# Patient Record
Sex: Male | Born: 1945 | Race: White | Hispanic: No | Smoking: Former smoker
Health system: Southern US, Community
[De-identification: ages and names within clinical notes are randomized; demographics above are authoritative.]

## PROBLEM LIST (undated history)

## (undated) DIAGNOSIS — I4891 Unspecified atrial fibrillation: Secondary | ICD-10-CM

## (undated) DIAGNOSIS — K8020 Calculus of gallbladder without cholecystitis without obstruction: Secondary | ICD-10-CM

## (undated) DIAGNOSIS — C189 Malignant neoplasm of colon, unspecified: Secondary | ICD-10-CM

## (undated) DIAGNOSIS — K766 Portal hypertension: Secondary | ICD-10-CM

## (undated) DIAGNOSIS — C801 Malignant (primary) neoplasm, unspecified: Secondary | ICD-10-CM

## (undated) DIAGNOSIS — E119 Type 2 diabetes mellitus without complications: Secondary | ICD-10-CM

## (undated) DIAGNOSIS — K7460 Unspecified cirrhosis of liver: Secondary | ICD-10-CM

## (undated) DIAGNOSIS — K31819 Angiodysplasia of stomach and duodenum without bleeding: Secondary | ICD-10-CM

## (undated) DIAGNOSIS — I2510 Atherosclerotic heart disease of native coronary artery without angina pectoris: Secondary | ICD-10-CM

## (undated) DIAGNOSIS — I8500 Esophageal varices without bleeding: Secondary | ICD-10-CM

## (undated) DIAGNOSIS — K5790 Diverticulosis of intestine, part unspecified, without perforation or abscess without bleeding: Secondary | ICD-10-CM

## (undated) DIAGNOSIS — D126 Benign neoplasm of colon, unspecified: Secondary | ICD-10-CM

## (undated) HISTORY — DX: Benign neoplasm of colon, unspecified: D12.6

## (undated) HISTORY — DX: Diverticulosis of intestine, part unspecified, without perforation or abscess without bleeding: K57.90

## (undated) HISTORY — DX: Portal hypertension: K76.6

## (undated) HISTORY — DX: Angiodysplasia of stomach and duodenum without bleeding: K31.819

## (undated) HISTORY — DX: Calculus of gallbladder without cholecystitis without obstruction: K80.20

## (undated) HISTORY — DX: Esophageal varices without bleeding: I85.00

## (undated) HISTORY — PX: CORONARY ARTERY BYPASS GRAFT: SHX141

---

## 1997-07-28 DIAGNOSIS — C189 Malignant neoplasm of colon, unspecified: Secondary | ICD-10-CM

## 1997-07-28 HISTORY — DX: Malignant neoplasm of colon, unspecified: C18.9

## 2001-07-28 HISTORY — PX: PARTIAL COLECTOMY: SHX5273

## 2008-07-28 HISTORY — PX: COLONOSCOPY: SHX5424

## 2017-06-26 IMAGING — CR DG FOOT COMPLETE 3+V*R*
3 series · 3 of 3 positions shown · non-contrast
Comparison: None.

CLINICAL DATA: Mechanical fall.  Foot pain.

EXAM:
RIGHT FOOT COMPLETE - 3+ VIEW

[foot ap]
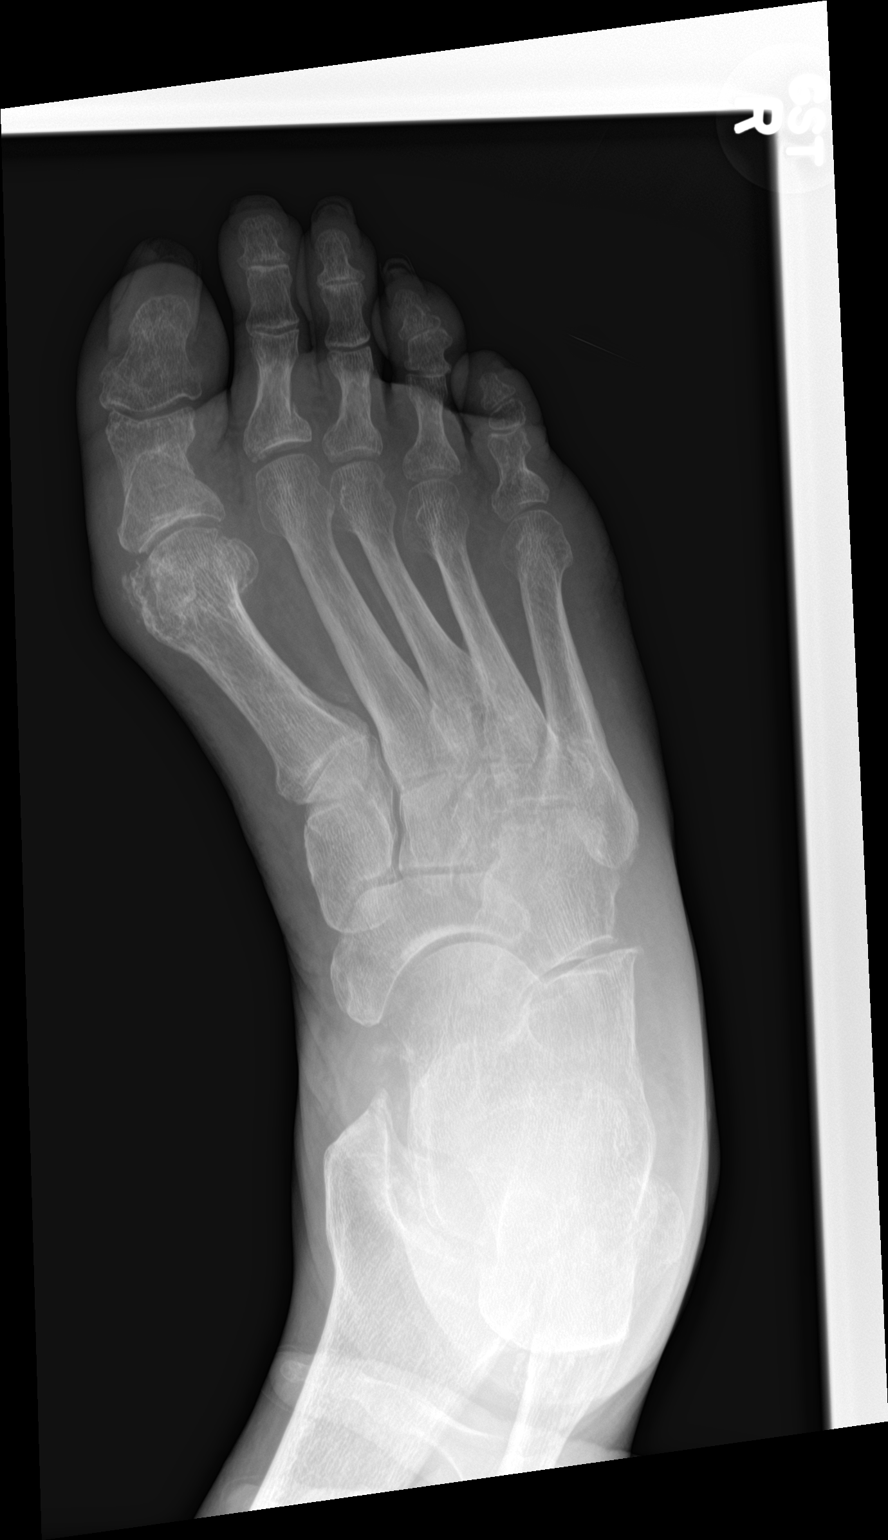

[foot obl]
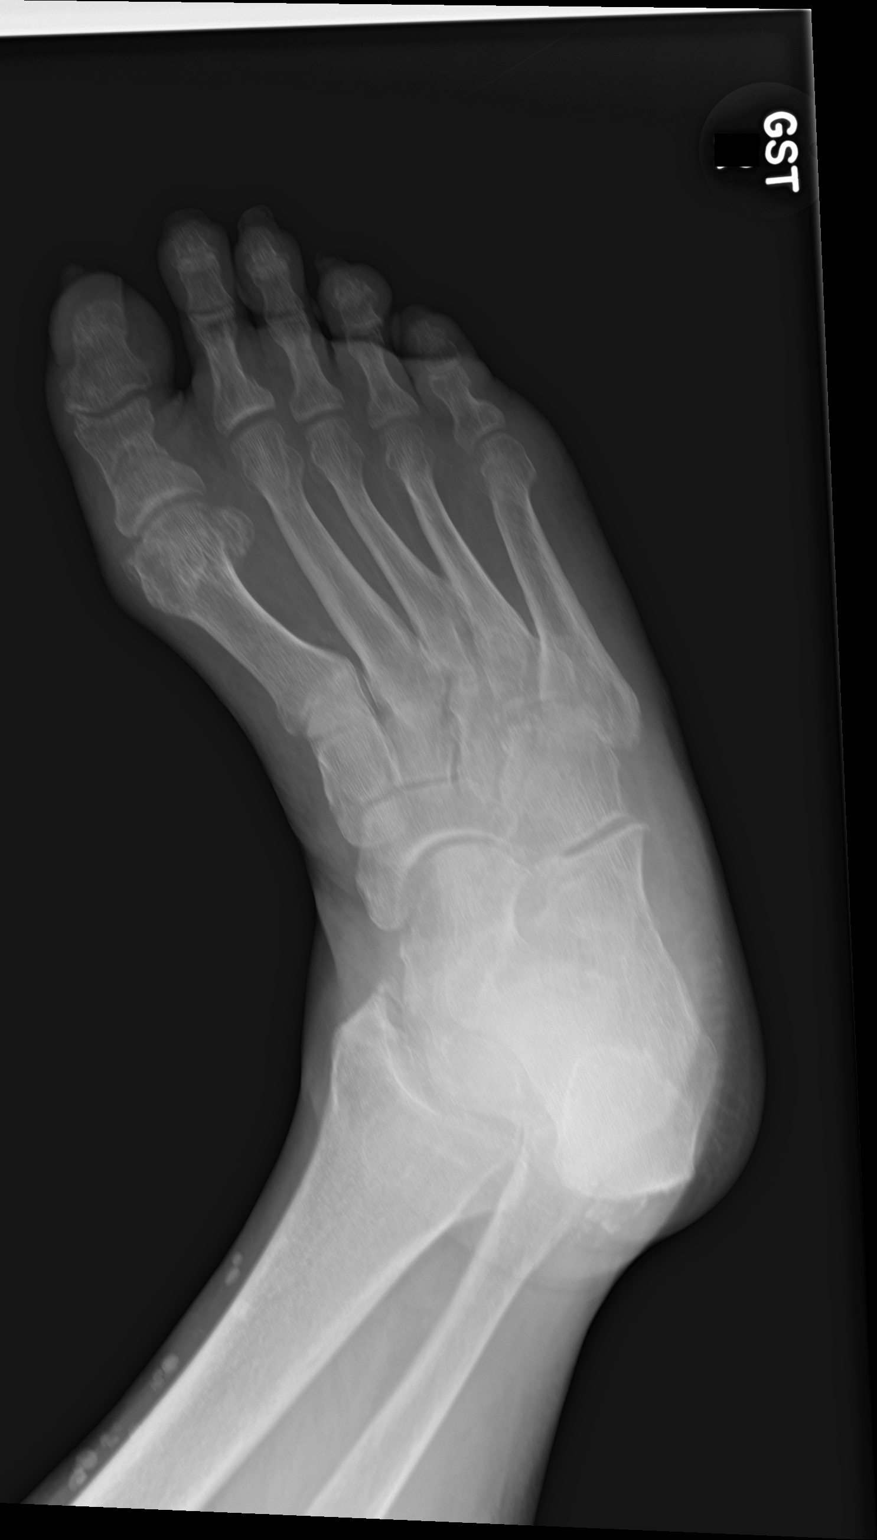

[foot lat]
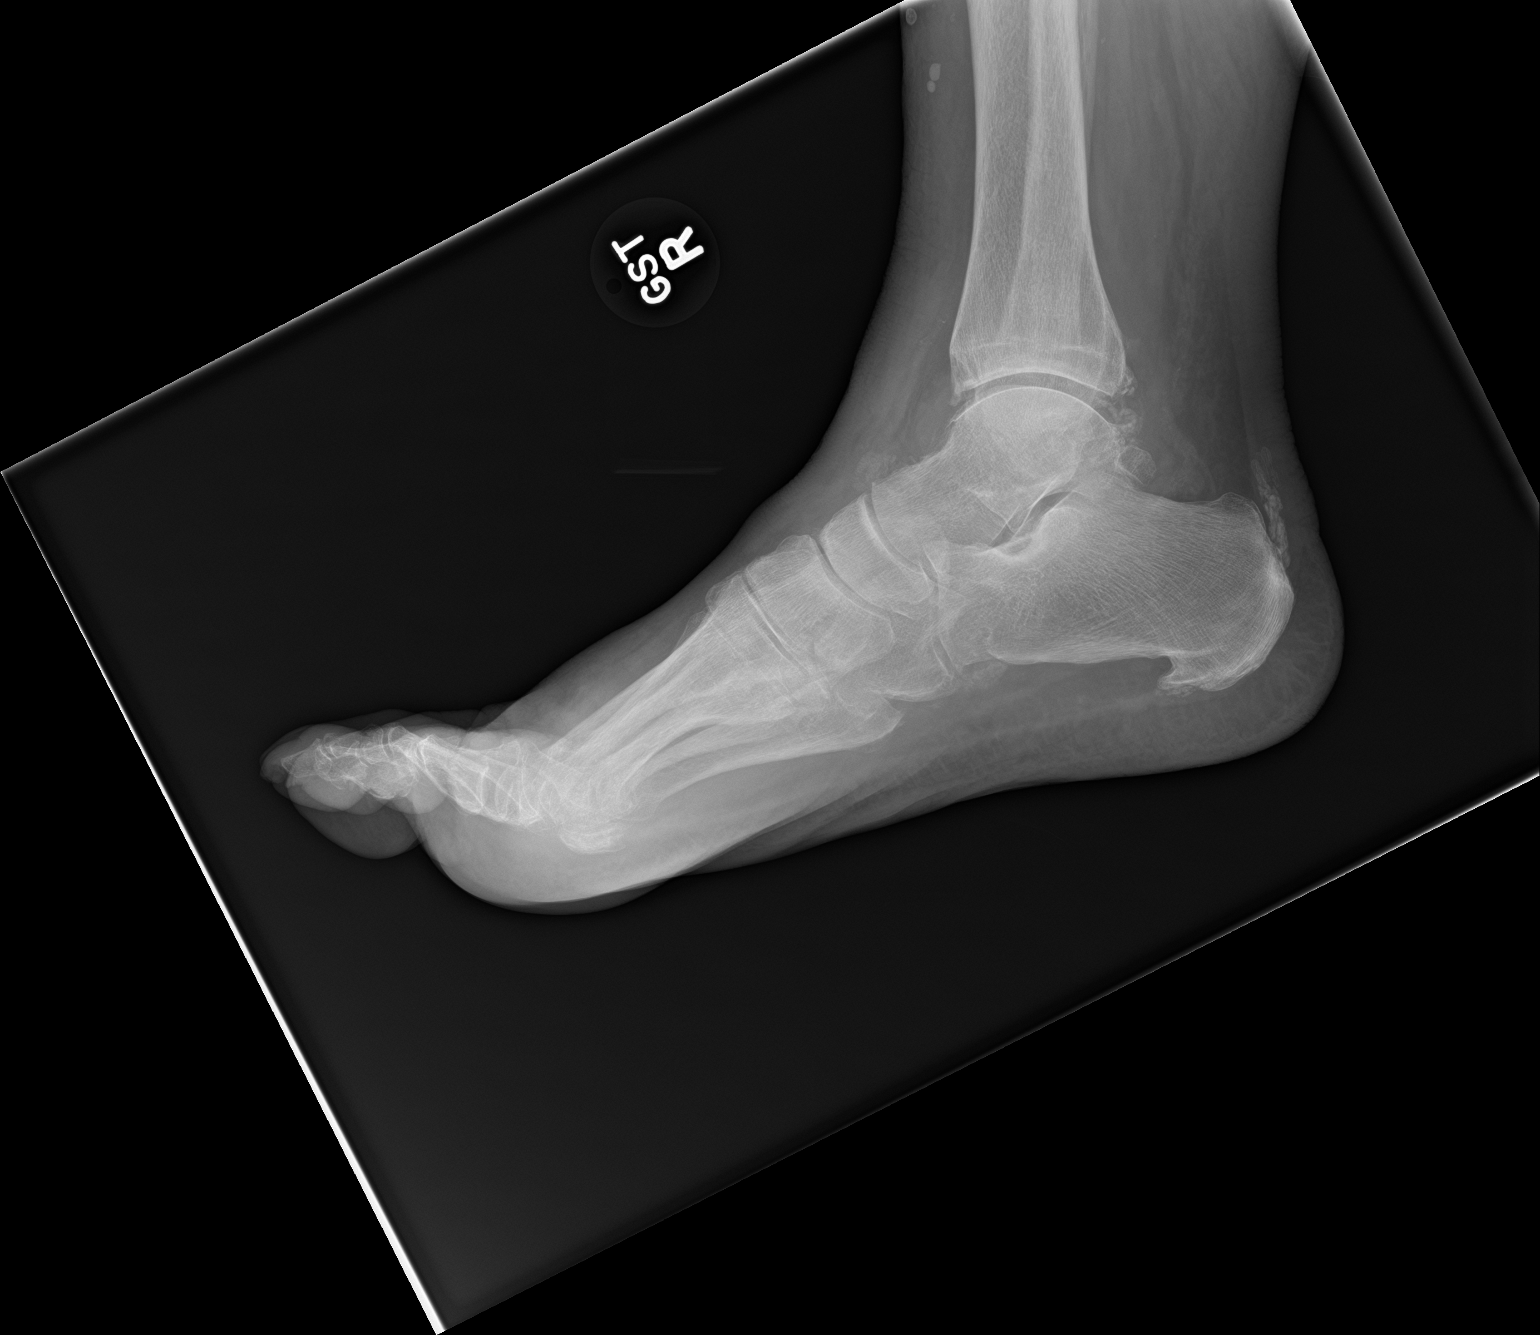

[3 of 3 positions shown; findings below may reference images not displayed]

FINDINGS: Second proximal phalanx proximal shaft fracture with minimal lateral
impaction. Nondisplaced transverse shaft fracture of the first
proximal phalanx.

Osteopenia.  Hallux valgus and bunion.
IMPRESSION: Nondisplaced first and second proximal phalanx fractures.

## 2018-08-23 ENCOUNTER — Other Ambulatory Visit: Payer: Self-pay

## 2018-08-23 ENCOUNTER — Encounter (HOSPITAL_BASED_OUTPATIENT_CLINIC_OR_DEPARTMENT_OTHER): Payer: Self-pay | Admitting: *Deleted

## 2018-08-23 ENCOUNTER — Emergency Department (HOSPITAL_BASED_OUTPATIENT_CLINIC_OR_DEPARTMENT_OTHER): Payer: Medicare Other

## 2018-08-23 ENCOUNTER — Inpatient Hospital Stay (HOSPITAL_BASED_OUTPATIENT_CLINIC_OR_DEPARTMENT_OTHER)
Admission: EM | Admit: 2018-08-23 | Discharge: 2018-08-25 | DRG: 641 | Disposition: A | Discharge status: Home Health | Payer: Medicare Other | Attending: Nephrology | Admitting: Nephrology

## 2018-08-23 DIAGNOSIS — Z8249 Family history of ischemic heart disease and other diseases of the circulatory system: Secondary | ICD-10-CM | POA: Diagnosis not present

## 2018-08-23 DIAGNOSIS — Z85038 Personal history of other malignant neoplasm of large intestine: Secondary | ICD-10-CM | POA: Diagnosis not present

## 2018-08-23 DIAGNOSIS — Z9221 Personal history of antineoplastic chemotherapy: Secondary | ICD-10-CM | POA: Diagnosis not present

## 2018-08-23 DIAGNOSIS — E860 Dehydration: Secondary | ICD-10-CM | POA: Diagnosis present

## 2018-08-23 DIAGNOSIS — K5900 Constipation, unspecified: Secondary | ICD-10-CM | POA: Diagnosis not present

## 2018-08-23 DIAGNOSIS — Z9049 Acquired absence of other specified parts of digestive tract: Secondary | ICD-10-CM

## 2018-08-23 DIAGNOSIS — K0889 Other specified disorders of teeth and supporting structures: Secondary | ICD-10-CM | POA: Diagnosis not present

## 2018-08-23 DIAGNOSIS — Z66 Do not resuscitate: Secondary | ICD-10-CM | POA: Diagnosis present

## 2018-08-23 DIAGNOSIS — Z6841 Body Mass Index (BMI) 40.0 and over, adult: Secondary | ICD-10-CM

## 2018-08-23 DIAGNOSIS — E876 Hypokalemia: Secondary | ICD-10-CM | POA: Diagnosis present

## 2018-08-23 DIAGNOSIS — Z951 Presence of aortocoronary bypass graft: Secondary | ICD-10-CM

## 2018-08-23 DIAGNOSIS — I110 Hypertensive heart disease with heart failure: Secondary | ICD-10-CM | POA: Diagnosis present

## 2018-08-23 DIAGNOSIS — E871 Hypo-osmolality and hyponatremia: Secondary | ICD-10-CM | POA: Diagnosis not present

## 2018-08-23 DIAGNOSIS — C189 Malignant neoplasm of colon, unspecified: Secondary | ICD-10-CM

## 2018-08-23 DIAGNOSIS — R531 Weakness: Secondary | ICD-10-CM | POA: Diagnosis present

## 2018-08-23 DIAGNOSIS — K439 Ventral hernia without obstruction or gangrene: Secondary | ICD-10-CM | POA: Diagnosis present

## 2018-08-23 DIAGNOSIS — Z800 Family history of malignant neoplasm of digestive organs: Secondary | ICD-10-CM

## 2018-08-23 DIAGNOSIS — N390 Urinary tract infection, site not specified: Secondary | ICD-10-CM | POA: Diagnosis present

## 2018-08-23 DIAGNOSIS — Z86010 Personal history of colonic polyps: Secondary | ICD-10-CM | POA: Diagnosis not present

## 2018-08-23 DIAGNOSIS — I509 Heart failure, unspecified: Secondary | ICD-10-CM | POA: Diagnosis present

## 2018-08-23 DIAGNOSIS — Z87891 Personal history of nicotine dependence: Secondary | ICD-10-CM | POA: Diagnosis not present

## 2018-08-23 DIAGNOSIS — I2510 Atherosclerotic heart disease of native coronary artery without angina pectoris: Secondary | ICD-10-CM | POA: Diagnosis present

## 2018-08-23 DIAGNOSIS — E119 Type 2 diabetes mellitus without complications: Secondary | ICD-10-CM

## 2018-08-23 DIAGNOSIS — R8281 Pyuria: Secondary | ICD-10-CM | POA: Diagnosis present

## 2018-08-23 DIAGNOSIS — B9561 Methicillin susceptible Staphylococcus aureus infection as the cause of diseases classified elsewhere: Secondary | ICD-10-CM | POA: Diagnosis present

## 2018-08-23 HISTORY — DX: Malignant (primary) neoplasm, unspecified: C80.1

## 2018-08-23 HISTORY — DX: Malignant neoplasm of colon, unspecified: C18.9

## 2018-08-23 HISTORY — DX: Atherosclerotic heart disease of native coronary artery without angina pectoris: I25.10

## 2018-08-23 LAB — COMPREHENSIVE METABOLIC PANEL
ALT: 13 U/L (ref 0–44)
AST: 21 U/L (ref 15–41)
Albumin: 3.7 g/dL (ref 3.5–5.0)
Alkaline Phosphatase: 47 U/L (ref 38–126)
Anion gap: 11 (ref 5–15)
BUN: 23 mg/dL (ref 8–23)
CO2: 26 mmol/L (ref 22–32)
Calcium: 9.6 mg/dL (ref 8.9–10.3)
Chloride: 85 mmol/L — ABNORMAL LOW (ref 98–111)
Creatinine, Ser: 1.02 mg/dL (ref 0.61–1.24)
GFR calc Af Amer: >60 mL/min (ref >60)
GFR calc non Af Amer: >60 mL/min (ref >60)
Glucose, Bld: 129 mg/dL — ABNORMAL HIGH (ref 70–99)
Potassium: 3.7 mmol/L (ref 3.5–5.1)
Sodium: 122 mmol/L — ABNORMAL LOW (ref 135–145)
Total Bilirubin: 1 mg/dL (ref 0.3–1.2)
Total Protein: 6.9 g/dL (ref 6.5–8.1)

## 2018-08-23 LAB — CBC WITH DIFFERENTIAL/PLATELET
Abs Immature Granulocytes: 0.05 10*3/uL (ref 0.00–0.07)
Basophils Absolute: 0.1 10*3/uL (ref 0.0–0.1)
Basophils Relative: 1 %
Eosinophils Absolute: 1 10*3/uL — ABNORMAL HIGH (ref 0.0–0.5)
Eosinophils Relative: 13 %
HCT: 37.2 % — ABNORMAL LOW (ref 39.0–52.0)
Hemoglobin: 11.7 g/dL — ABNORMAL LOW (ref 13.0–17.0)
Immature Granulocytes: 1 %
Lymphocytes Relative: 12 %
Lymphs Abs: 0.9 10*3/uL (ref 0.7–4.0)
MCH: 27.3 pg (ref 26.0–34.0)
MCHC: 31.5 g/dL (ref 30.0–36.0)
MCV: 86.9 fL (ref 80.0–100.0)
Monocytes Absolute: 0.8 10*3/uL (ref 0.1–1.0)
Monocytes Relative: 10 %
Neutro Abs: 4.9 10*3/uL (ref 1.7–7.7)
Neutrophils Relative %: 63 %
Platelets: 196 10*3/uL (ref 150–400)
RBC: 4.28 MIL/uL (ref 4.22–5.81)
RDW: 13.6 % (ref 11.5–15.5)
WBC: 7.7 10*3/uL (ref 4.0–10.5)
nRBC: 0 % (ref 0.0–0.2)

## 2018-08-23 LAB — GLUCOSE, CAPILLARY: Glucose-Capillary: 114 mg/dL — ABNORMAL HIGH (ref 70–99)

## 2018-08-23 LAB — BASIC METABOLIC PANEL
Anion gap: 12 (ref 5–15)
BUN: 20 mg/dL (ref 8–23)
CO2: 28 mmol/L (ref 22–32)
Calcium: 9.8 mg/dL (ref 8.9–10.3)
Chloride: 86 mmol/L — ABNORMAL LOW (ref 98–111)
Creatinine, Ser: 0.95 mg/dL (ref 0.61–1.24)
GFR calc Af Amer: >60 mL/min (ref >60)
GFR calc non Af Amer: >60 mL/min (ref >60)
Glucose, Bld: 115 mg/dL — ABNORMAL HIGH (ref 70–99)
Potassium: 3.5 mmol/L (ref 3.5–5.1)
Sodium: 126 mmol/L — ABNORMAL LOW (ref 135–145)

## 2018-08-23 LAB — URINALYSIS, ROUTINE W REFLEX MICROSCOPIC
Bilirubin Urine: NEGATIVE
Glucose, UA: NEGATIVE mg/dL
Ketones, ur: NEGATIVE mg/dL
Nitrite: NEGATIVE
Protein, ur: NEGATIVE mg/dL
Specific Gravity, Urine: <1.005 — ABNORMAL LOW (ref 1.005–1.030)
pH: 7.5 (ref 5.0–8.0)

## 2018-08-23 LAB — CREATININE, URINE, RANDOM: Creatinine, Urine: 45.7 mg/dL

## 2018-08-23 LAB — SODIUM, URINE, RANDOM: Sodium, Ur: 41 mmol/L

## 2018-08-23 LAB — URINALYSIS, MICROSCOPIC (REFLEX)

## 2018-08-23 LAB — PHOSPHORUS: Phosphorus: 3 mg/dL (ref 2.5–4.6)

## 2018-08-23 LAB — OSMOLALITY, URINE: Osmolality, Ur: 260 mOsm/kg — ABNORMAL LOW (ref 300–900)

## 2018-08-23 LAB — MAGNESIUM: Magnesium: 1.6 mg/dL — ABNORMAL LOW (ref 1.7–2.4)

## 2018-08-23 IMAGING — CR DG ABDOMEN ACUTE W/ 1V CHEST
5 series · 5 of 5 positions shown · non-contrast
Comparison: None.

CLINICAL DATA: Constipation. Urinary retention. Dizziness.

EXAM:
DG ABDOMEN ACUTE W/ 1V CHEST

[w chest pa]
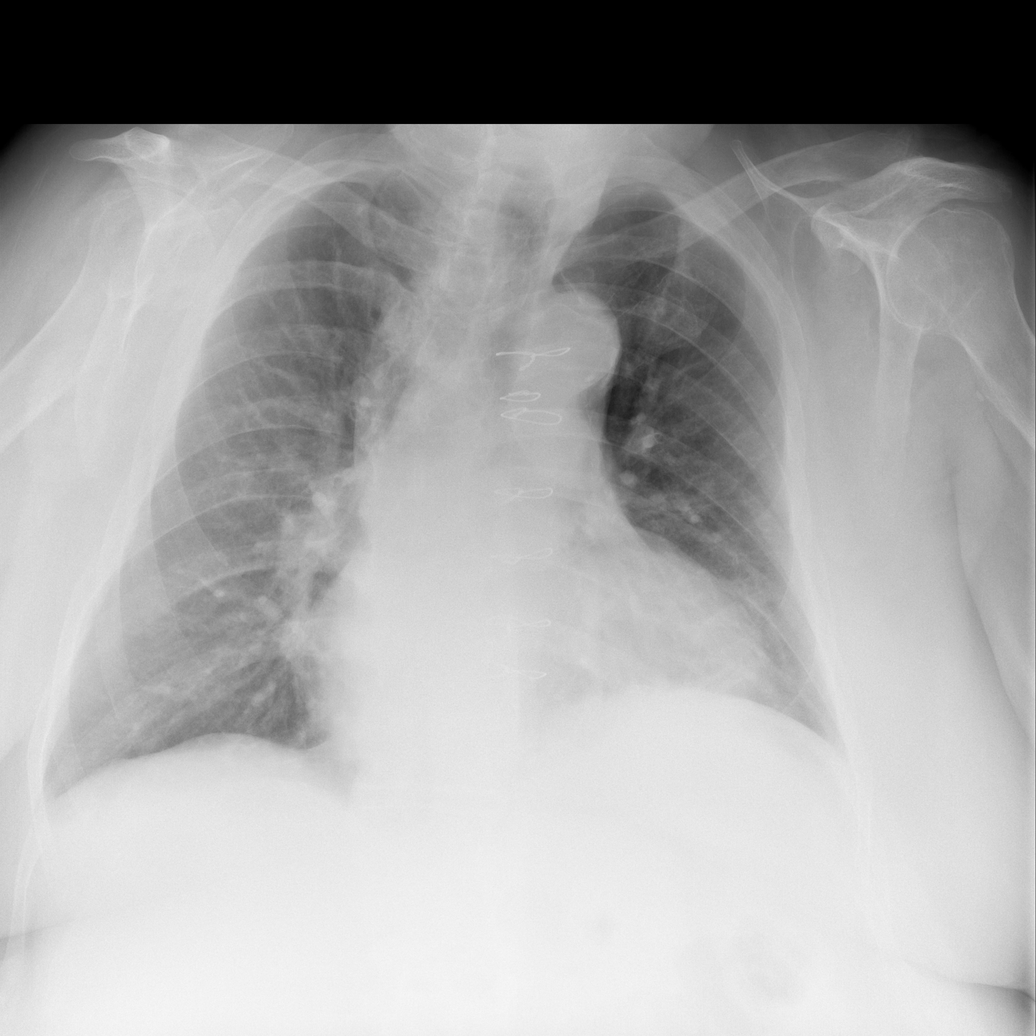

[w abdomen upright *]
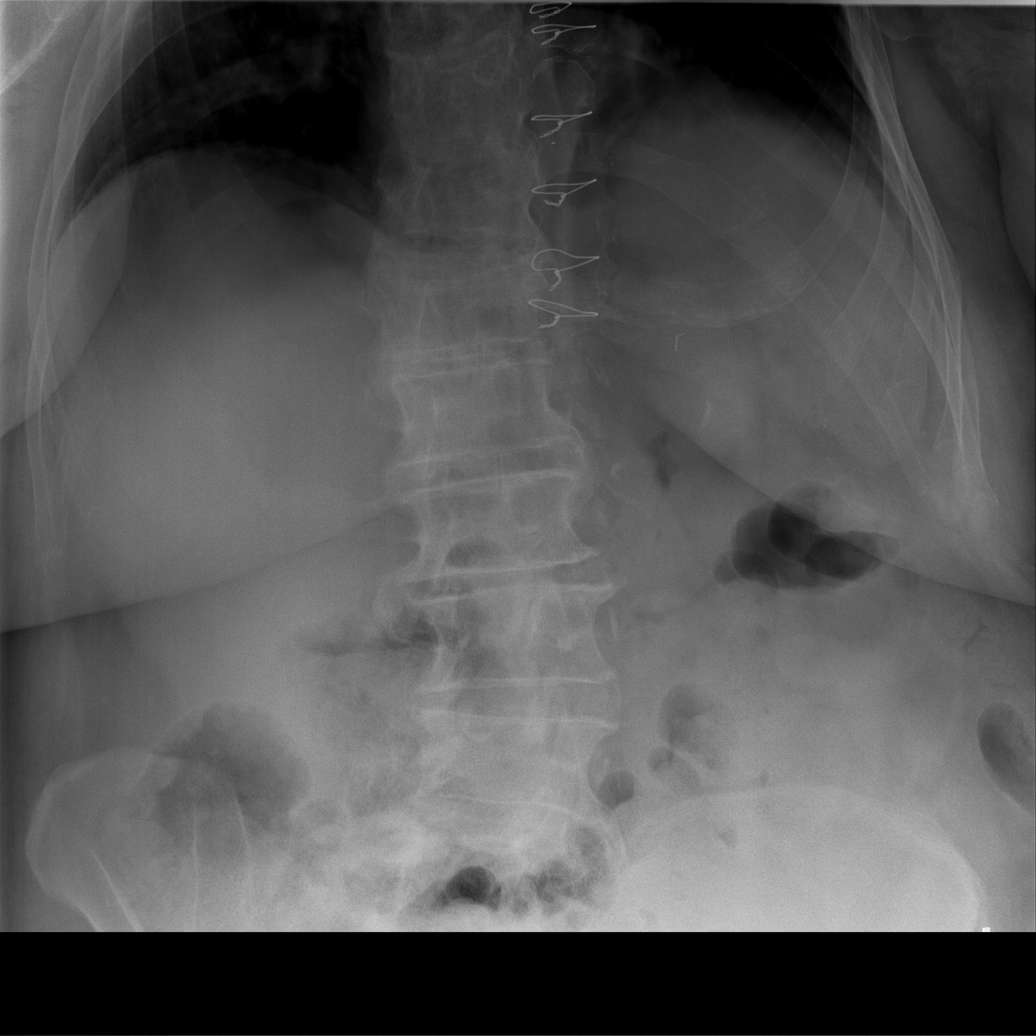

[t abdomen supine]
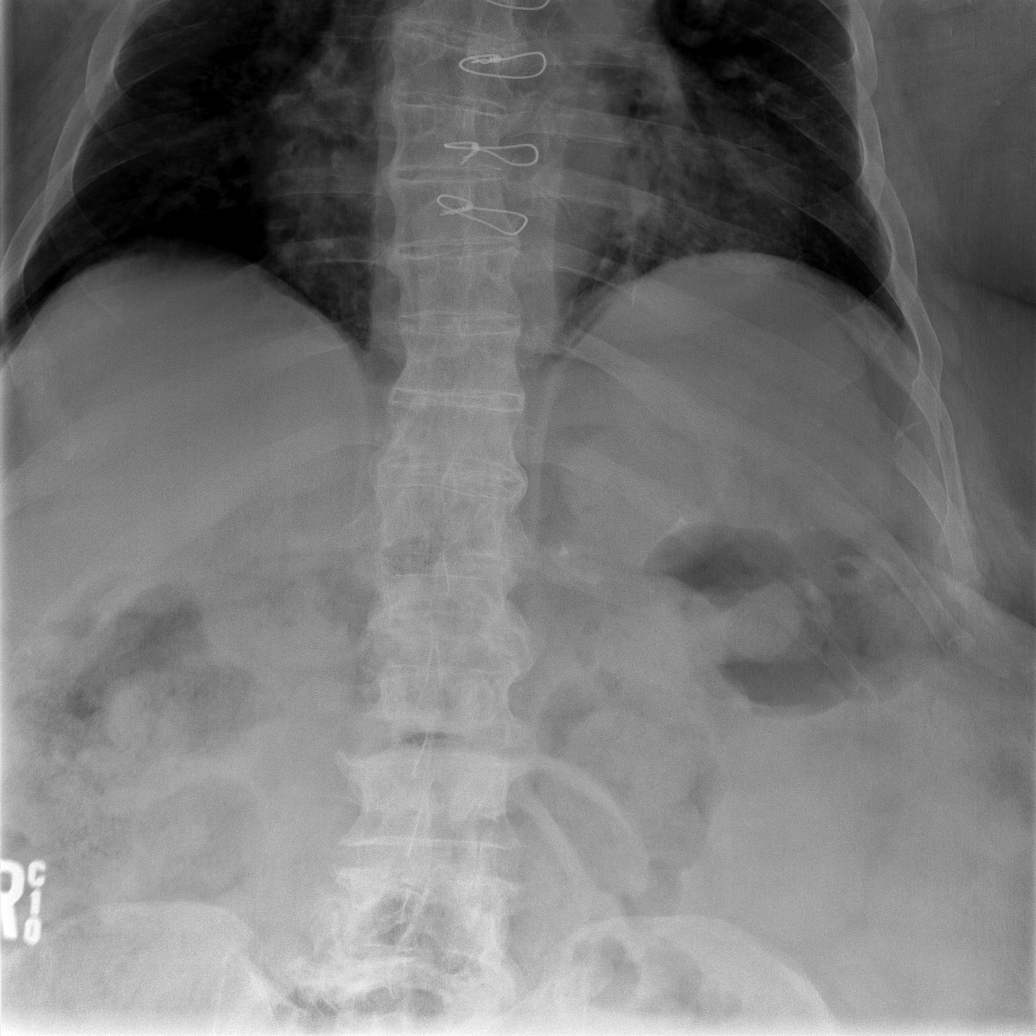

[t abdomen supine * (1 of 2)]
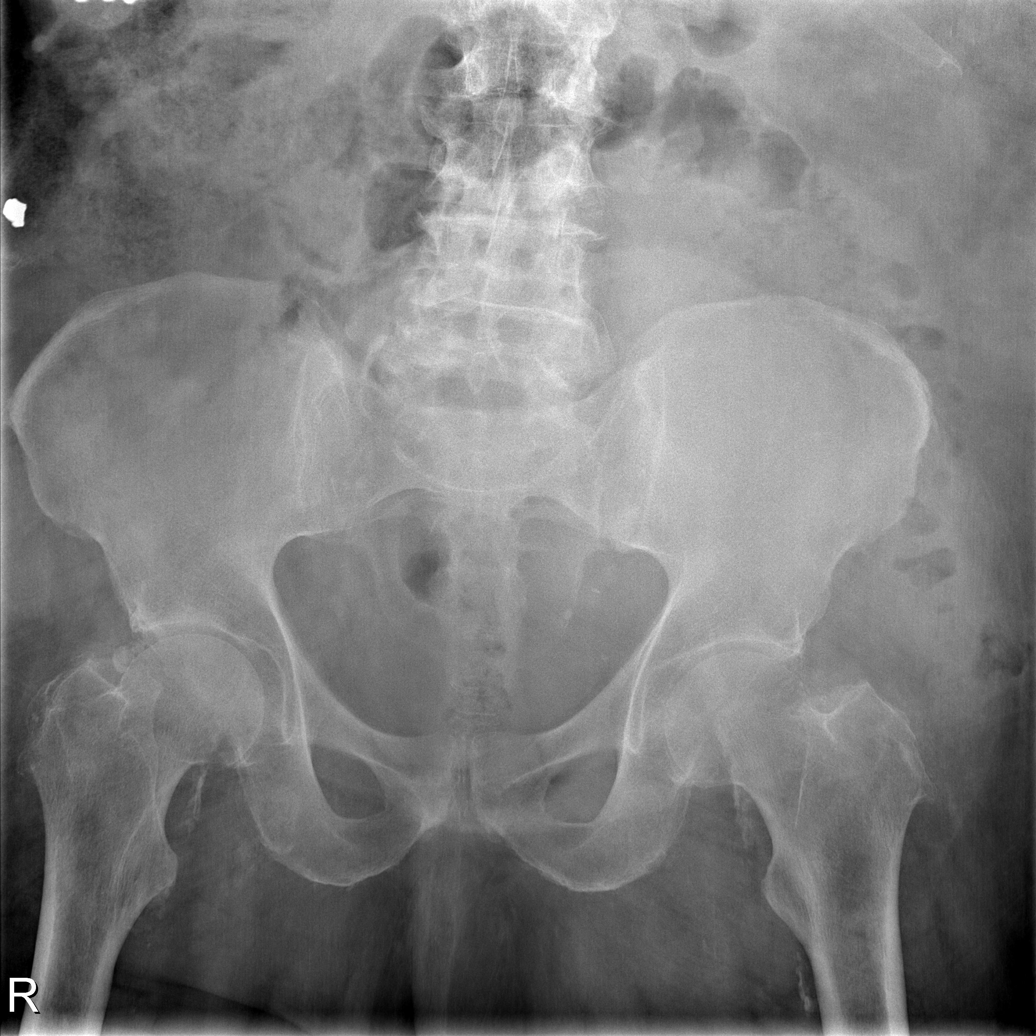

[t abdomen supine * (2 of 2)]
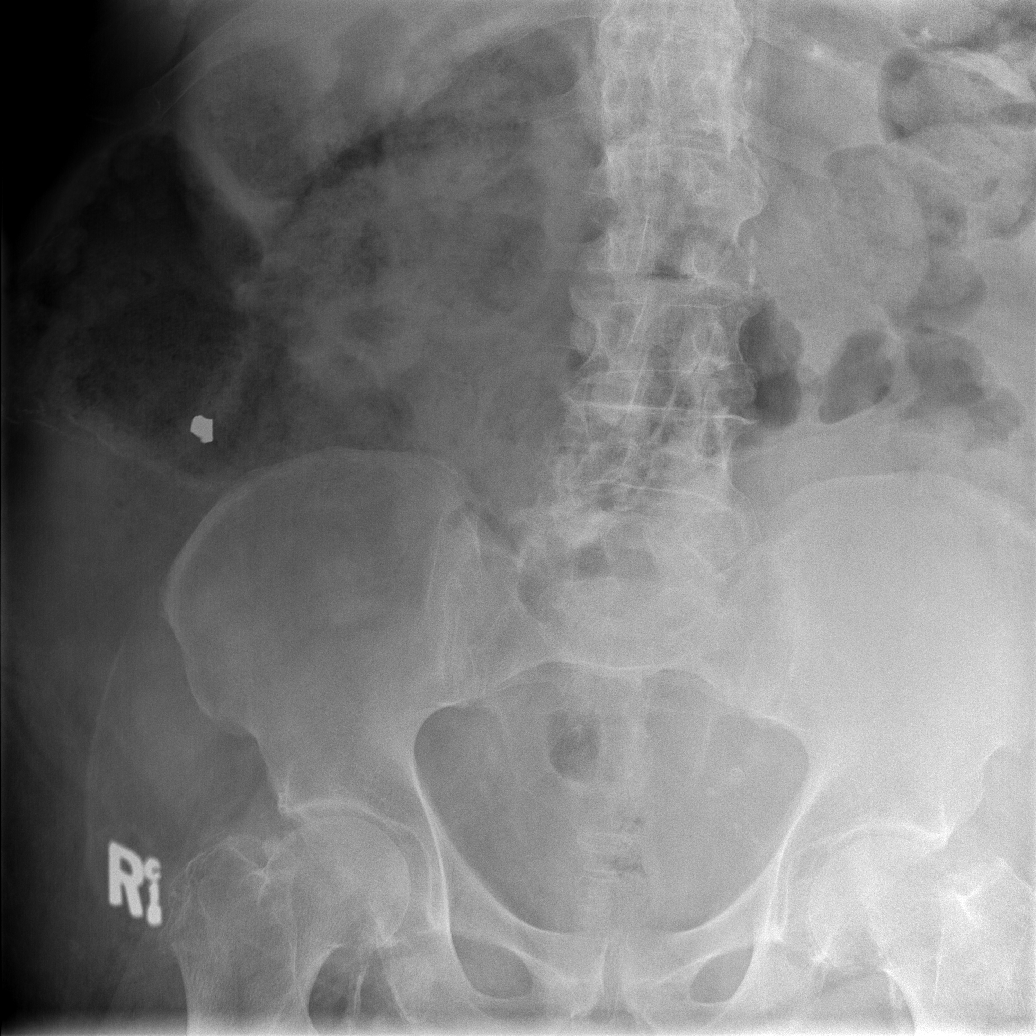

[5 of 5 positions shown; findings below may reference images not displayed]

FINDINGS: Previous median sternotomy. Cardiomegaly and aortic atherosclerosis.
Pulmonary venous hypertension without frank edema. No infiltrate,
collapse or effusion. No free air seen under the diaphragm.

Large amount of fecal matter within the colon. Small bowel pattern
is normal. No evidence of ileus or obstruction. No significant
calcifications. Ordinary chronic spinal degenerative changes. Some
metallic density material within the stool in the cecal region.
IMPRESSION: 1. Large amount of fecal matter within the colon. No evidence of
ileus, obstruction or free air.
2. Cardiomegaly and aortic atherosclerosis. Pulmonary venous
hypertension without frank edema.

## 2018-08-23 MED ORDER — ACETAMINOPHEN 325 MG PO TABS: 650.0000 mg | ORAL_TABLET | Freq: Four times a day (QID) | ORAL | Status: DC | PRN

## 2018-08-23 MED ORDER — MILK AND MOLASSES ENEMA
1.0000 | Freq: Once | RECTAL | Status: AC
  Administered 2018-08-23: 250 mL via RECTAL
  Filled 2018-08-23: qty 250

## 2018-08-23 MED ORDER — ONDANSETRON HCL 4 MG PO TABS: 4.0000 mg | ORAL_TABLET | Freq: Four times a day (QID) | ORAL | Status: DC | PRN

## 2018-08-23 MED ORDER — SODIUM CHLORIDE 0.9 % IV BOLUS
500.0000 mL | Freq: Once | INTRAVENOUS | Status: AC
  Administered 2018-08-23: 500 mL via INTRAVENOUS

## 2018-08-23 MED ORDER — SODIUM CHLORIDE 0.9 % IV SOLN
1.0000 g | INTRAVENOUS | Status: DC
  Administered 2018-08-23 – 2018-08-24 (×2): 1 g via INTRAVENOUS
  Filled 2018-08-23: qty 10
  Filled 2018-08-23 (×2): qty 1

## 2018-08-23 MED ORDER — SODIUM CHLORIDE 0.9 % IV SOLN
INTRAVENOUS | Status: AC
  Administered 2018-08-23: 21:00:00 via INTRAVENOUS

## 2018-08-23 MED ORDER — ONDANSETRON HCL 4 MG/2ML IJ SOLN: 4.0000 mg | Freq: Four times a day (QID) | INTRAMUSCULAR | Status: DC | PRN

## 2018-08-23 MED ORDER — ACETAMINOPHEN 650 MG RE SUPP: 650.0000 mg | Freq: Four times a day (QID) | RECTAL | Status: DC | PRN

## 2018-08-23 MED ORDER — INSULIN ASPART 100 UNIT/ML ~~LOC~~ SOLN
0.0000 [IU] | SUBCUTANEOUS | Status: DC
  Administered 2018-08-24: 2 [IU] via SUBCUTANEOUS
  Administered 2018-08-24 – 2018-08-25 (×5): 1 [IU] via SUBCUTANEOUS

## 2018-08-23 NOTE — ED Notes (Signed)
Pt given urinal.

## 2018-08-23 NOTE — ED Triage Notes (Signed)
Constipation, urinary retention, dizziness, fatigue for a week.

## 2018-08-23 NOTE — ED Provider Notes (Signed)
[facility] EMERGENCY DEPARTMENT Provider Note   CSN: [id number] Arrival date & time: 08/23/18  1117     History   Chief Complaint Chief Complaint  Patient presents with  . Constipation  . Urinary Retention  . Fatigue    HPI Brandon [name] is a 73 y.o. male.  Patient is a 73 year old male with a history of prior colon cancer and coronary artery disease who presents with constipation and fatigue.  He states that he has not had good bowel movements in about a week.  His last bowel movement was about 4 to 5 days ago.  He feels like he is constipated.  He denies any abdominal pain.  He does say that he has not been urinating as well as normal and he is concerned he may have a urinary tract infection.  Has had some subjective fever and chills as well as increased fatigue.  He has a decreased appetite.  He has a little bit of coughing but says it is not really different than his baseline coughing.  No shortness of breath.  He has been using over-the-counter medicines for constipation without improvement in symptoms.     Past Medical History:  Diagnosis Date  . Cancer ([place])   . Colon cancer ([place])   . Coronary artery disease     There are no active problems to display for this patient.   Past Surgical History:  Procedure Laterality Date  . CORONARY ARTERY BYPASS GRAFT          Home Medications    Prior to Admission medications   Not on File    Family History No family history on file.  Social History Social History   Tobacco Use  . Smoking status: Former Smoker    Types: Cigarettes  Substance Use Topics  . Alcohol use: Yes  . Drug use: Never     Allergies   Patient has no known allergies.   Review of Systems Review of Systems  Constitutional: Positive for appetite change and fatigue. Negative for chills, diaphoresis and fever.  HENT: Negative for congestion, rhinorrhea and sneezing.   Eyes: Negative.   Respiratory: Positive for cough. Negative  for chest tightness and shortness of breath.   Cardiovascular: Negative for chest pain and leg swelling.  Gastrointestinal: Positive for constipation and nausea. Negative for abdominal pain, blood in stool, diarrhea and vomiting.  Genitourinary: Positive for decreased urine volume. Negative for difficulty urinating, flank pain, frequency and hematuria.  Musculoskeletal: Negative for arthralgias and back pain.  Skin: Negative for rash.  Neurological: Negative for dizziness, speech difficulty, weakness, numbness and headaches.     Physical Exam Updated Vital Signs BP (!) 113/41   Pulse 73   Temp 98 F (36.7 C) (Oral)   Resp 14   Ht 5\' 7"  (1.702 m)   Wt (!) 145.2 kg   SpO2 97%   BMI 50.12 kg/m   Physical Exam Constitutional:      Appearance: He is well-developed.  HENT:     Head: Normocephalic and atraumatic.  Eyes:     Pupils: Pupils are equal, round, and reactive to light.  Neck:     Musculoskeletal: Normal range of motion and neck supple.  Cardiovascular:     Rate and Rhythm: Normal rate and regular rhythm.     Heart sounds: Normal heart sounds.  Pulmonary:     Effort: Pulmonary effort is normal. No respiratory distress.     Breath sounds: Normal breath sounds. No wheezing or  rales.  Chest:     Chest wall: No tenderness.  Abdominal:     General: Bowel sounds are normal.     Palpations: Abdomen is soft.     Tenderness: There is no abdominal tenderness. There is no guarding or rebound.     Comments: Palpable, nontender, reducible hernia to the left abdominal wall.  There is significant erythema consistent with candidal infection under his pannus as well as in his inguinal areas and upper thighs.  Genitourinary:    Comments: Soft brown stool in the vault, no impaction Musculoskeletal: Normal range of motion.     Right lower leg: Edema present.     Left lower leg: Edema present.  Lymphadenopathy:     Cervical: No cervical adenopathy.  Skin:    General: Skin is warm  and dry.     Findings: No rash.     Comments: Multiple excoriated lesions to his skin where he has been picking and scratching, no overt signs of cellulitis  Neurological:     Mental Status: He is alert and oriented to person, place, and time.      ED Treatments / Results  Labs (all labs ordered are listed, but only abnormal results are displayed) Labs Reviewed  CBC WITH DIFFERENTIAL/PLATELET - Abnormal; Notable for the following components:      Result Value   Hemoglobin 11.7 (*)    HCT 37.2 (*)    Eosinophils Absolute 1.0 (*)    All other components within normal limits  COMPREHENSIVE METABOLIC PANEL - Abnormal; Notable for the following components:   Sodium 122 (*)    Chloride 85 (*)    Glucose, Bld 129 (*)    All other components within normal limits  URINALYSIS, ROUTINE W REFLEX MICROSCOPIC  OSMOLALITY, URINE  CREATININE, URINE, RANDOM  SODIUM, URINE, RANDOM    EKG None  Radiology Dg Abd Acute 2+v W 1v Chest  Result Date: 08/23/2018 CLINICAL DATA:  Constipation. Urinary retention. Dizziness. EXAM: DG ABDOMEN ACUTE W/ 1V CHEST COMPARISON:  None. FINDINGS: Previous median sternotomy. Cardiomegaly and aortic atherosclerosis. Pulmonary venous hypertension without frank edema. No infiltrate, collapse or effusion. No free air seen under the diaphragm. Large amount of fecal matter within the colon. Small bowel pattern is normal. No evidence of ileus or obstruction. No significant calcifications. Ordinary chronic spinal degenerative changes. Some metallic density material within the stool in the cecal region. IMPRESSION: 1. Large amount of fecal matter within the colon. No evidence of ileus, obstruction or free air. 2. Cardiomegaly and aortic atherosclerosis. Pulmonary venous hypertension without frank edema. Electronically Signed   By: Nelson Chimes M.D.   On: 08/23/2018 13:13    Procedures Procedures (including critical care time)  Medications Ordered in ED Medications    sodium chloride 0.9 % bolus 500 mL (500 mLs Intravenous New Bag/Given 08/23/18 1500)     Initial Impression / Assessment and Plan / ED Course  I have reviewed the triage vital signs and the nursing notes.  Pertinent labs & imaging results that were available during my care of the patient were reviewed by me and considered in my medical decision making (see chart for details).    Patient is a 73 year old male who presents with general fatigue and weakness over the last week.  He is also had constipation.  X-ray does show evidence of constipation.  He was given a soapsuds enema with little improvement.  His labs show a markedly decreased sodium at 122.  His other labs are non-concerning.  He was started on normal saline.  He was started on saline replacement slowly given his history of CHF.  I spoke with Dr. Cyndia Skeeters who will admit the [facility] to [facility].  Final Clinical Impressions(s) / ED Diagnoses   Final diagnoses:  Hyponatremia  Constipation, unspecified constipation type    ED Discharge Orders    None       Malvin Johns, MD 08/23/18 1516

## 2018-08-23 NOTE — H&P (Signed)
Brandon [name] HYI:[id number] DOB: Mar 27, 1946 DOA: 08/23/2018     PCP: Helane Rima, MD   Outpatient Specialists:  Marlynn Perking localy    Patient arrived to ER on 08/23/18 at 1117  Patient coming from: home Lives   With family    Chief Complaint:  Chief Complaint  Patient presents with  . Constipation  . Urinary Retention  . Fatigue    HPI: Brandon [name] is a 73 y.o. male with medical history significant of colon cancer and coronary artery disease, history of CHF, DM 2, HTN    Presented to [facility] with  constipation and fatigue. Only small bowel movements for past 1 week no abdominal pain decreased urination subjective fevers and chills increased fatigue decreased appetite no shortness of breath he coughs a little bit but that his normal self.  He attempted to use over-the-counter medicines for constipation but did not seem to help Reports some discomfort with urination He has been a few days with out urination Denies any blood in stool  Regarding pertinent Chronic problems:  DM 2 - diet controlled   hx of colon cancer have not had follow up for the past 10 years due to insurance problems. Sp resection and chemotherapy in 2003 Last colonoscopy had 3 small pollips removed   CAD status post CABG  While in ER: Plain imaging showed evidence of constipation Had to use soapsuds enema with little improvement Given low sodium down to 122 was called and admission  The following Work up has been ordered so far:  Orders Placed This Encounter  Procedures  . DG ABD ACUTE 2+V W 1V CHEST  . CBC with Differential  . Urinalysis, Routine w reflex microscopic  . Comprehensive metabolic panel  . Osmolality, urine  . Creatinine, urine, random  . Sodium, urine, random  . Urinalysis, Microscopic (reflex)  . Soap suds enema  . Cardiac monitoring  . Consult to hospitalist  . Place in observation (patient's expected length of stay will be less than 2 midnights)       Following Medications were ordered in ER: Medications  sodium chloride 0.9 % bolus 500 mL (0 mLs Intravenous Stopped 08/23/18 1621)    Significant initial  Findings: Abnormal Labs Reviewed  CBC WITH DIFFERENTIAL/PLATELET - Abnormal; Notable for the following components:      Result Value   Hemoglobin 11.7 (*)    HCT 37.2 (*)    Eosinophils Absolute 1.0 (*)    All other components within normal limits  URINALYSIS, ROUTINE W REFLEX MICROSCOPIC - Abnormal; Notable for the following components:   APPearance CLOUDY (*)    Specific Gravity, Urine <1.005 (*)    Hgb urine dipstick SMALL (*)    Leukocytes, UA LARGE (*)    All other components within normal limits  COMPREHENSIVE METABOLIC PANEL - Abnormal; Notable for the following components:   Sodium 122 (*)    Chloride 85 (*)    Glucose, Bld 129 (*)    All other components within normal limits  URINALYSIS, MICROSCOPIC (REFLEX) - Abnormal; Notable for the following components:   Bacteria, UA FEW (*)    All other components within normal limits     Lactic Acid, Venous No results found for: LATICACIDVEN  Na 122 K 3.7  Cr    Stable,  Lab Results  Component Value Date   CREATININE 1.02 08/23/2018    Urine Na 41  WBC  7.7  HG/HCT  stable,      Component Value  Date/Time   HGB 11.7 (L) 08/23/2018 1224   HCT 37.2 (L) 08/23/2018 1224     Troponin (Point of Care Test) No results for input(s): TROPIPOC in the last 72 hours.       UA elevated WBC, few bacteria    KUB - obstipation  ECG: none   ED Triage Vitals  Enc Vitals Group     BP 08/23/18 1132 (!) 113/41     Pulse Rate 08/23/18 1132 73     Resp 08/23/18 1132 14     Temp 08/23/18 1132 98 F (36.7 C)     Temp Source 08/23/18 1132 Oral     SpO2 08/23/18 1132 97 %     Weight 08/23/18 1130 (!) 320 lb (145.2 kg)     Height 08/23/18 1130 5\' 7"  (1.702 m)     Head Circumference --      Peak Flow --      Pain Score 08/23/18 1130 3     Pain Loc --      Pain Edu?  --      Excl. in [place]? --   TMAX(24)@       Latest  Blood pressure (!) 155/99, pulse 70, temperature 98.2 F (36.8 C), temperature source Oral, resp. rate 18, height 5\' 7"  (1.702 m), weight (!) 145.2 kg, SpO2 97 %.    Hospitalist was called for admission for hyponatremia and obstipation   Review of Systems:    Pertinent positives include:  Fevers, chills, fatigue,  Constitutional:  No weight loss, night sweats, weight loss  HEENT:  No headaches, Difficulty swallowing,Tooth/dental problems,Sore throat,  No sneezing, itching, ear ache, nasal congestion, post nasal drip,  Cardio-vascular:  No chest pain, Orthopnea, PND, anasarca, dizziness, palpitations.no Bilateral lower extremity swelling  GI:  No heartburn, indigestion, abdominal pain, nausea, vomiting, diarrhea, change in bowel habits, loss of appetite, melena, blood in stool, hematemesis Resp:  no shortness of breath at rest. No dyspnea on exertion, No excess mucus, no productive cough, No non-productive cough, No coughing up of blood.No change in color of mucus.No wheezing. Skin:  no rash or lesions. No jaundice GU:  no dysuria, change in color of urine, no urgency or frequency. No straining to urinate.  No flank pain.  Musculoskeletal:  No joint pain or no joint swelling. No decreased range of motion. No back pain.  Psych:  No change in mood or affect. No depression or anxiety. No memory loss.  Neuro: no localizing neurological complaints, no tingling, no weakness, no double vision, no gait abnormality, no slurred speech, no confusion  All systems reviewed and apart from [place] all are negative  Past Medical History:   Past Medical History:  Diagnosis Date  . Cancer ([place])   . Colon cancer ([place])   . Coronary artery disease       Past Surgical History:  Procedure Laterality Date  . CORONARY ARTERY BYPASS GRAFT      Social History:  Ambulatory  cane, walker       reports that he has quit smoking. His smoking  use included cigarettes. He does not have any smokeless tobacco history on file. He reports current alcohol use. He reports that he does not use drugs.   Family History:   Family History  Problem Relation Age of Onset  . Colon cancer Father   . CAD Other   . Colon cancer Other   . Diabetes Neg Hx     Allergies: No Known Allergies   Prior  to Admission medications   Not on File   Physical Exam: Blood pressure (!) 155/99, pulse 70, temperature 98.2 F (36.8 C), temperature source Oral, resp. rate 18, height 5\' 7"  (1.702 m), weight (!) 145.2 kg, SpO2 97 %. 1. General:  in No Acute distress   * Chronically ill  -appearing 2. Psychological: Alert and  Oriented 3. Head/ENT:     Dry Mucous Membranes                          Head Non traumatic, neck supple                         Poor Dentition 4. SKIN:   decreased Skin turgor,  Skin clean Dry multiple excoriations noted 5. Heart: Regular rate and rhythm no Murmur, no Rub or gallop 6. Lungs:  Clear to auscultation bilaterally, no wheezes or crackles   7. Abdomen: Soft,  non-tender, Non distended   obese  bowel sounds present 8. Lower extremities: no clubbing, cyanosis, no edema 9. Neurologically Grossly intact, moving all 4 extremities equally  10. MSK: Normal range of motion   LABS:     Recent Labs  Lab 08/23/18 1224  WBC 7.7  NEUTROABS 4.9  HGB 11.7*  HCT 37.2*  MCV 86.9  PLT 742   Basic Metabolic Panel: Recent Labs  Lab 08/23/18 1358  NA 122*  K 3.7  CL 85*  CO2 26  GLUCOSE 129*  BUN 23  CREATININE 1.02  CALCIUM 9.6      Recent Labs  Lab 08/23/18 1358  AST 21  ALT 13  ALKPHOS 47  BILITOT 1.0  PROT 6.9  ALBUMIN 3.7   No results for input(s): LIPASE, AMYLASE in the last 168 hours. No results for input(s): AMMONIA in the last 168 hours.    HbA1C: No results for input(s): HGBA1C in the last 72 hours. CBG: No results for input(s): GLUCAP in the last 168 hours.    Urine analysis:    Component  Value Date/Time   COLORURINE YELLOW 08/23/2018 1225   APPEARANCEUR CLOUDY (A) 08/23/2018 1225   LABSPEC <1.005 (L) 08/23/2018 1225   PHURINE 7.5 08/23/2018 1225   GLUCOSEU NEGATIVE 08/23/2018 1225   HGBUR SMALL (A) 08/23/2018 1225   BILIRUBINUR NEGATIVE 08/23/2018 1225   KETONESUR NEGATIVE 08/23/2018 1225   PROTEINUR NEGATIVE 08/23/2018 1225   NITRITE NEGATIVE 08/23/2018 1225   LEUKOCYTESUR LARGE (A) 08/23/2018 1225     Cultures: No results found for: SDES, SPECREQUEST, CULT, REPTSTATUS   Radiological Exams on Admission: Dg Abd Acute 2+v W 1v Chest  Result Date: 08/23/2018 CLINICAL DATA:  Constipation. Urinary retention. Dizziness. EXAM: DG ABDOMEN ACUTE W/ 1V CHEST COMPARISON:  None. FINDINGS: Previous median sternotomy. Cardiomegaly and aortic atherosclerosis. Pulmonary venous hypertension without frank edema. No infiltrate, collapse or effusion. No free air seen under the diaphragm. Large amount of fecal matter within the colon. Small bowel pattern is normal. No evidence of ileus or obstruction. No significant calcifications. Ordinary chronic spinal degenerative changes. Some metallic density material within the stool in the cecal region. IMPRESSION: 1. Large amount of fecal matter within the colon. No evidence of ileus, obstruction or free air. 2. Cardiomegaly and aortic atherosclerosis. Pulmonary venous hypertension without frank edema. Electronically Signed   By: Nelson Chimes M.D.   On: 08/23/2018 13:13    Chart has been reviewed    Assessment/Plan   73 y.o. male with medical  history significant of colon cancer and coronary artery disease, history of CHF, DM 2, HTN Admitted for hyponatremia dehydration and obstipation  Present on Admission: . Hyponatremia likely secondary to dehydration patient reports decreased p.o. intake years appears to be dry with dry mucous membranes.  Obtain urine electrolytes Gently rehydrate and follow Repeat labs . Obstipation order milk and molasses  enema to see if there is any improvement,  . Colon cancer [facility]) patient will need to resume follow-up If no improvement despite repeated enemas Will likely need GI consult if no improvement with repeated enemas. Will need set up for colonoscopy May need further imaging if there is no resolution of obstipation and will develop significant abdominal discomfort . CAD (coronary artery disease) currently stable continue to monitor . Pyuria given symptoms related with dysuria will treat with   antibiotics for now await results of urine cultures Dm 2 - - Order Sensitive SSI  Diet controlled at baseline  -  check TSH and HgA1C      Other plan as per orders.  DVT prophylaxis:  SCD   Code Status:    DNR/DNI as per patient   I had personally discussed CODE STATUS with patient   Family Communication:   Family not at  Bedside    Disposition Plan:     To home once workup is complete and patient is stable   Would benefit from PT/OT eval prior to DC  Ordered                                      Consults called: none   Admission status:   Obs     Level of care  tele  For 12H      Lise Pincus 08/23/2018, 10:10 PM    [facility]     after 2 AM please page floor coverage PA If 7AM-7PM, please contact the day team taking care of the patient using [email or web address]

## 2018-08-23 NOTE — Progress Notes (Signed)
Milk and molasses enema given this evening. Pt was able to take in about 3/4 of the fluid. Held the amount for 10 min. No stool was noted in the [facility]. Pt states he still feels as though he needs to have a bowel movement.

## 2018-08-23 NOTE — ED Notes (Signed)
Report given to [facility]. 

## 2018-08-23 NOTE — ED Notes (Addendum)
ED TO INPATIENT HANDOFF REPORT  Report given to Shanon Brow, [place] [facility] RN. All questions answered at this time.

## 2018-08-23 NOTE — ED Notes (Signed)
Pt asked about obtaining urine. Pt states that he is unable to provide at this time. RN will continue to monitor.

## 2018-08-23 NOTE — ED Notes (Signed)
After giving a soap suds enema, pt only released brown watery fluid

## 2018-08-24 ENCOUNTER — Observation Stay (HOSPITAL_COMMUNITY): Payer: Medicare Other

## 2018-08-24 DIAGNOSIS — E871 Hypo-osmolality and hyponatremia: Secondary | ICD-10-CM | POA: Diagnosis present

## 2018-08-24 DIAGNOSIS — Z87891 Personal history of nicotine dependence: Secondary | ICD-10-CM | POA: Diagnosis not present

## 2018-08-24 DIAGNOSIS — N3000 Acute cystitis without hematuria: Secondary | ICD-10-CM | POA: Diagnosis not present

## 2018-08-24 DIAGNOSIS — Z9221 Personal history of antineoplastic chemotherapy: Secondary | ICD-10-CM | POA: Diagnosis not present

## 2018-08-24 DIAGNOSIS — Z6841 Body Mass Index (BMI) 40.0 and over, adult: Secondary | ICD-10-CM | POA: Diagnosis not present

## 2018-08-24 DIAGNOSIS — E876 Hypokalemia: Secondary | ICD-10-CM | POA: Diagnosis present

## 2018-08-24 DIAGNOSIS — Z8249 Family history of ischemic heart disease and other diseases of the circulatory system: Secondary | ICD-10-CM | POA: Diagnosis not present

## 2018-08-24 DIAGNOSIS — Z951 Presence of aortocoronary bypass graft: Secondary | ICD-10-CM | POA: Diagnosis not present

## 2018-08-24 DIAGNOSIS — I2510 Atherosclerotic heart disease of native coronary artery without angina pectoris: Secondary | ICD-10-CM | POA: Diagnosis present

## 2018-08-24 DIAGNOSIS — N390 Urinary tract infection, site not specified: Secondary | ICD-10-CM | POA: Diagnosis present

## 2018-08-24 DIAGNOSIS — R531 Weakness: Secondary | ICD-10-CM | POA: Diagnosis present

## 2018-08-24 DIAGNOSIS — Z85038 Personal history of other malignant neoplasm of large intestine: Secondary | ICD-10-CM | POA: Diagnosis not present

## 2018-08-24 DIAGNOSIS — R8281 Pyuria: Secondary | ICD-10-CM | POA: Diagnosis not present

## 2018-08-24 DIAGNOSIS — I509 Heart failure, unspecified: Secondary | ICD-10-CM | POA: Diagnosis present

## 2018-08-24 DIAGNOSIS — B9561 Methicillin susceptible Staphylococcus aureus infection as the cause of diseases classified elsewhere: Secondary | ICD-10-CM | POA: Diagnosis present

## 2018-08-24 DIAGNOSIS — I110 Hypertensive heart disease with heart failure: Secondary | ICD-10-CM | POA: Diagnosis present

## 2018-08-24 DIAGNOSIS — K439 Ventral hernia without obstruction or gangrene: Secondary | ICD-10-CM | POA: Diagnosis present

## 2018-08-24 DIAGNOSIS — E860 Dehydration: Secondary | ICD-10-CM | POA: Diagnosis present

## 2018-08-24 DIAGNOSIS — K5900 Constipation, unspecified: Secondary | ICD-10-CM | POA: Diagnosis present

## 2018-08-24 DIAGNOSIS — Z66 Do not resuscitate: Secondary | ICD-10-CM | POA: Diagnosis present

## 2018-08-24 DIAGNOSIS — Z9049 Acquired absence of other specified parts of digestive tract: Secondary | ICD-10-CM | POA: Diagnosis not present

## 2018-08-24 DIAGNOSIS — E119 Type 2 diabetes mellitus without complications: Secondary | ICD-10-CM | POA: Diagnosis present

## 2018-08-24 DIAGNOSIS — Z86010 Personal history of colonic polyps: Secondary | ICD-10-CM | POA: Diagnosis not present

## 2018-08-24 DIAGNOSIS — C189 Malignant neoplasm of colon, unspecified: Secondary | ICD-10-CM | POA: Diagnosis not present

## 2018-08-24 DIAGNOSIS — Z800 Family history of malignant neoplasm of digestive organs: Secondary | ICD-10-CM | POA: Diagnosis not present

## 2018-08-24 DIAGNOSIS — K0889 Other specified disorders of teeth and supporting structures: Secondary | ICD-10-CM | POA: Diagnosis present

## 2018-08-24 LAB — GLUCOSE, CAPILLARY
Glucose-Capillary: 103 mg/dL — ABNORMAL HIGH (ref 70–99)
Glucose-Capillary: 106 mg/dL — ABNORMAL HIGH (ref 70–99)
Glucose-Capillary: 120 mg/dL — ABNORMAL HIGH (ref 70–99)
Glucose-Capillary: 125 mg/dL — ABNORMAL HIGH (ref 70–99)
Glucose-Capillary: 127 mg/dL — ABNORMAL HIGH (ref 70–99)
Glucose-Capillary: 144 mg/dL — ABNORMAL HIGH (ref 70–99)
Glucose-Capillary: 157 mg/dL — ABNORMAL HIGH (ref 70–99)

## 2018-08-24 LAB — BASIC METABOLIC PANEL
Anion gap: 11 (ref 5–15)
BUN: 18 mg/dL (ref 8–23)
CO2: 29 mmol/L (ref 22–32)
Calcium: 9.6 mg/dL (ref 8.9–10.3)
Chloride: 88 mmol/L — ABNORMAL LOW (ref 98–111)
Creatinine, Ser: 0.97 mg/dL (ref 0.61–1.24)
GFR calc Af Amer: >60 mL/min (ref >60)
GFR calc non Af Amer: >60 mL/min (ref >60)
Glucose, Bld: 119 mg/dL — ABNORMAL HIGH (ref 70–99)
Potassium: 3.4 mmol/L — ABNORMAL LOW (ref 3.5–5.1)
Sodium: 128 mmol/L — ABNORMAL LOW (ref 135–145)

## 2018-08-24 LAB — COMPREHENSIVE METABOLIC PANEL
ALT: 14 U/L (ref 0–44)
AST: 20 U/L (ref 15–41)
Albumin: 3.3 g/dL — ABNORMAL LOW (ref 3.5–5.0)
Alkaline Phosphatase: 42 U/L (ref 38–126)
Anion gap: 12 (ref 5–15)
BUN: 18 mg/dL (ref 8–23)
CO2: 27 mmol/L (ref 22–32)
Calcium: 9.5 mg/dL (ref 8.9–10.3)
Chloride: 88 mmol/L — ABNORMAL LOW (ref 98–111)
Creatinine, Ser: 0.97 mg/dL (ref 0.61–1.24)
GFR calc Af Amer: >60 mL/min (ref >60)
GFR calc non Af Amer: >60 mL/min (ref >60)
Glucose, Bld: 117 mg/dL — ABNORMAL HIGH (ref 70–99)
Potassium: 3.3 mmol/L — ABNORMAL LOW (ref 3.5–5.1)
Sodium: 127 mmol/L — ABNORMAL LOW (ref 135–145)
Total Bilirubin: 0.6 mg/dL (ref 0.3–1.2)
Total Protein: 6.3 g/dL — ABNORMAL LOW (ref 6.5–8.1)

## 2018-08-24 LAB — CBC
HCT: 36.1 % — ABNORMAL LOW (ref 39.0–52.0)
Hemoglobin: 11.3 g/dL — ABNORMAL LOW (ref 13.0–17.0)
MCH: 27.8 pg (ref 26.0–34.0)
MCHC: 31.3 g/dL (ref 30.0–36.0)
MCV: 88.9 fL (ref 80.0–100.0)
Platelets: 172 10*3/uL (ref 150–400)
RBC: 4.06 MIL/uL — ABNORMAL LOW (ref 4.22–5.81)
RDW: 13.7 % (ref 11.5–15.5)
WBC: 8.6 10*3/uL (ref 4.0–10.5)
nRBC: 0 % (ref 0.0–0.2)

## 2018-08-24 LAB — HEMOGLOBIN A1C
Hgb A1c MFr Bld: 7.2 % — ABNORMAL HIGH (ref 4.8–5.6)
Mean Plasma Glucose: 159.94 mg/dL

## 2018-08-24 LAB — OSMOLALITY: Osmolality: 272 mOsm/kg — ABNORMAL LOW (ref 275–295)

## 2018-08-24 LAB — TSH: TSH: 2.214 u[IU]/mL (ref 0.350–4.500)

## 2018-08-24 LAB — PHOSPHORUS: Phosphorus: 2.6 mg/dL (ref 2.5–4.6)

## 2018-08-24 LAB — MAGNESIUM: Magnesium: 1.5 mg/dL — ABNORMAL LOW (ref 1.7–2.4)

## 2018-08-24 IMAGING — CT CT ABD-PELV W/ CM
2 of 5 series · 16 of 46 positions shown, 18 images · IV contrast (omnipaque)
Comparison: [DATE]

CLINICAL DATA: Abdominal distension and pain, initial encounter

EXAM:
CT ABDOMEN AND PELVIS WITH CONTRAST
TECHNIQUE: Multidetector CT imaging of the abdomen and pelvis was performed
using the standard protocol following bolus administration of
intravenous contrast.
CONTRAST:  100mL OMNIPAQUE 300

[Series 2: axial st · axial · 0.98mm/px · z∈[-406,+34]mm · 13 of 102 slices shown, 15 images]
[im 7/102  soft-tissue]
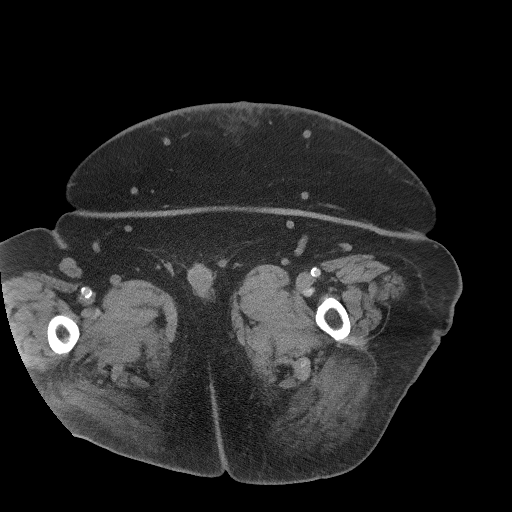
[im 7/102  bone]
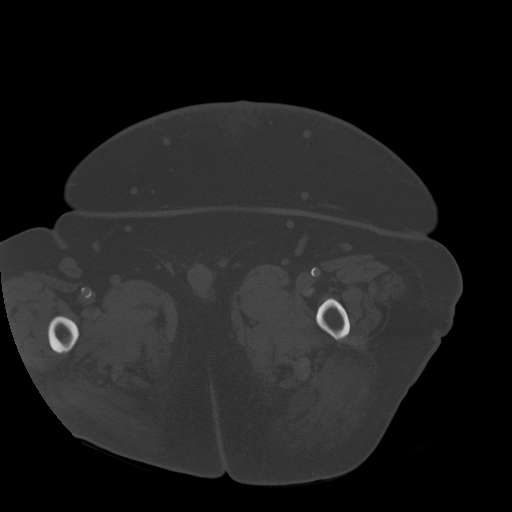
[im 13/102  soft-tissue]
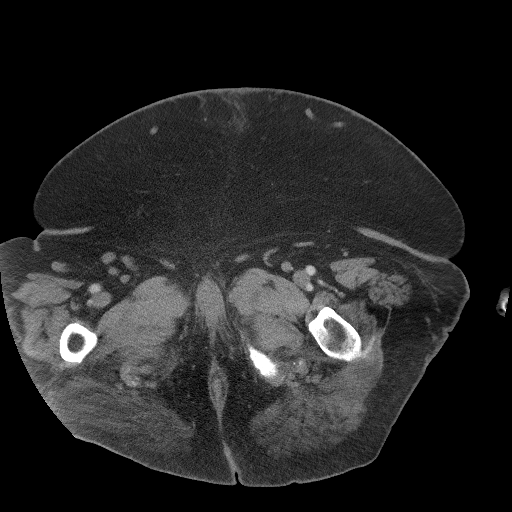
[im 19/102  soft-tissue]
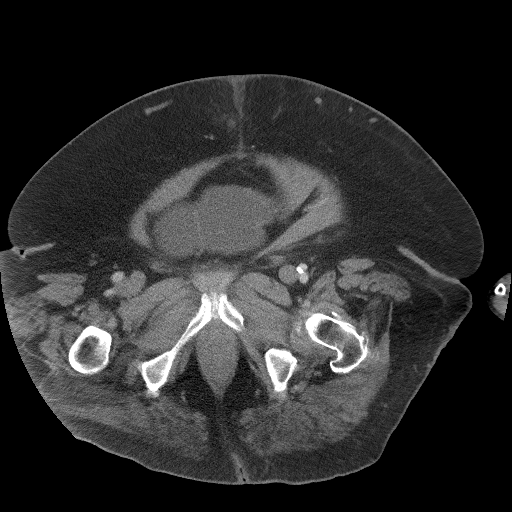
[im 32/102  soft-tissue]
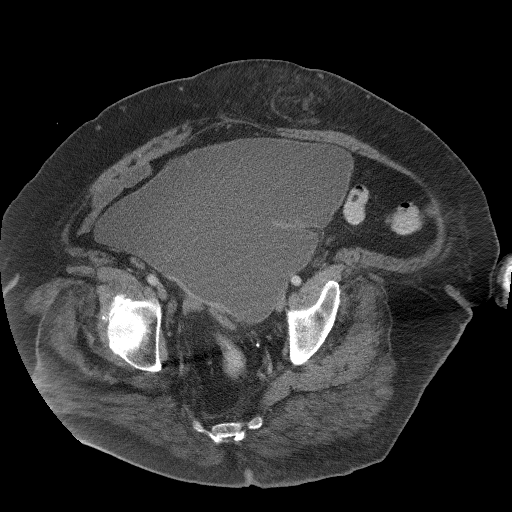
[im 38/102  soft-tissue]
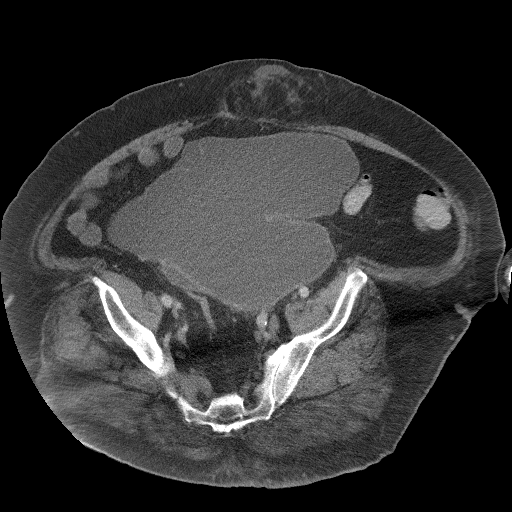
[im 45/102  soft-tissue]
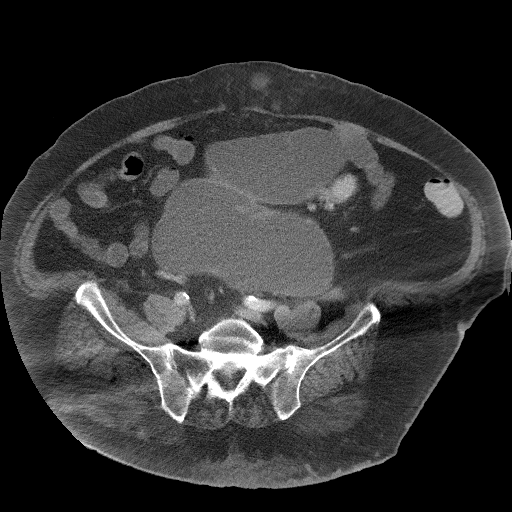
[im 51/102  soft-tissue]
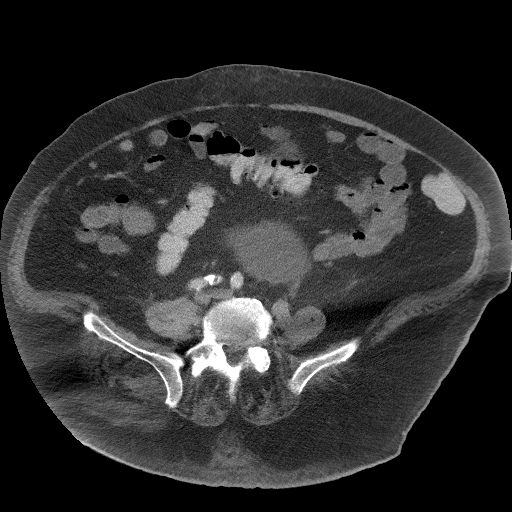
[im 57/102  soft-tissue]
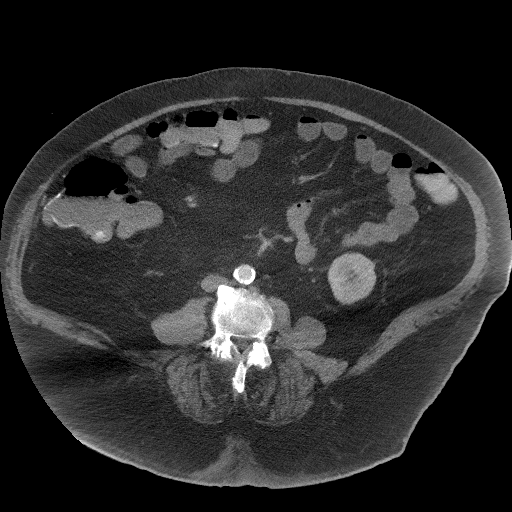
[im 64/102  soft-tissue]
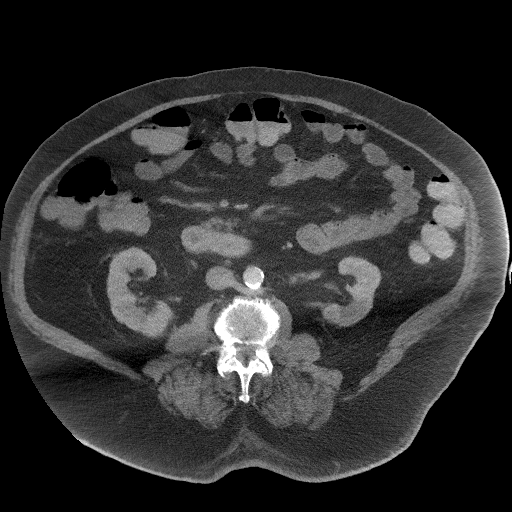
[im 64/102  bone]
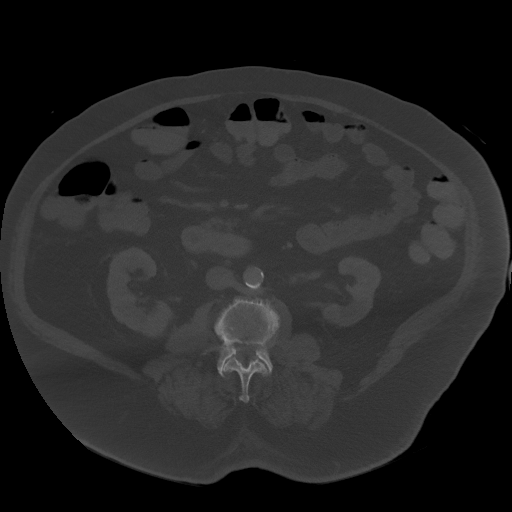
[im 70/102  soft-tissue]
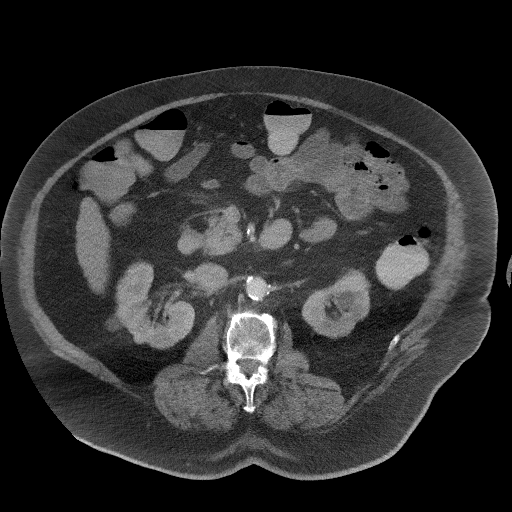
[im 83/102  soft-tissue]
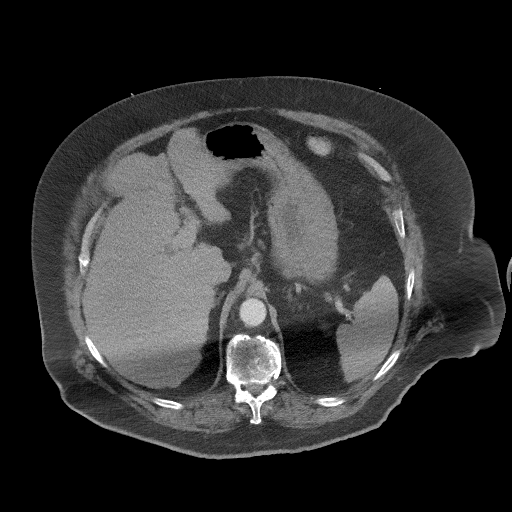
[im 89/102  soft-tissue]
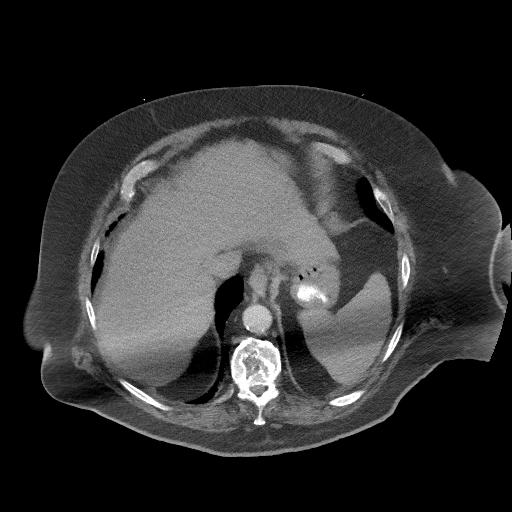
[im 95/102  soft-tissue]
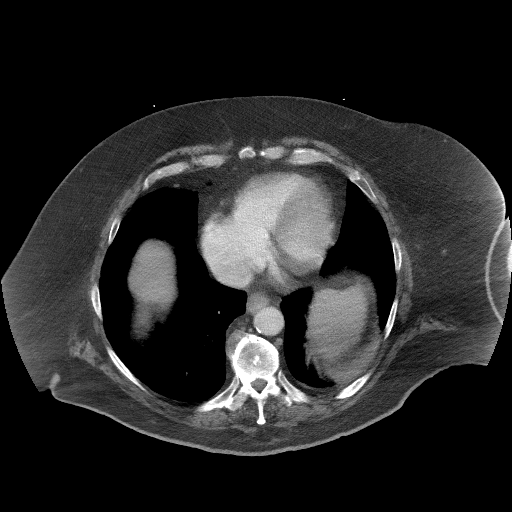

[Series 5: coronal st · coronal · 0.97mm/px · 3 of 142 slices shown]
[im 48/142  soft-tissue]
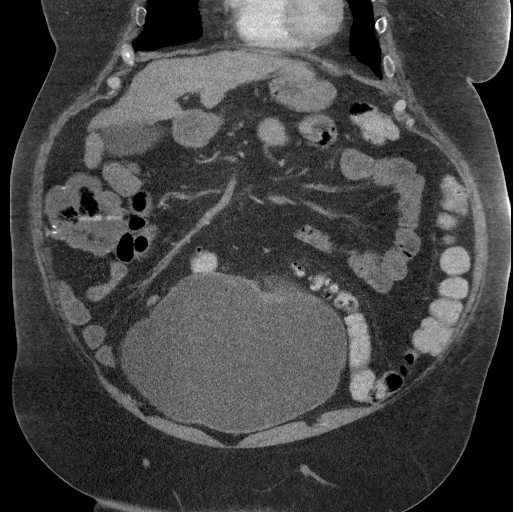
[im 63/142  soft-tissue]
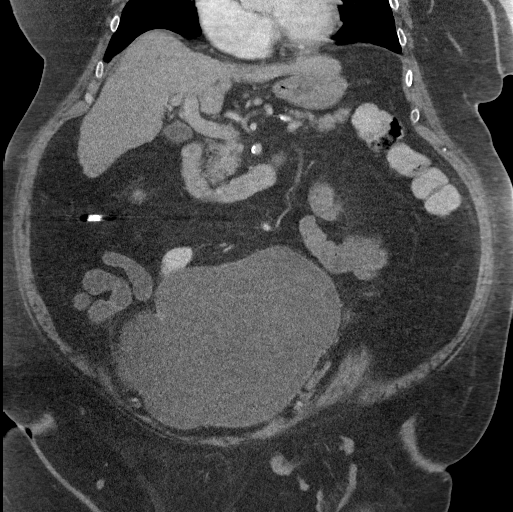
[im 79/142  soft-tissue]
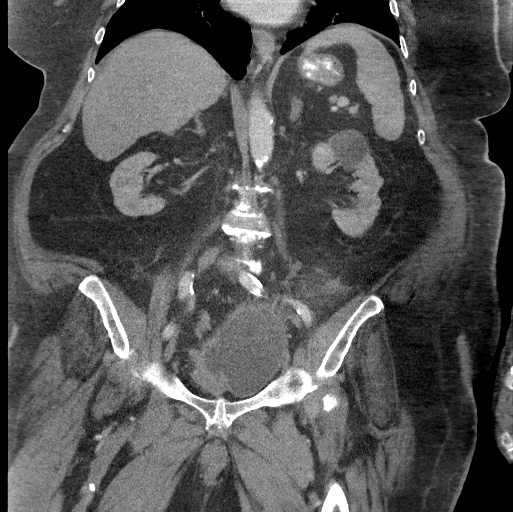

[16 of 46 positions shown; findings below may reference images not displayed]

FINDINGS: Lower chest: No acute abnormality.

Hepatobiliary: The liver is mildly nodular consistent with
underlying cirrhotic change. No focal mass lesion is noted. No
biliary ductal dilatation is seen. The gallbladder demonstrates a
few dependent gallstones.

Pancreas: Unremarkable. No pancreatic ductal dilatation or
surrounding inflammatory changes.

Spleen: Normal in size without focal abnormality.

Adrenals/Urinary Tract: Adrenal glands are within normal limits.
Cystic changes are noted within the kidneys bilaterally. No renal
calculi or obstructive changes are noted. The bladder is
significantly distended

Stomach/Bowel: Scattered diverticular change of the colon is noted
without evidence of diverticulitis. The previously seen stool burden
has resolved in the interval from the previous exam. The appendix
appears of been surgically removed. Postsurgical changes in the
ascending colon are noted. The anastomosis is widely patent. Small
bowel and stomach are within normal limits.

Vascular/Lymphatic: Aortic atherosclerosis. No enlarged abdominal or
pelvic lymph nodes.

Reproductive: Prostate is unremarkable.

Other: Small fat containing herniations are noted just above the
umbilicus in the midline. Small amounts of fluid are noted within.
No bowel is noted within.

Musculoskeletal: Degenerative changes of lumbar spine are noted. No
acute bony abnormality is seen.
IMPRESSION: Small fat containing herniations in the anterior abdominal wall with
mild inflammatory change. No incarcerated bowel is noted.

Changes of cirrhosis of the liver.

Cholelithiasis without complicating factors.

Significant distention of the bladder without focal mass.

## 2018-08-24 MED ORDER — IOHEXOL 300 MG/ML  SOLN
100.0000 mL | Freq: Once | INTRAMUSCULAR | Status: AC | PRN
  Administered 2018-08-24: 100 mL via INTRAVENOUS

## 2018-08-24 MED ORDER — MAGNESIUM CITRATE PO SOLN
1.0000 | Freq: Once | ORAL | Status: AC
  Administered 2018-08-24: 1 via ORAL
  Filled 2018-08-24: qty 296

## 2018-08-24 MED ORDER — IOHEXOL 300 MG/ML  SOLN: 15.0000 mL | Freq: Once | INTRAMUSCULAR | Status: DC | PRN

## 2018-08-24 MED ORDER — MAGNESIUM SULFATE 2 GM/50ML IV SOLN
2.0000 g | Freq: Once | INTRAVENOUS | Status: AC
  Administered 2018-08-24: 2 g via INTRAVENOUS
  Filled 2018-08-24: qty 50

## 2018-08-24 MED ORDER — DIPHENHYDRAMINE HCL 25 MG PO CAPS
25.0000 mg | ORAL_CAPSULE | Freq: Three times a day (TID) | ORAL | Status: DC | PRN
  Administered 2018-08-25: 25 mg via ORAL
  Filled 2018-08-24: qty 1

## 2018-08-24 MED ORDER — SORBITOL 70 % SOLN: 960.0000 mL | TOPICAL_OIL | Freq: Once | ORAL | Status: DC

## 2018-08-24 MED ORDER — SODIUM CHLORIDE (PF) 0.9 % IJ SOLN
INTRAMUSCULAR | Status: AC
  Filled 2018-08-24: qty 50

## 2018-08-24 MED ORDER — POTASSIUM CHLORIDE CRYS ER 20 MEQ PO TBCR
40.0000 meq | EXTENDED_RELEASE_TABLET | Freq: Once | ORAL | Status: AC
  Administered 2018-08-24: 40 meq via ORAL
  Filled 2018-08-24: qty 2

## 2018-08-24 MED ORDER — MAGNESIUM OXIDE 400 (241.3 MG) MG PO TABS
400.0000 mg | ORAL_TABLET | Freq: Two times a day (BID) | ORAL | Status: DC
  Administered 2018-08-24 – 2018-08-25 (×3): 400 mg via ORAL
  Filled 2018-08-24 (×3): qty 1

## 2018-08-24 MED ORDER — SODIUM CHLORIDE 0.9 % IV SOLN: INTRAVENOUS | Status: AC

## 2018-08-24 MED ORDER — NYSTATIN 100000 UNIT/GM EX POWD
Freq: Three times a day (TID) | CUTANEOUS | Status: DC
  Administered 2018-08-24 – 2018-08-25 (×2): via TOPICAL
  Filled 2018-08-24: qty 15

## 2018-08-24 NOTE — Evaluation (Signed)
Physical Therapy Evaluation Patient Details Name: Brandon [name] MRN: [id number] DOB: 1945/10/14 Today's Date: 08/24/2018   History of Present Illness  Brandon [name] is a 73 y.o. male with medical history significant of colon cancer and coronary artery disease, history of CHF, DM 2, HTN presented to hospital with constipation and fatigue subjective fever and chills with diminished appetite.  He was found to have hyponatremia.  Clinical Impression  Pt admitted with above diagnosis. Pt currently with functional limitations due to the deficits listed below (see PT Problem List).  Pt will benefit from skilled PT to increase their independence and safety with mobility to allow discharge to the venue listed below.  Pt with some instability in gait and relying heavily on bed rail for mobility.  He feels he will be okay at home due to his set-up and being in his own environment.  He lives with is 35 year old mother, but has a son nearby that can help if needed.  If his independence in acute care doesn't improve, he may need to consider SNF.  If he refuses, then recommend HHPT.     Follow Up Recommendations Home health PT;SNF    Equipment Recommendations  None recommended by PT    Recommendations for Other Services       Precautions / Restrictions Precautions Precautions: Fall Restrictions Weight Bearing Restrictions: No      Mobility  Bed Mobility Overal bed mobility: Needs Assistance Bed Mobility: Supine to Sit     Supine to sit: Supervision     General bed mobility comments: heavy use of rail and momentum to achieve sitting  Transfers Overall transfer level: Needs assistance Equipment used: Rolling walker (2 wheeled) Transfers: Sit to/from Stand Sit to Stand: +2 safety/equipment;Min guard         General transfer comment: Poor use of hands and moves quickly  Ambulation/Gait Ambulation/Gait assistance: Min guard;+2 safety/equipment;Min assist Gait Distance (Feet): 60  Feet Assistive device: Rolling walker (2 wheeled) Gait Pattern/deviations: Decreased step length - right;Decreased step length - left;Wide base of support Gait velocity: decreased   General Gait Details: Pt took 2 standing rest breaks.  He had to do a quick stop when exiting the room and did have a decreased balance reaction posteriorly and needed MIN A.  Stairs            Wheelchair Mobility    Modified Rankin (Stroke Patients Only)       Balance Overall balance assessment: Needs assistance           Standing balance-Leahy Scale: Poor                               Pertinent Vitals/Pain Pain Assessment: Faces Faces Pain Scale: Hurts little more Pain Location: chronic back pain Pain Descriptors / Indicators: Aching Pain Intervention(s): Monitored during session;Repositioned;Limited activity within patient's tolerance    Home Living Family/patient expects to be discharged to:: Private residence Living Arrangements: Parent Available Help at Discharge: Family Type of Home: Apartment(handicapped) Home Access: Level entry     Home Layout: One level Home Equipment: Grab bars - tub/shower;Toilet riser;Grab bars - toilet Additional Comments: has been sponge bathing    Prior Function Level of Independence: Independent with assistive device(s);Needs assistance      ADL's / Homemaking Assistance Needed: Has been sponge bathing  Comments: In community uses cane or RW.  Furniture/wall cruises in apartment     [place]  Extremity/Trunk Assessment   Upper Extremity Assessment Upper Extremity Assessment: Defer to OT evaluation    Lower Extremity Assessment Lower Extremity Assessment: Generalized weakness;LLE deficits/detail;RLE deficits/detail RLE Sensation: history of peripheral neuropathy LLE Sensation: history of peripheral neuropathy       Communication   Communication: No difficulties  Cognition Arousal/Alertness:  Awake/alert Behavior During Therapy: WFL for tasks assessed/performed Overall Cognitive Status: Within Functional Limits for tasks assessed                                        General Comments      Exercises     Assessment/Plan    PT Assessment Patient needs continued PT services  PT Problem List Decreased strength;Decreased activity tolerance;Decreased balance;Decreased mobility;Decreased knowledge of use of DME       PT Treatment Interventions Gait training;DME instruction;Functional mobility training;Therapeutic activities;Therapeutic exercise;Balance training;Neuromuscular re-education    PT Goals (Current goals can be found in the Care Plan section)  Acute Rehab PT Goals Patient Stated Goal: go home PT Goal Formulation: With patient Time For Goal Achievement: 09/07/18 Potential to Achieve Goals: Good    Frequency Min 3X/week   Barriers to discharge Decreased caregiver support lives with 24 year old mother    Co-evaluation PT/OT/SLP Co-Evaluation/Treatment: Yes Reason for Co-Treatment: For patient/therapist safety PT goals addressed during session: Mobility/safety with mobility         AM-PAC PT "6 Clicks" Mobility  Outcome Measure Help needed turning from your back to your side while in a flat bed without using bedrails?: A Lot Help needed moving from lying on your back to sitting on the side of a flat bed without using bedrails?: Total Help needed moving to and from a bed to a chair (including a wheelchair)?: A Little Help needed standing up from a chair using your arms (e.g., wheelchair or bedside chair)?: A Little Help needed to walk in hospital room?: A Little Help needed climbing 3-5 steps with a railing? : A Lot 6 Click Score: 14    End of Session Equipment Utilized During Treatment: Gait belt Activity Tolerance: Patient tolerated treatment well;Patient limited by fatigue Patient left: with call bell/phone within reach;Other  (comment)(on BSC) Nurse Communication: Mobility status PT Visit Diagnosis: Unsteadiness on feet (R26.81);Muscle weakness (generalized) (M62.81);Difficulty in walking, not elsewhere classified (R26.2)    Time: 3532-9924 PT Time Calculation (min) (ACUTE ONLY): 23 min   Charges:   PT Evaluation $PT Eval Moderate Complexity: 1 Mod          Brandon [name] L. Tamala Julian, [place] Pager [phone] 08/24/2018   Galen Manila 08/24/2018, 12:37 PM

## 2018-08-24 NOTE — Progress Notes (Addendum)
PROGRESS NOTE  Aran Menning WNI:[id number] DOB: October 24, 1945 DOA: 08/23/2018 PCP: Helane Rima, MD   LOS: 0 days   Brief narrative: Brandon [name] is a 73 y.o. male with medical history significant of colon cancer and coronary artery disease, history of CHF, DM 2, HTN presented to hospital with constipation and fatigue subjective fever and chills with diminished appetite.  Assessment/Plan:  Principal Problem:   Hyponatremia Active Problems:   Obstipation   Colon cancer (HCC)   CAD (coronary artery disease)   DM (diabetes mellitus), type 2 (HCC)   Pyuria  Hyponatremia and hypokalemia.  Continue to replenish p.o. and IV.  Continue gentle IV fluid hydration.  Constipation mild abdominal discomfort but no nausea vomiting or overt abdominal pain.  Patient had a small bowel movement yesterday but this still feels constipated.  Check CT scan of the abdomen pelvis due to prior history of colon cancer status post resection and chemotherapy in 2003.  Add magnesium citrate orally.  Diabetes mellitus type 2.  Diet controlled at baseline..  We will closely monitor.  Will hold oral hypoglycemic now.  Continue on diabetic diet.  Continue sliding scale insulin  Coronary artery disease status post CABG.  No acute issues at this time.  Pyuria with dysuria on presentation.  IV antibiotic with Rocephin.  Follow urine cultures.   VTE Prophylaxis: SCDs  Code Status: DNR  Family Communication: No one present at bedside  Disposition Plan: Home in 1 to 2 days.  Will closely monitor electrolytes including hyponatremia.  Patient will need ongoing monitoring including IV fluids so we will change the patient's status to inpatient at this time.  .  Check CT scan of the abdomen and pelvis.   Consultants:  None  Procedures:  None  Antibiotics: Anti-infectives (From admission, onward)   Start     Dose/Rate Route Frequency Ordered Stop   08/23/18 2200  cefTRIAXone (ROCEPHIN) 1 g in sodium chloride  0.9 % 100 mL IVPB     1 g 200 mL/hr over 30 Minutes Intravenous Every 24 hours 08/23/18 2157        Subjective: Patient had a small bowel movement yesterday but still feels bloated and has not had a good bowel movement.  Passing gas.  He has had good urination.  Denies any nausea vomiting or shortness of breath.  Objective: Vitals:   08/23/18 1939 08/24/18 0409  BP: 138/77 (!) 144/87  Pulse: 65 82  Resp: 16 20  Temp: 98.1 F (36.7 C) 97.7 F (36.5 C)  SpO2: 94% 97%    Intake/Output Summary (Last 24 hours) at 08/24/2018 0841 Last data filed at 08/24/2018 0400 Gross per 24 hour  Intake 932.02 ml  Output -  Net 932.02 ml   Filed Weights   08/23/18 1130  Weight: (!) 145.2 kg   Body mass index is 50.12 kg/m.   Physical Exam: GENERAL: Patient is alert awake and oriented. Not in obvious distress.  Morbidly obese HENT: No scleral pallor or icterus. Pupils equally reactive to light. Oral mucosa is moist NECK: is supple, no palpable thyroid enlargement. CHEST: Clear to auscultation. No crackles or wheezes. Non tender on palpation. Diminished breath sounds bilaterally. CVS: S1 and S2 heard, no murmur. Regular rate and rhythm. No pericardial rub. ABDOMEN: Soft, non-tender, bowel sounds are present.  Abdominal hernia noted.  Superficial ulceration over the abdominal wall. No palpable hepato-splenomegaly. EXTREMITIES: No edema. CNS: Cranial nerves are intact. No focal motor or sensory deficits. SKIN: warm and dry, superficial ulceration over the  abdominal wall  Data Review: I have personally reviewed the following laboratory data and studies,  CBC: Recent Labs  Lab 08/23/18 1224 08/24/18 0149  WBC 7.7 8.6  NEUTROABS 4.9  --   HGB 11.7* 11.3*  HCT 37.2* 36.1*  MCV 86.9 88.9  PLT 196 500   Basic Metabolic Panel: Recent Labs  Lab 08/23/18 1358 08/23/18 1936 08/24/18 0149  NA 122* 126* 127*  128*  K 3.7 3.5 3.3*  3.4*  CL 85* 86* 88*  88*  CO2 26 28 27  29     GLUCOSE 129* 115* 117*  119*  BUN 23 20 18  18   CREATININE 1.02 0.95 0.97  0.97  CALCIUM 9.6 9.8 9.5  9.6  MG  --  1.6* 1.5*  PHOS  --  3.0 2.6   Liver Function Tests: Recent Labs  Lab 08/23/18 1358 08/24/18 0149  AST 21 20  ALT 13 14  ALKPHOS 47 42  BILITOT 1.0 0.6  PROT 6.9 6.3*  ALBUMIN 3.7 3.3*   No results for input(s): LIPASE, AMYLASE in the last 168 hours. No results for input(s): AMMONIA in the last 168 hours. Cardiac Enzymes: No results for input(s): CKTOTAL, CKMB, CKMBINDEX, TROPONINI in the last 168 hours. BNP (last 3 results) No results for input(s): BNP in the last 8760 hours.  ProBNP (last 3 results) No results for input(s): PROBNP in the last 8760 hours.  CBG: Recent Labs  Lab 08/23/18 1942 08/24/18 0005 08/24/18 0412 08/24/18 0732  GLUCAP 114* 144* 103* 120*   No results found for this or any previous visit (from the past 240 hour(s)).   Studies: Dg Abd Acute 2+v W 1v Chest  Result Date: 08/23/2018 CLINICAL DATA:  Constipation. Urinary retention. Dizziness. EXAM: DG ABDOMEN ACUTE W/ 1V CHEST COMPARISON:  None. FINDINGS: Previous median sternotomy. Cardiomegaly and aortic atherosclerosis. Pulmonary venous hypertension without frank edema. No infiltrate, collapse or effusion. No free air seen under the diaphragm. Large amount of fecal matter within the colon. Small bowel pattern is normal. No evidence of ileus or obstruction. No significant calcifications. Ordinary chronic spinal degenerative changes. Some metallic density material within the stool in the cecal region. IMPRESSION: 1. Large amount of fecal matter within the colon. No evidence of ileus, obstruction or free air. 2. Cardiomegaly and aortic atherosclerosis. Pulmonary venous hypertension without frank edema. Electronically Signed   By: Nelson Chimes M.D.   On: 08/23/2018 13:13    Scheduled Meds: . insulin aspart  0-9 Units Subcutaneous Q4H    Continuous Infusions: . cefTRIAXone  (ROCEPHIN)  IV Stopped (08/23/18 2331)     Flora Lipps, MD  [facility] 08/24/2018

## 2018-08-24 NOTE — Evaluation (Signed)
Occupational Therapy Evaluation Patient Details Name: Brandon [name] MRN: [id number] DOB: [birth year] Today's Date: 08/24/2018    History of Present Illness Brandon [name] is a 73 y.o. male with medical history significant of colon cancer and coronary artery disease, history of CHF, DM 2, HTN presented to hospital with constipation and fatigue subjective fever and chills with diminished appetite.  He was found to have hyponatremia.   Clinical Impression   Pt was admitted for the above. At baseline, he is mostly independent for adls, but his abilities vary.  He tends to not wear socks/shoes and sponge bathes. He and mother live together and help each other. Will follow in acute setting with supervision level goals.    Follow Up Recommendations  Supervision/Assistance - 24 hour;Home health OT;SNF(pt declining snf)    Equipment Recommendations  (likely none) Pt has a standard commode with toilet riser and bar   Recommendations for Other Services       Precautions / Restrictions Precautions Precautions: Fall Restrictions Weight Bearing Restrictions: No      Mobility Bed Mobility Overal bed mobility: Needs Assistance Bed Mobility: Supine to Sit     Supine to sit: Supervision     General bed mobility comments: heavy use of rail and momentum to achieve sitting  Transfers Overall transfer level: Needs assistance Equipment used: Rolling walker (2 wheeled) Transfers: Sit to/from Stand Sit to Stand: +2 safety/equipment;Min guard         General transfer comment: Poor use of hands and moves quickly    Balance Overall balance assessment: Needs assistance           Standing balance-Leahy Scale: Poor                             ADL either performed or assessed with clinical judgement   ADL Overall ADL's : Needs assistance/impaired Eating/Feeding: Set up   Grooming: Set up   Upper Body Bathing: Set up   Lower Body Bathing: Moderate assistance   Upper Body  Dressing : Minimal assistance   Lower Body Dressing: Maximal assistance(min for pants)   Toilet Transfer: Min guard;+2 for safety/equipment;Stand-pivot;Regular Toilet;RW   Toileting- Clothing Manipulation and Hygiene: Moderate assistance         General ADL Comments: pt does not wear socks/shoes at home.  He and mother "help each other"  Pt tends to hold breath during activities. Pt states he feels he can access standard commode. It is a 15-20 foot walk. He sleeps on couch and bed intermittently     Vision         Perception     Praxis      Pertinent Vitals/Pain Pain Assessment: Faces Faces Pain Scale: Hurts little more Pain Location: chronic back pain Pain Descriptors / Indicators: Aching Pain Intervention(s): Limited activity within patient's tolerance;Monitored during session;Repositioned     Hand Dominance     Extremity/Trunk Assessment Upper Extremity Assessment Upper Extremity Assessment: RUE deficits/detail;LUE deficits/detail RUE Deficits / Details: bil UE strength 4/5 except grip strength reduced:  3+/5  Pt reports decreased sensation; he was able to feel light touch   Lower Extremity Assessment Lower Extremity Assessment: Generalized weakness;LLE deficits/detail;RLE deficits/detail RLE Sensation: history of peripheral neuropathy LLE Sensation: history of peripheral neuropathy       Communication Communication Communication: No difficulties   Cognition Arousal/Alertness: Awake/alert Behavior During Therapy: WFL for tasks assessed/performed Overall Cognitive Status: Within Functional Limits for tasks assessed  General Comments       Exercises     Shoulder Instructions      Home Living Family/patient expects to be discharged to:: Private residence Living Arrangements: Parent Available Help at Discharge: Family Type of Home: Apartment(handicapped) Home Access: Level entry     Home Layout: One  level     Bathroom Shower/Tub: Teacher, early years/pre: Standard(with riser)     Home Equipment: Grab bars - tub/shower;Toilet riser;Grab bars - toilet   Additional Comments: has been sponge bathing      Prior Functioning/Environment Level of Independence: Independent with assistive device(s);Needs assistance    ADL's / Homemaking Assistance Needed: Has been sponge bathing   Comments: In community uses cane or RW.  Furniture/wall cruises in apartment        OT Problem List: Decreased strength;Decreased activity tolerance;Impaired balance (sitting and/or standing);Decreased knowledge of use of DME or AE;Pain      OT Treatment/Interventions: Self-care/ADL training;DME and/or AE instruction;Energy conservation;Patient/family education;Balance training;Therapeutic activities    OT Goals(Current goals can be found in the care plan section) Acute Rehab OT Goals Patient Stated Goal: go home OT Goal Formulation: With patient Time For Goal Achievement: 09/07/18 Potential to Achieve Goals: Good ADL Goals Pt Will Perform Grooming: with supervision;standing Pt Will Transfer to Toilet: with supervision;ambulating;raised height toilet Pt Will Perform Toileting - Clothing Manipulation and hygiene: with supervision;sitting/lateral leans;sit to/from stand Additional ADL Goal #1: pt will initiate at least one rest break during adls for energy conservation with out cues  OT Frequency: Min 2X/week   Barriers to D/C:            Co-evaluation PT/OT/SLP Co-Evaluation/Treatment: Yes Reason for Co-Treatment: For patient/therapist safety PT goals addressed during session: Mobility/safety with mobility OT goals addressed during session: ADL's and self-care      AM-PAC OT "6 Clicks" Daily Activity     Outcome Measure Help from another person eating meals?: A Little Help from another person taking care of personal grooming?: A Little Help from another person toileting, which  includes using toliet, bedpan, or urinal?: A Lot Help from another person bathing (including washing, rinsing, drying)?: A Lot Help from another person to put on and taking off regular upper body clothing?: A Little Help from another person to put on and taking off regular lower body clothing?: A Lot 6 Click Score: 15   End of Session Nurse Communication: (on commode; transporter waiting)  Activity Tolerance: Patient tolerated treatment well Patient left: (on 3:1 with call bell next to him)  OT Visit Diagnosis: Unsteadiness on feet (R26.81);Muscle weakness (generalized) (M62.81)                Time: 6644-0347 OT Time Calculation (min): 23 min Charges:  OT General Charges $OT Visit: 1 Visit OT Evaluation $OT Eval Low Complexity: [place], OTR/L Acute Rehabilitation Services [phone] [facility] pager [phone] office 08/24/2018  [place] 08/24/2018, 1:01 PM

## 2018-08-25 DIAGNOSIS — N3000 Acute cystitis without hematuria: Secondary | ICD-10-CM

## 2018-08-25 DIAGNOSIS — E860 Dehydration: Secondary | ICD-10-CM

## 2018-08-25 LAB — MAGNESIUM: Magnesium: 1.8 mg/dL (ref 1.7–2.4)

## 2018-08-25 LAB — BASIC METABOLIC PANEL
Anion gap: 12 (ref 5–15)
BUN: 10 mg/dL (ref 8–23)
CO2: 26 mmol/L (ref 22–32)
Calcium: 9.5 mg/dL (ref 8.9–10.3)
Chloride: 92 mmol/L — ABNORMAL LOW (ref 98–111)
Creatinine, Ser: 0.79 mg/dL (ref 0.61–1.24)
GFR calc Af Amer: >60 mL/min (ref >60)
GFR calc non Af Amer: >60 mL/min (ref >60)
Glucose, Bld: 126 mg/dL — ABNORMAL HIGH (ref 70–99)
Potassium: 3.8 mmol/L (ref 3.5–5.1)
Sodium: 130 mmol/L — ABNORMAL LOW (ref 135–145)

## 2018-08-25 LAB — CBC
HCT: 41.1 % (ref 39.0–52.0)
Hemoglobin: 12.8 g/dL — ABNORMAL LOW (ref 13.0–17.0)
MCH: 27.9 pg (ref 26.0–34.0)
MCHC: 31.1 g/dL (ref 30.0–36.0)
MCV: 89.5 fL (ref 80.0–100.0)
Platelets: 181 10*3/uL (ref 150–400)
RBC: 4.59 MIL/uL (ref 4.22–5.81)
RDW: 14 % (ref 11.5–15.5)
WBC: 8.4 10*3/uL (ref 4.0–10.5)
nRBC: 0 % (ref 0.0–0.2)

## 2018-08-25 LAB — GLUCOSE, CAPILLARY
Glucose-Capillary: 123 mg/dL — ABNORMAL HIGH (ref 70–99)
Glucose-Capillary: 136 mg/dL — ABNORMAL HIGH (ref 70–99)
Glucose-Capillary: 141 mg/dL — ABNORMAL HIGH (ref 70–99)

## 2018-08-25 MED ORDER — FUROSEMIDE 20 MG PO TABS: 20.0000 mg | ORAL_TABLET | Freq: Every day | ORAL | Status: DC

## 2018-08-25 MED ORDER — DOXYCYCLINE HYCLATE 100 MG PO TABS: 100.0000 mg | ORAL_TABLET | Freq: Two times a day (BID) | ORAL | Status: DC

## 2018-08-25 MED ORDER — PROPRANOLOL HCL 40 MG PO TABS: 40.0000 mg | ORAL_TABLET | Freq: Two times a day (BID) | ORAL | Status: DC

## 2018-08-25 MED ORDER — LISINOPRIL 40 MG PO TABS: 40.0000 mg | ORAL_TABLET | Freq: Every day | ORAL | Status: DC

## 2018-08-25 MED ORDER — DOXYCYCLINE HYCLATE 100 MG PO TABS: 100.0000 mg | ORAL_TABLET | Freq: Two times a day (BID) | ORAL | 0 refills | Status: DC

## 2018-08-25 MED ORDER — MAGNESIUM OXIDE 400 (241.3 MG) MG PO TABS: 400.0000 mg | ORAL_TABLET | Freq: Every day | ORAL | 2 refills | Status: DC

## 2018-08-25 NOTE — Progress Notes (Signed)
Physical Therapy Treatment Patient Details Name: Brandon [name] MRN: [id number] DOB: 1946/07/08 Today's Date: 08/25/2018    History of Present Illness Brandon [name] is a 73 y.o. male with medical history significant of colon cancer and coronary artery disease, history of CHF, DM 2, HTN presented to hospital with constipation and fatigue subjective fever and chills with diminished appetite.  He was found to have hyponatremia.    PT Comments    Patient seen for mobility progression. Up in chair upon arrival. Performing transfers and gait with RW at [place] guard/sup level for general safety. Required 1 standing rest break with gait due to fatigue. Making good progress towards goals.    Follow Up Recommendations  Home health PT;SNF     Equipment Recommendations  None recommended by PT    Recommendations for Other Services       Precautions / Restrictions Precautions Precautions: Fall Restrictions Weight Bearing Restrictions: No    Mobility  Bed Mobility               General bed mobility comments: up in recliner  Transfers Overall transfer level: Needs assistance Equipment used: Rolling walker (2 wheeled) Transfers: Sit to/from [employer] Transfers Sit to Stand: Supervision Stand pivot transfers: Min guard       General transfer comment: good use of handrests to push up from; mild dizziness when first standing that quickly resolves  Ambulation/Gait Ambulation/Gait assistance: Min guard Gait Distance (Feet): 80 Feet Assistive device: Rolling walker (2 wheeled) Gait Pattern/deviations: Step-through pattern;Decreased stride length;Wide base of support Gait velocity: decreased   General Gait Details: required 1 standing rest break due to fatigue; no LOB   Stairs             Wheelchair Mobility    Modified Rankin (Stroke Patients Only)       Balance Overall balance assessment: Needs assistance Sitting-balance support: No upper extremity  supported;Feet supported Sitting balance-Leahy Scale: Good     Standing balance support: Bilateral upper extremity supported;During functional activity Standing balance-Leahy Scale: Fair Standing balance comment: ablet o let go of RW and staticly stand                            Cognition Arousal/Alertness: Awake/alert Behavior During Therapy: WFL for tasks assessed/performed Overall Cognitive Status: Within Functional Limits for tasks assessed                                        Exercises      General Comments        Pertinent Vitals/Pain Pain Assessment: No/denies pain    Home Living                      Prior Function            PT Goals (current goals can now be found in the care plan section) Acute Rehab PT Goals Patient Stated Goal: go home PT Goal Formulation: With patient Time For Goal Achievement: 09/07/18 Potential to Achieve Goals: Good Progress towards PT goals: Progressing toward goals    Frequency    Min 3X/week      PT Plan Current plan remains appropriate    Co-evaluation              AM-PAC PT "6 Clicks" Mobility   Outcome Measure  Help needed turning  from your back to your side while in a flat bed without using bedrails?: A Little Help needed moving from lying on your back to sitting on the side of a flat bed without using bedrails?: A Lot Help needed moving to and from a bed to a chair (including a wheelchair)?: A Little Help needed standing up from a chair using your arms (e.g., wheelchair or bedside chair)?: A Little Help needed to walk in hospital room?: A Little Help needed climbing 3-5 steps with a railing? : A Lot 6 Click Score: 16    End of Session Equipment Utilized During Treatment: Gait belt Activity Tolerance: Patient tolerated treatment well Patient left: in chair;with call bell/phone within reach Nurse Communication: Mobility status PT Visit Diagnosis: Unsteadiness on feet  (R26.81);Muscle weakness (generalized) (M62.81);Difficulty in walking, not elsewhere classified (R26.2)     Time: 7741-2878 PT Time Calculation (min) (ACUTE ONLY): 13 min  Charges:  $Gait Training: 8-22 mins                      Lanney Gins, PT, DPT Supplemental Physical Therapist 08/25/18 3:41 PM Pager: [phone] Office: [phone]

## 2018-08-25 NOTE — Progress Notes (Signed)
Occupational Therapy Treatment Patient Details Name: Brandon [name] MRN: [id number] DOB: 03-Feb-1946 Today's Date: 08/25/2018    History of present illness Brandon [name] is a 73 y.o. male with medical history significant of colon cancer and coronary artery disease, history of CHF, DM 2, HTN presented to hospital with constipation and fatigue subjective fever and chills with diminished appetite.  He was found to have hyponatremia.   OT comments  Goals met. Pt verbalizes energy conservation, used commode at supervision level  Follow Up Recommendations  Supervision/Assistance - 24 hour;Home health OT;SNF    Equipment Recommendations  None recommended by OT    Recommendations for Other Services      Precautions / Restrictions Precautions Precautions: Fall Restrictions Weight Bearing Restrictions: No       Mobility Bed Mobility               General bed mobility comments: oob  Transfers   Equipment used: Rolling walker (2 wheeled)   Sit to Stand: Supervision         General transfer comment: tends to pull up on RW    Balance                                           ADL either performed or assessed with clinical judgement   ADL       Grooming: Wash/dry hands;Supervision/safety;Standing                   Toilet Transfer: Supervision/safety;Ambulation;Comfort height toilet;RW   Toileting- Clothing Manipulation and Hygiene: Modified independent;Sitting/lateral lean         General ADL Comments: pt has riser on his commode and grab bar. He has a Secondary school teacher to gather clothes.     Vision       Perception     Praxis      Cognition Arousal/Alertness: Awake/alert Behavior During Therapy: WFL for tasks assessed/performed Overall Cognitive Status: Within Functional Limits for tasks assessed                                          Exercises     Shoulder Instructions       General Comments      Pertinent  Vitals/ Pain       Pain Assessment: Faces Faces Pain Scale: Hurts a little bit Pain Location: chronic back pain Pain Descriptors / Indicators: Aching Pain Intervention(s): Limited activity within patient's tolerance;Monitored during session;Repositioned  Home Living                                          Prior Functioning/Environment              Frequency           Progress Toward Goals  OT Goals(current goals can now be found in the care plan section)  Progress towards OT goals: Goals met/education completed, patient discharged from [facility] OT "6 Clicks" Daily Activity     Outcome Measure   Help from another person eating meals?:  A Little Help from another person taking care of personal grooming?: A Little Help from another person toileting, which includes using toliet, bedpan, or urinal?: A Little Help from another person bathing (including washing, rinsing, drying)?: A Little Help from another person to put on and taking off regular upper body clothing?: A Little Help from another person to put on and taking off regular lower body clothing?: A Lot 6 Click Score: 17    End of Session    OT Visit Diagnosis: Muscle weakness (generalized) (M62.81)   Activity Tolerance Patient tolerated treatment well   Patient Left     Nurse Communication          Time: 7972-8206 OT Time Calculation (min): 12 min  Charges: OT General Charges $OT Visit: 1 Visit OT Treatments $Self Care/Home Management : 8-22 mins  Lesle Chris, OTR/L Acute Rehabilitation Services [phone] [facility] pager [phone] office 08/25/2018   [place] 08/25/2018, 11:36 AM

## 2018-08-25 NOTE — Care Management Note (Signed)
Case Management Note  Patient Details  Name: Hamza Empson MRN: [id number] Date of Birth: 1946/05/17  Subjective/Objective:                  discharged  Action/Plan: [facility] Expected Discharge Date:  08/25/18               Expected Discharge Plan:  [place]  In-House Referral:     Discharge planning Services  CM Consult  Post Acute Care Choice:  Home Health Choice offered to:  Patient  DME Arranged:    DME Agency:     HH Arranged:  PT HH Agency:  [facility]  Status of Service:  Completed, signed off  If discussed at [place] of Stay Meetings, dates discussed:    Additional Comments:  Leeroy Cha, RN 08/25/2018, 4:44 PM

## 2018-08-25 NOTE — Discharge Summary (Addendum)
Physician Discharge Summary  Patient ID: Brandon [name] MRN: [id number] DOB/AGE: 1945/12/19 73 y.o.  Admit date: 08/23/2018 Discharge date: 08/25/2018  Admission Diagnoses:   Dehydration   Hyponatremia   Obstipation   Colon cancer (HCC)   CAD (coronary artery disease)   DM (diabetes mellitus), type 2 ([place])   Pyuria  Discharge Diagnoses:    Dehydration   Staph aureus UTI   Hyponatremia   Obstipation   Colon cancer (HCC)   CAD (coronary artery disease)   DM (diabetes mellitus), type 2 ([place])   Pyuria   Discharged Condition: good  Presentation Summary: Brandon Cluteis a 73 y.o.malewith medical history significant of colon cancer and coronary artery disease,history of CHF, DM 2, HTN presented to hospital with constipation and fatigue subjective fever and chills with diminished appetite.   Hospital Course:  #) Hyponatremia/ dehydration/ volume depletion: resolved w/ IVF"s and Rx of UTI and constipation. Patient feeling much better , back to baseline.   #) Staph Aureus UTI - pyuria on UA.  Asymptomatic.  - received IV Rocephin here 1/27- 1/28 - will dc on po doxycycline for 5d to complete 7d course of abx - sensitivity is not back due to low colony count will be out in 48 hrs, we will f/u and communicate to PCP  #) Constipation - w/ mild discomfort, no nausea/ vomiting. Resolved, large BM's while here.    #) Diabetes mellitus type 2 - stable here - resume metformin at dc.  - Continue on diabetic diet  #) Hypertension: on 4 BP meds and 2 diuretics.  Will stagger reintroducing these, start 1/2 meds on dc and the other 1/2 of meds in a few days on 08/29/18.    #) Coronary artery disease status post CABG.  No acute issues   #)  DNR  # ) Hypomagnesemia: dc on po Mg supplements     Discharge Exam: Blood pressure 121/65, pulse 86, temperature (!) 97.5 F (36.4 C), temperature source Oral, resp. rate 20, height 5\' 7"  (1.702 m), weight (!) 145.2 kg, SpO2 95 %. GENERAL:  Patient is alert awake and oriented. No  distress. Morbid obese HENT: No scleral pallor or icterus. Pupils equally reactive to light NECK: is supple, no palpable thyroid enlargement. CHEST: Clear to auscultation. No crackles or wheezes. Non tender CVS: S1 and S2 heard, no murmur. Regular rate and rhythm. No pericardial rub. ABDOMEN: Soft, non-tender, bowel sounds are present.  Abdominal hernia noted.  Superficial ulceration over the abdominal wall.  No HSM EXTREMITIES: No edema. CNS: Cranial nerves are intact. No focal motor or sensory deficits. SKIN: warm and dry, superficial ulceration over the abdominal wall  Disposition: Discharge disposition: 01-Home or Self Care       Allergies as of 08/25/2018   No Known Allergies     Medication List    TAKE these medications   allopurinol 300 MG tablet Commonly known as:  ZYLOPRIM Take 300 mg by mouth daily.   amLODipine 10 MG tablet Commonly known as:  NORVASC Take 10 mg by mouth daily.   aspirin 81 MG tablet Take 81 mg by mouth daily.   cloNIDine 0.2 MG tablet Commonly known as:  CATAPRES Take 0.2 mg by mouth 2 (two) times daily.   doxycycline 100 MG tablet Commonly known as:  VIBRA-TABS Take 1 tablet (100 mg total) by mouth every 12 (twelve) hours.   ferrous sulfate 325 (65 FE) MG tablet Take 325 mg by mouth daily.   finasteride 5 MG tablet Commonly known  as:  PROSCAR Take 5 mg by mouth daily.   furosemide 20 MG tablet Commonly known as:  LASIX Take 1 tablet (20 mg total) by mouth daily. Start taking on:  August 29, 2018 What changed:  These instructions start on August 29, 2018. If you are unsure what to do until then, ask your doctor or other care provider.   hydrochlorothiazide 25 MG tablet Commonly known as:  HYDRODIURIL Take 25 mg by mouth daily.   lisinopril 40 MG tablet Commonly known as:  PRINIVIL,ZESTRIL Take 1 tablet (40 mg total) by mouth daily. Start taking on:  August 29, 2018 What changed:   These instructions start on August 29, 2018. If you are unsure what to do until then, ask your doctor or other care provider.   magnesium oxide 400 (241.3 Mg) MG tablet Commonly known as:  MAG-OX Take 1 tablet (400 mg total) by mouth daily.   metFORMIN 500 MG 24 hr tablet Commonly known as:  GLUCOPHAGE-XR Take 500 mg by mouth every morning.   Misc. Devices Misc Apply 1 application topically 2 (two) times daily. Triamcinolone 0.1% cream with Aquaphor mixed 3:1, Apply BID prn, Disp 1 lb   multivitamin tablet Take 1 tablet by mouth daily.   omeprazole 10 MG capsule Commonly known as:  PRILOSEC Take 10 mg by mouth daily.   pravastatin 10 MG tablet Commonly known as:  PRAVACHOL Take 10 mg by mouth at bedtime.   propranolol 40 MG tablet Commonly known as:  INDERAL Take 1 tablet (40 mg total) by mouth 2 (two) times daily. Start taking on:  August 29, 2018 What changed:  These instructions start on August 29, 2018. If you are unsure what to do until then, ask your doctor or other care provider.   tamsulosin 0.4 MG Caps capsule Commonly known as:  FLOMAX Take 0.4 mg by mouth at bedtime.        Signed: Sandy Salaam [place] 08/25/2018, 3:42 PM

## 2018-08-27 LAB — URINE CULTURE: Culture: >=100000 — AB

## 2018-09-29 ENCOUNTER — Encounter (HOSPITAL_COMMUNITY): Payer: Self-pay

## 2018-09-29 ENCOUNTER — Encounter (HOSPITAL_COMMUNITY): Payer: Self-pay | Admitting: *Deleted

## 2018-09-29 ENCOUNTER — Inpatient Hospital Stay (HOSPITAL_COMMUNITY)
Admission: EM | Admit: 2018-09-29 | Discharge: 2018-10-08 | DRG: 501 | Disposition: A | Discharge status: Skilled Nursing Facility | Payer: Medicare Other | Attending: Family Medicine | Admitting: Family Medicine

## 2018-09-29 ENCOUNTER — Emergency Department (HOSPITAL_COMMUNITY)
Admission: EM | Admit: 2018-09-29 | Discharge: 2018-09-29 | Disposition: A | Discharge status: Home / Self Care | Payer: Medicare Other | Source: Home / Self Care | Attending: Emergency Medicine | Admitting: Emergency Medicine

## 2018-09-29 ENCOUNTER — Emergency Department (HOSPITAL_COMMUNITY): Payer: Medicare Other

## 2018-09-29 ENCOUNTER — Other Ambulatory Visit: Payer: Self-pay

## 2018-09-29 DIAGNOSIS — I2510 Atherosclerotic heart disease of native coronary artery without angina pectoris: Secondary | ICD-10-CM

## 2018-09-29 DIAGNOSIS — T148XXA Other injury of unspecified body region, initial encounter: Secondary | ICD-10-CM

## 2018-09-29 DIAGNOSIS — R262 Difficulty in walking, not elsewhere classified: Secondary | ICD-10-CM

## 2018-09-29 DIAGNOSIS — Z8619 Personal history of other infectious and parasitic diseases: Secondary | ICD-10-CM | POA: Diagnosis not present

## 2018-09-29 DIAGNOSIS — E669 Obesity, unspecified: Secondary | ICD-10-CM

## 2018-09-29 DIAGNOSIS — Z79899 Other long term (current) drug therapy: Secondary | ICD-10-CM

## 2018-09-29 DIAGNOSIS — S86891A Other injury of other muscle(s) and tendon(s) at lower leg level, right leg, initial encounter: Secondary | ICD-10-CM

## 2018-09-29 DIAGNOSIS — E291 Testicular hypofunction: Secondary | ICD-10-CM | POA: Diagnosis present

## 2018-09-29 DIAGNOSIS — Z7982 Long term (current) use of aspirin: Secondary | ICD-10-CM

## 2018-09-29 DIAGNOSIS — Z85038 Personal history of other malignant neoplasm of large intestine: Secondary | ICD-10-CM

## 2018-09-29 DIAGNOSIS — E119 Type 2 diabetes mellitus without complications: Secondary | ICD-10-CM

## 2018-09-29 DIAGNOSIS — Z87891 Personal history of nicotine dependence: Secondary | ICD-10-CM

## 2018-09-29 DIAGNOSIS — W19XXXA Unspecified fall, initial encounter: Secondary | ICD-10-CM

## 2018-09-29 DIAGNOSIS — W1830XA Fall on same level, unspecified, initial encounter: Secondary | ICD-10-CM

## 2018-09-29 DIAGNOSIS — R531 Weakness: Secondary | ICD-10-CM | POA: Diagnosis present

## 2018-09-29 DIAGNOSIS — I252 Old myocardial infarction: Secondary | ICD-10-CM | POA: Diagnosis not present

## 2018-09-29 DIAGNOSIS — S92414A Nondisplaced fracture of proximal phalanx of right great toe, initial encounter for closed fracture: Secondary | ICD-10-CM

## 2018-09-29 DIAGNOSIS — X501XXA Overexertion from prolonged static or awkward postures, initial encounter: Secondary | ICD-10-CM | POA: Diagnosis not present

## 2018-09-29 DIAGNOSIS — Z66 Do not resuscitate: Secondary | ICD-10-CM | POA: Diagnosis present

## 2018-09-29 DIAGNOSIS — S82009A Unspecified fracture of unspecified patella, initial encounter for closed fracture: Secondary | ICD-10-CM | POA: Diagnosis present

## 2018-09-29 DIAGNOSIS — K219 Gastro-esophageal reflux disease without esophagitis: Secondary | ICD-10-CM | POA: Diagnosis present

## 2018-09-29 DIAGNOSIS — S82091A Other fracture of right patella, initial encounter for closed fracture: Secondary | ICD-10-CM | POA: Diagnosis present

## 2018-09-29 DIAGNOSIS — I10 Essential (primary) hypertension: Secondary | ICD-10-CM | POA: Diagnosis present

## 2018-09-29 DIAGNOSIS — M545 Low back pain: Secondary | ICD-10-CM

## 2018-09-29 DIAGNOSIS — F1010 Alcohol abuse, uncomplicated: Secondary | ICD-10-CM | POA: Diagnosis present

## 2018-09-29 DIAGNOSIS — S80812A Abrasion, left lower leg, initial encounter: Secondary | ICD-10-CM | POA: Diagnosis present

## 2018-09-29 DIAGNOSIS — S82021A Displaced longitudinal fracture of right patella, initial encounter for closed fracture: Principal | ICD-10-CM | POA: Diagnosis present

## 2018-09-29 DIAGNOSIS — Y9301 Activity, walking, marching and hiking: Secondary | ICD-10-CM

## 2018-09-29 DIAGNOSIS — M79671 Pain in right foot: Secondary | ICD-10-CM | POA: Insufficient documentation

## 2018-09-29 DIAGNOSIS — S80811A Abrasion, right lower leg, initial encounter: Secondary | ICD-10-CM | POA: Diagnosis present

## 2018-09-29 DIAGNOSIS — N400 Enlarged prostate without lower urinary tract symptoms: Secondary | ICD-10-CM | POA: Diagnosis present

## 2018-09-29 DIAGNOSIS — Y92481 Parking lot as the place of occurrence of the external cause: Secondary | ICD-10-CM

## 2018-09-29 DIAGNOSIS — Z8249 Family history of ischemic heart disease and other diseases of the circulatory system: Secondary | ICD-10-CM

## 2018-09-29 DIAGNOSIS — Z7984 Long term (current) use of oral hypoglycemic drugs: Secondary | ICD-10-CM | POA: Diagnosis not present

## 2018-09-29 DIAGNOSIS — Y999 Unspecified external cause status: Secondary | ICD-10-CM | POA: Insufficient documentation

## 2018-09-29 DIAGNOSIS — Z800 Family history of malignant neoplasm of digestive organs: Secondary | ICD-10-CM

## 2018-09-29 DIAGNOSIS — Y929 Unspecified place or not applicable: Secondary | ICD-10-CM

## 2018-09-29 DIAGNOSIS — S92911A Unspecified fracture of right toe(s), initial encounter for closed fracture: Secondary | ICD-10-CM | POA: Diagnosis present

## 2018-09-29 DIAGNOSIS — S92514A Nondisplaced fracture of proximal phalanx of right lesser toe(s), initial encounter for closed fracture: Secondary | ICD-10-CM

## 2018-09-29 DIAGNOSIS — M25551 Pain in right hip: Secondary | ICD-10-CM | POA: Insufficient documentation

## 2018-09-29 DIAGNOSIS — Z419 Encounter for procedure for purposes other than remedying health state, unspecified: Secondary | ICD-10-CM

## 2018-09-29 DIAGNOSIS — Z951 Presence of aortocoronary bypass graft: Secondary | ICD-10-CM

## 2018-09-29 DIAGNOSIS — E8889 Other specified metabolic disorders: Secondary | ICD-10-CM | POA: Diagnosis present

## 2018-09-29 DIAGNOSIS — Y9389 Activity, other specified: Secondary | ICD-10-CM | POA: Diagnosis not present

## 2018-09-29 DIAGNOSIS — D62 Acute posthemorrhagic anemia: Secondary | ICD-10-CM | POA: Diagnosis not present

## 2018-09-29 DIAGNOSIS — Z6841 Body Mass Index (BMI) 40.0 and over, adult: Secondary | ICD-10-CM | POA: Diagnosis not present

## 2018-09-29 DIAGNOSIS — E785 Hyperlipidemia, unspecified: Secondary | ICD-10-CM | POA: Diagnosis present

## 2018-09-29 LAB — CBG MONITORING, ED: Glucose-Capillary: 153 mg/dL — ABNORMAL HIGH (ref 70–99)

## 2018-09-29 LAB — CBC WITH DIFFERENTIAL/PLATELET
Abs Immature Granulocytes: 0.03 10*3/uL (ref 0.00–0.07)
Basophils Absolute: 0.1 10*3/uL (ref 0.0–0.1)
Basophils Relative: 1 %
Eosinophils Absolute: 0.5 10*3/uL (ref 0.0–0.5)
Eosinophils Relative: 6 %
HCT: 37.6 % — ABNORMAL LOW (ref 39.0–52.0)
Hemoglobin: 11.6 g/dL — ABNORMAL LOW (ref 13.0–17.0)
Immature Granulocytes: 0 %
Lymphocytes Relative: 9 %
Lymphs Abs: 0.8 10*3/uL (ref 0.7–4.0)
MCH: 28 pg (ref 26.0–34.0)
MCHC: 30.9 g/dL (ref 30.0–36.0)
MCV: 90.8 fL (ref 80.0–100.0)
Monocytes Absolute: 1.1 10*3/uL — ABNORMAL HIGH (ref 0.1–1.0)
Monocytes Relative: 12 %
Neutro Abs: 6.4 10*3/uL (ref 1.7–7.7)
Neutrophils Relative %: 72 %
Platelets: 166 10*3/uL (ref 150–400)
RBC: 4.14 MIL/uL — ABNORMAL LOW (ref 4.22–5.81)
RDW: 18.4 % — ABNORMAL HIGH (ref 11.5–15.5)
WBC: 8.9 10*3/uL (ref 4.0–10.5)
nRBC: 0 % (ref 0.0–0.2)

## 2018-09-29 LAB — BASIC METABOLIC PANEL
Anion gap: 9 (ref 5–15)
BUN: 11 mg/dL (ref 8–23)
CO2: 23 mmol/L (ref 22–32)
Calcium: 9.3 mg/dL (ref 8.9–10.3)
Chloride: 99 mmol/L (ref 98–111)
Creatinine, Ser: 0.86 mg/dL (ref 0.61–1.24)
GFR calc Af Amer: >60 mL/min (ref >60)
GFR calc non Af Amer: >60 mL/min (ref >60)
Glucose, Bld: 118 mg/dL — ABNORMAL HIGH (ref 70–99)
Potassium: 4.3 mmol/L (ref 3.5–5.1)
Sodium: 131 mmol/L — ABNORMAL LOW (ref 135–145)

## 2018-09-29 IMAGING — CR DG ANKLE COMPLETE 3+V*R*
3 series · 3 of 3 positions shown · non-contrast
Comparison: None.

CLINICAL DATA: Mechanical fall with foot pain.

EXAM:
RIGHT ANKLE - COMPLETE 3+ VIEW

[ankle ap]
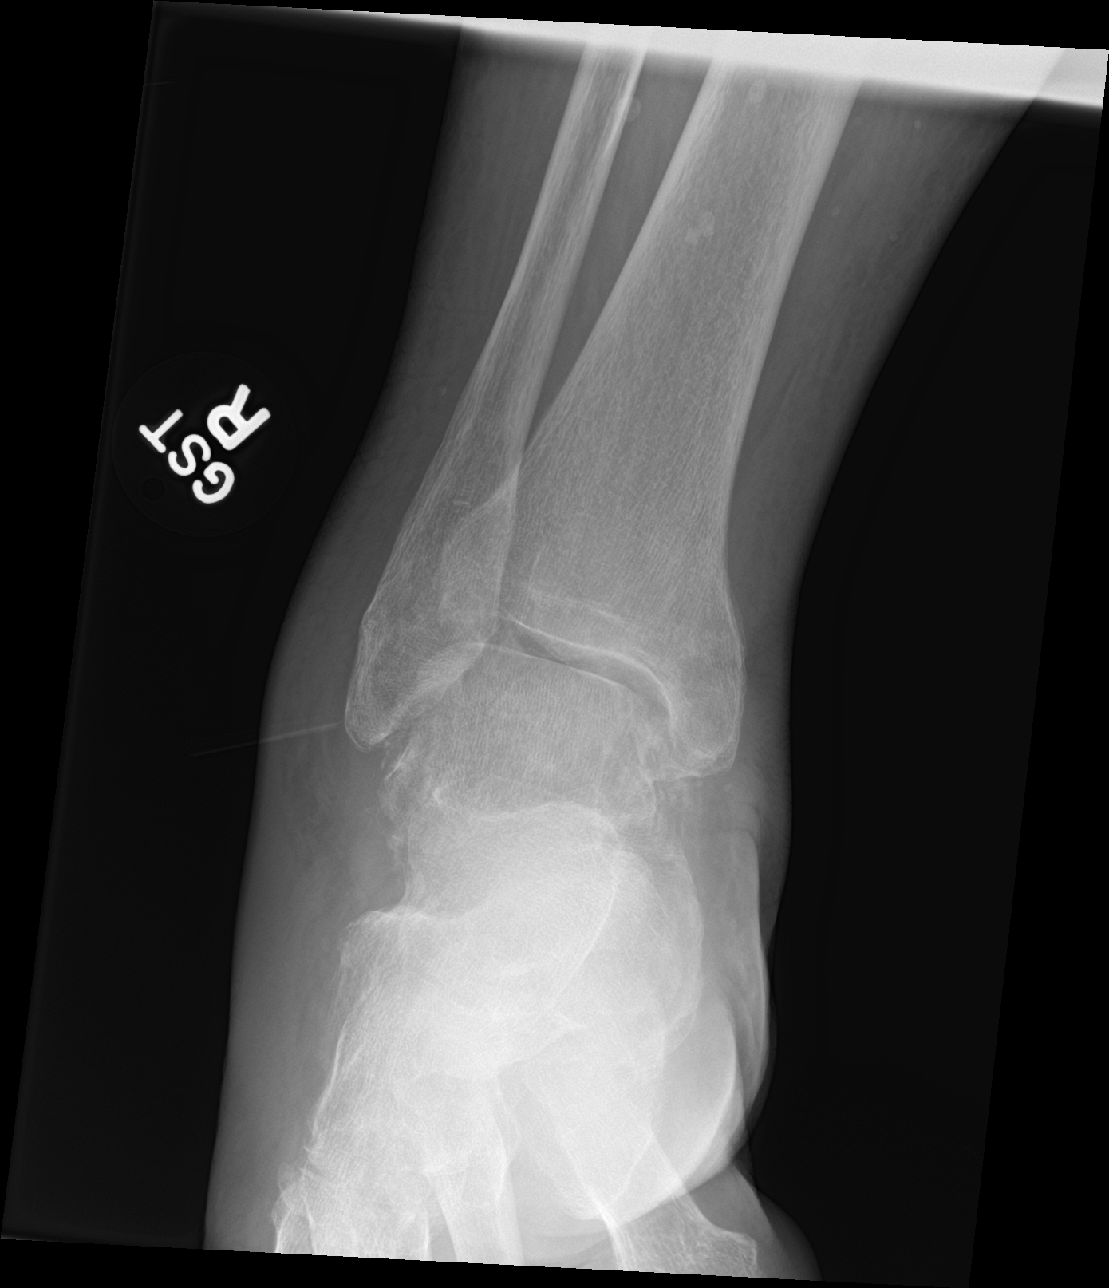

[ankle obl]
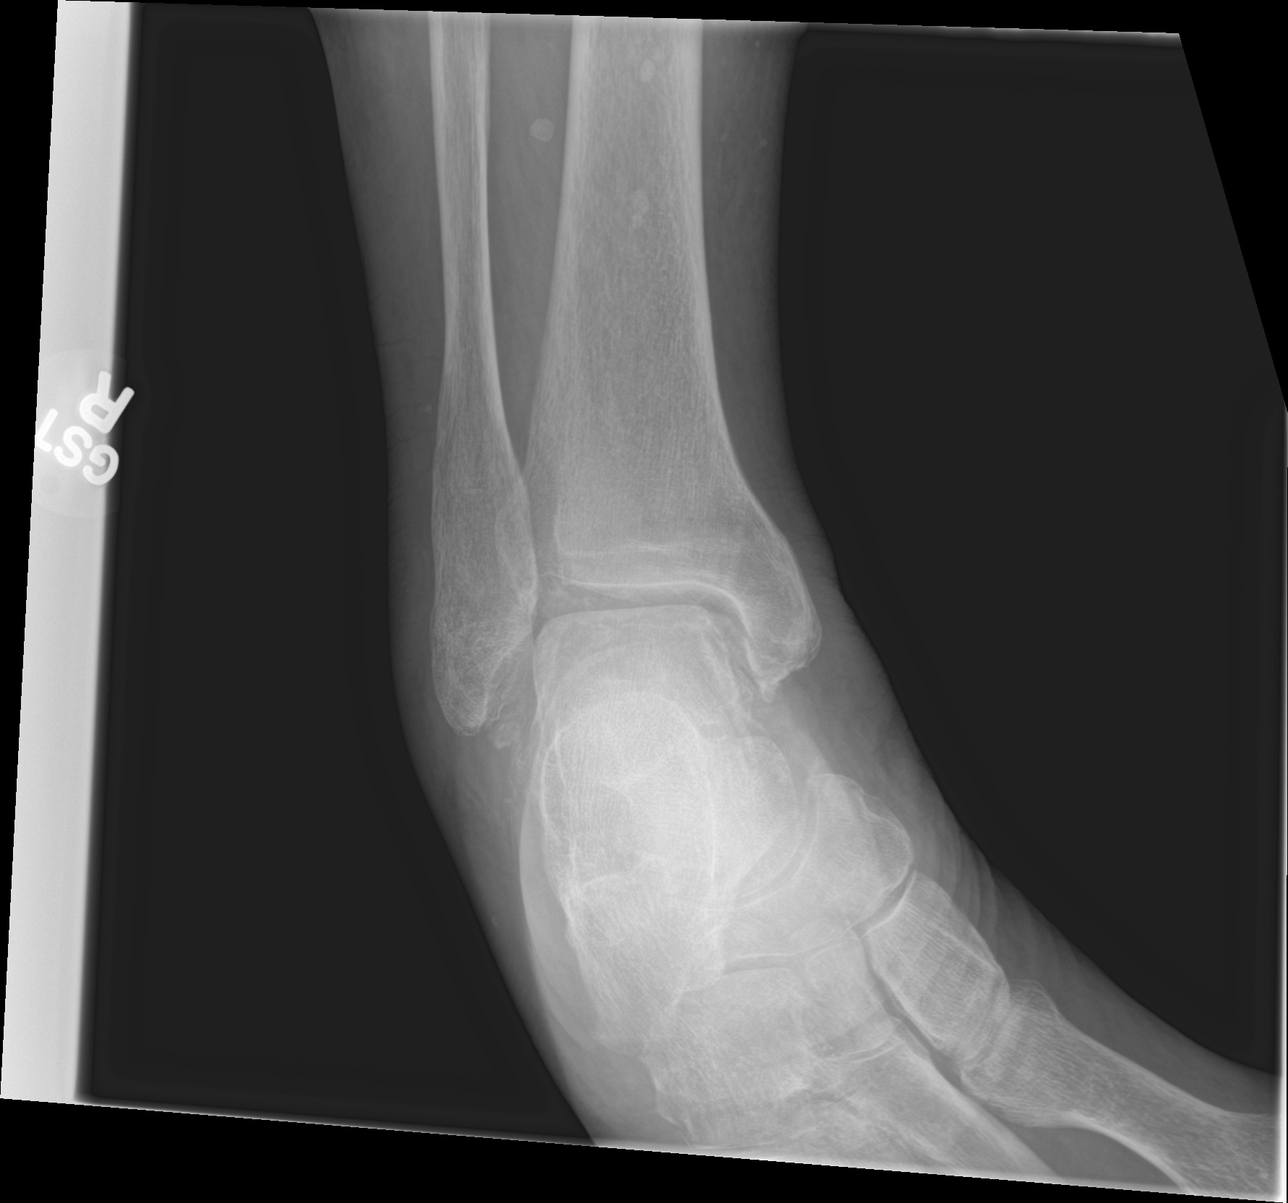

[ankle lat]
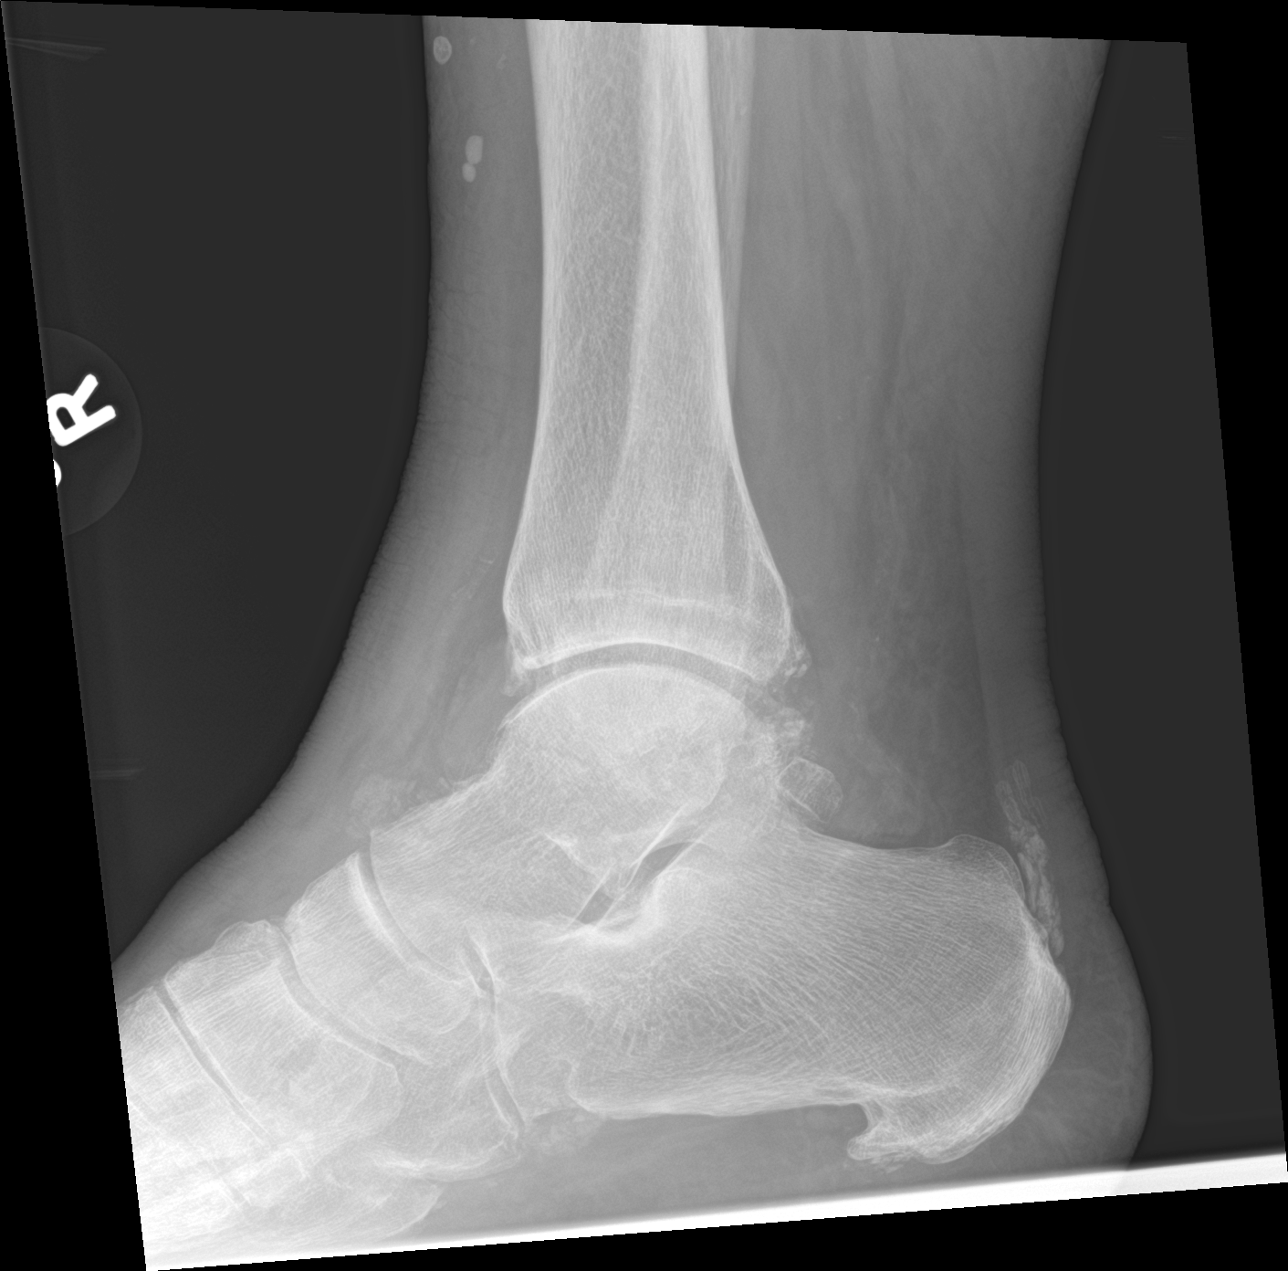

[3 of 3 positions shown; findings below may reference images not displayed]

FINDINGS: Joint effusion and soft tissue swelling without acute fracture or
malalignment. Periarticular calcifications without erosion,
nonspecific. Heel spurs and osteopenia.
IMPRESSION: Soft tissue swelling and joint effusion without acute fracture.

## 2018-09-29 IMAGING — CR DG KNEE 1-2V*R*
2 series · 2 of 2 positions shown · non-contrast
Comparison: None.

CLINICAL DATA: Fall from standing.  Pain and stiffness.

EXAM:
RIGHT KNEE - 1-2 VIEW

[knee ap]
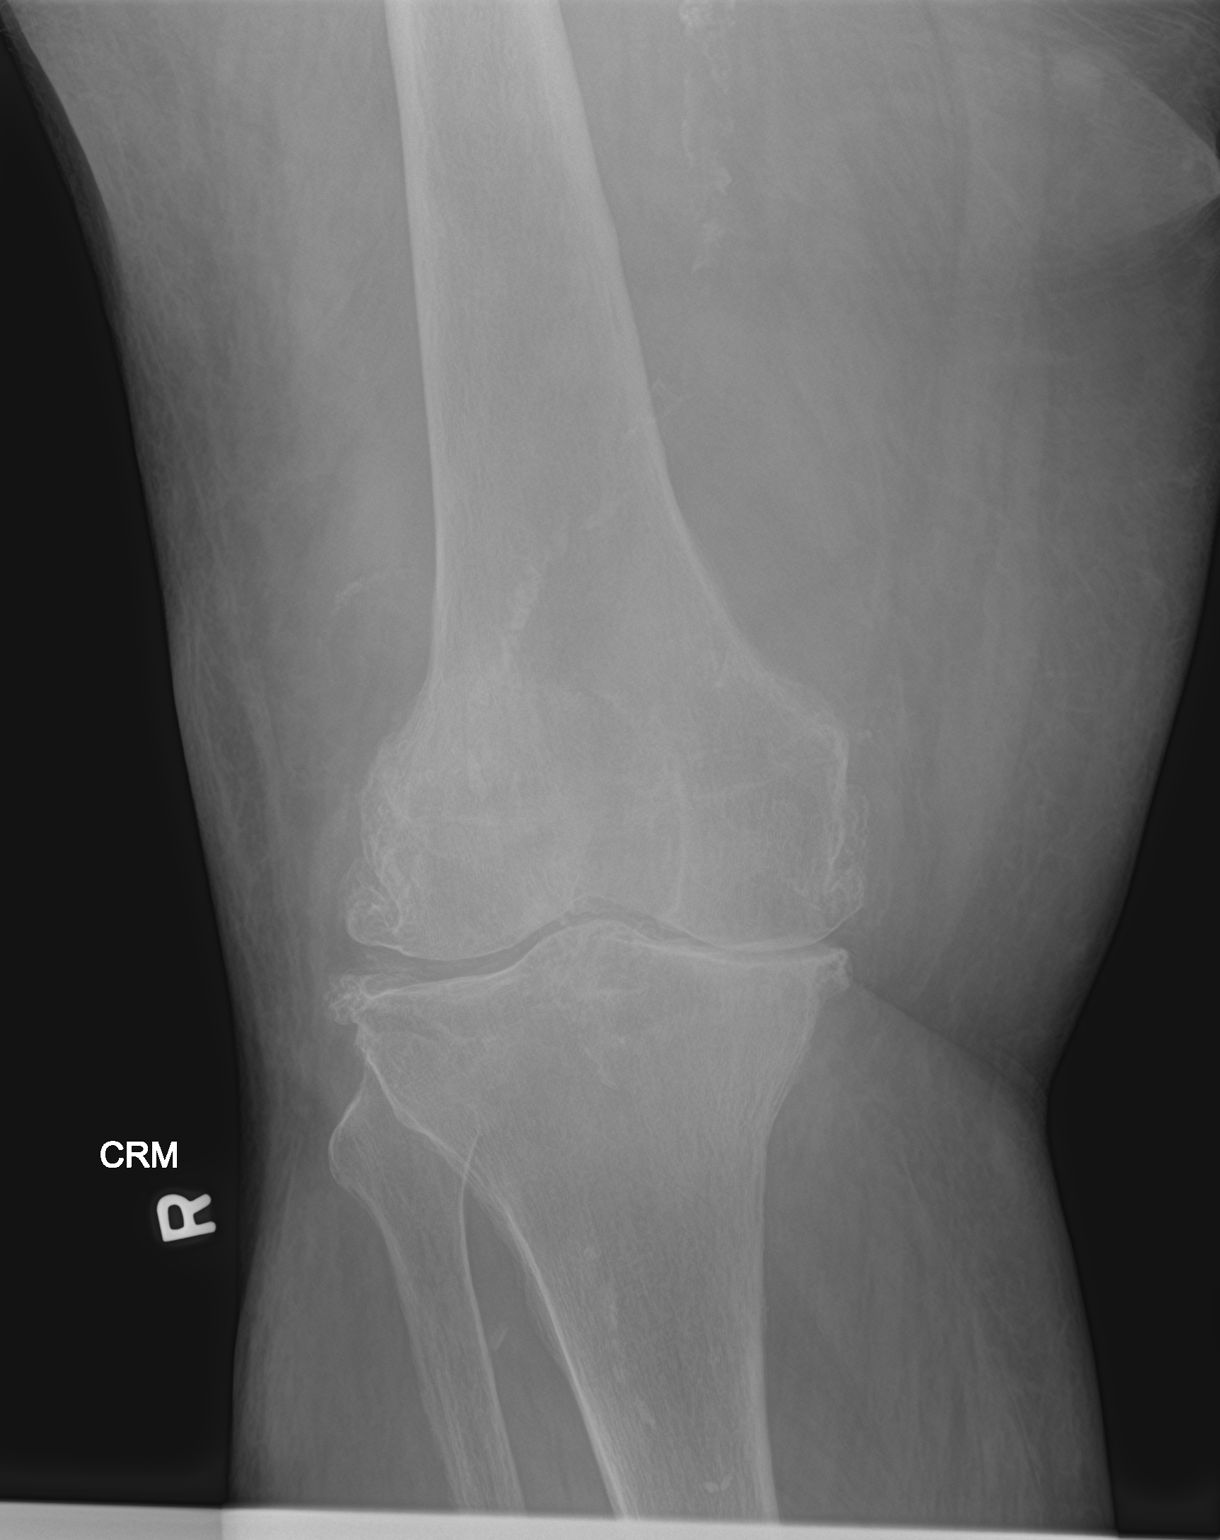

[knee lat]
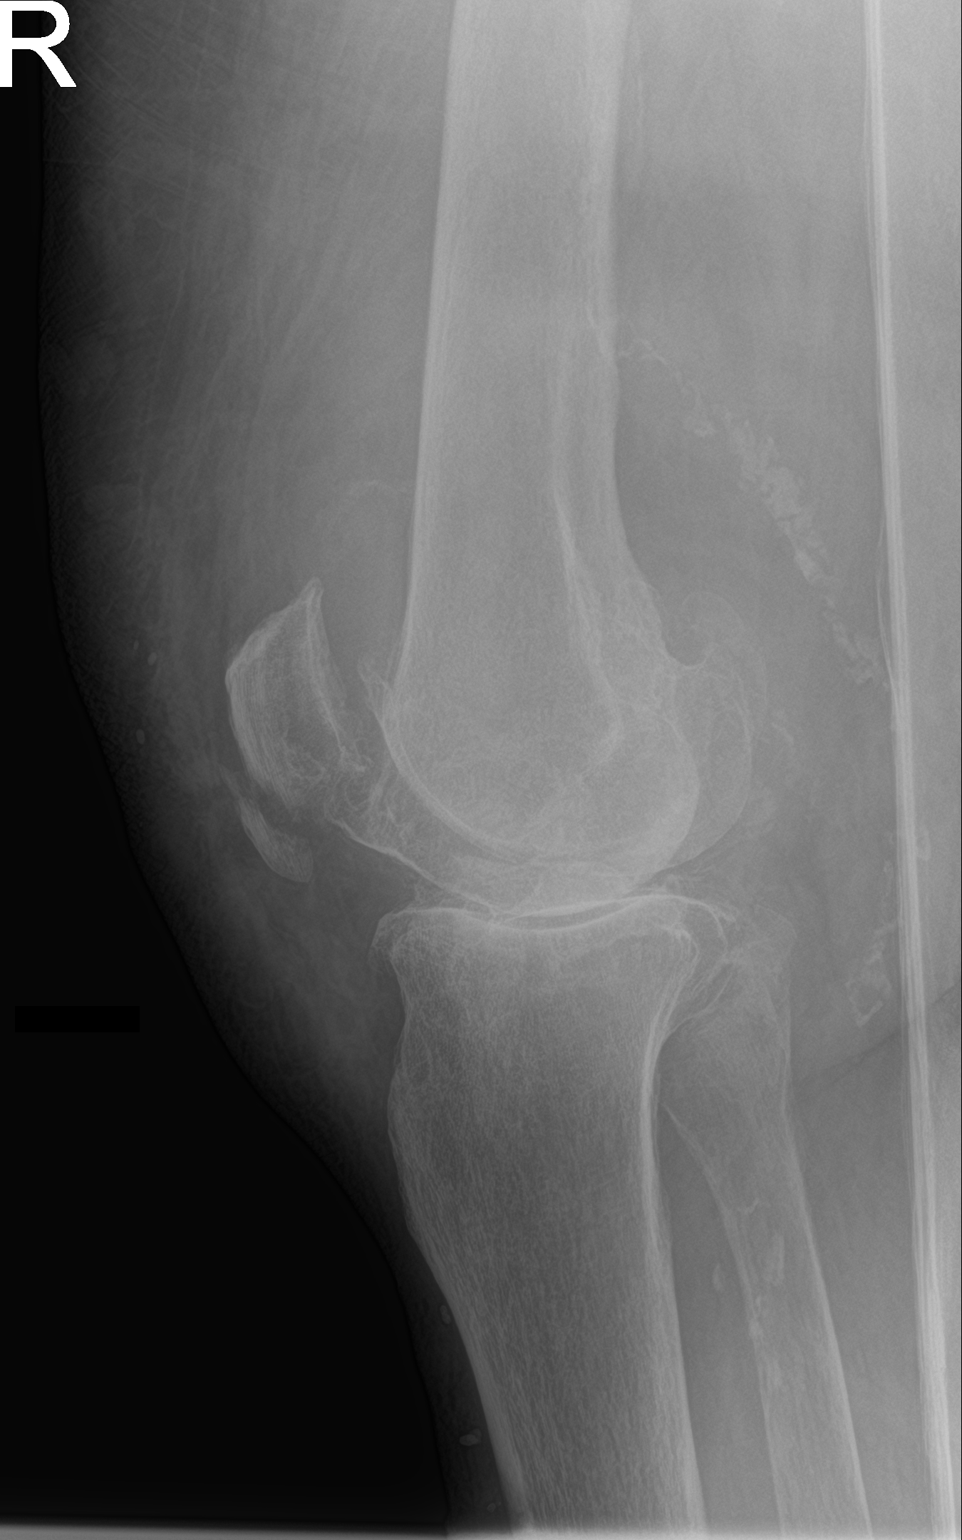

[2 of 2 positions shown; findings below may reference images not displayed]

FINDINGS: Acute avulsion fracture lower pole of patella and alignment. Mild
high-riding patella. Severe medial compartment narrowing,, moderate
within lateral compartment with marginal spurring and periarticular
sclerosis. No destructive bony lesions. Advanced vascular
calcifications. Large suprapatellar joint effusion. Prepatellar soft
tissue swelling.
IMPRESSION: 1. Acute patella avulsion fracture. Large suprapatellar joint
effusion.
2. Severe medial compartment osteoarthrosis.

## 2018-09-29 IMAGING — CR DG HIP (WITH OR WITHOUT PELVIS) 2-3V*R*
3 series · 3 of 3 positions shown · non-contrast
Comparison: None.

CLINICAL DATA: Fall with right hip pain

EXAM:
DG HIP (WITH OR WITHOUT PELVIS) 2-3V RIGHT

[hip ap]
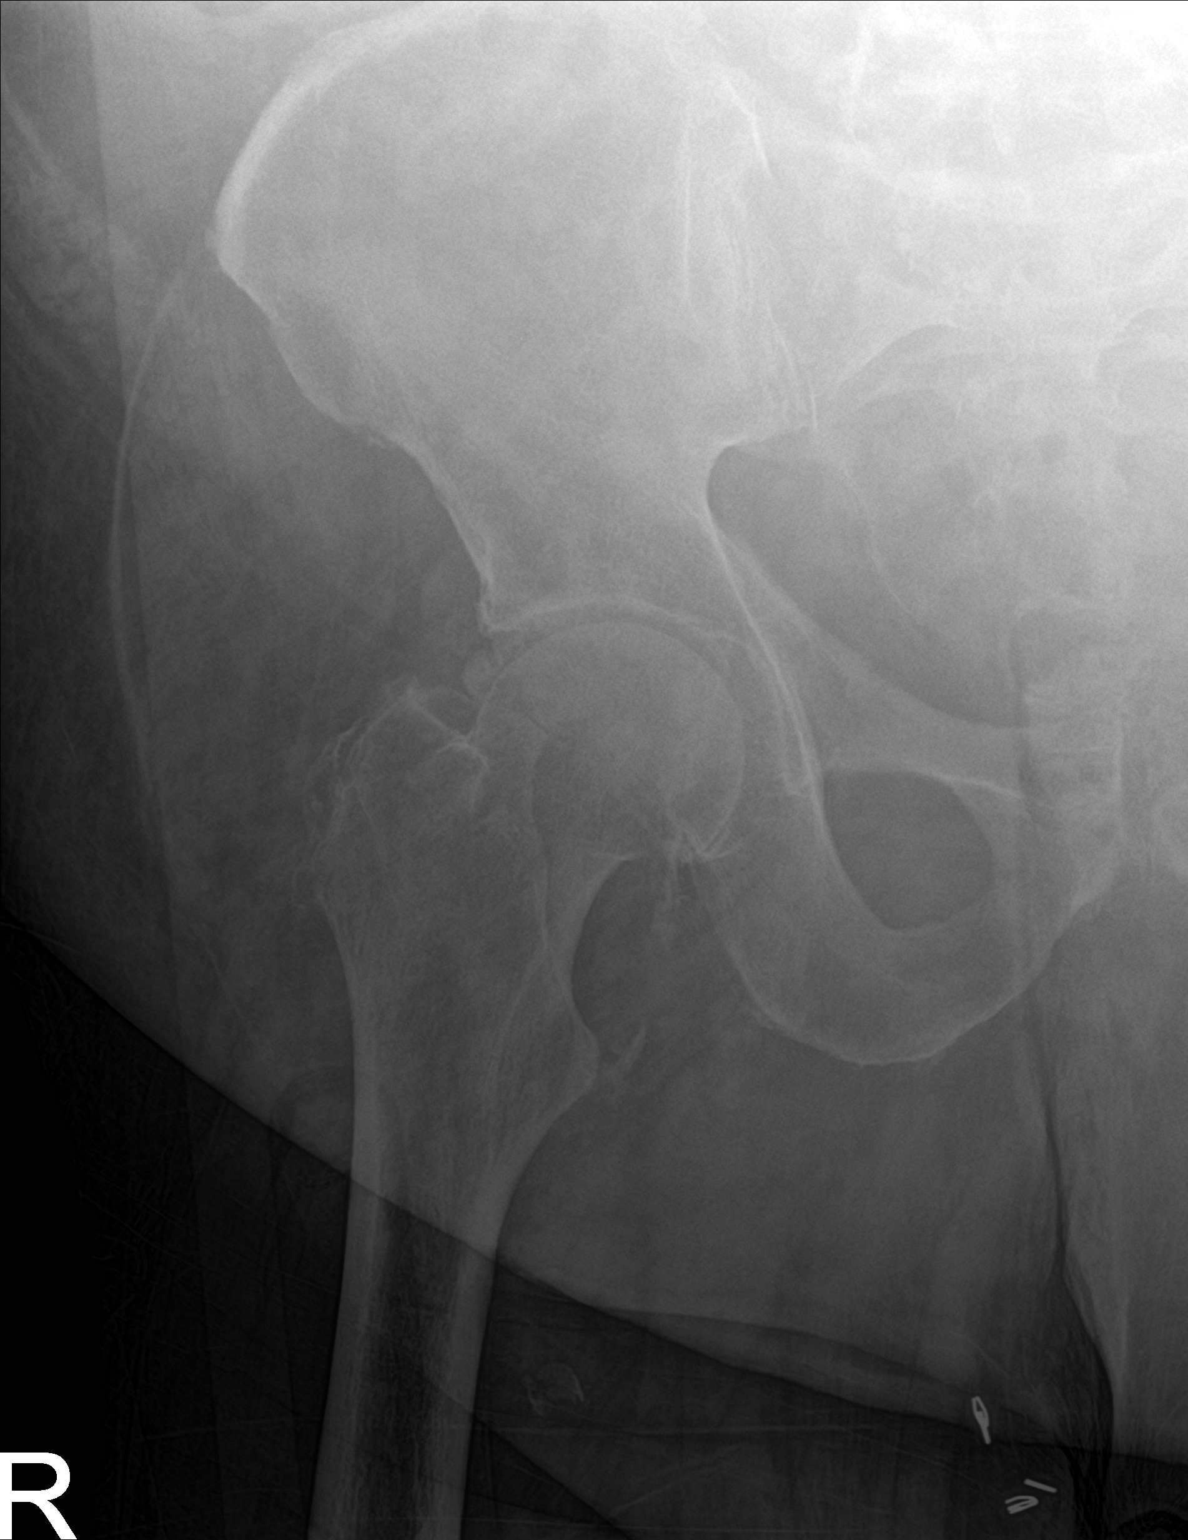

[hip lat]
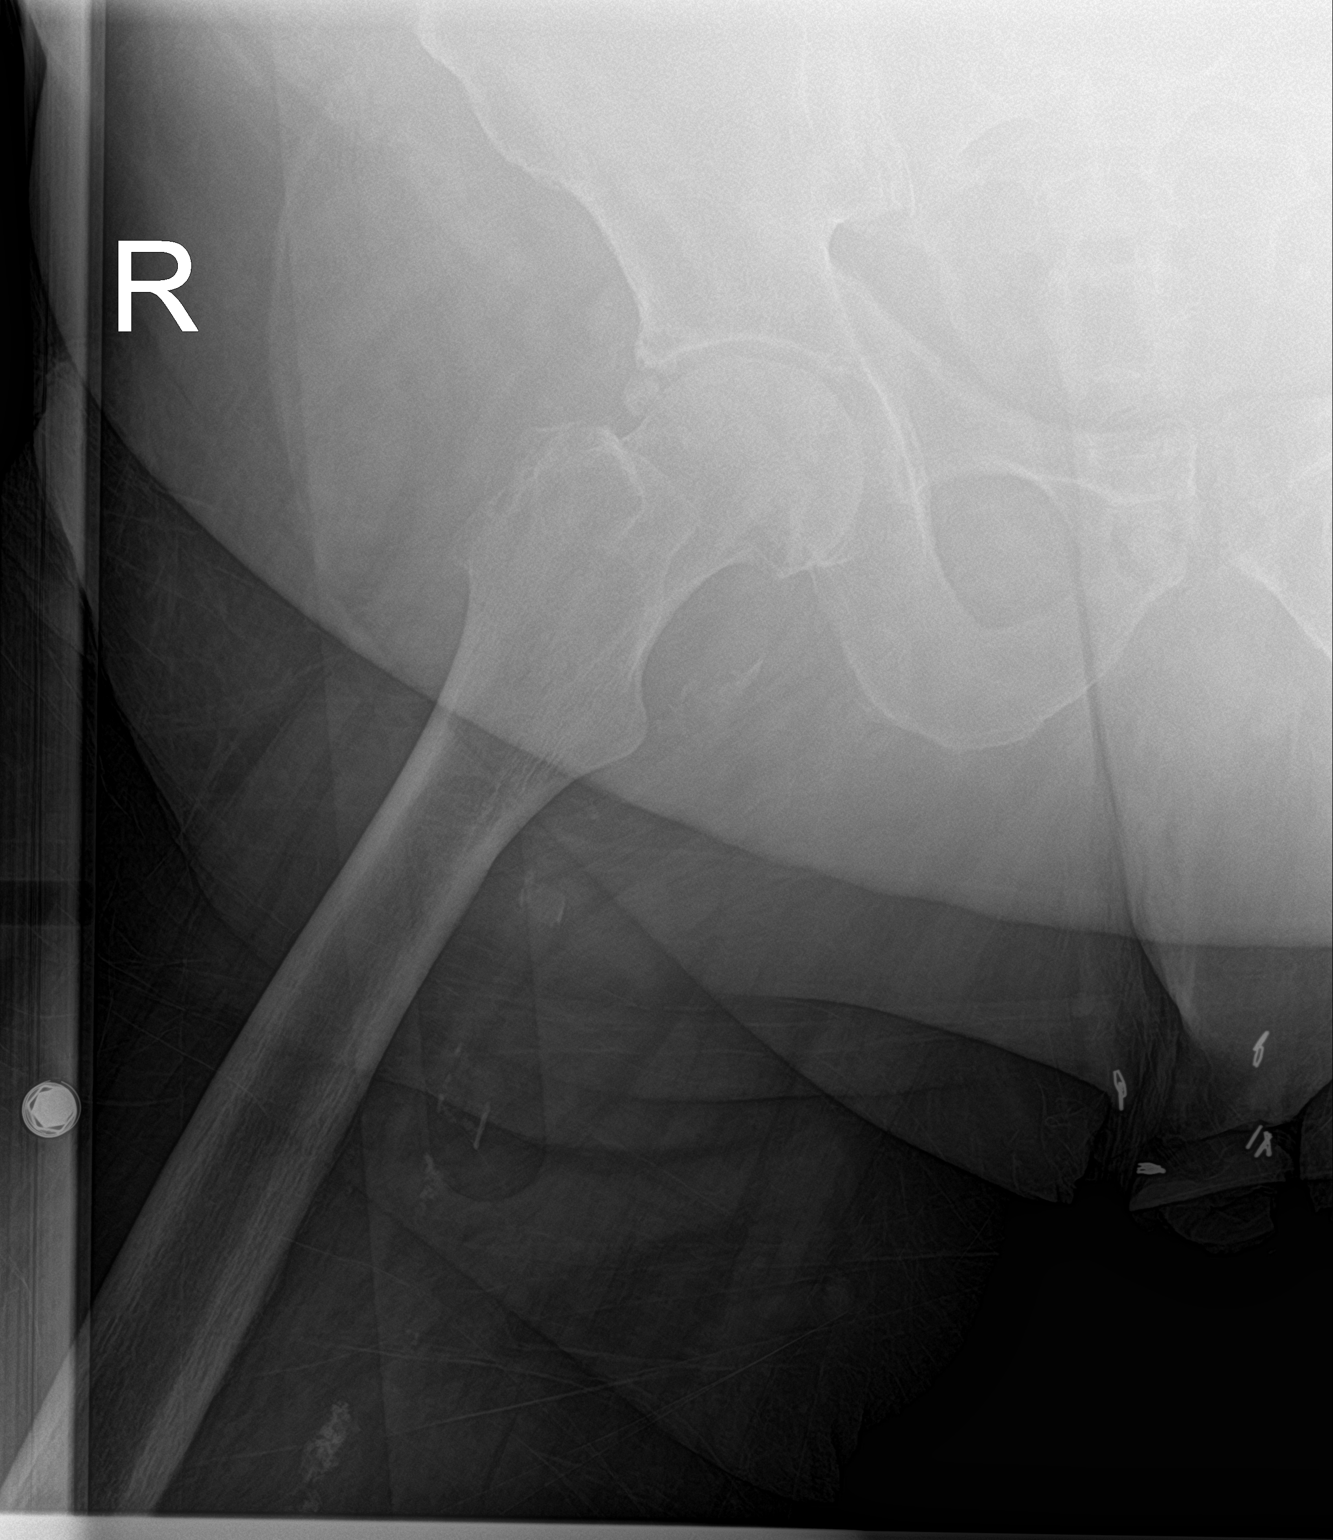

[pelvis ap]
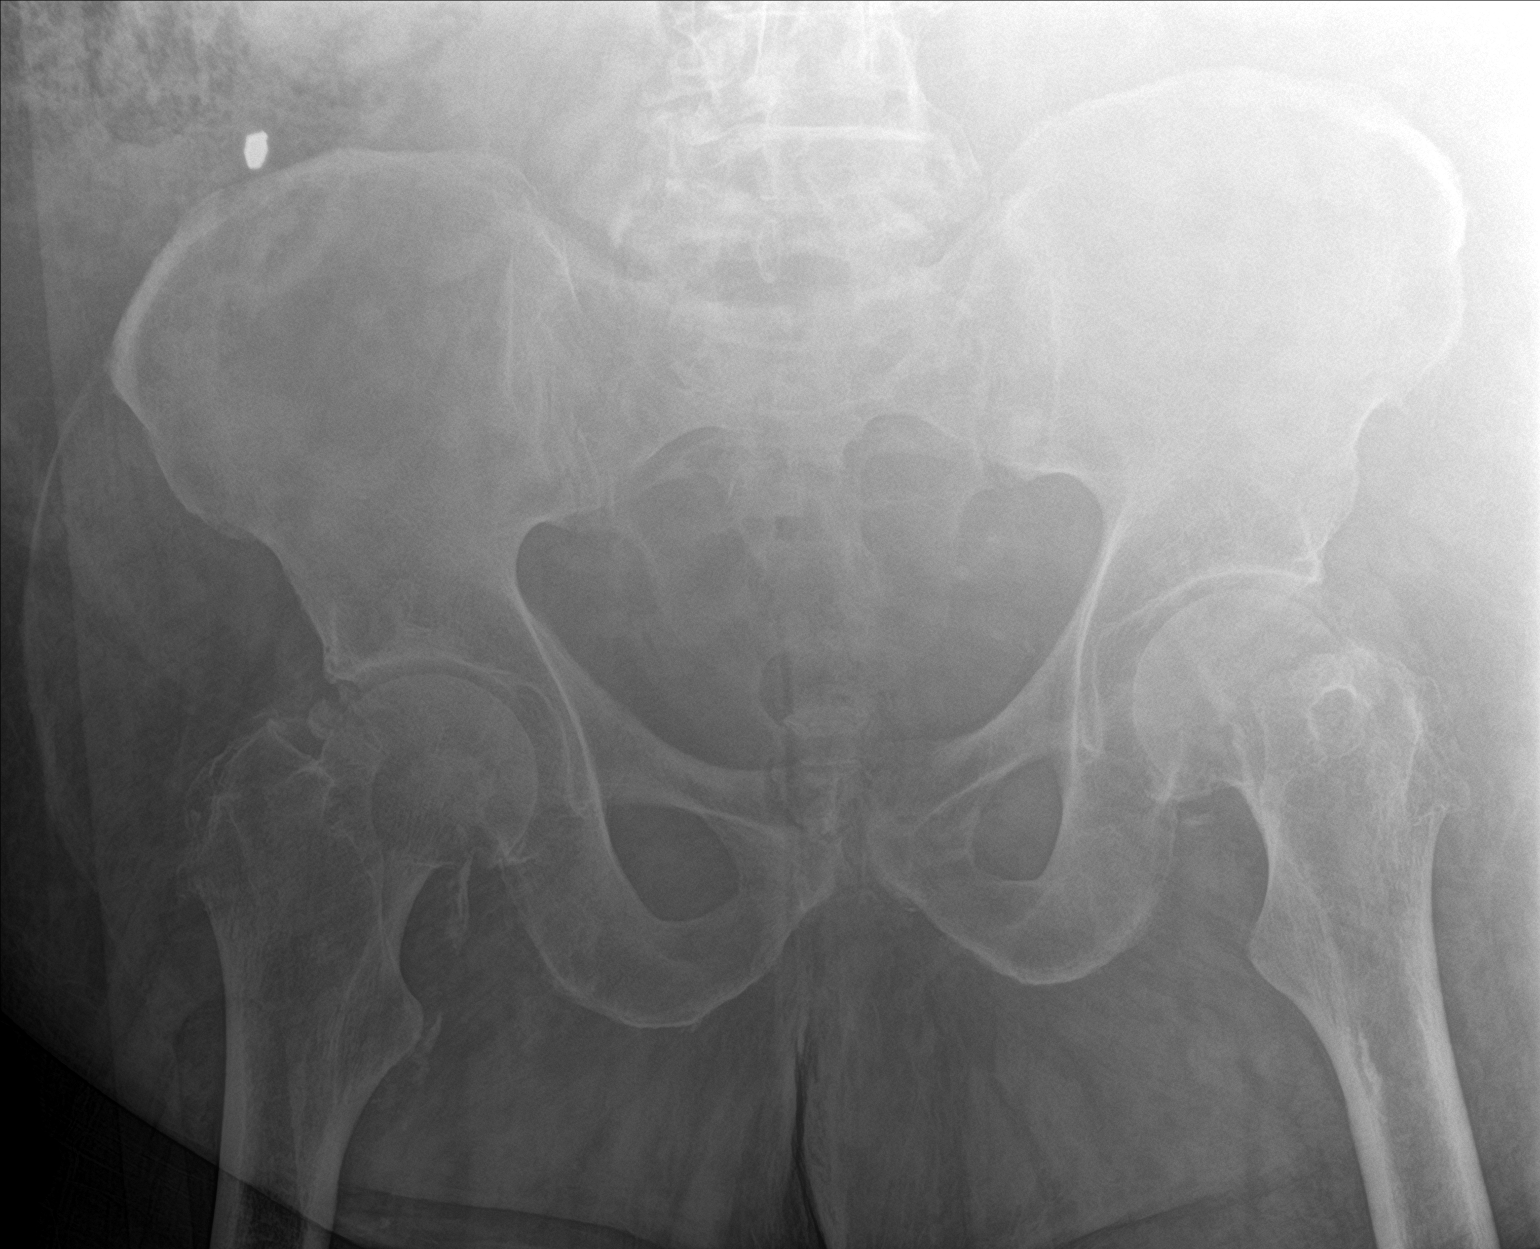

[3 of 3 positions shown; findings below may reference images not displayed]

FINDINGS: There is no evidence of hip fracture or dislocation. Mild
degenerative change of the right hip..
IMPRESSION: No acute osseous abnormality of the right hip.

## 2018-09-29 IMAGING — CR DG LUMBAR SPINE COMPLETE 4+V
5 series · 5 of 5 positions shown · non-contrast
Comparison: CT abdomen pelvis [DATE]

CLINICAL DATA: Fall with back pain

EXAM:
LUMBAR SPINE - COMPLETE 4+ VIEW

[l-spine ap]
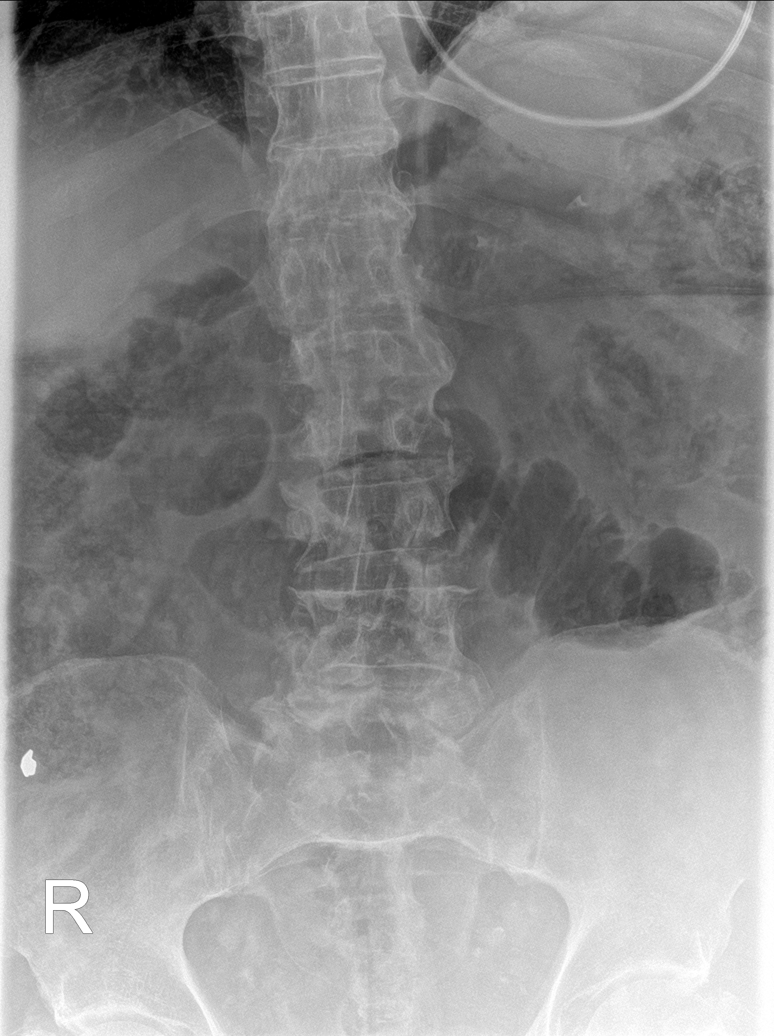

[l-spine obl (1 of 2)]
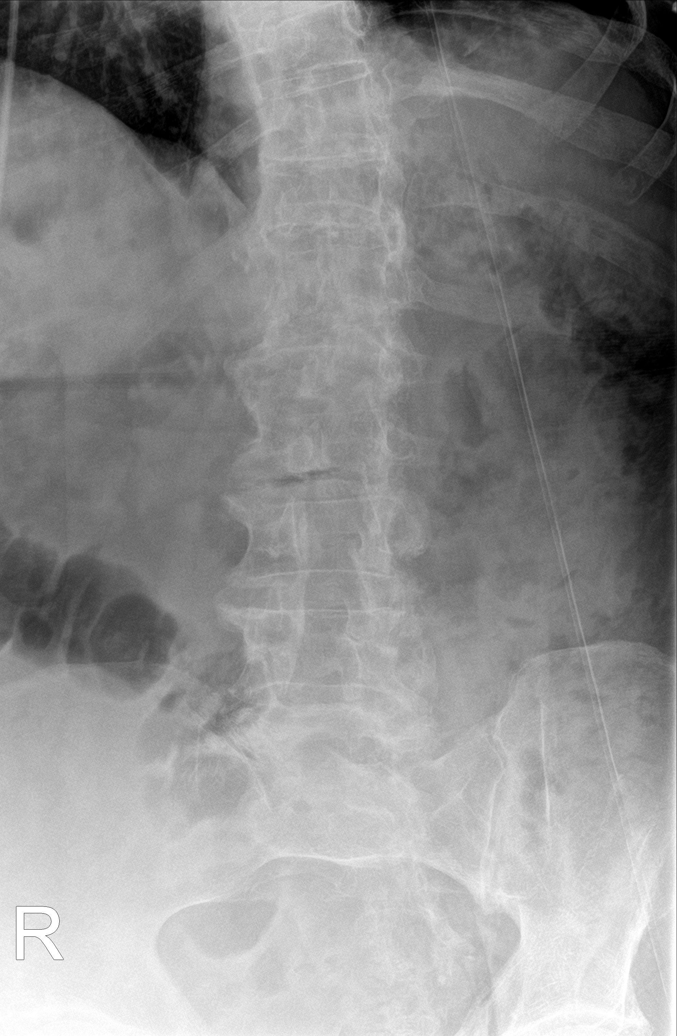

[l-spine obl (2 of 2)]
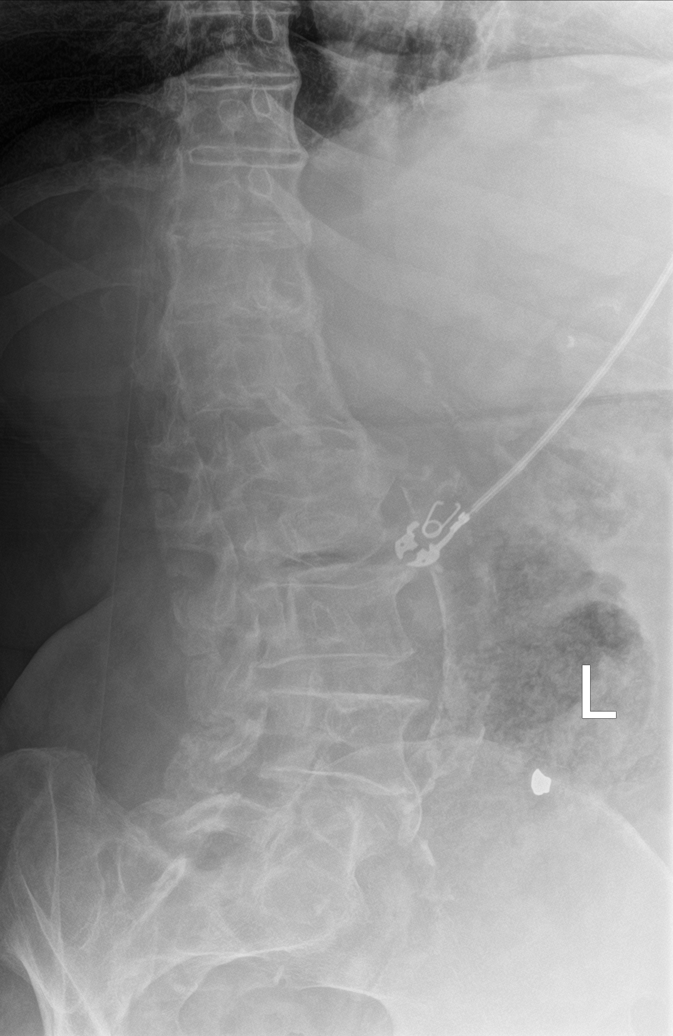

[l-spine lat]
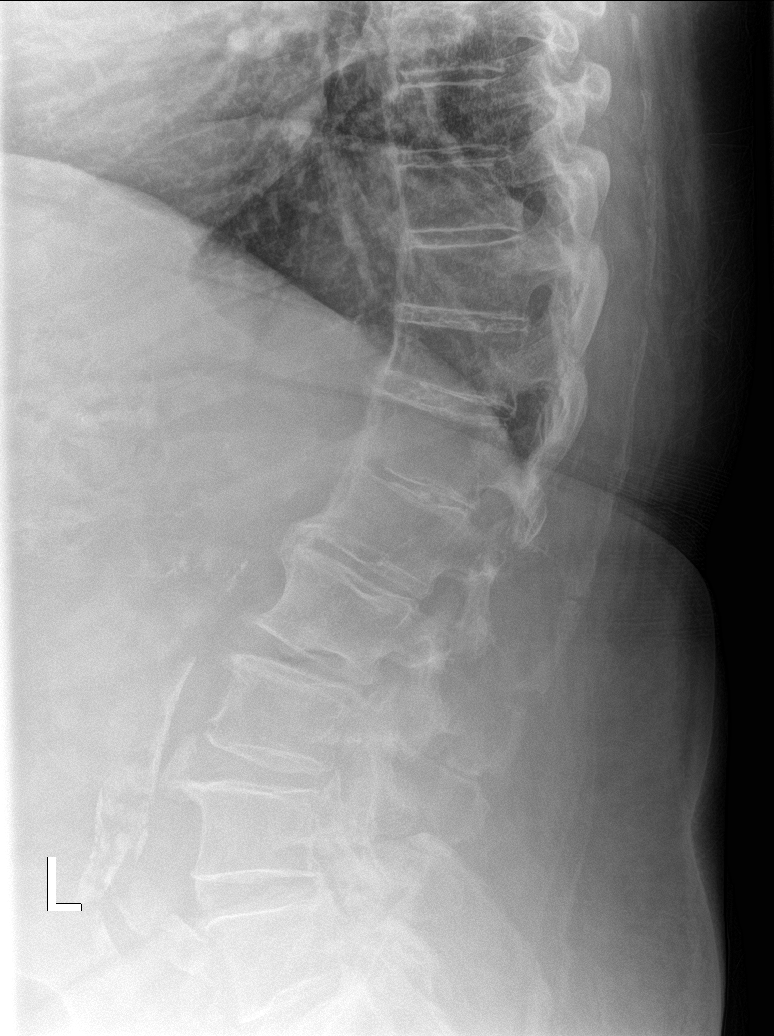

[l-spine spot]
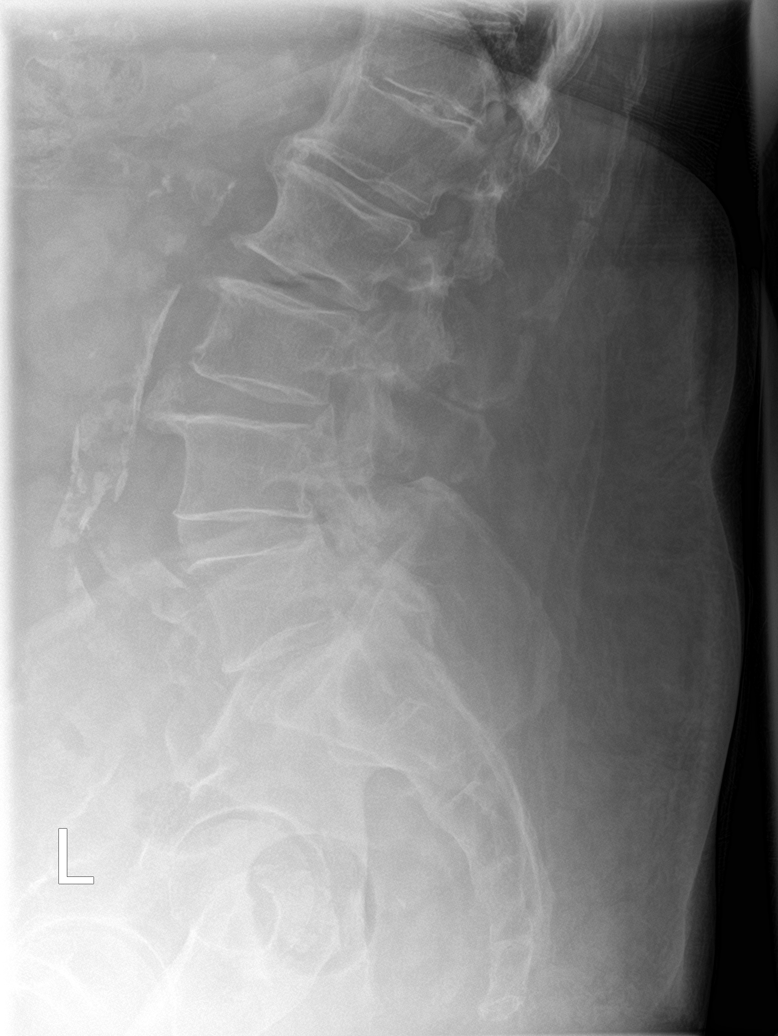

[5 of 5 positions shown; findings below may reference images not displayed]

FINDINGS: There is no evidence of lumbar spine fracture. Alignment is normal.
There are flowing thoracic syndesmophytes. Calcific aortic
atherosclerosis. Mild multilevel vertebral body height loss.
IMPRESSION: No acute fracture or static subluxation of the lumbar spine.

## 2018-09-29 MED ORDER — PANTOPRAZOLE SODIUM 40 MG PO TBEC
40.0000 mg | DELAYED_RELEASE_TABLET | Freq: Every day | ORAL | Status: DC
  Administered 2018-09-30 – 2018-10-08 (×9): 40 mg via ORAL
  Filled 2018-09-29 (×9): qty 1

## 2018-09-29 MED ORDER — SODIUM CHLORIDE 0.9% FLUSH
3.0000 mL | Freq: Two times a day (BID) | INTRAVENOUS | Status: DC
  Administered 2018-10-01 – 2018-10-02 (×2): 3 mL via INTRAVENOUS

## 2018-09-29 MED ORDER — HYDROCODONE-ACETAMINOPHEN 5-325 MG PO TABS: 1.0000 | ORAL_TABLET | Freq: Four times a day (QID) | ORAL | 0 refills | Status: DC | PRN

## 2018-09-29 MED ORDER — FOLIC ACID 1 MG PO TABS
1.0000 mg | ORAL_TABLET | Freq: Every day | ORAL | Status: DC
  Administered 2018-09-30 – 2018-10-08 (×9): 1 mg via ORAL
  Filled 2018-09-29 (×9): qty 1

## 2018-09-29 MED ORDER — SODIUM CHLORIDE 0.9% FLUSH
3.0000 mL | INTRAVENOUS | Status: DC | PRN
  Administered 2018-09-29 – 2018-10-02 (×2): 3 mL via INTRAVENOUS
  Filled 2018-09-29: qty 3

## 2018-09-29 MED ORDER — ACETAMINOPHEN 650 MG RE SUPP: 650.0000 mg | Freq: Four times a day (QID) | RECTAL | Status: DC | PRN

## 2018-09-29 MED ORDER — POLYETHYLENE GLYCOL 3350 17 G PO PACK
17.0000 g | PACK | Freq: Every day | ORAL | Status: DC | PRN
  Administered 2018-10-02 – 2018-10-05 (×3): 17 g via ORAL
  Filled 2018-09-29 (×3): qty 1

## 2018-09-29 MED ORDER — INSULIN ASPART 100 UNIT/ML ~~LOC~~ SOLN: 0.0000 [IU] | Freq: Every day | SUBCUTANEOUS | Status: DC

## 2018-09-29 MED ORDER — HEPARIN SODIUM (PORCINE) 5000 UNIT/ML IJ SOLN
5000.0000 [IU] | Freq: Three times a day (TID) | INTRAMUSCULAR | Status: DC
  Administered 2018-09-29 – 2018-10-02 (×7): 5000 [IU] via SUBCUTANEOUS
  Filled 2018-09-29 (×7): qty 1

## 2018-09-29 MED ORDER — ACETAMINOPHEN 325 MG PO TABS: 650.0000 mg | ORAL_TABLET | Freq: Four times a day (QID) | ORAL | Status: DC | PRN

## 2018-09-29 MED ORDER — MORPHINE SULFATE (PF) 2 MG/ML IV SOLN
2.0000 mg | INTRAVENOUS | Status: DC | PRN
  Administered 2018-09-30: 2 mg via INTRAVENOUS
  Filled 2018-09-29: qty 1

## 2018-09-29 MED ORDER — AMLODIPINE BESYLATE 10 MG PO TABS
10.0000 mg | ORAL_TABLET | Freq: Every day | ORAL | Status: DC
  Administered 2018-09-30 – 2018-10-08 (×9): 10 mg via ORAL
  Filled 2018-09-29 (×9): qty 1

## 2018-09-29 MED ORDER — MORPHINE SULFATE (PF) 4 MG/ML IV SOLN
4.0000 mg | Freq: Once | INTRAVENOUS | Status: AC
  Administered 2018-09-29: 4 mg via INTRAVENOUS
  Filled 2018-09-29: qty 1

## 2018-09-29 MED ORDER — LORAZEPAM 2 MG/ML IJ SOLN: 1.0000 mg | Freq: Four times a day (QID) | INTRAMUSCULAR | Status: DC | PRN

## 2018-09-29 MED ORDER — LORAZEPAM 1 MG PO TABS: 1.0000 mg | ORAL_TABLET | Freq: Four times a day (QID) | ORAL | Status: DC | PRN

## 2018-09-29 MED ORDER — VITAMIN B-1 100 MG PO TABS
100.0000 mg | ORAL_TABLET | Freq: Every day | ORAL | Status: DC
  Administered 2018-09-30 – 2018-10-08 (×9): 100 mg via ORAL
  Filled 2018-09-29 (×9): qty 1

## 2018-09-29 MED ORDER — PRAVASTATIN SODIUM 10 MG PO TABS
10.0000 mg | ORAL_TABLET | Freq: Every day | ORAL | Status: DC
  Administered 2018-09-29 – 2018-10-07 (×9): 10 mg via ORAL
  Filled 2018-09-29 (×9): qty 1

## 2018-09-29 MED ORDER — ADULT MULTIVITAMIN W/MINERALS CH
1.0000 | ORAL_TABLET | Freq: Every day | ORAL | Status: DC
  Administered 2018-09-30 – 2018-10-08 (×9): 1 via ORAL
  Filled 2018-09-29 (×9): qty 1

## 2018-09-29 MED ORDER — HYDROCODONE-ACETAMINOPHEN 5-325 MG PO TABS
1.0000 | ORAL_TABLET | Freq: Once | ORAL | Status: AC
  Administered 2018-09-29: 1 via ORAL
  Filled 2018-09-29: qty 1

## 2018-09-29 MED ORDER — SODIUM CHLORIDE 0.9 % IV SOLN: 250.0000 mL | INTRAVENOUS | Status: DC | PRN

## 2018-09-29 MED ORDER — INSULIN ASPART 100 UNIT/ML ~~LOC~~ SOLN
0.0000 [IU] | Freq: Three times a day (TID) | SUBCUTANEOUS | Status: DC
  Administered 2018-09-30: 2 [IU] via SUBCUTANEOUS
  Administered 2018-10-01: 1 [IU] via SUBCUTANEOUS
  Administered 2018-10-01: 2 [IU] via SUBCUTANEOUS
  Administered 2018-10-01: 1 [IU] via SUBCUTANEOUS

## 2018-09-29 NOTE — ED Triage Notes (Addendum)
Pt DC home this morning after Dx of broken toes. Pt called EMS because He reports due to knee pain he can not walk. Pt insisted to return to ED to be revaluated for difficulty walking. Knee pain is chronic.

## 2018-09-29 NOTE — ED Notes (Signed)
ED TO INPATIENT HANDOFF REPORT  ED Nurse Name and Phone #:  Chong Sicilian, RN [phone]  S Name/Age/Gender Brandon [name] 73 y.o. male Room/Bed: 007C/007C  Code Status   Code Status: Prior  Home/SNF/Other Home Patient oriented to: self, place, time and situation Is this baseline? Yes   Triage Complete: Triage complete  Chief Complaint Toe Pain   Triage Note Pt DC home this morning after Dx of broken toes. Pt called EMS because He reports due to knee pain he can not walk. Pt insisted to return to ED to be revaluated for difficulty walking. Knee pain is chronic.   Allergies No Known Allergies  Level of Care/Admitting Diagnosis ED Disposition    ED Disposition Condition [facility] Area: [place] [[id number]]  Level of Care: Med-Surg [16]  Diagnosis: Patella fracture [[id number]]  Admitting Physician: Benay Pike [[id number]]  Attending Physician: Mingo Amber, JEFFREY H [[id number]]  Estimated length of stay: past midnight tomorrow  Certification:: I certify this patient will need inpatient services for at least 2 midnights  PT Class (Do Not Modify): Inpatient [101]  PT Acc Code (Do Not Modify): Private [1]       B Medical/Surgery History Past Medical History:  Diagnosis Date  . Cancer ([place])   . Colon cancer ([place])   . Coronary artery disease    Past Surgical History:  Procedure Laterality Date  . CORONARY ARTERY BYPASS GRAFT       A IV Location/Drains/Wounds Patient Lines/Drains/Airways Status   Active Line/Drains/Airways    Name:   Placement date:   Placement time:   Site:   Days:   Peripheral IV 09/29/18 Right Hand   09/29/18    2005    Hand   less than 1          Intake/Output Last 24 hours No intake or output data in the 24 hours ending 09/29/18 2006  Labs/Imaging No results found for this or any previous visit (from the past 3 hour(s)). Dg Lumbar Spine Complete  Result Date: 09/29/2018 CLINICAL DATA:  Fall with back pain EXAM: LUMBAR  SPINE - COMPLETE 4+ VIEW COMPARISON:  CT abdomen pelvis 08/24/2018 FINDINGS: There is no evidence of lumbar spine fracture. Alignment is normal. There are flowing thoracic syndesmophytes. Calcific aortic atherosclerosis. Mild multilevel vertebral body height loss. IMPRESSION: No acute fracture or static subluxation of the lumbar spine. Electronically Signed   By: Ulyses Jarred M.D.   On: 09/29/2018 06:18   Dg Knee 2 Views Right  Result Date: 09/29/2018 CLINICAL DATA:  Fall from standing.  Pain and stiffness. EXAM: RIGHT KNEE - 1-2 VIEW COMPARISON:  None. FINDINGS: Acute avulsion fracture lower pole of patella and alignment. Mild high-riding patella. Severe medial compartment narrowing,, moderate within lateral compartment with marginal spurring and periarticular sclerosis. No destructive bony lesions. Advanced vascular calcifications. Large suprapatellar joint effusion. Prepatellar soft tissue swelling. IMPRESSION: 1. Acute patella avulsion fracture. Large suprapatellar joint effusion. 2. Severe medial compartment osteoarthrosis. Electronically Signed   By: Elon Alas M.D.   On: 09/29/2018 17:39   Dg Ankle Complete Right  Result Date: 09/29/2018 CLINICAL DATA:  Mechanical fall with foot pain. EXAM: RIGHT ANKLE - COMPLETE 3+ VIEW COMPARISON:  None. FINDINGS: Joint effusion and soft tissue swelling without acute fracture or malalignment. Periarticular calcifications without erosion, nonspecific. Heel spurs and osteopenia. IMPRESSION: Soft tissue swelling and joint effusion without acute fracture. Electronically Signed   By: Monte Fantasia M.D.   On: 09/29/2018 06:28  Dg Foot Complete Right  Result Date: 09/29/2018 CLINICAL DATA:  Mechanical fall.  Foot pain. EXAM: RIGHT FOOT COMPLETE - 3+ VIEW COMPARISON:  None. FINDINGS: Second proximal phalanx proximal shaft fracture with minimal lateral impaction. Nondisplaced transverse shaft fracture of the first proximal phalanx. Osteopenia.  Hallux valgus and  bunion. IMPRESSION: Nondisplaced first and second proximal phalanx fractures. Electronically Signed   By: Monte Fantasia M.D.   On: 09/29/2018 06:30   Dg Hip Unilat W Or Wo Pelvis 2-3 Views Right  Result Date: 09/29/2018 CLINICAL DATA:  Fall with right hip pain EXAM: DG HIP (WITH OR WITHOUT PELVIS) 2-3V RIGHT COMPARISON:  None. FINDINGS: There is no evidence of hip fracture or dislocation. Mild degenerative change of the right hip. IMPRESSION: No acute osseous abnormality of the right hip. Electronically Signed   By: Ulyses Jarred M.D.   On: 09/29/2018 06:20    Pending Labs Unresulted Labs (From admission, onward)    Start     Ordered   09/29/18 6712  Basic metabolic panel  Once,   STAT     09/29/18 1945   09/29/18 1946  CBC with Differential  Once,   STAT     09/29/18 1945          Vitals/Pain Today's Vitals   09/29/18 1549 09/29/18 1554 09/29/18 1642  BP: 102/77    Pulse: 83    Resp: 18    Temp: 97.8 F (36.6 C)    TempSrc: Oral    SpO2: 97%    Weight:  (!) 147 kg   Height:  5\' 6"  (1.676 m)   PainSc:  9  9     Isolation Precautions No active isolations  Medications Medications  morphine 4 MG/ML injection 4 mg (has no administration in time range)    Mobility non-ambulatory High fall risk   Focused Assessments none noted   R Recommendations: See Admitting Provider Note  Report given to:   Additional Notes:  none

## 2018-09-29 NOTE — Progress Notes (Addendum)
[facility] Admission History and Physical Service Pager: [phone]  Patient name: Phuoc Huy Medical record number: [id number] Date of birth: 1946-01-08 Age: 73 y.o. Gender: male  Primary Care Provider: Helane Rima, MD Consultants: Orthopedics Code Status: DNR/DNI (discussed on admission)   Chief Complaint: right knee pain   Assessment and Plan: Emerson Schreifels is a 73 y.o. male presenting with right patellar fracture requiring surgery s/p mechanical fall earlier this morning.  PMH is significant for MI,  DM2, HTN, HLD, gout, GERD, BPH, and alcohol abuse.   Right patellar avulsion fracture - patient had a mechanical fall this morning at about 3am.  He was transported to the [facility] ED via ambulance. Plain films obtained which showed nondisplaced first and second proximal phalanx fractures of the right foot - to be managed nonoperatively and was provided with a postop shoe.  However on his way out of the ED he felt his knee give out but was able to catch his fall and did not land on the joint.  He returned home however had increasing knee pain and unable to rise from a sitting position.  Was transported back to the ED via ambulance. On repeat imaging was found to have an acute R patellar avulsion fracture.  Ortho was consulted by EDP and patient was admitted with likely plan for surgery in the AM.   - admit to inpatient, med-surg, attending Dr. Nori Riis - ortho consulted appreciate recs  - f/u MRI R knee wo contrast  - NPO @ [facility]  - pain control: morphine 2mg  q4h and tylenol PRN - AM CBC and BMP - heparin subcut - non weight bearing  - PT/OT consult - vitals per unit routine   T2DM Well controlled at home on Metformin 500 mg once daily.  Last Hgb A1c 7.2 one month ago in 07/2018.   -holding home Metformin  -sSSI   -monitor CBGs   Alcohol use disorder - patient admits to drinking 8 beers every day.  States the last time he went more than a day without drinking was  the last time he was in the hospital.  He states he did not experience seizures or withdrawal symptoms at that time.   - monitor on CIWA - Ativan prn per protocol - folate, thiamine, multivitamin  HTN -  Soft BP on admission of 98/63. Patient unsure exactly which medications he takes for hypertension.  Per chart review of home medications, he is listed as taking lisinopril, norvasc, clonidine. Propranolol is also listed under medications but he is unsure why or if he is taking it.  Discussed common uses, he denies h/o migraine or essential tremors.  - continue amlodipine - hold lisinopril - hold clonodine - hold propranolol  - monitor blood pressures  HLD - Stable. Patient takes pravastatin 10 mg daily at home.   - continue pravastatin  Gout - patient unsure if he is taking this medication for gout. No clear documentation of gout in his records.  - hold home allopurinol   GERD - Stable, patient takes 10 mg prilosec at home daily  - protonix 40mg  qdaily    BPH - patient takes flomax 0.4 mg at bedtime and finasteride 5 mg at home.  - holding home meds. Consider restarting after surgery.   FEN/GI: NPO at [facility] in anticipation for OR in AM  Prophylaxis: heparin subQ   Disposition: admit to med-surg, attending Dr. Nori Riis   History of Present Illness:  Kelly Eisler is a 73 y.o. male  presenting with right knee pain.  He sustained a mechanical fall around 3AM in which he tripped over the corner of a cabinet.  Patient was transported to [facility] ED where XR showed nondisplaced fractures of first 2 toes. He was told it was a non-surgical issue and he was sent home with a boot. Was able to bear weight while leaving at that time. When he was getting into his car after leaving the loading dock his knee gave out and he almost fell onto his knee but was able to catch himself without landing on it.  Went home for about 3 hours but due to increasing right knee pain he presented back to the ED, where he was found to  have a right patellar fracture.   No prior history of knee injury.  No balance issues. He reports hx of T2DM, HLD, HTN, BPH and GERD.  Of note, he is unsure which HTN medications he takes or why he takes propranolol.   He states he drinks 8 beers every day with his last drink being yesterday.  He has not had issues with withdrawals before.  He does not smoke.   Review Of Systems: Per HPI with the following additions:   Review of Systems  Constitutional: Negative for chills and fever.  HENT: Negative for sore throat.   Respiratory: Negative for cough and sputum production.   Cardiovascular: Negative for chest pain.  Gastrointestinal: Negative for abdominal pain, diarrhea, nausea and vomiting.  Musculoskeletal: Positive for falls and joint pain.  Neurological: Negative for dizziness, weakness and headaches.  Psychiatric/Behavioral: Negative for depression and suicidal ideas.   Patient Active Problem List   Diagnosis Date Noted  . Patella fracture 09/29/2018  . Hyponatremia 08/23/2018  . Obstipation 08/23/2018  . Colon cancer ([place]) 08/23/2018  . CAD (coronary artery disease) 08/23/2018  . DM (diabetes mellitus), type 2 ([place]) 08/23/2018  . Pyuria 08/23/2018   Past Medical History: Past Medical History:  Diagnosis Date  . Cancer ([place])   . Colon cancer ([place])   . Coronary artery disease    Past Surgical History: Past Surgical History:  Procedure Laterality Date  . CORONARY ARTERY BYPASS GRAFT     Social History: Social History   Tobacco Use  . Smoking status: Former Smoker    Types: Cigarettes  . Smokeless tobacco: Never Used  Substance Use Topics  . Alcohol use: Yes  . Drug use: Never   Additional social history: Lives at home with his mother. Former tobacco use, quit 30 years ago.  Alcohol use about 8 beers a day with last drink yesterday.  No illicit drug use.  Please also refer to relevant sections of EMR.  Family History: Family History  Problem Relation Age of Onset   . Colon cancer Father   . CAD Other   . Colon cancer Other   . Diabetes Neg Hx    Allergies and Medications: No Known Allergies No current facility-administered medications on file prior to encounter.    Current Outpatient Medications on File Prior to Encounter  Medication Sig Dispense Refill  . allopurinol (ZYLOPRIM) 300 MG tablet Take 300 mg by mouth daily.    Marland Kitchen amLODipine (NORVASC) 10 MG tablet Take 10 mg by mouth daily.    Marland Kitchen aspirin 81 MG tablet Take 81 mg by mouth daily.    Marland Kitchen doxycycline (VIBRA-TABS) 100 MG tablet Take 1 tablet (100 mg total) by mouth every 12 (twelve) hours. 10 tablet 0  . ferrous sulfate 325 (65 FE)  MG tablet Take 325 mg by mouth daily.    . finasteride (PROSCAR) 5 MG tablet Take 5 mg by mouth daily.    Marland Kitchen HYDROcodone-acetaminophen (NORCO/VICODIN) 5-325 MG tablet Take 1 tablet by mouth every 6 (six) hours as needed for severe pain. 10 tablet 0  . ibuprofen (ADVIL,MOTRIN) 200 MG tablet Take 400 mg by mouth every 6 (six) hours as needed for mild pain.    Marland Kitchen lisinopril (PRINIVIL,ZESTRIL) 40 MG tablet Take 1 tablet (40 mg total) by mouth daily.    . magnesium oxide (MAG-OX) 400 (241.3 Mg) MG tablet Take 1 tablet (400 mg total) by mouth daily. 60 tablet 2  . metFORMIN (GLUCOPHAGE-XR) 500 MG 24 hr tablet Take 500 mg by mouth every morning.    . Multiple Vitamin (MULTIVITAMIN) tablet Take 1 tablet by mouth daily.    . NONFORMULARY OR COMPOUNDED ITEM Apply 1 application topically 2 (two) times daily as needed. Triamcinolone 0.1% and Aquaphor compound steroid cream, 3:1 ratio, 1 pound jar    . omeprazole (PRILOSEC) 10 MG capsule Take 10 mg by mouth daily.    . pravastatin (PRAVACHOL) 10 MG tablet Take 10 mg by mouth at bedtime.    . propranolol (INDERAL) 40 MG tablet Take 1 tablet (40 mg total) by mouth 2 (two) times daily.    . tamsulosin (FLOMAX) 0.4 MG CAPS capsule Take 0.4 mg by mouth at bedtime.    . cloNIDine (CATAPRES) 0.2 MG tablet Take 0.2 mg by mouth 2 (two) times  daily.    . furosemide (LASIX) 20 MG tablet Take 1 tablet (20 mg total) by mouth daily. (Patient not taking: Reported on 09/29/2018)    . Misc. Devices MISC Apply 1 application topically 2 (two) times daily. Triamcinolone 0.1% cream with Aquaphor mixed 3:1, Apply BID prn, Disp 1 lb      Objective: BP 102/77 (BP Location: Right Arm)   Pulse 83   Temp 97.8 F (36.6 C) (Oral)   Resp 18   Ht 5\' 6"  (1.676 m)   Wt (!) 147 kg   SpO2 97%   BMI 52.31 kg/m    Exam: General: alert and oriented. No acute distress.  Eyes: EOMI, PERRL, no scleral icterus.  ENTM: moist oral mucosa. No oropharyngeal erythema or exudates.  Neck: no thyromegaly, no LAD Cardiovascular: regular rhythm. Normal rate. No murmurs. Some trace edema of the ankles bilaterally.  2+ radial and DP bilaterally Respiratory: lungs clear to auscultation bilaterally. Some diminished breath sounds. No wheezing or crackles.  Gastrointestinal: large pannus, nontender.  Ventral midline hernia. Bowel sounds present.  MSK: right leg in knee immobilizer. Patient able to move other 3 extremiteis spontaneously.    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  He has several small wounds caused by excoriation along his shins, forearms, and face.  No skin ulcerations seen.  Neuro: Cranial nerves grossly intact.   Psych: pleasant affect. Makes eye contact. Spontaneous speech.   Labs and Imaging: CBC BMET  No results for input(s): WBC, HGB, HCT, PLT in the last 168 hours. No results for input(s): NA, K, CL, CO2, BUN, CREATININE, GLUCOSE, CALCIUM in the last 168 hours.   Addison Naegeli MD [place], PGY-1 [facility] Intern pager: [phone], text pages welcome  I have seen and evaluated the patient with Dr. Jeannine Kitten and agree with the documentation above.  I have included my edits in blue.   Lovenia Kim, MD 09/29/2018, 11:25 PM PGY-3, [place]

## 2018-09-29 NOTE — Care Management (Signed)
ED CM consulted concerning patient's 2nd visit to the ED today. Patient was here earlier with fractured toes.  Patient is back with knee pain unable to bear weight he reports while  attempting to get out of the car his leg buckled under him.  ED evaluation still in progress. CM will continue to follow for CM needs.

## 2018-09-29 NOTE — ED Provider Notes (Signed)
[facility] EMERGENCY DEPARTMENT Provider Note   CSN: [id number] Arrival date & time: 09/29/18  1551    History   Chief Complaint Chief Complaint  Patient presents with  . Toe Pain    HPI Brandon [name] is a 73 y.o. male.     Patient is a 73 year old male with past medical history of morbid obesity, diabetes, coronary artery disease who presents emergency department for pain and inability to ambulate.  Patient was seen early this morning in the emergency department after mechanical fall.  He suffered fractures in the right toes.  X-rays of the hips, lumbar spine, ankle were normal.  Patient reports that usually he gets around with a cane or walker at home.  Reports that while he was being assisted back into his vehicle after his emergency department visit this morning he twisted his knee because it was difficult to get in the car.  Reports that it took 4 men to get him out of the car and into his house and into his recliner.  Reports that he has been and unable to get out of his recliner and into his wheelchair due to the pain and due to his right knee giving out.  Reports that he has a history of surgery on this knee in the past.  Reports that he does not feel safe at home because he lives with his elderly mother who cannot take care of him and he is unable to transfer himself from his recliner into his wheelchair.  Reports that even with pain medication he is unable to do so and does not feel safe.     Past Medical History:  Diagnosis Date  . Cancer ([place])   . Colon cancer ([place])   . Coronary artery disease     Patient Active Problem List   Diagnosis Date Noted  . Patella fracture 09/29/2018  . Hyponatremia 08/23/2018  . Obstipation 08/23/2018  . Colon cancer ([place]) 08/23/2018  . CAD (coronary artery disease) 08/23/2018  . DM (diabetes mellitus), type 2 ([place]) 08/23/2018  . Pyuria 08/23/2018    Past Surgical History:  Procedure Laterality Date  . CORONARY  ARTERY BYPASS GRAFT          Home Medications    Prior to Admission medications   Medication Sig Start Date End Date Taking? Authorizing Provider  allopurinol (ZYLOPRIM) 300 MG tablet Take 300 mg by mouth daily. 08/12/18  Yes [provider]  amLODipine (NORVASC) 10 MG tablet Take 10 mg by mouth daily. 02/18/18  Yes [provider]  aspirin 81 MG tablet Take 81 mg by mouth daily.   Yes [provider]  doxycycline (VIBRA-TABS) 100 MG tablet Take 1 tablet (100 mg total) by mouth every 12 (twelve) hours. 08/25/18  Yes Schertz, Herbie Baltimore, MD  ferrous sulfate 325 (65 FE) MG tablet Take 325 mg by mouth daily. 11/15/15 11/15/19 Yes [provider]  finasteride (PROSCAR) 5 MG tablet Take 5 mg by mouth daily. 02/18/18  Yes [provider]  HYDROcodone-acetaminophen (NORCO/VICODIN) 5-325 MG tablet Take 1 tablet by mouth every 6 (six) hours as needed for severe pain. 09/29/18  Yes Ripley Fraise, MD  ibuprofen (ADVIL,MOTRIN) 200 MG tablet Take 400 mg by mouth every 6 (six) hours as needed for mild pain.   Yes [provider]  lisinopril (PRINIVIL,ZESTRIL) 40 MG tablet Take 1 tablet (40 mg total) by mouth daily. 08/29/18  Yes [name], MD  magnesium oxide (MAG-OX) 400 (241.3 Mg) MG tablet Take  1 tablet (400 mg total) by mouth daily. 08/25/18  Yes [name], MD  metFORMIN (GLUCOPHAGE-XR) 500 MG 24 hr tablet Take 500 mg by mouth every morning. 02/18/18  Yes [provider]  Multiple Vitamin (MULTIVITAMIN) tablet Take 1 tablet by mouth daily.   Yes [provider]  NONFORMULARY OR COMPOUNDED ITEM Apply 1 application topically 2 (two) times daily as needed. Triamcinolone 0.1% and Aquaphor compound steroid cream, 3:1 ratio, 1 pound jar 07/14/18  Yes [provider]  omeprazole (PRILOSEC) 10 MG capsule Take 10 mg by mouth daily. 02/18/18  Yes [provider]  pravastatin (PRAVACHOL) 10 MG tablet Take 10 mg by mouth at  bedtime. 02/18/18  Yes [provider]  propranolol (INDERAL) 40 MG tablet Take 1 tablet (40 mg total) by mouth 2 (two) times daily. 08/29/18  Yes [name], MD  tamsulosin (FLOMAX) 0.4 MG CAPS capsule Take 0.4 mg by mouth at bedtime. 02/18/18  Yes [provider]  cloNIDine (CATAPRES) 0.2 MG tablet Take 0.2 mg by mouth 2 (two) times daily. 02/18/18   [provider]  furosemide (LASIX) 20 MG tablet Take 1 tablet (20 mg total) by mouth daily. Patient not taking: Reported on 09/29/2018 08/29/18   [name], MD  Misc. Devices MISC Apply 1 application topically 2 (two) times daily. Triamcinolone 0.1% cream with Aquaphor mixed 3:1, Apply BID prn, Disp 1 lb    [provider]    Family History Family History  Problem Relation Age of Onset  . Colon cancer Father   . CAD Other   . Colon cancer Other   . Diabetes Neg Hx     Social History Social History   Tobacco Use  . Smoking status: Former Smoker    Types: Cigarettes  . Smokeless tobacco: Never Used  Substance Use Topics  . Alcohol use: Yes  . Drug use: Never     Allergies   Patient has no known allergies.   Review of Systems Review of Systems  Constitutional: Negative for fever.  Respiratory: Negative for cough and shortness of breath.   Cardiovascular: Negative for chest pain.  Gastrointestinal: Negative for abdominal pain, nausea and vomiting.  Musculoskeletal: Positive for arthralgias and gait problem. Negative for back pain, joint swelling, myalgias, neck pain and neck stiffness.  Skin: Negative for rash and wound.  Neurological: Negative for dizziness, weakness, light-headedness and headaches.  Hematological: Bruises/bleeds easily.     Physical Exam Updated Vital Signs BP 98/63 (BP Location: Right Arm)   Pulse 79   Temp 97.8 F (36.6 C)   Resp 17   Ht 5\' 6"  (1.676 m)   Wt (!) 147 kg   SpO2 95%   BMI 52.31 kg/m   Physical Exam Vitals signs and nursing note reviewed.    Constitutional:      General: He is not in acute distress.    Appearance: Normal appearance. He is obese. He is not ill-appearing, toxic-appearing or diaphoretic.  HENT:     Head: Normocephalic.  Eyes:     Conjunctiva/sclera: Conjunctivae normal.  Pulmonary:     Effort: Pulmonary effort is normal.  Skin:    General: Skin is dry.     Capillary Refill: Capillary refill takes less than 2 seconds.     Comments: Bilateral abrasions to the anterior shins.  Tenderness to palpation in the right toes.  Also in the anterior right knee.  Patient able to flex and extend the knee but not to its full extent.  Patient exam is limited due to pain and body habitus.  Neurological:     Mental Status: He is alert.  Psychiatric:        Mood and Affect: Mood normal.      ED Treatments / Results  Labs (all labs ordered are listed, but only abnormal results are displayed) Labs Reviewed  BASIC METABOLIC PANEL - Abnormal; Notable for the following components:      Result Value   Sodium 131 (*)    Glucose, Bld 118 (*)    All other components within normal limits  CBC WITH DIFFERENTIAL/PLATELET - Abnormal; Notable for the following components:   RBC 4.14 (*)    Hemoglobin 11.6 (*)    HCT 37.6 (*)    RDW 18.4 (*)    Monocytes Absolute 1.1 (*)    All other components within normal limits    EKG None  Radiology Dg Lumbar Spine Complete  Result Date: 09/29/2018 CLINICAL DATA:  Fall with back pain EXAM: LUMBAR SPINE - COMPLETE 4+ VIEW COMPARISON:  CT abdomen pelvis 08/24/2018 FINDINGS: There is no evidence of lumbar spine fracture. Alignment is normal. There are flowing thoracic syndesmophytes. Calcific aortic atherosclerosis. Mild multilevel vertebral body height loss. IMPRESSION: No acute fracture or static subluxation of the lumbar spine. Electronically Signed   By: Ulyses Jarred M.D.   On: 09/29/2018 06:18   Dg Knee 2 Views Right  Result Date: 09/29/2018 CLINICAL DATA:  Fall from standing.  Pain  and stiffness. EXAM: RIGHT KNEE - 1-2 VIEW COMPARISON:  None. FINDINGS: Acute avulsion fracture lower pole of patella and alignment. Mild high-riding patella. Severe medial compartment narrowing,, moderate within lateral compartment with marginal spurring and periarticular sclerosis. No destructive bony lesions. Advanced vascular calcifications. Large suprapatellar joint effusion. Prepatellar soft tissue swelling. IMPRESSION: 1. Acute patella avulsion fracture. Large suprapatellar joint effusion. 2. Severe medial compartment osteoarthrosis. Electronically Signed   By: Elon Alas M.D.   On: 09/29/2018 17:39   Dg Ankle Complete Right  Result Date: 09/29/2018 CLINICAL DATA:  Mechanical fall with foot pain. EXAM: RIGHT ANKLE - COMPLETE 3+ VIEW COMPARISON:  None. FINDINGS: Joint effusion and soft tissue swelling without acute fracture or malalignment. Periarticular calcifications without erosion, nonspecific. Heel spurs and osteopenia. IMPRESSION: Soft tissue swelling and joint effusion without acute fracture. Electronically Signed   By: Monte Fantasia M.D.   On: 09/29/2018 06:28   Dg Foot Complete Right  Result Date: 09/29/2018 CLINICAL DATA:  Mechanical fall.  Foot pain. EXAM: RIGHT FOOT COMPLETE - 3+ VIEW COMPARISON:  None. FINDINGS: Second proximal phalanx proximal shaft fracture with minimal lateral impaction. Nondisplaced transverse shaft fracture of the first proximal phalanx. Osteopenia.  Hallux valgus and bunion. IMPRESSION: Nondisplaced first and second proximal phalanx fractures. Electronically Signed   By: Monte Fantasia M.D.   On: 09/29/2018 06:30   Dg Hip Unilat W Or Wo Pelvis 2-3 Views Right  Result Date: 09/29/2018 CLINICAL DATA:  Fall with right hip pain EXAM: DG HIP (WITH OR WITHOUT PELVIS) 2-3V RIGHT COMPARISON:  None. FINDINGS: There is no evidence of hip fracture or dislocation. Mild degenerative change of the right hip. IMPRESSION: No acute osseous abnormality of the right hip.  Electronically Signed   By: Ulyses Jarred M.D.   On: 09/29/2018 06:20    Procedures Procedures (including critical care time)  Medications Ordered in ED Medications  morphine 4 MG/ML injection 4 mg (4 mg Intravenous Given 09/29/18 2011)     Initial Impression / Assessment and Plan /  ED Course  I have reviewed the triage vital signs and the nursing notes.  Pertinent labs & imaging results that were available during my care of the patient were reviewed by me and considered in my medical decision making (see chart for details).          Final Clinical Impressions(s) / ED Diagnoses   Final diagnoses:  Avulsion of right patellar tendon, initial encounter  Unable to ambulate  Accidental fall, initial encounter    ED Discharge Orders    None       Kristine Royal 09/29/18 2222    Charlesetta Shanks, MD 10/10/18 1300

## 2018-09-29 NOTE — Progress Notes (Signed)
Orthopedic Tech Progress Note Patient Details:  Kamaal Cast 24-Dec-1945 [id number]  Ortho Devices Type of Ortho Device: Knee Immobilizer Ortho Device/Splint Location: right  Ortho Device/Splint Interventions: Adjustment, Application, Ordered   Post Interventions Patient Tolerated: Well Instructions Provided: Care of device, Adjustment of device   Diera Wirkkala J Kentravious Lipford 09/29/2018, 6:19 PM

## 2018-09-29 NOTE — ED Notes (Signed)
Pt. Transported to xray 

## 2018-09-29 NOTE — ED Notes (Signed)
Patient transported to X-ray 

## 2018-09-29 NOTE — Consult Note (Signed)
ORTHOPAEDIC CONSULTATION  REQUESTING PHYSICIAN: Alveda Reasons, MD  Chief Complaint: Right knee and foot fractures  HPI: Brandon [name] is a 73 y.o. male with morbid obesity with a BMI of 52 and multiple medical comorbidities who has had a history of previous knee infection who has had washouts in the past.  He unfortunately had a fall and was unable to really stand yesterday he went home but was able to mobilize secondary to his knee.  He was diagnosed initially from the ER with nondisplaced first and second proximal phalanx fractures there is no initial concern about the knee.  He has had multiple surgeries on the right knee is not clear if he has had an extensor mechanism issue in the past.  Past Medical History:  Diagnosis Date  . Cancer ([place])   . Colon cancer ([place])   . Coronary artery disease    Past Surgical History:  Procedure Laterality Date  . CORONARY ARTERY BYPASS GRAFT     Social History   Socioeconomic History  . Marital status: Widowed    Spouse name: Not on file  . Number of children: Not on file  . Years of education: Not on file  . Highest education level: Not on file  Occupational History  . Not on file  Social Needs  . Financial resource strain: Not on file  . Food insecurity:    Worry: Not on file    Inability: Not on file  . Transportation needs:    Medical: Not on file    Non-medical: Not on file  Tobacco Use  . Smoking status: Former Smoker    Types: Cigarettes  . Smokeless tobacco: Never Used  Substance and Sexual Activity  . Alcohol use: Yes  . Drug use: Never  . Sexual activity: Not on file  Lifestyle  . Physical activity:    Days per week: Not on file    Minutes per session: Not on file  . Stress: Not on file  Relationships  . Social connections:    Talks on phone: Not on file    Gets together: Not on file    Attends religious service: Not on file    Active member of club or organization: Not on file    Attends meetings of  clubs or organizations: Not on file    Relationship status: Not on file  Other Topics Concern  . Not on file  Social History Narrative  . Not on file   Family History  Problem Relation Age of Onset  . Colon cancer Father   . CAD Other   . Colon cancer Other   . Diabetes Neg Hx    No Known Allergies Prior to Admission medications   Medication Sig Start Date End Date Taking? Authorizing Provider  allopurinol (ZYLOPRIM) 300 MG tablet Take 300 mg by mouth daily. 08/12/18  Yes [provider]  amLODipine (NORVASC) 10 MG tablet Take 10 mg by mouth daily. 02/18/18  Yes [provider]  aspirin 81 MG tablet Take 81 mg by mouth daily.   Yes [provider]  doxycycline (VIBRA-TABS) 100 MG tablet Take 1 tablet (100 mg total) by mouth every 12 (twelve) hours. 08/25/18  Yes Schertz, Herbie [place], MD  ferrous sulfate 325 (65 FE) MG tablet Take 325 mg by mouth daily. 11/15/15 11/15/19 Yes [provider]  finasteride (PROSCAR) 5 MG tablet Take 5 mg by mouth daily. 02/18/18  Yes [provider]  HYDROcodone-acetaminophen (NORCO/VICODIN) 5-325 MG tablet  Take 1 tablet by mouth every 6 (six) hours as needed for severe pain. 09/29/18  Yes Ripley Fraise, MD  ibuprofen (ADVIL,MOTRIN) 200 MG tablet Take 400 mg by mouth every 6 (six) hours as needed for mild pain.   Yes [provider]  lisinopril (PRINIVIL,ZESTRIL) 40 MG tablet Take 1 tablet (40 mg total) by mouth daily. 08/29/18  Yes [name], MD  magnesium oxide (MAG-OX) 400 (241.3 Mg) MG tablet Take 1 tablet (400 mg total) by mouth daily. 08/25/18  Yes [name], MD  metFORMIN (GLUCOPHAGE-XR) 500 MG 24 hr tablet Take 500 mg by mouth every morning. 02/18/18  Yes [provider]  Multiple Vitamin (MULTIVITAMIN) tablet Take 1 tablet by mouth daily.   Yes [provider]  NONFORMULARY OR COMPOUNDED ITEM Apply 1 application topically 2 (two) times daily as needed. Triamcinolone 0.1% and  Aquaphor compound steroid cream, 3:1 ratio, 1 pound jar 07/14/18  Yes [provider]  omeprazole (PRILOSEC) 10 MG capsule Take 10 mg by mouth daily. 02/18/18  Yes [provider]  pravastatin (PRAVACHOL) 10 MG tablet Take 10 mg by mouth at bedtime. 02/18/18  Yes [provider]  propranolol (INDERAL) 40 MG tablet Take 1 tablet (40 mg total) by mouth 2 (two) times daily. 08/29/18  Yes [name], MD  tamsulosin (FLOMAX) 0.4 MG CAPS capsule Take 0.4 mg by mouth at bedtime. 02/18/18  Yes [provider]  cloNIDine (CATAPRES) 0.2 MG tablet Take 0.2 mg by mouth 2 (two) times daily. 02/18/18   [provider]  furosemide (LASIX) 20 MG tablet Take 1 tablet (20 mg total) by mouth daily. Patient not taking: Reported on 09/29/2018 08/29/18   [name], MD  Misc. Devices MISC Apply 1 application topically 2 (two) times daily. Triamcinolone 0.1% cream with Aquaphor mixed 3:1, Apply BID prn, Disp 1 lb    [provider]   Dg Lumbar Spine Complete  Result Date: 09/29/2018 CLINICAL DATA:  Fall with back pain EXAM: LUMBAR SPINE - COMPLETE 4+ VIEW COMPARISON:  CT abdomen pelvis 08/24/2018 FINDINGS: There is no evidence of lumbar spine fracture. Alignment is normal. There are flowing thoracic syndesmophytes. Calcific aortic atherosclerosis. Mild multilevel vertebral body height loss. IMPRESSION: No acute fracture or static subluxation of the lumbar spine. Electronically Signed   By: Ulyses Jarred M.D.   On: 09/29/2018 06:18   Dg Knee 2 Views Right  Result Date: 09/29/2018 CLINICAL DATA:  Fall from standing.  Pain and stiffness. EXAM: RIGHT KNEE - 1-2 VIEW COMPARISON:  None. FINDINGS: Acute avulsion fracture lower pole of patella and alignment. Mild high-riding patella. Severe medial compartment narrowing,, moderate within lateral compartment with marginal spurring and periarticular sclerosis. No destructive bony lesions. Advanced vascular calcifications. Large  suprapatellar joint effusion. Prepatellar soft tissue swelling. IMPRESSION: 1. Acute patella avulsion fracture. Large suprapatellar joint effusion. 2. Severe medial compartment osteoarthrosis. Electronically Signed   By: Elon Alas M.D.   On: 09/29/2018 17:39   Dg Ankle Complete Right  Result Date: 09/29/2018 CLINICAL DATA:  Mechanical fall with foot pain. EXAM: RIGHT ANKLE - COMPLETE 3+ VIEW COMPARISON:  None. FINDINGS: Joint effusion and soft tissue swelling without acute fracture or malalignment. Periarticular calcifications without erosion, nonspecific. Heel spurs and osteopenia. IMPRESSION: Soft tissue swelling and joint effusion without acute fracture. Electronically Signed   By: Monte Fantasia M.D.   On: 09/29/2018 06:28   Dg Foot Complete Right  Result Date: 09/29/2018 CLINICAL DATA:  Mechanical fall.  Foot pain. EXAM: RIGHT FOOT  COMPLETE - 3+ VIEW COMPARISON:  None. FINDINGS: Second proximal phalanx proximal shaft fracture with minimal lateral impaction. Nondisplaced transverse shaft fracture of the first proximal phalanx. Osteopenia.  Hallux valgus and bunion. IMPRESSION: Nondisplaced first and second proximal phalanx fractures. Electronically Signed   By: Monte Fantasia M.D.   On: 09/29/2018 06:30   Dg Hip Unilat W Or Wo Pelvis 2-3 Views Right  Result Date: 09/29/2018 CLINICAL DATA:  Fall with right hip pain EXAM: DG HIP (WITH OR WITHOUT PELVIS) 2-3V RIGHT COMPARISON:  None. FINDINGS: There is no evidence of hip fracture or dislocation. Mild degenerative change of the right hip. IMPRESSION: No acute osseous abnormality of the right hip. Electronically Signed   By: Ulyses Jarred M.D.   On: 09/29/2018 06:20   Family History Reviewed and non-contributory, no pertinent history of problems with bleeding or anesthesia      Review of Systems 14 system ROS conducted and negative except for that noted in HPI   OBJECTIVE  Vitals: Patient Vitals for the past 8 hrs:  BP Temp Temp src  Pulse Resp SpO2 Height Weight  09/29/18 1554 - - - - - - '5\' 6"'$  (1.676 m) (!) 147 kg  09/29/18 1549 102/77 97.8 F (36.6 C) Oral 83 18 97 % - -   General: Alert, no acute distress Cardiovascular: Warm extremities noted Respiratory: No cyanosis, no use of accessory musculature GI: No organomegaly, abdomen is soft and non-tender Skin: No lesions in the area of chief complaint other than those listed below in MSK exam.  Neurologic: Sensation intact distally save for the below mentioned MSK exam Psychiatric: Patient is competent for consent with normal mood and affect Lymphatic: No swelling obvious and reported other than the area involved in the exam below Extremities  Right knee: Motion of the knee from 0 to 95 degrees he is got questionable knee extension on exam and is moderately tender in the anterior knee but is exam is somewhat questionable.  Ligaments exam was not able to be done secondary to patient discomfort.  Tender palpation of the midfoot and distal foot over the known fractures as well.  Warm well perfused foot otherwise.    Test Results Imaging X-rays of the foot demonstrate nondisplaced second and first proximal phalanx fractures of the toe and a high riding patella somewhat with a likely avulsion from the inferior border of the patellar tendon from the patella.  Labs cbc No results for input(s): WBC, HGB, HCT, PLT in the last 72 hours.  Labs inflam No results for input(s): CRP in the last 72 hours.  Invalid input(s): ESR  Labs coag No results for input(s): INR, PTT in the last 72 hours.  Invalid input(s): PT  No results for input(s): NA, K, CL, CO2, GLUCOSE, BUN, CREATININE, CALCIUM in the last 72 hours.   ASSESSMENT AND PLAN: 73 y.o. male with the following: Question extensor mechanism injury  Normally patient will be appropriate for outpatient management however due to his body habitus he was unable to mobilize.  He is being admitted to the hospitalist then keep  him n.p.o. at midnight would be appropriate and we will obtain an MRI of her neck to assess his extensor mechanism.  He may be a candidate for surgery but will hold on a plan until we have more information from the MRI.

## 2018-09-29 NOTE — ED Provider Notes (Signed)
[facility] EMERGENCY DEPARTMENT Provider Note   CSN: [id number] Arrival date & time: 09/29/18  9381    History   Chief Complaint Chief Complaint  Patient presents with  . Fall    HPI Brandon [name] is a 73 y.o. male.     The history is provided by the patient.  Fall  This is a new problem. The problem has been gradually improving. Pertinent negatives include no chest pain and no headaches. Exacerbated by: movment/palpation. Nothing relieves the symptoms.  Patient presents after fall.  He reports he was walking tonight stubbed his toe and he fell on his backside.  He also twisted his right foot and ankle.  He reports pain low back, right hip, right foot and ankle.  No head injury.  No LOC.  No neck pain.  No chest/abdominal pain.  He is on anticoagulants for atrial fibrillation  Past Medical History:  Diagnosis Date  . Cancer ([place])   . Colon cancer ([place])   . Coronary artery disease     Patient Active Problem List   Diagnosis Date Noted  . Hyponatremia 08/23/2018  . Obstipation 08/23/2018  . Colon cancer ([place]) 08/23/2018  . CAD (coronary artery disease) 08/23/2018  . DM (diabetes mellitus), type 2 ([place]) 08/23/2018  . Pyuria 08/23/2018    Past Surgical History:  Procedure Laterality Date  . CORONARY ARTERY BYPASS GRAFT          Home Medications    Prior to Admission medications   Medication Sig Start Date End Date Taking? Authorizing Provider  allopurinol (ZYLOPRIM) 300 MG tablet Take 300 mg by mouth daily. 08/12/18   [provider]  amLODipine (NORVASC) 10 MG tablet Take 10 mg by mouth daily. 02/18/18   [provider]  aspirin 81 MG tablet Take 81 mg by mouth daily.    [provider]  cloNIDine (CATAPRES) 0.2 MG tablet Take 0.2 mg by mouth 2 (two) times daily. 02/18/18   [provider]  doxycycline (VIBRA-TABS) 100 MG tablet Take 1 tablet (100 mg total) by mouth every 12 (twelve) hours. 08/25/18   [name], MD  ferrous sulfate 325 (65 FE) MG tablet Take 325 mg by mouth daily. 11/15/15 11/15/19  [provider]  finasteride (PROSCAR) 5 MG tablet Take 5 mg by mouth daily. 02/18/18   [provider]  furosemide (LASIX) 20 MG tablet Take 1 tablet (20 mg total) by mouth daily. 08/29/18   [name], MD  hydrochlorothiazide (HYDRODIURIL) 25 MG tablet Take 25 mg by mouth daily. 02/18/18   [provider]  lisinopril (PRINIVIL,ZESTRIL) 40 MG tablet Take 1 tablet (40 mg total) by mouth daily. 08/29/18   [name], MD  magnesium oxide (MAG-OX) 400 (241.3 Mg) MG tablet Take 1 tablet (400 mg total) by mouth daily. 08/25/18   [name], MD  metFORMIN (GLUCOPHAGE-XR) 500 MG 24 hr tablet Take 500 mg by mouth every morning. 02/18/18   [provider]  Misc. Devices MISC Apply 1 application topically 2 (two) times daily. Triamcinolone 0.1% cream with Aquaphor mixed 3:1, Apply BID prn, Disp 1 lb    [provider]  Multiple Vitamin (MULTIVITAMIN) tablet Take 1 tablet by mouth daily.    [provider]  omeprazole (PRILOSEC) 10 MG capsule Take 10 mg by mouth daily. 02/18/18   [provider]  pravastatin (PRAVACHOL) 10 MG tablet Take 10 mg by mouth at bedtime. 02/18/18   [provider]  propranolol (INDERAL) 40 MG  tablet Take 1 tablet (40 mg total) by mouth 2 (two) times daily. 08/29/18   [name], MD  tamsulosin (FLOMAX) 0.4 MG CAPS capsule Take 0.4 mg by mouth at bedtime. 02/18/18   [provider]    Family History Family History  Problem Relation Age of Onset  . Colon cancer Father   . CAD Other   . Colon cancer Other   . Diabetes Neg Hx     Social History Social History   Tobacco Use  . Smoking status: Former Smoker    Types: Cigarettes  Substance Use Topics  . Alcohol use: Yes  . Drug use: Never     Allergies   Patient has no known allergies.   Review of Systems Review of Systems    Constitutional: Negative for fever.  Cardiovascular: Negative for chest pain.  Musculoskeletal: Positive for arthralgias.  Skin: Positive for wound.  Neurological: Negative for headaches.  All other systems reviewed and are negative.    Physical Exam Updated Vital Signs BP 133/72   Pulse 85   Temp 97.8 F (36.6 C) (Oral)   Resp 17   Ht 1.676 m (5\' 6" )   Wt (!) 140.6 kg   SpO2 96%   BMI 50.04 kg/m   Physical Exam CONSTITUTIONAL: elderly, disheveled HEAD: Normocephalic/atraumatic EYES: EOMI ENMT: Mucous membranes moist NECK: supple no meningeal signs SPINE/BACK:no C/T/L tenderness, sacral tenderness noted CV: S1/S2 noted, no murmurs/rubs/gallops noted LUNGS: Lungs are clear to auscultation bilaterally, no apparent distress ABDOMEN: soft, nontender, no rebound or guarding, bowel sounds noted throughout abdomen GU:no cva tenderness NEURO: Pt is awake/alert/appropriate, moves all extremitiesx4.  No facial droop.   EXTREMITIES: pulses normal/equal, full ROM, pelvis stable, tenderness with ROM of right hip.  Tenderness/swelling to right foot/ankle, no deformities SKIN: warm, color normal, pt with dry skin and evidence of bleeding from skin, no active bleed at this time [facility]: no abnormalities of mood noted, alert and oriented to situation   ED Treatments / Results  Labs (all labs ordered are listed, but only abnormal results are displayed) Labs Reviewed - No data to display  EKG None  Radiology Dg Lumbar Spine Complete  Result Date: 09/29/2018 CLINICAL DATA:  Fall with back pain EXAM: LUMBAR SPINE - COMPLETE 4+ VIEW COMPARISON:  CT abdomen pelvis 08/24/2018 FINDINGS: There is no evidence of lumbar spine fracture. Alignment is normal. There are flowing thoracic syndesmophytes. Calcific aortic atherosclerosis. Mild multilevel vertebral body height loss. IMPRESSION: No acute fracture or static subluxation of the lumbar spine. Electronically Signed   By: Ulyses Jarred M.D.    On: 09/29/2018 06:18   Dg Ankle Complete Right  Result Date: 09/29/2018 CLINICAL DATA:  Mechanical fall with foot pain. EXAM: RIGHT ANKLE - COMPLETE 3+ VIEW COMPARISON:  None. FINDINGS: Joint effusion and soft tissue swelling without acute fracture or malalignment. Periarticular calcifications without erosion, nonspecific. Heel spurs and osteopenia. IMPRESSION: Soft tissue swelling and joint effusion without acute fracture. Electronically Signed   By: Monte Fantasia M.D.   On: 09/29/2018 06:28   Dg Foot Complete Right  Result Date: 09/29/2018 CLINICAL DATA:  Mechanical fall.  Foot pain. EXAM: RIGHT FOOT COMPLETE - 3+ VIEW COMPARISON:  None. FINDINGS: Second proximal phalanx proximal shaft fracture with minimal lateral impaction. Nondisplaced transverse shaft fracture of the first proximal phalanx. Osteopenia.  Hallux valgus and bunion. IMPRESSION: Nondisplaced first and second proximal phalanx fractures. Electronically Signed   By: Monte Fantasia M.D.   On: 09/29/2018 06:30   Dg Hip  Unilat W Or Wo Pelvis 2-3 Views Right  Result Date: 09/29/2018 CLINICAL DATA:  Fall with right hip pain EXAM: DG HIP (WITH OR WITHOUT PELVIS) 2-3V RIGHT COMPARISON:  None. FINDINGS: There is no evidence of hip fracture or dislocation. Mild degenerative change of the right hip. IMPRESSION: No acute osseous abnormality of the right hip. Electronically Signed   By: Ulyses Jarred M.D.   On: 09/29/2018 06:20    Procedures Procedures    Medications Ordered in ED Medications  HYDROcodone-acetaminophen (NORCO/VICODIN) 5-325 MG per tablet 1 tablet (1 tablet Oral Given 09/29/18 0708)     Initial Impression / Assessment and Plan / ED Course  I have reviewed the triage vital signs and the nursing notes.  Pertinent  imaging results that were available during my care of the patient were reviewed by me and considered in my medical decision making (see chart for details).        6:46 AM Pt presents after mechanical  fall Reports he stubbed his toe and fell  No signs of any spinal injury There is no hip injury He does have toe fractures He reports he has had afib for years only takes ASA and no other anticoagulants 7:33 AM Pt stable at this time Pt given postop shoe He is able to ambulate but somewhat limited Patient typically uses a walker.  He may have some difficulty stand with a walker, therefore son will try to obtain a wheelchair.  Patient otherwise says he has easy access at home.  Placed a consult case management to help with home health needs No other acute findings.  Patient able to stand on his own.  He only has pain in right ankle which is likely a sprain as well as steroids.  No hip  pain at this time All Questions answered.  Patient feels comfortable going home Final Clinical Impressions(s) / ED Diagnoses   Final diagnoses:  Fall, initial encounter  Closed nondisplaced fracture of proximal phalanx of right great toe, initial encounter  Closed nondisplaced fracture of proximal phalanx of lesser toe of right foot, initial encounter    ED Discharge Orders         Ordered    HYDROcodone-acetaminophen (NORCO/VICODIN) 5-325 MG tablet  Every 6 hours PRN     09/29/18 0727           Ripley Fraise, MD 09/29/18 [phone]

## 2018-09-29 NOTE — ED Notes (Signed)
Patient verbalizes understanding of discharge instructions. Opportunity for questioning and answers were provided. Armband removed by staff, pt discharged from ED home via POV with family. 

## 2018-09-29 NOTE — ED Notes (Signed)
Patient transported to MRI 

## 2018-09-29 NOTE — ED Triage Notes (Signed)
Pt arrived via [facility]; pt from hm with c/o mechanical fall backwards in kitchen, falling on sacrum. Pt states that he is on blood thinners and has Afib; denies LOC; denies hitting head; pt has chronic BLE ext edema; 130/78, 78, 95% on RA

## 2018-09-30 ENCOUNTER — Inpatient Hospital Stay (HOSPITAL_COMMUNITY): Payer: Medicare Other

## 2018-09-30 ENCOUNTER — Encounter (HOSPITAL_COMMUNITY): Payer: Self-pay | Admitting: Orthopedic Surgery

## 2018-09-30 ENCOUNTER — Inpatient Hospital Stay (HOSPITAL_COMMUNITY): Payer: Medicare Other | Admitting: Anesthesiology

## 2018-09-30 ENCOUNTER — Encounter (HOSPITAL_COMMUNITY)
Admission: EM | Disposition: A | Discharge status: Skilled Nursing Facility | Payer: Self-pay | Source: Home / Self Care | Attending: Family Medicine

## 2018-09-30 DIAGNOSIS — R262 Difficulty in walking, not elsewhere classified: Secondary | ICD-10-CM

## 2018-09-30 DIAGNOSIS — E669 Obesity, unspecified: Secondary | ICD-10-CM

## 2018-09-30 DIAGNOSIS — W19XXXA Unspecified fall, initial encounter: Secondary | ICD-10-CM

## 2018-09-30 DIAGNOSIS — S86891A Other injury of other muscle(s) and tendon(s) at lower leg level, right leg, initial encounter: Secondary | ICD-10-CM

## 2018-09-30 HISTORY — PX: PATELLAR TENDON REPAIR: SHX737

## 2018-09-30 HISTORY — PX: APPLICATION OF WOUND VAC: SHX5189

## 2018-09-30 LAB — GLUCOSE, CAPILLARY
Glucose-Capillary: 114 mg/dL — ABNORMAL HIGH (ref 70–99)
Glucose-Capillary: 112 mg/dL — ABNORMAL HIGH (ref 70–99)
Glucose-Capillary: 185 mg/dL — ABNORMAL HIGH (ref 70–99)
Glucose-Capillary: 149 mg/dL — ABNORMAL HIGH (ref 70–99)

## 2018-09-30 LAB — CBC
HCT: 34.8 % — ABNORMAL LOW (ref 39.0–52.0)
Hemoglobin: 10.7 g/dL — ABNORMAL LOW (ref 13.0–17.0)
MCH: 27.9 pg (ref 26.0–34.0)
MCHC: 30.7 g/dL (ref 30.0–36.0)
MCV: 90.9 fL (ref 80.0–100.0)
Platelets: 158 10*3/uL (ref 150–400)
RBC: 3.83 MIL/uL — ABNORMAL LOW (ref 4.22–5.81)
RDW: 18.3 % — ABNORMAL HIGH (ref 11.5–15.5)
WBC: 6.9 10*3/uL (ref 4.0–10.5)
nRBC: 0 % (ref 0.0–0.2)

## 2018-09-30 LAB — BASIC METABOLIC PANEL
Anion gap: 7 (ref 5–15)
BUN: 10 mg/dL (ref 8–23)
CO2: 25 mmol/L (ref 22–32)
Calcium: 9.1 mg/dL (ref 8.9–10.3)
Chloride: 100 mmol/L (ref 98–111)
Creatinine, Ser: 0.94 mg/dL (ref 0.61–1.24)
GFR calc Af Amer: >60 mL/min (ref >60)
GFR calc non Af Amer: >60 mL/min (ref >60)
Glucose, Bld: 120 mg/dL — ABNORMAL HIGH (ref 70–99)
Potassium: 4.5 mmol/L (ref 3.5–5.1)
Sodium: 132 mmol/L — ABNORMAL LOW (ref 135–145)

## 2018-09-30 LAB — SURGICAL PCR SCREEN
MRSA, PCR: NEGATIVE
Staphylococcus aureus: POSITIVE — AB

## 2018-09-30 IMAGING — DX DG KNEE 1-2V PORT*R*
2 series · 2 of 2 positions shown · non-contrast
Comparison: [DATE], [DATE], CT [DATE]

CLINICAL DATA: Postop right patellar tendon repair

EXAM:
PORTABLE RIGHT KNEE - 1-2 VIEW

[knee ap]
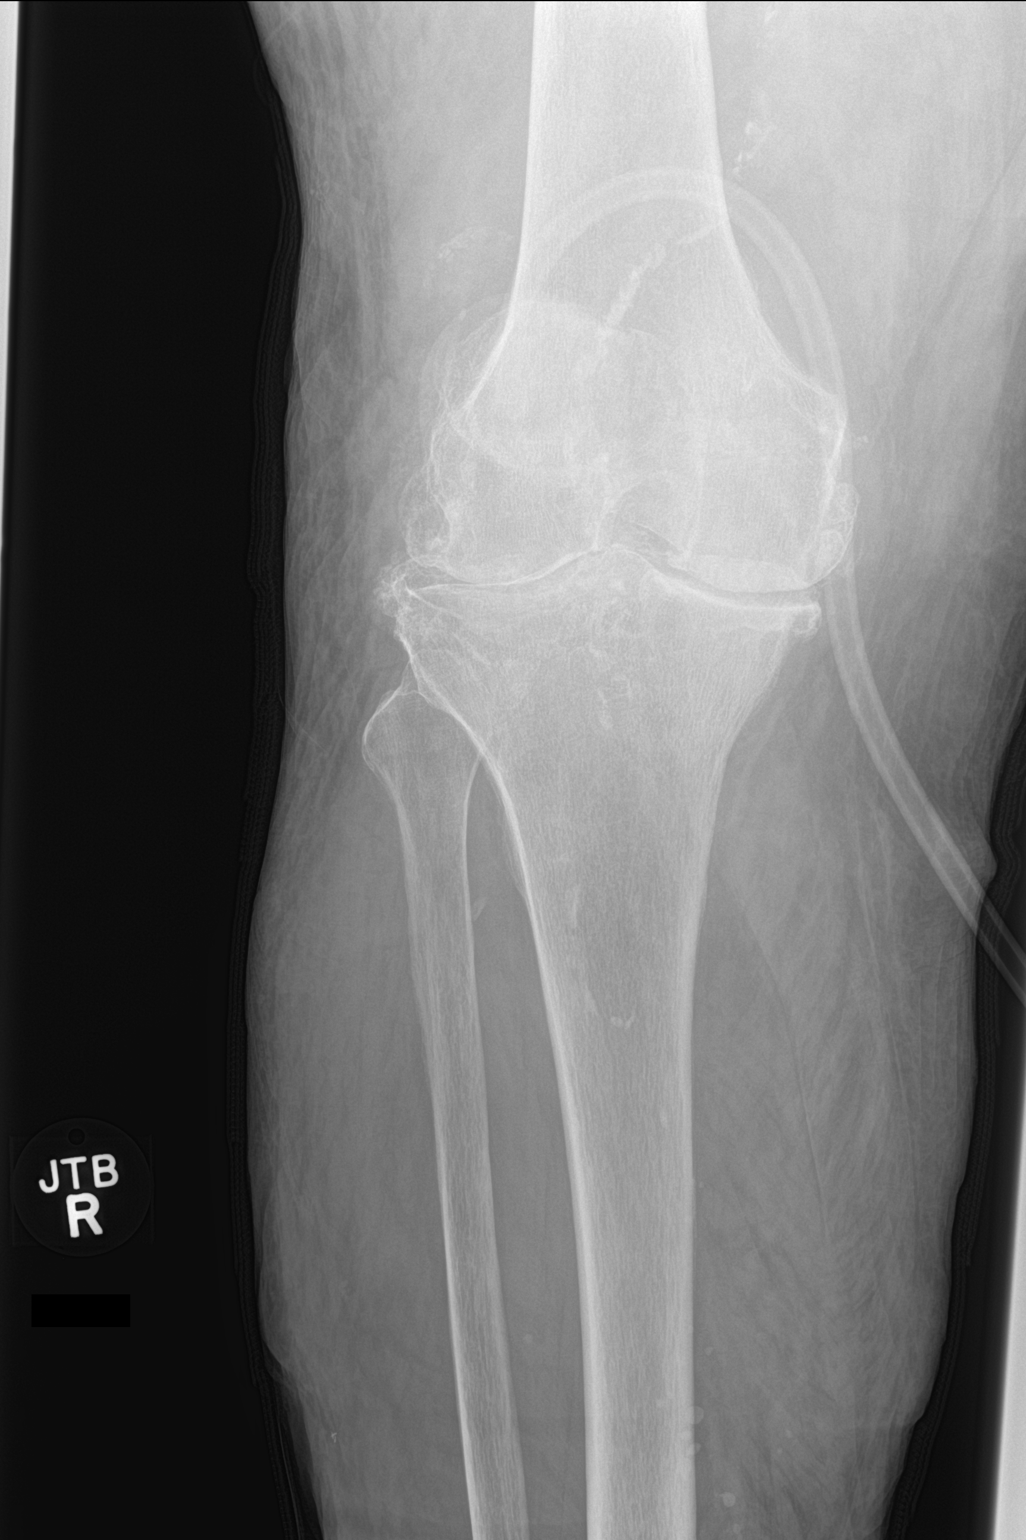

[knee lat]
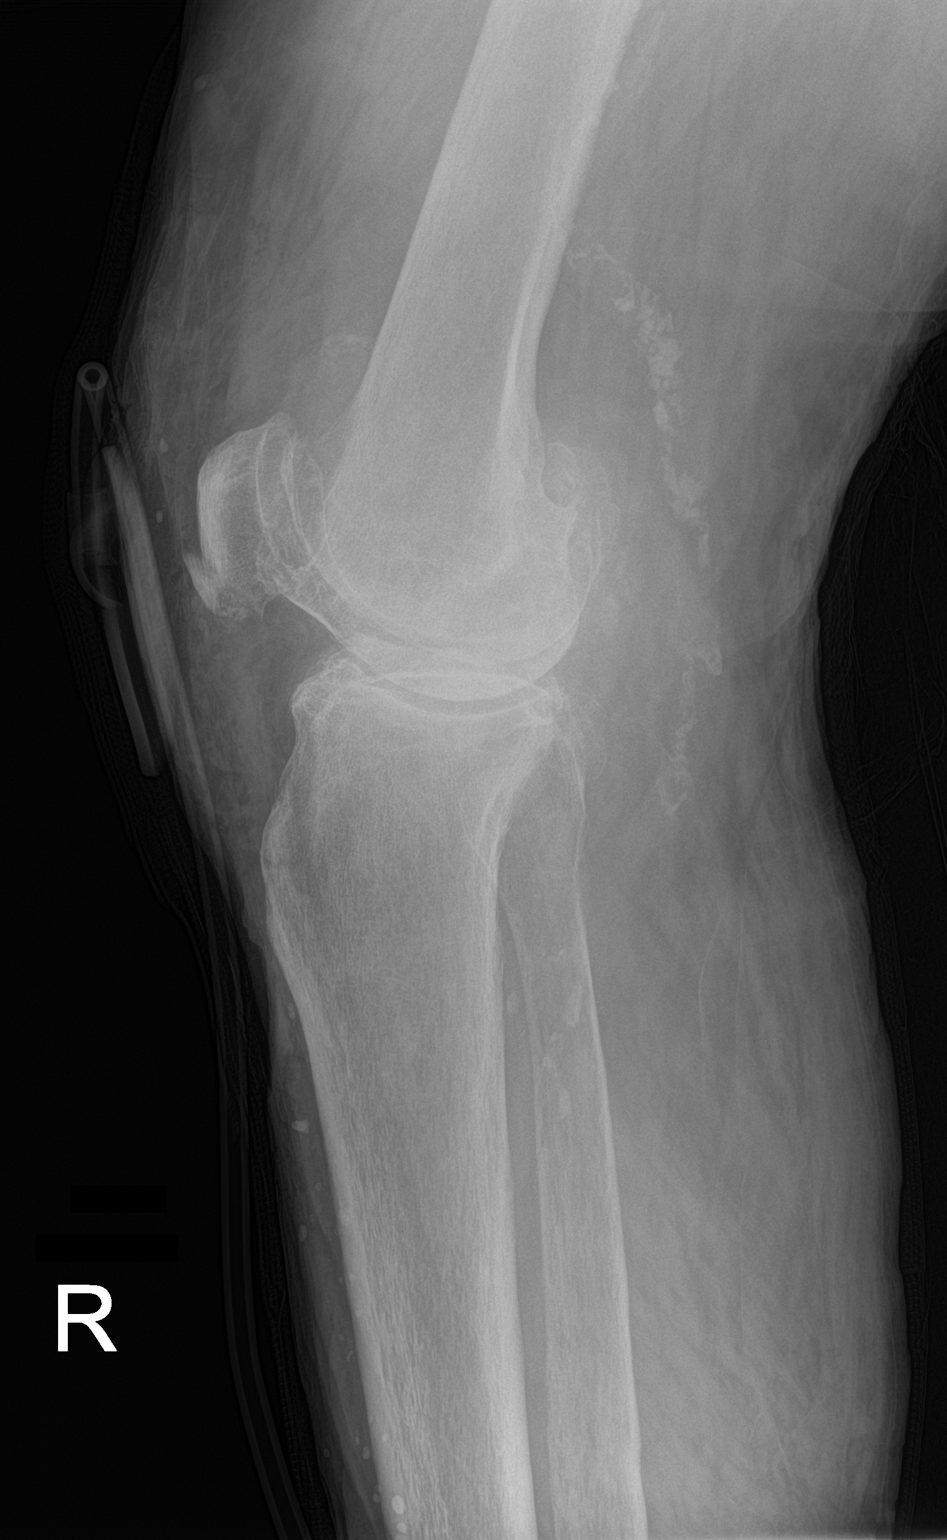

[2 of 2 positions shown; findings below may reference images not displayed]

FINDINGS: Diffuse soft tissue swelling. Inferior patellar pole fracture with
decreased separation of avulse fragment from parent bone. Soft
tissue thickening and edema at the level of the patellar tendon.
Knee effusion. Moderate arthritis of the patellofemoral compartment
and advanced arthritis of the medial and lateral joint spaces.
Vascular calcification.
IMPRESSION: 1. Edema and soft tissue thickening at the infrapatellar soft
tissues with decreased separation of lower pole patella fracture
fragment from parent bone
2. Knee effusion.
3. Advanced arthritis of the knee

## 2018-09-30 IMAGING — CT CT KNEE*R* W/O CM
3 series · 15 of 33 positions shown, 18 images · non-contrast
Comparison: Right knee radiographs [DATE]

CLINICAL DATA: Fell yesterday. Unable to bear weight. Possible
occult fracture.

EXAM:
CT OF THE right KNEE WITHOUT CONTRAST
TECHNIQUE: Multidetector CT imaging of the right knee was performed according
to the standard protocol. Multiplanar CT image reconstructions were
also generated.

[Series 4: lfov ext 3.0 b40s · axial · 0.45mm/px · z∈[-322,-120]mm · 7 of 81 slices shown, 9 images]
[im 7/81  soft-tissue]
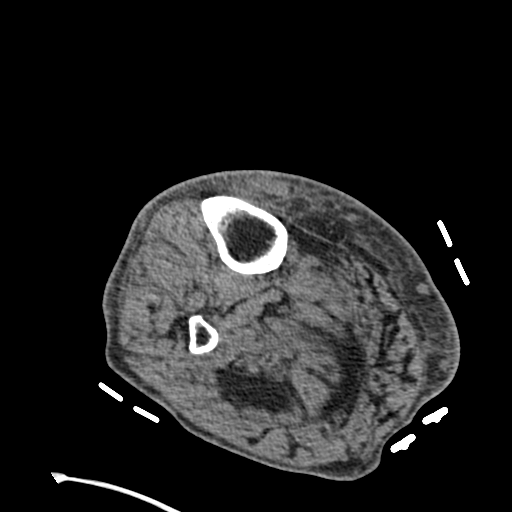
[im 7/81  bone]
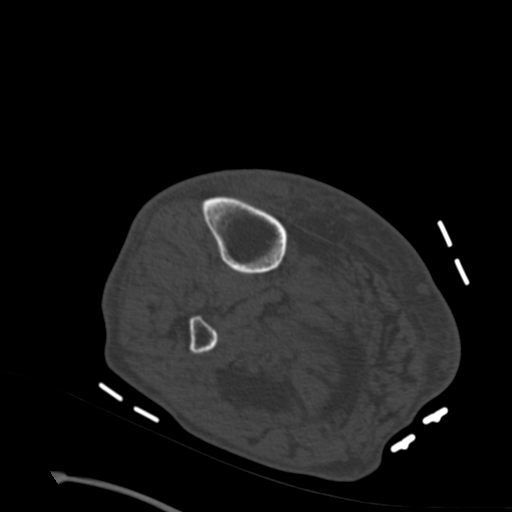
[im 19/81  bone]
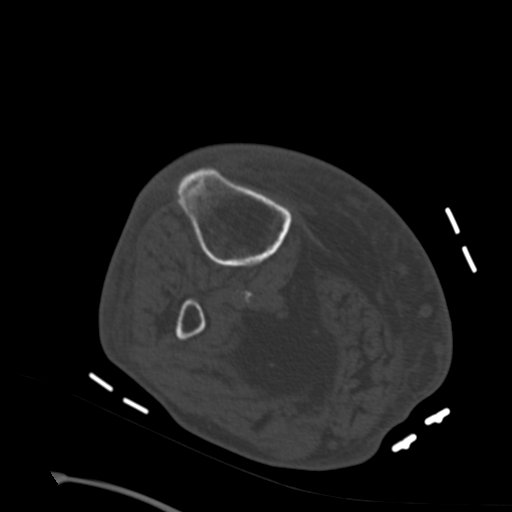
[im 31/81  bone]
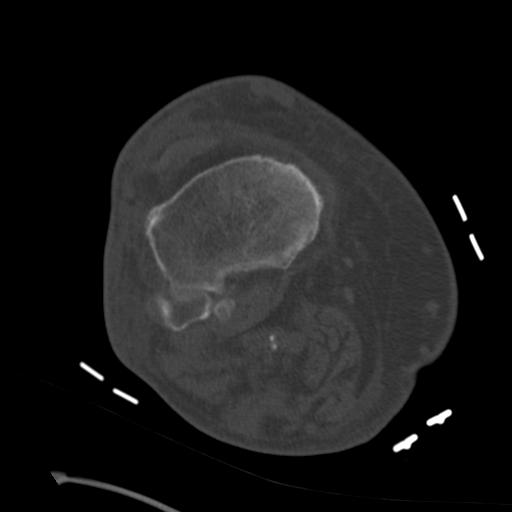
[im 44/81  bone]
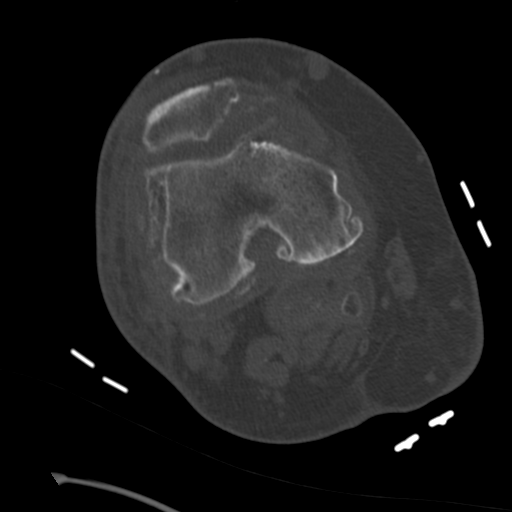
[im 50/81  soft-tissue]
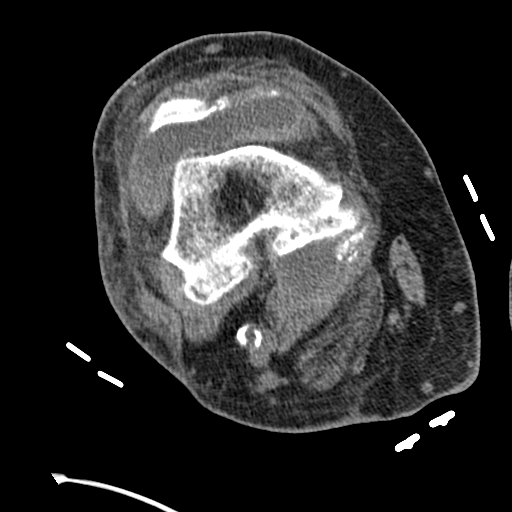
[im 50/81  bone]
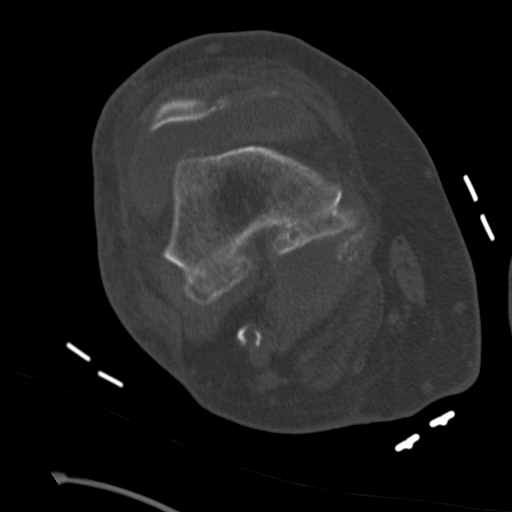
[im 62/81  bone]
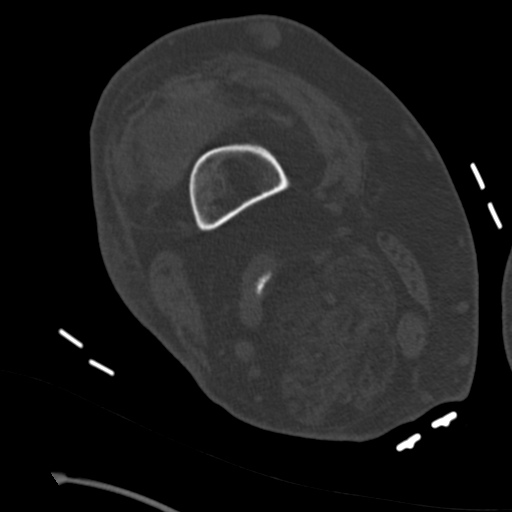
[im 74/81  bone]
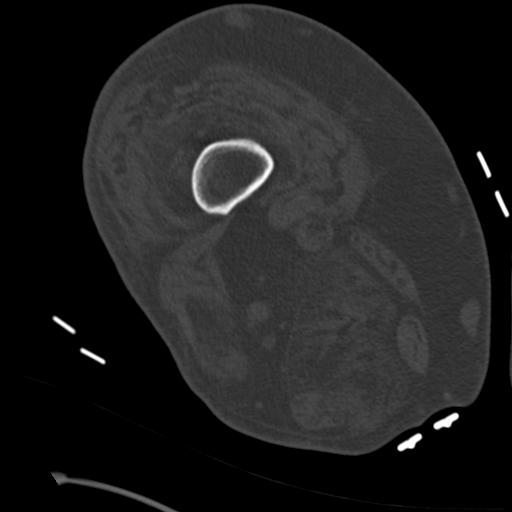

[Series 7: coronalsoft tissue · coronal · 0.40mm/px · 3 of 110 slices shown]
[im 22/110  bone]
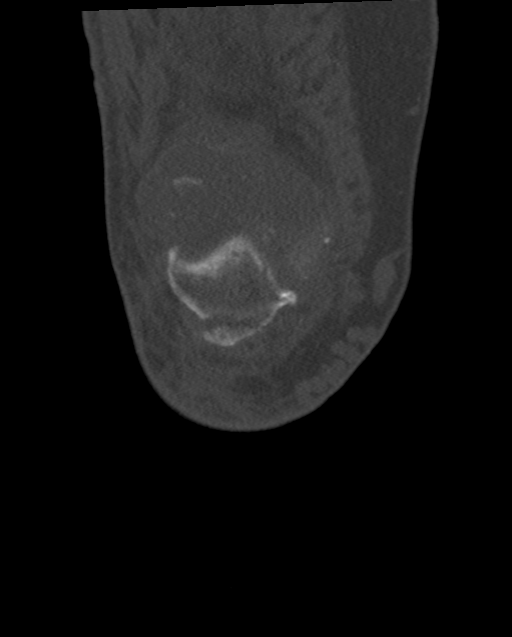
[im 44/110  bone]
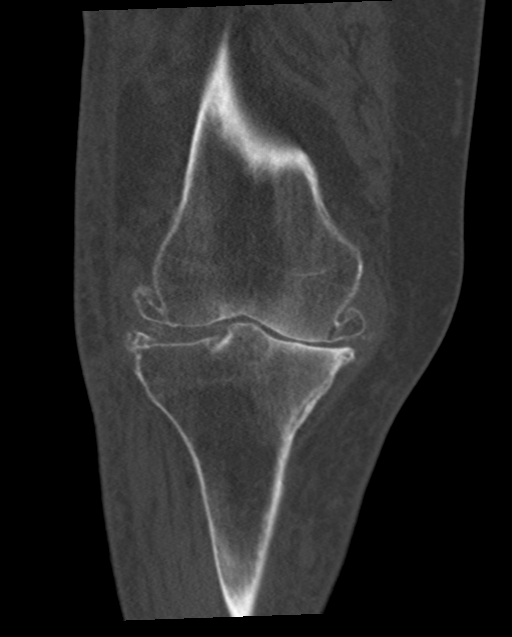
[im 66/110  bone]
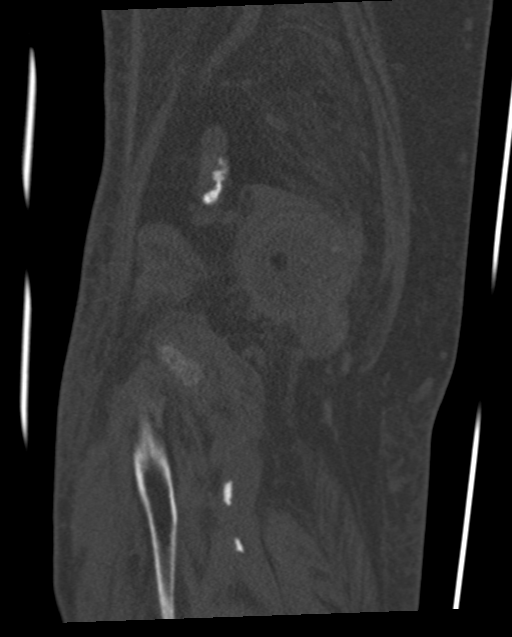

[Series 8: sagittalsoft tissue · sagittal · 0.41mm/px · 5 of 105 slices shown, 6 images]
[im 35/105  bone]
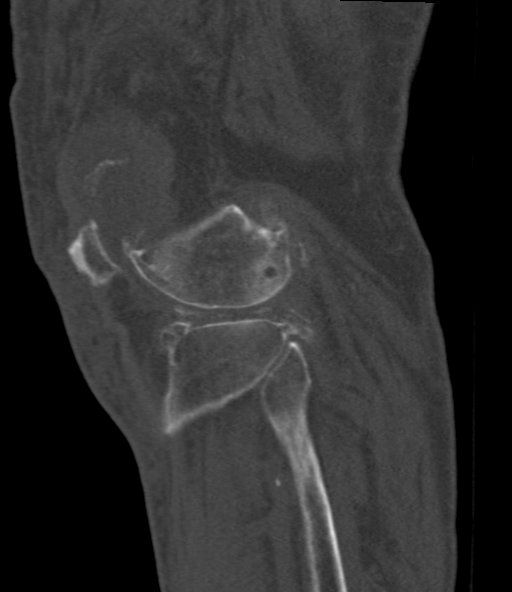
[im 44/105  bone]
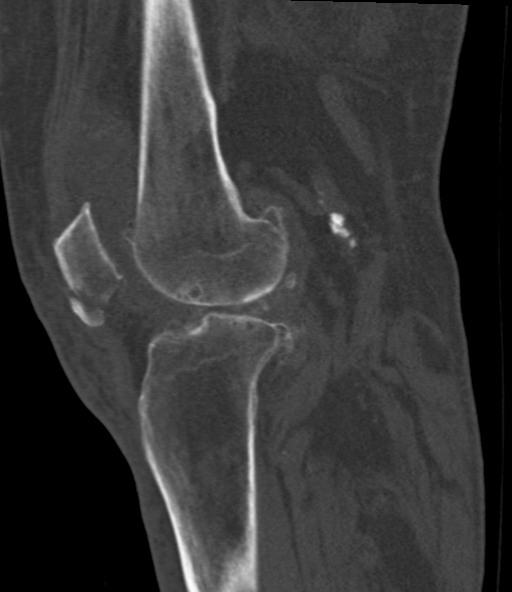
[im 53/105  soft-tissue]
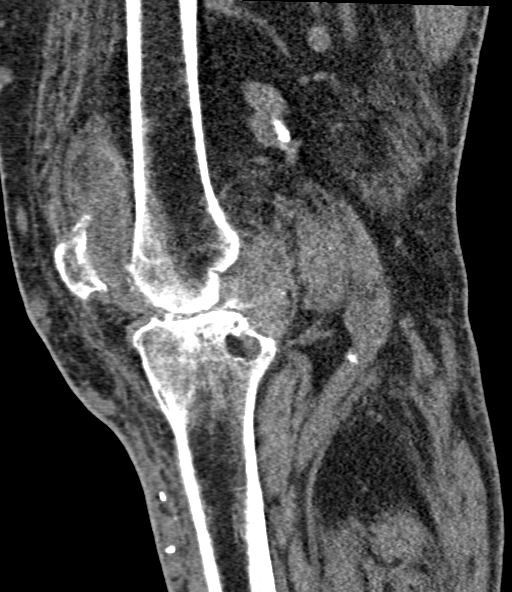
[im 53/105  bone]
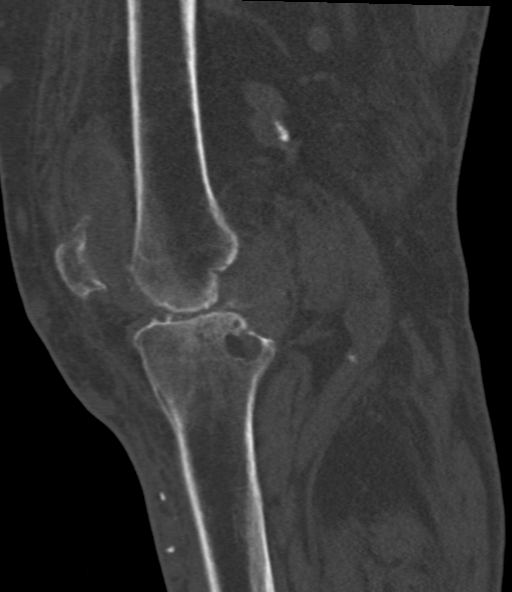
[im 61/105  bone]
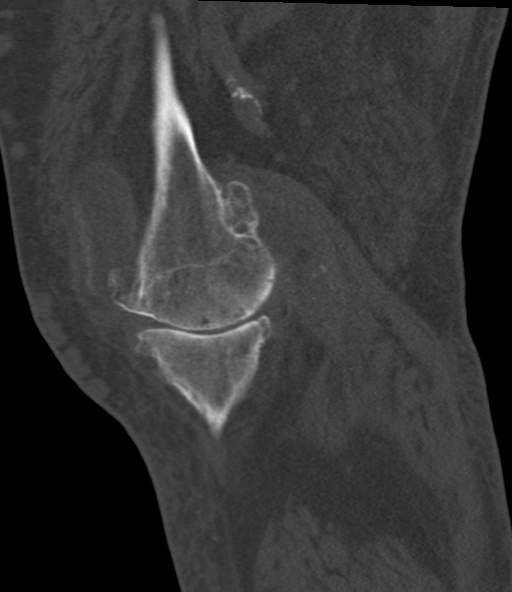
[im 70/105  bone]
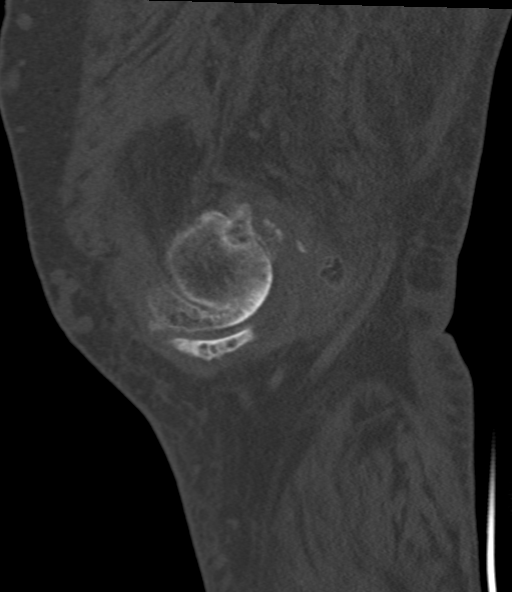

[15 of 33 positions shown; findings below may reference images not displayed]

FINDINGS: As demonstrated on the radiographs there is an acute avulsion type
fracture involving the distal pole of the patella with associated
wavy redundant appearance of the patellar tendon..

No acute fracture of the tibia or femur is identified. The fibula is
also intact.

Advanced tricompartmental degenerative changes with joint space
narrowing, osteophytic spurring, bony eburnation and subchondral
cystic change. There is also chondrocalcinosis, a large joint
effusion and osteophyte loose bodies in the joint. Findings
suspicious for CPPD arthropathy.

Moderate vascular calcifications are also noted.
IMPRESSION: 1. Acute oblique coursing avulsion fracture involving the distal
pole of the patellar with associated wavy redundant appearance of
the patellar tendon which has lost its tension.
2. No other fractures are identified.
3. Advanced tricompartmental degenerative changes as detailed above.
Suspect CPPD arthropathy.
4. Large joint effusion.

## 2018-09-30 IMAGING — RF DG KNEE 1-2V*R*
1 series · 2 of 2 positions shown · non-contrast
Comparison: Right knee CT obtained earlier today.

CLINICAL DATA: Right knee patellar tendon repair.

EXAM:
DG C-ARM 61-120 MIN; RIGHT KNEE - 1-2 VIEW

[Series 1: run · 2 of 2 slices shown]
[im 1/2]
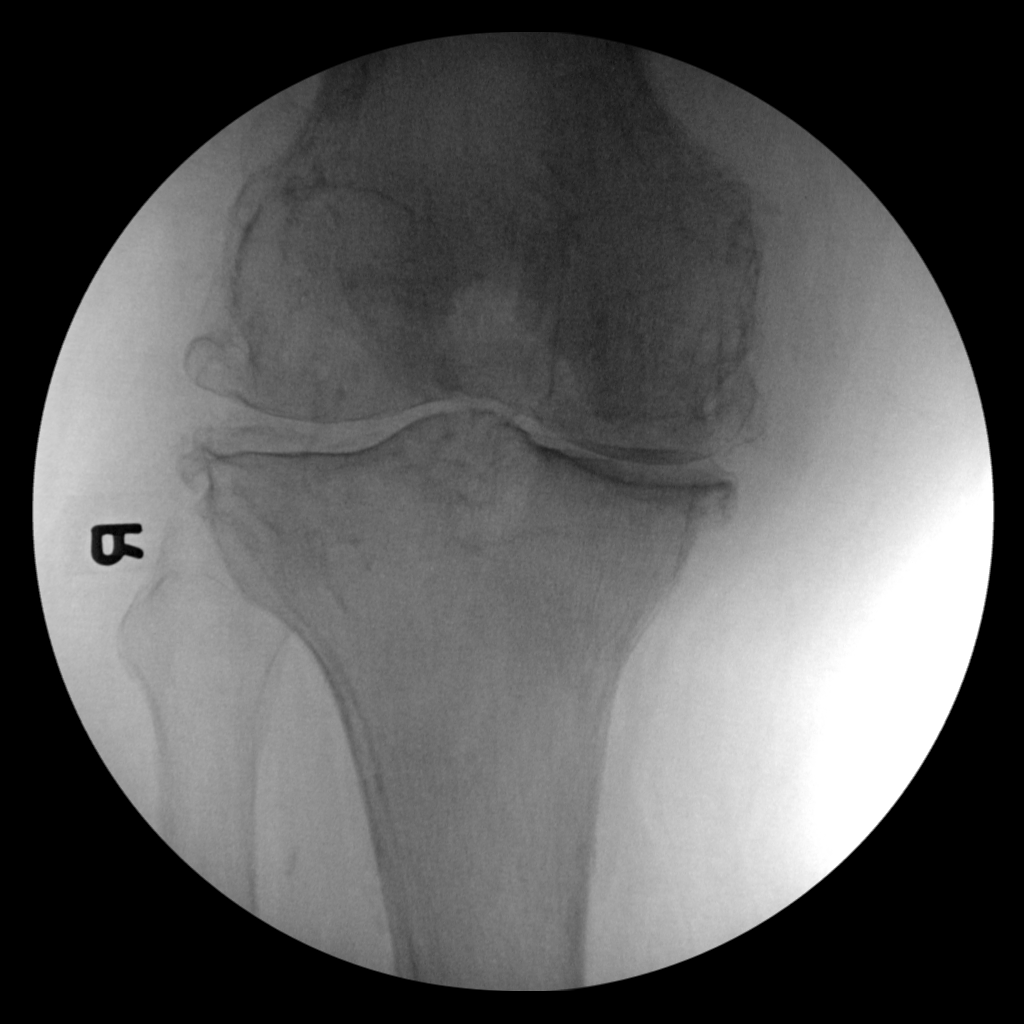
[im 2/2]
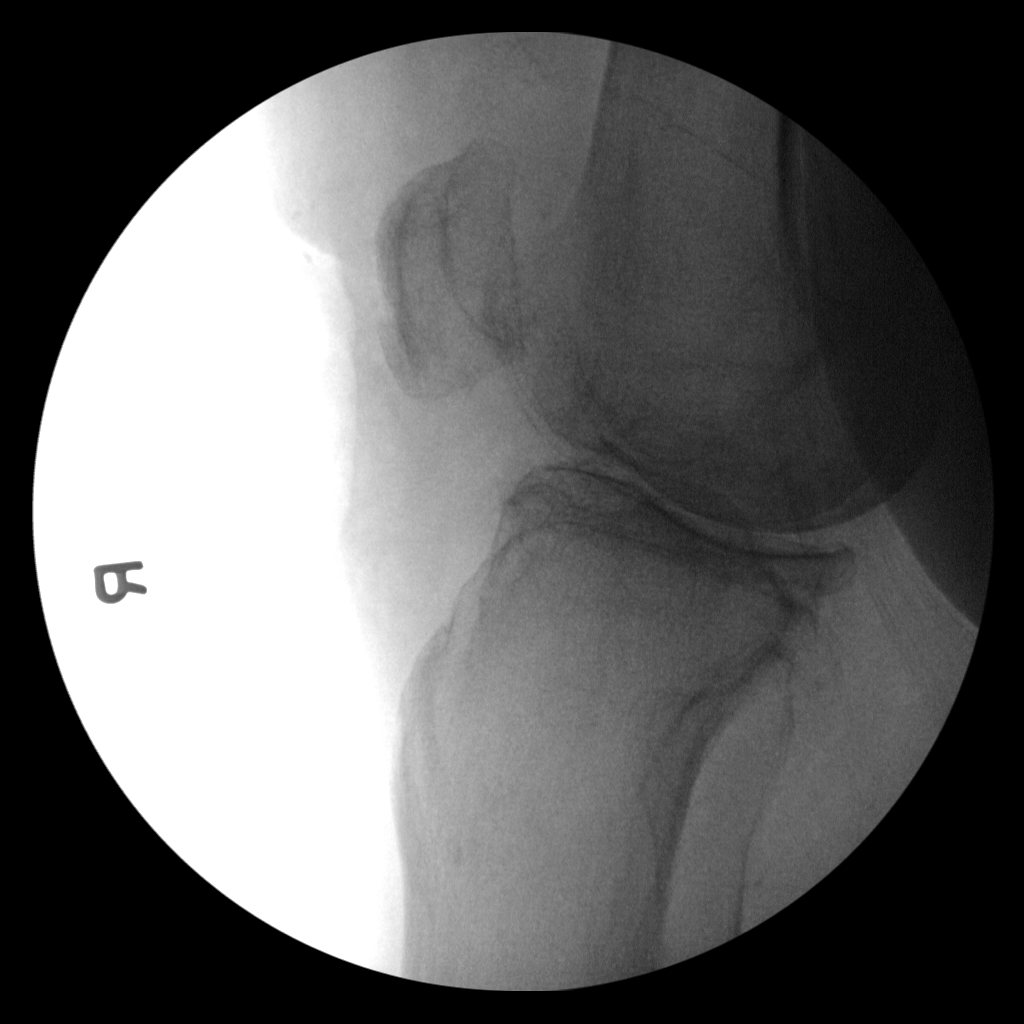

[2 of 2 positions shown; findings below may reference images not displayed]

FINDINGS: AP and lateral C-arm views of the right knee demonstrate previously
described tricompartmental degenerative changes. The distracted
inferior patellar fracture has been reduced on the lateral view with
mild residual anterior displacement.
IMPRESSION: 1. Status post reduction of the inferior patellar fracture with mild
residual anterior displacement.
2. Tricompartmental degenerative changes.

## 2018-09-30 SURGERY — REPAIR, TENDON, PATELLAR
Anesthesia: General | Site: Knee | Laterality: Right

## 2018-09-30 MED ORDER — ONDANSETRON HCL 4 MG/2ML IJ SOLN
INTRAMUSCULAR | Status: DC | PRN
  Administered 2018-09-30: 4 mg via INTRAVENOUS

## 2018-09-30 MED ORDER — CEFAZOLIN SODIUM-DEXTROSE 2-4 GM/100ML-% IV SOLN
INTRAVENOUS | Status: AC
  Filled 2018-09-30: qty 100

## 2018-09-30 MED ORDER — METOCLOPRAMIDE HCL 5 MG PO TABS: 5.0000 mg | ORAL_TABLET | Freq: Three times a day (TID) | ORAL | Status: DC | PRN

## 2018-09-30 MED ORDER — 0.9 % SODIUM CHLORIDE (POUR BTL) OPTIME
TOPICAL | Status: DC | PRN
  Administered 2018-09-30: 1000 mL

## 2018-09-30 MED ORDER — PHENYLEPHRINE 40 MCG/ML (10ML) SYRINGE FOR IV PUSH (FOR BLOOD PRESSURE SUPPORT)
PREFILLED_SYRINGE | INTRAVENOUS | Status: DC | PRN
  Administered 2018-09-30: 120 ug via INTRAVENOUS
  Administered 2018-09-30: 180 ug via INTRAVENOUS

## 2018-09-30 MED ORDER — MUPIROCIN 2 % EX OINT: 1.0000 "application " | TOPICAL_OINTMENT | Freq: Two times a day (BID) | CUTANEOUS | Status: DC

## 2018-09-30 MED ORDER — LIDOCAINE 2% (20 MG/ML) 5 ML SYRINGE
INTRAMUSCULAR | Status: AC
  Filled 2018-09-30: qty 5

## 2018-09-30 MED ORDER — DEXAMETHASONE SODIUM PHOSPHATE 10 MG/ML IJ SOLN
INTRAMUSCULAR | Status: DC | PRN
  Administered 2018-09-30: 4 mg via INTRAVENOUS

## 2018-09-30 MED ORDER — FENTANYL CITRATE (PF) 250 MCG/5ML IJ SOLN
INTRAMUSCULAR | Status: DC | PRN
  Administered 2018-09-30: 50 ug via INTRAVENOUS
  Administered 2018-09-30: 100 ug via INTRAVENOUS

## 2018-09-30 MED ORDER — PROPOFOL 10 MG/ML IV BOLUS
INTRAVENOUS | Status: DC | PRN
  Administered 2018-09-30: 150 mg via INTRAVENOUS

## 2018-09-30 MED ORDER — HYDROMORPHONE HCL 1 MG/ML IJ SOLN
INTRAMUSCULAR | Status: AC
  Filled 2018-09-30: qty 1

## 2018-09-30 MED ORDER — ALBUMIN HUMAN 5 % IV SOLN
INTRAVENOUS | Status: DC | PRN
  Administered 2018-09-30: 12:00:00 via INTRAVENOUS

## 2018-09-30 MED ORDER — FENTANYL CITRATE (PF) 250 MCG/5ML IJ SOLN
INTRAMUSCULAR | Status: AC
  Filled 2018-09-30: qty 5

## 2018-09-30 MED ORDER — VITAMIN C 500 MG PO TABS
1000.0000 mg | ORAL_TABLET | Freq: Every day | ORAL | Status: DC
  Administered 2018-09-30 – 2018-10-08 (×9): 1000 mg via ORAL
  Filled 2018-09-30 (×9): qty 2

## 2018-09-30 MED ORDER — ONDANSETRON HCL 4 MG PO TABS: 4.0000 mg | ORAL_TABLET | Freq: Four times a day (QID) | ORAL | Status: DC | PRN

## 2018-09-30 MED ORDER — HYDROCODONE-ACETAMINOPHEN 7.5-325 MG PO TABS
1.0000 | ORAL_TABLET | Freq: Four times a day (QID) | ORAL | Status: DC | PRN
  Administered 2018-09-30: 1 via ORAL
  Filled 2018-09-30: qty 1

## 2018-09-30 MED ORDER — MIDAZOLAM HCL 2 MG/2ML IJ SOLN
INTRAMUSCULAR | Status: DC | PRN
  Administered 2018-09-30: 2 mg via INTRAVENOUS

## 2018-09-30 MED ORDER — SODIUM CHLORIDE 0.9 % IV SOLN
INTRAVENOUS | Status: DC | PRN
  Administered 2018-09-30: 45 ug/min via INTRAVENOUS

## 2018-09-30 MED ORDER — MUPIROCIN 2 % EX OINT
1.0000 "application " | TOPICAL_OINTMENT | Freq: Two times a day (BID) | CUTANEOUS | Status: AC
  Administered 2018-09-30 – 2018-10-04 (×10): 1 via NASAL
  Filled 2018-09-30 (×4): qty 22

## 2018-09-30 MED ORDER — ACETAMINOPHEN 325 MG PO TABS
650.0000 mg | ORAL_TABLET | Freq: Four times a day (QID) | ORAL | Status: DC
  Administered 2018-09-30 – 2018-10-08 (×29): 650 mg via ORAL
  Filled 2018-09-30 (×28): qty 2

## 2018-09-30 MED ORDER — HYDROMORPHONE HCL 1 MG/ML IJ SOLN
0.2500 mg | INTRAMUSCULAR | Status: DC | PRN
  Administered 2018-09-30 (×2): 0.5 mg via INTRAVENOUS

## 2018-09-30 MED ORDER — SUCCINYLCHOLINE CHLORIDE 200 MG/10ML IV SOSY
PREFILLED_SYRINGE | INTRAVENOUS | Status: DC | PRN
  Administered 2018-09-30: 140 mg via INTRAVENOUS

## 2018-09-30 MED ORDER — ONDANSETRON HCL 4 MG/2ML IJ SOLN
INTRAMUSCULAR | Status: AC
  Filled 2018-09-30: qty 2

## 2018-09-30 MED ORDER — PROPOFOL 10 MG/ML IV BOLUS
INTRAVENOUS | Status: AC
  Filled 2018-09-30: qty 20

## 2018-09-30 MED ORDER — DEXTROSE 5 % IV SOLN
INTRAVENOUS | Status: DC | PRN
  Administered 2018-09-30: 3 g via INTRAVENOUS

## 2018-09-30 MED ORDER — DEXTROSE 5 % IV SOLN
3.0000 g | Freq: Once | INTRAVENOUS | Status: DC
  Filled 2018-09-30: qty 3000

## 2018-09-30 MED ORDER — MIDAZOLAM HCL 2 MG/2ML IJ SOLN
INTRAMUSCULAR | Status: AC
  Filled 2018-09-30: qty 2

## 2018-09-30 MED ORDER — OXYCODONE HCL 5 MG PO TABS
5.0000 mg | ORAL_TABLET | Freq: Four times a day (QID) | ORAL | Status: DC | PRN
  Administered 2018-09-30 – 2018-10-02 (×5): 10 mg via ORAL
  Administered 2018-10-02 (×2): 5 mg via ORAL
  Administered 2018-10-03 – 2018-10-04 (×5): 10 mg via ORAL
  Administered 2018-10-05: 5 mg via ORAL
  Administered 2018-10-05 (×3): 10 mg via ORAL
  Administered 2018-10-06: 5 mg via ORAL
  Administered 2018-10-06 (×2): 10 mg via ORAL
  Administered 2018-10-07: 5 mg via ORAL
  Administered 2018-10-07 – 2018-10-08 (×3): 10 mg via ORAL
  Filled 2018-09-30 (×8): qty 2
  Filled 2018-09-30: qty 1
  Filled 2018-09-30: qty 2
  Filled 2018-09-30: qty 1
  Filled 2018-09-30 (×4): qty 2
  Filled 2018-09-30: qty 1
  Filled 2018-09-30 (×2): qty 2
  Filled 2018-09-30: qty 1
  Filled 2018-09-30 (×5): qty 2

## 2018-09-30 MED ORDER — CHLORHEXIDINE GLUCONATE CLOTH 2 % EX PADS
6.0000 | MEDICATED_PAD | Freq: Every day | CUTANEOUS | Status: AC
  Administered 2018-10-01 – 2018-10-04 (×3): 6 via TOPICAL

## 2018-09-30 MED ORDER — MORPHINE SULFATE (PF) 2 MG/ML IV SOLN: 2.0000 mg | INTRAVENOUS | Status: DC | PRN

## 2018-09-30 MED ORDER — LACTATED RINGERS IV SOLN
INTRAVENOUS | Status: DC
  Administered 2018-09-30 (×2): via INTRAVENOUS

## 2018-09-30 MED ORDER — ONDANSETRON HCL 4 MG/2ML IJ SOLN: 4.0000 mg | Freq: Four times a day (QID) | INTRAMUSCULAR | Status: DC | PRN

## 2018-09-30 MED ORDER — ROCURONIUM BROMIDE 10 MG/ML (PF) SYRINGE
PREFILLED_SYRINGE | INTRAVENOUS | Status: DC | PRN
  Administered 2018-09-30: 50 mg via INTRAVENOUS

## 2018-09-30 MED ORDER — PROMETHAZINE HCL 25 MG/ML IJ SOLN: 6.2500 mg | INTRAMUSCULAR | Status: DC | PRN

## 2018-09-30 MED ORDER — LIDOCAINE 2% (20 MG/ML) 5 ML SYRINGE
INTRAMUSCULAR | Status: DC | PRN
  Administered 2018-09-30: 100 mg via INTRAVENOUS

## 2018-09-30 MED ORDER — DEXAMETHASONE SODIUM PHOSPHATE 10 MG/ML IJ SOLN
INTRAMUSCULAR | Status: AC
  Filled 2018-09-30: qty 1

## 2018-09-30 MED ORDER — CHLORHEXIDINE GLUCONATE CLOTH 2 % EX PADS
6.0000 | MEDICATED_PAD | Freq: Once | CUTANEOUS | Status: AC
  Administered 2018-09-30: 6 via TOPICAL

## 2018-09-30 MED ORDER — DOCUSATE SODIUM 100 MG PO CAPS
100.0000 mg | ORAL_CAPSULE | Freq: Two times a day (BID) | ORAL | Status: DC
  Administered 2018-09-30 – 2018-10-05 (×9): 100 mg via ORAL
  Filled 2018-09-30 (×12): qty 1

## 2018-09-30 MED ORDER — METOCLOPRAMIDE HCL 5 MG/ML IJ SOLN: 5.0000 mg | Freq: Three times a day (TID) | INTRAMUSCULAR | Status: DC | PRN

## 2018-09-30 MED ORDER — CEFAZOLIN SODIUM-DEXTROSE 2-4 GM/100ML-% IV SOLN
2.0000 g | Freq: Four times a day (QID) | INTRAVENOUS | Status: AC
  Administered 2018-09-30 – 2018-10-01 (×2): 2 g via INTRAVENOUS
  Filled 2018-09-30 (×3): qty 100

## 2018-09-30 MED ORDER — SUGAMMADEX SODIUM 200 MG/2ML IV SOLN
INTRAVENOUS | Status: DC | PRN
  Administered 2018-09-30: 300 mg via INTRAVENOUS

## 2018-09-30 SURGICAL SUPPLY — 78 items
BANDAGE ACE 4X5 VEL STRL LF (GAUZE/BANDAGES/DRESSINGS) ×2 IMPLANT
BANDAGE ACE 6X5 VEL STRL LF (GAUZE/BANDAGES/DRESSINGS) ×2 IMPLANT
BLADE CLIPPER SURG (BLADE) ×2 IMPLANT
BLADE SURG 10 STRL SS (BLADE) IMPLANT
BNDG ELASTIC 6X10 VLCR STRL LF (GAUZE/BANDAGES/DRESSINGS) ×4 IMPLANT
BNDG GAUZE ELAST 4 BULKY (GAUZE/BANDAGES/DRESSINGS) ×4 IMPLANT
BRUSH SCRUB SURG 4.25 DISP (MISCELLANEOUS) ×4 IMPLANT
CHLORAPREP W/TINT 26ML (MISCELLANEOUS) ×2 IMPLANT
CONT SPEC 4OZ CLIKSEAL STRL BL (MISCELLANEOUS) ×2 IMPLANT
COVER SURGICAL LIGHT HANDLE (MISCELLANEOUS) ×4 IMPLANT
COVER WAND RF STERILE (DRAPES) ×2 IMPLANT
CUFF TOURNIQUET SINGLE 34IN LL (TOURNIQUET CUFF) ×2 IMPLANT
CUFF TOURNIQUET SINGLE 44IN (TOURNIQUET CUFF) IMPLANT
DECANTER SPIKE VIAL GLASS SM (MISCELLANEOUS) IMPLANT
DRAPE C-ARM 42X72 X-RAY (DRAPES) ×2 IMPLANT
DRAPE C-ARMOR (DRAPES) ×2 IMPLANT
DRAPE ORTHO SPLIT 77X108 STRL (DRAPES) ×3
DRAPE SURG ORHT 6 SPLT 77X108 (DRAPES) ×3 IMPLANT
DRESSING PEEL AND PLC PRVNA 13 (GAUZE/BANDAGES/DRESSINGS) ×1 IMPLANT
DRSG ADAPTIC 3X8 NADH LF (GAUZE/BANDAGES/DRESSINGS) ×2 IMPLANT
DRSG EMULSION OIL 3X3 NADH (GAUZE/BANDAGES/DRESSINGS) ×2 IMPLANT
DRSG PAD ABDOMINAL 8X10 ST (GAUZE/BANDAGES/DRESSINGS) ×2 IMPLANT
DRSG PEEL AND PLACE PREVENA 13 (GAUZE/BANDAGES/DRESSINGS) ×2
ELECT REM PT RETURN 9FT ADLT (ELECTROSURGICAL) ×2
ELECTRODE REM PT RTRN 9FT ADLT (ELECTROSURGICAL) ×1 IMPLANT
GLOVE BIO SURGEON STRL SZ 6.5 (GLOVE) ×6 IMPLANT
GLOVE BIO SURGEON STRL SZ7.5 (GLOVE) ×8 IMPLANT
GLOVE BIOGEL PI IND STRL 6.5 (GLOVE) ×1 IMPLANT
GLOVE BIOGEL PI IND STRL 7.5 (GLOVE) ×1 IMPLANT
GLOVE BIOGEL PI INDICATOR 6.5 (GLOVE) ×1
GLOVE BIOGEL PI INDICATOR 7.5 (GLOVE) ×1
GOWN STRL REUS W/ TWL LRG LVL3 (GOWN DISPOSABLE) ×2 IMPLANT
GOWN STRL REUS W/TWL LRG LVL3 (GOWN DISPOSABLE) ×2
IMMOBILIZER KNEE 22 (SOFTGOODS) ×2 IMPLANT
KIT BASIN OR (CUSTOM PROCEDURE TRAY) ×2 IMPLANT
KIT DRSG PREVENA PLUS 7DAY 125 (MISCELLANEOUS) ×2 IMPLANT
KIT TURNOVER KIT B (KITS) ×2 IMPLANT
MANIFOLD NEPTUNE II (INSTRUMENTS) ×2 IMPLANT
NDL SUT 6 .5 CRC .975X.05 MAYO (NEEDLE) ×1 IMPLANT
NEEDLE 18GX1X1/2 (RX/OR ONLY) (NEEDLE) ×2 IMPLANT
NEEDLE 22X1 1/2 (OR ONLY) (NEEDLE) IMPLANT
NEEDLE MAYO TAPER (NEEDLE) ×1
NEEDLE MAYO TROCAR (NEEDLE) ×2 IMPLANT
NS IRRIG 1000ML POUR BTL (IV SOLUTION) ×2 IMPLANT
PACK TOTAL JOINT (CUSTOM PROCEDURE TRAY) ×2 IMPLANT
PAD ABD 8X10 STRL (GAUZE/BANDAGES/DRESSINGS) ×2 IMPLANT
PAD ARMBOARD 7.5X6 YLW CONV (MISCELLANEOUS) ×4 IMPLANT
PAD CAST 4YDX4 CTTN HI CHSV (CAST SUPPLIES) ×1 IMPLANT
PADDING CAST COTTON 4X4 STRL (CAST SUPPLIES) ×1
PADDING CAST COTTON 6X4 STRL (CAST SUPPLIES) ×2 IMPLANT
PASSER SUT SWANSON 36MM LOOP (INSTRUMENTS) ×2 IMPLANT
PIN GUIDE FEMORAL STERILE (PIN) ×4 IMPLANT
STAPLER VISISTAT 35W (STAPLE) ×6 IMPLANT
SUCTION FRAZIER HANDLE 10FR (MISCELLANEOUS) ×1
SUCTION TUBE FRAZIER 10FR DISP (MISCELLANEOUS) ×1 IMPLANT
SUT ETHILON 2 0 FS 18 (SUTURE) ×6 IMPLANT
SUT FIBERWIRE #2 38 T-5 BLUE (SUTURE) ×4
SUT FIBERWIRE #5 38 CONV NDL (SUTURE)
SUT MNCRL AB 3-0 PS2 18 (SUTURE) ×2 IMPLANT
SUT MON AB 2-0 CT1 36 (SUTURE) ×2 IMPLANT
SUT STEEL 5 V 56 M (SUTURE) ×2 IMPLANT
SUT VIC AB 0 CT1 27 (SUTURE) ×1
SUT VIC AB 0 CT1 27XBRD ANBCTR (SUTURE) ×1 IMPLANT
SUT VIC AB 1 CT1 27 (SUTURE) ×1
SUT VIC AB 1 CT1 27XBRD ANBCTR (SUTURE) ×1 IMPLANT
SUT VIC AB 2-0 CT1 27 (SUTURE) ×2
SUT VIC AB 2-0 CT1 TAPERPNT 27 (SUTURE) ×2 IMPLANT
SUTURE FIBERWR #2 38 T-5 BLUE (SUTURE) ×2 IMPLANT
SUTURE FIBERWR #5 38 CONV NDL (SUTURE) IMPLANT
SYR 20ML ECCENTRIC (SYRINGE) ×2 IMPLANT
SYR 30ML SLIP (SYRINGE) ×2 IMPLANT
SYR CONTROL 10ML LL (SYRINGE) IMPLANT
TOWEL OR 17X24 6PK STRL BLUE (TOWEL DISPOSABLE) ×2 IMPLANT
TOWEL OR 17X26 10 PK STRL BLUE (TOWEL DISPOSABLE) ×4 IMPLANT
TUBE CONNECTING 12X1/4 (SUCTIONS) ×2 IMPLANT
UNDERPAD 30X30 (UNDERPADS AND DIAPERS) ×2 IMPLANT
WATER STERILE IRR 1000ML POUR (IV SOLUTION) ×2 IMPLANT
YANKAUER SUCT BULB TIP NO VENT (SUCTIONS) IMPLANT

## 2018-09-30 NOTE — Progress Notes (Signed)
Nutrition Brief Note  Patient identified on the Malnutrition Screening Tool (MST) Report.  Admit dx include: Accidental fall, initial encounter [W19.XXXA] Unable to ambulate [R26.2] Avulsion of right patellar tendon, initial encounter [I73.958G]  Wt Readings from Last 15 Encounters:  09/30/18 (!) 147 kg  09/29/18 (!) 140.6 kg  08/23/18 (!) 145.2 kg   Body mass index is 52.33 kg/m. Patient meets criteria for Obesity Class III based on current BMI.   Current diet order is Carbohydrate Modified. Reports a good appetite. Labs and medications reviewed.   No nutrition interventions warranted at this time. If nutrition issues arise, please consult RD.   Arthur Holms, RD, LDN Pager #: [phone] After-Hours Pager #: [phone]

## 2018-09-30 NOTE — Progress Notes (Signed)
Orthopedic Tech Progress Note Patient Details:  Brandon [name] Aug 31, 1945 [id number]  Patient ID: Brandon [name], male   DOB: 03-Dec-1945, 73 y.o.   MRN: [id number] Pt already has ohf.  Karolee Stamps 09/30/2018, 4:05 PM

## 2018-09-30 NOTE — Anesthesia Postprocedure Evaluation (Signed)
Anesthesia [name] Note  Patient: Brandon [name]  Procedure(s) Performed: PATELLA TENDON REPAIR (Right Knee) Application Of Wound Vac (Right Knee)     Patient location during evaluation: PACU Anesthesia Type: General Level of consciousness: sedated Pain management: pain level controlled Vital Signs Assessment: [name]-procedure vital signs reviewed and stable Respiratory status: spontaneous breathing and respiratory function stable Cardiovascular status: stable Postop Assessment: no apparent nausea or vomiting Anesthetic complications: no    Last Vitals:  Vitals:   09/30/18 1424 09/30/18 1443  BP: (!) 160/91 (!) 107/92  Pulse: 94 95  Resp: 15   Temp: 36.5 C 36.5 C  SpO2: 95% 92%    Last Pain:  Vitals:   09/30/18 1443  TempSrc: Oral  PainSc:                  Andrian Urbach DANIEL

## 2018-09-30 NOTE — Progress Notes (Signed)
Family Medicine Teaching Service Daily Progress Note Intern Pager: [phone]  Patient name: Brandon [name] Medical record number: [id number] Date of birth: 03-26-1946 Age: 73 y.o. Gender: male  Primary Care Provider: Helane Rima, MD Consultants: Orthopedics Code Status: DNR/DNI (discussed on admission)  **Subjective and Objective performed by Addison Naegeli, PGY-1 at 8pm. Assessment and plan is from [name] PGY-1 this morning **  Pt Overview and Major Events to Date:  09/29/2018 - Admitted for knee pain 2/2 Mechanical Fall  Assessment and Plan: Brandon [name] is a 73 y.o. male presenting with right patellar fracture requiring surgery s/p mechanical fall earlier this morning.  PMH is significant for MI,  DM2, HTN, HLD, gout, GERD, BPH, and alcohol abuse.   Right patellar avulsion fracture s/p Mech'l Fall: Xray showing nondisplaced first and second proximal phalanx fractures of R foot - to be managed nonoperatively and was provided with a postop shoe.  However on his way out of the ED he felt his knee give out but was able to catch his fall and did not land on the joint.  He returned home however had increasing knee pain and unable to rise from a sitting position.  Was transported back to the ED via ambulance. On repeat imaging was found to have an acute R patellar avulsion fracture.  Ortho was consulted by EDP and patient was admitted with likely plan for surgery in the AM.   - ortho consulted - appreciate recs  - f/u MRI R knee wo contrast  - pain control: morphine 2mg  q2h and tylenol PRN - AM CBC and BMP - non weight bearing  - PT/OT consult - vitals per unit routine  - Pending Vitamin D  T2DM, chronic, stable: Well controlled at home on Metformin 500 mg once daily. Last Hgb A1c 7.2 one month ago in 07/2018.   -holding home Metformin  -sSSI   -monitor CBGs   Alcohol use disorder: patient admits to drinking 8 beers every day. Was without alcohol during last hospital admission  and did not experience withdrawal. - monitor on CIWA - Ativan prn per protocol - folate, thiamine, multivitamin  HTN, chronic, stable: BP 134/83 this AM 3/5. Patient unsure exactly which medications he takes for HTN. He is listed as taking lisinopril, norvasc, clonidine. Propranolol is also listed under medications but he is unsure why or if he is taking it.  - continue amlodipine - hold lisinopril, clonidine & propranolol  - monitor blood pressures  HLD, Stable: Takes pravastatin 10 mg daily at home.   - continue pravastatin  Gout: patient unsure if he is taking this medication for gout. No clear documentation of gout in his records.  - hold home allopurinol   GERD: Stable, patient takes 10 mg prilosec at home daily  - protonix 40mg  qdaily    BPH: patient takes flomax 0.4 mg at bedtime and finasteride 5 mg at home.  - holding home meds. Consider restarting after surgery.   FEN/GI: NPO at MN in anticipation for OR in AM  Prophylaxis: heparin subQ   Disposition: admit to med-surg, attending Dr. Nori Riis   Subjective:  Patient doing well after surgery.  Only complaining of leg pain, which is worse after surgery. fPatient states he sometimes has to wear oxygen after surgery but has no oxygen requirements after surgery.    Objective: Temp:  [97.8 F (36.6 C)-98.3 F (36.8 C)] 98.3 F (36.8 C) (03/05 0138) Pulse Rate:  [79-83] 81 (03/05 0138) Resp:  [14-18] 14 (03/05 0138) BP: (  98-134)/(63-83) 134/83 (03/05 0138) SpO2:  [95 %-97 %] 95 % (03/04 2028) Weight:  [147 kg] 147 kg (03/04 1554) Physical Exam: General: alert and oriented.  Minimal distress.  Cardiovascular: regular rhythm. Normal rate.  2/6 systolic murmur.   Respiratory: lungs clear to auscultation b/l.  No wheezes or crackles.  Abdomen: soft, nontender.  Ventral hernia present.  Extremities: right leg immobilized in soft cast.    Laboratory: Recent Labs  Lab 09/29/18 1946 09/30/18 0310  WBC 8.9 6.9  HGB  11.6* 10.7*  HCT 37.6* 34.8*  PLT 166 158   Recent Labs  Lab 09/29/18 1946 09/30/18 0310  NA 131* 132*  K 4.3 4.5  CL 99 100  CO2 23 25  BUN 11 10  CREATININE 0.86 0.94  CALCIUM 9.3 9.1  GLUCOSE 118* 120*   Vit D and Calcium - pending  Imaging/Diagnostic Tests: Dg Lumbar Spine Complete  Result Date: 09/29/2018 CLINICAL DATA:  Fall with back pain EXAM: LUMBAR SPINE - COMPLETE 4+ VIEW COMPARISON:  CT abdomen pelvis 08/24/2018 FINDINGS: There is no evidence of lumbar spine fracture. Alignment is normal. There are flowing thoracic syndesmophytes. Calcific aortic atherosclerosis. Mild multilevel vertebral body height loss. IMPRESSION: No acute fracture or static subluxation of the lumbar spine. Electronically Signed   By: Ulyses Jarred M.D.   On: 09/29/2018 06:18   Dg Knee 2 Views Right  Result Date: 09/29/2018 CLINICAL DATA:  Fall from standing.  Pain and stiffness. EXAM: RIGHT KNEE - 1-2 VIEW COMPARISON:  None. FINDINGS: Acute avulsion fracture lower pole of patella and alignment. Mild high-riding patella. Severe medial compartment narrowing,, moderate within lateral compartment with marginal spurring and periarticular sclerosis. No destructive bony lesions. Advanced vascular calcifications. Large suprapatellar joint effusion. Prepatellar soft tissue swelling. IMPRESSION: 1. Acute patella avulsion fracture. Large suprapatellar joint effusion. 2. Severe medial compartment osteoarthrosis. Electronically Signed   By: Elon Alas M.D.   On: 09/29/2018 17:39   Dg Ankle Complete Right  Result Date: 09/29/2018 CLINICAL DATA:  Mechanical fall with foot pain. EXAM: RIGHT ANKLE - COMPLETE 3+ VIEW COMPARISON:  None. FINDINGS: Joint effusion and soft tissue swelling without acute fracture or malalignment. Periarticular calcifications without erosion, nonspecific. Heel spurs and osteopenia. IMPRESSION: Soft tissue swelling and joint effusion without acute fracture. Electronically Signed   By:  Monte Fantasia M.D.   On: 09/29/2018 06:28   Dg Foot Complete Right  Result Date: 09/29/2018 CLINICAL DATA:  Mechanical fall.  Foot pain. EXAM: RIGHT FOOT COMPLETE - 3+ VIEW COMPARISON:  None. FINDINGS: Second proximal phalanx proximal shaft fracture with minimal lateral impaction. Nondisplaced transverse shaft fracture of the first proximal phalanx. Osteopenia.  Hallux valgus and bunion. IMPRESSION: Nondisplaced first and second proximal phalanx fractures. Electronically Signed   By: Monte Fantasia M.D.   On: 09/29/2018 06:30   Dg Hip Unilat W Or Wo Pelvis 2-3 Views Right  Result Date: 09/29/2018 CLINICAL DATA:  Fall with right hip pain EXAM: DG HIP (WITH OR WITHOUT PELVIS) 2-3V RIGHT COMPARISON:  None. FINDINGS: There is no evidence of hip fracture or dislocation. Mild degenerative change of the right hip. IMPRESSION: No acute osseous abnormality of the right hip. Electronically Signed   By: Ulyses Jarred M.D.   On: 09/29/2018 06:20   [name], DO 09/30/2018, 8:11 AM PGY-1, [place] Intern pager: [phone], text pages welcome

## 2018-09-30 NOTE — Anesthesia Procedure Notes (Addendum)
Procedure Name: Intubation Date/Time: 09/30/2018 11:35 AM Performed by: Bryson Corona, CRNA Pre-anesthesia Checklist: Patient identified, Emergency Drugs available, Suction available and Patient being monitored Patient Re-evaluated:Patient Re-evaluated prior to induction Oxygen Delivery Method: Circle System Utilized Preoxygenation: Pre-oxygenation with 100% oxygen Induction Type: IV induction Ventilation: Mask ventilation without difficulty Laryngoscope Size: Mac and 4 Grade View: Grade II Tube type: Oral Tube size: 7.0 mm Number of attempts: 1 Airway Equipment and Method: Stylet and Oral airway Placement Confirmation: ETT inserted through vocal cords under direct vision,  positive ETCO2 and breath sounds checked- equal and bilateral Secured at: 22 cm Tube secured with: Tape Dental Injury: Teeth and Oropharynx as per pre-operative assessment

## 2018-09-30 NOTE — Transfer of Care (Signed)
Immediate Anesthesia Transfer of Care Note  Patient: Brandon [name] Post  Procedure(s) Performed: PATELLA TENDON REPAIR (Right Knee) Application Of Wound Vac (Right Knee)  Patient Location: PACU  Anesthesia Type:General  Level of Consciousness: awake and patient cooperative  Airway & Oxygen Therapy: Patient Spontanous Breathing and Patient connected to face mask oxygen  Post-op Assessment: Report given to RN and Post -op Vital signs reviewed and stable  Post vital signs: Reviewed and stable  Last Vitals:  Vitals Value Taken Time  BP 158/102 09/30/2018  1:28 PM  Temp    Pulse 96 09/30/2018  1:30 PM  Resp 17 09/30/2018  1:30 PM  SpO2 96 % 09/30/2018  1:30 PM  Vitals shown include unvalidated device data.  Last Pain:  Vitals:   09/30/18 1328  TempSrc:   PainSc: (P) 0-No pain         Complications: No apparent anesthesia complications

## 2018-09-30 NOTE — Anesthesia Preprocedure Evaluation (Addendum)
Anesthesia Evaluation  Patient identified by MRN, date of birth, ID band Patient awake    Reviewed: Allergy & Precautions, NPO status , Patient's Chart, lab work & pertinent test results  History of Anesthesia Complications Negative for: history of anesthetic complications  Airway Mallampati: II  TM Distance: >3 FB Neck ROM: Full    Dental  (+) Edentulous Upper, Poor Dentition, Dental Advisory Given   Pulmonary former smoker,    Pulmonary exam normal        Cardiovascular hypertension, Pt. on medications and Pt. on home beta blockers (-) angina+ CAD and + CABG   Rhythm:Regular + Systolic murmurs    Neuro/Psych negative neurological ROS     GI/Hepatic negative GI ROS, Neg liver ROS,   Endo/Other  diabetesMorbid obesity  Renal/GU negative Renal ROS     Musculoskeletal   Abdominal   Peds  Hematology negative hematology ROS (+)   Anesthesia Other Findings Day of surgery medications reviewed with the patient.  Reproductive/Obstetrics                            Anesthesia Physical Anesthesia Plan  ASA: III  Anesthesia Plan: General   Post-op Pain Management:    Induction: Intravenous  PONV Risk Score and Plan: 3 and Ondansetron, Dexamethasone and Scopolamine patch - Pre-op  Airway Management Planned: Oral ETT  Additional Equipment:   Intra-op Plan:   Post-operative Plan: Extubation in OR  Informed Consent: I have reviewed the patients History and Physical, chart, labs and discussed the procedure including the risks, benefits and alternatives for the proposed anesthesia with the patient or authorized representative who has indicated his/her understanding and acceptance.     Dental advisory given  Plan Discussed with: CRNA, Anesthesiologist and Surgeon  Anesthesia Plan Comments:        Anesthesia Quick Evaluation

## 2018-09-30 NOTE — Progress Notes (Signed)
OT Cancellation Note  Patient Details Name: Brandon [name] MRN: [id number] DOB: [birth year]   Cancelled Treatment:    Reason Eval/Treat Not Completed: Active bedrest order. Strict bedrest order. Pt currently NPO awaiting possible surgical intervention for patellar avulsion fx. Plan to reattempt once medically appropriate and activity orders have been increased.   Tyrone Schimke, OT Acute Rehabilitation Services Pager: [phone] Office: [phone]  09/30/2018, 9:37 AM

## 2018-09-30 NOTE — Progress Notes (Signed)
Orthopaedic Trauma Service (OTS)  Day of Surgery Procedure(s) (LRB): PATELLA TENDON REPAIR (Right)  I have reviewed the history and assessment by Dr. Ophelia Charter and agree to assume management of the patient for extensor mechanism repair on the right knee.  Subjective: Patient reports pain as moderate.    Objective: Current Vitals Blood pressure 134/83, pulse 81, temperature 98.3 F (36.8 C), temperature source Oral, resp. rate 14, height 5\' 6"  (1.676 m), weight (!) 147 kg, SpO2 95 %. Vital signs in last 24 hours: Temp:  [97.8 F (36.6 C)-98.3 F (36.8 C)] 98.3 F (36.8 C) (03/05 0138) Pulse Rate:  [79-83] 81 (03/05 0138) Resp:  [14-18] 14 (03/05 0138) BP: (98-134)/(63-83) 134/83 (03/05 0138) SpO2:  [95 %-97 %] 95 % (03/04 2028) Weight:  [147 kg] 147 kg (03/04 1554)  Intake/Output from previous day: 03/04 0701 - 03/05 0700 In: -  Out: 300 [Urine:300]  LABS Recent Labs    09/29/18 1946 09/30/18 0310  HGB 11.6* 10.7*   Recent Labs    09/29/18 1946 09/30/18 0310  WBC 8.9 6.9  RBC 4.14* 3.83*  HCT 37.6* 34.8*  PLT 166 158   Recent Labs    09/29/18 1946 09/30/18 0310  NA 131* 132*  K 4.3 4.5  CL 99 100  CO2 23 25  BUN 11 10  CREATININE 0.86 0.94  GLUCOSE 118* 120*  CALCIUM 9.3 9.1   No results for input(s): LABPT, INR in the last 72 hours.   Physical Exam RLE Tenderness with swelling of right knee, inability to perform straight leg raise  Confounded by radicular pain  Tenderness of forefoot  Edema/ swelling controlled  Sens: DPN, SPN, TN intact  Motor: EHL, FHL, and lessor toe ext and flex all intact grossly  Brisk cap refill, warm to touch  Assessment/Plan: ACUTE PATELLA TENDON/ EXTENSOR DISRUPTION (Right)  Right great toe and 2nd toe proximal phalanx fractures  I discussed with the patient the risks and benefits of surgery for right knee extensor repair, including the possibility of infection, nerve injury, vessel injury, wound breakdown,  arthritis, symptomatic hardware, DVT/ PE, loss of motion, malunion, nonunion, and need for further surgery among others.  We also specifically discussed the increased risk of infection given his prior knee issues and the importance of compliance until healing has been achieved.  He acknowledged these risks and wished to proceed.   Altamese Macon, MD [facility] [phone] (p)

## 2018-09-30 NOTE — Progress Notes (Signed)
Orthopedic Tech Progress Note Patient Details:  [name] 1946/06/01 [id number]  Patient ID: Brandon [name], male   DOB: October 18, 1945, 73 y.o.   MRN: [id number] Called in order to hanger.  Karolee Stamps 09/30/2018, 3:10 PM

## 2018-09-30 NOTE — Plan of Care (Signed)
  Problem: Health Behavior/Discharge Planning: Goal: Ability to manage health-related needs will improve Outcome: Progressing   Problem: Clinical Measurements: Goal: Ability to maintain clinical measurements within normal limits will improve Outcome: Progressing   

## 2018-09-30 NOTE — Progress Notes (Signed)
PT Cancellation Note  Patient Details Name: Brandon [name] MRN: [id number] DOB: 06-23-1946   Cancelled Treatment:    Reason Eval/Treat Not Completed: Active bedrest order. (strict bedrest per MD order) Pt currently NPO awaiting possible surgical intervention for patellar avulsion fx. PT to follow and proceed with eval when medically ready.   Lorriane Shire 09/30/2018, 8:33 AM   Lorrin Goodell, PT  Office # [phone] Pager [phone]

## 2018-10-01 DIAGNOSIS — Z419 Encounter for procedure for purposes other than remedying health state, unspecified: Secondary | ICD-10-CM

## 2018-10-01 LAB — CBC
HCT: 33.6 % — ABNORMAL LOW (ref 39.0–52.0)
Hemoglobin: 10 g/dL — ABNORMAL LOW (ref 13.0–17.0)
MCH: 27.7 pg (ref 26.0–34.0)
MCHC: 29.8 g/dL — ABNORMAL LOW (ref 30.0–36.0)
MCV: 93.1 fL (ref 80.0–100.0)
Platelets: 147 10*3/uL — ABNORMAL LOW (ref 150–400)
RBC: 3.61 MIL/uL — ABNORMAL LOW (ref 4.22–5.81)
RDW: 18.5 % — ABNORMAL HIGH (ref 11.5–15.5)
WBC: 7.8 10*3/uL (ref 4.0–10.5)
nRBC: 0 % (ref 0.0–0.2)

## 2018-10-01 LAB — BASIC METABOLIC PANEL
Anion gap: 6 (ref 5–15)
BUN: 11 mg/dL (ref 8–23)
CO2: 28 mmol/L (ref 22–32)
Calcium: 8.8 mg/dL — ABNORMAL LOW (ref 8.9–10.3)
Chloride: 100 mmol/L (ref 98–111)
Creatinine, Ser: 0.99 mg/dL (ref 0.61–1.24)
GFR calc Af Amer: >60 mL/min (ref >60)
GFR calc non Af Amer: >60 mL/min (ref >60)
Glucose, Bld: 136 mg/dL — ABNORMAL HIGH (ref 70–99)
Potassium: 4.4 mmol/L (ref 3.5–5.1)
Sodium: 134 mmol/L — ABNORMAL LOW (ref 135–145)

## 2018-10-01 LAB — GLUCOSE, CAPILLARY
Glucose-Capillary: 127 mg/dL — ABNORMAL HIGH (ref 70–99)
Glucose-Capillary: 134 mg/dL — ABNORMAL HIGH (ref 70–99)
Glucose-Capillary: 157 mg/dL — ABNORMAL HIGH (ref 70–99)
Glucose-Capillary: 153 mg/dL — ABNORMAL HIGH (ref 70–99)

## 2018-10-01 LAB — VITAMIN D 25 HYDROXY (VIT D DEFICIENCY, FRACTURES): Vit D, 25-Hydroxy: 34.8 ng/mL (ref 30.0–100.0)

## 2018-10-01 LAB — TSH: TSH: 0.794 u[IU]/mL (ref 0.350–4.500)

## 2018-10-01 MED ORDER — VITAMIN D 25 MCG (1000 UNIT) PO TABS
2000.0000 [IU] | ORAL_TABLET | Freq: Two times a day (BID) | ORAL | Status: DC
  Administered 2018-10-01 – 2018-10-08 (×14): 2000 [IU] via ORAL
  Filled 2018-10-01 (×14): qty 2

## 2018-10-01 NOTE — Progress Notes (Signed)
Family Medicine Teaching Service Daily Progress Note Intern Pager: [phone]  Patient name: Brandon [name] Medical record number: [id number] Date of birth: July 13, 1946 Age: 73 y.o. Gender: male  Primary Care Provider: Helane Rima, MD Consultants:Orthopedics Code Status:DNR/DNI (discussed on admission)  Pt Overview and Major Events to Date:  09/29/2018 - Admitted for knee pain 2/2 Mechanical Fall  Assessment and Plan: Kacen Cluteis a 73 y.o.malepresenting with right patellar fracture requiring surgery s/p mechanical fall earlier this morning.PMH is significant for MI, DM2, HTN, HLD, gout, GERD, BPH, andalcohol abuse.  Right patellar avulsion fracture s/p R Knee Extensor Repair: Xray showing nondisplaced first and second proximal phalanx fractures of R foot - to be managed nonoperatively and was provided with a postop shoe. -Ortho consulted - appreciate recs, patient wants to discharge home but Ortho recommending physical therapy/rehabilitation.  Family medicine agrees with this plan.  Recommend DEXA outpatient.  Assessing for metabolic bone disease, labs pending.  Recommending Lovenox at discharge x3 weeks due to increased risk for VTE.  No knee range of motion at this time, no active extension for 8 weeks, only passive extension until week 8, will likely keep patient braced with weightbearing activity until 12 weeks postop. - pain control: C code own and tylenol PRN - AM CBC and BMP - non weight bearing  - PT/OT consulted - recommend CIR - vitals per unit routine  T2DM, chronic, stable: Well controlled at home on Metformin 500 mg once daily. Last Hgb A1c 7.2 one month ago in 07/2018.  -holding home Metformin  -sSSI -monitor CBGs  Alcohol use disorder: patient admits to drinking 8 beers every day. CIWA 2 at 0600 03/06. -monitor onCIWA - Ativanprn per protocol - folate, thiamine, multivitamin  HTN, chronic, stable:BP 128/75 and 95/57 this AM 3/6. Patient unsure  exactly which medications he takes for HTN. He is listed as taking lisinopril, norvasc, clonidine. Propranolol is also listed under medications but he is unsure why or if he is taking it. - continue amlodipine 10mg  - hold lisinopril, clonidine & propranolol  - monitorblood pressures  HLD, Stable:Takes pravastatin 10 mg daily at home.  - continue pravastatin  Gout: patient unsure if he is taking this medication for gout. No clear documentation of gout in his records. - holdhomeallopurinol  GERD:Stable,patient takes 10 mg prilosec at home daily  - protonix 40mg  qdaily  BPH: patient takes flomax 0.4 mg at bedtime and finasteride 5 mg at home.  - holding home meds. Consider restarting after surgery.  FEN/GI:Carb modified Prophylaxis:heparin subQ  Disposition: Recommend discharge to CIR once medically stable  Subjective:  Patient seen resting and relaxing in bed, nasal cannula in place.  He denies headaches, chest pain, shortness of breath, abdominal pain.  States is right knee is sore but otherwise has no complaints or concerns.  Objective: Temp:  [97.7 F (36.5 C)-97.9 F (36.6 C)] 97.9 F (36.6 C) (03/06 0441) Pulse Rate:  [80-95] 80 (03/06 0441) Resp:  [15-30] 18 (03/06 0441) BP: (90-160)/(49-102) 128/75 (03/06 0441) SpO2:  [90 %-97 %] 91 % (03/06 0539) Weight:  [147 kg] 147 kg (03/05 1115) Physical Exam: General: Apparent distress, pleasant gentleman, ill-appearing Cardiovascular: RRR, S1-S2 present, no murmurs rubs or gallops Respiratory: CTA bilaterally Abdomen: Obese, soft, nontender, bowel sounds 4 quadrants Extremities: Right leg extended with brace, ecchymosis to pedal aspect of toes unchanged from prior exam, stable.  Brisk capillary refill to toes in bilateral extremities, no edema or evidence of DVT in left lower extremity.  Laboratory: Recent Labs  Lab 09/29/18 1946 09/30/18 0310 10/01/18 0228  WBC 8.9 6.9 7.8  HGB 11.6* 10.7* 10.0*   HCT 37.6* 34.8* 33.6*  PLT 166 158 147*   Recent Labs  Lab 09/29/18 1946 09/30/18 0310 10/01/18 0228  NA 131* 132* 134*  K 4.3 4.5 4.4  CL 99 100 100  CO2 23 25 28   BUN 11 10 11   CREATININE 0.86 0.94 0.99  CALCIUM 9.3 9.1 8.8*  GLUCOSE 118* 120* 136*   Pending metabolic disease work-up labs  Imaging/Diagnostic Tests: Dg Lumbar Spine Complete  Result Date: 09/29/2018 CLINICAL DATA:  Fall with back pain EXAM: LUMBAR SPINE - COMPLETE 4+ VIEW COMPARISON:  CT abdomen pelvis 08/24/2018 FINDINGS: There is no evidence of lumbar spine fracture. Alignment is normal. There are flowing thoracic syndesmophytes. Calcific aortic atherosclerosis. Mild multilevel vertebral body height loss. IMPRESSION: No acute fracture or static subluxation of the lumbar spine. Electronically Signed   By: Ulyses Jarred M.D.   On: 09/29/2018 06:18   Dg Knee 1-2 Views Right  Result Date: 09/30/2018 CLINICAL DATA:  Right knee patellar tendon repair. EXAM: DG C-ARM 61-120 MIN; RIGHT KNEE - 1-2 VIEW COMPARISON:  Right knee CT obtained earlier today. FINDINGS: AP and lateral C-arm views of the right knee demonstrate previously described tricompartmental degenerative changes. The distracted inferior patellar fracture has been reduced on the lateral view with mild residual anterior displacement. IMPRESSION: 1. Status post reduction of the inferior patellar fracture with mild residual anterior displacement. 2. Tricompartmental degenerative changes. Electronically Signed   By: Claudie Revering M.D.   On: 09/30/2018 15:04   Dg Knee 2 Views Right  Result Date: 09/29/2018 CLINICAL DATA:  Fall from standing.  Pain and stiffness. EXAM: RIGHT KNEE - 1-2 VIEW COMPARISON:  None. FINDINGS: Acute avulsion fracture lower pole of patella and alignment. Mild high-riding patella. Severe medial compartment narrowing,, moderate within lateral compartment with marginal spurring and periarticular sclerosis. No destructive bony lesions. Advanced  vascular calcifications. Large suprapatellar joint effusion. Prepatellar soft tissue swelling. IMPRESSION: 1. Acute patella avulsion fracture. Large suprapatellar joint effusion. 2. Severe medial compartment osteoarthrosis. Electronically Signed   By: Elon Alas M.D.   On: 09/29/2018 17:39   Dg Ankle Complete Right  Result Date: 09/29/2018 CLINICAL DATA:  Mechanical fall with foot pain. EXAM: RIGHT ANKLE - COMPLETE 3+ VIEW COMPARISON:  None. FINDINGS: Joint effusion and soft tissue swelling without acute fracture or malalignment. Periarticular calcifications without erosion, nonspecific. Heel spurs and osteopenia. IMPRESSION: Soft tissue swelling and joint effusion without acute fracture. Electronically Signed   By: Monte Fantasia M.D.   On: 09/29/2018 06:28   Ct Knee Right Wo Contrast  Result Date: 09/30/2018 CLINICAL DATA:  [place] yesterday. Unable to bear weight. Possible occult fracture. EXAM: CT OF THE right KNEE WITHOUT CONTRAST TECHNIQUE: Multidetector CT imaging of the right knee was performed according to the standard protocol. Multiplanar CT image reconstructions were also generated. COMPARISON:  Right knee radiographs 09/29/2018 FINDINGS: As demonstrated on the radiographs there is an acute avulsion type fracture involving the distal pole of the patella with associated wavy redundant appearance of the patellar tendon. No acute fracture of the tibia or femur is identified. The fibula is also intact. Advanced tricompartmental degenerative changes with joint space narrowing, osteophytic spurring, bony eburnation and subchondral cystic change. There is also chondrocalcinosis, a large joint effusion and osteophyte loose bodies in the joint. Findings suspicious for CPPD arthropathy. Moderate vascular calcifications are also noted. IMPRESSION: 1. Acute oblique coursing avulsion fracture involving  the distal pole of the patellar with associated wavy redundant appearance of the patellar tendon which  has lost its tension. 2. No other fractures are identified. 3. Advanced tricompartmental degenerative changes as detailed above. 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 4. Large joint effusion. Electronically Signed   By: Marijo Sanes M.D.   On: 09/30/2018 10:07   Dg Knee Right Port  Result Date: 09/30/2018 CLINICAL DATA:  Postop right patellar tendon repair EXAM: PORTABLE RIGHT KNEE - 1-2 VIEW COMPARISON:  09/30/2018, 09/29/2018, CT 09/30/2018 FINDINGS: Diffuse soft tissue swelling. Inferior patellar pole fracture with decreased separation of avulse fragment from parent bone. Soft tissue thickening and edema at the level of the patellar tendon. Knee effusion. Moderate arthritis of the patellofemoral compartment and advanced arthritis of the medial and lateral joint spaces. Vascular calcification. IMPRESSION: 1. Edema and soft tissue thickening at the infrapatellar soft tissues with decreased separation of lower pole patella fracture fragment from parent bone 2. Knee effusion. 3. Advanced arthritis of the knee Electronically Signed   By: Donavan Foil M.D.   On: 09/30/2018 17:02   Dg Foot Complete Right  Result Date: 09/29/2018 CLINICAL DATA:  Mechanical fall.  Foot pain. EXAM: RIGHT FOOT COMPLETE - 3+ VIEW COMPARISON:  None. FINDINGS: Second proximal phalanx proximal shaft fracture with minimal lateral impaction. Nondisplaced transverse shaft fracture of the first proximal phalanx. Osteopenia.  Hallux valgus and bunion. IMPRESSION: Nondisplaced first and second proximal phalanx fractures. Electronically Signed   By: Monte Fantasia M.D.   On: 09/29/2018 06:30   Dg C-arm 1-60 Min  Result Date: 09/30/2018 CLINICAL DATA:  Right knee patellar tendon repair. EXAM: DG C-ARM 61-120 MIN; RIGHT KNEE - 1-2 VIEW COMPARISON:  Right knee CT obtained earlier today. FINDINGS: AP and lateral C-arm views of the right knee demonstrate previously described tricompartmental degenerative changes. The distracted inferior patellar fracture  has been reduced on the lateral view with mild residual anterior displacement. IMPRESSION: 1. Status post reduction of the inferior patellar fracture with mild residual anterior displacement. 2. Tricompartmental degenerative changes. Electronically Signed   By: Claudie Revering M.D.   On: 09/30/2018 15:04   Dg Hip Unilat W Or Wo Pelvis 2-3 Views Right  Result Date: 09/29/2018 CLINICAL DATA:  Fall with right hip pain EXAM: DG HIP (WITH OR WITHOUT PELVIS) 2-3V RIGHT COMPARISON:  None. FINDINGS: There is no evidence of hip fracture or dislocation. Mild degenerative change of the right hip. IMPRESSION: No acute osseous abnormality of the right hip. Electronically Signed   By: Ulyses Jarred M.D.   On: 09/29/2018 06:20   [name], DO 10/01/2018, 9:54 AM PGY-1, [place] Intern pager: [phone], text pages welcome

## 2018-10-01 NOTE — Discharge Summary (Addendum)
Brandon [name]  Patient name: Brandon [name] Medical record number: [id number] Date of birth: [birth year] Age: 73 y.o. 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  Brief Hospital Course:  Brandon [name] is a 73 year old male admitted with right knee pain after a mechanical fall in which his knee gave out but he did not land on his joint.  MRI showed patellar avulsion fracture and orthopedic surgery was consulted.  A right knee extensor mechanism repair was performed on 10/01/2018.  Physical therapy evaluated the patient and recommended SNF placement. The patient remained stable throughout admission and was cleared for discharge to SNF on 10/08/2018.  Issues for Follow Up:  1. Ortho recs: Ice and elevate for swelling and pain control, Tylenol and minimal oxycodone for pain control, walker to aid with ambulatory function, recommend Ted hose for swelling, no knee range of motion at this time, no active knee extension for 8 weeks (only passive extension), keep patient brace with weightbearing activity until 12 weeks postop, follow-up with Ortho in 2 weeks (10/15/2018) 2. Continue the  patient on Lovenox x3 weeks due to increased risk for VTE secondary to obesity and immobility. 3. During his hospital course, several blood pressure medications were held and he remained normotensive. Please re-evaluate his need to continue these medications. 4. He reported NOT taking his Lasix medications so this was stopped on discharge. 5. Recommend DEXA scan as an outpatient. 6. Continue vitamin D and vitamin C supplementation 7. His FRAX score is 4.3% and should strongly consider starting treatment for osteoporosis.  Significant Procedures: 10/01/2018 - R Knee Extensor Mechanism Repair  Significant Labs and Imaging:  Recent Labs  Lab 10/03/18 0555 10/04/18 0331 10/05/18 0240  WBC 5.6 6.0 6.1  HGB 10.7* 10.8* 11.5*  HCT 35.9* 34.9* 37.8*  PLT 149* 166 186   Recent Labs  Lab 10/02/18 0354 10/03/18 0555 10/04/18 0331 10/05/18 0240  NA 133* 134* 133* 133*  K 4.6 4.4 4.4 4.2  CL 97* 101 97* 98  CO2 27 27 28 24   GLUCOSE 124* 113* 124* 107*  BUN 13 13 10 13   CREATININE 0.95 0.83 0.79 0.79  CALCIUM 8.9 9.1 9.0 9.3   Dg Lumbar Spine Complete  Result Date: 09/29/2018 CLINICAL DATA:  Fall with back pain EXAM: LUMBAR SPINE - COMPLETE 4+ VIEW COMPARISON:  CT abdomen pelvis 08/24/2018 FINDINGS: There is no evidence of lumbar spine fracture. Alignment is normal. There are flowing thoracic syndesmophytes. Calcific aortic atherosclerosis. Mild multilevel vertebral body height loss. IMPRESSION: No acute fracture or static subluxation of the lumbar spine. Electronically Signed   By: Ulyses Jarred M.D.   On: 09/29/2018 06:18   Dg Knee 1-2 Views Right  Result Date: 09/30/2018 CLINICAL DATA:  Right knee patellar tendon repair. EXAM: DG C-ARM 61-120 MIN; RIGHT KNEE - 1-2 VIEW COMPARISON:  Right knee CT obtained earlier today. FINDINGS: AP and lateral C-arm views of the right knee demonstrate previously described tricompartmental degenerative changes. The distracted inferior patellar fracture has  been reduced on the lateral view with mild residual anterior displacement. IMPRESSION: 1. Status post reduction of the inferior patellar fracture with mild residual anterior displacement. 2. Tricompartmental degenerative changes. Electronically Signed   By: Claudie Revering M.D.   On: 09/30/2018 15:04   Dg Knee 2 Views Right  Result Date: 09/29/2018 CLINICAL DATA:  Fall from standing.  Pain and stiffness. EXAM: RIGHT KNEE - 1-2 VIEW COMPARISON:  None. FINDINGS: Acute avulsion fracture lower pole of patella and alignment. Mild high-riding patella. Severe medial compartment narrowing,, moderate within lateral compartment with marginal spurring and periarticular sclerosis. No destructive bony lesions. Advanced vascular calcifications. Large suprapatellar joint effusion. Prepatellar soft tissue swelling. IMPRESSION: 1. Acute patella avulsion fracture. Large suprapatellar joint effusion. 2. Severe medial compartment osteoarthrosis. Electronically Signed   By: Elon Alas M.D.   On: 09/29/2018 17:39   Dg Ankle Complete Right  Result Date: 09/29/2018 CLINICAL DATA:  Mechanical fall with foot pain. EXAM: RIGHT ANKLE - COMPLETE 3+ VIEW COMPARISON:  None. FINDINGS: Joint effusion and soft tissue swelling without acute fracture or malalignment. Periarticular calcifications without erosion, nonspecific. Heel spurs and osteopenia. IMPRESSION: Soft tissue swelling and joint effusion without acute fracture. Electronically Signed   By: Monte Fantasia M.D.   On: 09/29/2018 06:28   Ct Knee Right Wo Contrast  Result Date: 09/30/2018 CLINICAL DATA:  [place] yesterday. Unable to bear weight. Possible occult fracture. EXAM: CT OF THE right KNEE WITHOUT CONTRAST TECHNIQUE: Multidetector CT imaging of the right knee was performed according to the standard protocol. Multiplanar CT image reconstructions were also generated. COMPARISON:  Right knee radiographs 09/29/2018 FINDINGS: As demonstrated on the radiographs there is an acute  avulsion type fracture involving the distal pole of the patella with associated wavy redundant appearance of the patellar tendon. No acute fracture of the tibia or femur is identified. The fibula is also intact. Advanced tricompartmental degenerative changes with joint space narrowing, osteophytic spurring, bony eburnation and subchondral cystic change. There is also chondrocalcinosis, a large joint effusion and osteophyte loose bodies in the joint. Findings suspicious for CPPD arthropathy. Moderate vascular calcifications are also noted. IMPRESSION: 1. Acute oblique coursing avulsion fracture involving the distal pole of the patellar with associated wavy redundant appearance of the patellar tendon which has lost its tension. 2. No other fractures are identified. 3. Advanced tricompartmental degenerative changes as detailed above. 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 4. Large joint effusion. Electronically Signed   By: Marijo Sanes M.D.   On: 09/30/2018 10:07   Dg Knee Right Port  Result Date: 09/30/2018 CLINICAL DATA:  Postop right patellar tendon repair EXAM: PORTABLE RIGHT KNEE - 1-2 VIEW COMPARISON:  09/30/2018, 09/29/2018, CT 09/30/2018 FINDINGS: Diffuse soft tissue swelling. Inferior patellar pole fracture with decreased separation of avulse fragment from parent bone. Soft tissue thickening and edema at the level of the patellar tendon. Knee effusion. Moderate arthritis of the patellofemoral compartment and advanced arthritis of the medial and lateral joint spaces. Vascular calcification. IMPRESSION: 1. Edema and soft tissue thickening at the infrapatellar soft tissues with decreased separation of lower pole patella fracture fragment from parent bone 2. Knee effusion. 3. Advanced arthritis of the knee Electronically Signed   By: Donavan Foil M.D.   On: 09/30/2018 17:02   Dg Foot Complete Right  Result Date: 09/29/2018 CLINICAL DATA:  Mechanical fall.  Foot pain. EXAM: RIGHT FOOT COMPLETE - 3+ VIEW COMPARISON:   None. FINDINGS: Second proximal phalanx proximal shaft fracture with minimal lateral impaction. Nondisplaced transverse shaft fracture of the first proximal phalanx. Osteopenia.  Hallux valgus and bunion. IMPRESSION: Nondisplaced first and second proximal phalanx fractures. Electronically Signed   By: Monte Fantasia M.D.   On: 09/29/2018 06:30   Dg C-arm 1-60 Min  Result Date: 09/30/2018 CLINICAL DATA:  Right knee patellar tendon repair. EXAM: DG C-ARM 61-120 MIN; RIGHT KNEE - 1-2 VIEW COMPARISON:  Right knee CT obtained earlier today. FINDINGS: AP and lateral C-arm views of the right knee demonstrate previously described tricompartmental degenerative changes. The distracted inferior patellar fracture has been reduced on the lateral view with mild residual anterior displacement. IMPRESSION: 1. Status post reduction of the inferior patellar fracture with mild residual anterior displacement. 2. Tricompartmental degenerative changes. Electronically Signed   By: Claudie Revering M.D.   On: 09/30/2018 15:04   Dg Hip Unilat W Or Wo Pelvis 2-3 Views Right  Result Date: 09/29/2018 CLINICAL DATA:  Fall with right hip pain EXAM: DG HIP (WITH OR WITHOUT PELVIS) 2-3V RIGHT COMPARISON:  None. FINDINGS: There is no evidence of hip fracture or dislocation. Mild degenerative change of the right hip. IMPRESSION: No acute osseous abnormality of the right hip. Electronically Signed   By: Ulyses Jarred M.D.   On: 09/29/2018 06:20    Results/Tests Pending at Time of Discharge: None  Discharge Medications:  Allergies as of 10/08/2018   No Known Allergies     Medication List    STOP taking these medications   cloNIDine 0.2 MG tablet Commonly known as:  CATAPRES   doxycycline 100 MG tablet Commonly known as:  VIBRA-TABS   furosemide 20 MG tablet Commonly known as:  LASIX   HYDROcodone-acetaminophen 5-325 MG tablet Commonly known as:  NORCO/VICODIN   ibuprofen 200 MG tablet Commonly known as:  ADVIL,MOTRIN    lisinopril 40 MG tablet Commonly known as:  PRINIVIL,ZESTRIL   Misc. Devices Misc   propranolol 40 MG tablet Commonly known as:  INDERAL     TAKE these medications   allopurinol 300 MG tablet Commonly known as:  ZYLOPRIM Take 300 mg by mouth daily.   amLODipine 10 MG tablet Commonly known as:  NORVASC Take 10 mg by mouth daily.   ascorbic acid 1000 MG tablet Commonly known as:  VITAMIN C Take 1 tablet (1,000 mg total) by mouth daily. Start taking on:  October 09, 2018   aspirin 81 MG tablet Take 81 mg by mouth daily.   cholecalciferol 25 MCG (1000 UT) tablet Commonly known as:  VITAMIN D3 Take 2 tablets (2,000 Units total) by mouth 2 (two) times daily.   enoxaparin 80 MG/0.8ML injection Commonly known as:  LOVENOX Inject 0.7 mLs (70 mg total) into the skin daily for 11 days. Start taking on:  October 09, 2018   ferrous sulfate 325 (65 FE) MG tablet Take 325 mg by mouth daily.   finasteride 5 MG tablet Commonly known as:  PROSCAR Take 5 mg by mouth daily.   folic acid 1 MG tablet Commonly known as:  FOLVITE Take 1 tablet (1 mg total) by mouth daily. Start taking on:  October 09, 2018   magnesium oxide 400 (241.3 Mg) MG tablet Commonly known as:  MAG-OX Take 1 tablet (400 mg total) by mouth daily.   metFORMIN 500 MG 24 hr tablet Commonly known as:  GLUCOPHAGE-XR Take 500 mg by mouth every morning.  multivitamin tablet Take 1 tablet by mouth daily.   NONFORMULARY OR COMPOUNDED ITEM Apply 1 application topically 2 (two) times daily as needed. Triamcinolone 0.1% and Aquaphor compound steroid cream, 3:1 ratio, 1 pound jar   omeprazole 10 MG capsule Commonly known as:  PRILOSEC Take 10 mg by mouth daily.   oxyCODONE 5 MG immediate release tablet Commonly known as:  Oxy IR/ROXICODONE Take 1 tablet (5 mg total) by mouth every 8 (eight) hours as needed for up to 3 days for moderate pain or severe pain.   polyethylene glycol packet Commonly known as:  MIRALAX /  GLYCOLAX Take 17 g by mouth 2 (two) times daily.   pravastatin 10 MG tablet Commonly known as:  PRAVACHOL Take 10 mg by mouth at bedtime.   senna-docusate 8.6-50 MG tablet Commonly known as:  Senokot-S Take 1 tablet by mouth 2 (two) times daily.   tamsulosin 0.4 MG Caps capsule Commonly known as:  FLOMAX Take 0.4 mg by mouth at bedtime.   thiamine 100 MG tablet Take 1 tablet (100 mg total) by mouth daily. Start taking on:  October 09, 2018      Discharge Instructions: Please refer to Patient Instructions section of EMR for full details.  Patient was counseled important signs and symptoms that should prompt return to medical care, changes in medications, dietary instructions, activity restrictions, and follow up appointments.   Follow-Up Appointments: Follow-up Information    [place], MD. Schedule an appointment as soon as possible for a visit in 10 day(s).   Specialty:  Orthopedic Surgery Contact information: [place] [address] [place], [place], DO 10/08/2018, 3:05 PM PGY-2, [place]

## 2018-10-01 NOTE — Progress Notes (Signed)
Rehab Admissions Coordinator Note:  Per OT recommendation, patient was screened by [name] for appropriateness for an Inpatient Acute Rehab Consult.  At this time, we are recommending Inpatient Rehab consult.  Contacted MD for Inpatient Rehab Consult Order.  Please contact me with questions.    [name] 10/01/2018, 12:12 PM  I can be reached at [phone].

## 2018-10-01 NOTE — Progress Notes (Signed)
OT Cancellation Note  Patient Details Name: Damareon Lanni MRN: [id number] DOB: 1946/01/21   Cancelled Treatment:    Reason Eval/Treat Not Completed: Active bedrest order Contacted MD to removed Bedrest orders. OT awaiting updated orders to start eval.  Jeri Modena, OTR/L  Acute Rehabilitation Services Pager: [phone] Office: [phone] .  Jeri Modena 10/01/2018, 10:08 AM

## 2018-10-01 NOTE — Care Management Important Message (Signed)
Important Message  Patient Details  Name: Rafael Salway MRN: [id number] Date of Birth: [birth year]   Medicare Important Message Given:  Yes    Shelda Altes 10/01/2018, 3:32 PM

## 2018-10-01 NOTE — Progress Notes (Signed)
Weaned patient to 1 L of oxygen.  Sating in the low 90's however he will dip down to 88-89 if oxygen is removed.

## 2018-10-01 NOTE — Evaluation (Addendum)
Physical Therapy Evaluation Patient Details Name: Brandon [name] MRN: [id number] DOB: 22-Jan-1946 Today's Date: 10/01/2018   History of Present Illness  73 year old male with past medical history of morbid obesity, diabetes, coronary artery disease who presents emergency department for pain and inability to ambulate.  Patient was seen early this morning in the emergency department after mechanical fall.  He suffered fractures in the right toes.  X-rays of the hips, lumbar spine, ankle were normal.  Patient reports that usually he gets around with a cane or walker at home.  Reports that while he was being assisted back into his vehicle after his emergency department visit this morning he twisted his knee because it was difficult to get in the car.  s/p patellar repair with wound vac and bledsoe brace in place.   Clinical Impression  Patient is s/p above surgery resulting in functional limitations due to the deficits listed below (see PT Problem List). Pt is limited in safe mobility by L LE pain with decreased ROM and strength. Pt requires minA for bed mobility, and modAx2 with transfers and ambulation of 3 feet with RW.  Patient will benefit from skilled PT to increase their independence and safety with mobility to allow discharge to the venue listed below.       Follow Up Recommendations CIR       Recommendations for Other Services Rehab consult     Precautions / Restrictions Precautions Precautions: Fall Restrictions Weight Bearing Restrictions: Yes RLE Weight Bearing: Weight bearing as tolerated      Mobility  Bed Mobility Overal bed mobility: Needs Assistance Bed Mobility: Supine to Sit;Rolling Rolling: Min guard   Supine to sit: Min guard     General bed mobility comments: pt requires heavy use of bed rail with HOB elevated  Transfers Overall transfer level: Needs assistance Equipment used: Rolling walker (2 wheeled) Transfers: Sit to/from [facility] Sit to  Stand: +2 physical assistance;Min assist Stand pivot transfers: +2 physical assistance;Mod assist;From elevated surface       General transfer comment: pt requires cues for Rw use and transfer safety. pt motivated and progressing  Ambulation/Gait Ambulation/Gait assistance: Mod assist;+2 safety/equipment Gait Distance (Feet): 3 Feet Assistive device: Rolling walker (2 wheeled) Gait Pattern/deviations: Step-to pattern;Decreased weight shift to right;Decreased stance time - right;Decreased step length - left;Antalgic;Shuffle;Trunk flexed     General Gait Details: modAx2 for steadying with gait to shuffle step from bed to recliner, vc for increased UE support, to aid in advancing R LE      Balance Overall balance assessment: Needs assistance           Standing balance-Leahy Scale: Poor Standing balance comment: reliance on RW                             Pertinent Vitals/Pain Pain Assessment: Faces Faces Pain Scale: Hurts even more Pain Location: R LE Pain Descriptors / Indicators: Operative site guarding Pain Intervention(s): Limited activity within patient's tolerance;Monitored during session;Repositioned    Home Living Family/patient expects to be discharged to:: Inpatient rehab Living Arrangements: Parent(lives with mother- ) Available Help at Discharge: Family Type of Home: Apartment Home Access: Level entry     Home Layout: One level Home Equipment: Grab bars - tub/shower;Grab bars - toilet;Bedside commode;Walker - 2 wheels;Walker - 4 wheels Additional Comments: has been sponge bathing/ drives for him    Prior Function Level of Independence: Independent with assistive device(s);Needs assistance  ADL's / Homemaking Assistance Needed: mother does all the cooking, they share the cleaning . Sponge bath  Comments: In community uses cane or RW.  Furniture/wall cruises in apartment     Hand Dominance   Dominant Hand: Right    Extremity/Trunk  Assessment   Upper Extremity Assessment Upper Extremity Assessment: Defer to OT evaluation    Lower Extremity Assessment Lower Extremity Assessment: RLE deficits/detail;Generalized weakness RLE Deficits / Details: R patellar fx ORIF, tendon repair, Bledsoe Brace locked in extension. RLE: Unable to fully assess due to pain;Unable to fully assess due to immobilization    Cervical / Trunk Assessment Cervical / Trunk Assessment: Kyphotic(obese)  Communication   Communication: No difficulties  Cognition Arousal/Alertness: Awake/alert Behavior During Therapy: WFL for tasks assessed/performed Overall Cognitive Status: Within Functional Limits for tasks assessed                                        General Comments General comments (skin integrity, edema, etc.): Pt on 2 L O2 on entry with SaO2 97%O2 with activity SaO2 dropped to 88%O2, in sitting O2 never rebounded above 88%O2, so supplemental O2 of 2L returned and SaO2 increased to 92%O2    Exercises     Assessment/Plan    PT Assessment Patient needs continued PT services  PT Problem List Decreased strength;Decreased range of motion;Decreased activity tolerance;Decreased balance;Decreased mobility;Decreased coordination;Decreased cognition;Decreased safety awareness;Pain;Obesity;Decreased knowledge of use of DME       PT Treatment Interventions DME instruction;Gait training;Functional mobility training;Therapeutic activities;Therapeutic exercise;Balance training;Cognitive remediation;Patient/family education    PT Goals (Current goals can be found in the Care Plan section)  Acute Rehab PT Goals Patient Stated Goal: to get back home as soon as i can PT Goal Formulation: With patient Time For Goal Achievement: 10/15/18 Potential to Achieve Goals: Good    Frequency Min 3X/week    AM-PAC PT "6 Clicks" Mobility  Outcome Measure Help needed turning from your back to your side while in a flat bed without using  bedrails?: A Little Help needed moving from lying on your back to sitting on the side of a flat bed without using bedrails?: A Little Help needed moving to and from a bed to a chair (including a wheelchair)?: A Lot Help needed standing up from a chair using your arms (e.g., wheelchair or bedside chair)?: A Lot Help needed to walk in hospital room?: A Lot Help needed climbing 3-5 steps with a railing? : Total 6 Click Score: 13    End of Session Equipment Utilized During Treatment: Gait belt;Oxygen Activity Tolerance: Patient limited by pain Patient left: in chair;with call bell/phone within reach;with chair alarm set Nurse Communication: Mobility status;Precautions;Weight bearing status PT Visit Diagnosis: Unsteadiness on feet (R26.81);Other abnormalities of gait and mobility (R26.89);Muscle weakness (generalized) (M62.81);History of falling (Z91.81);Difficulty in walking, not elsewhere classified (R26.2);Other symptoms and signs involving the nervous system (R29.898);Pain Pain - Right/Left: Right Pain - part of body: Knee    Time: 5053-9767 3419-3790 PT Time Calculation (min) (ACUTE ONLY): 28 min   Charges:   PT Evaluation $PT Eval Moderate Complexity: 1 Mod PT Treatments $Gait Training: 8-22 mins        Emory Leaver B. Migdalia Dk PT, DPT Acute Rehabilitation Services Pager [phone] Office [phone]   [place] 10/01/2018, 3:05 PM

## 2018-10-01 NOTE — Progress Notes (Signed)
PT Cancellation Note  Patient Details Name: Brandon [name] MRN: [id number] DOB: 1945-08-08   Cancelled Treatment:    Reason Eval/Treat Not Completed: (P) Other (comment)(Bed rest orders have not yet been lifted.) Pt has standing PT Eval Order with [facility] but also has bed rest orders.   Gladis Soley B. Migdalia Dk PT, DPT Acute Rehabilitation Services Pager [phone] Office [phone]  [place] 10/01/2018, 10:06 AM

## 2018-10-01 NOTE — Evaluation (Signed)
Occupational Therapy Evaluation Patient Details Name: Brandon [name] MRN: [id number] DOB: 07-27-1946 Today's Date: 10/01/2018    History of Present Illness 73 yo male s/p R patella tendon repair with wound vac PMH:   Clinical Impression   Patient is s/p R patella repair surgery resulting in functional limitations due to the deficits listed below (see OT problem list). Pt currently total+2 mod (A) to stand pivot to chair. Pt motivated and progressing. Patient will benefit from skilled OT acutely to increase independence and safety with ADLS to allow discharge CIR.     Follow Up Recommendations  CIR    Equipment Recommendations  3 in 1 bedside commode;Other (comment)(RW)    Recommendations for Other Services Rehab consult     Precautions / Restrictions Precautions Precautions: Fall Restrictions Weight Bearing Restrictions: Yes RLE Weight Bearing: Weight bearing as tolerated      Mobility Bed Mobility Overal bed mobility: Needs Assistance Bed Mobility: Supine to Sit;Rolling Rolling: Min guard   Supine to sit: Min guard     General bed mobility comments: pt requires heavy use of bed rail with HOB elevated  Transfers Overall transfer level: Needs assistance Equipment used: Rolling walker (2 wheeled) Transfers: Sit to/from [facility] Sit to Stand: +2 physical assistance;Min assist Stand pivot transfers: +2 physical assistance;Mod assist;From elevated surface       General transfer comment: pt requires cues for Rw use and transfer safety. pt motivated and progressing    Balance Overall balance assessment: Needs assistance           Standing balance-Leahy Scale: Poor Standing balance comment: reliance on RW                           ADL either performed or assessed with clinical judgement   ADL Overall ADL's : Needs assistance/impaired Eating/Feeding: Independent   Grooming: Independent;Sitting   Upper Body Bathing: Moderate  assistance   Lower Body Bathing: Total assistance   Upper Body Dressing : Moderate assistance   Lower Body Dressing: Total assistance   Toilet Transfer: +2 for physical assistance;Moderate assistance;RW Toilet Transfer Details (indicate cue type and reason): stand pivot Toileting- Clothing Manipulation and Hygiene: Total assistance         General ADL Comments: pt progressed from bed level to chair level this session. pt motivated to return home with elderly mother asap     Vision Baseline Vision/History: No visual deficits       Perception     Praxis      Pertinent Vitals/Pain Pain Assessment: Faces Faces Pain Scale: Hurts even more Pain Location: R LE Pain Descriptors / Indicators: Operative site guarding Pain Intervention(s): Monitored during session;Premedicated before session;Repositioned     Hand Dominance Right   Extremity/Trunk Assessment Upper Extremity Assessment Upper Extremity Assessment: Overall WFL for tasks assessed   Lower Extremity Assessment Lower Extremity Assessment: Defer to PT evaluation   Cervical / Trunk Assessment Cervical / Trunk Assessment: Kyphotic(obese)   Communication Communication Communication: No difficulties   Cognition Arousal/Alertness: Awake/alert Behavior During Therapy: WFL for tasks assessed/performed Overall Cognitive Status: Within Functional Limits for tasks assessed                                     General Comments  hinge brace locked and don at this time    Exercises     Shoulder Instructions  Home Living Family/patient expects to be discharged to:: Inpatient rehab Living Arrangements: Parent(lives with mother- ) Available Help at Discharge: Family Type of Home: Apartment Home Access: Level entry     Home Layout: One level     Bathroom Shower/Tub: Teacher, early years/pre: Standard     Home Equipment: Grab bars - tub/shower;Grab bars - toilet;Bedside  commode;Walker - 2 wheels;Walker - 4 wheels   Additional Comments: has been sponge bathing/ drives for him      Prior Functioning/Environment Level of Independence: Independent with assistive device(s);Needs assistance    ADL's / Homemaking Assistance Needed: mother does all the cooking, they share the cleaning . Sponge bath   Comments: In community uses cane or RW.  Furniture/wall cruises in apartment        OT Problem List: Decreased strength;Decreased activity tolerance;Impaired balance (sitting and/or standing);Decreased safety awareness;Decreased knowledge of use of DME or AE;Decreased knowledge of precautions;Cardiopulmonary status limiting activity;Obesity;Pain      OT Treatment/Interventions: Self-care/ADL training;Therapeutic exercise;Neuromuscular education;Energy conservation;DME and/or AE instruction;Manual therapy;Modalities;Therapeutic activities;Balance training;Patient/family education    OT Goals(Current goals can be found in the care plan section) Acute Rehab OT Goals Patient Stated Goal: to get back home as soon as i can OT Goal Formulation: With patient Time For Goal Achievement: 10/15/18 Potential to Achieve Goals: Good  OT Frequency: Min 3X/week   Barriers to D/C: Other (comment)(mother elderly but could call 911)          Co-evaluation              AM-PAC OT "6 Clicks" Daily Activity     Outcome Measure Help from another person eating meals?: None Help from another person taking care of personal grooming?: A Little Help from another person toileting, which includes using toliet, bedpan, or urinal?: A Lot Help from another person bathing (including washing, rinsing, drying)?: A Lot Help from another person to put on and taking off regular upper body clothing?: A Little Help from another person to put on and taking off regular lower body clothing?: A Lot 6 Click Score: 16   End of Session Equipment Utilized During Treatment: Gait belt;Rolling  walker Nurse Communication: Mobility status;Precautions  Activity Tolerance: Patient tolerated treatment well Patient left: in chair;with call bell/phone within reach;with chair alarm set  OT Visit Diagnosis: Unsteadiness on feet (R26.81);Muscle weakness (generalized) (M62.81)                Time: 0931-0950(1055-1105) OT Time Calculation (min): 19 min Charges:  OT General Charges $OT Visit: 1 Visit OT Evaluation $OT Eval Moderate Complexity: 1 Mod   Jeri Modena, OTR/L  Acute Rehabilitation Services Pager: [phone] Office: [phone] .   Jeri Modena 10/01/2018, 11:53 AM

## 2018-10-01 NOTE — Progress Notes (Signed)
Family Medicine Teaching Service Daily Progress Note Intern Pager: [phone]  Patient name: Brandon [name] Medical record number: [id number] Date of birth: January 13, 1946 Age: 73 y.o. Gender: male  Primary Care Provider: Helane Rima, MD Consultants: ortho Code Status: DNR  Pt Overview and Major Events to Date:  Hospital Day 2 Admitted: 09/29/2018 3/5 - patella repair surgery  Assessment and Plan: Bonham Zingale is a 73 y.o. male who presented with leg pain and found to have right first and second toe fractures and right patellar fracture. PMHx includes MI, DM2, HTN, HLD, gout, GERD, BPH, alcohol abuse, morbid obesity  R patellar avulsion fracutre s/p surgical repair - patient had mechanical fall in which he fractured his right patella and right first and second proximal phalanx. Toe fractures managed nonoperatively.  Assessing pt for metabolic bone disease.  Ortho recommending lovenox at discharge for 3 weeks due to increased risk for VTE.   - pain control: tylenol scheduled and oxycodone 5-10mg  PRN - am CBC, BMP - PT/OT consulted, recommending CIR - non weight bearing.   - PT/OT - recommending CIR; 3 in 1 bedside commode. CIR consult placed.   DM2 - well controlled on metformin 500mg  qdaily at home.  Last A1c 7.2 in January.  - hold metformin - sSSI - monitor CBGs - restart metformin -d/c insulin  Alcohol use disorder - patient states he drinks 8 beers daily.  Most recent CIWAs 2 - monitor CIWA w/ ativan PRN - folate, thiamine, multivitamin  HLD, stable - pravastatin 10mg  at home - continue home med  Gout - no clear documtation of gout in records. Pt not sure if taking.  - hold home allopurinol  GERD - pt takes 10mg  prilosec at home - protonix 40mg  qdaily  BPH - takes flomax 0.4mg  at bedtime and finasteride 5mg  at home.  - restart home meds  Electrolytes: none Nutrition: carb modified GI ppx: protonix DVT px: heparin subQ changed to lovenox  Disposition: CIR when  medically stable.    Medications: Scheduled Meds: . acetaminophen  650 mg Oral Q6H  . amLODipine  10 mg Oral Daily  . Chlorhexidine Gluconate Cloth  6 each Topical Daily  . cholecalciferol  2,000 Units Oral BID  . docusate sodium  100 mg Oral BID  . folic acid  1 mg Oral Daily  . heparin  5,000 Units Subcutaneous Q8H  . insulin aspart  0-5 Units Subcutaneous QHS  . insulin aspart  0-9 Units Subcutaneous TID WC  . multivitamin with minerals  1 tablet Oral Daily  . mupirocin ointment  1 application Nasal BID  . pantoprazole  40 mg Oral Daily  . pravastatin  10 mg Oral QHS  . sodium chloride flush  3 mL Intravenous Q12H  . thiamine  100 mg Oral Daily  . vitamin C  1,000 mg Oral Daily   Continuous Infusions: . sodium chloride    . lactated ringers 10 mL/hr at 09/30/18 1116   PRN Meds: sodium chloride, LORazepam **OR** LORazepam, metoCLOPramide **OR** metoCLOPramide (REGLAN) injection, ondansetron **OR** ondansetron (ZOFRAN) IV, oxyCODONE, polyethylene glycol, sodium chloride flush  ================================================= ================================================= Subjective:  Patient feels okay today.  He was in the chair from before lunch until after 5pm yesterday.  He was able to put some weight on his leg when transitioning. He says he had a skin biopsy done at [facility] in January.  Patient wanting to go home soon   Objective: Temp:  [97.7 F (36.5 C)-98.8 F (37.1 C)] 98.8 F (37.1 C) (03/06  1359) Pulse Rate:  [80-86] 84 (03/06 2045) Resp:  [17-18] 18 (03/06 2045) BP: (95-128)/(57-75) 118/73 (03/06 2045) SpO2:  [91 %-98 %] 93 % (03/06 2045) Intake/Output 03/05 0701 - 03/06 0700 In: 1550 [P.O.:50; I.V.:1150; IV Piggyback:350] Out: 525 [Urine:500; Blood:25] Physical Exam:  Gen: NAD, alert, oriented.  CV: Regular rate and rhythm.Radial pulses 2+ bilaterally. No bilateral lower extremity edema. Resp: Clear to auscultation bilaterally.  No wheezing,  rales, abnormal lung sounds.  No increased work of breathing appreciated. Abd: Nontender and nondistended on palpation to all 4 quadrants.  Positive bowel sounds. Ventral hernia present.  Psych: Cooperative with exam. Pleasant. Makes eye contact. Extremities: right leg immobilized in cast. He can lift that leg against gravity, but endorses pain when doing this. Skin : several areas of excoriations on arms, legs, upper chest and face.    Laboratory: Recent Labs  Lab 09/29/18 1946 09/30/18 0310 10/01/18 0228  WBC 8.9 6.9 7.8  HGB 11.6* 10.7* 10.0*  HCT 37.6* 34.8* 33.6*  PLT 166 158 147*   Recent Labs  Lab 09/29/18 1946 09/30/18 0310 10/01/18 0228  NA 131* 132* 134*  K 4.3 4.5 4.4  CL 99 100 100  CO2 23 25 28   BUN 11 10 11   CREATININE 0.86 0.94 0.99  CALCIUM 9.3 9.1 8.8*  GLUCOSE 118* 120* 136*     Imaging/Diagnostic Tests: Dg Knee 1-2 Views Right  Result Date: 09/30/2018 CLINICAL DATA:  Right knee patellar tendon repair. EXAM: DG C-ARM 61-120 MIN; RIGHT KNEE - 1-2 VIEW COMPARISON:  Right knee CT obtained earlier today. FINDINGS: AP and lateral C-arm views of the right knee demonstrate previously described tricompartmental degenerative changes. The distracted inferior patellar fracture has been reduced on the lateral view with mild residual anterior displacement. IMPRESSION: 1. Status post reduction of the inferior patellar fracture with mild residual anterior displacement. 2. Tricompartmental degenerative changes. Electronically Signed   By: Claudie Revering M.D.   On: 09/30/2018 15:04   Ct Knee Right Wo Contrast  Result Date: 09/30/2018 CLINICAL DATA:  [place] yesterday. Unable to bear weight. Possible occult fracture. EXAM: CT OF THE right KNEE WITHOUT CONTRAST TECHNIQUE: Multidetector CT imaging of the right knee was performed according to the standard protocol. Multiplanar CT image reconstructions were also generated. COMPARISON:  Right knee radiographs 09/29/2018 FINDINGS: As  demonstrated on the radiographs there is an acute avulsion type fracture involving the distal pole of the patella with associated wavy redundant appearance of the patellar tendon. No acute fracture of the tibia or femur is identified. The fibula is also intact. Advanced tricompartmental degenerative changes with joint space narrowing, osteophytic spurring, bony eburnation and subchondral cystic change. There is also chondrocalcinosis, a large joint effusion and osteophyte loose bodies in the joint. Findings suspicious for CPPD arthropathy. Moderate vascular calcifications are also noted. IMPRESSION: 1. Acute oblique coursing avulsion fracture involving the distal pole of the patellar with associated wavy redundant appearance of the patellar tendon which has lost its tension. 2. No other fractures are identified. 3. Advanced tricompartmental degenerative changes as detailed above. 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 4. Large joint effusion. Electronically Signed   By: Marijo Sanes M.D.   On: 09/30/2018 10:07   Dg Knee Right Port  Result Date: 09/30/2018 CLINICAL DATA:  Postop right patellar tendon repair EXAM: PORTABLE RIGHT KNEE - 1-2 VIEW COMPARISON:  09/30/2018, 09/29/2018, CT 09/30/2018 FINDINGS: Diffuse soft tissue swelling. Inferior patellar pole fracture with decreased separation of avulse fragment from parent bone. Soft tissue thickening and edema at  the level of the patellar tendon. Knee effusion. Moderate arthritis of the patellofemoral compartment and advanced arthritis of the medial and lateral joint spaces. Vascular calcification. IMPRESSION: 1. Edema and soft tissue thickening at the infrapatellar soft tissues with decreased separation of lower pole patella fracture fragment from parent bone 2. Knee effusion. 3. Advanced arthritis of the knee Electronically Signed   By: Donavan Foil M.D.   On: 09/30/2018 17:02   Dg C-arm 1-60 Min  Result Date: 09/30/2018 CLINICAL DATA:  Right knee patellar tendon repair.  EXAM: DG C-ARM 61-120 MIN; RIGHT KNEE - 1-2 VIEW COMPARISON:  Right knee CT obtained earlier today. FINDINGS: AP and lateral C-arm views of the right knee demonstrate previously described tricompartmental degenerative changes. The distracted inferior patellar fracture has been reduced on the lateral view with mild residual anterior displacement. IMPRESSION: 1. Status post reduction of the inferior patellar fracture with mild residual anterior displacement. 2. Tricompartmental degenerative changes. Electronically Signed   By: Claudie Revering M.D.   On: 09/30/2018 15:04      Benay Pike, MD 10/01/2018, 10:27 PM PGY-1, [place] Intern pager: [phone], text pages welcome

## 2018-10-01 NOTE — Plan of Care (Signed)
  Problem: Education: Goal: Knowledge of General Education information will improve Description Including pain rating scale, medication(s)/side effects and non-pharmacologic comfort measures Outcome: Progressing   Problem: Health Behavior/Discharge Planning: Goal: Ability to manage health-related needs will improve Outcome: Progressing   Problem: Clinical Measurements: Goal: Ability to maintain clinical measurements within normal limits will improve Outcome: Progressing Goal: Will remain free from infection Outcome: Progressing Goal: Diagnostic test results will improve Outcome: Progressing Goal: Respiratory complications will improve Outcome: Progressing Goal: Cardiovascular complication will be avoided Outcome: Progressing Goal: Will remain free from infection Outcome: Progressing   Problem: Activity: Goal: Risk for activity intolerance will decrease Outcome: Progressing   Problem: Nutrition: Goal: Adequate nutrition will be maintained Outcome: Progressing   Problem: Coping: Goal: Level of anxiety will decrease Outcome: Progressing   Problem: Elimination: Goal: Will not experience complications related to bowel motility Outcome: Progressing Goal: Will not experience complications related to urinary retention Outcome: Progressing   Problem: Pain Managment: Goal: General experience of comfort will improve Outcome: Progressing   Problem: Safety: Goal: Ability to remain free from injury will improve Outcome: Progressing   Problem: Skin Integrity: Goal: Risk for impaired skin integrity will decrease Outcome: Progressing   Problem: Pain Managment: Goal: General experience of comfort will improve Outcome: Progressing   Problem: Safety: Goal: Ability to remain free from injury will improve Outcome: Progressing   Problem: Skin Integrity: Goal: Risk for impaired skin integrity will decrease Outcome: Progressing

## 2018-10-01 NOTE — Progress Notes (Addendum)
Orthopedic Trauma Service Progress Note  Patient ID: Coulson Wehner MRN: [id number] DOB/AGE: 1945/12/06 73 y.o.  Subjective:  Sitting in bedside chair Has been in chair about 2 hours  Doing ok overall Moderate pain in R knee  Really wants to go home Lives in apartment with his 54 y/o mother. States they take care of each other States son lives near by  I asked bout being able to drive around and run errands such as grocery shopping and getting to and from MD appointments. States his son can help    Review of Systems  Constitutional: Negative for chills and fever.  Respiratory: Negative for shortness of breath and wheezing.   Cardiovascular: Negative for chest pain and palpitations.  Gastrointestinal: Negative for nausea and vomiting.    Objective:   VITALS:   Vitals:   09/30/18 2025 09/30/18 2255 10/01/18 0441 10/01/18 0539  BP: (!) 90/49  128/75   Pulse: 82  80   Resp: 18  18   Temp:  97.7 F (36.5 C) 97.9 F (36.6 C)   TempSrc:  Oral Oral   SpO2: 97%  95% 91%  Weight:      Height:        Estimated body mass index is 52.33 kg/m as calculated from the following:   Height as of this encounter: 5' 5.98" (1.676 m).   Weight as of this encounter: 147 kg.   Intake/Output      03/05 0701 - 03/06 0700 03/06 0701 - 03/07 0700   P.O. 50    I.V. (mL/kg) 1150 (7.8)    IV Piggyback 350    Total Intake(mL/kg) 1550 (10.5)    Urine (mL/kg/hr) 500 (0.1)    Drains 0    Blood 25    Total Output 525    Net +1025         Urine Occurrence 2 x      LABS  Results for orders placed or performed during the hospital encounter of 09/29/18 (from the past 24 hour(s))  Glucose, capillary     Status: Abnormal   Collection Time: 09/30/18  1:30 PM  Result Value Ref Range   Glucose-Capillary 112 (H) 70 - 99 mg/dL   Comment 1 Notify RN   Glucose, capillary     Status: Abnormal   Collection Time: 09/30/18   4:55 PM  Result Value Ref Range   Glucose-Capillary 185 (H) 70 - 99 mg/dL  Glucose, capillary     Status: Abnormal   Collection Time: 09/30/18 10:14 PM  Result Value Ref Range   Glucose-Capillary 149 (H) 70 - 99 mg/dL  CBC     Status: Abnormal   Collection Time: 10/01/18  2:28 AM  Result Value Ref Range   WBC 7.8 4.0 - 10.5 K/uL   RBC 3.61 (L) 4.22 - 5.81 MIL/uL   Hemoglobin 10.0 (L) 13.0 - 17.0 g/dL   HCT 33.6 (L) 39.0 - 52.0 %   MCV 93.1 80.0 - 100.0 fL   MCH 27.7 26.0 - 34.0 pg   MCHC 29.8 (L) 30.0 - 36.0 g/dL   RDW 18.5 (H) 11.5 - 15.5 %   Platelets 147 (L) 150 - 400 K/uL   nRBC 0.0 0.0 - 0.2 %  Basic metabolic panel     Status: Abnormal   Collection Time: 10/01/18  2:28 AM  Result Value Ref Range   Sodium 134 (L) 135 - 145 mmol/L   Potassium 4.4 3.5 - 5.1 mmol/L   Chloride 100 98 - 111 mmol/L   CO2 28 22 - 32 mmol/L   Glucose, Bld 136 (H) 70 - 99 mg/dL   BUN 11 8 - 23 mg/dL   Creatinine, Ser 0.99 0.61 - 1.24 mg/dL   Calcium 8.8 (L) 8.9 - 10.3 mg/dL   GFR calc non Af Amer >60 >60 mL/min   GFR calc Af Amer >60 >60 mL/min   Anion gap 6 5 - 15  TSH     Status: None   Collection Time: 10/01/18  2:28 AM  Result Value Ref Range   TSH 0.794 0.350 - 4.500 uIU/mL  Glucose, capillary     Status: Abnormal   Collection Time: 10/01/18  7:33 AM  Result Value Ref Range   Glucose-Capillary 127 (H) 70 - 99 mg/dL  Glucose, capillary     Status: Abnormal   Collection Time: 10/01/18 12:04 PM  Result Value Ref Range   Glucose-Capillary 134 (H) 70 - 99 mg/dL     PHYSICAL EXAM:   Gen: sitting up in chair, NAD, talkative but unhealthy appearing male   Ext:       Right Lower Extremity   Dressing c/d/i  Hinged brace fitting well   prevena functioning   Ext is warm   Ecchymosis stable to toes   Motor and sensory functions intact distally   + DP pulse  Compartments are soft   No pain with passive stretching    Assessment/Plan: 1 Day Post-Op   Principal Problem:   Patella  fracture, extensor mechanism disruption R knee  Active Problems:   CAD (coronary artery disease)   DM (diabetes mellitus), type 2 (HCC)   Obesity BMI over 52   Anti-infectives (From admission, onward)   Start     Dose/Rate Route Frequency Ordered Stop   09/30/18 1800  ceFAZolin (ANCEF) IVPB 2g/100 mL premix     2 g 200 mL/hr over 30 Minutes Intravenous Every 6 hours 09/30/18 1440 10/01/18 1159   09/30/18 1130  ceFAZolin (ANCEF) 3 g in dextrose 5 % 50 mL IVPB  Status:  Discontinued     3 g 100 mL/hr over 30 Minutes Intravenous  Once 09/30/18 1115 09/30/18 1440   09/30/18 1116  ceFAZolin (ANCEF) 2-4 GM/100ML-% IVPB    Note to Pharmacy:  Bobbie Stack   : cabinet override      09/30/18 1116 09/30/18 2329    .  POD/HD#: 1  73 y/o male s/p ground level fall with R toe fractures and R knee extensor mechanism disruption   -R knee extensor mechanism disruption s/p repair  WBAT R leg with knee in full extension only with brace on    Can be in knee immobilizer or in hinged knee brace locked in full extension   Walker to aide with ambulatory function   Dressing change tomorrow or Sunday if pt discharges otherwise would like to leave prevena on for 7 days    After prevena removed will go to dry dressings only. Can leave uncovered if there is no drainage    Would recommend TED hose for swelling control. Can be knee high     High risk for complications such as re-rupture given history of infection in that knee and body habitus   PT/OT   No knee ROM at this time   Ok to do ankle therex  Will advance knee ROM in 2 weeks. No active extension for 8 weeks    0-30 degrees flexion after post op week 2    0-60 degrees flexion after post op week 4    0-90 degrees flexion after post op week 5    All flexion will be active and passive    Only passive extension until week 8       Will likely keep pt braced with WB activity until 12 weeks post op    Ice and elevate for swelling and pain control    - Pain management:  Titrate accordingly   On scheduled tylenol    Oxy use has been very minimal   - ABL anemia/Hemodynamics  Stable  - Medical issues   Per medical team    Vitamin d levels are fair    Additional metabolic bone labs are pending   - DVT/PE prophylaxis:  On SQ heparin   His surgery itself is a low risk procedure but pts obesity and immobility puts him at risk for VTE   Would likely recommend lovenox at dc x 3 weeks  - ID:   periop abx completed   - Metabolic Bone Disease:  Labs pending   Clinically very poor bone quality   Recommend DEXA as an outpt    Continue vitamin d and vitamin c supplementation   - Activity:  Up with therapy   As above  - Impediments to fracture healing:  Obesity   Hx of infections process R knee   Chronic EtOH use   DM    - Dispo:  Therapy evals  CIR consult   Not sure dc home directly from hospital the best decision   Follow up with ortho in 2 weeks     Jari Pigg, PA-C [phone] (C) 10/01/2018, 1:12 PM  [facility] [place] [address] [phone] Domingo Sep (F)

## 2018-10-02 LAB — CBC
HCT: 33.4 % — ABNORMAL LOW (ref 39.0–52.0)
Hemoglobin: 9.9 g/dL — ABNORMAL LOW (ref 13.0–17.0)
MCH: 28 pg (ref 26.0–34.0)
MCHC: 29.6 g/dL — ABNORMAL LOW (ref 30.0–36.0)
MCV: 94.6 fL (ref 80.0–100.0)
Platelets: 152 10*3/uL (ref 150–400)
RBC: 3.53 MIL/uL — ABNORMAL LOW (ref 4.22–5.81)
RDW: 18.3 % — ABNORMAL HIGH (ref 11.5–15.5)
WBC: 7.6 10*3/uL (ref 4.0–10.5)
nRBC: 0 % (ref 0.0–0.2)

## 2018-10-02 LAB — SEX HORMONE BINDING GLOBULIN: Sex Hormone Binding: 39.9 nmol/L (ref 19.3–76.4)

## 2018-10-02 LAB — BASIC METABOLIC PANEL
Anion gap: 9 (ref 5–15)
BUN: 13 mg/dL (ref 8–23)
CO2: 27 mmol/L (ref 22–32)
Calcium: 8.9 mg/dL (ref 8.9–10.3)
Chloride: 97 mmol/L — ABNORMAL LOW (ref 98–111)
Creatinine, Ser: 0.95 mg/dL (ref 0.61–1.24)
GFR calc Af Amer: >60 mL/min (ref >60)
GFR calc non Af Amer: >60 mL/min (ref >60)
Glucose, Bld: 124 mg/dL — ABNORMAL HIGH (ref 70–99)
Potassium: 4.6 mmol/L (ref 3.5–5.1)
Sodium: 133 mmol/L — ABNORMAL LOW (ref 135–145)

## 2018-10-02 LAB — GLUCOSE, CAPILLARY
Glucose-Capillary: 103 mg/dL — ABNORMAL HIGH (ref 70–99)
Glucose-Capillary: 128 mg/dL — ABNORMAL HIGH (ref 70–99)
Glucose-Capillary: 188 mg/dL — ABNORMAL HIGH (ref 70–99)
Glucose-Capillary: 123 mg/dL — ABNORMAL HIGH (ref 70–99)

## 2018-10-02 LAB — TESTOSTERONE, FREE: Testosterone, Free: 1.9 pg/mL — ABNORMAL LOW (ref 6.6–18.1)

## 2018-10-02 LAB — TESTOSTERONE: Testosterone: 119 ng/dL — ABNORMAL LOW (ref 264–916)

## 2018-10-02 LAB — PTH, INTACT AND CALCIUM
Calcium, Total (PTH): 8.7 mg/dL (ref 8.6–10.2)
PTH: 24 pg/mL (ref 15–65)

## 2018-10-02 MED ORDER — FINASTERIDE 5 MG PO TABS
5.0000 mg | ORAL_TABLET | Freq: Every day | ORAL | Status: DC
  Administered 2018-10-02 – 2018-10-08 (×7): 5 mg via ORAL
  Filled 2018-10-02 (×7): qty 1

## 2018-10-02 MED ORDER — METFORMIN HCL 500 MG PO TABS
500.0000 mg | ORAL_TABLET | Freq: Every day | ORAL | Status: DC
  Administered 2018-10-03 – 2018-10-08 (×6): 500 mg via ORAL
  Filled 2018-10-02 (×6): qty 1

## 2018-10-02 MED ORDER — HYDROXYZINE HCL 25 MG PO TABS
25.0000 mg | ORAL_TABLET | Freq: Three times a day (TID) | ORAL | Status: DC | PRN
  Administered 2018-10-03 – 2018-10-05 (×3): 25 mg via ORAL
  Filled 2018-10-02 (×3): qty 1

## 2018-10-02 MED ORDER — TAMSULOSIN HCL 0.4 MG PO CAPS
0.4000 mg | ORAL_CAPSULE | Freq: Every day | ORAL | Status: DC
  Administered 2018-10-02 – 2018-10-08 (×7): 0.4 mg via ORAL
  Filled 2018-10-02 (×7): qty 1

## 2018-10-02 MED ORDER — ENOXAPARIN SODIUM 40 MG/0.4ML ~~LOC~~ SOLN
40.0000 mg | SUBCUTANEOUS | Status: DC
  Administered 2018-10-02 – 2018-10-07 (×6): 40 mg via SUBCUTANEOUS
  Filled 2018-10-02 (×6): qty 0.4

## 2018-10-02 NOTE — Plan of Care (Signed)
  Problem: Clinical Measurements: Goal: Will remain free from infection Outcome: Progressing   Problem: Clinical Measurements: Goal: Respiratory complications will improve Outcome: Progressing   Problem: Clinical Measurements: Goal: Cardiovascular complication will be avoided Outcome: Progressing   Problem: Coping: Goal: Level of anxiety will decrease Outcome: Progressing   Problem: Nutrition: Goal: Adequate nutrition will be maintained Outcome: Progressing   Problem: Elimination: Goal: Will not experience complications related to bowel motility Outcome: Progressing   Problem: Pain Managment: Goal: General experience of comfort will improve Outcome: Progressing   Problem: Skin Integrity: Goal: Risk for impaired skin integrity will decrease Outcome: Progressing   Problem: Safety: Goal: Ability to remain free from injury will improve Outcome: Progressing   Problem: Skin Integrity: Goal: Risk for impaired skin integrity will decrease Outcome: Progressing

## 2018-10-02 NOTE — Progress Notes (Signed)
SPORTS MEDICINE AND JOINT REPLACEMENT  Lara Mulch, MD    Carlyon Shadow, PA-C [place], [place], [place]  [address]                             [phone]   PROGRESS NOTE  Subjective:  negative for Chest Pain  negative for Shortness of Breath  negative for Nausea/Vomiting   negative for Calf Pain  negative for Bowel Movement   Tolerating Diet: yes         Patient reports pain as 4 on 0-10 scale.    Objective: Vital signs in last 24 hours:    Patient Vitals for the past 24 hrs:  BP Temp Temp src Pulse Resp SpO2  10/02/18 0426 124/70 98 F (36.7 C) Oral 85 16 97 %  10/01/18 2353 127/80 - - 80 - -  10/01/18 2045 118/73 - - 84 18 93 %  10/01/18 1359 (!) 95/57 98.8 F (37.1 C) Oral 86 17 98 %    @flow {1959:LAST@   Intake/Output from previous day:   03/06 0701 - 03/07 0700 In: 720 [P.O.:720] Out: 850 [Urine:850]   Intake/Output this shift:   No intake/output data recorded.   Intake/Output      03/06 0701 - 03/07 0700 03/07 0701 - 03/08 0700   P.O. 720    I.V. (mL/kg)     IV Piggyback     Total Intake(mL/kg) 720 (4.9)    Urine (mL/kg/hr) 850 (0.2)    Drains     Blood     Total Output 850    Net -130         Urine Occurrence 2 x       LABORATORY DATA: Recent Labs    09/29/18 1946 09/30/18 0310 10/01/18 0228 10/02/18 0354  WBC 8.9 6.9 7.8 7.6  HGB 11.6* 10.7* 10.0* 9.9*  HCT 37.6* 34.8* 33.6* 33.4*  PLT 166 158 147* 152   Recent Labs    09/29/18 1946 09/30/18 0310 10/01/18 0228 10/02/18 0354  NA 131* 132* 134* 133*  K 4.3 4.5 4.4 4.6  CL 99 100 100 97*  CO2 23 25 28 27   BUN 11 10 11 13   CREATININE 0.86 0.94 0.99 0.95  GLUCOSE 118* 120* 136* 124*  CALCIUM 9.3 9.1 8.8*  8.7 8.9   No results found for: INR, PROTIME  Examination:  General appearance: alert, cooperative and no distress Extremities: extremities normal, atraumatic, no cyanosis or edema  Wound Exam: clean, dry, intact   Drainage:  None: wound tissue  dry  Motor Exam: Quadriceps and Hamstrings Intact  Sensory Exam: Superficial Peroneal, Deep Peroneal and Tibial normal   Assessment:    2 Days Post-Op  Procedure(s) (LRB): PATELLA TENDON REPAIR (Right) Application Of Wound Vac (Right)  ADDITIONAL DIAGNOSIS:  Principal Problem:   Patella fracture, extensor mechanism disruption R knee  Active Problems:   CAD (coronary artery disease)   DM (diabetes mellitus), type 2 (HCC)   Obesity BMI over 52     Plan: Physical Therapy as ordered Weight Bearing as Tolerated (WBAT)  DVT Prophylaxis:  SQ Heparin  DISCHARGE PLAN: CIR consult  Patient doing well and resting comfortably. No drainage from wound. Prevena still intact. Medicine following  Donia Ast 10/02/2018, 7:44 AM

## 2018-10-02 NOTE — Plan of Care (Signed)
  Problem: Education: Goal: Knowledge of General Education information will improve Description: Including pain rating scale, medication(s)/side effects and non-pharmacologic comfort measures Outcome: Progressing   Problem: Coping: Goal: Level of anxiety will decrease Outcome: Progressing   

## 2018-10-03 LAB — CBC
HCT: 35.9 % — ABNORMAL LOW (ref 39.0–52.0)
Hemoglobin: 10.7 g/dL — ABNORMAL LOW (ref 13.0–17.0)
MCH: 27.8 pg (ref 26.0–34.0)
MCHC: 29.8 g/dL — ABNORMAL LOW (ref 30.0–36.0)
MCV: 93.2 fL (ref 80.0–100.0)
Platelets: 149 10*3/uL — ABNORMAL LOW (ref 150–400)
RBC: 3.85 MIL/uL — ABNORMAL LOW (ref 4.22–5.81)
RDW: 18.3 % — ABNORMAL HIGH (ref 11.5–15.5)
WBC: 5.6 10*3/uL (ref 4.0–10.5)
nRBC: 0 % (ref 0.0–0.2)

## 2018-10-03 LAB — BASIC METABOLIC PANEL
Anion gap: 6 (ref 5–15)
BUN: 13 mg/dL (ref 8–23)
CO2: 27 mmol/L (ref 22–32)
Calcium: 9.1 mg/dL (ref 8.9–10.3)
Chloride: 101 mmol/L (ref 98–111)
Creatinine, Ser: 0.83 mg/dL (ref 0.61–1.24)
GFR calc Af Amer: >60 mL/min (ref >60)
GFR calc non Af Amer: >60 mL/min (ref >60)
Glucose, Bld: 113 mg/dL — ABNORMAL HIGH (ref 70–99)
Potassium: 4.4 mmol/L (ref 3.5–5.1)
Sodium: 134 mmol/L — ABNORMAL LOW (ref 135–145)

## 2018-10-03 LAB — GLUCOSE, CAPILLARY
Glucose-Capillary: 112 mg/dL — ABNORMAL HIGH (ref 70–99)
Glucose-Capillary: 119 mg/dL — ABNORMAL HIGH (ref 70–99)
Glucose-Capillary: 181 mg/dL — ABNORMAL HIGH (ref 70–99)
Glucose-Capillary: 106 mg/dL — ABNORMAL HIGH (ref 70–99)

## 2018-10-03 NOTE — Progress Notes (Signed)
Family Medicine Teaching Service Daily Progress Note Intern Pager: [phone]  Patient name: Brandon [name] Medical record number: [id number] Date of birth: 07-07-1946 Age: 73 y.o. Gender: male  Primary Care Provider: Helane Rima, MD Consultants: ortho Code Status: DNR  Pt Overview and Major Events to Date:  09/29/2018: Admitted 09/30/2018: Patellar repair surgery  Assessment and Plan: Brandon [name] is a 73 y.o. male who presented with leg pain and found to have right first and second toe fractures and right patellar fracture. PMHx includes MI, DM2, HTN, HLD, gout, GERD, BPH, alcohol abuse, and morbid obesity.  R patellar avulsion fracture s/p surgical repair, improving, day 3 s/p surgery: patient had mechanical fall in which he fractured his right patella.  - Ortho recommending lovenox at discharge for 3 weeks due to increased risk for VTE.   - pain control: tylenol scheduled and oxycodone 5-10mg  PRN.  Received total of oxycodone 20 mg yesterday. - am CBC, BMP - non weight bearing  - PT/OT - recommending CIR; 3 in 1 bedside commode. CIR consult placed.   DM2, stable: well-controlled on metformin 500mg  qdaily at home. Last A1c 7.2 in January.  - hold metformin - sSSI - monitor CBGs - restart metformin - d/c insulin  Alcohol use disorder, improved: patient states he drinks 8 beers daily.  Most recent CIWAs 0. - monitor CIWA w/ ativan PRN - folate, thiamine, multivitamin  HLD, stable: pravastatin 10mg  at home - continue home med  Gout - no clear documtation of gout in records. Pt not sure if taking.  - hold home allopurinol  GERD - pt takes 10mg  prilosec at home - protonix 40mg  qdaily  BPH: takes flomax 0.4mg  at bedtime and finasteride 5mg  at home.  -Continue home meds  Electrolytes: none Nutrition: carb modified GI ppx: protonix DVT px: lovenox 40  Disposition: Patient is medically cleared for CIR  Subjective:  Patient resting in bed, no nasal cannula in  place, breathing comfortably. States pain is well controlled.  Is nervous about his elderly mother at home but recognizes the necessity for rehab.  No concerns or complaints at this time.  Objective: Temp:  [98.2 F (36.8 C)-98.7 F (37.1 C)] 98.2 F (36.8 C) (03/08 0602) Pulse Rate:  [72-99] 89 (03/08 0602) Resp:  [16-20] 16 (03/08 0602) BP: (123-150)/(70-95) 149/91 (03/08 0602) SpO2:  [87 %-95 %] 95 % (03/08 0602) Physical Exam: General: No apparent distress Cardiovascular: RRR, S1-S2 present, no murmurs, rubs, gallops Respiratory: Distant breath sounds secondary to body habitus, clear to auscultation bilaterally Abdomen: Obese abdomen, nontender, bowel sounds 4 quadrants Extremities: Right lower extremity wrapped in Ace bandage with brace holding knee in extension, patient has full range of motion in his right ankle, is able to wiggle toes in bilateral feet, bruising to plantar aspect of right foot slowly improving.  Laboratory: Recent Labs  Lab 10/01/18 0228 10/02/18 0354 10/03/18 0555  WBC 7.8 7.6 5.6  HGB 10.0* 9.9* 10.7*  HCT 33.6* 33.4* 35.9*  PLT 147* 152 149*   Recent Labs  Lab 10/01/18 0228 10/02/18 0354 10/03/18 0555  NA 134* 133* 134*  K 4.4 4.6 4.4  CL 100 97* 101  CO2 28 27 27   BUN 11 13 13   CREATININE 0.99 0.95 0.83  CALCIUM 8.8*  8.7 8.9 9.1  GLUCOSE 136* 124* 113*   PTH: Normal at 24 Calcium: Normal at 8.7 TSH: Normal at 0.794 Testosterone: Low at 119 Free testosterone: Low at 1.9 Sex hormone binding: 39.9 Vitamin D: Normal at 34.8 Synovial  fluid right knee culture: Few WBC present, predominantly PMNs, no organisms seen, no growth at 2 days  Imaging/Diagnostic Tests: No new imaging  [name], DO 10/03/2018, 9:50 AM PGY-1, [place] Intern pager: [phone], text pages welcome

## 2018-10-03 NOTE — Progress Notes (Signed)
SPORTS MEDICINE AND JOINT REPLACEMENT  Lara Mulch, MD    Carlyon Shadow, PA-C [place], [place], [place]  [address]                             [phone]   PROGRESS NOTE  Subjective:  negative for Chest Pain  negative for Shortness of Breath  negative for Nausea/Vomiting   negative for Calf Pain  negative for Bowel Movement   Tolerating Diet: yes         Patient reports pain as 3 on 0-10 scale.    Objective: Vital signs in last 24 hours:    Patient Vitals for the past 24 hrs:  BP Temp Temp src Pulse Resp SpO2  10/03/18 0602 (!) 149/91 98.2 F (36.8 C) Oral 89 16 95 %  10/02/18 2204 (!) 144/90 98.7 F (37.1 C) Oral 72 18 95 %  10/02/18 1457 123/70 98.3 F (36.8 C) Oral 96 16 91 %  10/02/18 1207 (!) 136/95 - - 99 - (!) 87 %  10/02/18 0951 (!) 150/82 98.2 F (36.8 C) Oral 77 20 92 %    @flow {1959:LAST@   Intake/Output from previous day:   03/07 0701 - 03/08 0700 In: 480 [P.O.:480] Out: 600 [Urine:600]   Intake/Output this shift:   No intake/output data recorded.   Intake/Output      03/07 0701 - 03/08 0700 03/08 0701 - 03/09 0700   P.O. 480    Total Intake(mL/kg) 480 (3.3)    Urine (mL/kg/hr) 600 (0.2)    Drains 0    Total Output 600    Net -120         Urine Occurrence 3 x       LABORATORY DATA: Recent Labs    09/29/18 1946 09/30/18 0310 10/01/18 0228 10/02/18 0354 10/03/18 0555  WBC 8.9 6.9 7.8 7.6 5.6  HGB 11.6* 10.7* 10.0* 9.9* 10.7*  HCT 37.6* 34.8* 33.6* 33.4* 35.9*  PLT 166 158 147* 152 149*   Recent Labs    09/29/18 1946 09/30/18 0310 10/01/18 0228 10/02/18 0354 10/03/18 0555  NA 131* 132* 134* 133* 134*  K 4.3 4.5 4.4 4.6 4.4  CL 99 100 100 97* 101  CO2 23 25 28 27 27   BUN 11 10 11 13 13   CREATININE 0.86 0.94 0.99 0.95 0.83  GLUCOSE 118* 120* 136* 124* 113*  CALCIUM 9.3 9.1 8.8*  8.7 8.9 9.1   No results found for: INR, PROTIME  Examination:  General appearance: alert, cooperative and no  distress Extremities: extremities normal, atraumatic, no cyanosis or edema  Wound Exam: clean, dry, intact   Drainage:  None: wound tissue dry  Motor Exam: Quadriceps and Hamstrings Intact  Sensory Exam: Superficial Peroneal, Deep Peroneal and Tibial normal   Assessment:    3 Days Post-Op  Procedure(s) (LRB): PATELLA TENDON REPAIR (Right) Application Of Wound Vac (Right)  ADDITIONAL DIAGNOSIS:  Principal Problem:   Patella fracture, extensor mechanism disruption R knee  Active Problems:   CAD (coronary artery disease)   DM (diabetes mellitus), type 2 (HCC)   Obesity BMI over 52     Plan: Physical Therapy as ordered Weight Bearing as Tolerated (WBAT)  DVT Prophylaxis:  SQ heparin  DISCHARGE PLAN: CIR  Patient doing well, IM in the room at time of evaluation. Awaiting CIR placement  Donia Ast 10/03/2018, 9:07 AM

## 2018-10-04 ENCOUNTER — Encounter (HOSPITAL_COMMUNITY): Payer: Self-pay | Admitting: Orthopedic Surgery

## 2018-10-04 DIAGNOSIS — T148XXA Other injury of unspecified body region, initial encounter: Secondary | ICD-10-CM

## 2018-10-04 LAB — BASIC METABOLIC PANEL
Anion gap: 8 (ref 5–15)
BUN: 10 mg/dL (ref 8–23)
CO2: 28 mmol/L (ref 22–32)
Calcium: 9 mg/dL (ref 8.9–10.3)
Chloride: 97 mmol/L — ABNORMAL LOW (ref 98–111)
Creatinine, Ser: 0.79 mg/dL (ref 0.61–1.24)
GFR calc Af Amer: >60 mL/min (ref >60)
GFR calc non Af Amer: >60 mL/min (ref >60)
Glucose, Bld: 124 mg/dL — ABNORMAL HIGH (ref 70–99)
Potassium: 4.4 mmol/L (ref 3.5–5.1)
Sodium: 133 mmol/L — ABNORMAL LOW (ref 135–145)

## 2018-10-04 LAB — CBC
HCT: 34.9 % — ABNORMAL LOW (ref 39.0–52.0)
Hemoglobin: 10.8 g/dL — ABNORMAL LOW (ref 13.0–17.0)
MCH: 28.3 pg (ref 26.0–34.0)
MCHC: 30.9 g/dL (ref 30.0–36.0)
MCV: 91.4 fL (ref 80.0–100.0)
Platelets: 166 10*3/uL (ref 150–400)
RBC: 3.82 MIL/uL — ABNORMAL LOW (ref 4.22–5.81)
RDW: 17.9 % — ABNORMAL HIGH (ref 11.5–15.5)
WBC: 6 10*3/uL (ref 4.0–10.5)
nRBC: 0 % (ref 0.0–0.2)

## 2018-10-04 LAB — CALCITRIOL (1,25 DI-OH VIT D): Vit D, 1,25-Dihydroxy: 39.3 pg/mL (ref 19.9–79.3)

## 2018-10-04 LAB — GLUCOSE, CAPILLARY
Glucose-Capillary: 105 mg/dL — ABNORMAL HIGH (ref 70–99)
Glucose-Capillary: 116 mg/dL — ABNORMAL HIGH (ref 70–99)
Glucose-Capillary: 94 mg/dL (ref 70–99)
Glucose-Capillary: 124 mg/dL — ABNORMAL HIGH (ref 70–99)

## 2018-10-04 NOTE — Progress Notes (Signed)
Family Medicine Teaching Service Daily Progress Note Intern Pager: [phone]  Patient name: Brandon [name] Medical record number: [id number] Date of birth: Oct 05, 1945 Age: 73 y.o. Gender: male  Primary Care Provider: Helane Rima, MD Consultants: ortho Code Status: DNR  Pt Overview and Major Events to Date:  09/29/2018: Admitted 09/30/2018: Patellar repair surgery  Assessment and Plan: Brandon [name] is a 73 y.o. male who presented with leg pain and found to have right first and second toe fractures and right patellar fracture. PMHx includes MI, DM2, HTN, HLD, gout, GERD, BPH, alcohol abuse, and morbid obesity.  R patellar avulsion fracture s/p surgical repair, improving, day 4 s/p surgery: patient had mechanical fall in which he fractured his right patella.  - Ortho recommending lovenox at discharge for 3 weeks due to increased risk for VTE.   - pain control: tylenol 650 scheduled and oxycodone 5-10mg  PRN.  Received total of oxycodone 20 mg yesterday. Plan to d/c to CIR on 3 more days of Oxy 5mg  q6 PRN and Tylenol 650 q6hrs. - am CBC, BMP - non weight bearing  - PT/OT - recommending CIR; 3 in 1 bedside commode. CIR consult placed.   DM2, stable: well-controlled on metformin 500mg  qdaily at home. Last A1c 7.2 in January.  - hold metformin - sSSI - monitor CBGs - restart metformin  Alcohol use disorder, improved: patient states he drinks 8 beers daily.  Most recent CIWAs 0. - monitor CIWA w/ ativan PRN - folate, thiamine, multivitamin  HLD, stable: pravastatin 10mg  at home - continue home med  Gout: no clear documtation of gout in records. Pt not sure if taking.  - hold home allopurinol  GERD: pt takes 10mg  prilosec at home - protonix 40mg  qdaily  BPH: takes flomax 0.4mg  at bedtime and finasteride 5mg  at home.  -Continue home meds  Electrolytes: none Nutrition: carb modified GI ppx: protonix DVT px: lovenox 40  Disposition: Patient is medically cleared for  CIR  Subjective:  Patient resting in bed breathing comfortably. States pain is well controlled. No concerns or complaints at this time.  Objective: Temp:  [98.1 F (36.7 C)-98.5 F (36.9 C)] 98.1 F (36.7 C) (03/09 0844) Pulse Rate:  [87-101] 94 (03/09 0844) Resp:  [17-18] 18 (03/08 2049) BP: (100-159)/(62-96) 159/88 (03/09 0844) SpO2:  [94 %-97 %] 97 % (03/09 0844) Physical Exam: General: No apparent distress Cardiovascular: RRR, S1-S2 present, no murmurs, rubs, gallops Respiratory: Distant breath sounds secondary to body habitus, clear to auscultation bilaterally Abdomen: Obese abdomen, nontender, bowel sounds 4 quadrants Extremities: Right lower extremity wrapped in Ace bandage with brace holding knee in extension, patient has full range of motion in his right ankle, is able to wiggle toes in bilateral feet, bruising to plantar aspect of right foot slowly improving.  Laboratory: Recent Labs  Lab 10/02/18 0354 10/03/18 0555 10/04/18 0331  WBC 7.6 5.6 6.0  HGB 9.9* 10.7* 10.8*  HCT 33.4* 35.9* 34.9*  PLT 152 149* 166   Recent Labs  Lab 10/02/18 0354 10/03/18 0555 10/04/18 0331  NA 133* 134* 133*  K 4.6 4.4 4.4  CL 97* 101 97*  CO2 27 27 28   BUN 13 13 10   CREATININE 0.95 0.83 0.79  CALCIUM 8.9 9.1 9.0  GLUCOSE 124* 113* 124*   PTH: Normal at 24 Calcium: Normal at 8.7 TSH: Normal at 0.794 Testosterone: Low at 119 Free testosterone: Low at 1.9 Sex hormone binding: 39.9 Vitamin D: Normal at 34.8 Synovial fluid right knee culture: Few WBC present, predominantly PMNs,  no organisms seen, no growth at 2 days  Imaging/Diagnostic Tests: No new imaging  [name], DO 10/04/2018, 1:16 PM PGY-1, [place] Intern pager: [phone], text pages welcome

## 2018-10-04 NOTE — Progress Notes (Addendum)
Orthopedic Trauma Service Progress Note  Patient ID: Brandon [name] MRN: [id number] DOB/AGE: [birth year] 74 y.o.  Subjective:  Doing well No specific complaints  Agrees that he needs additional therapy   Awaiting insurance auth for CIR    Review of Systems  Constitutional: Negative for chills and fever.  Respiratory: Negative for shortness of breath and wheezing.   Cardiovascular: Negative for chest pain and palpitations.  Gastrointestinal: Negative for nausea and vomiting.    Objective:   VITALS:   Vitals:   10/03/18 1455 10/03/18 2049 10/04/18 0527 10/04/18 0844  BP: 104/69 (!) 140/96 100/62 (!) 159/88  Pulse: (!) 101 94 87 94  Resp: 17 18    Temp: 98.5 F (36.9 C) 98.2 F (36.8 C) 98.4 F (36.9 C) 98.1 F (36.7 C)  TempSrc: Oral Oral Oral Oral  SpO2: 94% 95% 97% 97%  Weight:      Height:        Estimated body mass index is 52.33 kg/m as calculated from the following:   Height as of this encounter: 5' 5.98" (1.676 m).   Weight as of this encounter: 147 kg.   Intake/Output      03/08 0701 - 03/09 0700 03/09 0701 - 03/10 0700   P.O. 360    Total Intake(mL/kg) 360 (2.4)    Urine (mL/kg/hr)     Drains     Total Output     Net +360         Urine Occurrence 6 x      LABS  Results for orders placed or performed during the hospital encounter of 09/29/18 (from the past 24 hour(s))  Glucose, capillary     Status: Abnormal   Collection Time: 10/03/18  4:01 PM  Result Value Ref Range   Glucose-Capillary 181 (H) 70 - 99 mg/dL  Glucose, capillary     Status: Abnormal   Collection Time: 10/03/18 10:27 PM  Result Value Ref Range   Glucose-Capillary 106 (H) 70 - 99 mg/dL  CBC     Status: Abnormal   Collection Time: 10/04/18  3:31 AM  Result Value Ref Range   WBC 6.0 4.0 - 10.5 K/uL   RBC 3.82 (L) 4.22 - 5.81 MIL/uL   Hemoglobin 10.8 (L) 13.0 - 17.0 g/dL   HCT 34.9 (L) 39.0 - 52.0 %   MCV 91.4 80.0 - 100.0 fL   MCH 28.3 26.0 - 34.0 pg   MCHC 30.9 30.0 - 36.0 g/dL   RDW 17.9 (H) 11.5 - 15.5 %   Platelets 166 150 - 400 K/uL   nRBC 0.0 0.0 - 0.2 %  Basic metabolic panel     Status: Abnormal   Collection Time: 10/04/18  3:31 AM  Result Value Ref Range   Sodium 133 (L) 135 - 145 mmol/L   Potassium 4.4 3.5 - 5.1 mmol/L   Chloride 97 (L) 98 - 111 mmol/L   CO2 28 22 - 32 mmol/L   Glucose, Bld 124 (H) 70 - 99 mg/dL   BUN 10 8 - 23 mg/dL   Creatinine, Ser 0.79 0.61 - 1.24 mg/dL   Calcium 9.0 8.9 - 10.3 mg/dL   GFR calc non Af Amer >60 >60 mL/min   GFR calc Af Amer >60 >60 mL/min   Anion gap 8 5 - 15  Glucose, capillary  Status: Abnormal   Collection Time: 10/04/18  8:27 AM  Result Value Ref Range   Glucose-Capillary 105 (H) 70 - 99 mg/dL     PHYSICAL EXAM:   Gen: sitting up in bed, appears comfortable  Ext:       Right Lower Extremity              Dressing c/d/i             Hinged brace fitting well              prevena functioning              Ext is warm              Ecchymosis stable to toes              Motor and sensory functions intact distally              + DP pulse             Compartments are soft                         No pain with passive stretching    Assessment/Plan: 4 Days Post-Op   Principal Problem:   Patella fracture, extensor mechanism disruption R knee  Active Problems:   CAD (coronary artery disease)   DM (diabetes mellitus), type 2 (HCC)   Obesity BMI over 52   Anti-infectives (From admission, onward)   Start     Dose/Rate Route Frequency Ordered Stop   09/30/18 1800  ceFAZolin (ANCEF) IVPB 2g/100 mL premix     2 g 200 mL/hr over 30 Minutes Intravenous Every 6 hours 09/30/18 1440 10/01/18 1159   09/30/18 1130  ceFAZolin (ANCEF) 3 g in dextrose 5 % 50 mL IVPB  Status:  Discontinued     3 g 100 mL/hr over 30 Minutes Intravenous  Once 09/30/18 1115 09/30/18 1440   09/30/18 1116  ceFAZolin (ANCEF) 2-4 GM/100ML-% IVPB      Note to Pharmacy:  Bobbie Stack   : cabinet override      09/30/18 1116 09/30/18 2329    .  POD/HD#: 55  74 y/o male s/p ground level fall with R toe fractures and R knee extensor mechanism disruption    -R knee extensor mechanism disruption s/p repair             WBAT R leg with knee in full extension only with brace on                          Can be in knee immobilizer or in hinged knee brace locked in full extension              Walker to aide with ambulatory function              Dressing change before discharge.  prevena can remain on for 7 days                                                   High risk for complications such as re-rupture given history of infection in that knee, social habits and body habitus              PT/OT  No knee ROM at this time                         Ok to do ankle therex                            Will advance knee ROM in 2 weeks. No active extension for 8 weeks                                     0-30 degrees flexion after post op week 2                                     0-60 degrees flexion after post op week 4                                     0-90 degrees flexion after post op week 5                                     All flexion will be active and passive                                     Only passive extension until week 8                                                   Will likely keep pt braced with WB activity until 12 weeks post op                Ice and elevate for swelling and pain control    - Pain management:             Titrate accordingly              On scheduled tylenol                          Oxy use has been very minimal (about 20 mg q 24 hours for last 48 hours)   - ABL anemia/Hemodynamics             Stable   - Medical issues              Per medical team                Vitamin d levels are fair                          PTH, TSH look good   Significant Testosterone deficiency which  is likely attributable to his chronic EtOH use and obesity    - DVT/PE prophylaxis:             lovenox x 3 weeks    - ID:  periop abx completed    - Metabolic Bone Disease:             as above             Clinically very poor bone quality               Recommend DEXA as an outpt-- additional treatment based off dexa                Continue vitamin d and vitamin c supplementation    Will need to discuss with PCP or Endocrine about possibility for Testosterone replacement.    - Activity:             Up with therapy              As above   - Impediments to fracture healing:             Obesity              Hx of infections process R knee              Chronic EtOH use              DM   Metabolic bone disease---hypogonadism               - Dispo:             Therapy             CIR consult pending-- awaiting word from insurance                Not sure dc home directly from hospital the best decision               Follow up with ortho in 2 weeks    Jari Pigg, PA-C [phone] (C) 10/04/2018, 12:12 PM  [facility] [place] [id number] [phone] Domingo Sep (F)

## 2018-10-04 NOTE — Plan of Care (Signed)
  Problem: Pain Managment: Goal: General experience of comfort will improve Outcome: Progressing   Problem: Skin Integrity: Goal: Risk for impaired skin integrity will decrease Outcome: Progressing   

## 2018-10-04 NOTE — Progress Notes (Signed)
IP rehab admissions - I met with patient at the bedside today.  He is interested in CIR.  I have opened the case and have faxed clinicals to [facility] requesting acute inpatient rehab admission.  I will follow up once I hear back from insurance case manager.  Call me for questions.  [phone]

## 2018-10-04 NOTE — Progress Notes (Signed)
Physical Therapy Treatment Patient Details Name: Brandon [name] MRN: [id number] DOB: [birth year] Today's Date: 10/04/2018    History of Present Illness 73 year old male with past medical history of morbid obesity, diabetes, coronary artery disease who presents emergency department for pain and inability to ambulate.  Patient was seen early this morning in the emergency department after mechanical fall.  He suffered fractures in the right toes.  X-rays of the hips, lumbar spine, ankle were normal.  Patient reports that usually he gets around with a cane or walker at home.  Reports that while he was being assisted back into his vehicle after his emergency department visit this morning he twisted his knee because it was difficult to get in the car.  s/p patellar repair with wound vac and bledsoe brace in place.     PT Comments    Pt was seen for continued mobility and strengthening, and noted his struggle to move RLE with brace.  Assisted to side of bed, and then noted his good effort to help with standing and transition to the chair.  Pt is interested in returning home, and is weak with RLE but working to use RW for support.  Follow on acutely for bettter standing balance, better ability to maneuver walker and safety with use of Bledsoe brace to avoid flexion of leg.   Follow Up Recommendations  CIR     Equipment Recommendations       Recommendations for Other Services Rehab consult     Precautions / Restrictions Precautions Precautions: Fall Precaution Comments: bledsoe brace at all times on RLE Restrictions Weight Bearing Restrictions: Yes RLE Weight Bearing: Weight bearing as tolerated    Mobility  Bed Mobility Overal bed mobility: Needs Assistance Bed Mobility: Supine to Sit     Supine to sit: Min assist     General bed mobility comments: HOB elevated and using bedrail with help for RLE  Transfers Overall transfer level: Needs assistance Equipment used: Rolling walker (2  wheeled) Transfers: Sit to/from Stand Sit to Stand: Mod assist         General transfer comment: cued use of brace to sit  Ambulation/Gait Ambulation/Gait assistance: Min assist Gait Distance (Feet): 6 Feet Assistive device: Rolling walker (2 wheeled) Gait Pattern/deviations: Step-to pattern;Decreased stride length;Decreased stance time - right;Decreased weight shift to right;Wide base of support Gait velocity: reduced Gait velocity interpretation: <1.8 ft/sec, indicate of risk for recurrent falls General Gait Details: improved control of brace once steadied to stand   Stairs             Wheelchair Mobility    Modified Rankin (Stroke Patients Only)       Balance Overall balance assessment: History of Falls;Needs assistance Sitting-balance support: Feet supported Sitting balance-Leahy Scale: Good     Standing balance support: Bilateral upper extremity supported;During functional activity Standing balance-Leahy Scale: Poor Standing balance comment: requires RW to steady balance                            Cognition Arousal/Alertness: Awake/alert Behavior During Therapy: WFL for tasks assessed/performed Overall Cognitive Status: Within Functional Limits for tasks assessed                                        Exercises      General Comments General comments (skin integrity, edema, etc.): pt was able to  control RLE in brace to carefully take steps despite pain      Pertinent Vitals/Pain Pain Assessment: 0-10 Pain Score: 6  Pain Location: R LE Pain Descriptors / Indicators: Operative site guarding Pain Intervention(s): Monitored during session;Premedicated before session;Repositioned    Home Living                      Prior Function            PT Goals (current goals can now be found in the care plan section) Acute Rehab PT Goals Patient Stated Goal: to walk and assist to get home Progress towards PT goals:  Progressing toward goals    Frequency    Min 3X/week      PT Plan Current plan remains appropriate    Co-evaluation              AM-PAC PT "6 Clicks" Mobility   Outcome Measure  Help needed turning from your back to your side while in a flat bed without using bedrails?: A Little Help needed moving from lying on your back to sitting on the side of a flat bed without using bedrails?: A Little Help needed moving to and from a bed to a chair (including a wheelchair)?: A Little Help needed standing up from a chair using your arms (e.g., wheelchair or bedside chair)?: A Little Help needed to walk in hospital room?: A Little Help needed climbing 3-5 steps with a railing? : A Lot 6 Click Score: 17    End of Session Equipment Utilized During Treatment: Gait belt Activity Tolerance: Patient limited by fatigue;Treatment limited secondary to medical complications (Comment) Patient left: in chair;with call bell/phone within reach;with chair alarm set Nurse Communication: Mobility status;Precautions;Other (comment)(noted O2 sats 94% with mobility off cannula) PT Visit Diagnosis: Unsteadiness on feet (R26.81);Other abnormalities of gait and mobility (R26.89);Muscle weakness (generalized) (M62.81);History of falling (Z91.81);Difficulty in walking, not elsewhere classified (R26.2);Other symptoms and signs involving the nervous system (R29.898);Pain Pain - Right/Left: Right Pain - part of body: Knee     Time: 4801-6553 PT Time Calculation (min) (ACUTE ONLY): 23 min  Charges:  $Gait Training: 8-22 mins $Therapeutic Activity: 8-22 mins                    [name] 10/04/2018, 8:39 PM   Mee Hives, PT MS Acute Rehab Dept. Number: [place]

## 2018-10-05 LAB — AEROBIC/ANAEROBIC CULTURE W GRAM STAIN (SURGICAL/DEEP WOUND): Culture: NO GROWTH

## 2018-10-05 LAB — BASIC METABOLIC PANEL
Anion gap: 11 (ref 5–15)
BUN: 13 mg/dL (ref 8–23)
CO2: 24 mmol/L (ref 22–32)
Calcium: 9.3 mg/dL (ref 8.9–10.3)
Chloride: 98 mmol/L (ref 98–111)
Creatinine, Ser: 0.79 mg/dL (ref 0.61–1.24)
GFR calc Af Amer: >60 mL/min (ref >60)
GFR calc non Af Amer: >60 mL/min (ref >60)
Glucose, Bld: 107 mg/dL — ABNORMAL HIGH (ref 70–99)
Potassium: 4.2 mmol/L (ref 3.5–5.1)
Sodium: 133 mmol/L — ABNORMAL LOW (ref 135–145)

## 2018-10-05 LAB — CBC
HCT: 37.8 % — ABNORMAL LOW (ref 39.0–52.0)
Hemoglobin: 11.5 g/dL — ABNORMAL LOW (ref 13.0–17.0)
MCH: 27.3 pg (ref 26.0–34.0)
MCHC: 30.4 g/dL (ref 30.0–36.0)
MCV: 89.6 fL (ref 80.0–100.0)
Platelets: 186 10*3/uL (ref 150–400)
RBC: 4.22 MIL/uL (ref 4.22–5.81)
RDW: 17.6 % — ABNORMAL HIGH (ref 11.5–15.5)
WBC: 6.1 10*3/uL (ref 4.0–10.5)
nRBC: 0 % (ref 0.0–0.2)

## 2018-10-05 LAB — GLUCOSE, CAPILLARY
Glucose-Capillary: 101 mg/dL — ABNORMAL HIGH (ref 70–99)
Glucose-Capillary: 95 mg/dL (ref 70–99)
Glucose-Capillary: 95 mg/dL (ref 70–99)
Glucose-Capillary: 97 mg/dL (ref 70–99)

## 2018-10-05 LAB — TESTOSTERONE, % FREE: Testosterone-% Free: 1.3 % — ABNORMAL HIGH (ref 0.2–0.7)

## 2018-10-05 MED ORDER — SENNOSIDES-DOCUSATE SODIUM 8.6-50 MG PO TABS
2.0000 | ORAL_TABLET | Freq: Two times a day (BID) | ORAL | Status: DC
  Administered 2018-10-05 – 2018-10-07 (×4): 2 via ORAL
  Filled 2018-10-05 (×5): qty 2

## 2018-10-05 MED ORDER — POLYETHYLENE GLYCOL 3350 17 G PO PACK
17.0000 g | PACK | Freq: Two times a day (BID) | ORAL | Status: DC
  Administered 2018-10-05 – 2018-10-07 (×4): 17 g via ORAL
  Filled 2018-10-05 (×5): qty 1

## 2018-10-05 NOTE — Progress Notes (Signed)
Occupational Therapy Treatment Patient Details Name: Brandon [name] MRN: [id number] DOB: 12-25-1945 Today's Date: 10/05/2018    History of present illness 73 year old male with past medical history of morbid obesity, diabetes, coronary artery disease who presents emergency department for pain and inability to ambulate.  Patient was seen early this morning in the emergency department after mechanical fall.  He suffered fractures in the right toes.  X-rays of the hips, lumbar spine, ankle were normal.  Patient reports that usually he gets around with a cane or walker at home.  Reports that while he was being assisted back into his vehicle after his emergency department visit this morning he twisted his knee because it was difficult to get in the car.  s/p patellar repair with wound vac and bledsoe brace in place.    OT comments  Pt agreeable to SNF at this time. Pt eager to d/c to next location to progress toward home. Recommend RW stand pivot only with staff toward L side.  Follow Up Recommendations  SNF    Equipment Recommendations  3 in 1 bedside commode;Other (comment)(bariatric RW)    Recommendations for Other Services      Precautions / Restrictions Precautions Precautions: Fall Precaution Comments: bledsoe brace at all times on RLE Restrictions Weight Bearing Restrictions: Yes RLE Weight Bearing: Weight bearing as tolerated       Mobility Bed Mobility Overal bed mobility: Needs Assistance Bed Mobility: Supine to Sit Rolling: Supervision   Supine to sit: Min guard     General bed mobility comments: heavy use of bed rail  Transfers Overall transfer level: Needs assistance Equipment used: 4-wheeled walker Transfers: Sit to/from [facility] Sit to Stand: Min assist Stand pivot transfers: Min assist;From elevated surface       General transfer comment: cues for hand placement. pt pulling up on RW    Balance                                           ADL either performed or assessed with clinical judgement   ADL Overall ADL's : Needs assistance/impaired                         Toilet Transfer: Minimal assistance;RW   Toileting- Clothing Manipulation and Hygiene: Total assistance         General ADL Comments: pt agreeable to OOb this session.      Vision       Perception     Praxis      Cognition Arousal/Alertness: Awake/alert Behavior During Therapy: WFL for tasks assessed/performed Overall Cognitive Status: Within Functional Limits for tasks assessed                                          Exercises     Shoulder Instructions       General Comments wound vac in place    Pertinent Vitals/ Pain       Pain Assessment: Faces Faces Pain Scale: Hurts little more Pain Location: R LE Pain Descriptors / Indicators: Operative site guarding Pain Intervention(s): Monitored during session;Repositioned  Home Living  Prior Functioning/Environment              Frequency  Min 2X/week        Progress Toward Goals  OT Goals(current goals can now be found in the care plan section)  Progress towards OT goals: Progressing toward goals  Acute Rehab OT Goals Patient Stated Goal: to walk and assist to get home OT Goal Formulation: With patient Time For Goal Achievement: 10/15/18 Potential to Achieve Goals: Good ADL Goals Pt Will Perform Grooming: with min assist;sitting Pt Will Perform Upper Body Dressing: with min guard assist;sitting Pt Will Perform Lower Body Dressing: with mod assist;sit to/from stand;with adaptive equipment Pt Will Transfer to Toilet: with mod assist;bedside commode;stand pivot transfer Additional ADL Goal #1: Pt will complete bed mobility S (A) with HOB 30 degrees or less as precursor to adls.  Plan Discharge plan needs to be updated    Co-evaluation                 AM-PAC OT  "6 Clicks" Daily Activity     Outcome Measure   Help from another person eating meals?: None Help from another person taking care of personal grooming?: A Little Help from another person toileting, which includes using toliet, bedpan, or urinal?: A Lot Help from another person bathing (including washing, rinsing, drying)?: A Lot Help from another person to put on and taking off regular upper body clothing?: A Little Help from another person to put on and taking off regular lower body clothing?: A Lot 6 Click Score: 16    End of Session Equipment Utilized During Treatment: Gait belt;Rolling walker  OT Visit Diagnosis: Unsteadiness on feet (R26.81);Muscle weakness (generalized) (M62.81)   Activity Tolerance Patient tolerated treatment well   Patient Left in chair;with call bell/phone within reach;with chair alarm set   Nurse Communication Mobility status;Precautions;Weight bearing status        Time: 8891-6945 OT Time Calculation (min): 23 min  Charges: OT General Charges $OT Visit: 1 Visit OT Treatments $Self Care/Home Management : 8-22 mins   Jeri Modena, OTR/L  Acute Rehabilitation Services Pager: [phone] Office: [phone] .    Jeri Modena 10/05/2018, 5:00 PM

## 2018-10-05 NOTE — Plan of Care (Signed)
  Problem: Pain Managment: Goal: General experience of comfort will improve Outcome: Progressing   

## 2018-10-05 NOTE — Care Management Important Message (Signed)
Important Message  Patient Details  Name: Brandon [name] MRN: [id number] Date of Birth: April 28, 1946   Medicare Important Message Given:  Yes    Orbie Pyo 10/05/2018, 4:17 PM

## 2018-10-05 NOTE — Progress Notes (Signed)
Family Medicine Teaching Service Daily Progress Note Intern Pager: [phone]  Patient name: Brandon [name] Medical record number: [id number] Date of birth: 06/26/1946 Age: 73 y.o. Gender: male  Primary Care Provider: Helane Rima, MD Consultants: ortho Code Status: DNR  Pt Overview and Major Events to Date:  09/29/2018: Admitted 09/30/2018: Patellar repair surgery  Assessment and Plan: Brandon [name] is a 73 y.o. male who presented with leg pain and found to have right first and second toe fractures and right patellar fracture. PMHx includes MI, DM2, HTN, HLD, gout, GERD, BPH, alcohol abuse, and morbid obesity.  R patellar avulsion fracture s/p surgical repair, improving, day 4 s/p surgery: patient had mechanical fall in which he fractured his right patella.  - Ortho recommending lovenox at discharge for 3 weeks due to increased risk for VTE.   - pain control: tylenol 650 scheduled and oxycodone 5-10mg  PRN. Received total of oxycodone 20 mg yesterday. Plan to d/c to SNF on 3 more days of Oxy 5mg  q6 PRN and Tylenol 650 q6hrs. - am CBC, BMP - non weight bearing  - PT/OT - recommending SNF; 3 in 1 bedside commode. CIR consult placed.   DM2, stable: well-controlled on metformin 500mg  qdaily at home. Last A1c 7.2 in January.  - sSSI - monitor CBGs - restart metformin  Alcohol use disorder, improved: patient states he drinks 8 beers daily.  Most recent CIWAs 0. - monitor CIWA w/ ativan PRN - folate, thiamine, multivitamin  HLD, stable: pravastatin 10mg  at home - continue home med  Gout: no clear documentation of gout in records. Pt not sure if taking.  - holding home allopurinol  GERD: pt takes 10mg  prilosec at home - protonix 40mg  qdaily  BPH: takes flomax 0.4mg  at bedtime and finasteride 5mg  at home.  -Continue home meds  Electrolytes: none Nutrition: carb modified GI ppx: protonix DVT px: lovenox 40  Disposition: Patient is medically cleared for SNF.  Subjective:   Patient resting comfortably, is disappointed CIR will not workout but is still desiring SNF. He is moving his right toes and ankle well, no concerns with pain at this time. Very pleasant patient.  Objective: Temp:  [98.1 F (36.7 C)-98.8 F (37.1 C)] 98.6 F (37 C) (03/10 1323) Pulse Rate:  [80-100] 80 (03/10 1323) Resp:  [16-20] 16 (03/10 0841) BP: (134-160)/(62-103) 157/85 (03/10 1323) SpO2:  [96 %-98 %] 97 % (03/10 1323) Physical Exam: General: No apparent distress Cardiovascular: RRR, S1-S2 present, no murmurs, rubs, gallops Respiratory: Distant breath sounds secondary to body habitus, clear to auscultation bilaterally Abdomen: Obese abdomen, nontender, bowel sounds 4 quadrants Extremities: Right lower extremity wrapped in Ace bandage with brace holding knee in extension, patient has full range of motion in his right ankle, is able to wiggle toes in bilateral feet, bruising to plantar aspect of right foot continues to improve.  Laboratory: Recent Labs  Lab 10/03/18 0555 10/04/18 0331 10/05/18 0240  WBC 5.6 6.0 6.1  HGB 10.7* 10.8* 11.5*  HCT 35.9* 34.9* 37.8*  PLT 149* 166 186   Recent Labs  Lab 10/03/18 0555 10/04/18 0331 10/05/18 0240  NA 134* 133* 133*  K 4.4 4.4 4.2  CL 101 97* 98  CO2 27 28 24   BUN 13 10 13   CREATININE 0.83 0.79 0.79  CALCIUM 9.1 9.0 9.3  GLUCOSE 113* 124* 107*   PTH: Normal at 24 Calcium: Normal at 8.7 TSH: Normal at 0.794 Testosterone: Low at 119 Free testosterone: Low at 1.9 Sex hormone binding: 39.9 Vitamin D: Normal  at 34.8 Synovial fluid right knee culture: Few WBC present, predominantly PMNs, no organisms seen, no growth at 2 days  Imaging/Diagnostic Tests: No new imaging  [name], DO 10/05/2018, 2:44 PM PGY-1, [place] Intern pager: [phone], text pages welcome

## 2018-10-05 NOTE — Progress Notes (Signed)
IP rehab admissions - I have received a denial from insurance carrier for inpatient rehab stay.  Rehab MD did a peer to peer review with medical director, but denial was upheld.  Options will now be SNF or home with [facility] therapies.  Call me for questions.  [phone]

## 2018-10-06 DIAGNOSIS — S92911A Unspecified fracture of right toe(s), initial encounter for closed fracture: Secondary | ICD-10-CM | POA: Diagnosis present

## 2018-10-06 LAB — GLUCOSE, CAPILLARY
Glucose-Capillary: 102 mg/dL — ABNORMAL HIGH (ref 70–99)
Glucose-Capillary: 104 mg/dL — ABNORMAL HIGH (ref 70–99)

## 2018-10-06 MED ORDER — SODIUM CHLORIDE 0.9 % IV SOLN: INTRAVENOUS | Status: DC

## 2018-10-06 NOTE — NC FL2 (Deleted)
[place] LEVEL OF CARE SCREENING TOOL     IDENTIFICATION  Patient Name: Brandon [name] Birthdate: 09-26-1945 Sex: male Admission Date (Current Location): 09/29/2018  [facility] and [place] Number:  Herbalist and Address:  [facility] [place], [place] [address] [place], [place] [address]      Provider Number: [id number]  Attending Physician Name and Address:  Dickie La, MD  Relative Name and Phone Number:  Pandora LeiterMendy Lapinsky [phone])    Current Level of Care: Hospital Recommended Level of Care: [place] Prior Approval Number:    Date Approved/Denied:   PASRR Number: [id number]  Discharge Plan: SNF    Current Diagnoses: Patient Active Problem List   Diagnosis Date Noted  . Closed displaced fracture of phalanx of toe of right foot 10/06/2018  . 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 Patella fracture, extensor mechanism disruption R knee  09/29/2018  . Hyponatremia 08/23/2018  . Obstipation 08/23/2018  . Colon cancer ([place]) 08/23/2018  . CAD (coronary artery disease) 08/23/2018  . DM (diabetes mellitus), type 2 ([place]) 08/23/2018  . Pyuria 08/23/2018    Orientation RESPIRATION BLADDER Height & Weight     Self, Time, Situation, Place  Normal   Weight: (!) 147 kg Height:  5' 5.98" (167.6 cm)  BEHAVIORAL SYMPTOMS/MOOD NEUROLOGICAL BOWEL NUTRITION STATUS        Diet  AMBULATORY STATUS COMMUNICATION OF NEEDS Skin   Extensive Assist Verbally Surgical wounds                       Personal Care Assistance Level of Assistance  Dressing, Bathing Bathing Assistance: Maximum assistance Feeding assistance: Limited assistance Dressing Assistance: Maximum assistance     Functional Limitations Info             SPECIAL CARE FACTORS FREQUENCY                       Contractures Contractures Info: Not present    Additional Factors Info                  Current Medications (10/06/2018):  This is  the current hospital active medication list Current Facility-Administered Medications  Medication Dose Route Frequency Provider Last Rate Last Dose  . acetaminophen (TYLENOL) tablet 650 mg  650 mg Oral Q6H Benay Pike, MD   650 mg at 10/06/18 1309  . amLODipine (NORVASC) tablet 10 mg  10 mg Oral Daily Ainsley Spinner, PA-C   10 mg at 10/06/18 0850  . cholecalciferol (VITAMIN D3) tablet 2,000 Units  2,000 Units Oral BID Ainsley Spinner, PA-C   2,000 Units at 10/06/18 0849  . enoxaparin (LOVENOX) injection 40 mg  40 mg Subcutaneous Q24H Benay Pike, MD   40 mg at 10/06/18 0851  . finasteride (PROSCAR) tablet 5 mg  5 mg Oral Daily Benay Pike, MD   5 mg at 10/06/18 0848  . folic acid (FOLVITE) tablet 1 mg  1 mg Oral Daily Ainsley Spinner, PA-C   1 mg at 10/06/18 0851  . hydrOXYzine (ATARAX/VISTARIL) tablet 25 mg  25 mg Oral TID PRN Matilde Haymaker, MD   25 mg at 10/05/18 1717  . lactated ringers infusion   Intravenous Continuous Ainsley Spinner, PA-C 10 mL/hr at 09/30/18 1116    . metFORMIN (GLUCOPHAGE) tablet 500 mg  500 mg Oral Q breakfast Benay Pike, MD   500 mg at 10/06/18 0849  .  multivitamin with minerals tablet 1 tablet  1 tablet Oral Daily Ainsley Spinner, PA-C   1 tablet at 10/06/18 0848  . ondansetron (ZOFRAN) tablet 4 mg  4 mg Oral Q6H PRN Ainsley Spinner, PA-C       Or  . ondansetron [facility]) injection 4 mg  4 mg Intravenous Q6H PRN Ainsley Spinner, PA-C      . oxyCODONE (Oxy IR/ROXICODONE) immediate release tablet 5-10 mg  5-10 mg Oral Q6H PRN Benay Pike, MD   10 mg at 10/06/18 0850  . pantoprazole (PROTONIX) EC tablet 40 mg  40 mg Oral Daily Ainsley Spinner, PA-C   40 mg at 10/06/18 0850  . polyethylene glycol (MIRALAX / GLYCOLAX) packet 17 g  17 g Oral BID Benay Pike, MD   17 g at 10/06/18 [phone]  . pravastatin (PRAVACHOL) tablet 10 mg  10 mg Oral QHS Ainsley Spinner, PA-C   10 mg at 10/05/18 2153  . senna-docusate (Senokot-S) tablet 2 tablet  2 tablet Oral BID Benay Pike, MD   2 tablet at  10/06/18 0850  . sodium chloride flush (NS) 0.9 % injection 3 mL  3 mL Intravenous PRN Ainsley Spinner, PA-C   3 mL at 10/02/18 2208  . tamsulosin (FLOMAX) capsule 0.4 mg  0.4 mg Oral Daily Benay Pike, MD   0.4 mg at 10/06/18 0848  . thiamine (VITAMIN B-1) tablet 100 mg  100 mg Oral Daily Ainsley Spinner, PA-C   100 mg at 10/06/18 0848  . vitamin C (ASCORBIC ACID) tablet 1,000 mg  1,000 mg Oral Daily Ainsley Spinner, PA-C   1,000 mg at 10/06/18 2774     Discharge Medications: Please see discharge summary for a list of discharge medications.  Relevant Imaging Results:  Relevant Lab Results:   Additional Information    Ninfa Meeker, RN

## 2018-10-06 NOTE — TOC Initial Note (Signed)
Transition of Care [facility]) - Initial/Assessment Note    Patient Details  Name: Brandon [name] MRN: [id number] Date of Birth: 03/25/1946  [facility] of [facility] CM/SW Contact:    Alberteen Sam, LCSW Phone Number: 10/06/2018, 3:45 PM  Clinical Narrative:       CSW consulted with patient regarding SNF recommendation. Patient reports his son [name] is making the decision of where he will go. CSW contacted [name] who reports preference for [facility]. CSW sent referral to [facility] and explained insurance authorization process to [place].  [name] reports patient lives at home with is 3 year old mother who is unable to care for him. [name] inquired regarding ALFs at discharge from SNF, [place] encouraged this if son believes patient can no longer care for himself at home. CSW will continue to update patient's son on dc process and insurance authorization.    Expected Discharge Plan: Skilled Nursing Facility Barriers to Discharge: Continued Medical Work up   Patient Goals and CMS Choice Patient states their goals for this hospitalization and ongoing recovery are:: Patient reports he wants to go home CMS Medicare.gov Compare Post Acute Care list provided to:: Patient Represenative (must comment)(son [name]) Choice offered to / list presented to : Adult Children  Expected Discharge Plan and Services Expected Discharge Plan: [place] Acute Care Choice: [place] arrangements for the past 2 months: Single Family Home                 DME Arranged: N/A DME Agency: NA HH Arranged: NA HH Agency: NA  Prior Living Arrangements/Services Living arrangements for the past 2 months: [place] Lives with:: Spouse Patient language and need for interpreter reviewed:: No Do you feel safe going back to the place where you live?: No   Patient lives at home with 73 year old mother  Need for Family Participation in Patient Care: Yes (Comment) Care giver  support system in place?: Yes (comment)   Criminal Activity/Legal Involvement Pertinent to Current Situation/Hospitalization: No - Comment as needed  Activities of Daily Living Home Assistive Devices/Equipment: Cane (specify quad or straight), CBG Meter, Dentures (specify type), Eyeglasses, Walker (specify type), Wheelchair ADL Screening (condition at time of admission) Patient's cognitive ability adequate to safely complete daily activities?: Yes Is the patient deaf or have difficulty hearing?: No Does the patient have difficulty seeing, even when wearing glasses/contacts?: No Does the patient have difficulty concentrating, remembering, or making decisions?: No Patient able to express need for assistance with ADLs?: Yes Does the patient have difficulty dressing or bathing?: No Independently performs ADLs?: Yes (appropriate for developmental age) Does the patient have difficulty walking or climbing stairs?: Yes Weakness of Legs: Right Weakness of Arms/Hands: None  Permission Sought/Granted Permission sought to share information with : Case Manager, Customer service manager, Family Supports Permission granted to share information with : Yes, Verbal Permission Granted  Share Information with NAME: [name]  Permission granted to share info w AGENCY: SNFs  Permission granted to share info w Relationship: son  Permission granted to share info w Contact Information: [phone]  Emotional Assessment Appearance:: Appears stated age Attitude/Demeanor/Rapport: Gracious Affect (typically observed): Accepting Orientation: : Oriented to Self, Oriented to Place, Oriented to  Time, Oriented to Situation Alcohol / Substance Use: Not Applicable Psych Involvement: No (comment)  Admission diagnosis:  Accidental fall, initial encounter [W19.XXXA] Unable to ambulate [R26.2] Avulsion of right patellar tendon, initial encounter [H06.237S] Patient Active Problem List   Diagnosis Date  Noted  .  Closed displaced fracture of phalanx of toe of right foot 10/06/2018  . 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 Patella fracture, extensor mechanism disruption R knee  09/29/2018  . Hyponatremia 08/23/2018  . Obstipation 08/23/2018  . Colon cancer ([place]) 08/23/2018  . CAD (coronary artery disease) 08/23/2018  . DM (diabetes mellitus), type 2 ([place]) 08/23/2018  . Pyuria 08/23/2018   PCP:  Helane Rima, MD Pharmacy:   [place] [place] [address] [place] [address] Phone: [phone] Fax: [phone] - [place], [place] - [address] AT [place] [address] [place] [address] Phone: [phone] Fax: [phone]     Social Determinants of Health (SDOH) Interventions    Readmission Risk Interventions 30 Day Unplanned Readmission Risk Score     ED to Hosp-Admission (Current) from 09/29/2018 in [place]  30 Day Unplanned Readmission Risk Score (%)  17 Filed at 10/06/2018 1200     This score is the patient's risk of an unplanned readmission within 30 days of being discharged (0 -100%). The score is based on dignosis, age, lab data, medications, orders, and past utilization.   Low:  0-14.9   Medium: 15-21.9   High: 22-29.9   Extreme: 30 and above       No flowsheet data found.

## 2018-10-06 NOTE — Progress Notes (Addendum)
Physical Therapy Treatment Patient Details Name: Brandon [name] MRN: [id number] DOB: [birth year] Today's Date: 10/06/2018    History of Present Illness 73 year old male with past medical history of morbid obesity, diabetes, coronary artery disease who presents emergency department for pain and inability to ambulate.  Patient was seen early this morning in the emergency department after mechanical fall.  He suffered fractures in the right toes.  X-rays of the hips, lumbar spine, ankle were normal.  Patient reports that usually he gets around with a cane or walker at home.  Reports that while he was being assisted back into his vehicle after his emergency department visit this morning he twisted his knee because it was difficult to get in the car.  s/p patellar repair with wound vac and bledsoe brace in place.      PT Comments    Patient seen for mobility progression. Pt requires mod A for sit to stand transfers from elevated bed height and max A to attempt stand form lower surface. Pt is able to ambulate 3 ft this session before feeling fatigued. Continue to progress as tolerated with anticipated d/c to SNF for further skilled PT services.     Follow Up Recommendations  SNF     Equipment Recommendations  Other (comment)(bariatric RW )    Recommendations for Other Services       Precautions / Restrictions Precautions Precautions: Fall Precaution Comments: no R knee flexion or active extension Required Braces or Orthoses: Other Brace(Bledsoe brace at all times locked in extension) Restrictions Weight Bearing Restrictions: Yes RLE Weight Bearing: Weight bearing as tolerated    Mobility  Bed Mobility Overal bed mobility: Needs Assistance Bed Mobility: Supine to Sit     Supine to sit: Min assist     General bed mobility comments: min A to bring R LE to EOB and use of rails   Transfers Overall transfer level: Needs assistance Equipment used: Rolling walker (2 wheeled) Transfers:  Sit to/from [facility] Sit to Stand: Mod assist;From elevated surface         General transfer comment: cues for safe hand placement and assistance required to power up into standing; pt able to stand with mod A from elevated bed height and unable to get into standing from recliner with max A +1; pt became anxious with attempt to stand from recliner due to fear of falling     Ambulation/Gait Ambulation/Gait assistance: Min assist Gait Distance (Feet): 3 Feet Assistive device: Rolling walker (2 wheeled) Gait Pattern/deviations: Step-to pattern;Decreased stride length;Decreased stance time - right;Decreased weight shift to right;Wide base of support;Trunk flexed Gait velocity: reduced   General Gait Details: cues for posture, safe proximity to RW, and sequencing; pt took steps to recliner   Stairs             Wheelchair Mobility    Modified Rankin (Stroke Patients Only)       Balance Overall balance assessment: History of Falls;Needs assistance Sitting-balance support: Feet supported Sitting balance-Leahy Scale: Good     Standing balance support: Bilateral upper extremity supported;During functional activity Standing balance-Leahy Scale: Poor Standing balance comment: requires RW to steady balance                            Cognition Arousal/Alertness: Awake/alert Behavior During Therapy: WFL for tasks assessed/performed Overall Cognitive Status: Within Functional Limits for tasks assessed  General Comments: fear of falling      Exercises      General Comments General comments (skin integrity, edema, etc.): pt c/o lightheadedness during session BP in sitting 110/74 and HR 94 bpm       Pertinent Vitals/Pain Pain Assessment: Faces Faces Pain Scale: Hurts even more Pain Location: R LE with weight bearing  Pain Descriptors / Indicators: Grimacing;Guarding Pain Intervention(s): Limited  activity within patient's tolerance;Monitored during session;Repositioned;Premedicated before session    Home Living                      Prior Function            PT Goals (current goals can now be found in the care plan section) Progress towards PT goals: Progressing toward goals    Frequency    Min 3X/week      PT Plan Discharge plan needs to be updated    Co-evaluation              AM-PAC PT "6 Clicks" Mobility   Outcome Measure  Help needed turning from your back to your side while in a flat bed without using bedrails?: A Little Help needed moving from lying on your back to sitting on the side of a flat bed without using bedrails?: A Little Help needed moving to and from a bed to a chair (including a wheelchair)?: A Lot Help needed standing up from a chair using your arms (e.g., wheelchair or bedside chair)?: A Lot Help needed to walk in hospital room?: A Little Help needed climbing 3-5 steps with a railing? : A Lot 6 Click Score: 15    End of Session Equipment Utilized During Treatment: Gait belt Activity Tolerance: Patient tolerated treatment well Patient left: in chair;with call bell/phone within reach Nurse Communication: Mobility status;Precautions(pt will need +2 to stand or to lateral scoot from drop arm) PT Visit Diagnosis: Unsteadiness on feet (R26.81);Other abnormalities of gait and mobility (R26.89);Muscle weakness (generalized) (M62.81);History of falling (Z91.81);Difficulty in walking, not elsewhere classified (R26.2);Other symptoms and signs involving the nervous system (R29.898);Pain Pain - Right/Left: Right Pain - part of body: Knee     Time: 1400-1431 PT Time Calculation (min) (ACUTE ONLY): 31 min  Charges:  $Gait Training: 23-37 mins                     Earney Navy, PTA Acute Rehabilitation Services Pager: [phone] Office: [phone]     Darliss Cheney 10/06/2018, 2:52 PM

## 2018-10-06 NOTE — Progress Notes (Addendum)
CSW now consulting with patient since CIR was denied as of yesterday 10/05/2018. CSW will not consult while CIR insurance Josem Kaufmann is processed since insurance will deny CIR if insurance is also in process for SNF, please advise.   CSW consulted with patient regarding SNF as dc plan, patient reports being saddened by CIR denial. He states his son [name] is working on finding a place. CSW inquired as to Stephen's cell number, patient reports his number is in the chart.   CSW attempted to call [name] went to vm and vm is full. CSW told patient this, patient stated his phone blocks unknown numbers.   CSW will continue to attempt to reach patient's son [name] for SNF choice.   Please contact CSW if family in room. Thank you.   [place], [place]

## 2018-10-06 NOTE — Care Management (Addendum)
Patient information faxed out to SNF's, will await bed offers and patient's son selection of facility.    Ricki Miller, RN BSN Case Management [phone]

## 2018-10-06 NOTE — Progress Notes (Addendum)
Orthopedic Trauma Service Progress Note  Patient ID: Brandon [name] MRN: [id number] DOB/AGE: 1945-08-05 73 y.o.  Subjective:  Doing fine  No acute issues  SW consult placed on 3/7, I do not see clinical note from SW. Will re-order as pt was denied by insurance for CIR and would like to go to a SNF a this point   ROS As above  Objective:   VITALS:   Vitals:   10/05/18 0841 10/05/18 1323 10/05/18 2022 10/06/18 0404  BP: (!) 160/103 (!) 157/85 (!) 141/84 (!) 155/96  Pulse: 95 80 79 74  Resp: 16  14 14   Temp: 98.1 F (36.7 C) 98.6 F (37 C) 98.3 F (36.8 C) 98.2 F (36.8 C)  TempSrc: Oral Oral Oral Oral  SpO2: 98% 97% 94% (!) 87%  Weight:      Height:        Estimated body mass index is 52.33 kg/m as calculated from the following:   Height as of this encounter: 5' 5.98" (1.676 m).   Weight as of this encounter: 147 kg.   Intake/Output      03/10 0701 - 03/11 0700 03/11 0701 - 03/12 0700   P.O. 720    Total Intake(mL/kg) 720 (4.9)    Urine (mL/kg/hr) 575 (0.2)    Drains 0    Total Output 575    Net +145           LABS  Results for orders placed or performed during the hospital encounter of 09/29/18 (from the past 24 hour(s))  Glucose, capillary     Status: None   Collection Time: 10/05/18 11:54 AM  Result Value Ref Range   Glucose-Capillary 95 70 - 99 mg/dL  Glucose, capillary     Status: None   Collection Time: 10/05/18  4:31 PM  Result Value Ref Range   Glucose-Capillary 95 70 - 99 mg/dL  Glucose, capillary     Status: None   Collection Time: 10/05/18  9:23 PM  Result Value Ref Range   Glucose-Capillary 97 70 - 99 mg/dL     PHYSICAL EXAM:   Gen: in bed, NAD, appears well Ext:       Right Lower Extremity   Hinged knee brace fitting appropriately   Dressing and Prevena removed  Incision looks great   No signs of infection     No erythema or drainage  Swelling very well  controlled to R leg  Ecchymosis improving to R foot   Motor and sensroy functions unchanged and intact   Assessment/Plan: 6 Days Post-Op   Principal Problem:   Patella fracture, extensor mechanism disruption R knee  Active Problems:   CAD (coronary artery disease)   DM (diabetes mellitus), type 2 (HCC)   Obesity BMI over 52   Anti-infectives (From admission, onward)   Start     Dose/Rate Route Frequency Ordered Stop   09/30/18 1800  ceFAZolin (ANCEF) IVPB 2g/100 mL premix     2 g 200 mL/hr over 30 Minutes Intravenous Every 6 hours 09/30/18 1440 10/01/18 1159   09/30/18 1130  ceFAZolin (ANCEF) 3 g in dextrose 5 % 50 mL IVPB  Status:  Discontinued     3 g 100 mL/hr over 30 Minutes Intravenous  Once 09/30/18 1115 09/30/18 1440   09/30/18 1116  ceFAZolin (ANCEF) 2-4 GM/100ML-%  IVPB    Note to Pharmacy:  Bobbie Stack   : cabinet override      09/30/18 1116 09/30/18 2329    .  POD/HD#: 11  74 y/o male s/p ground level fall with R toe fractures and R knee extensor mechanism disruption    -R knee extensor mechanism disruption s/p repair             WBAT R leg with knee in full extension only with brace on                          Can be in knee immobilizer or in hinged knee brace locked in full extension              Walker to aide with ambulatory function              Dressing changed this am   prevena removed   Aquacel Ag dressing    Hinged brace maintained   Will order knee high TED hose as well for swelling control                  High risk for complications such as re-rupture given history of infection in that knee, social habits and body habitus              PT/OT                         No knee ROM at this time                         Ok to do ankle therex                            Will advance knee ROM in 2 weeks. No active extension for 8 weeks                                     0-30 degrees flexion after post op week 2                                     0-60  degrees flexion after post op week 4                                     0-90 degrees flexion after post op week 5                                     All flexion will be active and passive                                     Only passive extension until week 8                                        Will likely keep pt braced with WB activity  until 12 weeks post op                Ice and elevate for swelling and pain control   -R foot phalanx fractures  WBAT R leg as aboe  Post op shoe for comfort when WB    - Pain management:             Titrate accordingly              On scheduled tylenol                          Oxy use has been very minimal (about 20 mg q 24 hours for last 48 hours)   - ABL anemia/Hemodynamics             Stable   - Medical issues              Per medical team                Vitamin d levels are fair    Supplement vitamin d                          PTH, TSH look good                         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    - Activity:             Up with therapy              As above   - Impediments to fracture healing:             Obesity              Hx of infections process R knee              Chronic EtOH use              DM              Metabolic bone disease---hypogonadism    - Dispo:            SW consult for SNF  Ortho issues stable  Follow up with ortho in 10 days   Will advance his ROM activities at that time       Jari Pigg, PA-C [phone] (C) 10/06/2018, 8:42 AM  [facility] [place]  [place] [address] [phone] (O) [phone] (F)

## 2018-10-06 NOTE — Progress Notes (Signed)
Family Medicine Teaching Service Daily Progress Note Intern Pager: [phone]  Patient name: Brandon [name] Medical record number: [id number] Date of birth: 1945/09/04 Age: 73 y.o. Gender: male  Primary Care Provider: Helane Rima, MD Consultants: ortho Code Status: DNR  Pt Overview and Major Events to Date:  09/29/2018: Admitted 09/30/2018: Patellar repair surgery  Assessment and Plan: Brandon [name] is a 73 y.o. male who presented with leg pain and found to have right first and second toe fractures and right patellar fracture. PMHx includes MI, DM2, HTN, HLD, gout, GERD, BPH, alcohol abuse, and morbid obesity.  R patellar avulsion fracture s/p surgical repair, improving, day 6 s/p surgery: patient had mechanical fall in which he fractured his right patella.  - Ortho recommending lovenox at discharge for 3 weeks due to increased risk for VTE.   - pain control: tylenol 650 scheduled and oxycodone 5-10mg  PRN. Received total of oxycodone 20 mg yesterday. Plan to d/c to SNF on 3 more days of Oxy 5mg  q6 PRN and Tylenol 650 q6hrs. - am CBC, BMP - non weight bearing  - PT/OT - recommending SNF  DM2, stable: well-controlled on metformin 500mg  qdaily at home. Last A1c 7.2 in January.  - sSSI - monitor CBGs - restart metformin  Alcohol use disorder, improved: patient states he drinks 8 beers daily.  Most recent CIWAs 0. - monitor CIWA w/ ativan PRN - folate, thiamine, multivitamin  HLD, stable: pravastatin 10mg  at home - continue home med  Gout: no clear documentation of gout in records. Pt not sure if taking.  - holding home allopurinol  GERD: pt takes 10mg  prilosec at home - protonix 40mg  qdaily  BPH: takes flomax 0.4mg  at bedtime and finasteride 5mg  at home.  -Continue home meds  Electrolytes: none Nutrition: carb modified GI ppx: protonix DVT px: lovenox 40  Disposition: Patient is medically cleared for SNF.  Subjective:  Patient still awaiting SNF.  No concerns this  morning.  Objective: Temp:  [98.2 F (36.8 C)-98.6 F (37 C)] 98.2 F (36.8 C) (03/11 0404) Pulse Rate:  [74-80] 74 (03/11 0404) Resp:  [14] 14 (03/11 0404) BP: (141-157)/(84-96) 155/96 (03/11 0404) SpO2:  [87 %-97 %] 87 % (03/11 0404) Physical Exam: General: No apparent distress Cardiovascular: RRR, S1-S2 present, no murmurs, rubs, gallops Respiratory: Distant breath sounds secondary to body habitus, clear to auscultation bilaterally Abdomen: Obese abdomen, nontender, bowel sounds 4 quadrants Extremities: Right lower extremity wrapped in Ace bandage with brace holding knee in extension, patient has full range of motion in his right ankle, is able to wiggle toes in bilateral feet, bruising to plantar aspect of right foot continues to improve.  Laboratory: Recent Labs  Lab 10/03/18 0555 10/04/18 0331 10/05/18 0240  WBC 5.6 6.0 6.1  HGB 10.7* 10.8* 11.5*  HCT 35.9* 34.9* 37.8*  PLT 149* 166 186   Recent Labs  Lab 10/03/18 0555 10/04/18 0331 10/05/18 0240  NA 134* 133* 133*  K 4.4 4.4 4.2  CL 101 97* 98  CO2 27 28 24   BUN 13 10 13   CREATININE 0.83 0.79 0.79  CALCIUM 9.1 9.0 9.3  GLUCOSE 113* 124* 107*   PTH: Normal at 24 Calcium: Normal at 8.7 TSH: Normal at 0.794 Testosterone: Low at 119 Free testosterone: Low at 1.9 Sex hormone binding: 39.9 Vitamin D: Normal at 34.8 Synovial fluid right knee culture: Few WBC present, predominantly PMNs, no organisms seen, no growth at 2 days  Imaging/Diagnostic Tests: No new imaging  [name], DO 10/06/2018, 9:43  AM PGY-1, [place] Intern pager: [phone], text pages welcome

## 2018-10-06 NOTE — NC FL2 (Signed)
[place] LEVEL OF CARE SCREENING TOOL     IDENTIFICATION  Patient Name: Brandon [name] Birthdate: February 09, 1946 Sex: male Admission Date (Current Location): 09/29/2018  [facility] and [place] Number:  Herbalist and Address:  [facility] [place], [place] [address], [place], [place] [address]      Provider Number: [id number]  Attending Physician Name and Address:  Dickie La, MD  Relative Name and Phone Number:  Pandora LeiterKiyon Fidalgo [phone])    Current Level of Care: Hospital Recommended Level of Care: [place] Prior Approval Number:    Date Approved/Denied:   PASRR Number: [id number]  Discharge Plan: SNF    Current Diagnoses: Patient Active Problem List   Diagnosis Date Noted  . Closed displaced fracture of phalanx of toe of right foot 10/06/2018  . 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 Patella fracture, extensor mechanism disruption R knee  09/29/2018  . Hyponatremia 08/23/2018  . Obstipation 08/23/2018  . Colon cancer ([place]) 08/23/2018  . CAD (coronary artery disease) 08/23/2018  . DM (diabetes mellitus), type 2 ([place]) 08/23/2018  . Pyuria 08/23/2018    Orientation RESPIRATION BLADDER Height & Weight     Self, Time, Situation, Place  Normal Incontinent Weight: (!) 324 lb 1.2 oz (147 kg) Height:  5' 5.98" (167.6 cm)  BEHAVIORAL SYMPTOMS/MOOD NEUROLOGICAL BOWEL NUTRITION STATUS      Continent Diet  AMBULATORY STATUS COMMUNICATION OF NEEDS Skin   Extensive Assist Verbally Surgical wounds                       Personal Care Assistance Level of Assistance  Dressing, Bathing Bathing Assistance: Maximum assistance Feeding assistance: Limited assistance Dressing Assistance: Maximum assistance     Functional Limitations Info  Sight, Hearing, Speech Sight Info: Impaired Hearing Info: Adequate Speech Info: Adequate    SPECIAL CARE FACTORS FREQUENCY  PT (By licensed PT), OT (By licensed OT)     PT  Frequency: min 5x weekly OT Frequency: min 5x weekly            Contractures Contractures Info: Not present    Additional Factors Info  Code Status, Allergies Code Status Info: DNR Allergies Info: no known allergies           Current Medications (10/06/2018):  This is the current hospital active medication list Current Facility-Administered Medications  Medication Dose Route Frequency Provider Last Rate Last Dose  . acetaminophen (TYLENOL) tablet 650 mg  650 mg Oral Q6H Benay Pike, MD   650 mg at 10/06/18 1309  . amLODipine (NORVASC) tablet 10 mg  10 mg Oral Daily Ainsley Spinner, PA-C   10 mg at 10/06/18 0850  . cholecalciferol (VITAMIN D3) tablet 2,000 Units  2,000 Units Oral BID Ainsley Spinner, PA-C   2,000 Units at 10/06/18 0849  . enoxaparin (LOVENOX) injection 40 mg  40 mg Subcutaneous Q24H Benay Pike, MD   40 mg at 10/06/18 0851  . finasteride (PROSCAR) tablet 5 mg  5 mg Oral Daily Benay Pike, MD   5 mg at 10/06/18 0848  . folic acid (FOLVITE) tablet 1 mg  1 mg Oral Daily Ainsley Spinner, PA-C   1 mg at 10/06/18 0851  . hydrOXYzine (ATARAX/VISTARIL) tablet 25 mg  25 mg Oral TID PRN Matilde Haymaker, MD   25 mg at 10/05/18 1717  . lactated ringers infusion   Intravenous Continuous Ainsley Spinner, PA-C 10 mL/hr at 09/30/18 1116    .  metFORMIN (GLUCOPHAGE) tablet 500 mg  500 mg Oral Q breakfast Benay Pike, MD   500 mg at 10/06/18 0849  . multivitamin with minerals tablet 1 tablet  1 tablet Oral Daily Ainsley Spinner, PA-C   1 tablet at 10/06/18 0848  . ondansetron (ZOFRAN) tablet 4 mg  4 mg Oral Q6H PRN Ainsley Spinner, PA-C       Or  . ondansetron [facility]) injection 4 mg  4 mg Intravenous Q6H PRN Ainsley Spinner, PA-C      . oxyCODONE (Oxy IR/ROXICODONE) immediate release tablet 5-10 mg  5-10 mg Oral Q6H PRN Benay Pike, MD   10 mg at 10/06/18 0850  . pantoprazole (PROTONIX) EC tablet 40 mg  40 mg Oral Daily Ainsley Spinner, PA-C   40 mg at 10/06/18 0850  . polyethylene glycol (MIRALAX  / GLYCOLAX) packet 17 g  17 g Oral BID Benay Pike, MD   17 g at 10/06/18 [phone]  . pravastatin (PRAVACHOL) tablet 10 mg  10 mg Oral QHS Ainsley Spinner, PA-C   10 mg at 10/05/18 2153  . senna-docusate (Senokot-S) tablet 2 tablet  2 tablet Oral BID Benay Pike, MD   2 tablet at 10/06/18 0850  . sodium chloride flush (NS) 0.9 % injection 3 mL  3 mL Intravenous PRN Ainsley Spinner, PA-C   3 mL at 10/02/18 2208  . tamsulosin (FLOMAX) capsule 0.4 mg  0.4 mg Oral Daily Benay Pike, MD   0.4 mg at 10/06/18 0848  . thiamine (VITAMIN B-1) tablet 100 mg  100 mg Oral Daily Ainsley Spinner, PA-C   100 mg at 10/06/18 0848  . vitamin C (ASCORBIC ACID) tablet 1,000 mg  1,000 mg Oral Daily Ainsley Spinner, PA-C   1,000 mg at 10/06/18 6808     Discharge Medications: Please see discharge summary for a list of discharge medications.  Relevant Imaging Results:  Relevant Lab Results:   Additional Information SSN: [id number]  Alberteen Sam, LCSW

## 2018-10-06 NOTE — Plan of Care (Signed)
  Problem: Elimination: Goal: Will not experience complications related to bowel motility Outcome: Progressing Goal: Will not experience complications related to urinary retention Outcome: Progressing   Problem: Pain Managment: Goal: General experience of comfort will improve Outcome: Progressing   Problem: Skin Integrity: Goal: Risk for impaired skin integrity will decrease Outcome: Progressing   

## 2018-10-06 NOTE — Discharge Instructions (Signed)
Orthopaedic Trauma Service Discharge Instructions   General Discharge Instructions  WEIGHT BEARING STATUS: Weightbearing as tolerated Right Leg with knee in full extension (either hinged knee brace locked in full extension or knee immobilizer   RANGE OF MOTION/ACTIVITY: No knee range of motion at this time.  Pt is Post op day 6 as of 10/06/2018.  Will not start R knee ROM until Post op week 2.  Activity as tolerated otherwise.  Ok to do ankle Therex   Will advance knee ROM in 2 weeks. No active extension for 8 weeks 0-30 degrees flexion after post op week 2 0-60 degrees flexion after post op week 4 0-90 degrees flexion after post op week 5 All flexion will be active and passive Only passive extension until week 8   Will likely keep pt braced with WB activity until 12 weeks post op   Wound Care: daily wound care starting on arrival to facility.  Ok to shower and clean wound with soap and water only. No ointments or solutions to wound   [place]  Discharge Wound Care Instructions  Do NOT apply any ointments, solutions or lotions to pin sites or surgical wounds.  These prevent needed drainage and even though solutions like hydrogen peroxide kill bacteria, they also damage cells lining the pin sites that help fight infection.  Applying lotions or ointments can keep the wounds moist and can cause them to breakdown and open up as well. This can increase the risk for infection. When in doubt call the office.  Surgical incisions should be dressed daily.  If any drainage is noted, use one layer of adaptic, then gauze, Kerlix, and an ace wrap.  Once the incision is completely dry and without drainage, it may be left open to air out.  Showering may begin  36-48 hours later.  Cleaning gently with soap and water.  Traumatic wounds should be dressed daily as well.    One layer of adaptic, gauze, Kerlix, then ace wrap.  The adaptic can be discontinued once the draining has ceased    If you have a wet to dry dressing: wet the gauze with saline the squeeze as much saline out so the gauze is moist (not soaking wet), place moistened gauze over wound, then place a dry gauze over the moist one, followed by Kerlix wrap, then ace wrap.      DVT/PE prophylaxis: Lovenox x 14 days  Diet: as you were eating previously.  Can use over the counter stool softeners and bowel preparations, such as Miralax, to help with bowel movements.  Narcotics can be constipating.  Be sure to drink plenty of fluids  PAIN MEDICATION USE AND EXPECTATIONS  You have likely been given narcotic medications to help control your pain.  After a traumatic event that results in an fracture (broken bone) with or without surgery, it is ok to use narcotic pain medications to help control one's pain.  We understand that everyone responds to pain differently and each individual patient will be evaluated on a regular basis for the continued need for narcotic medications. Ideally, narcotic medication use should last no more than 6-8 weeks (coinciding with fracture healing).   As a patient it is your responsibility as well to monitor narcotic medication use and report the amount and frequency you use these medications when you come to your office visit.   We would also advise that if you are using narcotic medications, you should take a dose prior to therapy  to maximize you participation.  IF YOU ARE ON NARCOTIC MEDICATIONS IT IS NOT PERMISSIBLE TO OPERATE A MOTOR VEHICLE (MOTORCYCLE/CAR/TRUCK/MOPED) OR HEAVY MACHINERY DO NOT MIX NARCOTICS WITH OTHER CNS (CENTRAL NERVOUS SYSTEM) DEPRESSANTS SUCH AS ALCOHOL   STOP SMOKING OR USING NICOTINE PRODUCTS!!!!  As discussed nicotine severely impairs your  body's ability to heal surgical and traumatic wounds but also impairs bone healing.  Wounds and bone heal by forming microscopic blood vessels (angiogenesis) and nicotine is a vasoconstrictor (essentially, shrinks blood vessels).  Therefore, if vasoconstriction occurs to these microscopic blood vessels they essentially disappear and are unable to deliver necessary nutrients to the healing tissue.  This is one modifiable factor that you can do to dramatically increase your chances of healing your injury.    (This means no smoking, no nicotine gum, patches, etc)  DO NOT USE NONSTEROIDAL ANTI-INFLAMMATORY DRUGS (NSAID'S)  Using products such as Advil (ibuprofen), Aleve (naproxen), Motrin (ibuprofen) for additional pain control during fracture healing can delay and/or prevent the healing response.  If you would like to take over the counter (OTC) medication, Tylenol (acetaminophen) is ok.  However, some narcotic medications that are given for pain control contain acetaminophen as well. Therefore, you should not exceed more than 4000 mg of tylenol in a day if you do not have liver disease.  Also note that there are may OTC medicines, such as cold medicines and allergy medicines that my contain tylenol as well.  If you have any questions about medications and/or interactions please ask your doctor/PA or your pharmacist.      ICE AND ELEVATE INJURED/OPERATIVE EXTREMITY  Using ice and elevating the injured extremity above your heart can help with swelling and pain control.  Icing in a pulsatile fashion, such as 20 minutes on and 20 minutes off, can be followed.    Do not place ice directly on skin. Make sure there is a barrier between to skin and the ice pack.    Using frozen items such as frozen peas works well as the conform nicely to the are that needs to be iced.  USE AN ACE WRAP OR TED HOSE FOR SWELLING CONTROL  In addition to icing and elevation, Ace wraps or TED hose are used to help limit and resolve  swelling.  It is recommended to use Ace wraps or TED hose until you are informed to stop.    When using Ace Wraps start the wrapping distally (farthest away from the body) and wrap proximally (closer to the body)   Example: If you had surgery on your leg or thing and you do not have a splint on, start the ace wrap at the toes and work your way up to the thigh        If you had surgery on your upper extremity and do not have a splint on, start the ace wrap at your fingers and work your way up to the upper arm  IF YOU ARE IN A SPLINT OR CAST DO NOT [place]   If your splint gets wet for any reason please contact the office immediately. You may shower in your splint or cast as long as you keep it dry.  This can be done by wrapping in a cast cover or garbage back (or similar)  Do Not stick any thing down your splint or cast such as pencils, money, or hangers to try and scratch yourself with.  If you feel itchy take benadryl as prescribed on the  bottle for itching  IF YOU ARE IN A CAM BOOT (BLACK BOOT)  You may remove boot periodically. Perform daily dressing changes as noted below.  Wash the liner of the boot regularly and wear a sock when wearing the boot. It is recommended that you sleep in the boot until told otherwise  CALL THE OFFICE WITH ANY QUESTIONS OR CONCERNS: [phone]

## 2018-10-07 DIAGNOSIS — S86891A Other injury of other muscle(s) and tendon(s) at lower leg level, right leg, initial encounter: Secondary | ICD-10-CM

## 2018-10-07 LAB — GLUCOSE, CAPILLARY
Glucose-Capillary: 111 mg/dL — ABNORMAL HIGH (ref 70–99)
Glucose-Capillary: 97 mg/dL (ref 70–99)

## 2018-10-07 MED ORDER — ENOXAPARIN SODIUM 80 MG/0.8ML ~~LOC~~ SOLN
70.0000 mg | SUBCUTANEOUS | Status: DC
  Administered 2018-10-08: 70 mg via SUBCUTANEOUS
  Filled 2018-10-07: qty 0.8

## 2018-10-07 NOTE — Plan of Care (Signed)
  Problem: Education: Goal: Knowledge of General Education information will improve Description Including pain rating scale, medication(s)/side effects and non-pharmacologic comfort measures Outcome: Progressing   Problem: Health Behavior/Discharge Planning: Goal: Ability to manage health-related needs will improve Outcome: Progressing   

## 2018-10-07 NOTE — Progress Notes (Signed)
Family Medicine Teaching Service Daily Progress Note Intern Pager: [phone]  Patient name: Dailey Buccheri Medical record number: [id number] Date of birth: 1946-01-24 Age: 73 y.o. Gender: male  Primary Care Provider: Helane Rima, MD Consultants: Ortho Code Status: DNR  Pt Overview and Major Events to Date:  09/29/2018: Admitted 09/30/2018: Patellar repair surgery  Assessment and Plan: Naheim Burgen is a 73 y.o. male who presented with leg pain and found to have right first and second toe fractures and right patellar fracture. PMHx includes MI, DM2, HTN, HLD, gout, GERD, BPH, alcohol abuse, and morbid obesity.  R patellar avulsion fracture s/p surgical repair, improving, day 6 s/p surgery: patient had mechanical fall in which he fractured his right patella.  - Ortho recommending lovenox at discharge for 3 weeks due to increased risk for VTE.   - pain control: tylenol 650 scheduled and oxycodone 5-10mg  PRN. Received total of oxycodone 20 mg yesterday. Plan to d/c to SNF on 3 more days of Oxy 5mg  q6 PRN and Tylenol 650 q6hrs. - am CBC, BMP - non weight bearing  - PT/OT - recommending SNF  DM2, stable: well-controlled on metformin 500mg  qdaily at home. Last A1c 7.2 in January.  - Metformin daily with breakfast  Alcohol use disorder, improved: patient states he drinks 8 beers daily.  Most recent CIWAs 0. - monitor CIWA w/ ativan PRN - folate, thiamine, multivitamin  HLD, stable: pravastatin 10mg  at home - continue home med  GERD: pt takes 10mg  prilosec at home - protonix 40mg  qdaily  BPH: takes flomax 0.4mg  at bedtime and finasteride 5mg  at home.  -Continue home meds  Electrolytes: none Nutrition: carb modified GI ppx: protonix DVT px: lovenox 40  Disposition: Patient is medically cleared for SNF. Preference is [facility].  Subjective:  Patient still awaiting SNF.  No concerns this morning.  Objective: Temp:  [98 F (36.7 C)-98.7 F (37.1 C)] 98.1 F (36.7 C) (03/12  1439) Pulse Rate:  [64-89] 89 (03/12 1439) Resp:  [14-16] 16 (03/12 1439) BP: (81-159)/(47-99) 116/73 (03/12 1439) SpO2:  [94 %-97 %] 95 % (03/12 1439) Physical Exam: General: No apparent distress Cardiovascular: RRR, S1-S2 present, no murmurs, rubs, gallops Respiratory: Distant breath sounds secondary to body habitus, clear to auscultation bilaterally Abdomen: Obese abdomen, nontender, bowel sounds 4 quadrants Extremities: Right lower extremity wrapped in Ace bandage with brace holding knee in extension, patient has full range of motion in his right ankle, is able to wiggle toes in bilateral feet  Laboratory: Recent Labs  Lab 10/03/18 0555 10/04/18 0331 10/05/18 0240  WBC 5.6 6.0 6.1  HGB 10.7* 10.8* 11.5*  HCT 35.9* 34.9* 37.8*  PLT 149* 166 186   Recent Labs  Lab 10/03/18 0555 10/04/18 0331 10/05/18 0240  NA 134* 133* 133*  K 4.4 4.4 4.2  CL 101 97* 98  CO2 27 28 24   BUN 13 10 13   CREATININE 0.83 0.79 0.79  CALCIUM 9.1 9.0 9.3  GLUCOSE 113* 124* 107*   PTH: Normal at 24 Calcium: Normal at 8.7 TSH: Normal at 0.794 Testosterone: Low at 119 Free testosterone: Low at 1.9 Sex hormone binding: 39.9 Vitamin D: Normal at 34.8 Synovial fluid right knee culture: Few WBC present, predominantly PMNs, no organisms seen, no growth at 2 days  Imaging/Diagnostic Tests: No new imaging  [name], DO 10/07/2018, 2:44 PM PGY-1, [place] Intern pager: [phone], text pages welcome

## 2018-10-07 NOTE — Progress Notes (Signed)
[facility] preference declined due to no bariatric beds, son [name] prefers [facility] now.   [facility] accepts patient and has started insurance auth with [facility] on this day.   [place], [place]

## 2018-10-08 MED ORDER — VITAMIN D 25 MCG (1000 UNIT) PO TABS: 2000.0000 [IU] | ORAL_TABLET | Freq: Two times a day (BID) | ORAL | 0 refills | Status: DC

## 2018-10-08 MED ORDER — THIAMINE HCL 100 MG PO TABS: 100.0000 mg | ORAL_TABLET | Freq: Every day | ORAL | 0 refills | Status: DC

## 2018-10-08 MED ORDER — FOLIC ACID 1 MG PO TABS: 1.0000 mg | ORAL_TABLET | Freq: Every day | ORAL | 0 refills | Status: DC

## 2018-10-08 MED ORDER — POLYETHYLENE GLYCOL 3350 17 G PO PACK: 17.0000 g | PACK | Freq: Two times a day (BID) | ORAL | 0 refills | Status: DC

## 2018-10-08 MED ORDER — OXYCODONE HCL 5 MG PO TABS: 5.0000 mg | ORAL_TABLET | Freq: Three times a day (TID) | ORAL | 0 refills | Status: DC | PRN

## 2018-10-08 MED ORDER — ENOXAPARIN SODIUM 80 MG/0.8ML ~~LOC~~ SOLN: 70.0000 mg | SUBCUTANEOUS | 0 refills | Status: DC

## 2018-10-08 MED ORDER — SENNOSIDES-DOCUSATE SODIUM 8.6-50 MG PO TABS: 1.0000 | ORAL_TABLET | Freq: Two times a day (BID) | ORAL | 0 refills | Status: DC

## 2018-10-08 MED ORDER — ASCORBIC ACID 1000 MG PO TABS: 1000.0000 mg | ORAL_TABLET | Freq: Every day | ORAL | 0 refills | Status: DC

## 2018-10-08 MED ORDER — OXYCODONE HCL 5 MG PO TABS: 5.0000 mg | ORAL_TABLET | Freq: Three times a day (TID) | ORAL | 0 refills | Status: AC | PRN

## 2018-10-08 NOTE — Progress Notes (Signed)
PT Cancellation Note  Patient Details Name: Brandon [name] MRN: [id number] DOB: 1946-07-21   Cancelled Treatment:    Reason Eval/Treat Not Completed: Patient declined, no reason specified Patient declined participating in PT at this time and reports he will be discharging to SNF in an hour.   [place] April, PTA Acute Rehabilitation Services Pager: [phone] Office: [phone]   10/08/2018, 3:45 PM

## 2018-10-08 NOTE — Care Management Important Message (Signed)
Important Message  Patient Details  Name: Brandon [name] MRN: [id number] Date of Birth: 1945/10/30   Medicare Important Message Given:  Yes    Orbie Pyo 10/08/2018, 3:09 PM

## 2018-10-08 NOTE — Progress Notes (Signed)
Family Medicine Teaching Service Daily Progress Note Intern Pager: [phone]  Patient name: Brandon [name] Medical record number: [id number] Date of birth: 04/03/1946 Age: 73 y.o. Gender: male  Primary Care Provider: Helane Rima, MD Consultants: Ortho Code Status: DNR  Pt Overview and Major Events to Date:  09/29/2018: Admitted 09/30/2018: Patellar repair surgery  Assessment and Plan: Brandon [name] is a 73 y.o. male who presented with leg pain and found to have right first and second toe fractures and right patellar fracture. PMHx includes MI, DM2, HTN, HLD, gout, GERD, BPH, alcohol abuse, and morbid obesity.  R patellar avulsion fracture s/p surgical repair, improving: patient had mechanical fall in which he fractured his right patella.  - Ortho recommending lovenox at discharge for 3 weeks due to increased risk for VTE.   - pain control: tylenol 650 scheduled and oxycodone 5-10mg  PRN. Received total of oxycodone 20 mg yesterday. Plan to d/c to SNF on 3 more days of Oxy 5mg  q6 PRN and Tylenol 650 q6hrs. - am CBC, BMP - non weight bearing  - PT/OT - recommending SNF  DM2, stable: well-controlled on metformin 500mg  qdaily at home. Last A1c 7.2 in January.  - Metformin daily with breakfast  Alcohol use disorder, improved: patient states he drinks 8 beers daily.  Most recent CIWAs 0. - monitor CIWA w/ ativan PRN - folate, thiamine, multivitamin  HLD, stable: pravastatin 10mg  at home - continue home med  GERD: pt takes 10mg  prilosec at home - protonix 40mg  qdaily  BPH: takes flomax 0.4mg  at bedtime and finasteride 5mg  at home.  -Continue home meds  Electrolytes: none Nutrition: carb modified GI ppx: protonix DVT px: lovenox 40  Disposition: Patient is medically cleared for SNF. Preference is [facility].  Subjective:  Patient getting cleaned up from using the bathroom. Still awaiting SNF. No concerns this morning.   Objective: Temp:  [98 F (36.7 C)-99.2 F (37.3 C)]  98 F (36.7 C) (03/13 0442) Pulse Rate:  [64-89] 86 (03/13 0442) Resp:  [16-20] 20 (03/13 0442) BP: (82-159)/(47-99) 142/85 (03/13 0442) SpO2:  [95 %-97 %] 95 % (03/13 0442) Physical Exam: unchanged General: No apparent distress Cardiovascular: RRR, S1-S2 present, no murmurs, rubs, gallops Respiratory: Distant breath sounds secondary to body habitus, clear to auscultation bilaterally Abdomen: Obese abdomen, nontender, bowel sounds 4 quadrants Extremities: Right lower extremity wrapped in Ace bandage with brace holding knee in extension, patient has full range of motion in his right ankle, is able to wiggle toes in bilateral feet  Laboratory: Recent Labs  Lab 10/03/18 0555 10/04/18 0331 10/05/18 0240  WBC 5.6 6.0 6.1  HGB 10.7* 10.8* 11.5*  HCT 35.9* 34.9* 37.8*  PLT 149* 166 186   Recent Labs  Lab 10/03/18 0555 10/04/18 0331 10/05/18 0240  NA 134* 133* 133*  K 4.4 4.4 4.2  CL 101 97* 98  CO2 27 28 24   BUN 13 10 13   CREATININE 0.83 0.79 0.79  CALCIUM 9.1 9.0 9.3  GLUCOSE 113* 124* 107*   PTH: Normal at 24 Calcium: Normal at 8.7 TSH: Normal at 0.794 Testosterone: Low at 119 Free testosterone: Low at 1.9 Sex hormone binding: 39.9 Vitamin D: Normal at 34.8 Synovial fluid right knee culture: Few WBC present, predominantly PMNs, no organisms seen, no growth at 2 days  Imaging/Diagnostic Tests: No new imaging  [name], DO 10/08/2018, 7:33 AM PGY-1, [place] Intern pager: [phone], text pages welcome

## 2018-10-08 NOTE — TOC Transition Note (Signed)
Transition of Care [facility]) - CM/SW Discharge Note   Patient Details  Name: Brandon [name] MRN: [id number] Date of Birth: Aug 03, 1945  [facility] CM/SW Contact:  Benard Halsted, LCSW Phone Number: 10/08/2018, 3:50 PM   Clinical Narrative:    Patient will DC to: [place] Anticipated DC date: 10/08/18 Family notified: Son-Stephen Transport by: Corey Harold   Per MD patient ready for DC to [place]. RN, patient, patient's family, and facility notified of DC. Discharge Summary and FL2 sent to facility. RN to call report prior to discharge [phone] Room 106P). DC packet on chart. Ambulance transport requested for patient.   CSW will sign off for now as social work intervention is no longer needed. Please consult [place] again if new needs arise.  Cedric Fishman, LCSW Clinical Social Worker [phone]    Final next level of care: Skilled Nursing Facility Barriers to Discharge: No Barriers Identified   Patient Goals and CMS Choice Patient states their goals for this hospitalization and ongoing recovery are:: Patient reports he wants to go home CMS Medicare.gov Compare Post Acute Care list provided to:: Patient Represenative (must comment)(son [name]) Choice offered to / list presented to : Adult Children  Discharge Placement PASRR number recieved: 10/08/18 Existing PASRR number confirmed : 10/08/18          Patient chooses bed at: [facility] Patient to be transferred to facility by: [facility] Name of family member notified: Son-Stephen Patient and family notified of of transfer: 10/08/18  Discharge Plan and Services   Post Acute Care Choice: [place]          DME Arranged: N/A DME Agency: NA HH Arranged: NA HH Agency: NA   Social Determinants of Health (SDOH) Interventions     Readmission Risk Interventions No flowsheet data found.

## 2018-10-08 NOTE — Progress Notes (Signed)
Report given to Amy at [facility]. No further questions. No PIVs. Patient in no distress. Waiting on [place] for transport.

## 2018-10-11 NOTE — Op Note (Signed)
NAMEDALTYN, DEGROAT MEDICAL RECORD [id number] ACCOUNT [id number] DATE OF BIRTH:10-20-1945 FACILITY: [facility] LOCATION: [facility] PHYSICIAN:Lyndall Windt H. Abdulkareem Badolato, MD  OPERATIVE REPORT  DATE OF PROCEDURE:  09/30/2018  PREOPERATIVE DIAGNOSES:   1.  Right patella fracture. 2.  Right great toe proximal phalanx fracture. 3.  Second toe proximal phalanx fracture.  POSTOPERATIVE DIAGNOSES:  1.  Right patella fracture. 2.  Right great toe proximal phalanx fracture. 3.  Second toe proximal phalanx fracture.  PROCEDURES: 1.  Open reduction internal fixation of right patella. 2.  Closed treatment of a great toe proximal phalanx fracture. 3.  Closed treatment of a second toe proximal phalanx fracture. 4.  Stress fluoroscopy of the right patella and toe fractures.  SURGEON:  Altamese Dayton, MD  ASSISTANT:  Ainsley Spinner PA-C.  ANESTHESIA:  General.  COMPLICATIONS:  None.  DISPOSITION:  To PACU.  CONDITION:  Stable.  INDICATIONS FOR PROCEDURE:  The patient is a 73 year old male with multiple medical problems and low overall activity who noted significant knee pain and weakness after a fall.  He has a BMI over 52 with a history of previous knee infections as well.  I  discussed with him the risks and benefits of surgical treatment including the possibility of infection, particularly given the previous history, also disruption of the repair, particularly if there should be any noncompliance, need for further surgery,  malunion, nonunion, symptomatic hardware and others.  After full discussion, he did wish to proceed.  BRIEF SUMMARY OF PROCEDURE:  The patient was taken to the operating room where general anesthesia was induced.  His right lower extremity was prepped and draped in the usual sterile fashion.  I brought in the C-arm and performed a direct evaluation of  the great toe fractures under anesthesia and did not identify any displacement at the articular surface with stress, performing gentle  flexion and extension.  After chlorhexidine wash Betadine scrub and paint at the knee, a standard anterior midline  approach was made carrying dissection down to the retinaculum.  Here, the fracture site was identified off the distal pole.  A [place] type suture with #5 FiberWire was passed distally through the tendon using 2 limbs.  These were then brought back  through the base of the patella through bone tunnels and tied over the superior aspect.  There was also a small longitudinal fracture line off the medial aspect of the patella and here, additional FiberWire was interwoven in a cerclage type fashion again  with tying the suture superiorly.  C-arm was then brought in and the knee taken through a gentle range of motion identifying no displacement or gapping.  Wound was reapproximated in a standard layered fashion #1 Vicryl, 2-0 Vicryl and 2-0 nylon.   Sterile gently compressive dressing was applied.    Ainsley Spinner, PA-C, was present and assisting throughout which was necessary given the patient's morbid obesity and elevated BMI.  PROGNOSIS:  The patient is at increased risk for complications, particularly infection as well as a loss of reduction given the perceived potential for noncompliance.  The patient has been strongly counseled regarding this and these efforts will continue  with respect to motivation.  He will be in a postop shoe for the proximal phalanx fractures of greater than left and second toe.    We will plan to see him back in the office for removal of sutures in approximately 2 weeks'.  Skilled nursing may be necessary.  AN/NUANCE  D:10/11/2018 T:10/11/2018 JOB:[id number]

## 2019-07-29 DIAGNOSIS — D649 Anemia, unspecified: Secondary | ICD-10-CM | POA: Diagnosis present

## 2019-07-29 DIAGNOSIS — K31811 Angiodysplasia of stomach and duodenum with bleeding: Secondary | ICD-10-CM

## 2019-07-29 DIAGNOSIS — E669 Obesity, unspecified: Secondary | ICD-10-CM

## 2019-07-29 HISTORY — DX: Angiodysplasia of stomach and duodenum with bleeding: K31.811

## 2019-07-29 HISTORY — DX: Obesity, unspecified: E66.9

## 2019-07-29 HISTORY — DX: Anemia, unspecified: D64.9

## 2019-08-13 ENCOUNTER — Inpatient Hospital Stay (HOSPITAL_COMMUNITY)
Admission: EM | Admit: 2019-08-13 | Discharge: 2019-08-16 | DRG: 378 | Disposition: A | Discharge status: Home / Self Care | Payer: Medicare Other | Attending: Family Medicine | Admitting: Family Medicine

## 2019-08-13 ENCOUNTER — Emergency Department (HOSPITAL_COMMUNITY): Payer: Medicare Other

## 2019-08-13 DIAGNOSIS — E785 Hyperlipidemia, unspecified: Secondary | ICD-10-CM | POA: Diagnosis present

## 2019-08-13 DIAGNOSIS — K7031 Alcoholic cirrhosis of liver with ascites: Secondary | ICD-10-CM | POA: Diagnosis present

## 2019-08-13 DIAGNOSIS — Z20822 Contact with and (suspected) exposure to covid-19: Secondary | ICD-10-CM | POA: Diagnosis present

## 2019-08-13 DIAGNOSIS — M25561 Pain in right knee: Secondary | ICD-10-CM | POA: Diagnosis present

## 2019-08-13 DIAGNOSIS — K922 Gastrointestinal hemorrhage, unspecified: Secondary | ICD-10-CM | POA: Diagnosis present

## 2019-08-13 DIAGNOSIS — Z951 Presence of aortocoronary bypass graft: Secondary | ICD-10-CM

## 2019-08-13 DIAGNOSIS — D649 Anemia, unspecified: Secondary | ICD-10-CM | POA: Diagnosis present

## 2019-08-13 DIAGNOSIS — E119 Type 2 diabetes mellitus without complications: Secondary | ICD-10-CM | POA: Diagnosis present

## 2019-08-13 DIAGNOSIS — Z800 Family history of malignant neoplasm of digestive organs: Secondary | ICD-10-CM

## 2019-08-13 DIAGNOSIS — I959 Hypotension, unspecified: Secondary | ICD-10-CM | POA: Diagnosis present

## 2019-08-13 DIAGNOSIS — N400 Enlarged prostate without lower urinary tract symptoms: Secondary | ICD-10-CM | POA: Diagnosis present

## 2019-08-13 DIAGNOSIS — Z8249 Family history of ischemic heart disease and other diseases of the circulatory system: Secondary | ICD-10-CM

## 2019-08-13 DIAGNOSIS — Z66 Do not resuscitate: Secondary | ICD-10-CM | POA: Diagnosis present

## 2019-08-13 DIAGNOSIS — Z6841 Body Mass Index (BMI) 40.0 and over, adult: Secondary | ICD-10-CM

## 2019-08-13 DIAGNOSIS — I4891 Unspecified atrial fibrillation: Secondary | ICD-10-CM

## 2019-08-13 DIAGNOSIS — Z791 Long term (current) use of non-steroidal anti-inflammatories (NSAID): Secondary | ICD-10-CM

## 2019-08-13 DIAGNOSIS — T39395A Adverse effect of other nonsteroidal anti-inflammatory drugs [NSAID], initial encounter: Secondary | ICD-10-CM | POA: Diagnosis present

## 2019-08-13 DIAGNOSIS — D62 Acute posthemorrhagic anemia: Secondary | ICD-10-CM | POA: Diagnosis present

## 2019-08-13 DIAGNOSIS — M549 Dorsalgia, unspecified: Secondary | ICD-10-CM | POA: Diagnosis present

## 2019-08-13 DIAGNOSIS — K219 Gastro-esophageal reflux disease without esophagitis: Secondary | ICD-10-CM | POA: Diagnosis present

## 2019-08-13 DIAGNOSIS — R932 Abnormal findings on diagnostic imaging of liver and biliary tract: Secondary | ICD-10-CM | POA: Diagnosis not present

## 2019-08-13 DIAGNOSIS — K31811 Angiodysplasia of stomach and duodenum with bleeding: Principal | ICD-10-CM

## 2019-08-13 DIAGNOSIS — R809 Proteinuria, unspecified: Secondary | ICD-10-CM | POA: Diagnosis present

## 2019-08-13 DIAGNOSIS — Z7982 Long term (current) use of aspirin: Secondary | ICD-10-CM

## 2019-08-13 DIAGNOSIS — N179 Acute kidney failure, unspecified: Secondary | ICD-10-CM | POA: Diagnosis present

## 2019-08-13 DIAGNOSIS — R55 Syncope and collapse: Secondary | ICD-10-CM | POA: Diagnosis present

## 2019-08-13 DIAGNOSIS — Z79899 Other long term (current) drug therapy: Secondary | ICD-10-CM

## 2019-08-13 DIAGNOSIS — I10 Essential (primary) hypertension: Secondary | ICD-10-CM | POA: Diagnosis present

## 2019-08-13 DIAGNOSIS — I2510 Atherosclerotic heart disease of native coronary artery without angina pectoris: Secondary | ICD-10-CM | POA: Diagnosis present

## 2019-08-13 DIAGNOSIS — Z85038 Personal history of other malignant neoplasm of large intestine: Secondary | ICD-10-CM | POA: Diagnosis not present

## 2019-08-13 DIAGNOSIS — R791 Abnormal coagulation profile: Secondary | ICD-10-CM | POA: Diagnosis present

## 2019-08-13 DIAGNOSIS — Z87891 Personal history of nicotine dependence: Secondary | ICD-10-CM

## 2019-08-13 DIAGNOSIS — Z23 Encounter for immunization: Secondary | ICD-10-CM

## 2019-08-13 DIAGNOSIS — Z7984 Long term (current) use of oral hypoglycemic drugs: Secondary | ICD-10-CM

## 2019-08-13 DIAGNOSIS — T45515A Adverse effect of anticoagulants, initial encounter: Secondary | ICD-10-CM | POA: Diagnosis present

## 2019-08-13 DIAGNOSIS — I8510 Secondary esophageal varices without bleeding: Secondary | ICD-10-CM | POA: Diagnosis present

## 2019-08-13 DIAGNOSIS — G894 Chronic pain syndrome: Secondary | ICD-10-CM | POA: Diagnosis present

## 2019-08-13 DIAGNOSIS — I482 Chronic atrial fibrillation, unspecified: Secondary | ICD-10-CM | POA: Diagnosis not present

## 2019-08-13 DIAGNOSIS — K769 Liver disease, unspecified: Secondary | ICD-10-CM | POA: Diagnosis not present

## 2019-08-13 DIAGNOSIS — R161 Splenomegaly, not elsewhere classified: Secondary | ICD-10-CM | POA: Diagnosis present

## 2019-08-13 DIAGNOSIS — R21 Rash and other nonspecific skin eruption: Secondary | ICD-10-CM | POA: Diagnosis present

## 2019-08-13 HISTORY — DX: Type 2 diabetes mellitus without complications: E11.9

## 2019-08-13 LAB — CBC WITH DIFFERENTIAL/PLATELET
Abs Immature Granulocytes: 0.23 10*3/uL — ABNORMAL HIGH (ref 0.00–0.07)
Abs Immature Granulocytes: 0.2 10*3/uL — ABNORMAL HIGH (ref 0.00–0.07)
Basophils Absolute: 0.1 10*3/uL (ref 0.0–0.1)
Basophils Absolute: 0.1 10*3/uL (ref 0.0–0.1)
Basophils Relative: 1 %
Basophils Relative: 1 %
Eosinophils Absolute: 1.1 10*3/uL — ABNORMAL HIGH (ref 0.0–0.5)
Eosinophils Absolute: 1.3 10*3/uL — ABNORMAL HIGH (ref 0.0–0.5)
Eosinophils Relative: 10 %
Eosinophils Relative: 11 %
HCT: 25.1 % — ABNORMAL LOW (ref 39.0–52.0)
HCT: 21.3 % — ABNORMAL LOW (ref 39.0–52.0)
Hemoglobin: 7.9 g/dL — ABNORMAL LOW (ref 13.0–17.0)
Hemoglobin: 6.7 g/dL — CL (ref 13.0–17.0)
Immature Granulocytes: 2 %
Immature Granulocytes: 2 %
Lymphocytes Relative: 12 %
Lymphocytes Relative: 15 %
Lymphs Abs: 1.3 10*3/uL (ref 0.7–4.0)
Lymphs Abs: 1.7 10*3/uL (ref 0.7–4.0)
MCH: 31.7 pg (ref 26.0–34.0)
MCH: 31.2 pg (ref 26.0–34.0)
MCHC: 31.5 g/dL (ref 30.0–36.0)
MCHC: 31.5 g/dL (ref 30.0–36.0)
MCV: 100.8 fL — ABNORMAL HIGH (ref 80.0–100.0)
MCV: 99.1 fL (ref 80.0–100.0)
Monocytes Absolute: 0.6 10*3/uL (ref 0.1–1.0)
Monocytes Absolute: 0.8 10*3/uL (ref 0.1–1.0)
Monocytes Relative: 5 %
Monocytes Relative: 7 %
Neutro Abs: 7.3 10*3/uL (ref 1.7–7.7)
Neutro Abs: 7.2 10*3/uL (ref 1.7–7.7)
Neutrophils Relative %: 70 %
Neutrophils Relative %: 64 %
Platelets: 221 10*3/uL (ref 150–400)
Platelets: 219 10*3/uL (ref 150–400)
RBC: 2.49 MIL/uL — ABNORMAL LOW (ref 4.22–5.81)
RBC: 2.15 MIL/uL — ABNORMAL LOW (ref 4.22–5.81)
RDW: 16.3 % — ABNORMAL HIGH (ref 11.5–15.5)
RDW: 16 % — ABNORMAL HIGH (ref 11.5–15.5)
WBC: 10.5 10*3/uL (ref 4.0–10.5)
WBC: 11.2 10*3/uL — ABNORMAL HIGH (ref 4.0–10.5)
nRBC: 0.7 % — ABNORMAL HIGH (ref 0.0–0.2)
nRBC: 0.8 % — ABNORMAL HIGH (ref 0.0–0.2)

## 2019-08-13 LAB — PROTIME-INR
INR: 2.6 — ABNORMAL HIGH (ref 0.8–1.2)
Prothrombin Time: 27.9 seconds — ABNORMAL HIGH (ref 11.4–15.2)

## 2019-08-13 LAB — COMPREHENSIVE METABOLIC PANEL
ALT: 16 U/L (ref 0–44)
AST: 15 U/L (ref 15–41)
Albumin: 2.5 g/dL — ABNORMAL LOW (ref 3.5–5.0)
Alkaline Phosphatase: 67 U/L (ref 38–126)
Anion gap: 10 (ref 5–15)
BUN: 78 mg/dL — ABNORMAL HIGH (ref 8–23)
CO2: 20 mmol/L — ABNORMAL LOW (ref 22–32)
Calcium: 9.4 mg/dL (ref 8.9–10.3)
Chloride: 107 mmol/L (ref 98–111)
Creatinine, Ser: 1.36 mg/dL — ABNORMAL HIGH (ref 0.61–1.24)
GFR calc Af Amer: 60 mL/min — ABNORMAL LOW (ref >60)
GFR calc non Af Amer: 52 mL/min — ABNORMAL LOW (ref >60)
Glucose, Bld: 131 mg/dL — ABNORMAL HIGH (ref 70–99)
Potassium: 4.5 mmol/L (ref 3.5–5.1)
Sodium: 137 mmol/L (ref 135–145)
Total Bilirubin: 0.4 mg/dL (ref 0.3–1.2)
Total Protein: 5.1 g/dL — ABNORMAL LOW (ref 6.5–8.1)

## 2019-08-13 LAB — PREPARE RBC (CROSSMATCH)

## 2019-08-13 LAB — RESPIRATORY PANEL BY RT PCR (FLU A&B, COVID)
Influenza A by PCR: NEGATIVE
Influenza B by PCR: NEGATIVE
SARS Coronavirus 2 by RT PCR: NEGATIVE

## 2019-08-13 LAB — LACTIC ACID, PLASMA
Lactic Acid, Venous: 1.9 mmol/L (ref 0.5–1.9)
Lactic Acid, Venous: 1.3 mmol/L (ref 0.5–1.9)

## 2019-08-13 LAB — URINALYSIS, ROUTINE W REFLEX MICROSCOPIC
Bilirubin Urine: NEGATIVE
Glucose, UA: NEGATIVE mg/dL
Ketones, ur: NEGATIVE mg/dL
Nitrite: NEGATIVE
Protein, ur: 100 mg/dL — AB
Specific Gravity, Urine: 1.013 (ref 1.005–1.030)
WBC, UA: >50 WBC/hpf — ABNORMAL HIGH (ref 0–5)
pH: 5 (ref 5.0–8.0)

## 2019-08-13 LAB — POC OCCULT BLOOD, ED: Fecal Occult Bld: POSITIVE — AB

## 2019-08-13 LAB — MAGNESIUM: Magnesium: 1.8 mg/dL (ref 1.7–2.4)

## 2019-08-13 LAB — ABO/RH: ABO/RH(D): B POS

## 2019-08-13 LAB — APTT: aPTT: 35 seconds (ref 24–36)

## 2019-08-13 LAB — TROPONIN I (HIGH SENSITIVITY)
Troponin I (High Sensitivity): 5 ng/L (ref <18)
Troponin I (High Sensitivity): 5 ng/L (ref <18)

## 2019-08-13 IMAGING — DX DG CHEST 1V PORT
1 series · 1 of 1 positions shown · non-contrast
Comparison: None.

CLINICAL DATA: Hypertension. Shortness of breath.

EXAM:
PORTABLE CHEST 1 VIEW

[chest ap]
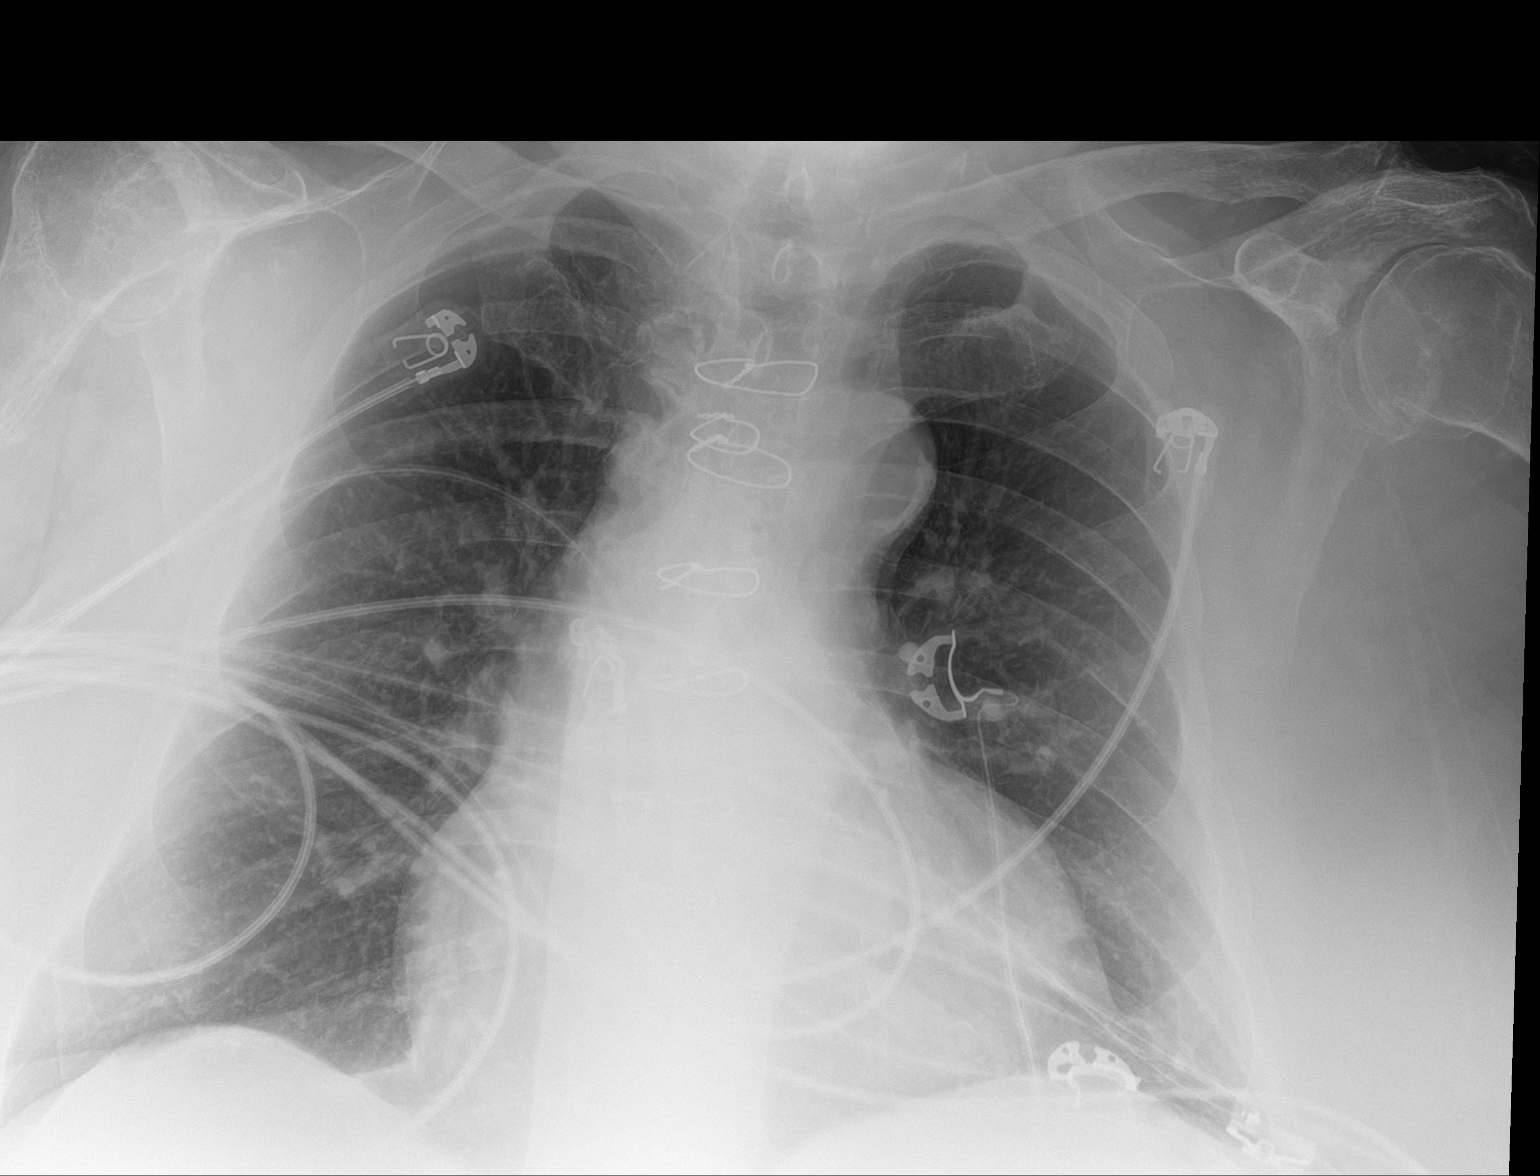

[1 of 1 positions shown; findings below may reference images not displayed]

FINDINGS: Postsurgical changes from CABG. Calcific atherosclerotic disease of
the aorta.

Cardiomediastinal silhouette is normal. Mediastinal contours appear
intact.

There is no evidence of focal airspace consolidation, pleural
effusion or pneumothorax.

Osseous structures are without acute abnormality. Soft tissues are
grossly normal.
IMPRESSION: 1. No evidence of acute cardiopulmonary abnormality.
2. Calcific atherosclerotic disease of the aorta.

## 2019-08-13 MED ORDER — SODIUM CHLORIDE 0.9 % IV SOLN: INTRAVENOUS | Status: DC

## 2019-08-13 MED ORDER — SODIUM CHLORIDE 0.9% IV SOLUTION: Freq: Once | INTRAVENOUS | Status: AC

## 2019-08-13 MED ORDER — FINASTERIDE 5 MG PO TABS
5.0000 mg | ORAL_TABLET | Freq: Every day | ORAL | Status: DC
  Administered 2019-08-13 – 2019-08-16 (×4): 5 mg via ORAL
  Filled 2019-08-13 (×5): qty 1

## 2019-08-13 MED ORDER — SODIUM CHLORIDE 0.9 % IV BOLUS
500.0000 mL | Freq: Once | INTRAVENOUS | Status: AC
  Administered 2019-08-13: 500 mL via INTRAVENOUS

## 2019-08-13 MED ORDER — PANTOPRAZOLE SODIUM 40 MG IV SOLR: 40.0000 mg | Freq: Two times a day (BID) | INTRAVENOUS | Status: DC

## 2019-08-13 MED ORDER — DICLOFENAC SODIUM 1 % EX GEL
2.0000 g | Freq: Four times a day (QID) | CUTANEOUS | Status: DC | PRN
  Administered 2019-08-14: 2 g via TOPICAL
  Filled 2019-08-13: qty 100

## 2019-08-13 MED ORDER — TAMSULOSIN HCL 0.4 MG PO CAPS
0.4000 mg | ORAL_CAPSULE | Freq: Every day | ORAL | Status: DC
  Administered 2019-08-13 – 2019-08-15 (×3): 0.4 mg via ORAL
  Filled 2019-08-13 (×3): qty 1

## 2019-08-13 MED ORDER — LIDOCAINE 5 % EX PTCH
1.0000 | MEDICATED_PATCH | CUTANEOUS | Status: DC
  Administered 2019-08-13 – 2019-08-15 (×3): 1 via TRANSDERMAL
  Filled 2019-08-13 (×3): qty 1

## 2019-08-13 MED ORDER — PANTOPRAZOLE SODIUM 40 MG IV SOLR
40.0000 mg | Freq: Once | INTRAVENOUS | Status: AC
  Administered 2019-08-13: 40 mg via INTRAVENOUS
  Filled 2019-08-13: qty 40

## 2019-08-13 MED ORDER — ACETAMINOPHEN 650 MG RE SUPP: 650.0000 mg | Freq: Four times a day (QID) | RECTAL | Status: DC | PRN

## 2019-08-13 MED ORDER — ACETAMINOPHEN 325 MG PO TABS
650.0000 mg | ORAL_TABLET | Freq: Four times a day (QID) | ORAL | Status: DC | PRN
  Administered 2019-08-14: 650 mg via ORAL
  Filled 2019-08-13: qty 2

## 2019-08-13 NOTE — ED Triage Notes (Signed)
Pt presents to the ED by EMS for dizziness that began last night sudden onset. Pt reports that he began to have SOB when he stands and tries to walk around. Pt was started on Xarelto yesterday. Pt reports a known hx of a-fib. BP 120/60. Glucose en route 120. HR 84-124 a-fib per EMS.

## 2019-08-13 NOTE — Progress Notes (Signed)
Family Medicine Teaching Service Daily Progress Note Intern Pager: [phone]  Patient name: Brandon [name] Medical record number: [id number] Date of birth: Sep 16, 1945 Age: 74 y.o. Gender: male  Primary Care Provider: Helane Rima, MD Consultants: GI Code Status: DNR  Pt Overview and Major Events to Date:  1/16 - admitted for acute GI bleed  Assessment and Plan: Brandon [name] is a 74 y.o. male presenting with 1 day of dizziness. PMH is significant for colon cancer, type 2 diabetes, chronic lower extremity pain, atrial fibrillation, obesity, hypertension, hyperlipidemia, hypertension, CAD CABG x2, BPH, history of liver cirrhosis, history of alcohol abuse  Acute GI bleed 2/2 chronic NSAID use and recently started Xarelto Hb 8.9 (bl ~13.8) > 6.7 overnight requiring transfusion, currently receiving 2u pRBC, will follow post H&H. Likely upper GI source given h/o melena. Remained hemodynamically stable overnight. Continues on IV protonix. GI following.  - s/p 2u pRBC, f/u post H&H -IV Protonix every 12 hours -N.p.o. -GI consult, appreciate recommendations -SCDs -Hold anticoagulation until hemoglobin stabilizes  AKI Creatinine 1.3 on admission with baseline of 0.9-1. Labs pending this am.  Likely prerenal given GI bleed, although BUN/Cr ratio 8, could be ischemic ATN. UA is positive for hemoglobin large leuks, bacteria and protein though is asymptomatic.  Urine culture obtained in ED.  Expect to improve after hydration and transfusion, may consider further urine studies if not. -Continue IV fluids, transfusion -Hold lisinopril and avoid nephrotoxic agents.  Atrial fibrillation EKG shows A. fib that is rate controlled on admission.  CHA2DS2-VASc 2 4.  HASBLED 5.  Patient previously anticoagulated on Xarelto.  Questionable takes propranolol. -Cardiac telemetry -Hold anticoagulation in setting of acute bleed -Monitor heart rate, low threshold to restart propranolol for rate  control  Diabetes mellitus type 2 A1c 6.4 on 05/2019.  Home medication includes metformin XR 500 mg.  Labs pending this am. -Hold Metformin -Monitor glucose with BMP in a.m.  Hypertension Initially hypotensive with systolics in the 0000000.  Improved status post bolus of 500 mL and initiating transfusion to systolics in the [id number].  Takes amlodipine 10 mg and lisinopril 40 mg at home. -Hold home medications -Monitor blood pressures  Hyperlipidemia Home medication pravastatin.  -Hold statin while n.p.o.  GERD Patient takes omeprazole 10 at home. -Continue IV Protonix -GI recommendations appreciated  BPH Takes finasteride and tamsulosin at home.  CAD with CABG x2 Patient takes ASA 81 daily.  On pravastatin. -Hold home medications in setting of GI bleed  Morbid obesity -Recommend weight management in the outpatient setting.  Chronic knee pain Patient with history of patellar fracture of the right knee status post orthopedic repair in 09/2018.  Reports continued pain.  Patient takes roughly "20" ibuprofen a day for pain. 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  Patient reports being sober for 1 year.  LFTs normal admission. -PT/INR and APTT pending, unsure clinical usefulness in setting of recently starting Xarelto -CIWA scores: 1 overnight  History of colon cancer 2002 Patient with history of colon cancer several years ago.  Does not have a local GI doctor. -Appreciate GI recommendations  FEN/GI: N.p.o., IV Protonix, maintenance IV fluids Prophylaxis: SCDs  Disposition: continue inpt management  Subjective:  With chronic back pain this am, requesting tylenol. Denies SOB, CP.  Objective: Temp:  [98.3 F (36.8 C)] 98.3 F (36.8 C) (01/16 1540) Pulse Rate:  [86-105]  105 (01/16 2043) Resp:  [18-21]  18 (01/16 2043) BP: (93-115)/(42-64) 111/59 (01/16 2043) SpO2:  [97 %-100 %] 100 % (01/16 2043) Weight:  [147 kg] 147 kg (01/16 1538) Physical Exam: General: obese male, lying in bed, in NAD Cardiovascular: irregular rhythm, regular rate Respiratory: CTAB, normal WOB on RA Abdomen: obese abdomen, soft, nonTTP Extremities: warm and well perfused, no LE edema  Laboratory: Recent Labs  Lab 08/13/19 1614 08/13/19 2055  WBC 10.5 11.2*  HGB 7.9* 6.7*  HCT 25.1* 21.3*  PLT 221 219   Recent Labs  Lab 08/13/19 1614  NA 137  K 4.5  CL 107  CO2 20*  BUN 78*  CREATININE 1.36*  CALCIUM 9.4  PROT 5.1*  BILITOT 0.4  ALKPHOS 67  ALT 16  AST 15  GLUCOSE 131*    Imaging/Diagnostic Tests: DG Chest Portable 1 View  Result Date: 08/13/2019 CLINICAL DATA:  Hypertension. Shortness of breath. EXAM: PORTABLE CHEST 1 VIEW COMPARISON:  None. FINDINGS: Postsurgical changes from CABG. Calcific atherosclerotic disease of the aorta. Cardiomediastinal silhouette is normal. Mediastinal contours appear intact. There is no evidence of focal airspace consolidation, pleural effusion or pneumothorax. Osseous structures are without acute abnormality. Soft tissues are grossly normal. IMPRESSION: 1. No evidence of acute cardiopulmonary abnormality. 2. Calcific atherosclerotic disease of the aorta. Electronically Signed   By: Fidela Salisbury M.D.   On: 08/13/2019 16:46    Rory Percy, DO 08/13/2019, 9:29 PM PGY-3, [place] Intern pager: [phone], text pages welcome

## 2019-08-13 NOTE — ED Notes (Signed)
Notified Dr. Ky Barban of patients hgb of 6.7

## 2019-08-13 NOTE — H&P (Signed)
[facility] Admission History and Physical Service Pager: [phone]  Patient name: Brandon [name] Medical record number: [id number] Date of birth: [birth year] Age: 74 y.o. Gender: male  Primary Care Provider: Helane Rima, MD Consultants: GI Code Status:   Code Status: DNR   Chief Complaint: Dizziness  Assessment and Plan: [name] is a 74 y.o. male presenting with 1 day of dizziness. PMH is significant for colon cancer, type 2 diabetes, chronic lower extremity pain, atrial fibrillation, obesity, hypertension, hyperlipidemia, hypertension, CAD CABG x2, BPH, history of liver cirrhosis, history of alcohol abuse  Acute GI bleed secondary to chronic NSAID use and recently started Xarelto Patient with chronic knee pain, takes high doses of NSAIDs.  Reports melena for the past 3 weeks.  Also recently started on Xarelto for new onset A. fib.  Reports feeling dizzy over the past day.  Hemoglobin 8.9 on mission, hemoglobin 13.8 on 08/05/2019.  FOBT positive in ED.  Initially hypotensive, but given bolus of 500 mL and now normotensive.  Patient is now asymptomatic resting comfortably in bed.  Patient hemodynamic stable.  Likely has upper GI bleed given history.  Transfusion threshold 8 given history of CAD.  Will monitor hemoglobin closely with serial CBCs.  Low threshold to transfuse.  Patient already started on IV PPI and n.p.o. in the ED.  GI has been consulted.  Although has history of A. fib, we will hold anticoagulation until we have verification that hemoglobin is stabilized.  Patient shortness of breath is unlikely be cardiac as is no ST changes on EKG.  Troponins also 5x2.  Satting well on room air.  Chest x-ray is negative for acute cardiopulmonary issues. -Admit to [facility], attending Dr. Andria Frames -Cardiac telemetry -Continuous pulse ox -IV Protonix every 12 hours -N.p.o. -Serial CBC every 4 hours -MIVF -GI consult, appreciate recommendations -SCDs -Hold  anticoagulation until hemoglobin stabilizes  AKI Creatinine 1.3 with baseline of 0.9-1.1.  Also with elevated BUN to 76, likely elevated in setting of GI bleed.  UA is positive for hemoglobin and large leuks.  Patient denies dysuria.  Patient has known proteinuria in setting of diabetes.  There is a many bacteria and greater than 50 WBCs.  Urine culture obtained.  Likely has asymptomatic bacteriuria. -Continue IV fluids -Hold lisinopril and avoid nephrotoxic agents.  Atrial fibrillation EKG shows A. fib that is rate controlled on admission.  CHA2DS2-VASc 2 4.  HASBLED 5.  Patient previously anticoagulated on Xarelto.  Questionable takes propranolol. -Cardiac telemetry -Hold anticoagulation in setting of acute bleed -Monitor heart rate, low threshold to restart propranolol for rate control  Diabetes mellitus type 2 A1c 6.4 on 05/2019.  Home medication includes metformin XR 500 mg.  Glucose 130 on admission. -Hold Metformin -Monitor glucose with BMP in a.m.  Hypertension Initially hypotensive with systolics in the 0000000.  Improved status post bolus of [id number] mL to systolics in the [id number].  Takes amlodipine 10 mg and lisinopril 40 mg at home. -Hold home medications -Monitor blood pressures, can restart if patient becomes hypotensive  Hyperlipidemia Home medication pravastatin.  -Hold statin while n.p.o.  GERD Patient takes omeprazole 10 at home. -Continue IV Protonix -GI recommendations appreciated  BPH Takes finasteride and tamsulosin at home.  CAD with CABG x2 Patient takes ASA 81 daily.  On pravastatin. -Hold home medications in setting of GI bleed  Morbid obesity -Recommend weight management in the outpatient setting.  Chronic knee pain Patient with history of patellar fracture of the right knee status post  orthopedic repair in 09/2018.  Reports continued pain.  Patient takes roughly "20 "ibuprofen a day for pain. 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  Patient reports being sober for 1 year.  LFTs normal admission. -PT/INR and APTT pending, unsure clinical usefulness in setting of recently starting Xarelto -CIWA scores  History of colon cancer 2002 Patient with history of colon cancer several years ago.  Does not have a local GI doctor. -Appreciate GI recommendations  FEN/GI: N.p.o., IV Protonix, maintenance IV fluids Prophylaxis: SCDs  Disposition: Inpatient management of acute GI bleed  History of Present Illness:  Brandon [name] is a 74 y.o. male presenting with several weeks of dark stools and 1 day of feeling very dizzy recently after starting Xarelto for atrial fibrillation.  Persistent fatigue and weakness when walking around denies any chest pain, shortness of breath, abdominal pain, nausea, vomiting, fever, dysuria.  Does endorse severe right knee pain from recent orthopedic procedure and takes 20+ ibuprofen a day.  Past medical history significant for colon cancer several years ago, but he does not have a GI doctor Brandon [name] has not seen 1 in many years.  On arrival to the ED patient was tachycardic and mildly hypertensive with systolics in the [id number] 0000000.  Did improve after receiving half liter bolus.  FOBT positive.  Significant drop in hemoglobin over the past week from 13-7.9 today.  UA consistent with UTI, but patient is not endorsing any dysuria or abdominal pain, likely asymptomatic bacteriuria.  EKG did show atrial fibrillation.  Negative work-up markable.  Review Of Systems: As per HPI  Patient Active Problem List   Diagnosis Date Noted  . Symptomatic anemia 08/13/2019  . Avulsion of right patellar tendon   . Closed displaced fracture of phalanx of toe of right foot 10/06/2018  . 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 Patella fracture, extensor mechanism disruption R knee  09/29/2018  .  Hyponatremia 08/23/2018  . Obstipation 08/23/2018  . Colon cancer ([place]) 08/23/2018  . CAD (coronary artery disease) 08/23/2018  . DM (diabetes mellitus), type 2 ([place]) 08/23/2018  . Pyuria 08/23/2018    Past Medical History: Past Medical History:  Diagnosis Date  . Cancer ([place])   . Colon cancer ([place])   . Coronary artery disease     Past Surgical History: Past Surgical History:  Procedure Laterality Date  . APPLICATION OF WOUND VAC Right 09/30/2018   Procedure: Application Of Wound Vac;  Surgeon: Altamese [place], MD;  Location: [facility];  Service: Orthopedics;  Laterality: Right;  . CORONARY ARTERY BYPASS GRAFT    . PATELLAR TENDON REPAIR Right 09/30/2018   Procedure: PATELLA TENDON REPAIR;  Surgeon: Altamese [place], MD;  Location: [facility];  Service: Orthopedics;  Laterality: Right;    Social History: Social History   Tobacco Use  . Smoking status: Former Smoker    Types: Cigarettes  . Smokeless tobacco: Never Used  Substance Use Topics  . Alcohol use: Yes  . Drug use: Never   Additional social history: History of alcohol use, but says he been sober for 1 year.  No other drug use, former smoker,  Please also refer to relevant sections of EMR.  Family History: Family History  Problem Relation Age of Onset  . Colon cancer Father   . CAD Other   . Colon cancer Other   . Diabetes Neg  Hx     Allergies and Medications: No Known Allergies No current facility-administered medications on file prior to encounter.   Current Outpatient Medications on File Prior to Encounter  Medication Sig Dispense Refill  . allopurinol (ZYLOPRIM) 300 MG tablet Take 300 mg by mouth daily.    Marland Kitchen amLODipine (NORVASC) 10 MG tablet Take 10 mg by mouth daily.    Marland Kitchen aspirin 81 MG tablet Take 81 mg by mouth daily.    . cholecalciferol (VITAMIN D3) 25 MCG (1000 UT) tablet Take 2 tablets (2,000 Units total) by mouth 2 (two) times daily. 60 tablet 0  . enoxaparin (LOVENOX) 80 MG/0.8ML injection Inject 0.7 mLs  (70 mg total) into the skin daily for 11 days. 22.4 Syringe 0  . ferrous sulfate 325 (65 FE) MG tablet Take 325 mg by mouth daily.    . finasteride (PROSCAR) 5 MG tablet Take 5 mg by mouth daily.    . folic acid (FOLVITE) 1 MG tablet Take 1 tablet (1 mg total) by mouth daily. 30 tablet 0  . magnesium oxide (MAG-OX) 400 (241.3 Mg) MG tablet Take 1 tablet (400 mg total) by mouth daily. 60 tablet 2  . metFORMIN (GLUCOPHAGE-XR) 500 MG 24 hr tablet Take 500 mg by mouth every morning.    . Multiple Vitamin (MULTIVITAMIN) tablet Take 1 tablet by mouth daily.    . NONFORMULARY OR COMPOUNDED ITEM Apply 1 application topically 2 (two) times daily as needed. Triamcinolone 0.1% and Aquaphor compound steroid cream, 3:1 ratio, 1 pound jar    . omeprazole (PRILOSEC) 10 MG capsule Take 10 mg by mouth daily.    . polyethylene glycol (MIRALAX / GLYCOLAX) packet Take 17 g by mouth 2 (two) times daily. 14 each 0  . pravastatin (PRAVACHOL) 10 MG tablet Take 10 mg by mouth at bedtime.    . senna-docusate (SENOKOT-S) 8.6-50 MG tablet Take 1 tablet by mouth 2 (two) times daily. 30 tablet 0  . tamsulosin (FLOMAX) 0.4 MG CAPS capsule Take 0.4 mg by mouth at bedtime.    . thiamine 100 MG tablet Take 1 tablet (100 mg total) by mouth daily. 30 tablet 0  . vitamin C (VITAMIN C) 1000 MG tablet Take 1 tablet (1,000 mg total) by mouth daily. 30 tablet 0    Objective: BP 107/60   Pulse (!) 104   Temp 98.3 F (36.8 C) (Oral)   Resp 18   Ht 5\' 6"  (1.676 m)   Wt (!) 147 kg   SpO2 100%   BMI 52.31 kg/m  Exam: Physical Exam Vitals reviewed.  Constitutional:      General: He is not in acute distress.    Appearance: Normal appearance. He is obese.  HENT:     Head: Normocephalic and atraumatic.     Nose: Nose normal.     Mouth/Throat:     Mouth: Mucous membranes are moist.  Eyes:     Pupils: Pupils are equal, round, and reactive to light.  Cardiovascular:     Rate and Rhythm: Normal rate. Rhythm irregular.      Pulses: Normal pulses.     Heart sounds: Normal heart sounds.  Pulmonary:     Effort: Pulmonary effort is normal.     Breath sounds: Normal breath sounds.  Abdominal:     General: Abdomen is flat. There is no distension.     Tenderness: There is no abdominal tenderness.  Musculoskeletal:        General: Normal range of motion.  Cervical back: Normal range of motion.     Right lower leg: No edema.     Left lower leg: No edema.  Skin:    General: Skin is warm and dry.     Capillary Refill: Capillary refill takes less than 2 seconds.  Neurological:     General: No focal deficit present.     Mental Status: He is alert and oriented to person, place, and time.  Psychiatric:        Mood and Affect: Mood normal.        Behavior: Behavior normal.      Labs and Imaging: CBC BMET  Recent Labs  Lab 08/13/19 1614  WBC 10.5  HGB 7.9*  HCT 25.1*  PLT 221   Recent Labs  Lab 08/13/19 1614  NA 137  K 4.5  CL 107  CO2 20*  BUN 78*  CREATININE 1.36*  GLUCOSE 131*  CALCIUM 9.4       Bonnita Hollow, MD 08/13/2019, 7:23 PM PGY-3, [place] Intern pager: [phone], text pages welcome

## 2019-08-13 NOTE — ED Provider Notes (Signed)
[name]MENT Provider Note   CSN: [id number] Arrival date & time: 08/13/19  1533     History Chief Complaint  Patient presents with  . Dizziness    Br[name] is a 74 y.o. male.  The history is provided by the patient and medical records. No language interpreter was used.  Near Syncope This is a new problem. The current episode started yesterday. The problem occurs constantly. The problem has not changed since onset.Associated symptoms include shortness of breath. Pertinent negatives include no chest pain, no abdominal pain and no headaches. The symptoms are aggravated by standing. Nothing relieves the symptoms. He has tried nothing for the symptoms. The treatment provided no relief.       Past Medical History:  Diagnosis Date  . Cancer ([place])   . Colon cancer ([place])   . Coronary artery disease     Patient Active Problem List   Diagnosis Date Noted  . Avulsion of right patellar tendon   . Closed displaced fracture of phalanx of toe of right foot 10/06/2018  . 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 Patella fracture, extensor mechanism disruption R knee  09/29/2018  . Hyponatremia 08/23/2018  . Obstipation 08/23/2018  . Colon cancer ([place]) 08/23/2018  . CAD (coronary artery disease) 08/23/2018  . DM (diabetes mellitus), type 2 ([place]) 08/23/2018  . Pyuria 08/23/2018    Past Surgical History:  Procedure Laterality Date  . APPLICATION OF WOUND VAC Right 09/30/2018   Procedure: Application Of Wound Vac;  Surgeon: Altamese Gila Crossing, MD;  Location: [facility];  Service: Orthopedics;  Laterality: Right;  . CORONARY ARTERY BYPASS GRAFT    . PATELLAR TENDON REPAIR Right 09/30/2018   Procedure: PATELLA TENDON REPAIR;  Surgeon: Altamese Passaic, MD;  Location: [facility];  Service: Orthopedics;  Laterality: Right;       Family History  Problem Relation Age of Onset  . Colon cancer Father   . CAD Other   . Colon cancer Other   . Diabetes Neg Hx     Social  History   Tobacco Use  . Smoking status: Former Smoker    Types: Cigarettes  . Smokeless tobacco: Never Used  Substance Use Topics  . Alcohol use: Yes  . Drug use: Never    Home Medications Prior to Admission medications   Medication Sig Start Date End Date Taking? Authorizing Provider  allopurinol (ZYLOPRIM) 300 MG tablet Take 300 mg by mouth daily. 08/12/18   [provider]  amLODipine (NORVASC) 10 MG tablet Take 10 mg by mouth daily. 02/18/18   [provider]  aspirin 81 MG tablet Take 81 mg by mouth daily.    [provider]  cholecalciferol (VITAMIN D3) 25 MCG (1000 UT) tablet Take 2 tablets (2,000 Units total) by mouth 2 (two) times daily. 10/08/18   [name] C, DO  enoxaparin (LOVENOX) 80 MG/0.8ML injection Inject 0.7 mLs (70 mg total) into the skin daily for 11 days. 10/09/18 10/20/18  [name] C, DO  ferrous sulfate 325 (65 FE) MG tablet Take 325 mg by mouth daily. 11/15/15 11/15/19  [provider]  finasteride (PROSCAR) 5 MG tablet Take 5 mg by mouth daily. 02/18/18   [provider]  folic acid (FOLVITE) 1 MG tablet Take 1 tablet (1 mg total) by mouth daily. 10/09/18   [name] C, DO  magnesium oxide (MAG-OX) 400 (241.3 Mg) MG tablet Take 1 tablet (400 mg total) by mouth daily. 08/25/18   Schertz,  Herbie [place], MD  metFORMIN (GLUCOPHAGE-XR) 500 MG 24 hr tablet Take 500 mg by mouth every morning. 02/18/18   [provider]  Multiple Vitamin (MULTIVITAMIN) tablet Take 1 tablet by mouth daily.    [provider]  NONFORMULARY OR COMPOUNDED ITEM Apply 1 application topically 2 (two) times daily as needed. Triamcinolone 0.1% and Aquaphor compound steroid cream, 3:1 ratio, 1 pound jar 07/14/18   [provider]  omeprazole (PRILOSEC) 10 MG capsule Take 10 mg by mouth daily. 02/18/18   [provider]  polyethylene glycol (MIRALAX / GLYCOLAX) packet Take 17 g by mouth 2 (two) times daily. 10/08/18    [name], DO  pravastatin (PRAVACHOL) 10 MG tablet Take 10 mg by mouth at bedtime. 02/18/18   [provider]  senna-docusate (SENOKOT-S) 8.6-50 MG tablet Take 1 tablet by mouth 2 (two) times daily. 10/08/18   [name], DO  tamsulosin (FLOMAX) 0.4 MG CAPS capsule Take 0.4 mg by mouth at bedtime. 02/18/18   [provider]  thiamine 100 MG tablet Take 1 tablet (100 mg total) by mouth daily. 10/09/18   [name], DO  vitamin C (VITAMIN C) 1000 MG tablet Take 1 tablet (1,000 mg total) by mouth daily. 10/09/18   [name], DO    Allergies    Patient has no known allergies.  Review of Systems   Review of Systems  Constitutional: Positive for fatigue. Negative for chills, diaphoresis and fever.  HENT: Negative for congestion.   Eyes: Negative for visual disturbance.  Respiratory: Positive for shortness of breath. Negative for cough, chest tightness and wheezing.   Cardiovascular: Positive for near-syncope. Negative for chest pain, palpitations and leg swelling.  Gastrointestinal: Negative for abdominal distention, abdominal pain, constipation, diarrhea, nausea and vomiting.  Genitourinary: Negative for flank pain.  Musculoskeletal: Negative for back pain, neck pain and neck stiffness.  Skin: Negative for rash and wound.  Neurological: Positive for light-headedness. Negative for dizziness, syncope, weakness, numbness and headaches.  Psychiatric/Behavioral: Negative for agitation and confusion.  All other systems reviewed and are negative.   Physical Exam Updated Vital Signs BP (!) 93/42 (BP Location: Right Arm)   Pulse 86   Temp 98.3 F (36.8 C) (Oral)   Resp 18   Ht 5\' 6"  (1.676 m)   Wt (!) 147 kg   SpO2 97%   BMI 52.31 kg/m   Physical Exam Vitals and nursing note reviewed.  Constitutional:      General: He is not in acute distress.    Appearance: He is well-developed. He is not ill-appearing, toxic-appearing or diaphoretic.   HENT:     Head: Normocephalic and atraumatic.     Nose: Nose normal. No congestion or rhinorrhea.     Mouth/Throat:     Mouth: Mucous membranes are moist.     Pharynx: No oropharyngeal exudate or posterior oropharyngeal erythema.  Eyes:     Conjunctiva/sclera: Conjunctivae normal.     Pupils: Pupils are equal, round, and reactive to light.  Cardiovascular:     Rate and Rhythm: Regular rhythm. Tachycardia present.     Pulses: Normal pulses.     Heart sounds: No murmur.  Pulmonary:     Effort: Pulmonary effort is normal. No respiratory distress.     Breath sounds: Normal breath sounds. No wheezing, rhonchi or rales.  Chest:     Chest wall: No tenderness.  Abdominal:     General: Abdomen is flat. There is no distension.  Palpations: Abdomen is soft.     Tenderness: There is no abdominal tenderness. There is no right CVA tenderness or left CVA tenderness.  Genitourinary:    Rectum: Guaiac result positive.  Musculoskeletal:        General: No tenderness.     Cervical back: Neck supple. No tenderness.     Right lower leg: No edema.     Left lower leg: No edema.  Skin:    General: Skin is warm and dry.     Capillary Refill: Capillary refill takes less than 2 seconds.     Findings: Rash (chronic all over) present. No bruising, erythema or lesion.  Neurological:     General: No focal deficit present.     Mental Status: He is alert.     Sensory: No sensory deficit.     Motor: No weakness.  Psychiatric:        Mood and Affect: Mood normal.     ED Results / Procedures / Treatments   Labs (all labs ordered are listed, but only abnormal results are displayed) Labs Reviewed  CBC WITH DIFFERENTIAL/PLATELET - Abnormal; Notable for the following components:      Result Value   RBC 2.49 (*)    Hemoglobin 7.9 (*)    HCT 25.1 (*)    MCV 100.8 (*)    RDW 16.3 (*)    nRBC 0.7 (*)    Eosinophils Absolute 1.1 (*)    Abs Immature Granulocytes 0.23 (*)    All other components  within normal limits  COMPREHENSIVE METABOLIC PANEL - Abnormal; Notable for the following components:   CO2 20 (*)    Glucose, Bld 131 (*)    BUN 78 (*)    Creatinine, Ser 1.36 (*)    Total Protein 5.1 (*)    Albumin 2.5 (*)    GFR calc non Af Amer 52 (*)    GFR calc Af Amer 60 (*)    All other components within normal limits  URINALYSIS, ROUTINE W REFLEX MICROSCOPIC - Abnormal; Notable for the following components:   APPearance CLOUDY (*)    Hgb urine dipstick LARGE (*)    Protein, ur 100 (*)    Leukocytes,Ua LARGE (*)    WBC, UA >50 (*)    Bacteria, UA MANY (*)    All other components within normal limits  POC OCCULT BLOOD, ED - Abnormal; Notable for the following components:   Fecal Occult Bld POSITIVE (*)    All other components within normal limits  URINE CULTURE  RESPIRATORY PANEL BY RT PCR (FLU A&B, COVID)  LACTIC ACID, PLASMA  MAGNESIUM  LACTIC ACID, PLASMA  TYPE AND SCREEN  TROPONIN I (HIGH SENSITIVITY)  TROPONIN I (HIGH SENSITIVITY)    EKG EKG Interpretation  Date/Time:  Saturday August 13 2019 16:20:39 EST Ventricular Rate:  100 PR Interval:    QRS Duration: 110 QT Interval:  422 QTC Calculation: 545 R Axis:   61 Text Interpretation: Atrial fibrillation Low voltage, precordial leads Prolonged QT interval When compared to prior, similar afib. Longer QTc. No STEMI Confirmed by Antony Blackbird [phone]) on 08/13/2019 4:59:41 PM   Radiology DG Chest Portable 1 View  Result Date: 08/13/2019 CLINICAL DATA:  Hypertension. Shortness of breath. EXAM: PORTABLE CHEST 1 VIEW COMPARISON:  None. FINDINGS: Postsurgical changes from CABG. Calcific atherosclerotic disease of the aorta. Cardiomediastinal silhouette is normal. Mediastinal contours appear intact. There is no evidence of focal airspace consolidation, pleural effusion or pneumothorax. Osseous structures are without acute  abnormality. Soft tissues are grossly normal. IMPRESSION: 1. No evidence of acute  cardiopulmonary abnormality. 2. Calcific atherosclerotic disease of the aorta. Electronically Signed   By: Fidela Salisbury M.D.   On: 08/13/2019 16:46    Procedures Procedures (including critical care time)  CRITICAL CARE Performed by: Gwenyth Allegra Breiana Stratmann Total critical care time: 35 minutes Critical care time was exclusive of separately billable procedures and treating other patients. GI bleed on blood thinners with hypotension. Critical care was necessary to treat or prevent imminent or life-threatening deterioration. Critical care was time spent personally by me on the following activities: development of treatment plan with patient and/or surrogate as well as nursing, discussions with consultants, evaluation of patient's response to treatment, examination of patient, obtaining history from patient or surrogate, ordering and performing treatments and interventions, ordering and review of laboratory studies, ordering and review of radiographic studies, pulse oximetry and re-evaluation of patient's condition.   Medications Ordered in ED Medications  pantoprazole (PROTONIX) injection 40 mg (has no administration in time range)  sodium chloride 0.9 % bolus 500 mL (0 mLs Intravenous Stopped 08/13/19 1800)    ED Course  I have reviewed the triage vital signs and the nursing notes.  Pertinent labs & imaging results that were available during my care of the patient were reviewed by me and considered in my medical decision making (see chart for details).    MDM Rules/Calculators/A&P                      Cyree Hilinski is a 74 y.o. male with a past medical history significant for obesity, CAD status post CABG, diabetes, prior colon cancer, and recent discovery of atrial fibrillation starting on Xarelto yesterday who presents with near syncope, lightheadedness, fatigue, and exertional shortness of breath.  Patient reports that he was found to have A. fib several weeks ago and was discharged  on Xarelto yesterday.  He reports that since yesterday he has been having fatigue and lightheadedness.  Today it worsened and he was having shortness of breath with exertion and fatigue when he tries to stand up and walk around.  He reports that he chronically has dark stools as he takes iron supplementation for previous anemia.  He reports that he does not have a GI doctor in [place].  He denies any chest pain or palpitations but reports that he is has no energy and very fatigued.  He denies nausea or vomiting, fevers, chills, congestion, or cough.  EMS reports that when they found him, he did have heart rate that was A. fib between the 80s and 120s.  He had normal glucose in route.  On arrival, patient was found to have blood pressures in the 90s.  Orthostatics were checked and his blood pressure dropped in the 70s.  Patient felt symptomatic and lightheaded at that time.  He was given some fluids to start.  Patient was examined and had very dark tarry stool on fecal occult test.  It was positive.  Abdomen is nontender, lungs were clear.  Chest was nontender.  Patient has diffuse rash which she reports is chronic.  Clinically I am concerned about GI bleed that is new in the setting of Xarelto use.  His hemoglobin was checked and had dropped to 7.9 from 11.5 previously.  No leukocytosis.  Creatinine slightly up from prior.  AST and ALT not elevated.  Patient reports his urine has been darker and he does have some hemoglobin leukocytes and bacteria.  There were no nitrites and given his lack of dysuria, will hold on antibiotics for UTI at this time.  Troponin initially negative.  Magnesium normal.  Will check for Covid.  Chest x-ray shows no pneumonia.  Due to his new anticoagulant use, hypotension, near syncope, and occult rectal bleeding, patient will be admitted for further hemoglobin trending and monitoring in case he needs blood transfusion.  As he is not hypotensive on my reassessment while he is not  trying to stand, will hold on blood transfusion initially.  Will call GI to see patient.  Patient reports he does take ibuprofen, suspect possible upper GI bleed although he has no nausea, vomiting, or vomiting blood.  Anticipate following up on GI recs and patient will be admitted.  GI called back and recommended IV Protonix.  They will see the patient in the morning.  Patient will be admitted to medicine service for further monitoring.    Final Clinical Impression(s) / ED Diagnoses Final diagnoses:  Symptomatic anemia  Gastrointestinal hemorrhage, unspecified gastrointestinal hemorrhage type     Clinical Impression: 1. Symptomatic anemia   2. Gastrointestinal hemorrhage, unspecified gastrointestinal hemorrhage type     Disposition: Admit  This note was prepared with assistance of Dragon voice recognition software. Occasional wrong-word or sound-a-like substitutions may have occurred due to the inherent limitations of voice recognition software.     Rolondo Pierre, Gwenyth Allegra, MD 08/13/19 [phone]

## 2019-08-14 ENCOUNTER — Inpatient Hospital Stay (HOSPITAL_COMMUNITY): Payer: Medicare Other | Admitting: Certified Registered Nurse Anesthetist

## 2019-08-14 ENCOUNTER — Inpatient Hospital Stay (HOSPITAL_COMMUNITY): Payer: Medicare Other

## 2019-08-14 ENCOUNTER — Encounter (HOSPITAL_COMMUNITY)
Admission: EM | Disposition: A | Discharge status: Home / Self Care | Payer: Self-pay | Source: Home / Self Care | Attending: Family Medicine

## 2019-08-14 ENCOUNTER — Encounter (HOSPITAL_COMMUNITY): Payer: Self-pay | Admitting: Family Medicine

## 2019-08-14 ENCOUNTER — Other Ambulatory Visit: Payer: Self-pay

## 2019-08-14 DIAGNOSIS — K31811 Angiodysplasia of stomach and duodenum with bleeding: Secondary | ICD-10-CM

## 2019-08-14 DIAGNOSIS — I4891 Unspecified atrial fibrillation: Secondary | ICD-10-CM

## 2019-08-14 DIAGNOSIS — I2510 Atherosclerotic heart disease of native coronary artery without angina pectoris: Secondary | ICD-10-CM

## 2019-08-14 DIAGNOSIS — K922 Gastrointestinal hemorrhage, unspecified: Secondary | ICD-10-CM

## 2019-08-14 DIAGNOSIS — G894 Chronic pain syndrome: Secondary | ICD-10-CM

## 2019-08-14 HISTORY — PX: ESOPHAGOGASTRODUODENOSCOPY (EGD) WITH PROPOFOL: SHX5813

## 2019-08-14 HISTORY — PX: HOT HEMOSTASIS: SHX5433

## 2019-08-14 LAB — POCT I-STAT, CHEM 8
BUN: 75 mg/dL — ABNORMAL HIGH (ref 8–23)
Calcium, Ion: 1.3 mmol/L (ref 1.15–1.40)
Chloride: 110 mmol/L (ref 98–111)
Creatinine, Ser: 1.3 mg/dL — ABNORMAL HIGH (ref 0.61–1.24)
Glucose, Bld: 130 mg/dL — ABNORMAL HIGH (ref 70–99)
HCT: 24 % — ABNORMAL LOW (ref 39.0–52.0)
Hemoglobin: 8.2 g/dL — ABNORMAL LOW (ref 13.0–17.0)
Potassium: 4.1 mmol/L (ref 3.5–5.1)
Sodium: 140 mmol/L (ref 135–145)
TCO2: 23 mmol/L (ref 22–32)

## 2019-08-14 LAB — BASIC METABOLIC PANEL
Anion gap: 12 (ref 5–15)
BUN: 81 mg/dL — ABNORMAL HIGH (ref 8–23)
CO2: 16 mmol/L — ABNORMAL LOW (ref 22–32)
Calcium: 9.1 mg/dL (ref 8.9–10.3)
Chloride: 112 mmol/L — ABNORMAL HIGH (ref 98–111)
Creatinine, Ser: 1.41 mg/dL — ABNORMAL HIGH (ref 0.61–1.24)
GFR calc Af Amer: 57 mL/min — ABNORMAL LOW (ref >60)
GFR calc non Af Amer: 49 mL/min — ABNORMAL LOW (ref >60)
Glucose, Bld: 124 mg/dL — ABNORMAL HIGH (ref 70–99)
Potassium: 5.2 mmol/L — ABNORMAL HIGH (ref 3.5–5.1)
Sodium: 140 mmol/L (ref 135–145)

## 2019-08-14 LAB — CBC
HCT: 25.9 % — ABNORMAL LOW (ref 39.0–52.0)
Hemoglobin: 8.3 g/dL — ABNORMAL LOW (ref 13.0–17.0)
MCH: 31.2 pg (ref 26.0–34.0)
MCHC: 32 g/dL (ref 30.0–36.0)
MCV: 97.4 fL (ref 80.0–100.0)
Platelets: 199 10*3/uL (ref 150–400)
RBC: 2.66 MIL/uL — ABNORMAL LOW (ref 4.22–5.81)
RDW: 16.6 % — ABNORMAL HIGH (ref 11.5–15.5)
WBC: 9.8 10*3/uL (ref 4.0–10.5)
nRBC: 1.3 % — ABNORMAL HIGH (ref 0.0–0.2)

## 2019-08-14 LAB — PROTIME-INR
INR: 1.5 — ABNORMAL HIGH (ref 0.8–1.2)
Prothrombin Time: 17.8 seconds — ABNORMAL HIGH (ref 11.4–15.2)

## 2019-08-14 IMAGING — CT CT ABD-PELV W/ CM
2 of 5 series · 15 of 46 positions shown, 17 images · IV contrast (APPLIED)
Comparison: [DATE]

CLINICAL DATA: Cirrhosis

EXAM:
CT ABDOMEN AND PELVIS WITH CONTRAST
TECHNIQUE: Multidetector CT imaging of the abdomen and pelvis was performed
using the standard protocol following bolus administration of
intravenous contrast.
CONTRAST:  100mL OMNIPAQUE IOHEXOL 300 MG/ML  SOLN

[Series 3: abdomen 5.0 · axial · 0.98mm/px · z∈[-419,-19]mm · 12 of 92 slices shown, 14 images]
[im 6/92  soft-tissue]
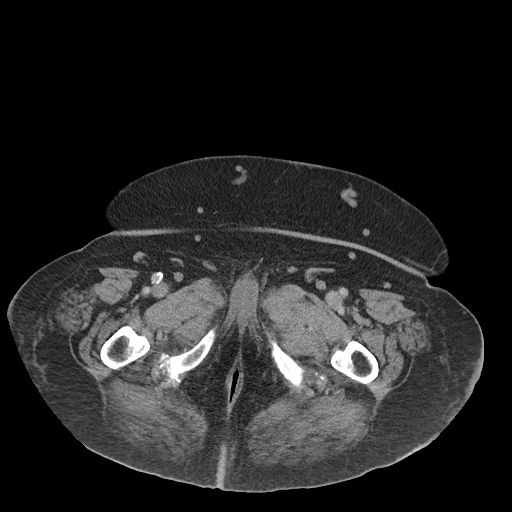
[im 6/92  bone]
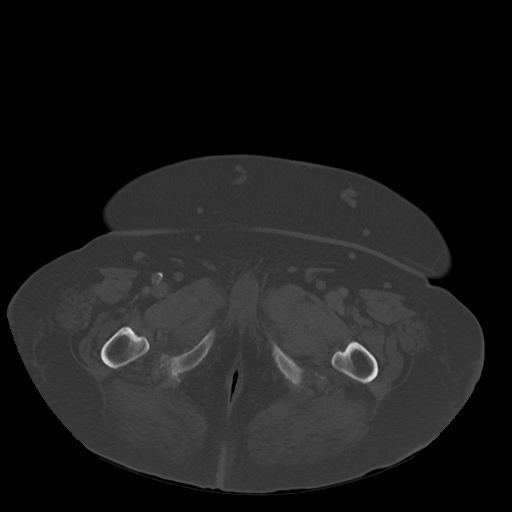
[im 12/92  soft-tissue]
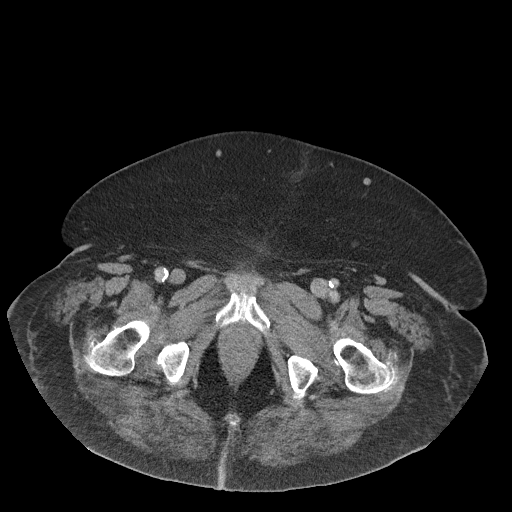
[im 23/92  soft-tissue]
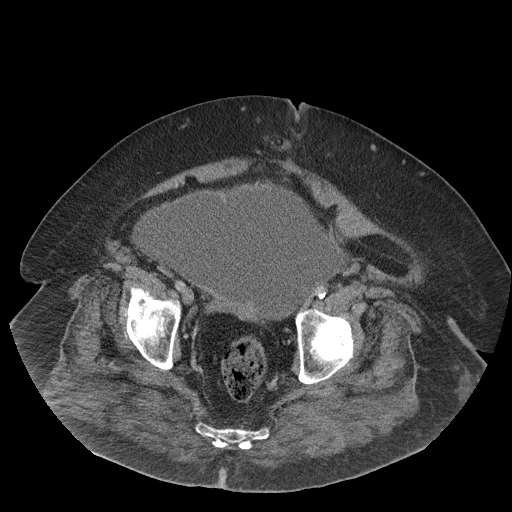
[im 29/92  soft-tissue]
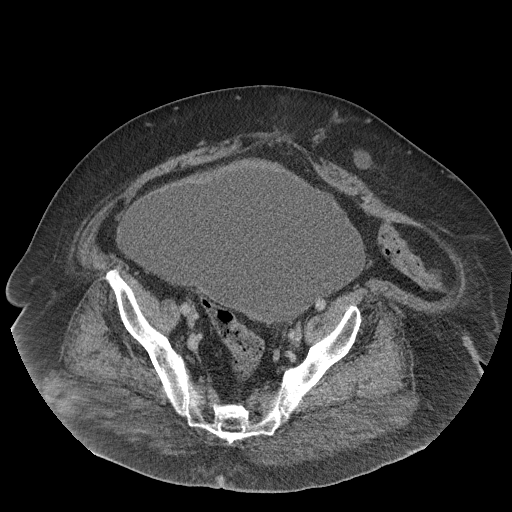
[im 35/92  soft-tissue]
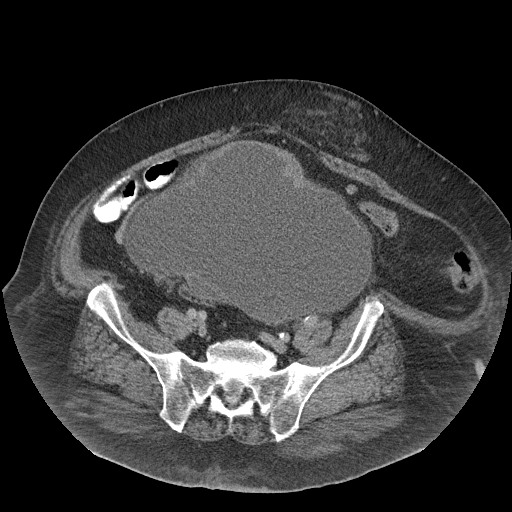
[im 40/92  soft-tissue]
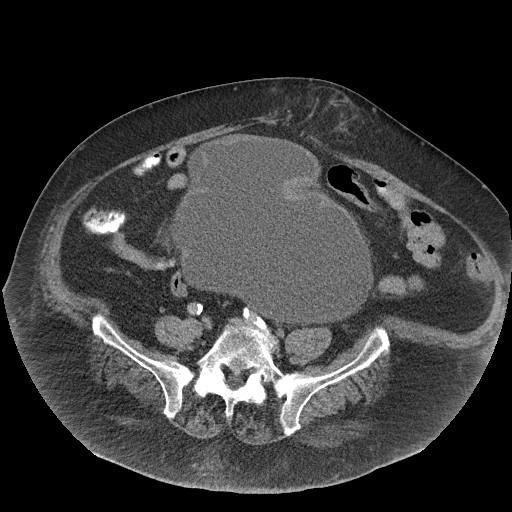
[im 52/92  soft-tissue]
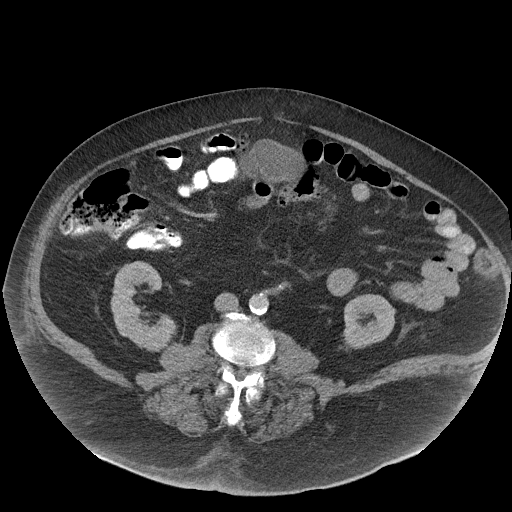
[im 57/92  soft-tissue]
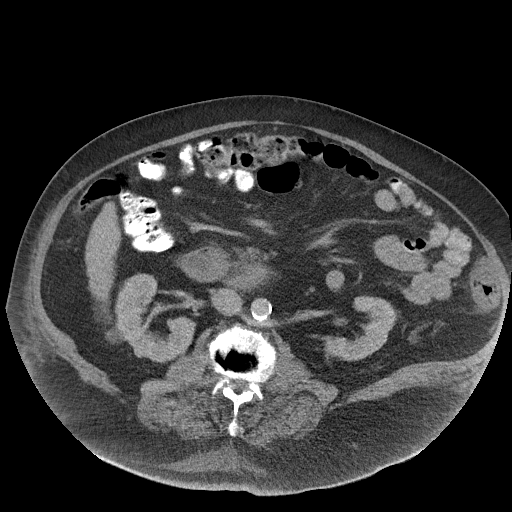
[im 63/92  soft-tissue]
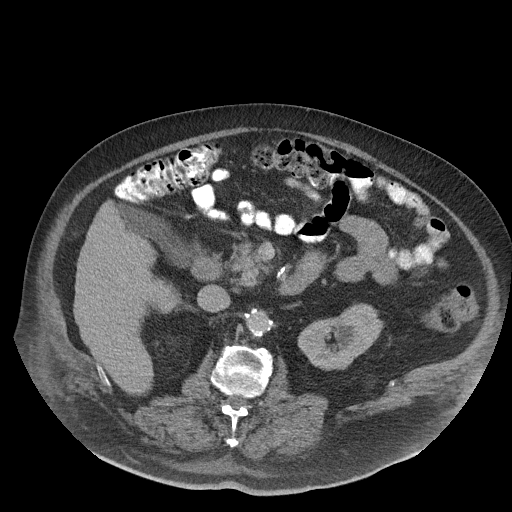
[im 63/92  bone]
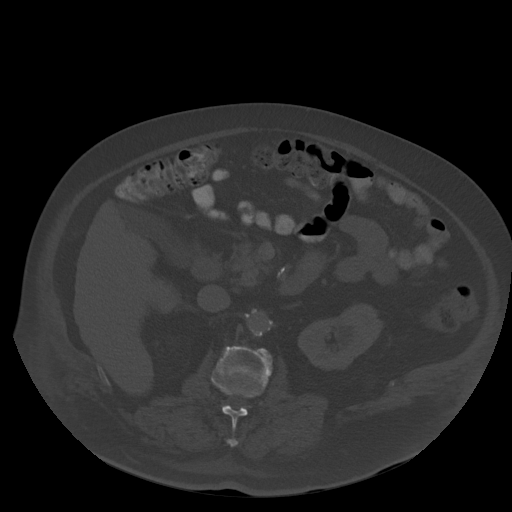
[im 69/92  soft-tissue]
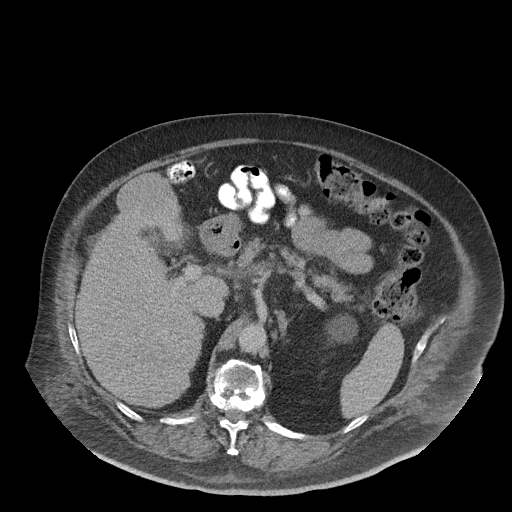
[im 80/92  soft-tissue]
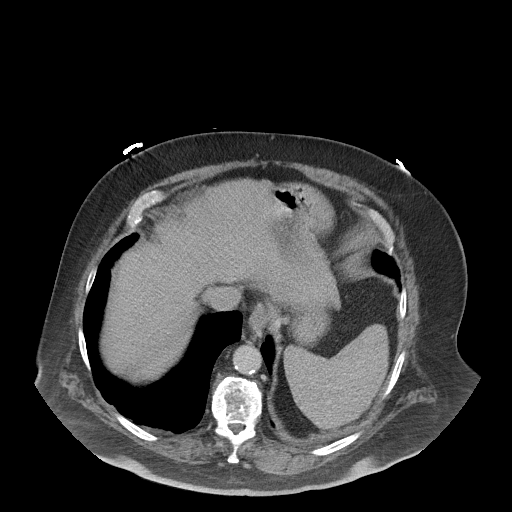
[im 86/92  soft-tissue]
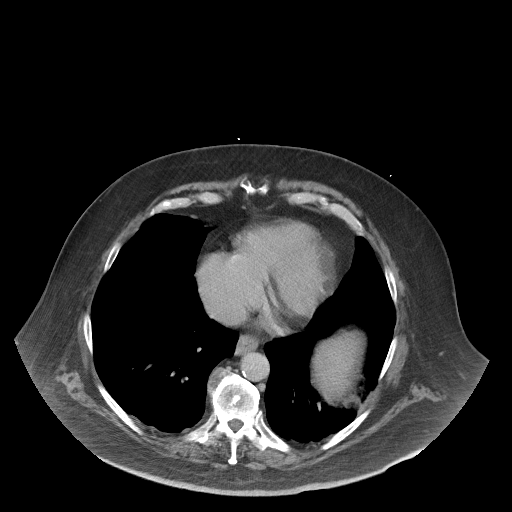

[Series 6: abdomen 3.0 mpr cor · coronal · 0.90mm/px · 3 of 138 slices shown]
[im 46/138  soft-tissue]
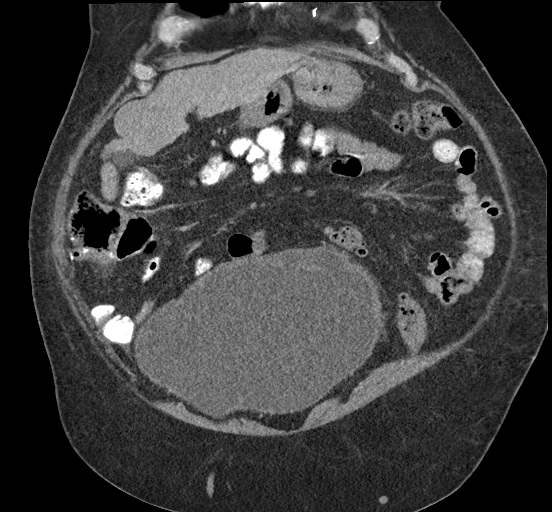
[im 61/138  soft-tissue]
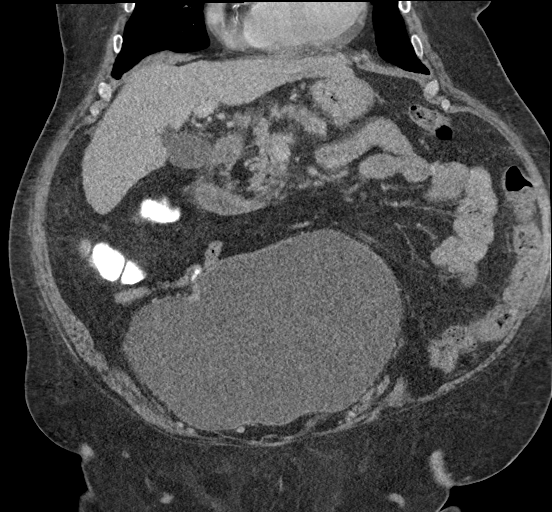
[im 77/138  soft-tissue]
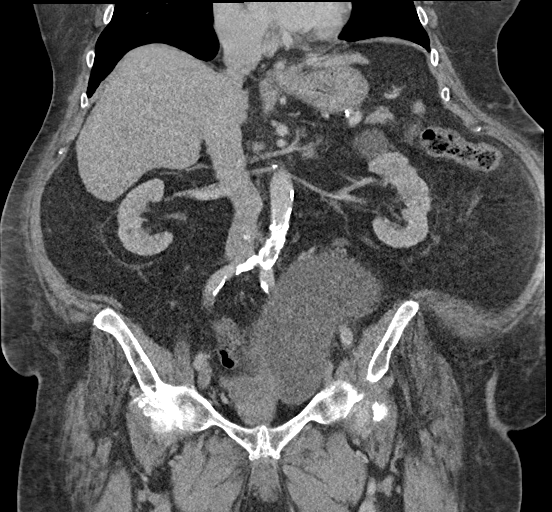

[15 of 46 positions shown; findings below may reference images not displayed]

FINDINGS: Lower chest: There is atelectasis versus scarring at the lung bases
bilaterally.The heart size is normal.

Hepatobiliary: Again noted are findings of cirrhosis. There is an
ill-defined masslike area involving hepatic segment 4 B (axial
series 3, image 23). This appears relatively stable when compared to
prior study. There may be a small calcifications centered within
this mass. Cholelithiasis without acute inflammation.There is no
biliary ductal dilation.

Pancreas: Normal contours without ductal dilatation. No
peripancreatic fluid collection.

Spleen: The spleen is enlarged measuring approximately 13 cm
craniocaudad.

Adrenals/Urinary Tract:

--Adrenal glands: No adrenal hemorrhage.

--Right kidney/ureter: No hydronephrosis or perinephric hematoma.

--Left kidney/ureter: No hydronephrosis or perinephric hematoma.

--Urinary bladder: There is severe diffuse enlargement of the
urinary bladder with bladder wall trabeculation.

Stomach/Bowel:

--Stomach/Duodenum: No hiatal hernia or other gastric abnormality.
Normal duodenal course and caliber.

--Small bowel: No dilatation or inflammation.

--Colon: Rectosigmoid diverticulosis without acute inflammation.

--Appendix: Not visualized. No right lower quadrant inflammation or
free fluid.

Vascular/Lymphatic: Atherosclerotic calcification is present within
the non-aneurysmal abdominal aorta, without hemodynamically
significant stenosis. Esophageal varices are noted. There is a
retroaortic left renal vein, a normal variant.

--No retroperitoneal lymphadenopathy.

--No mesenteric lymphadenopathy.

--No pelvic or inguinal lymphadenopathy.

Reproductive: The patient appears to be status post prostatectomy.

Other: There is a small volume of free fluid in the patient's
abdomen. Multiple midline fat containing abdominal wall hernias are
noted.

Musculoskeletal. No acute displaced fractures.
IMPRESSION: 1. Again noted are findings of cirrhosis with stigmata of portal
hypertension as seen on the patient's prior CT.
2. There is an ill-defined 5.9 cm hepatic mass as detailed above.
Given its stability from prior study, this is favored to represent a
benign process such as a hepatic hemangioma. However, this is
incompletely characterized on this exam. Follow-up with a
nonemergent outpatient liver mass protocol MRI is recommended for
further evaluation.
3. Small volume ascites.  Esophageal varices are noted.
4. Again noted is a severely distended urinary bladder with bladder
wall trabeculation. Findings are favored to be secondary to a
chronic outlet obstruction.
5. There is cholelithiasis without secondary signs of acute
cholecystitis.
6.  Aortic Atherosclerosis ([JF]-[JF]).

## 2019-08-14 SURGERY — ESOPHAGOGASTRODUODENOSCOPY (EGD) WITH PROPOFOL
Anesthesia: Monitor Anesthesia Care

## 2019-08-14 MED ORDER — PANTOPRAZOLE SODIUM 40 MG IV SOLR: 40.0000 mg | Freq: Two times a day (BID) | INTRAVENOUS | Status: DC

## 2019-08-14 MED ORDER — EPHEDRINE SULFATE 50 MG/ML IJ SOLN
INTRAMUSCULAR | Status: DC | PRN
  Administered 2019-08-14: 15 mg via INTRAVENOUS
  Administered 2019-08-14: 5 mg via INTRAVENOUS
  Administered 2019-08-14 (×2): 10 mg via INTRAVENOUS
  Administered 2019-08-14: 5 mg via INTRAVENOUS
  Administered 2019-08-14 (×3): 10 mg via INTRAVENOUS

## 2019-08-14 MED ORDER — PROPOFOL 500 MG/50ML IV EMUL
INTRAVENOUS | Status: DC | PRN
  Administered 2019-08-14: 100 ug/kg/min via INTRAVENOUS
  Administered 2019-08-14: 50 ug/kg/min via INTRAVENOUS

## 2019-08-14 MED ORDER — SODIUM CHLORIDE 0.9 % IV SOLN
8.0000 mg/h | INTRAVENOUS | Status: DC
  Administered 2019-08-14: 8 mg/h via INTRAVENOUS
  Filled 2019-08-14: qty 80

## 2019-08-14 MED ORDER — PHENYLEPHRINE HCL (PRESSORS) 10 MG/ML IV SOLN
INTRAVENOUS | Status: DC | PRN
  Administered 2019-08-14: 240 ug via INTRAVENOUS
  Administered 2019-08-14: 280 ug via INTRAVENOUS
  Administered 2019-08-14: 300 ug via INTRAVENOUS
  Administered 2019-08-14: 160 ug via INTRAVENOUS
  Administered 2019-08-14: 80 ug via INTRAVENOUS
  Administered 2019-08-14: 200 ug via INTRAVENOUS
  Administered 2019-08-14: 120 ug via INTRAVENOUS

## 2019-08-14 MED ORDER — CALCIUM CHLORIDE 10 % IV SOLN
INTRAVENOUS | Status: DC | PRN
  Administered 2019-08-14 (×2): .3 g via INTRAVENOUS
  Administered 2019-08-14: .4 g via INTRAVENOUS

## 2019-08-14 MED ORDER — PROPOFOL 500 MG/50ML IV EMUL: INTRAVENOUS | Status: DC | PRN

## 2019-08-14 MED ORDER — IOHEXOL 300 MG/ML  SOLN
100.0000 mL | Freq: Once | INTRAMUSCULAR | Status: AC | PRN
  Administered 2019-08-14: 100 mL via INTRAVENOUS

## 2019-08-14 MED ORDER — PANTOPRAZOLE SODIUM 40 MG IV SOLR
40.0000 mg | Freq: Two times a day (BID) | INTRAVENOUS | Status: DC
  Administered 2019-08-14 – 2019-08-15 (×3): 40 mg via INTRAVENOUS
  Filled 2019-08-14 (×3): qty 40

## 2019-08-14 MED ORDER — PHENYLEPHRINE HCL-NACL 10-0.9 MG/250ML-% IV SOLN
INTRAVENOUS | Status: DC | PRN
  Administered 2019-08-14: 100 ug/min via INTRAVENOUS

## 2019-08-14 SURGICAL SUPPLY — 14 items

## 2019-08-14 NOTE — Anesthesia Preprocedure Evaluation (Addendum)
Anesthesia Evaluation  Patient identified by MRN, date of birth, ID band Patient awake    Reviewed: Allergy & Precautions, H&P , NPO status , Patient's Chart, lab work & pertinent test results  Airway Mallampati: II  TM Distance: >3 FB Neck ROM: Full    Dental no notable dental hx. (+) Poor Dentition, Dental Advisory Given   Pulmonary neg pulmonary ROS, former smoker,    Pulmonary exam normal breath sounds clear to auscultation       Cardiovascular + CAD and + CABG  + dysrhythmias Atrial Fibrillation  Rhythm:Regular Rate:Normal     Neuro/Psych negative neurological ROS  negative psych ROS   GI/Hepatic negative GI ROS, Neg liver ROS,   Endo/Other  diabetes, Type 2, Oral Hypoglycemic AgentsMorbid obesity  Renal/GU negative Renal ROS  negative genitourinary   Musculoskeletal   Abdominal   Peds  Hematology  (+) Blood dyscrasia, anemia ,   Anesthesia Other Findings   Reproductive/Obstetrics negative OB ROS                            Anesthesia Physical Anesthesia Plan  ASA: III  Anesthesia Plan: MAC   Post-op Pain Management:    Induction: Intravenous  PONV Risk Score and Plan: 1 and Propofol infusion  Airway Management Planned: Nasal Cannula  Additional Equipment:   Intra-op Plan:   Post-operative Plan:   Informed Consent: I have reviewed the patients History and Physical, chart, labs and discussed the procedure including the risks, benefits and alternatives for the proposed anesthesia with the patient or authorized representative who has indicated his/her understanding and acceptance.   Patient has DNR.  Discussed DNR with patient and Suspend DNR.   Dental advisory given  Plan Discussed with: CRNA  Anesthesia Plan Comments:        Anesthesia Quick Evaluation

## 2019-08-14 NOTE — Anesthesia Postprocedure Evaluation (Signed)
Anesthesia [name] Note  Patient: [name]  Procedure(s) Performed: ESOPHAGOGASTRODUODENOSCOPY (EGD) WITH PROPOFOL (N/A ) HOT HEMOSTASIS (ARGON PLASMA COAGULATION/BICAP) (N/A )     Patient location during evaluation: Endoscopy Anesthesia Type: MAC Level of consciousness: awake and alert Pain management: pain level controlled Vital Signs Assessment: [name]-procedure vital signs reviewed and stable Respiratory status: spontaneous breathing, nonlabored ventilation and respiratory function stable Cardiovascular status: stable and blood pressure returned to baseline Postop Assessment: no apparent nausea or vomiting Anesthetic complications: no    Last Vitals:  Vitals:   08/14/19 1237 08/14/19 1300  BP: (!) 122/44 (!) 141/60  Pulse: (!) 101 94  Resp: 10 15  Temp:  36.6 C  SpO2: 100% 100%    Last Pain:  Vitals:   08/14/19 1300  TempSrc: Oral  PainSc:                  Harrel Ferrone,W. EDMOND

## 2019-08-14 NOTE — Plan of Care (Signed)
  Problem: Education: Goal: Knowledge of General Education information will improve Description Including pain rating scale, medication(s)/side effects and non-pharmacologic comfort measures Outcome: Progressing   

## 2019-08-14 NOTE — ED Notes (Signed)
Pt ambulated with 2 person assist with the use of a walker to restroom. Pt returned safely to bedside.

## 2019-08-14 NOTE — Anesthesia Procedure Notes (Signed)
Procedure Name: MAC Date/Time: 08/14/2019 11:36 AM Performed by: Oletta Lamas, CRNA Pre-anesthesia Checklist: Emergency Drugs available, Patient identified, Patient being monitored and Suction available Patient Re-evaluated:Patient Re-evaluated prior to induction Oxygen Delivery Method: Nasal cannula

## 2019-08-14 NOTE — ED Notes (Signed)
Patient transported to Endo 

## 2019-08-14 NOTE — Op Note (Signed)
[facility] Patient Name: Brandon [name] Procedure Date : 08/14/2019 MRN: [id number] Attending MD: [name] , MD Date of Birth: [birth year] CSN: [id number] Age: 74 Admit Type: Emergency Department Procedure:                Upper GI endoscopy Indications:              Acute post hemorrhagic anemia, Heme positive stool Providers:                Lajuan Lines. Hilarie Fredrickson, MD, Glori Bickers, RN, Laverda Sorenson,                            Technician, Gala Lewandowsky, CRNA Referring MD:             [facility] Medicine Teaching Service Medicines:                Monitored Anesthesia Care Complications:            No immediate complications. Estimated Blood Loss:     Estimated blood loss: none. Procedure:                Pre-Anesthesia Assessment:                           - Prior to the procedure, a History and Physical                            was performed, and patient medications and                            allergies were reviewed. The patient's tolerance of                            previous anesthesia was also reviewed. The risks                            and benefits of the procedure and the sedation                            options and risks were discussed with the patient.                            All questions were answered, and informed consent                            was obtained. Prior Anticoagulants: The patient has                            taken Xarelto (rivaroxaban), last dose was 1 day                            prior to procedure. ASA Grade Assessment: III - A                            patient with severe systemic disease. After  reviewing the risks and benefits, the patient was                            deemed in satisfactory condition to undergo the                            procedure.                           After obtaining informed consent, the endoscope was                            passed under direct vision. Throughout the                           procedure, the patient's blood pressure, pulse, and                            oxygen saturations were monitored continuously. The                            [id number]) Olympus gastroscope was                            introduced through the mouth, and advanced to the                            second part of duodenum. The upper GI endoscopy was                            accomplished without difficulty. The patient                            tolerated the procedure well. Scope In: Scope Out: Findings:      The examined esophagus was normal. No evidence of esophageal varices.      The gastroesophageal flap valve was visualized endoscopically and       classified as Hill Grade III (minimal fold, loose to endoscope, hiatal       hernia likely).      Hematin (altered blood/coffee-ground-like material) and clotted blood       was found in the entire examined stomach. Copious irrigation lavage was       performed to better examine the gastric mucosa.      A single 2 mm angioectasia with bleeding was found on the greater       curvature of the gastric body. Fulguration to stop the bleeding by argon       plasma at 1 liter/minute and 20 watts was successful.      The examined duodenum was normal. Impression:               - Normal esophagus.                           - Gastroesophageal flap valve classified as Hill  Grade III (minimal fold, loose to endoscope, hiatal                            hernia likely).                           - Hematin (altered blood/coffee-ground-like                            material) in the entire stomach.                           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                            Treated with argon plasma coagulation (APC).                           - Normal examined duodenum.                           - No specimens collected. Moderate Sedation:      N/A Recommendation:            - Return patient to hospital ward for ongoing care.                           - Advance diet as tolerated.                           - Continue present medications, but hold Xarelto at                            this time.                           - Twice daily PPI for a week, daily thereafter                            particularly if NSAID use continues                           - Avoid ibuprofen, naproxen, or other non-steroidal                            anti-inflammatory drugs.                           - Monitor hemoglobin closely after transfusion.                           - CT abd/pelvis given possible history of cirrhosis.                           - Outpatient surveillance colonoscopy is  recommended. Procedure Code(s):        --- Professional ---                           [id number], Esophagogastroduodenoscopy, flexible,                            transoral; with control of bleeding, any method Diagnosis Code(s):        --- Professional ---                           K44.9, Diaphragmatic hernia without obstruction or                            gangrene                           K92.2, Gastrointestinal hemorrhage, unspecified                           K31.811, Angiodysplasia of stomach and duodenum                            with bleeding                           D62, Acute posthemorrhagic anemia                           R19.5, Other fecal abnormalities CPT copyright 2019 American Medical Association. All rights reserved. The codes documented in this report are preliminary and upon coder review may  be revised to meet current compliance requirements. [name], MD 08/14/2019 12:23:58 PM This report has been signed electronically. Number of Addenda: 0

## 2019-08-14 NOTE — Consult Note (Signed)
.   Referring Provider:  Josephine Igo, MD (Family Medicine Teaching Service)         Primary Care Physician:  Helane Rima, MD Primary Gastroenterologist: none, previously in [place], [place]            Reason for Consultation: acute GI bleeding, acute anemia                  ASSESSMENT /  PLAN   74 y.o. male with a history of colon cancer diagnosed in 1999 with surgical resection, CAD, atrial fibrillation with recent addition of Xarelto, chronic arthritis and back pain on chronic NSAIDs, history of alcohol abuse with no use in the last 9 months, obesity, diabetes who presents to the ER with weakness symptomatic anemia.  1.  Recent GI bleed/acute posthemorrhagic anemia --I am suspicious for a recent upper GI bleed given precipitous drop in hemoglobin over the last 9 days.  Stools are clearly heme positive.  All in the setting of recently started Xarelto and heavy NSAID use.  Concern is gastric ulcer.  His INR was elevated at 2.6 for unclear reason, I am repeating this stat.  He has received 2 units of red cells.  Emergent upper endoscopy is recommended. --I have changed him to PPI infusion given concern for upper GI bleeding --I repeated INR stat --EGD recommended, we discussed the risk, benefits and alternatives and he is agreeable and wishes to proceed --Follow-up hemoglobin posttransfusion --Maintain 2 peripheral IVs   2.  History of colon cancer --history of colon cancer in 1999 with last colonoscopy reportedly 2009.  He is clearly overdue for surveillance.  We are proceeding with upper endoscopy and if no source is found he will need colonoscopy.  If we find a source on the EGD he needs outpatient surveillance colonoscopy as soon as he recovers from acute admission and is willing.  3.  History of alcohol use --he reports never having been told he has cirrhosis.  Abdominal imaging is reasonable while he is here.  Given body habitus would consider CT scan rather than ultrasound to evaluate  the liver parenchyma.  Monitor for any evidence of withdrawal though he reports having been quit alcohol for 9 months or more.  4.  AKI --expect due to volume depletion in the setting of GI bleed.  He is getting IV fluids and creatinine will be monitored  5.  Atrial fibrillation --on Xarelto though this was held given GI bleeding.    HPI:     Brandon [name] is a 74 y.o. male with a history of colon cancer diagnosed in 1999 with surgical resection, CAD, atrial fibrillation with recent addition of Xarelto, chronic arthritis and back pain on chronic NSAIDs, history of alcohol abuse with no use in the last 9 months, obesity, diabetes who presents to the ER with weakness symptomatic anemia.  He reports that over the last several days he has felt weak and dizzy with no energy.  He denies seeing red blood or hematochezia.  Also denies melena though stools are dark with oral iron administration chronically.  He denies abdominal pain.  Denies reflux.  Omeprazole is on his medication list and he believes he was taking it, dose unclear.  He does use ibuprofen for back and arthritic pain in his feet and legs.  He takes 2 to 3 tablets 4-6 times a day and reports "I really do not count".  He currently denies chest pain and shortness of breath.  His back hurts lying in  stretcher bed.  He has a history of colon cancer which dates back to 1999.  Treated in [place].  He recalls this being removed surgically.  He had subsequent colonoscopies but none since 2009.  He moved from [place] to get away from the cold weather and snow and wound up here as his son lives in [place].  He does have a history of heavy alcohol abuse but reports none in the last 9 months.  He does not recall ever being told he had cirrhosis.  He is a former smoker but quit many years ago.  On presentation his hemoglobin was 7.9 and 4 hours later at 2055 yesterday was 6.7.  He received 2 units of red cells.  Hemoglobin was 13 when measured as an  outpatient on 08/05/2019.  White count was mildly elevated at 10.5.  MCV was 100.8.  Platelets normal.  His INR was elevated at 2.6.  Creatinine was a bit above baseline at 1.4 having previously been 1.08 1 week ago.  BUN was elevated at 81.  His COVID-19 swab was negative   Past Medical History:  Diagnosis Date  . Cancer ([place])   . Colon cancer ([place])   . Coronary artery disease     Past Surgical History:  Procedure Laterality Date  . APPLICATION OF WOUND VAC Right 09/30/2018   Procedure: Application Of Wound Vac;  Surgeon: Altamese Glidden, MD;  Location: [facility];  Service: Orthopedics;  Laterality: Right;  . CORONARY ARTERY BYPASS GRAFT    . PATELLAR TENDON REPAIR Right 09/30/2018   Procedure: PATELLA TENDON REPAIR;  Surgeon: Altamese Gila, MD;  Location: [facility];  Service: Orthopedics;  Laterality: Right;    Prior to Admission medications   Medication Sig Start Date End Date Taking? Authorizing Provider  allopurinol (ZYLOPRIM) 300 MG tablet Take 300 mg by mouth at bedtime.  08/12/18  Yes [provider]  amLODipine (NORVASC) 5 MG tablet Take 5 mg by mouth daily.  02/18/18  Yes [provider]  aspirin EC 81 MG tablet Take 81 mg by mouth daily.   Yes [provider]  cholecalciferol (VITAMIN D3) 25 MCG (1000 UT) tablet Take 2 tablets (2,000 Units total) by mouth 2 (two) times daily. Patient taking differently: Take 2,000 Units by mouth daily.  10/08/18  Yes [name] C, DO  Difluprednate (DUREZOL) 0.05 % EMUL Place 1 drop into the right eye 4 (four) times daily.   Yes [provider]  ferrous sulfate 325 (65 FE) MG tablet Take 325 mg by mouth daily. 11/15/15 11/15/19 Yes [provider]  finasteride (PROSCAR) 5 MG tablet Take 5 mg by mouth daily. 02/18/18  Yes [provider]  gatifloxacin (ZYMAXID) 0.5 % SOLN Place 1 drop into the right eye 4 (four) times daily.   Yes [provider]  ibuprofen (ADVIL) 200 MG tablet Take 400-600  mg by mouth 5 (five) times daily as needed (pain).   Yes [provider]  ketorolac (ACULAR) 0.5 % ophthalmic solution Place 1 drop into the right eye 4 (four) times daily. 08/13/19  Yes [provider]  lisinopril (ZESTRIL) 40 MG tablet Take 40 mg by mouth daily.   Yes [provider]  MAGNESIUM PO Take 1 tablet by mouth daily.   Yes [provider]  metFORMIN (GLUCOPHAGE-XR) 500 MG 24 hr tablet Take 500 mg by mouth daily with breakfast.  02/18/18  Yes [provider]  Multiple Vitamin (MULTIVITAMIN WITH MINERALS) TABS tablet Take  1 tablet by mouth daily.   Yes [provider]  NONFORMULARY OR COMPOUNDED ITEM Apply 1 application topically 2 (two) times daily as needed (rash). Triamcinolone 0.1% and Aquaphor compound steroid cream, 3:1 ratio, 1 pound jar 07/14/18  Yes [provider]  omeprazole (PRILOSEC) 10 MG capsule Take 10 mg by mouth daily. 02/18/18  Yes [provider]  polyethylene glycol (MIRALAX / GLYCOLAX) packet Take 17 g by mouth 2 (two) times daily. Patient taking differently: Take 17 g by mouth daily. Mix in coffee and drink 10/08/18  Yes [name] C, DO  polyvinyl alcohol (ARTIFICIAL TEARS) 1.4 % ophthalmic solution Place 1 drop into the left eye 3 (three) times daily.   Yes [provider]  pravastatin (PRAVACHOL) 10 MG tablet Take 10 mg by mouth at bedtime. 02/18/18  Yes [provider]  senna-docusate (SENOKOT-S) 8.6-50 MG tablet Take 1 tablet by mouth 2 (two) times daily. Patient taking differently: Take 1 tablet by mouth daily.  10/08/18  Yes [name] C, DO  tamsulosin (FLOMAX) 0.4 MG CAPS capsule Take 0.4 mg by mouth at bedtime. 02/18/18  Yes [provider]  vitamin C (VITAMIN C) 1000 MG tablet Take 1 tablet (1,000 mg total) by mouth daily. 10/09/18  Yes Anderson, Hannah C, DO  XARELTO STARTER PACK 15 & 20 MG TBPK Take 15-20 mg by mouth See admin instructions. Start date  08/12/2019- take 15 mg twice daily with food for 21 days, then take 20 mg daily with food. 08/08/19  Yes [provider]  folic acid (FOLVITE) 1 MG tablet Take 1 tablet (1 mg total) by mouth daily. Patient not taking: Reported on 08/13/2019 10/09/18   [name] C, DO  magnesium oxide (MAG-OX) 400 (241.3 Mg) MG tablet Take 1 tablet (400 mg total) by mouth daily. Patient not taking: Reported on 08/13/2019 08/25/18   [name], MD  thiamine 100 MG tablet Take 1 tablet (100 mg total) by mouth daily. Patient not taking: Reported on 08/13/2019 10/09/18   [name], DO    Current Facility-Administered Medications  Medication Dose Route Frequency Provider Last Rate Last Admin  . 0.9 %  sodium chloride infusion   Intravenous Continuous Bonnita Hollow, MD   Stopped at 08/14/19 [phone]  . acetaminophen (TYLENOL) tablet 650 mg  650 mg Oral Q6H PRN Bonnita Hollow, MD   650 mg at 08/14/19 0503   Or  . acetaminophen (TYLENOL) suppository 650 mg  650 mg Rectal Q6H PRN Bonnita Hollow, MD      . diclofenac Sodium (VOLTAREN) 1 % topical gel 2 g  2 g Topical QID PRN Bonnita Hollow, MD   2 g at 08/14/19 0609  . finasteride (PROSCAR) tablet 5 mg  5 mg Oral Daily Bonnita Hollow, MD   5 mg at 08/13/19 2317  . lidocaine (LIDODERM) 5 % 1 patch  1 patch Transdermal Q24H Bonnita Hollow, MD   1 patch at 08/13/19 2050  . pantoprazole (PROTONIX) injection 40 mg  40 mg Intravenous Q12H Bonnita Hollow, MD      . tamsulosin [facility]) capsule 0.4 mg  0.4 mg Oral QHS Bonnita Hollow, MD   0.4 mg at 08/13/19 2225   Current Outpatient Medications  Medication Sig Dispense Refill  . allopurinol (ZYLOPRIM) 300 MG tablet Take 300 mg by mouth at bedtime.     Marland Kitchen amLODipine (NORVASC) 5 MG tablet Take 5 mg by mouth daily.     Marland Kitchen  aspirin EC 81 MG tablet Take 81 mg by mouth daily.    . cholecalciferol (VITAMIN D3) 25 MCG (1000 UT) tablet Take 2 tablets (2,000 Units total) by mouth 2 (two) times  daily. (Patient taking differently: Take 2,000 Units by mouth daily. ) 60 tablet 0  . Difluprednate (DUREZOL) 0.05 % EMUL Place 1 drop into the right eye 4 (four) times daily.    . ferrous sulfate 325 (65 FE) MG tablet Take 325 mg by mouth daily.    . finasteride (PROSCAR) 5 MG tablet Take 5 mg by mouth daily.    Marland Kitchen gatifloxacin (ZYMAXID) 0.5 % SOLN Place 1 drop into the right eye 4 (four) times daily.    Marland Kitchen ibuprofen (ADVIL) 200 MG tablet Take 400-600 mg by mouth 5 (five) times daily as needed (pain).    Marland Kitchen ketorolac (ACULAR) 0.5 % ophthalmic solution Place 1 drop into the right eye 4 (four) times daily.    Marland Kitchen lisinopril (ZESTRIL) 40 MG tablet Take 40 mg by mouth daily.    Marland Kitchen MAGNESIUM PO Take 1 tablet by mouth daily.    . metFORMIN (GLUCOPHAGE-XR) 500 MG 24 hr tablet Take 500 mg by mouth daily with breakfast.     . Multiple Vitamin (MULTIVITAMIN WITH MINERALS) TABS tablet Take 1 tablet by mouth daily.    . NONFORMULARY OR COMPOUNDED ITEM Apply 1 application topically 2 (two) times daily as needed (rash). Triamcinolone 0.1% and Aquaphor compound steroid cream, 3:1 ratio, 1 pound jar    . omeprazole (PRILOSEC) 10 MG capsule Take 10 mg by mouth daily.    . polyethylene glycol (MIRALAX / GLYCOLAX) packet Take 17 g by mouth 2 (two) times daily. (Patient taking differently: Take 17 g by mouth daily. Mix in coffee and drink) 14 each 0  . polyvinyl alcohol (ARTIFICIAL TEARS) 1.4 % ophthalmic solution Place 1 drop into the left eye 3 (three) times daily.    . pravastatin (PRAVACHOL) 10 MG tablet Take 10 mg by mouth at bedtime.    . senna-docusate (SENOKOT-S) 8.6-50 MG tablet Take 1 tablet by mouth 2 (two) times daily. (Patient taking differently: Take 1 tablet by mouth daily. ) 30 tablet 0  . tamsulosin (FLOMAX) 0.4 MG CAPS capsule Take 0.4 mg by mouth at bedtime.    . vitamin C (VITAMIN C) 1000 MG tablet Take 1 tablet (1,000 mg total) by mouth daily. 30 tablet 0  . XARELTO STARTER PACK 15 & 20 MG TBPK Take  15-20 mg by mouth See admin instructions. Start date 08/12/2019- take 15 mg twice daily with food for 21 days, then take 20 mg daily with food.    . folic acid (FOLVITE) 1 MG tablet Take 1 tablet (1 mg total) by mouth daily. (Patient not taking: Reported on 08/13/2019) 30 tablet 0  . magnesium oxide (MAG-OX) 400 (241.3 Mg) MG tablet Take 1 tablet (400 mg total) by mouth daily. (Patient not taking: Reported on 08/13/2019) 60 tablet 2  . thiamine 100 MG tablet Take 1 tablet (100 mg total) by mouth daily. (Patient not taking: Reported on 08/13/2019) 30 tablet 0    Allergies as of 08/13/2019  . (No Known Allergies)    Family History  Problem Relation Age of Onset  . Colon cancer Father   . CAD Other   . Colon cancer Other   . Diabetes Neg Hx     Social History   Socioeconomic History  . Marital status: Widowed  Occupational History  . Printer x 40  yrs  Tobacco Use  . Smoking status: Former Smoker    Types: Cigarettes  . Smokeless tobacco: Never Used  Substance and Sexual Activity  . Alcohol use: Yes  . Drug use: Never  . Sexual activity: Not on file  Other Topics Concern  Social History Narrative    Review of Systems: All systems reviewed and negative except where noted in HPI.  Physical Exam: Vital signs in last 24 hours: Temp:  [97.6 F (36.4 C)-98.4 F (36.9 C)] 98 F (36.7 C) (01/17 0747) Pulse Rate:  [86-111] 103 (01/17 0747) Resp:  [16-26] 16 (01/17 0747) BP: (93-133)/(40-80) 133/61 (01/17 0747) SpO2:  [94 %-100 %] 100 % (01/17 0747) Weight:  [147 kg] 147 kg (01/16 1538)   General:   Awake, alert, NAD Psych:  Pleasant, cooperative. Normal mood and affect. Eyes:  Pupils equal, sclera clear, no icterus.    Neck:  Supple; no masses Lungs:  Clear throughout to auscultation.   No wheezes, crackles, or rhonchi.  Heart: Irregularly irregular borderline tachycardic Abdomen:  Soft, obese, umbilical hernia, non-distended, nontender, BS active Rectal:  Deferred  Msk:   Symmetrical without gross deformities. . Neurologic:  Alert and  oriented x4;  grossly normal neurologically. Skin:  Intact, excoriations anterior abdominal wall bilateral upper and lower extremities   Intake/Output from previous day: 01/16 0701 - 01/17 0700 In: 1130 [Blood:630; IV Piggyback:500] Out: -  Intake/Output this shift: Total I/O In: 425.9 [I.V.:40.5; Blood:385.4] Out: -   Lab Results: Recent Labs    08/13/19 1614 08/13/19 2055  WBC 10.5 11.2*  HGB 7.9* 6.7*  HCT 25.1* 21.3*  PLT 221 219   BMET Recent Labs    08/13/19 1614 08/14/19 0610  NA 137 140  K 4.5 5.2*  CL 107 112*  CO2 20* 16*  GLUCOSE 131* 124*  BUN 78* 81*  CREATININE 1.36* 1.41*  CALCIUM 9.4 9.1   LFT Recent Labs    08/13/19 1614  PROT 5.1*  ALBUMIN 2.5*  AST 15  ALT 16  ALKPHOS 67  BILITOT 0.4   PT/INR Recent Labs    08/13/19 2055  LABPROT 27.9*  INR 2.6*   Hepatitis Panel No results for input(s): HEPBSAG, HCVAB, HEPAIGM, HEPBIGM in the last 72 hours.   . CBC Latest Ref Rng & Units 08/13/2019 08/13/2019 10/05/2018  WBC 4.0 - 10.5 K/uL 11.2(H) 10.5 6.1  Hemoglobin 13.0 - 17.0 g/dL 6.7(LL) 7.9(L) 11.5(L)  Hematocrit 39.0 - 52.0 % 21.3(L) 25.1(L) 37.8(L)  Platelets 150 - 400 K/uL 219 221 186    . CMP Latest Ref Rng & Units 08/14/2019 08/13/2019 10/05/2018  Glucose 70 - 99 mg/dL 124(H) 131(H) 107(H)  BUN 8 - 23 mg/dL 81(H) 78(H) 13  Creatinine 0.61 - 1.24 mg/dL 1.41(H) 1.36(H) 0.79  Sodium 135 - 145 mmol/L 140 137 133(L)  Potassium 3.5 - 5.1 mmol/L 5.2(H) 4.5 4.2  Chloride 98 - 111 mmol/L 112(H) 107 98  CO2 22 - 32 mmol/L 16(L) 20(L) 24  Calcium 8.9 - 10.3 mg/dL 9.1 9.4 9.3  Total Protein 6.5 - 8.1 g/dL - 5.1(L) -  Total Bilirubin 0.3 - 1.2 mg/dL - 0.4 -  Alkaline Phos 38 - 126 U/L - 67 -  AST 15 - 41 U/L - 15 -  ALT 0 - 44 U/L - 16 -   Studies/Results: DG Chest Portable 1 View  Result Date: 08/13/2019 CLINICAL DATA:  Hypertension. Shortness of breath. EXAM: PORTABLE  CHEST 1 VIEW COMPARISON:  None. FINDINGS: Postsurgical changes from CABG.  Calcific atherosclerotic disease of the aorta. Cardiomediastinal silhouette is normal. Mediastinal contours appear intact. There is no evidence of focal airspace consolidation, pleural effusion or pneumothorax. Osseous structures are without acute abnormality. Soft tissues are grossly normal. IMPRESSION: 1. No evidence of acute cardiopulmonary abnormality. 2. Calcific atherosclerotic disease of the aorta. Electronically Signed   By: Fidela Salisbury M.D.   On: 08/13/2019 16:46    Active Problems:   Symptomatic anemia   Acute GI bleeding    Lajuan Lines. [facility], M.D. @  08/14/2019, 8:21 AM

## 2019-08-14 NOTE — Transfer of Care (Signed)
Immediate Anesthesia Transfer of Care Note  Patient: Brandon [name]  Procedure(s) Performed: ESOPHAGOGASTRODUODENOSCOPY (EGD) WITH PROPOFOL (N/A ) HOT HEMOSTASIS (ARGON PLASMA COAGULATION/BICAP) (N/A )  Patient Location: Endoscopy Unit  Anesthesia Type:MAC  Level of Consciousness: awake, alert , oriented and patient cooperative  Airway & Oxygen Therapy: Patient Spontanous Breathing  [name]-op Assessment: Report given to RN and [name] -op Vital signs reviewed and stable  [name] vital signs: Reviewed and stable  Last Vitals:  Vitals Value Taken Time  BP 113/54 08/14/19 1225  Temp 36.5 C 08/14/19 1225  Pulse 55 08/14/19 1233  Resp 18 08/14/19 1233  SpO2 99 % 08/14/19 1233  Vitals shown include unvalidated device data.  Last Pain:  Vitals:   08/14/19 1225  TempSrc: Temporal  PainSc: 0-No pain         Complications: No apparent anesthesia complications

## 2019-08-15 ENCOUNTER — Telehealth: Payer: Self-pay | Admitting: *Deleted

## 2019-08-15 ENCOUNTER — Encounter (HOSPITAL_COMMUNITY): Payer: Self-pay | Admitting: Family Medicine

## 2019-08-15 DIAGNOSIS — I482 Chronic atrial fibrillation, unspecified: Secondary | ICD-10-CM

## 2019-08-15 DIAGNOSIS — R932 Abnormal findings on diagnostic imaging of liver and biliary tract: Secondary | ICD-10-CM

## 2019-08-15 DIAGNOSIS — K769 Liver disease, unspecified: Secondary | ICD-10-CM

## 2019-08-15 LAB — CBC
HCT: 22.8 % — ABNORMAL LOW (ref 39.0–52.0)
Hemoglobin: 7.4 g/dL — ABNORMAL LOW (ref 13.0–17.0)
MCH: 31.2 pg (ref 26.0–34.0)
MCHC: 32.5 g/dL (ref 30.0–36.0)
MCV: 96.2 fL (ref 80.0–100.0)
Platelets: 178 10*3/uL (ref 150–400)
RBC: 2.37 MIL/uL — ABNORMAL LOW (ref 4.22–5.81)
RDW: 17.1 % — ABNORMAL HIGH (ref 11.5–15.5)
WBC: 8.6 10*3/uL (ref 4.0–10.5)
nRBC: 1.8 % — ABNORMAL HIGH (ref 0.0–0.2)

## 2019-08-15 LAB — URINE CULTURE

## 2019-08-15 LAB — BASIC METABOLIC PANEL
Anion gap: 10 (ref 5–15)
BUN: 59 mg/dL — ABNORMAL HIGH (ref 8–23)
CO2: 19 mmol/L — ABNORMAL LOW (ref 22–32)
Calcium: 8.9 mg/dL (ref 8.9–10.3)
Chloride: 109 mmol/L (ref 98–111)
Creatinine, Ser: 1.28 mg/dL — ABNORMAL HIGH (ref 0.61–1.24)
GFR calc Af Amer: >60 mL/min (ref >60)
GFR calc non Af Amer: 56 mL/min — ABNORMAL LOW (ref >60)
Glucose, Bld: 128 mg/dL — ABNORMAL HIGH (ref 70–99)
Potassium: 3.7 mmol/L (ref 3.5–5.1)
Sodium: 138 mmol/L (ref 135–145)

## 2019-08-15 LAB — HEMOGLOBIN AND HEMATOCRIT, BLOOD
HCT: 25 % — ABNORMAL LOW (ref 39.0–52.0)
Hemoglobin: 8.4 g/dL — ABNORMAL LOW (ref 13.0–17.0)

## 2019-08-15 LAB — PREPARE RBC (CROSSMATCH)

## 2019-08-15 MED ORDER — SODIUM CHLORIDE 0.9% IV SOLUTION: Freq: Once | INTRAVENOUS | Status: DC

## 2019-08-15 MED ORDER — PANTOPRAZOLE SODIUM 40 MG PO TBEC
40.0000 mg | DELAYED_RELEASE_TABLET | Freq: Two times a day (BID) | ORAL | Status: DC
  Administered 2019-08-15 – 2019-08-16 (×2): 40 mg via ORAL
  Filled 2019-08-15 (×2): qty 1

## 2019-08-15 MED ORDER — PANTOPRAZOLE SODIUM 40 MG PO TBEC: 40.0000 mg | DELAYED_RELEASE_TABLET | Freq: Two times a day (BID) | ORAL | Status: DC

## 2019-08-15 MED ORDER — INFLUENZA VAC A&B SA ADJ QUAD 0.5 ML IM PRSY
0.5000 mL | PREFILLED_SYRINGE | INTRAMUSCULAR | Status: AC
  Administered 2019-08-16: 0.5 mL via INTRAMUSCULAR
  Filled 2019-08-15: qty 0.5

## 2019-08-15 MED ORDER — PNEUMOCOCCAL VAC POLYVALENT 25 MCG/0.5ML IJ INJ
0.5000 mL | INJECTION | INTRAMUSCULAR | Status: AC
  Administered 2019-08-16: 0.5 mL via INTRAMUSCULAR
  Filled 2019-08-15: qty 0.5

## 2019-08-15 NOTE — Telephone Encounter (Signed)
-----   Message from Vena Rua, PA-C sent at 08/15/2019 11:41 AM EST ----- Hi, can ROV for this pt be arranged.  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  I would imagine 4 week or mor fup is fine.  Would call him to figure out the date as he has pndg appts with other medical specialists already set up.  Also uses [employer] for transport so if virtual visit ok, that might be easier.   Thanks, S

## 2019-08-15 NOTE — Progress Notes (Addendum)
Daily Rounding Note  08/15/2019, 10:56 AM  LOS: 2 days   SUBJECTIVE:   Chief complaint:   GI bleed.  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  Cirrhosis. Patient feels well.  When he got up on his feet yesterday he did not feel dizzy.  Still feels weak.  No shortness of breath.  Stool dark chronically in setting of iron.     Patient informs me that back around 1999 when he had his colon resection, he underwent liver biopsy of what was described to him as a "lumpy liver".  Biopsies were benign.  He was not informed of cirrhosis at that time.  OBJECTIVE:         Vital signs in last 24 hours:    Temp:  [97.7 F (36.5 C)-97.9 F (36.6 C)] 97.8 F (36.6 C) (01/18 0526) Pulse Rate:  [90-103] 90 (01/18 0526) Resp:  [10-23] 18 (01/18 0526) BP: (111-141)/(44-71) 121/60 (01/18 0526) SpO2:  [98 %-100 %] 98 % (01/18 0526) Last BM Date: 08/14/19 Filed Weights   08/13/19 1538 08/14/19 1053  Weight: (!) 147 kg (!) 147 kg   General: Obese, looks chronically unwell but not acutely ill. Heart: A. fib, rate 104. Chest: Clear.  No labored breathing or cough Abdomen: Obese.  Soft.  Ventral hernia above umbilicus.  No tenderness.  Active bowel sounds. Extremities: Lower extremity edema without pitting. Neuro/Psych: Alert.  Oriented x3.  No tremors, no gross weakness or deficits.  Fluid speech.  Intake/Output from previous day: 01/17 0701 - 01/18 0700 In: 2946.2 [P.O.:780; I.V.:1780.8; Blood:385.4] Out: 1751 [Urine:1750; Stool:1]   Lab Results: Recent Labs    08/13/19 2055 08/13/19 2055 08/14/19 1046 08/14/19 1105 08/15/19 0734  WBC 11.2*  --  9.8  --  8.6  HGB 6.7*   < > 8.3* 8.2* 7.4*  HCT 21.3*   < > 25.9* 24.0* 22.8*  PLT 219  --  199  --  178   < > = values in this interval not displayed.   BMET Recent Labs    08/13/19 1614 08/13/19 1614 08/14/19 0610 08/14/19 1105 08/15/19 0734  NA 137   < > 140 140 138   K 4.5   < > 5.2* 4.1 3.7  CL 107   < > 112* 110 109  CO2 20*  --  16*  --  19*  GLUCOSE 131*   < > 124* 130* 128*  BUN 78*   < > 81* 75* 59*  CREATININE 1.36*   < > 1.41* 1.30* 1.28*  CALCIUM 9.4  --  9.1  --  8.9   < > = values in this interval not displayed.   LFT Recent Labs    08/13/19 1614  PROT 5.1*  ALBUMIN 2.5*  AST 15  ALT 16  ALKPHOS 67  BILITOT 0.4   PT/INR Recent Labs    08/13/19 2055 08/14/19 0950  LABPROT 27.9* 17.8*  INR 2.6* 1.5*    Studies/Results: CT ABDOMEN PELVIS W CONTRAST  Result Date: 08/14/2019 CLINICAL DATA:  Cirrhosis EXAM: CT ABDOMEN AND PELVIS WITH CONTRAST TECHNIQUE: Multidetector CT imaging of the abdomen and pelvis was performed using the standard protocol following bolus administration of intravenous contrast. CONTRAST:  168mL OMNIPAQUE IOHEXOL 300 MG/ML  SOLN COMPARISON:  August 24, 2018 FINDINGS: Lower chest: There is atelectasis versus scarring at the lung bases bilaterally.The heart size is normal. Hepatobiliary: Again noted are findings of cirrhosis. There is an ill-defined  masslike area involving hepatic segment 4 B (axial series 3, image 23). This appears relatively stable when compared to prior study. There may be a small calcifications centered within this mass. Cholelithiasis without acute inflammation.There is no biliary ductal dilation. Pancreas: Normal contours without ductal dilatation. No peripancreatic fluid collection. Spleen: The spleen is enlarged measuring approximately 13 cm craniocaudad. Adrenals/Urinary Tract: --Adrenal glands: No adrenal hemorrhage. --Right kidney/ureter: No hydronephrosis or perinephric hematoma. --Left kidney/ureter: No hydronephrosis or perinephric hematoma. --Urinary bladder: There is severe diffuse enlargement of the urinary bladder with bladder wall trabeculation. Stomach/Bowel: --Stomach/Duodenum: No hiatal hernia or other gastric abnormality. Normal duodenal course and caliber. --Small bowel: No  dilatation or inflammation. --Colon: Rectosigmoid diverticulosis without acute inflammation. --Appendix: Not visualized. No right lower quadrant inflammation or free fluid. Vascular/Lymphatic: Atherosclerotic calcification is present within the non-aneurysmal abdominal aorta, without hemodynamically significant stenosis. 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 There is a retroaortic left renal vein, a normal variant. --No retroperitoneal lymphadenopathy. --No mesenteric lymphadenopathy. --No pelvic or inguinal lymphadenopathy. 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 Other: There is a small volume of free fluid in the patient's abdomen. Multiple midline fat containing abdominal wall hernias are noted. Musculoskeletal. No acute displaced fractures. IMPRESSION: 1. Again noted are findings of cirrhosis with stigmata of portal hypertension as seen on the patient's prior CT. 2. There is an ill-defined 5.9 cm hepatic mass as detailed above. Given its stability from prior study, this is favored to represent a benign process such as a hepatic hemangioma. However, this is incompletely characterized on this exam. Follow-up with a nonemergent outpatient liver mass protocol MRI is recommended for further evaluation. 3. Small volume ascites.  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 4. Again noted is a severely distended urinary bladder with bladder wall trabeculation. Findings are favored to be secondary to a chronic outlet obstruction. 5. There is cholelithiasis without secondary signs of acute cholecystitis. 6.  Aortic Atherosclerosis (ICD10-I70.0). Electronically Signed   By: Constance Holster M.D.   On: 08/14/2019 21:09   DG Chest Portable 1 View  Result Date: 08/13/2019 CLINICAL DATA:  Hypertension. Shortness of breath. EXAM: PORTABLE CHEST 1 VIEW COMPARISON:  None. FINDINGS: Postsurgical changes from CABG. Calcific atherosclerotic disease of the aorta. Cardiomediastinal silhouette is normal. Mediastinal contours  appear intact. There is no evidence of focal airspace consolidation, pleural effusion or pneumothorax. Osseous structures are without acute abnormality. Soft tissues are grossly normal. IMPRESSION: 1. No evidence of acute cardiopulmonary abnormality. 2. Calcific atherosclerotic disease of the aorta. Electronically Signed   By: Fidela Salisbury M.D.   On: 08/13/2019 16:46   Scheduled Meds: . sodium chloride   Intravenous Once  . finasteride  5 mg Oral Daily  . lidocaine  1 patch Transdermal Q24H  . pantoprazole  40 mg Oral BID  . tamsulosin  0.4 mg Oral QHS   Continuous Infusions: PRN Meds:.acetaminophen **OR** acetaminophen, diclofenac Sodium   ASSESMENT:   *   Symptomatic anemia.  Dark, FOBT positive stool.  On oral iron PTA.   Hgb 6.7 >> 2 PRBC >> 8.4 >> 7.4.  One additional unit ordered.   08/14/19 EGD.  GE flap valve suggestive of HH.  Hematin, clotted blood throughout the stomach irrigated, lavaged, suctioned.  Bleeding AVM in the greater curvature of gastrum treated with APC fulguration.  Normal duodenum.  No varices.  No portal hypertension.  *   Resection of colon cancer 1999.  *     Cirrhosis and stable hepatic mass, new splenomegaly on current CT  scan. Cirrhosis was visualized on 07/2018 CT as well but never mentioned in provider notes.   Hx alcohol abuse.  Patient says he has not had a drink in 9 months. Morbid obesity.  These raise ddx of fatty liver, alcoholic and non-alcoholic hepatitis evolving to cirrhosis.   With the exception of albumin of 2.5, LFTs are normal.  *   CAD.  Chronic Xarelto on hold..  *    Chronic NSAIDs for back pain.  *     Coagulopathy, INR 2.6 >> 1.5 for unclear reason, question is this sequela of cirrhosis..  *   Acute renal failure.  Rise BUN > rise creatinine.  81/1.4 >> 59/1.2  *   Aortic vascular dz with calcifications on CXR and CT.     PLAN   *   Check Hep B and C serologies, CBC  In AM.   *    3rd PRBC to transfuse today.    *     Switch to oral PPI, twice daily for 7 days.  Daily PPI thereafter.  *    ?  And to resume Xarelto?    Azucena Freed  08/15/2019, 10:56 AM Phone [place] Attending   I have taken an interval history, reviewed the chart and examined the patient. I agree with the Advanced Practitioner's note, impression and recommendations.    Here are my thoughts  1) GI bleeding seems better/treated    Off Xarelto x 1 week (he is awaiting outpt cardiology eval per PCP note)   F/U Dr. Hilarie Fredrickson or APP in our office 1-2 weeks   Daily PPI   No NSAID w/ Xarelto  2) Cirrhosis by imaging but not labs, etc - I think the INR increase might have been from Xarelto   Clinical f/u, MRI - see #3  3) Stable liver mass suggests benign   outpt MR  4) hx colon cancer - needs a surveillance colonoscopy   Outpt f/u Dr. Hilarie Fredrickson  We will check tomorrow - suspect from GI standpoint will be able to go home tomorrow but other problems might keep him   Gatha Mayer, MD, [facility] 08/15/2019 2:50 PM [phone]

## 2019-08-15 NOTE — Discharge Summary (Addendum)
[facility] Discharge Summary  Patient name: Brandon [name] Medical record number: [id number] Date of birth: 10/24/1945 Age: 74 y.o. Gender: male Date of Admission: 08/13/2019  Date of Discharge: 08/16/2019 Admitting Physician: Zenia Resides, MD  Primary Care Provider: Helane Rima, MD Consultants: GI  Indication for Hospitalization: Dizziness  Discharge Diagnoses/Problem List:  Active Problems:   Symptomatic anemia   Acute GI bleeding   Angiodysplasia of stomach with hemorrhage   Gastrointestinal hemorrhage   Atrial fibrillation (HCC)   Chronic pain syndrome  Disposition: Home  Discharge Condition: Stable  Discharge Exam:  General: Alert and oriented in no apparent distress Heart: S1, S2 with no murmurs appreciated Lungs: CTA bilaterally Abdomen: Bowel sounds present, no abdominal pain Skin: Warm and dry Extremities: Mild lower limb edema without pitting.  Brief Hospital Course:  Patient was admitted with dizziness, found to have hemoglobin of 8.9.  Patient had recently been started on the Xarelto for new onset A. fib and normally has a baseline hemoglobin about 13.8.  Patient also admits to taking approximately "20" ibuprofen a day due to severe knee pain.  FOBT was positive and GI was consulted.  Patient received 2 units of packed RBCs.  GI did an EGD which showed a bleeding angiectasia of the stomach which was treated.  Due to the patient's hemoglobin still been below the infusion threshold of 8.0 patient received an additional unit of packed RBCs on 1/18.  Patient's hemoglobin was continued to be monitored and at the time of discharge remained stable at 8.8.  As the patient had evidence of a (now treated) upper GI bleed which was symptomatic and a CHA2DS2-VASc score of 4 the decision was made to hold the patient's anticoagulation for 1 week with an outpatient trial restarting Xarelto at that time.  A. fib Patient recently started on Xarelto for  his atrial fibrillation, presenting with symptomatic upper GI bleed.  Xarelto was held and the decision was made to continue to hold this upon discharge with a trial of Xarelto given in 1 week after discharge with close monitoring of the patient's hemoglobin.  Patient has CHA2DS2-VASc score of 4.  Chronic knee pain Patient admitted with use of 20 ibuprofen per day for his chronic knee pain.  This likely contributed to his upper GI bleed.  On discharge patient's pain regimen was changed to acetaminophen, and OTC treatments including Voltaren gel, lidocaine patches, heating pads.  Recommend patient follow-up with his PCP for further discussion of chronic knee pain treatments.  GI recommended avoiding NSAIDs while on Xarelto.   Issues for Follow Up:  1. PCP-patient's Xarelto held on discharge, per GI recommend restarting in about 1 week with close monitoring of patient's hemoglobin if appropriate.  CHA2DS2-VASc currently of 4. 2. Recommend screening colonoscopy for patient as appropriate. 3. Recommend follow-up on liver mass. He has follow up with GI in 09/2019, stated they will obtain a liver MRI for further evaluation (in addition to further assessing for liver cirrhosis given liver appearance on CT abdomen with known alcoholic history, synthetic function labs WNL)  4. PCP consider physical therapy for patient's chronic knee pain 5. Started on PPI BID for 1 week by GI, then recommended decrease to once daily. Please ensure he has done this.   Significant Procedures: EGD-1/17  Significant Labs and Imaging:  Recent Labs  Lab 08/14/19 1046 08/14/19 1105 08/15/19 0734 08/15/19 1626 08/16/19 0616  WBC 9.8  --  8.6  --  9.5  HGB 8.3*   < >  7.4* 8.4* 8.8*  HCT 25.9*   < > 22.8* 25.0* 26.8*  PLT 199  --  178  --  183   < > = values in this interval not displayed.   Recent Labs  Lab 08/13/19 1614 08/13/19 1614 08/14/19 0610 08/14/19 0610 08/14/19 1105 08/14/19 1105 08/15/19 0734  08/16/19 0616  NA 137  --  140  --  140  --  138 140  K 4.5   < > 5.2*   < > 4.1   < > 3.7 4.0  CL 107  --  112*  --  110  --  109 109  CO2 20*  --  16*  --   --   --  19* 23  GLUCOSE 131*  --  124*  --  130*  --  128* 128*  BUN 78*  --  81*  --  75*  --  59* 39*  CREATININE 1.36*  --  1.41*  --  1.30*  --  1.28* 1.14  CALCIUM 9.4  --  9.1  --   --   --  8.9 9.0  MG 1.8  --   --   --   --   --   --   --   ALKPHOS 67  --   --   --   --   --   --   --   AST 15  --   --   --   --   --   --   --   ALT 16  --   --   --   --   --   --   --   ALBUMIN 2.5*  --   --   --   --   --   --   --    < > = values in this interval not displayed.    CT ABDOMEN PELVIS W CONTRAST  Result Date: 08/14/2019 CLINICAL DATA:  Cirrhosis EXAM: CT ABDOMEN AND PELVIS WITH CONTRAST TECHNIQUE: Multidetector CT imaging of the abdomen and pelvis was performed using the standard protocol following bolus administration of intravenous contrast. CONTRAST:  157mL OMNIPAQUE IOHEXOL 300 MG/ML  SOLN COMPARISON:  August 24, 2018 FINDINGS: Lower chest: There is atelectasis versus scarring at the lung bases bilaterally.The heart size is normal. Hepatobiliary: Again noted are findings of cirrhosis. There is an ill-defined masslike area involving hepatic segment 4 B (axial series 3, image 23). This appears relatively stable when compared to prior study. There may be a small calcifications centered within this mass. Cholelithiasis without acute inflammation.There is no biliary ductal dilation. Pancreas: Normal contours without ductal dilatation. No peripancreatic fluid collection. Spleen: The spleen is enlarged measuring approximately 13 cm craniocaudad. Adrenals/Urinary Tract: --Adrenal glands: No adrenal hemorrhage. --Right kidney/ureter: No hydronephrosis or perinephric hematoma. --Left kidney/ureter: No hydronephrosis or perinephric hematoma. --Urinary bladder: There is severe diffuse enlargement of the urinary bladder with bladder wall  trabeculation. Stomach/Bowel: --Stomach/Duodenum: No hiatal hernia or other gastric abnormality. Normal duodenal course and caliber. --Small bowel: No dilatation or inflammation. --Colon: Rectosigmoid diverticulosis without acute inflammation. --Appendix: Not visualized. No right lower quadrant inflammation or free fluid. Vascular/Lymphatic: Atherosclerotic calcification is present within the non-aneurysmal abdominal aorta, without hemodynamically significant stenosis. 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 There is a retroaortic left renal vein, a normal variant. --No retroperitoneal lymphadenopathy. --No mesenteric lymphadenopathy. --No pelvic or inguinal lymphadenopathy. 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 Other: There is a small volume of free  fluid in the patient's abdomen. Multiple midline fat containing abdominal wall hernias are noted. Musculoskeletal. No acute displaced fractures. IMPRESSION: 1. Again noted are findings of cirrhosis with stigmata of portal hypertension as seen on the patient's prior CT. 2. There is an ill-defined 5.9 cm hepatic mass as detailed above. Given its stability from prior study, this is favored to represent a benign process such as a hepatic hemangioma. However, this is incompletely characterized on this exam. Follow-up with a nonemergent outpatient liver mass protocol MRI is recommended for further evaluation. 3. Small volume ascites.  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 4. Again noted is a severely distended urinary bladder with bladder wall trabeculation. Findings are favored to be secondary to a chronic outlet obstruction. 5. There is cholelithiasis without secondary signs of acute cholecystitis. 6.  Aortic Atherosclerosis (ICD10-I70.0). Electronically Signed   By: Constance Holster M.D.   On: 08/14/2019 21:09     Results/Tests Pending at Time of Discharge: None  Discharge Medications:  Allergies as of 08/16/2019   No Known Allergies     Medication  List    STOP taking these medications   ibuprofen 200 MG tablet Commonly known as: ADVIL   omeprazole 10 MG capsule Commonly known as: PRILOSEC   Xarelto Starter Pack 15 & 20 MG Tbpk Generic drug: Rivaroxaban     TAKE these medications   allopurinol 300 MG tablet Commonly known as: ZYLOPRIM Take 300 mg by mouth at bedtime.   amLODipine 5 MG tablet Commonly known as: NORVASC Take 5 mg by mouth daily.   Artificial Tears 1.4 % ophthalmic solution Generic drug: polyvinyl alcohol Place 1 drop into the left eye 3 (three) times daily.   ascorbic acid 1000 MG tablet Commonly known as: VITAMIN C Take 1 tablet (1,000 mg total) by mouth daily.   aspirin EC 81 MG tablet Take 81 mg by mouth daily.   cholecalciferol 25 MCG (1000 UNIT) tablet Commonly known as: VITAMIN D3 Take 2 tablets (2,000 Units total) by mouth 2 (two) times daily. What changed: when to take this   diclofenac Sodium 1 % Gel Commonly known as: VOLTAREN Apply 2 g topically 4 (four) times daily as needed (pain).   Durezol 0.05 % Emul Generic drug: Difluprednate Place 1 drop into the right eye 4 (four) times daily.   ferrous sulfate 325 (65 FE) MG tablet Take 325 mg by mouth daily.   finasteride 5 MG tablet Commonly known as: PROSCAR Take 5 mg by mouth daily.   folic acid 1 MG tablet Commonly known as: FOLVITE Take 1 tablet (1 mg total) by mouth daily.   gatifloxacin 0.5 % Soln Commonly known as: ZYMAXID Place 1 drop into the right eye 4 (four) times daily.   influenza vaccine adjuvanted 0.5 ML injection Commonly known as: FLUAD Inject 0.5 mLs into the muscle tomorrow at 10 am for 1 dose.   ketorolac 0.5 % ophthalmic solution Commonly known as: ACULAR Place 1 drop into the right eye 4 (four) times daily.   lisinopril 40 MG tablet Commonly known as: ZESTRIL Take 40 mg by mouth daily.   magnesium oxide 400 (241.3 Mg) MG tablet Commonly known as: MAG-OX Take 1 tablet (400 mg total) by mouth  daily.   MAGNESIUM PO Take 1 tablet by mouth daily.   metFORMIN 500 MG 24 hr tablet Commonly known as: GLUCOPHAGE-XR Take 500 mg by mouth daily with breakfast.   multivitamin with minerals Tabs tablet Take 1 tablet by mouth daily.   NONFORMULARY OR  COMPOUNDED ITEM Apply 1 application topically 2 (two) times daily as needed (rash). Triamcinolone 0.1% and Aquaphor compound steroid cream, 3:1 ratio, 1 pound jar   pantoprazole 40 MG tablet Commonly known as: PROTONIX Take 1 pill twice a day for 1 week. Then decrease to 1 pill daily.   pneumococcal 23 valent vaccine 25 MCG/0.5ML injection Commonly known as: PNEUMOVAX-23 Inject 0.5 mLs into the muscle tomorrow at 10 am for 1 dose.   polyethylene glycol 17 g packet Commonly known as: MIRALAX / GLYCOLAX Take 17 g by mouth 2 (two) times daily. What changed:   when to take this  additional instructions   pravastatin 10 MG tablet Commonly known as: PRAVACHOL Take 10 mg by mouth at bedtime.   senna-docusate 8.6-50 MG tablet Commonly known as: Senokot-S Take 1 tablet by mouth 2 (two) times daily. What changed: when to take this   tamsulosin 0.4 MG Caps capsule Commonly known as: FLOMAX Take 0.4 mg by mouth at bedtime.   thiamine 100 MG tablet Take 1 tablet (100 mg total) by mouth daily.       Discharge Instructions: Please refer to Patient Instructions section of EMR for full details.  Patient was counseled important signs and symptoms that should prompt return to medical care, changes in medications, dietary instructions, activity restrictions, and follow up appointments.   Follow-Up Appointments: Follow-up Information    Pyrtle, Lajuan Lines, MD Follow up on 09/27/2019.   Specialty: Gastroenterology Why: 10:10  appt with GI Dr.   Minette Brine information: [address] [address] [phone]        Helane Rima, MD. Schedule an appointment as soon as possible for a visit in 1 week(s).   Specialty: Family  Medicine Contact information: [place] [address] [phone]           Lurline Del, DO 08/16/2019, 12:15 PM PGY-1, [place] Upper-Level Resident Addendum    I have discussed the above with the original author and agree with their documentation. Please see also any attending notes.    Patriciaann Clan, DO  Family Medicine PGY-2

## 2019-08-15 NOTE — Progress Notes (Signed)
Family Medicine Teaching Service Daily Progress Note Intern Pager: [phone]  Patient name: Brandon [name] Medical record number: [id number] Date of birth: [birth year] Age: 74 y.o. Gender: male  Primary Care Provider: Helane Rima, MD Consultants: GI Code Status: DNR  Pt Overview and Major Events to Date:  1/16 - admitted for acute GI bleed  Assessment and Plan: Brandon [name] is a 74 y.o. male presenting with 1 day of dizziness. PMH is significant for colon cancer, type 2 diabetes, chronic lower extremity pain, atrial fibrillation, obesity, hypertension, hyperlipidemia, hypertension, CAD CABG x2, BPH, history of liver cirrhosis, history of alcohol abuse  Acute GI bleed 2/2 chronic NSAID use and recently started Xarelto Patient with baseline hemoglobin of about 13.8, status post 2 units packed RBCs due to hemoglobin of 6.7 on 1/16-1/17.  Hemoglobin this morning of 7.4.  Continues on IV protonix. GI following.  EGD completed 1/17 did show single bleeding angiectasia in the stomach which was treated.  Most recent hemoglobin 8.2 on 1/17. CT abdomen pelvis completed 1/17 showed findings of cirrhosis with stigmata of portal hypertension as seen in prior CT.  Also showed ill-defined 5.9 cm hepatic mass, stable from previous CT.  Recommend outpatient liver mass protocol MRI for further evaluation.  Also showed small volume ascites and esophageal varices and distended urinary bladder with bladder wall trabeculation consistent with chronic outlet obstruction. -IV Protonix every 12 hours -Due to transfusion threshold of 8.0 we will provide another unit -GI consult, appreciate recommendations -SCDs -Hold anticoagulation until hemoglobin stabilizes  AKI Creatinine 1.3 on admission with baseline of 0.9-1.  Currently 1.28. UA is positive for hemoglobin large leuks, bacteria and protein though is asymptomatic.  Urine culture obtained in ED.  Expect to improve after hydration and transfusion, may consider  further urine studies if not. -Continue IV fluids, transfusion -Hold lisinopril and avoid nephrotoxic agents.  Atrial fibrillation EKG shows A. fib that is rate controlled on admission.  CHA2DS2-VASc 2 4.  HASBLED 5.  Patient previously anticoagulated on Xarelto.  Questionable takes propranolol. -Cardiac telemetry -Hold anticoagulation in setting of acute bleed -Monitor heart rate, low threshold to restart propranolol for rate control -We will need to discuss anticoagulation going forward as patient recently had upper GI bleed.  Diabetes mellitus type 2 A1c 6.4 on 05/2019.  Home medication includes metformin XR 500 mg.   -Hold Metformin -Monitor glucose with BMP in a.m.  Hypertension Initially hypotensive with systolics in the 0000000.  Improved status post bolus of 500 mL and initiating transfusion to systolics in the [id number].    Current systolics [id number].  Takes amlodipine 10 mg and lisinopril 40 mg at home. -Hold home medications -Monitor blood pressures  Hyperlipidemia Home medication pravastatin.  -Hold statin while n.p.o.  GERD Patient takes omeprazole 10 at home. -Continue IV Protonix -GI recommendations appreciated  BPH Takes finasteride and tamsulosin at home.  CAD with CABG x2 Patient takes ASA 81 daily.  On pravastatin. -Hold home medications in setting of GI bleed  Morbid obesity -Recommend weight management in the outpatient setting.  Chronic knee pain Patient with history of patellar fracture of the right knee status post orthopedic repair in 09/2018.  Reports continued pain.  Patient takes roughly "20" ibuprofen a day for pain. 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  Patient reports being sober for 1 year.  LFTs normal admission. -PT/INR  and APTT pending, unsure clinical usefulness in  setting of recently starting Xarelto -CIWA scores: 1 overnight  History of colon cancer 2002 Patient with history of colon cancer several years ago.  Does not have a local GI doctor. -Appreciate GI recommendations -Recommend outpatient colonoscopy.  FEN/GI: N.p.o., IV Protonix, maintenance IV fluids Prophylaxis: SCDs  Disposition: continue inpt management  Subjective:  Patient without complaints this morning.  States he has not noticed any blood in stool he has chronically dark stools due to iron supplementation.  Objective: Temp:  [97.2 F (36.2 C)-97.9 F (36.6 C)] 97.8 F (36.6 C) (01/18 0526) Pulse Rate:  [90-105] 90 (01/18 0526) Resp:  [10-23] 18 (01/18 0526) BP: (111-141)/(44-71) 121/60 (01/18 0526) SpO2:  [98 %-100 %] 98 % (01/18 0526) Weight:  [147 kg] 147 kg (01/17 1053) Physical Exam: General: Alert and oriented in no apparent distress Heart: irregular rhythm with no murmurs appreciated Lungs: CTA bilaterally, no wheezing Abdomen: Bowel sounds present, no abdominal pain Skin: Warm and dry Extremities: Mild lower limb edema without pitting.   Laboratory: Recent Labs  Lab 08/13/19 2055 08/13/19 2055 08/14/19 1046 08/14/19 1105 08/15/19 0734  WBC 11.2*  --  9.8  --  8.6  HGB 6.7*   < > 8.3* 8.2* 7.4*  HCT 21.3*   < > 25.9* 24.0* 22.8*  PLT 219  --  199  --  178   < > = values in this interval not displayed.   Recent Labs  Lab 08/13/19 1614 08/13/19 1614 08/14/19 0610 08/14/19 1105 08/15/19 0734  NA 137   < > 140 140 138  K 4.5   < > 5.2* 4.1 3.7  CL 107   < > 112* 110 109  CO2 20*  --  16*  --  19*  BUN 78*   < > 81* 75* 59*  CREATININE 1.36*   < > 1.41* 1.30* 1.28*  CALCIUM 9.4  --  9.1  --  8.9  PROT 5.1*  --   --   --   --   BILITOT 0.4  --   --   --   --   ALKPHOS 67  --   --   --   --   ALT 16  --   --   --   --   AST 15  --   --   --   --   GLUCOSE 131*   < > 124* 130* 128*   < > = values in this interval not displayed.     Imaging/Diagnostic Tests: CT ABDOMEN PELVIS W CONTRAST  Result Date: 08/14/2019 CLINICAL DATA:  Cirrhosis EXAM: CT ABDOMEN AND PELVIS WITH CONTRAST TECHNIQUE: Multidetector CT imaging of the abdomen and pelvis was performed using the standard protocol following bolus administration of intravenous contrast. CONTRAST:  180mL OMNIPAQUE IOHEXOL 300 MG/ML  SOLN COMPARISON:  August 24, 2018 FINDINGS: Lower chest: There is atelectasis versus scarring at the lung bases bilaterally.The heart size is normal. Hepatobiliary: Again noted are findings of cirrhosis. There is an ill-defined masslike area involving hepatic segment 4 B (axial series 3, image 23). This appears relatively stable when compared to prior study. There may be a small calcifications centered within this mass. Cholelithiasis without acute inflammation.There is no biliary ductal dilation. Pancreas: Normal contours without ductal dilatation. No peripancreatic fluid collection. Spleen: The spleen is enlarged measuring approximately 13 cm craniocaudad. Adrenals/Urinary Tract: --Adrenal glands: No adrenal hemorrhage. --Right kidney/ureter: No hydronephrosis or perinephric hematoma. --Left kidney/ureter: No  hydronephrosis or perinephric hematoma. --Urinary bladder: There is severe diffuse enlargement of the urinary bladder with bladder wall trabeculation. Stomach/Bowel: --Stomach/Duodenum: No hiatal hernia or other gastric abnormality. Normal duodenal course and caliber. --Small bowel: No dilatation or inflammation. --Colon: Rectosigmoid diverticulosis without acute inflammation. --Appendix: Not visualized. No right lower quadrant inflammation or free fluid. Vascular/Lymphatic: Atherosclerotic calcification is present within the non-aneurysmal abdominal aorta, without hemodynamically significant stenosis. 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 There is a retroaortic left renal vein, a normal variant. --No retroperitoneal lymphadenopathy. --No mesenteric  lymphadenopathy. --No pelvic or inguinal lymphadenopathy. 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 Other: There is a small volume of free fluid in the patient's abdomen. Multiple midline fat containing abdominal wall hernias are noted. Musculoskeletal. No acute displaced fractures. IMPRESSION: 1. Again noted are findings of cirrhosis with stigmata of portal hypertension as seen on the patient's prior CT. 2. There is an ill-defined 5.9 cm hepatic mass as detailed above. Given its stability from prior study, this is favored to represent a benign process such as a hepatic hemangioma. However, this is incompletely characterized on this exam. Follow-up with a nonemergent outpatient liver mass protocol MRI is recommended for further evaluation. 3. Small volume ascites.  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 4. Again noted is a severely distended urinary bladder with bladder wall trabeculation. Findings are favored to be secondary to a chronic outlet obstruction. 5. There is cholelithiasis without secondary signs of acute cholecystitis. 6.  Aortic Atherosclerosis (ICD10-I70.0). Electronically Signed   By: Constance Holster M.D.   On: 08/14/2019 21:09    Lurline Del, DO 08/15/2019, 9:16 AM PGY-3, [place] Intern pager: [phone], text pages welcome

## 2019-08-15 NOTE — Progress Notes (Deleted)
Received call from patient's OB/GYN, Dr. Charlesetta Garibaldi.  She spoke with patient.  They have already scheduled outpatient telehealth follow-up.  They are comfortable with discharge today.

## 2019-08-15 NOTE — Plan of Care (Signed)

## 2019-08-15 NOTE — Telephone Encounter (Signed)
Patient is scheduled for appointment with Dr Hilarie Fredrickson on 09/27/19 at 10:10 am. Will speak with patient after discharge to advise.

## 2019-08-16 ENCOUNTER — Other Ambulatory Visit: Payer: Self-pay | Admitting: Family Medicine

## 2019-08-16 LAB — BASIC METABOLIC PANEL
Anion gap: 8 (ref 5–15)
BUN: 39 mg/dL — ABNORMAL HIGH (ref 8–23)
CO2: 23 mmol/L (ref 22–32)
Calcium: 9 mg/dL (ref 8.9–10.3)
Chloride: 109 mmol/L (ref 98–111)
Creatinine, Ser: 1.14 mg/dL (ref 0.61–1.24)
GFR calc Af Amer: >60 mL/min (ref >60)
GFR calc non Af Amer: >60 mL/min (ref >60)
Glucose, Bld: 128 mg/dL — ABNORMAL HIGH (ref 70–99)
Potassium: 4 mmol/L (ref 3.5–5.1)
Sodium: 140 mmol/L (ref 135–145)

## 2019-08-16 LAB — CBC
HCT: 26.8 % — ABNORMAL LOW (ref 39.0–52.0)
Hemoglobin: 8.8 g/dL — ABNORMAL LOW (ref 13.0–17.0)
MCH: 31.3 pg (ref 26.0–34.0)
MCHC: 32.8 g/dL (ref 30.0–36.0)
MCV: 95.4 fL (ref 80.0–100.0)
Platelets: 183 10*3/uL (ref 150–400)
RBC: 2.81 MIL/uL — ABNORMAL LOW (ref 4.22–5.81)
RDW: 17.3 % — ABNORMAL HIGH (ref 11.5–15.5)
WBC: 9.5 10*3/uL (ref 4.0–10.5)
nRBC: 1.6 % — ABNORMAL HIGH (ref 0.0–0.2)

## 2019-08-16 LAB — HEPATITIS C ANTIBODY: HCV Ab: NONREACTIVE

## 2019-08-16 LAB — HEPATITIS B SURFACE ANTIBODY,QUALITATIVE: Hep B S Ab: NONREACTIVE

## 2019-08-16 LAB — HEPATITIS B SURFACE ANTIGEN: Hepatitis B Surface Ag: NONREACTIVE

## 2019-08-16 MED ORDER — PANTOPRAZOLE SODIUM 40 MG PO TBEC: DELAYED_RELEASE_TABLET | ORAL | 0 refills | Status: DC

## 2019-08-16 MED ORDER — PNEUMOCOCCAL VAC POLYVALENT 25 MCG/0.5ML IJ INJ: 0.5000 mL | INJECTION | INTRAMUSCULAR | 0 refills | Status: AC

## 2019-08-16 MED ORDER — INFLUENZA VAC A&B SA ADJ QUAD 0.5 ML IM PRSY: 0.5000 mL | PREFILLED_SYRINGE | INTRAMUSCULAR | 0 refills | Status: AC

## 2019-08-16 MED ORDER — DICLOFENAC SODIUM 1 % EX GEL: 2.0000 g | Freq: Four times a day (QID) | CUTANEOUS | 0 refills | Status: DC | PRN

## 2019-08-16 NOTE — Discharge Instructions (Signed)
You for admitted to the hospital for a gastrointestinal bleed.  This is likely due to combination of you taking your ibuprofen for pain and taking Xarelto.  You are being discharged today due resolution of your bleeding.  You are being instructed per GI to stop your Xarelto for 1 week.  Please restart this under the supervision of your PCP and/or gastroenterologist.  Avoid taking any NSAIDs such as ibuprofen or naproxen for pain.  Please come back to the hospital if you develop any of the following symptoms including dizziness, gastrointestinal pain, nausea or vomiting, dark tarry stools, bright red blood in your stool or any other worrisome symptom.  Gastrointestinal Bleeding Gastrointestinal (GI) bleeding is bleeding somewhere along the path that food travels through the body (digestive tract). This path is anywhere between the mouth and the opening of the butt (anus). You may have blood in your poop (stool) or have black poop. If you throw up (vomit), there may be blood in it. This condition can be mild, serious, or even life-threatening. If you have a lot of bleeding, you may need to stay in the hospital. What are the causes? This condition may be caused by:  Irritation and swelling of the esophagus (esophagitis). The esophagus is part of the body that moves food from your mouth to your stomach.  Swollen veins in the butt (hemorrhoids).  Areas of painful tearing in the opening of the butt (anal fissures). These are often caused by passing hard poop.  Pouches that form on the colon over time (diverticulosis).  Irritation and swelling (diverticulitis) in areas where pouches have formed on the colon.  Growths (polyps) or cancer. Colon cancer often starts out as growths that are not cancer.  Irritation of the stomach lining (gastritis).  Sores (ulcers) in the stomach. What increases the risk? You are more likely to develop this condition if you:  Have a certain type of infection in your  stomach (Helicobacter pylori infection).  Take certain medicines.  Smoke.  Drink alcohol. What are the signs or symptoms? Common symptoms of this condition include:  Throwing up (vomiting) material that has bright red blood in it. It may look like coffee grounds.  Changes in your poop. The poop may: ? Have red blood in it. ? Be black, look like tar, and smell stronger than normal. ? Be red.  Pain or cramping in the belly (abdomen). How is this treated? Treatment for this condition depends on the cause of the bleeding. For example:  Sometimes, the bleeding can be stopped during a procedure that is done to find the problem (endoscopy or colonoscopy).  Medicines can be used to: ? Help control irritation, swelling, or infection. ? Reduce acid in your stomach.  Certain problems can be treated with: ? Creams. ? Medicines that are put in the butt (suppositories). ? Warm baths.  Surgery is sometimes needed.  If you lose a lot of blood, you may need a blood transfusion. If bleeding is mild, you may be allowed to go home. If there is a lot of bleeding, you will need to stay in the hospital. Follow these instructions at home:   Take over-the-counter and prescription medicines only as told by your doctor.  Eat foods that have a lot of fiber in them. These foods include beans, whole grains, and fresh fruits and vegetables. You can also try eating 1-3 prunes each day.  Drink enough fluid to keep your pee (urine) pale yellow.  Keep all follow-up visits as told  by your doctor. This is important. Contact a doctor if:  Your symptoms do not get better. Get help right away if:  Your bleeding does not stop.  You feel dizzy or you pass out (faint).  You feel weak.  You have very bad cramps in your back or belly.  You pass large clumps of blood (clots) in your poop.  Your symptoms are getting worse.  You have chest pain or fast heartbeats. Summary  GI bleeding is bleeding  somewhere along the path that food travels through the body (digestive tract).  This bleeding can be caused by many things. Treatment depends on the cause of the bleeding.  Take medicines only as told by your doctor.  Keep all follow-up visits as told by your doctor. This is important. This information is not intended to replace advice given to you by your health care provider. Make sure you discuss any questions you have with your health care provider. Document Revised: 02/24/2018 Document Reviewed: 02/24/2018 Elsevier Patient Education  [place].

## 2019-08-16 NOTE — Progress Notes (Signed)
Daily Rounding Note  08/16/2019, 8:24 AM  LOS: 3 days   SUBJECTIVE:   Chief complaint:  Bleeding gastric AVM.  Anemia.      BM yesterday, did not see it but not reported as bloody or black.  No dyspnea, dizziness.  Feels well.  Enjoying solid food.   OBJECTIVE:         Vital signs in last 24 hours:    Temp:  [97.7 F (36.5 C)-98.5 F (36.9 C)] 97.7 F (36.5 C) (01/19 [id number]) Pulse Rate:  [76-99] 78 (01/19 0613) Resp:  [16-19] 18 (01/19 0613) BP: (98-142)/(53-82) 132/53 (01/19 0613) SpO2:  [98 %-99 %] 99 % (01/19 0613) Last BM Date: 08/15/19 Filed Weights   08/13/19 1538 08/14/19 1053  Weight: (!) 147 kg (!) 147 kg   General: obese, not ill looking   Heart: RRR Chest: clear bil.  No dyspnea Abdomen: soft, NT, ND.  Active BS  Extremities: LE edema Neuro/Psych:  Oriented x 3.  Fluid speech.  Cooperative.  No gross deficits.    Intake/Output from previous day: 01/18 0701 - 01/19 0700 In: 1156.5 [P.O.:720; Blood:436.5] Out: 1450 [Urine:1450]  Intake/Output this shift: No intake/output data recorded.  Lab Results: Recent Labs    08/14/19 1046 08/14/19 1105 08/15/19 0734 08/15/19 1626 08/16/19 0616  WBC 9.8  --  8.6  --  9.5  HGB 8.3*   < > 7.4* 8.4* 8.8*  HCT 25.9*   < > 22.8* 25.0* 26.8*  PLT 199  --  178  --  183   < > = values in this interval not displayed.   BMET Recent Labs    08/14/19 0610 08/14/19 0610 08/14/19 1105 08/15/19 0734 08/16/19 0616  NA 140   < > 140 138 140  K 5.2*   < > 4.1 3.7 4.0  CL 112*   < > 110 109 109  CO2 16*  --   --  19* 23  GLUCOSE 124*   < > 130* 128* 128*  BUN 81*   < > 75* 59* 39*  CREATININE 1.41*   < > 1.30* 1.28* 1.14  CALCIUM 9.1  --   --  8.9 9.0   < > = values in this interval not displayed.   LFT Recent Labs    08/13/19 1614  PROT 5.1*  ALBUMIN 2.5*  AST 15  ALT 16  ALKPHOS 67  BILITOT 0.4   PT/INR Recent Labs    08/13/19 2055  08/14/19 0950  LABPROT 27.9* 17.8*  INR 2.6* 1.5*   Hepatitis Panel No results for input(s): HEPBSAG, HCVAB, HEPAIGM, HEPBIGM in the last 72 hours.  Studies/Results: CT ABDOMEN PELVIS W CONTRAST  Result Date: 08/14/2019 CLINICAL DATA:  Cirrhosis EXAM: CT ABDOMEN AND PELVIS WITH CONTRAST TECHNIQUE: Multidetector CT imaging of the abdomen and pelvis was performed using the standard protocol following bolus administration of intravenous contrast. CONTRAST:  152mL OMNIPAQUE IOHEXOL 300 MG/ML  SOLN COMPARISON:  August 24, 2018 FINDINGS: Lower chest: There is atelectasis versus scarring at the lung bases bilaterally.The heart size is normal. Hepatobiliary: Again noted are findings of cirrhosis. There is an ill-defined masslike area involving hepatic segment 4 B (axial series 3, image 23). This appears relatively stable when compared to prior study. There may be a small calcifications centered within this mass. Cholelithiasis without acute inflammation.There is no biliary ductal dilation. Pancreas: Normal contours without ductal dilatation. No peripancreatic fluid collection. Spleen: The spleen is enlarged  measuring approximately 13 cm craniocaudad. Adrenals/Urinary Tract: --Adrenal glands: No adrenal hemorrhage. --Right kidney/ureter: No hydronephrosis or perinephric hematoma. --Left kidney/ureter: No hydronephrosis or perinephric hematoma. --Urinary bladder: There is severe diffuse enlargement of the urinary bladder with bladder wall trabeculation. Stomach/Bowel: --Stomach/Duodenum: No hiatal hernia or other gastric abnormality. Normal duodenal course and caliber. --Small bowel: No dilatation or inflammation. --Colon: Rectosigmoid diverticulosis without acute inflammation. --Appendix: Not visualized. No right lower quadrant inflammation or free fluid. Vascular/Lymphatic: Atherosclerotic calcification is present within the non-aneurysmal abdominal aorta, without hemodynamically significant stenosis. Esophageal  varices are noted. There is a retroaortic left renal vein, a normal variant. --No retroperitoneal lymphadenopathy. --No mesenteric lymphadenopathy. --No pelvic or inguinal lymphadenopathy. 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 Other: There is a small volume of free fluid in the patient's abdomen. Multiple midline fat containing abdominal wall hernias are noted. Musculoskeletal. No acute displaced fractures. IMPRESSION: 1. Again noted are findings of cirrhosis with stigmata of portal hypertension as seen on the patient's prior CT. 2. There is an ill-defined 5.9 cm hepatic mass as detailed above. Given its stability from prior study, this is favored to represent a benign process such as a hepatic hemangioma. However, this is incompletely characterized on this exam. Follow-up with a nonemergent outpatient liver mass protocol MRI is recommended for further evaluation. 3. Small volume ascites.  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 4. Again noted is a severely distended urinary bladder with bladder wall trabeculation. Findings are favored to be secondary to a chronic outlet obstruction. 5. There is cholelithiasis without secondary signs of acute cholecystitis. 6.  Aortic Atherosclerosis (ICD10-I70.0). Electronically Signed   By: Constance Holster M.D.   On: 08/14/2019 21:09    ASSESMENT:   *   Symptomatic anemia.  Dark, FOBT positive stool.  On oral iron PTA.   Hgb 6.7 >> 2 PRBC >> 8.4 >> 7.4 >> 1 PRBC >> 8.8.   08/14/19 EGD.  GE flap valve suggestive of HH.  Hematin, clotted blood in stomach.  Bleeding AVM in greater curvature fulgurated with APC.  Normal duodenum.  No varices.  No portal hypertension.  *   Resection of colon cancer 1999.  *     Cirrhosis and stable hepatic mass, new splenomegaly on current CT scan. Cirrhosis was visualized on 07/2018 CT as well but never mentioned in provider notes.   Hx alcohol abuse.  Patient says he has not had a drink in 9 months. Morbid obesity.   These raise ddx of fatty liver, alcoholic and non-alcoholic hepatitis evolving to cirrhosis.   With the exception of albumin of 2.5, LFTs are normal.  *   CAD.  Chronic Xarelto on hold.  Plan resart   *    Coagulopathy, INR 2.6 >> 1.5   *   Acute renal failure.  improved    PLAN   *  Ok to [employer] home.  Has appt w Dr Hilarie Fredrickson on 3/2.      Azucena Freed  08/16/2019, 8:24 AM Phone [phone]

## 2019-08-16 NOTE — Progress Notes (Signed)
[name] to be D/C'd per MD order. Discussed with the patient and all questions fully answered. ? VSS, Skin clean, dry and intact without evidence of skin break down, no evidence of skin tears noted. ? IV catheter discontinued intact. Site without signs and symptoms of complications. Dressing and pressure applied. ? An After Visit Summary was printed and given to the patient. Patient informed where to pickup prescriptions. ? D/c education completed with patient/family including follow up instructions, medication list, d/c activities limitations if indicated, with other d/c instructions as indicated by MD - patient able to verbalize understanding, all questions fully answered.  ? Patient instructed to return to ED, call 911, or call MD for any changes in condition.  ? Patient to be escorted via [place], and D/C home via private auto.

## 2019-08-16 NOTE — Telephone Encounter (Signed)
===  View-only below this line=== ----- Message ----- From: Clearence Cheek Sent: 08/16/2019   8:42 AM EST To: Larina Bras, CMA  Yes.  I will notify pt.

## 2019-08-17 LAB — BPAM RBC
Blood Product Expiration Date: 202102062359
Blood Product Expiration Date: 202102072359
Blood Product Expiration Date: 202102082359
Blood Product Expiration Date: 202102102359
ISSUE DATE / TIME: 202101130629
ISSUE DATE / TIME: 202101162205
ISSUE DATE / TIME: 202101170429
ISSUE DATE / TIME: 202101181133
Unit Type and Rh: 7300
Unit Type and Rh: 7300
Unit Type and Rh: 7300
Unit Type and Rh: 7300

## 2019-08-17 LAB — TYPE AND SCREEN
ABO/RH(D): B POS
Antibody Screen: NEGATIVE
Unit division: 0
Unit division: 0
Unit division: 0
Unit division: 0

## 2019-09-04 ENCOUNTER — Other Ambulatory Visit: Payer: Self-pay | Admitting: Family Medicine

## 2019-09-16 ENCOUNTER — Inpatient Hospital Stay (HOSPITAL_COMMUNITY)
Admission: EM | Admit: 2019-09-16 | Discharge: 2019-09-19 | DRG: 378 | Disposition: A | Discharge status: Home / Self Care | Payer: Medicare Other | Attending: Internal Medicine | Admitting: Internal Medicine

## 2019-09-16 ENCOUNTER — Encounter (HOSPITAL_COMMUNITY): Payer: Self-pay | Admitting: Emergency Medicine

## 2019-09-16 ENCOUNTER — Other Ambulatory Visit: Payer: Self-pay

## 2019-09-16 ENCOUNTER — Emergency Department (HOSPITAL_COMMUNITY): Payer: Medicare Other

## 2019-09-16 ENCOUNTER — Inpatient Hospital Stay (HOSPITAL_COMMUNITY): Payer: Medicare Other

## 2019-09-16 DIAGNOSIS — Z9889 Other specified postprocedural states: Secondary | ICD-10-CM

## 2019-09-16 DIAGNOSIS — I351 Nonrheumatic aortic (valve) insufficiency: Secondary | ICD-10-CM | POA: Diagnosis not present

## 2019-09-16 DIAGNOSIS — Z6841 Body Mass Index (BMI) 40.0 and over, adult: Secondary | ICD-10-CM | POA: Diagnosis not present

## 2019-09-16 DIAGNOSIS — K3189 Other diseases of stomach and duodenum: Secondary | ICD-10-CM | POA: Diagnosis not present

## 2019-09-16 DIAGNOSIS — N400 Enlarged prostate without lower urinary tract symptoms: Secondary | ICD-10-CM | POA: Diagnosis present

## 2019-09-16 DIAGNOSIS — I110 Hypertensive heart disease with heart failure: Secondary | ICD-10-CM | POA: Diagnosis present

## 2019-09-16 DIAGNOSIS — I2510 Atherosclerotic heart disease of native coronary artery without angina pectoris: Secondary | ICD-10-CM | POA: Diagnosis present

## 2019-09-16 DIAGNOSIS — Z7984 Long term (current) use of oral hypoglycemic drugs: Secondary | ICD-10-CM | POA: Diagnosis not present

## 2019-09-16 DIAGNOSIS — Q2733 Arteriovenous malformation of digestive system vessel: Secondary | ICD-10-CM | POA: Diagnosis not present

## 2019-09-16 DIAGNOSIS — K5730 Diverticulosis of large intestine without perforation or abscess without bleeding: Secondary | ICD-10-CM | POA: Diagnosis present

## 2019-09-16 DIAGNOSIS — F1010 Alcohol abuse, uncomplicated: Secondary | ICD-10-CM | POA: Diagnosis present

## 2019-09-16 DIAGNOSIS — M109 Gout, unspecified: Secondary | ICD-10-CM

## 2019-09-16 DIAGNOSIS — Z800 Family history of malignant neoplasm of digestive organs: Secondary | ICD-10-CM

## 2019-09-16 DIAGNOSIS — I350 Nonrheumatic aortic (valve) stenosis: Secondary | ICD-10-CM

## 2019-09-16 DIAGNOSIS — I361 Nonrheumatic tricuspid (valve) insufficiency: Secondary | ICD-10-CM

## 2019-09-16 DIAGNOSIS — E119 Type 2 diabetes mellitus without complications: Secondary | ICD-10-CM | POA: Diagnosis present

## 2019-09-16 DIAGNOSIS — Z9221 Personal history of antineoplastic chemotherapy: Secondary | ICD-10-CM | POA: Diagnosis not present

## 2019-09-16 DIAGNOSIS — Z8719 Personal history of other diseases of the digestive system: Secondary | ICD-10-CM

## 2019-09-16 DIAGNOSIS — I8510 Secondary esophageal varices without bleeding: Secondary | ICD-10-CM | POA: Diagnosis present

## 2019-09-16 DIAGNOSIS — K7031 Alcoholic cirrhosis of liver with ascites: Secondary | ICD-10-CM | POA: Diagnosis not present

## 2019-09-16 DIAGNOSIS — D1800 Hemangioma unspecified site: Secondary | ICD-10-CM | POA: Diagnosis present

## 2019-09-16 DIAGNOSIS — J811 Chronic pulmonary edema: Secondary | ICD-10-CM | POA: Diagnosis present

## 2019-09-16 DIAGNOSIS — R011 Cardiac murmur, unspecified: Secondary | ICD-10-CM | POA: Diagnosis not present

## 2019-09-16 DIAGNOSIS — K922 Gastrointestinal hemorrhage, unspecified: Secondary | ICD-10-CM | POA: Diagnosis not present

## 2019-09-16 DIAGNOSIS — K7030 Alcoholic cirrhosis of liver without ascites: Secondary | ICD-10-CM | POA: Diagnosis not present

## 2019-09-16 DIAGNOSIS — K766 Portal hypertension: Secondary | ICD-10-CM | POA: Diagnosis present

## 2019-09-16 DIAGNOSIS — Z20822 Contact with and (suspected) exposure to covid-19: Secondary | ICD-10-CM | POA: Diagnosis present

## 2019-09-16 DIAGNOSIS — Z79899 Other long term (current) drug therapy: Secondary | ICD-10-CM | POA: Diagnosis not present

## 2019-09-16 DIAGNOSIS — Z7901 Long term (current) use of anticoagulants: Secondary | ICD-10-CM

## 2019-09-16 DIAGNOSIS — D689 Coagulation defect, unspecified: Secondary | ICD-10-CM | POA: Diagnosis present

## 2019-09-16 DIAGNOSIS — I4891 Unspecified atrial fibrillation: Secondary | ICD-10-CM

## 2019-09-16 DIAGNOSIS — N179 Acute kidney failure, unspecified: Secondary | ICD-10-CM | POA: Diagnosis present

## 2019-09-16 DIAGNOSIS — K7460 Unspecified cirrhosis of liver: Secondary | ICD-10-CM | POA: Diagnosis present

## 2019-09-16 DIAGNOSIS — K31811 Angiodysplasia of stomach and duodenum with bleeding: Principal | ICD-10-CM | POA: Diagnosis present

## 2019-09-16 DIAGNOSIS — D62 Acute posthemorrhagic anemia: Secondary | ICD-10-CM | POA: Diagnosis present

## 2019-09-16 DIAGNOSIS — I10 Essential (primary) hypertension: Secondary | ICD-10-CM | POA: Diagnosis not present

## 2019-09-16 DIAGNOSIS — I5032 Chronic diastolic (congestive) heart failure: Secondary | ICD-10-CM | POA: Diagnosis present

## 2019-09-16 DIAGNOSIS — D500 Iron deficiency anemia secondary to blood loss (chronic): Secondary | ICD-10-CM | POA: Diagnosis not present

## 2019-09-16 DIAGNOSIS — K760 Fatty (change of) liver, not elsewhere classified: Secondary | ICD-10-CM | POA: Diagnosis present

## 2019-09-16 DIAGNOSIS — G894 Chronic pain syndrome: Secondary | ICD-10-CM | POA: Diagnosis present

## 2019-09-16 DIAGNOSIS — K921 Melena: Secondary | ICD-10-CM | POA: Diagnosis not present

## 2019-09-16 DIAGNOSIS — Z7982 Long term (current) use of aspirin: Secondary | ICD-10-CM

## 2019-09-16 DIAGNOSIS — Z85038 Personal history of other malignant neoplasm of large intestine: Secondary | ICD-10-CM | POA: Diagnosis not present

## 2019-09-16 DIAGNOSIS — D649 Anemia, unspecified: Secondary | ICD-10-CM | POA: Diagnosis present

## 2019-09-16 DIAGNOSIS — Z951 Presence of aortocoronary bypass graft: Secondary | ICD-10-CM | POA: Diagnosis not present

## 2019-09-16 DIAGNOSIS — Z8249 Family history of ischemic heart disease and other diseases of the circulatory system: Secondary | ICD-10-CM

## 2019-09-16 DIAGNOSIS — K31819 Angiodysplasia of stomach and duodenum without bleeding: Secondary | ICD-10-CM | POA: Diagnosis present

## 2019-09-16 DIAGNOSIS — Z87891 Personal history of nicotine dependence: Secondary | ICD-10-CM

## 2019-09-16 DIAGNOSIS — I700 Atherosclerosis of aorta: Secondary | ICD-10-CM | POA: Diagnosis present

## 2019-09-16 HISTORY — DX: Unspecified cirrhosis of liver: K74.60

## 2019-09-16 HISTORY — DX: Unspecified atrial fibrillation: I48.91

## 2019-09-16 LAB — CBC
HCT: 23.5 % — ABNORMAL LOW (ref 39.0–52.0)
Hemoglobin: 7 g/dL — ABNORMAL LOW (ref 13.0–17.0)
MCH: 29.5 pg (ref 26.0–34.0)
MCHC: 29.8 g/dL — ABNORMAL LOW (ref 30.0–36.0)
MCV: 99.2 fL (ref 80.0–100.0)
Platelets: 211 10*3/uL (ref 150–400)
RBC: 2.37 MIL/uL — ABNORMAL LOW (ref 4.22–5.81)
RDW: 17.1 % — ABNORMAL HIGH (ref 11.5–15.5)
WBC: 7.3 10*3/uL (ref 4.0–10.5)
nRBC: 0.4 % — ABNORMAL HIGH (ref 0.0–0.2)

## 2019-09-16 LAB — BASIC METABOLIC PANEL
Anion gap: 10 (ref 5–15)
BUN: 32 mg/dL — ABNORMAL HIGH (ref 8–23)
CO2: 21 mmol/L — ABNORMAL LOW (ref 22–32)
Calcium: 8.7 mg/dL — ABNORMAL LOW (ref 8.9–10.3)
Chloride: 109 mmol/L (ref 98–111)
Creatinine, Ser: 1.31 mg/dL — ABNORMAL HIGH (ref 0.61–1.24)
GFR calc Af Amer: >60 mL/min (ref >60)
GFR calc non Af Amer: 54 mL/min — ABNORMAL LOW (ref >60)
Glucose, Bld: 136 mg/dL — ABNORMAL HIGH (ref 70–99)
Potassium: 4.7 mmol/L (ref 3.5–5.1)
Sodium: 140 mmol/L (ref 135–145)

## 2019-09-16 LAB — PREPARE RBC (CROSSMATCH)

## 2019-09-16 LAB — CBC WITH DIFFERENTIAL/PLATELET
Abs Immature Granulocytes: 0.04 10*3/uL (ref 0.00–0.07)
Basophils Absolute: 0 10*3/uL (ref 0.0–0.1)
Basophils Relative: 1 %
Eosinophils Absolute: 0.3 10*3/uL (ref 0.0–0.5)
Eosinophils Relative: 4 %
HCT: 19.9 % — ABNORMAL LOW (ref 39.0–52.0)
Hemoglobin: 5.3 g/dL — CL (ref 13.0–17.0)
Immature Granulocytes: 1 %
Lymphocytes Relative: 12 %
Lymphs Abs: 0.8 10*3/uL (ref 0.7–4.0)
MCH: 29.4 pg (ref 26.0–34.0)
MCHC: 26.6 g/dL — ABNORMAL LOW (ref 30.0–36.0)
MCV: 110.6 fL — ABNORMAL HIGH (ref 80.0–100.0)
Monocytes Absolute: 0.6 10*3/uL (ref 0.1–1.0)
Monocytes Relative: 9 %
Neutro Abs: 4.9 10*3/uL (ref 1.7–7.7)
Neutrophils Relative %: 73 %
Platelets: 221 10*3/uL (ref 150–400)
RBC: 1.8 MIL/uL — ABNORMAL LOW (ref 4.22–5.81)
RDW: 15.7 % — ABNORMAL HIGH (ref 11.5–15.5)
WBC: 6.6 10*3/uL (ref 4.0–10.5)
nRBC: 0.5 % — ABNORMAL HIGH (ref 0.0–0.2)

## 2019-09-16 LAB — URINALYSIS, ROUTINE W REFLEX MICROSCOPIC
Bilirubin Urine: NEGATIVE
Glucose, UA: NEGATIVE mg/dL
Ketones, ur: NEGATIVE mg/dL
Nitrite: NEGATIVE
Protein, ur: NEGATIVE mg/dL
RBC / HPF: >50 RBC/hpf — ABNORMAL HIGH (ref 0–5)
Specific Gravity, Urine: 1.011 (ref 1.005–1.030)
WBC, UA: >50 WBC/hpf — ABNORMAL HIGH (ref 0–5)
pH: 5 (ref 5.0–8.0)

## 2019-09-16 LAB — HEMOGLOBIN A1C
Hgb A1c MFr Bld: 4.4 % — ABNORMAL LOW (ref 4.8–5.6)
Mean Plasma Glucose: 79.58 mg/dL

## 2019-09-16 LAB — ECHOCARDIOGRAM COMPLETE: Weight: 5185.22 oz

## 2019-09-16 LAB — BRAIN NATRIURETIC PEPTIDE: B Natriuretic Peptide: 274.9 pg/mL — ABNORMAL HIGH (ref 0.0–100.0)

## 2019-09-16 LAB — CBG MONITORING, ED: Glucose-Capillary: 110 mg/dL — ABNORMAL HIGH (ref 70–99)

## 2019-09-16 LAB — HEPATIC FUNCTION PANEL
ALT: 21 U/L (ref 0–44)
AST: 19 U/L (ref 15–41)
Albumin: 2.7 g/dL — ABNORMAL LOW (ref 3.5–5.0)
Alkaline Phosphatase: 81 U/L (ref 38–126)
Bilirubin, Direct: <0.1 mg/dL (ref 0.0–0.2)
Total Bilirubin: 0.5 mg/dL (ref 0.3–1.2)
Total Protein: 5.4 g/dL — ABNORMAL LOW (ref 6.5–8.1)

## 2019-09-16 LAB — RETICULOCYTES
Immature Retic Fract: 42.6 % — ABNORMAL HIGH (ref 2.3–15.9)
RBC.: 1.7 MIL/uL — ABNORMAL LOW (ref 4.22–5.81)
Retic Count, Absolute: 133.3 10*3/uL (ref 19.0–186.0)
Retic Ct Pct: 7.8 % — ABNORMAL HIGH (ref 0.4–3.1)

## 2019-09-16 LAB — SAVE SMEAR(SSMR), FOR PROVIDER SLIDE REVIEW

## 2019-09-16 LAB — GLUCOSE, CAPILLARY
Glucose-Capillary: 161 mg/dL — ABNORMAL HIGH (ref 70–99)
Glucose-Capillary: 118 mg/dL — ABNORMAL HIGH (ref 70–99)

## 2019-09-16 LAB — PROTIME-INR
INR: 1.7 — ABNORMAL HIGH (ref 0.8–1.2)
Prothrombin Time: 20.2 seconds — ABNORMAL HIGH (ref 11.4–15.2)

## 2019-09-16 LAB — SARS CORONAVIRUS 2 (TAT 6-24 HRS): SARS Coronavirus 2: NEGATIVE

## 2019-09-16 LAB — APTT: aPTT: 32 seconds (ref 24–36)

## 2019-09-16 LAB — MAGNESIUM: Magnesium: 1.9 mg/dL (ref 1.7–2.4)

## 2019-09-16 IMAGING — DX DG CHEST 1V PORT
1 series · 1 of 1 positions shown · non-contrast
Comparison: Radiograph [DATE]

CLINICAL DATA: Shortness of breath. Weakness and dizziness.

EXAM:
PORTABLE CHEST 1 VIEW

[chest ap]
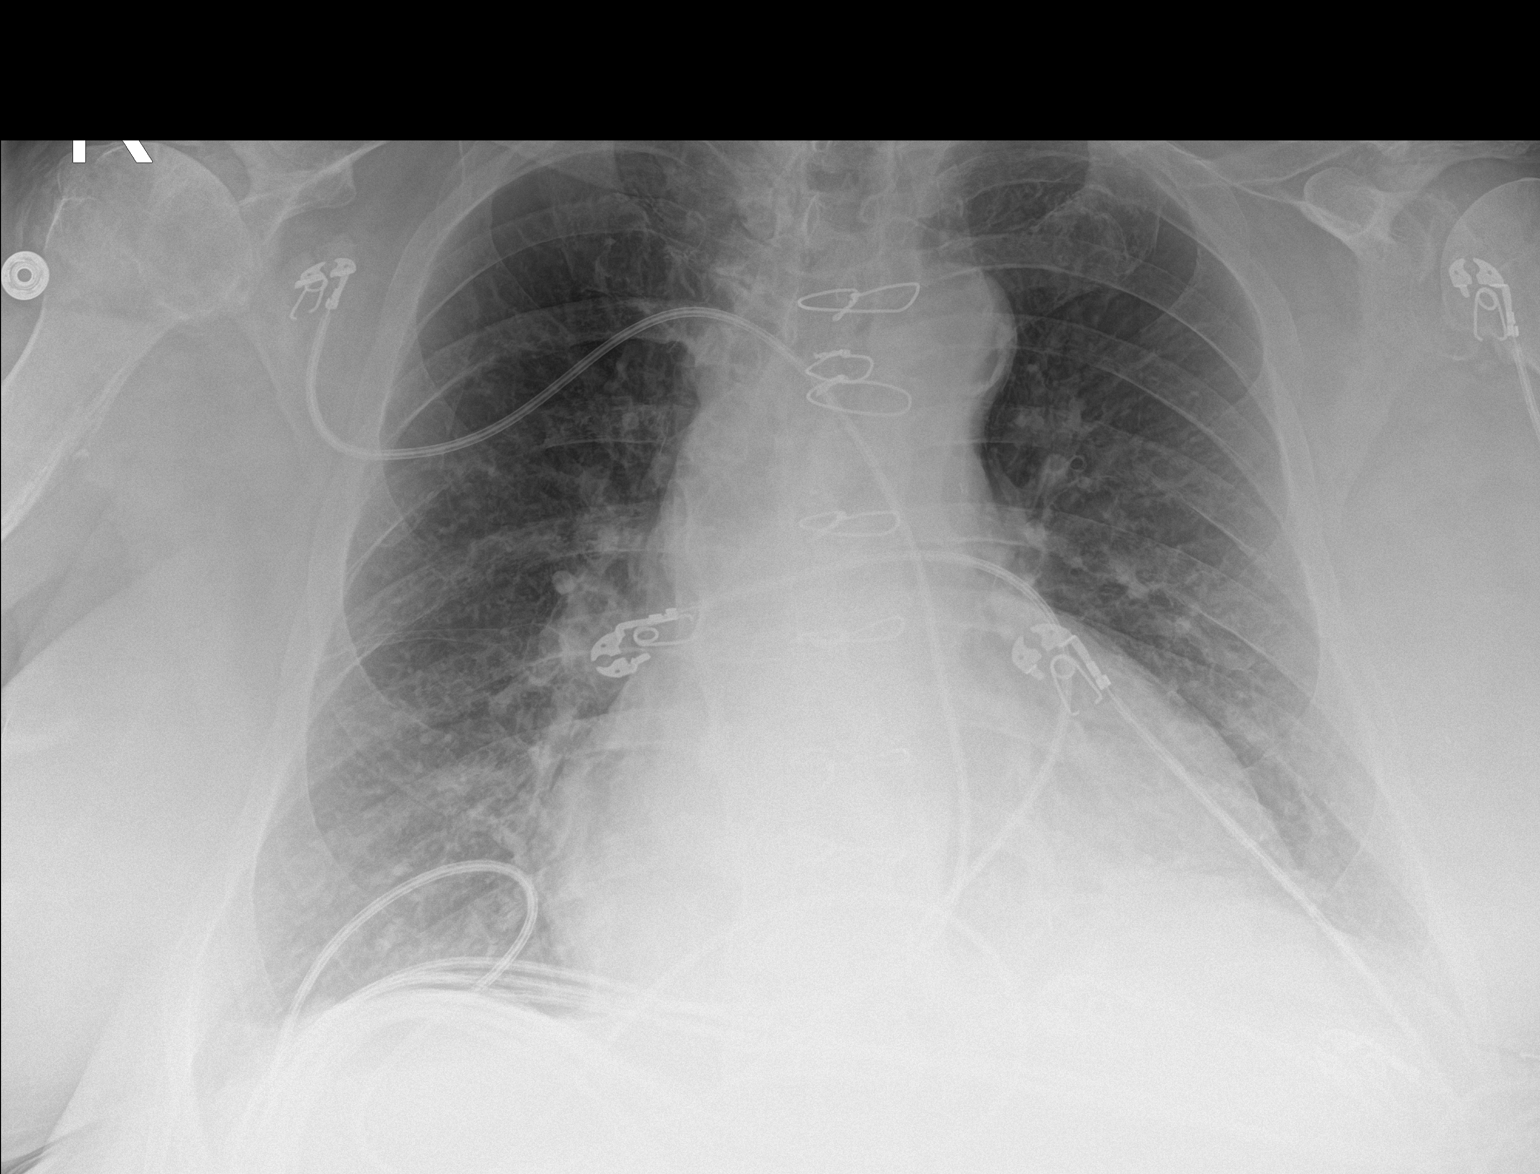

[1 of 1 positions shown; findings below may reference images not displayed]

FINDINGS: Post median sternotomy and CABG. Cardiomegaly. Stable mediastinal
contours with aortic atherosclerosis. Increased interstitial opacity
from prior exam. Possible minimal pleural effusions. No evidence of
pneumonia. No pneumothorax. Chronic change about the right shoulder.
IMPRESSION: Cardiomegaly post CABG. Increased interstitial opacity suspicious
for pulmonary edema. Possible minimal pleural effusions.

Aortic Atherosclerosis ([25]-[25]).

## 2019-09-16 MED ORDER — PEG-KCL-NACL-NASULF-NA ASC-C 100 G PO SOLR
0.5000 | Freq: Once | ORAL | Status: DC
  Filled 2019-09-16: qty 1

## 2019-09-16 MED ORDER — INSULIN ASPART 100 UNIT/ML ~~LOC~~ SOLN
0.0000 [IU] | Freq: Three times a day (TID) | SUBCUTANEOUS | Status: DC
  Administered 2019-09-16: 1 [IU] via SUBCUTANEOUS

## 2019-09-16 MED ORDER — KETOROLAC TROMETHAMINE 0.5 % OP SOLN
1.0000 [drp] | Freq: Four times a day (QID) | OPHTHALMIC | Status: DC
  Administered 2019-09-16 – 2019-09-19 (×9): 1 [drp] via OPHTHALMIC
  Filled 2019-09-16: qty 5

## 2019-09-16 MED ORDER — ADULT MULTIVITAMIN W/MINERALS CH
1.0000 | ORAL_TABLET | Freq: Every day | ORAL | Status: DC
  Administered 2019-09-18 – 2019-09-19 (×2): 1 via ORAL
  Filled 2019-09-16 (×3): qty 1

## 2019-09-16 MED ORDER — FOLIC ACID 1 MG PO TABS
1.0000 mg | ORAL_TABLET | Freq: Every day | ORAL | Status: DC
  Administered 2019-09-17 – 2019-09-19 (×3): 1 mg via ORAL
  Filled 2019-09-16 (×4): qty 1

## 2019-09-16 MED ORDER — ALLOPURINOL 300 MG PO TABS
300.0000 mg | ORAL_TABLET | Freq: Every day | ORAL | Status: DC
  Administered 2019-09-16 – 2019-09-18 (×3): 300 mg via ORAL
  Filled 2019-09-16 (×3): qty 1

## 2019-09-16 MED ORDER — PEG-KCL-NACL-NASULF-NA ASC-C 100 G PO SOLR: 1.0000 | Freq: Two times a day (BID) | ORAL | Status: DC

## 2019-09-16 MED ORDER — POLYVINYL ALCOHOL 1.4 % OP SOLN
1.0000 [drp] | Freq: Three times a day (TID) | OPHTHALMIC | Status: DC
  Administered 2019-09-16 – 2019-09-19 (×7): 1 [drp] via OPHTHALMIC
  Filled 2019-09-16: qty 15

## 2019-09-16 MED ORDER — SODIUM CHLORIDE 0.9 % IV SOLN: INTRAVENOUS | Status: DC

## 2019-09-16 MED ORDER — PANTOPRAZOLE SODIUM 40 MG PO TBEC
40.0000 mg | DELAYED_RELEASE_TABLET | Freq: Two times a day (BID) | ORAL | Status: DC
  Administered 2019-09-16 – 2019-09-19 (×5): 40 mg via ORAL
  Filled 2019-09-16 (×6): qty 1

## 2019-09-16 MED ORDER — PEG-KCL-NACL-NASULF-NA ASC-C 100 G PO SOLR: 0.5000 | Freq: Once | ORAL | Status: DC

## 2019-09-16 MED ORDER — POLYETHYLENE GLYCOL 3350 17 G PO PACK
17.0000 g | PACK | Freq: Two times a day (BID) | ORAL | Status: DC
  Filled 2019-09-16 (×4): qty 1

## 2019-09-16 MED ORDER — PEG-KCL-NACL-NASULF-NA ASC-C 100 G PO SOLR
0.5000 | Freq: Once | ORAL | Status: AC
  Administered 2019-09-17: 100 g via ORAL

## 2019-09-16 MED ORDER — GATIFLOXACIN 0.5 % OP SOLN
1.0000 [drp] | Freq: Four times a day (QID) | OPHTHALMIC | Status: DC
  Administered 2019-09-16 – 2019-09-19 (×10): 1 [drp] via OPHTHALMIC
  Filled 2019-09-16: qty 2.5

## 2019-09-16 MED ORDER — TAMSULOSIN HCL 0.4 MG PO CAPS
0.4000 mg | ORAL_CAPSULE | Freq: Every day | ORAL | Status: DC
  Administered 2019-09-16 – 2019-09-18 (×3): 0.4 mg via ORAL
  Filled 2019-09-16 (×3): qty 1

## 2019-09-16 MED ORDER — SODIUM CHLORIDE 0.9% IV SOLUTION: Freq: Once | INTRAVENOUS | Status: AC

## 2019-09-16 MED ORDER — PEG-KCL-NACL-NASULF-NA ASC-C 100 G PO SOLR
0.5000 | Freq: Once | ORAL | Status: AC
  Administered 2019-09-16: 100 g via ORAL

## 2019-09-16 MED ORDER — PRAVASTATIN SODIUM 10 MG PO TABS
10.0000 mg | ORAL_TABLET | Freq: Every day | ORAL | Status: DC
  Administered 2019-09-16 – 2019-09-18 (×3): 10 mg via ORAL
  Filled 2019-09-16 (×3): qty 1

## 2019-09-16 MED ORDER — FINASTERIDE 5 MG PO TABS
5.0000 mg | ORAL_TABLET | Freq: Every day | ORAL | Status: DC
  Administered 2019-09-17 – 2019-09-19 (×3): 5 mg via ORAL
  Filled 2019-09-16 (×3): qty 1

## 2019-09-16 MED ORDER — BISACODYL 5 MG PO TBEC
20.0000 mg | DELAYED_RELEASE_TABLET | Freq: Once | ORAL | Status: AC
  Administered 2019-09-16: 20 mg via ORAL
  Filled 2019-09-16: qty 4

## 2019-09-16 MED ORDER — SODIUM CHLORIDE 0.9% IV SOLUTION: Freq: Once | INTRAVENOUS | Status: DC

## 2019-09-16 MED ORDER — THIAMINE HCL 100 MG PO TABS
100.0000 mg | ORAL_TABLET | Freq: Every day | ORAL | Status: DC
  Administered 2019-09-18 – 2019-09-19 (×2): 100 mg via ORAL
  Filled 2019-09-16 (×3): qty 1

## 2019-09-16 MED ORDER — AMIODARONE HCL 200 MG PO TABS
200.0000 mg | ORAL_TABLET | Freq: Every day | ORAL | Status: DC
  Administered 2019-09-16 – 2019-09-19 (×4): 200 mg via ORAL
  Filled 2019-09-16 (×4): qty 1

## 2019-09-16 MED ORDER — SENNOSIDES-DOCUSATE SODIUM 8.6-50 MG PO TABS
1.0000 | ORAL_TABLET | Freq: Every day | ORAL | Status: DC
  Administered 2019-09-18 – 2019-09-19 (×2): 1 via ORAL
  Filled 2019-09-16 (×3): qty 1

## 2019-09-16 NOTE — H&P (Addendum)
Date: 09/16/2019               Patient Name:  Brandon [name] MRN: [id number]  DOB: Jun 03, 1946 Age / Sex: 74 y.o., male   PCP: Helane Rima, MD         Medical Service: Internal Medicine Teaching Service         Attending Physician: Dr. Oda Kilts, MD    First Contact: Dr. Gilford Rile Pager: [phone]  Second Contact: Dr. Maricela Bo Pager: [phone]       After Hours (After 5p/  First Contact Pager: [phone]  weekends / holidays): Second Contact Pager: [phone]   Chief Complaint: "Weakness and shortness of breath"  History of Present Illness: Brandon [name] is a 74 yo M w/ an extensive PMHx significant for conditions such as colon cancer s/p resection and chemo in 2002, CABG ~1999, DMII, HTN, Atrial fibrillation on Xarelto since 04/2019, and a GI bleed in January of 2021 who presented for weakness and dyspnea. He stated that he was feeling well up until about one week prior. He was discharged on January 19th in good health, denied weakness at that time. He denied blood in his stool, constipation, dark stool, loose stool, chest pain, cough, fever, joint aches/pain, headache, vision changes, or hematuria. He endorsed easy fatigability and generalized weakness.  As directed following his last GI bleed, he did not resume his Xarelto until two weeks after discharge. He has remained compliant on his medications and followed up with his cardiologist, Dr. Elonda Husky on 02/08 who was planning on an Echocardiogram to assess LV function and atrial size with subsequent discussion planned regarding treatment of his atrial fibrillation with ablation, were he to loose 20lbs of weight. He was also advised to stop taking Amlodipine and to start diltiazem for better rate control and due to swelling in his feet.  In the ED he was noted to be anemic prompting request for admission and evaluation by GI. Per Dr. Nelly Rout GI will see the patient and determine a treatment course.    Meds: This is list of  medications based on his last cardiology visit on February 8th, 2021.  Current Outpatient Medications  Medication Instructions  . allopurinol (ZYLOPRIM) 300 mg, Oral, Daily at bedtime  . amiodarone (PACERONE) 200 mg, Oral, Daily  . ascorbic acid (VITAMIN C) 1,000 mg, Oral, Daily  . aspirin EC 81 mg, Oral, Daily  . cholecalciferol (VITAMIN D3) 2,000 Units, Oral, 2 times daily  . diclofenac Sodium (VOLTAREN) 2 g, Topical, 4 times daily PRN  . Difluprednate (DUREZOL) 0.05 % EMUL 1 drop, Right Eye, 4 times daily  . diltiazem (CARDIZEM CD) 180 mg, Oral, Daily  . ferrous sulfate 325 mg, Oral, Daily  . finasteride (PROSCAR) 5 mg, Oral, Daily  . folic acid (FOLVITE) 1 mg, Oral, Daily  . furosemide (LASIX) 20 mg, Oral, Daily  . gatifloxacin (ZYMAXID) 0.5 % SOLN 1 drop, Right Eye, 4 times daily  . ketorolac (ACULAR) 0.5 % ophthalmic solution 1 drop, Right Eye, 4 times daily  . lisinopril (ZESTRIL) 40 mg, Oral, Daily  . magnesium oxide (MAG-OX) 400 mg, Oral, Daily  . metFORMIN (GLUCOPHAGE-XR) 500 mg, Oral, Daily with breakfast  . Multiple Vitamin (MULTIVITAMIN WITH MINERALS) TABS tablet 1 tablet, Oral, Daily  . pantoprazole (PROTONIX) 40 MG tablet Take 1 pill twice a day for 1 week. Then decrease to 1 pill daily.  . polyethylene glycol (MIRALAX / GLYCOLAX) 17 g, Oral, 2 times daily  . polyvinyl alcohol (  ARTIFICIAL TEARS) 1.4 % ophthalmic solution 1 drop, Left Eye, 3 times daily  . potassium chloride (KLOR-CON) 10 MEQ tablet 10 mEq, Oral, Daily  . pravastatin (PRAVACHOL) 10 mg, Oral, Daily at bedtime  . senna-docusate (SENOKOT-S) 8.6-50 MG tablet 1 tablet, Oral, 2 times daily  . tamsulosin (FLOMAX) 0.4 mg, Oral, Daily at bedtime  . thiamine 100 mg, Oral, Daily  . Xarelto 20 mg, Oral, Daily      Allergies: Allergies as of 09/16/2019  . (No Known Allergies)   Past Medical History:  Diagnosis Date  . Anemia   . Colon cancer ([place])   . Coronary artery disease   . Diabetes mellitus without  complication ([place])   . Dysrhythmia   . 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 GI bleed 07/2019  . Obesity 07/2019   Family History:  Family History  Problem Relation Age of Onset  . Colon cancer Father   . CAD Other   . Colon cancer Other   . Diabetes Neg Hx    Social History:  Lives with his mother and has a son nearby. Feels he has adequate support at home Does not smoke, stopped >30 years prior Does not drink since last year Denied illicit drugs  Review of Systems: A complete ROS was negative except as per HPI.   Physical Exam: Blood pressure 125/65, pulse 93, temperature 98.3 F (36.8 C), temperature source Oral, resp. rate 18, weight (!) 147 kg, SpO2 100 %. Physical Exam Constitutional:      General: He is not in acute distress.    Appearance: He is well-developed. He is not diaphoretic.  HENT:     Head: Normocephalic and atraumatic.  Eyes:     Extraocular Movements: Extraocular movements intact.     Conjunctiva/sclera: Conjunctivae normal.     Pupils: Pupils are equal, round, and reactive to light.  Cardiovascular:     Rate and Rhythm: Normal rate. Rhythm irregular.     Heart sounds: Murmur present.  Pulmonary:     Effort: Pulmonary effort is normal. No respiratory distress.     Breath sounds: Normal breath sounds. No stridor.  Chest:     Chest wall: No deformity.  Abdominal:     General: Bowel sounds are normal. There is no distension.     Palpations: Abdomen is soft.  Musculoskeletal:     Cervical back: Normal range of motion.     Right lower leg: Edema present.     Left lower leg: Edema present.  Skin:    General: Skin is warm and dry.     Capillary Refill: Capillary refill takes less than 2 seconds.     Findings: Rash present.  Neurological:     General: No focal deficit present.     Mental Status: He is alert and oriented to person, place, and time.  Psychiatric:        Mood and Affect: Mood normal.        Behavior:  Behavior normal.    EKG: personally reviewed my interpretation is atrial fibrillation with baseline wondering and minimal change from prior  CXR: personally reviewed my interpretation is that there are increased interstitial opacities which I agree likely represent pulmonary edema   Assessment & Plan by Problem: Principal Problem:   Symptomatic anemia Active Problems:   History of colon cancer   CAD (coronary artery disease)   DM (diabetes mellitus), type 2 (HCC)   Atrial fibrillation ([place])  Assessment: Manny Wollert is a  74 yo M w/ an extensive PMHx significant for conditions such as colon cancer s/p resection and chemo in 2002, CABG ~1999, DMII, HTN, Atrial fibrillation on Xarelto since 04/2019, and a GI bleed in January of 2021 who presented for weakness and dyspnea. His symptoms appear to be predominantly 2/2 to anemia from likely recurrent GI loses in the setting of Xarelto. I do not doubt that there is a high probability that he has a degree of CHF as well playing a role in his dyspnea given his CAD, atrial fibrillation, exam findings and CABG > 20 years ago. He is being admitted for symptomatic anemia treatment and evaluation.   Plan: Symptomatic anemia: Hx of Colon cancer: Cirrhosis: As above. GI has been consulted to consider repeat upper GI. On his prior there was noted to be Hematin (altered blood/coffee-ground-like material) in the entire stomach as well as a single bleeding angioectasia in the stomach which was treated with argon plasma coagulation with good results. I feel that his continued use of Xarelto has predisposed him to this and am not certain he will be able to continue his anticoagulation moving forward. 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 Outpatient MRI with liver mass protocol recommended for further evaluation.  Plan: -Transfuse two units of PRBC's due to ongoing bleed with Hgb of 5.3 as  we will expect only an ~1g increase in Hgb per unit with a goal of 7 -Type and screen completed -Hold antihypertensives and lasix due to normal BP with anemia -Appreciate GI's recommendations -CBC post transfusion -CBC at 1800 hours -Hold Xarelto in the setting of recurrent GI bleed -Add on a retic count and smear given for better evaluation of his anemia given his elevated MCV -Pantoprazole 40mg  BID PO  CAD: Hx of CABG: CHF?: Pedal edema: Likely a component of CHF based on his pedal edema, pulmonary edema and A-fib. He was to see his cardiologist today to discuss an echo. I am surprised we do not have one already. We will hold his anticoagulants and lasix today due to his anemia and soft BP. BNP elevated to 245 but may be lower than expected for degree of CHF due to patients BMI.  Plan: -Echo today -Resume lasix when BP and Hgb improved -Discontinue amlodipine  -Holding lisinopril   DMII: We will hold his Metformin while admitted for his well controlled DMII. Plan: -SSI sensitive  Atrial fibrillation: First noted last year for which he was started on Xarelto. The patient has both an elevated stroke risk as well as a bleeding risk. We will need to discuss the risks vs benefits of continuing the anticoagulation following evaluation by GI.  Plan: -Continue Amiodarone 200mg  daily -Hold xarelto  -Appreciate GI's recs -Tele admit -Hold diltiazem until BP improved   HTN: Holding home diltiazem and lisinopril while he his BP is soft. Resume when able.   Gout: Resume home allopurinol to prevent recurrence during acute illness.   BPH: Continue home tamsulosin and finasteride.   Diet: NPO with meds until GI sees him Code: DNR per patient request DVT PPX: SCD's given bleed  Dispo: Admit patient to Inpatient with expected length of stay greater than 2 midnights.  Signed: Kathi Ludwig, MD 09/16/2019, 9:27 AM

## 2019-09-16 NOTE — Plan of Care (Signed)
  Problem: Education: Goal: Knowledge of General Education information will improve Description Including pain rating scale, medication(s)/side effects and non-pharmacologic comfort measures Outcome: Progressing   

## 2019-09-16 NOTE — ED Provider Notes (Signed)
[facility] EMERGENCY DEPARTMENT Provider Note   CSN: [id number] Arrival date & time: 09/16/19  0426     History Chief Complaint  Patient presents with  . Shortness of Breath  . Weakness    Brandon [name] is a 74 y.o. male.  HPI     74 year old male comes in a chief complaint of shortness of breath and weakness. Patient has history of colon cancer, diabetes, angiodysplasia of the stomach, A. fib on Xarelto. Patient reports that over the last week or so he has been having progressing shortness of breath.  He gets winded and tired easily, often with just walking from one room to another.  His symptoms got severe last night and he decided to come in.  Patient was admitted to the hospital for symptomatic anemia last month.  He was noted to have angiodysplasia during his upper GI scope.  He is back on his Xarelto and his last dose was last night.  He has not seen any bloody or dark tarry stools.  He denies any underlying kidney problems but history does indicate that he has some liver disease.  Past Medical History:  Diagnosis Date  . Anemia   . Colon cancer ([place])   . Coronary artery disease   . Diabetes mellitus without complication ([place])   . Dysrhythmia   . 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 GI bleed 07/2019  . Obesity 07/2019    Patient Active Problem List   Diagnosis Date Noted  . Angiodysplasia of stomach with hemorrhage   . Gastrointestinal hemorrhage   . Atrial fibrillation ([place])   . Chronic pain syndrome   . Symptomatic anemia 08/13/2019  . Acute GI bleeding 08/13/2019  . Avulsion of right patellar tendon   . Closed displaced fracture of phalanx of toe of right foot 10/06/2018  . 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 Patella fracture, extensor mechanism disruption R knee  09/29/2018  . Hyponatremia 08/23/2018  . Obstipation 08/23/2018  . Colon cancer ([place]) 08/23/2018  . CAD (coronary artery disease) 08/23/2018  . DM  (diabetes mellitus), type 2 ([place]) 08/23/2018  . Pyuria 08/23/2018    Past Surgical History:  Procedure Laterality Date  . APPLICATION OF WOUND VAC Right 09/30/2018   Procedure: Application Of Wound Vac;  Surgeon: Altamese Earlville, MD;  Location: [facility];  Service: Orthopedics;  Laterality: Right;  . CORONARY ARTERY BYPASS GRAFT     in his 49s  . ESOPHAGOGASTRODUODENOSCOPY (EGD) WITH PROPOFOL N/A 08/14/2019   Procedure: ESOPHAGOGASTRODUODENOSCOPY (EGD) WITH PROPOFOL;  Surgeon: [name], MD;  Location: [facility];  Service: Gastroenterology;  Laterality: N/A;  . HOT HEMOSTASIS N/A 08/14/2019   Procedure: HOT HEMOSTASIS (ARGON PLASMA COAGULATION/BICAP);  Surgeon: [name], MD;  Location: [facility] ENDOSCOPY;  Service: Gastroenterology;  Laterality: N/A;  . PATELLAR TENDON REPAIR Right 09/30/2018   Procedure: PATELLA TENDON REPAIR;  Surgeon: Altamese [place], MD;  Location: [facility];  Service: Orthopedics;  Laterality: Right;       Family History  Problem Relation Age of Onset  . Colon cancer Father   . CAD Other   . Colon cancer Other   . Diabetes Neg Hx     Social History   Tobacco Use  . Smoking status: Former Smoker    Types: Cigarettes    Quit date: 1990    Years since quitting: 31.1  . Smokeless tobacco: Never Used  Substance Use Topics  . Alcohol use: Yes  . Drug  use: Never    Home Medications Prior to Admission medications   Medication Sig Start Date End Date Taking? Authorizing Provider  allopurinol (ZYLOPRIM) 300 MG tablet Take 300 mg by mouth at bedtime.  08/12/18   [provider]  amLODipine (NORVASC) 5 MG tablet Take 5 mg by mouth daily.  02/18/18   [provider]  aspirin EC 81 MG tablet Take 81 mg by mouth daily.    [provider]  cholecalciferol (VITAMIN D3) 25 MCG (1000 UT) tablet Take 2 tablets (2,000 Units total) by mouth 2 (two) times daily. Patient taking differently: Take 2,000 Units by mouth daily.  10/08/18   [name], DO   diclofenac Sodium (VOLTAREN) 1 % GEL Apply 2 g topically 4 (four) times daily as needed (pain). 08/16/19   Bonnita Hollow, MD  Difluprednate (DUREZOL) 0.05 % EMUL Place 1 drop into the right eye 4 (four) times daily.    [provider]  ferrous sulfate 325 (65 FE) MG tablet Take 325 mg by mouth daily. 11/15/15 11/15/19  [provider]  finasteride (PROSCAR) 5 MG tablet Take 5 mg by mouth daily. 02/18/18   [provider]  folic acid (FOLVITE) 1 MG tablet Take 1 tablet (1 mg total) by mouth daily. Patient not taking: Reported on 08/13/2019 10/09/18   [name] C, DO  gatifloxacin (ZYMAXID) 0.5 % SOLN Place 1 drop into the right eye 4 (four) times daily.    [provider]  ketorolac (ACULAR) 0.5 % ophthalmic solution Place 1 drop into the right eye 4 (four) times daily. 08/13/19   [provider]  lisinopril (ZESTRIL) 40 MG tablet Take 40 mg by mouth daily.    [provider]  magnesium oxide (MAG-OX) 400 (241.3 Mg) MG tablet Take 1 tablet (400 mg total) by mouth daily. Patient not taking: Reported on 08/13/2019 08/25/18   [name], MD  MAGNESIUM PO Take 1 tablet by mouth daily.    [provider]  metFORMIN (GLUCOPHAGE-XR) 500 MG 24 hr tablet Take 500 mg by mouth daily with breakfast.  02/18/18   [provider]  Multiple Vitamin (MULTIVITAMIN WITH MINERALS) TABS tablet Take 1 tablet by mouth daily.    [provider]  NONFORMULARY OR COMPOUNDED ITEM Apply 1 application topically 2 (two) times daily as needed (rash). Triamcinolone 0.1% and Aquaphor compound steroid cream, 3:1 ratio, 1 pound jar 07/14/18   [provider]  pantoprazole (PROTONIX) 40 MG tablet Take 1 pill twice a day for 1 week. Then decrease to 1 pill daily. 08/16/19   Bonnita Hollow, MD  polyethylene glycol [employer] Floria Raveling) packet Take 17 g by mouth 2 (two) times daily. Patient taking differently: Take 17 g by mouth daily. Mix  in coffee and drink 10/08/18   [name] C, DO  polyvinyl alcohol (ARTIFICIAL TEARS) 1.4 % ophthalmic solution Place 1 drop into the left eye 3 (three) times daily.    [provider]  pravastatin (PRAVACHOL) 10 MG tablet Take 10 mg by mouth at bedtime. 02/18/18   [provider]  senna-docusate (SENOKOT-S) 8.6-50 MG tablet Take 1 tablet by mouth 2 (two) times daily. Patient taking differently: Take 1 tablet by mouth daily.  10/08/18   [name], DO  tamsulosin (FLOMAX) 0.4 MG CAPS capsule Take 0.4 mg by mouth at bedtime. 02/18/18   [provider]  thiamine 100 MG tablet Take 1 tablet (100 mg total) by mouth daily. Patient not taking:  Reported on 08/13/2019 10/09/18   [name], DO  vitamin C (VITAMIN C) 1000 MG tablet Take 1 tablet (1,000 mg total) by mouth daily. 10/09/18   [name], DO    Allergies    Patient has no known allergies.  Review of Systems   Review of Systems  Constitutional: Positive for activity change.  Respiratory: Positive for shortness of breath.   Cardiovascular: Negative for chest pain.  Gastrointestinal: Negative for nausea and vomiting.  Neurological: Positive for dizziness, weakness and light-headedness.  Hematological: Bruises/bleeds easily.  All other systems reviewed and are negative.   Physical Exam Updated Vital Signs BP 119/68   Pulse 80   Temp 98.3 F (36.8 C) (Oral)   Resp 16   Wt (!) 147 kg   SpO2 100%   BMI 52.31 kg/m   Physical Exam Vitals and nursing note reviewed.  Constitutional:      Appearance: He is well-developed.  HENT:     Head: Atraumatic.  Cardiovascular:     Rate and Rhythm: Normal rate.  Pulmonary:     Effort: Pulmonary effort is normal.  Musculoskeletal:     Cervical back: Neck supple.     Right lower leg: Edema present.     Left lower leg: Edema present.  Skin:    General: Skin is warm.  Neurological:     Mental Status: He is alert and oriented to person,  place, and time.     ED Results / Procedures / Treatments   Labs (all labs ordered are listed, but only abnormal results are displayed) Labs Reviewed  BASIC METABOLIC PANEL - Abnormal; Notable for the following components:      Result Value   CO2 21 (*)    Glucose, Bld 136 (*)    BUN 32 (*)    Creatinine, Ser 1.31 (*)    Calcium 8.7 (*)    GFR calc non Af Amer 54 (*)    All other components within normal limits  CBC WITH DIFFERENTIAL/PLATELET - Abnormal; Notable for the following components:   RBC 1.80 (*)    Hemoglobin 5.3 (*)    HCT 19.9 (*)    MCV 110.6 (*)    MCHC 26.6 (*)    RDW 15.7 (*)    nRBC 0.5 (*)    All other components within normal limits  PROTIME-INR - Abnormal; Notable for the following components:   Prothrombin Time 20.2 (*)    INR 1.7 (*)    All other components within normal limits  SARS CORONAVIRUS 2 (TAT 6-24 HRS)  MAGNESIUM  APTT  BRAIN NATRIURETIC PEPTIDE  URINALYSIS, ROUTINE W REFLEX MICROSCOPIC  HEPATIC FUNCTION PANEL  TYPE AND SCREEN    EKG EKG Interpretation  Date/Time:  Friday September 16 2019 04:36:30 EST Ventricular Rate:  86 PR Interval:    QRS Duration: 103 QT Interval:  379 QTC Calculation: 454 R Axis:   43 Text Interpretation: Atrial fibrillation Low voltage, precordial leads Borderline T abnormalities, inferior leads No acute changes No significant change since last tracing Confirmed by Varney Biles [id number]) on 09/16/2019 5:15:16 AM   Radiology DG Chest Portable 1 View  Result Date: 09/16/2019 CLINICAL DATA:  Shortness of breath. Weakness and dizziness. EXAM: PORTABLE CHEST 1 VIEW COMPARISON:  Radiograph 08/13/2019 FINDINGS: Post median sternotomy and CABG. Cardiomegaly. Stable mediastinal contours with aortic atherosclerosis. Increased interstitial opacity from prior exam. Possible minimal pleural effusions. No evidence of pneumonia. No pneumothorax. Chronic change about the right shoulder. IMPRESSION: Cardiomegaly post  CABG.  Increased interstitial opacity suspicious for pulmonary edema. Possible minimal pleural effusions. Aortic Atherosclerosis (ICD10-I70.0). Electronically Signed   By: Keith Rake M.D.   On: 09/16/2019 05:39    Procedures .Critical Care Performed by: Varney Biles, MD Authorized by: Varney Biles, MD   Critical care provider statement:    Critical care time (minutes):  36   Critical care was time spent personally by me on the following activities:  Discussions with consultants, evaluation of patient's response to treatment, examination of patient, ordering and performing treatments and interventions, ordering and review of laboratory studies, ordering and review of radiographic studies, pulse oximetry, re-evaluation of patient's condition, obtaining history from patient or surrogate and review of old charts   (including critical care time)  Medications Ordered in ED Medications - No data to display  ED Course  I have reviewed the triage vital signs and the nursing notes.  Pertinent labs & imaging results that were available during my care of the patient were reviewed by me and considered in my medical decision making (see chart for details).    MDM Rules/Calculators/A&P                      74 year old male comes in a chief complaint of shortness of breath.  He has history of liver cirrhosis, symptomatic anemia, A. fib on Xarelto.   He is having exertional shortness of breath. Differential diagnosis includes CHF, ACS, symptomatic anemia, pleural effusion.  X-ray shows interstitial edema.  BNP is pending still.  He has pitting edema in lower extremities that allegedly is getting worse.  There is likely a minor component of pulmonary edema.  Additionally his hemoglobin is 5.9.  We will start transfusion. Patient is consented for it.  I spoke with Dr. [name], GI -they will see the patient. Medicine consulted. Results of the ED work-up discussed with the  patient.  Final Clinical Impression(s) / ED Diagnoses Final diagnoses:  Symptomatic anemia    Rx / DC Orders ED Discharge Orders    None       Varney Biles, MD 09/16/19 [phone]

## 2019-09-16 NOTE — ED Triage Notes (Signed)
Pt in via [facility] with sob, worse x 36hrs. Also c/o generalized weakness and dizziness x 1 wk. Hx of recently dx afib, rate 80-100's en route. Per EMS, pt was 98% on RA, but went down to 89% when walking to stretcher. Arrives on [facility], able to speak in short sentences, gets winded.

## 2019-09-16 NOTE — Progress Notes (Signed)
  Echocardiogram 2D Echocardiogram has been performed.  Darlina Sicilian M 09/16/2019, 11:40 AM

## 2019-09-16 NOTE — Consult Note (Addendum)
[facility] Consult: 10:26 AM 09/16/2019  LOS: 0 days    Referring Provider: Dr Rebeca Alert  Primary Care Physician:  Helane Rima, MD [facility].   Primary Gastroenterologist:  Dr Hilarie Fredrickson.  Has ROV set for 09/27/19.    Primary cardiologist: Loni Beckwith MD of [facility] in [place].   Reason for Consultation: Anemia, FOBT positive.   HPI: Brandon [name] is a 74 y.o. male.  PMH resection of colon cancer 1999, treated surgically and with chemotherapy.  DM 2 on oral agents.  BPH.  CAD.  S/p  CABG ~ 1999.  A. Fib, onset a few months ago and started on Xarelto. EtOH abuse, claims abstinence for last 10 months.  Morbid obesity.  Gout.  DJD.  Htn.  Chronic pain syndrome.  Cirrhosis, uncomplicated gallstones supraumbilical fat-containing hernia on 08/24/2018 CTAP with contrast.   Last colonoscopy (not performed in [place] ) 2010 at which point 2 polyps of unclear type were removed.  Pt says the cancer was resting on the liver but he had no liver mets and underwent subsequent biopsy of what he describes as benign liver.  Admitted 1/16 - 08/16/2019.  Received total of 3 PRBCs, Hgb 6.7 >> 8.8.  INR 2.6.  Discharged on 81 ASA, iron 325 mg/day, pantoprazole 40 mg bid x 7 d then once weekly.  Xarelto resumed ~ 08/30/2019.   08/14/2019 EGD for FOBT+ anemia.  Gastroesophageal flap, Hill grade 3.  Hematin, clotted blood throughout stomach.  Bleeding AVM at greater curvature treated with APC.  Normal duodenum.  No signs of portal hypertensive gastropathy, gastric or esophageal varices. Has not had colonoscopy since 2009 08/14/2019 CTAP with contrast: Cirrhosis.  5.9 cm ill-defined, stable, hepatic mass with possible small calcification centered within the mass.  CBD, pancreas normal.  Splenomegaly.  Small volume  ascites.  Esophageal varices.  Nonaneurysmal, calcified abdominal aorta.  Distended urinary bladder.  Presented to the ED early this morning with symptomatic anemia.  1 week weakness, dizziness, 2 days of shortness of breath/DOE. Denies bloody, tarry stools, nausea, vomiting, abdominal pain.  No anorexia or significant weight fluctuation.  Lower extremity edema since starting Xarelto, this is unchanged. Last Xarelto was evening of 09/15/2019 Ran out of Protonix 1 week ago.    Hgb 5.3.  No thrombocytopenia.  INR 1.7.  FOBT testing not yet repeated + AKI.  Other than low albumin 2.7, LFTs normal. Urine remarkable for abundant bacteria, RBCs, WBCs, large leukocytes.  No orders for urine clx  Covid 19 pndg.   PRBCs x 2 ordered.   CXR with changes suspicious for pulmonary edema and minor pleural effusions.  Pts father had colon cancer as well.    Past Medical History:  Diagnosis Date  . Anemia   . Colon cancer ([place])   . Coronary artery disease   . Diabetes mellitus without complication ([place])   . Dysrhythmia   . 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 GI bleed 07/2019  . Obesity 07/2019    Past Surgical History:  Procedure Laterality Date  .  APPLICATION OF WOUND VAC Right 09/30/2018   Procedure: Application Of Wound Vac;  Surgeon: Altamese [place], MD;  Location: [facility];  Service: Orthopedics;  Laterality: Right;  . CORONARY ARTERY BYPASS GRAFT     in his 30s  . ESOPHAGOGASTRODUODENOSCOPY (EGD) WITH PROPOFOL N/A 08/14/2019   Procedure: ESOPHAGOGASTRODUODENOSCOPY (EGD) WITH PROPOFOL;  Surgeon: [name], MD;  Location: [facility];  Service: Gastroenterology;  Laterality: N/A;  . HOT HEMOSTASIS N/A 08/14/2019   Procedure: HOT HEMOSTASIS (ARGON PLASMA COAGULATION/BICAP);  Surgeon: [name], MD;  Location[facility] ENDOSCOPY;  Service: Gastroenterology;  Laterality: N/A;  . PATELLAR TENDON REPAIR Right 09/30/2018   Procedure: PATELLA TENDON REPAIR;  Surgeon: Altamese [place], MD;  Location: [facility];  Service: Orthopedics;  Laterality: Right;    Prior to Admission medications   Medication Sig Start Date End Date Taking? Authorizing Provider  allopurinol (ZYLOPRIM) 300 MG tablet Take 300 mg by mouth at bedtime.  08/12/18  Yes [provider]  amiodarone (PACERONE) 200 MG tablet Take 200 mg by mouth daily. 09/05/19  Yes [provider]  aspirin EC 81 MG tablet Take 81 mg by mouth daily.   Yes [provider]  cholecalciferol (VITAMIN D3) 25 MCG (1000 UT) tablet Take 2 tablets (2,000 Units total) by mouth 2 (two) times daily. Patient taking differently: Take 2,000 Units by mouth daily.  10/08/18  Yes [name] C, DO  diclofenac Sodium (VOLTAREN) 1 % GEL Apply 2 g topically 4 (four) times daily as needed (pain). 08/16/19  Yes Bonnita Hollow, MD  Difluprednate (DUREZOL) 0.05 % EMUL Place 1 drop into the right eye 4 (four) times daily.   Yes [provider]  diltiazem (CARDIZEM CD) 180 MG 24 hr capsule Take 180 mg by mouth daily. 09/05/19  Yes [provider]  ferrous sulfate 325 (65 FE) MG tablet Take 325 mg by mouth daily. 11/15/15 11/15/19 Yes [provider]  finasteride (PROSCAR) 5 MG tablet Take 5 mg by mouth daily. 02/18/18  Yes [provider]  folic acid (FOLVITE) 1 MG tablet Take 1 tablet (1 mg total) by mouth daily. 10/09/18  Yes [name] C, DO  furosemide (LASIX) 20 MG tablet Take 20 mg by mouth daily. 09/05/19  Yes [provider]  gatifloxacin (ZYMAXID) 0.5 % SOLN Place 1 drop into the right eye 4 (four) times daily.   Yes [provider]  ketorolac (ACULAR) 0.5 % ophthalmic solution Place 1 drop into the right eye 4 (four) times daily. 08/13/19  Yes [provider]  lisinopril (ZESTRIL) 40 MG tablet Take 40 mg by mouth daily.   Yes [provider]  magnesium oxide (MAG-OX) 400 (241.3 Mg) MG tablet Take 1 tablet (400 mg total) by mouth daily. 08/25/18   Yes [name], MD  metFORMIN (GLUCOPHAGE-XR) 500 MG 24 hr tablet Take 500 mg by mouth daily with breakfast.  02/18/18  Yes [provider]  Multiple Vitamin (MULTIVITAMIN WITH MINERALS) TABS tablet Take 1 tablet by mouth daily.   Yes [provider]  pantoprazole (PROTONIX) 40 MG tablet Take 1 pill twice a day for 1 week. Then decrease to 1 pill daily. 08/16/19  Yes Bonnita Hollow, MD  polyethylene glycol [facility] / GLYCOLAX) packet Take 17 g by mouth 2 (two) times daily. Patient taking differently: Take 17 g by mouth daily. Mix in coffee and drink 10/08/18  Yes [name] C, DO  polyvinyl alcohol (ARTIFICIAL TEARS) 1.4 % ophthalmic solution Place 1  drop into the left eye 3 (three) times daily.   Yes [provider]  potassium chloride (KLOR-CON) 10 MEQ tablet Take 10 mEq by mouth daily. 09/05/19  Yes [provider]  pravastatin (PRAVACHOL) 10 MG tablet Take 10 mg by mouth at bedtime. 02/18/18  Yes [provider]  senna-docusate (SENOKOT-S) 8.6-50 MG tablet Take 1 tablet by mouth 2 (two) times daily. Patient taking differently: Take 1 tablet by mouth daily.  10/08/18  Yes [name] C, DO  tamsulosin (FLOMAX) 0.4 MG CAPS capsule Take 0.4 mg by mouth at bedtime. 02/18/18  Yes [provider]  thiamine 100 MG tablet Take 1 tablet (100 mg total) by mouth daily. 10/09/18  Yes [name] C, DO  vitamin C (VITAMIN C) 1000 MG tablet Take 1 tablet (1,000 mg total) by mouth daily. 10/09/18  Yes Anderson, Hannah C, DO  XARELTO 20 MG TABS tablet Take 20 mg by mouth daily. 09/05/19  Yes [provider]    Scheduled Meds: . sodium chloride   Intravenous Once  . allopurinol  300 mg Oral QHS  . amiodarone  200 mg Oral Daily  . finasteride  5 mg Oral Daily  . folic acid  1 mg Oral Daily  . gatifloxacin  1 drop Right Eye QID  . insulin aspart  0-6 Units Subcutaneous TID WC  . ketorolac  1 drop Right Eye QID  . multivitamin with  minerals  1 tablet Oral Daily  . pantoprazole  40 mg Oral BID  . polyethylene glycol  17 g Oral BID  . polyvinyl alcohol  1 drop Left Eye TID  . pravastatin  10 mg Oral QHS  . senna-docusate  1 tablet Oral Daily  . tamsulosin  0.4 mg Oral QHS  . thiamine  100 mg Oral Daily   Infusions:  PRN Meds:    Allergies as of 09/16/2019  . (No Known Allergies)    Family History  Problem Relation Age of Onset  . Colon cancer Father   . CAD Other   . Colon cancer Other   . Diabetes Neg Hx     Social History   Socioeconomic History  . Marital status: Widowed    Spouse name: Not on file  . Number of children: Not on file  . Years of education: Not on file  . Highest education level: Not on file  Occupational History  . Not on file  Tobacco Use  . Smoking status: Former Smoker    Types: Cigarettes    Quit date: 1990    Years since quitting: 31.1  . Smokeless tobacco: Never Used  Substance and Sexual Activity  . Alcohol use: Yes  . Drug use: Never  . Sexual activity: Not Currently  Other Topics Concern  . Not on file  Social History Narrative  . Not on file   Social Determinants of Health   Financial Resource Strain:   . Difficulty of Paying Living Expenses: Not on file  Food Insecurity:   . Worried About Charity fundraiser in the Last Year: Not on file  . Ran Out of Food in the Last Year: Not on file  Transportation Needs:   . Lack of Transportation (Medical): Not on file  . Lack of Transportation (Non-Medical): Not on file  Physical Activity:   . Days of Exercise per Week: Not on file  . Minutes of Exercise per Session: Not on file  Stress:   . Feeling of Stress : Not on  file  Social Connections:   . Frequency of Communication with Friends and Family: Not on file  . Frequency of Social Gatherings with Friends and Family: Not on file  . Attends Religious Services: Not on file  . Active Member of Clubs or Organizations: Not on file  . Attends Theatre manager Meetings: Not on file  . Marital Status: Not on file  Intimate Partner Violence:   . Fear of Current or Ex-Partner: Not on file  . Emotionally Abused: Not on file  . Physically Abused: Not on file  . Sexually Abused: Not on file    REVIEW OF SYSTEMS: Constitutional: Fatigue ENT:  No nose bleeds Pulm: Dyspnea.  No cough. CV:  No palpitations, no CP.   Chronic, stable lower extremity edema..  GU:  No hematuria, no frequency GI: See HPI.  His lower abdominal wall hernia is asymptomatic. Heme: Denies excessive bleeding or bruising. Transfusions: See HPI. Neuro:  No headaches, no peripheral tingling or numbness.  Syncope, no seizures. Derm: Currently experiencing what he describes as seasonal pruritic rash on his trunk and limbs.  This generally starts around Thanksgiving and resolves in the spring. Endocrine:  No sweats or chills.  No polyuria or dysuria Immunization: Had flu shot in 07/2019. Travel:  None beyond local counties in last few months.    PHYSICAL EXAM: Vital signs in last 24 hours: Vitals:   09/16/19 1000 09/16/19 1007  BP: (!) 104/55 (!) 104/57  Pulse: 90 87  Resp: (!) 23 17  Temp:  98.4 F (36.9 C)  SpO2: 100% 100%   Wt Readings from Last 3 Encounters:  09/16/19 (!) 147 kg  08/14/19 (!) 147 kg  09/30/18 (!) 147 kg    General: Morbidly obese, somewhat chronically ill but comfortable WM in no distress. Head: No facial asymmetry or swelling.  No signs of head trauma. Eyes: Conjunctiva pale.  No scleral icterus.  EOMI Ears: Slightly diminished hearing. Nose: No congestion or discharge. Mouth: Many teeth are absent, remainder look healthy.  Mucosa pink, moist, clear.  Tongue midline Neck: No JVD, no masses, no thyromegaly Lungs: Clear bilaterally.  No labored breathing, rales, rhonchi. Heart: Irregularly irregular, not tacky or bradycardic.  No MRG.  S1, S2 present Abdomen: Obese.  Active bowel sounds.  Lower midline ventral hernia.  No tenderness.   Do not appreciate organomegaly, bruits..   Rectal: Deferred GU: No scrotal or penile edema Musc/Skeltl: Dowagers hump on lower cervical spine.  No gross joint deformities or swelling. Extremities: 1+ pitting edema in both legs, feet. Neurologic: Alert.  Oriented x3.  Moves all 4 limbs, strength not tested.  No tremors.  No gross deficits. Skin: Couple punctate scabbed lesions and some linear scabs on the trunk, less so on the arms and legs. Tattoos: None observed Nodes: No cervical adenopathy Psych: Operative, calm, pleasant.  Fluid speech.  Intake/Output from previous day: No intake/output data recorded. Intake/Output this shift: No intake/output data recorded.  LAB RESULTS: Recent Labs    09/16/19 0559  WBC 6.6  HGB 5.3*  HCT 19.9*  PLT 221   BMET Lab Results  Component Value Date   NA 140 09/16/2019   NA 140 08/16/2019   NA 138 08/15/2019   K 4.7 09/16/2019   K 4.0 08/16/2019   K 3.7 08/15/2019   CL 109 09/16/2019   CL 109 08/16/2019   CL 109 08/15/2019   CO2 21 (L) 09/16/2019   CO2 23 08/16/2019   CO2 19 (L) 08/15/2019  GLUCOSE 136 (H) 09/16/2019   GLUCOSE 128 (H) 08/16/2019   GLUCOSE 128 (H) 08/15/2019   BUN 32 (H) 09/16/2019   BUN 39 (H) 08/16/2019   BUN 59 (H) 08/15/2019   CREATININE 1.31 (H) 09/16/2019   CREATININE 1.14 08/16/2019   CREATININE 1.28 (H) 08/15/2019   CALCIUM 8.7 (L) 09/16/2019   CALCIUM 9.0 08/16/2019   CALCIUM 8.9 08/15/2019   LFT Recent Labs    09/16/19 0559  PROT 5.4*  ALBUMIN 2.7*  AST 19  ALT 21  ALKPHOS 81  BILITOT 0.5  BILIDIR <0.1  IBILI NOT CALCULATED   PT/INR Lab Results  Component Value Date   INR 1.7 (H) 09/16/2019   INR 1.5 (H) 08/14/2019   INR 2.6 (H) 08/13/2019   Hepatitis Panel No results for input(s): HEPBSAG, HCVAB, HEPAIGM, HEPBIGM in the last 72 hours. C-Diff No components found for: CDIFF Lipase  No results found for: LIPASE  Drugs of Abuse  No results found for: LABOPIA, COCAINSCRNUR,  LABBENZ, AMPHETMU, THCU, LABBARB   RADIOLOGY STUDIES: DG Chest Portable 1 View  Result Date: 09/16/2019 CLINICAL DATA:  Shortness of breath. Weakness and dizziness. EXAM: PORTABLE CHEST 1 VIEW COMPARISON:  Radiograph 08/13/2019 FINDINGS: Post median sternotomy and CABG. Cardiomegaly. Stable mediastinal contours with aortic atherosclerosis. Increased interstitial opacity from prior exam. Possible minimal pleural effusions. No evidence of pneumonia. No pneumothorax. Chronic change about the right shoulder. IMPRESSION: Cardiomegaly post CABG. Increased interstitial opacity suspicious for pulmonary edema. Possible minimal pleural effusions. Aortic Atherosclerosis (ICD10-I70.0). Electronically Signed   By: Keith Rake M.D.   On: 09/16/2019 05:39      IMPRESSION:   *   Recurrent, presumed blood loss, transfusion requiring anemia.  Normal MCV.  Discharge on oral iron 1 month ago.   2nd of 2 PRBCs just started.    *    GI bleed 07/2019.  EGD 08/14/2019 with APC eradication of bleeding gastric AVM. Though CT scan showed esophageal varices, there were no varices or portal hypertension noted on the EGD. Protonix 40 mg/day at home but ran out 1 week ago, currently bid.   No overt GI bleeding.     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  *   Afib, chronic Xarelto, last dose was AM 2/18.  *    Coagulopathy.  INR improved compared with 1 month ago.  2.6 then, 1.7 now.  *     U/A suspicious for UTI.  No clx ordered, no abx.    *   AKI.  Recurrent, present during admission 1 month prior.  *     CXR suspicious for CHF. Elevated BNP  S/p CABG.  No echo in records including Care Everywhere.  Echo completed this AM, reading pndg.     *    S/p resection colon cancer in [place], 2003.  2 polyps of unclear type removed at colonoscopy 2010.   PLAN:     *    Repeat EGD tomorrow morning with Dr. [name].  *    CBC  early this evening and repeat in a.m.  Stool FOBT testing.    *    Keep office appointment with Dr. Hilarie Fredrickson set for 09/27/2019 at which point future cancer/polyp surveillance colonoscopy can be set up.  *    Ordered urine clx, consider empiric abx after spec collected.    *    Await reading of echocardiogram.  Azucena Freed  09/16/2019, 10:26 AM Phone [phone]    ________________________________________________________________________  Velora Heckler GI MD note:  I personally examined the patient, reviewed the data and agree with the assessment and plan described above.  He is representing after UGI bleed about a month ago at which time he was found to have an actively bleeding gastric AVM.  He's had no overt bleeding, came to hospital feeling very weak, fatigued and was found to have a Hb 5.3 (discharge 1 month ago was 8.8), macrocytic with normal platelets.  His cirrhosis is well compensated and no portal hypertesive signs on EGD last month.  He had colon cancer about 20 years ago, last colonoscopy was in 2009.  His father and two of his paternal uncles also had colon cancer.  He needs evaluation for recurrent anemia. Colonsocopy (personal and FH of colon cancer)  and EGD (recent bleeding gastric AVM)  scheduled for tomorrow with Dr. [name] Norway. Last xarelto was yesterday morning.  Owens Loffler, MD [facility] Pager [phone]

## 2019-09-16 NOTE — ED Triage Notes (Signed)
Pt denies any cp, fevers, n/v or coughing

## 2019-09-17 ENCOUNTER — Encounter (HOSPITAL_COMMUNITY)
Admission: EM | Disposition: A | Discharge status: Home / Self Care | Payer: Self-pay | Source: Home / Self Care | Attending: Internal Medicine

## 2019-09-17 ENCOUNTER — Encounter (HOSPITAL_COMMUNITY): Payer: Self-pay | Admitting: Internal Medicine

## 2019-09-17 ENCOUNTER — Inpatient Hospital Stay (HOSPITAL_COMMUNITY): Payer: Medicare Other | Admitting: Anesthesiology

## 2019-09-17 HISTORY — PX: COLONOSCOPY WITH PROPOFOL: SHX5780

## 2019-09-17 HISTORY — PX: POLYPECTOMY: SHX5525

## 2019-09-17 HISTORY — PX: ESOPHAGOGASTRODUODENOSCOPY (EGD) WITH PROPOFOL: SHX5813

## 2019-09-17 HISTORY — PX: GIVENS CAPSULE STUDY: SHX5432

## 2019-09-17 HISTORY — PX: HOT HEMOSTASIS: SHX5433

## 2019-09-17 LAB — TYPE AND SCREEN
ABO/RH(D): B POS
Antibody Screen: NEGATIVE
Unit division: 0
Unit division: 0
Unit division: 0

## 2019-09-17 LAB — CBC
HCT: 24.8 % — ABNORMAL LOW (ref 39.0–52.0)
HCT: 24.8 % — ABNORMAL LOW (ref 39.0–52.0)
Hemoglobin: 7.3 g/dL — ABNORMAL LOW (ref 13.0–17.0)
Hemoglobin: 7.5 g/dL — ABNORMAL LOW (ref 13.0–17.0)
MCH: 29 pg (ref 26.0–34.0)
MCH: 29.5 pg (ref 26.0–34.0)
MCHC: 29.4 g/dL — ABNORMAL LOW (ref 30.0–36.0)
MCHC: 30.2 g/dL (ref 30.0–36.0)
MCV: 98.4 fL (ref 80.0–100.0)
MCV: 97.6 fL (ref 80.0–100.0)
Platelets: 178 10*3/uL (ref 150–400)
Platelets: 159 10*3/uL (ref 150–400)
RBC: 2.52 MIL/uL — ABNORMAL LOW (ref 4.22–5.81)
RBC: 2.54 MIL/uL — ABNORMAL LOW (ref 4.22–5.81)
RDW: 17.7 % — ABNORMAL HIGH (ref 11.5–15.5)
RDW: 17.1 % — ABNORMAL HIGH (ref 11.5–15.5)
WBC: 5.8 10*3/uL (ref 4.0–10.5)
WBC: 5.2 10*3/uL (ref 4.0–10.5)
nRBC: 0.3 % — ABNORMAL HIGH (ref 0.0–0.2)
nRBC: 0.6 % — ABNORMAL HIGH (ref 0.0–0.2)

## 2019-09-17 LAB — COMPREHENSIVE METABOLIC PANEL
ALT: 17 U/L (ref 0–44)
AST: 24 U/L (ref 15–41)
Albumin: 2.6 g/dL — ABNORMAL LOW (ref 3.5–5.0)
Alkaline Phosphatase: 80 U/L (ref 38–126)
Anion gap: 10 (ref 5–15)
BUN: 20 mg/dL (ref 8–23)
CO2: 21 mmol/L — ABNORMAL LOW (ref 22–32)
Calcium: 8.7 mg/dL — ABNORMAL LOW (ref 8.9–10.3)
Chloride: 104 mmol/L (ref 98–111)
Creatinine, Ser: 1.09 mg/dL (ref 0.61–1.24)
GFR calc Af Amer: >60 mL/min (ref >60)
GFR calc non Af Amer: >60 mL/min (ref >60)
Glucose, Bld: 110 mg/dL — ABNORMAL HIGH (ref 70–99)
Potassium: 4.5 mmol/L (ref 3.5–5.1)
Sodium: 135 mmol/L (ref 135–145)
Total Bilirubin: 1 mg/dL (ref 0.3–1.2)
Total Protein: 5 g/dL — ABNORMAL LOW (ref 6.5–8.1)

## 2019-09-17 LAB — BPAM RBC
Blood Product Expiration Date: 202102242359
Blood Product Expiration Date: 202103132359
Blood Product Expiration Date: 202103132359
ISSUE DATE / TIME: 202102190932
ISSUE DATE / TIME: 202102191157
ISSUE DATE / TIME: 202102191958
Unit Type and Rh: 7300
Unit Type and Rh: 7300
Unit Type and Rh: 7300

## 2019-09-17 LAB — GLUCOSE, CAPILLARY
Glucose-Capillary: 94 mg/dL (ref 70–99)
Glucose-Capillary: 86 mg/dL (ref 70–99)
Glucose-Capillary: 91 mg/dL (ref 70–99)
Glucose-Capillary: 86 mg/dL (ref 70–99)

## 2019-09-17 SURGERY — ESOPHAGOGASTRODUODENOSCOPY (EGD) WITH PROPOFOL
Anesthesia: Monitor Anesthesia Care

## 2019-09-17 SURGERY — IMAGING PROCEDURE, GI TRACT, INTRALUMINAL, VIA CAPSULE
Anesthesia: LOCAL

## 2019-09-17 SURGERY — COLONOSCOPY WITH PROPOFOL
Anesthesia: Monitor Anesthesia Care

## 2019-09-17 MED ORDER — PROPOFOL 500 MG/50ML IV EMUL
INTRAVENOUS | Status: DC | PRN
  Administered 2019-09-17: 100 ug/kg/min via INTRAVENOUS

## 2019-09-17 MED ORDER — PHENYLEPHRINE 40 MCG/ML (10ML) SYRINGE FOR IV PUSH (FOR BLOOD PRESSURE SUPPORT)
PREFILLED_SYRINGE | INTRAVENOUS | Status: DC | PRN
  Administered 2019-09-17: 80 ug via INTRAVENOUS
  Administered 2019-09-17: 120 ug via INTRAVENOUS
  Administered 2019-09-17 (×4): 80 ug via INTRAVENOUS
  Administered 2019-09-17: 40 ug via INTRAVENOUS
  Administered 2019-09-17 (×2): 80 ug via INTRAVENOUS
  Administered 2019-09-17: 40 ug via INTRAVENOUS

## 2019-09-17 MED ORDER — PROPOFOL 10 MG/ML IV BOLUS
INTRAVENOUS | Status: DC | PRN
  Administered 2019-09-17: 20 mg via INTRAVENOUS
  Administered 2019-09-17 (×2): 10 mg via INTRAVENOUS

## 2019-09-17 MED ORDER — EPHEDRINE SULFATE 50 MG/ML IJ SOLN
INTRAMUSCULAR | Status: DC | PRN
  Administered 2019-09-17 (×5): 10 mg via INTRAVENOUS

## 2019-09-17 MED ORDER — LACTATED RINGERS IV SOLN: INTRAVENOUS | Status: DC | PRN

## 2019-09-17 MED ORDER — LACTATED RINGERS IV SOLN
INTRAVENOUS | Status: AC | PRN
  Administered 2019-09-17: 1000 mL via INTRAVENOUS

## 2019-09-17 MED ORDER — SODIUM CHLORIDE 0.9 % IV SOLN: INTRAVENOUS | Status: DC

## 2019-09-17 MED ORDER — ALBUMIN HUMAN 5 % IV SOLN: INTRAVENOUS | Status: DC | PRN

## 2019-09-17 SURGICAL SUPPLY — 24 items

## 2019-09-17 SURGICAL SUPPLY — 1 items: TOWEL COTTON PACK 4EA (MISCELLANEOUS) ×4 IMPLANT

## 2019-09-17 NOTE — Transfer of Care (Signed)
Immediate Anesthesia Transfer of Care Note  Patient: [name]  Procedure(s) Performed: ESOPHAGOGASTRODUODENOSCOPY (EGD) WITH PROPOFOL (N/A ) COLONOSCOPY WITH PROPOFOL (N/A ) HOT HEMOSTASIS (ARGON PLASMA COAGULATION/BICAP) (N/A ) POLYPECTOMY  Patient Location: Endoscopy Unit  Anesthesia Type:MAC  Level of Consciousness: awake, alert  and oriented  Airway & Oxygen Therapy: Patient Spontanous Breathing  Post-op Assessment: SBP at 80"s. Dr. Conrad [place] aware.   Post vital signs: Reviewed  Last Vitals:  Vitals Value Taken Time  BP 107/40 09/17/19 1112  Temp    Pulse 89 09/17/19 1114  Resp 12 09/17/19 1114  SpO2 97 % 09/17/19 1114  Vitals shown include unvalidated device data.  Last Pain:  Vitals:   09/17/19 0918  TempSrc: Oral  PainSc: 6          Complications: No apparent anesthesia complications

## 2019-09-17 NOTE — Progress Notes (Addendum)
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  Patient states that LE edema began around the same time he started taking Xarelto in Jan 2021.  Denies SOB, CP.  Objective:   Vital signs in last 24 hours: Vitals:   09/17/19 1125 09/17/19 1130 09/17/19 1135 09/17/19 1152  BP: (!) 124/50 (!) 113/52 113/61 106/75  Pulse: 77 84 84 80  Resp:  (!) 34  18  Temp:    97.9 F (36.6 C)  TempSrc:    Oral  SpO2: 98% 97% 98% 96%  Weight:       Weight change: -2.2 kg  Intake/Output Summary (Last 24 hours) at 09/17/2019 1409 Last data filed at 09/17/2019 1118 Gross per 24 hour  Intake 3356 ml  Output 2500 ml  Net 856 ml   Physical Exam: Gen:  disheveled appearing, obese gentleman resting in bed in NAD HEENT:  Normocephalic, atraumatic Cardio:  Normal rate, irregular rhythm, no murmurs, rubs or gallops Pulm: Normal breath sounds, in no respiratory distress Abd:  Obese, soft, non-distended abdomen Msk:  3+ lower ext edema bilaterally Skin:  Scattered, healing scabs on extremities, no signs of infection.  L foot with bandage on dorsal aspect.   Neuro:  Alert and oriented, CNs grossly intact,  Psych:  Normal mood with appropriate affect, talkative  Labs: CBC Latest Ref Rng & Units 09/17/2019 09/16/2019 09/16/2019  WBC 4.0 - 10.5 K/uL 5.8 7.3 6.6  Hemoglobin 13.0 - 17.0 g/dL 7.3(L) 7.0(L) 5.3(LL)  Hematocrit 39.0 - 52.0 % 24.8(L) 23.5(L) 19.9(L)  Platelets 150 - 400 K/uL 178 211 221    Ref Range & Units 1 d ago  Retic Ct Pct 0.4 - 3.1 % 7.8High    RBC. 4.22 - 5.81 MIL/uL 1.70Low    Retic Count, Absolute 19.0 - 186.0 K/uL 133.3   Immature Retic Fract 2.3 - 15.9 % 42.6High      CMP Latest Ref Rng & Units 09/17/2019 09/16/2019 08/16/2019  Glucose 70 - 99 mg/dL 110(H) 136(H) 128(H)  BUN 8 - 23 mg/dL 20 32(H) 39(H)  Creatinine 0.61  - 1.24 mg/dL 1.09 1.31(H) 1.14  Sodium 135 - 145 mmol/L 135 140 140  Potassium 3.5 - 5.1 mmol/L 4.5 4.7 4.0  Chloride 98 - 111 mmol/L 104 109 109  CO2 22 - 32 mmol/L 21(L) 21(L) 23  Calcium 8.9 - 10.3 mg/dL 8.7(L) 8.7(L) 9.0  Total Protein 6.5 - 8.1 g/dL 5.0(L) 5.4(L) -  Total Bilirubin 0.3 - 1.2 mg/dL 1.0 0.5 -  Alkaline Phos 38 - 126 U/L 80 81 -  AST 15 - 41 U/L 24 19 -  ALT 0 - 44 U/L 17 21 -   Mg 1.9  UA:  Ref Range & Units 1 d ago  (09/16/19)  Color, Urine YELLOW YELLOW   APPearance CLEAR CLOUDYAbnormal    Specific Gravity, Urine 1.005 - 1.030 1.011   pH 5.0 - 8.0 5.0   Glucose, UA NEGATIVE mg/dL NEGATIVE   Hgb urine dipstick NEGATIVE MODERATEAbnormal    Bilirubin Urine NEGATIVE NEGATIVE   Ketones, ur NEGATIVE mg/dL NEGATIVE   Protein, ur NEGATIVE mg/dL NEGATIVE   Nitrite NEGATIVE NEGATIVE   Leukocytes,Ua NEGATIVE LARGEAbnormal    RBC / HPF 0 - 5 RBC/hpf >50High    WBC, UA 0 - 5 WBC/hpf >50High  Bacteria, UA NONE SEEN MANYAbnormal    WBC Clumps  PRESENT   Squamous Epithelial / LPF          Ref Range & Units 1 d ago 1 mo ago  aPTT 24 - 36 seconds 32  35 CM     1 d ago  (09/16/19) 1 mo ago  (08/14/19) 1 mo ago  (08/13/19)   Prothrombin Time 11.4 - 15.2 seconds 20.2High   17.8High   27.9High    INR 0.8 - 1.2 1.7High   1.5High  CM  2.6High  CM     Ref Range & Units 1 d ago  B Natriuretic Peptide 0.0 - 100.0 pg/mL 274.9High     Imaging and Studies:  CXR 2/19 IMPRESSION: Cardiomegaly post CABG. Increased interstitial opacity suspicious for pulmonary edema. Possible minimal pleural effusions.  Echo 2/19 IMPRESSIONS  1. Left ventricular ejection fraction, by estimation, is 55 to 60%. The left ventricle has normal function. The left ventricle has no regional wall motion abnormalities. There is mild asymmetric left ventricular hypertrophy of the basal-septal segment. Left ventricular diastolic function could not be evaluated.  2. Right ventricular systolic  function is normal. The right ventricular size is normal. Tricuspid regurgitation signal is inadequate for assessing PA pressure.  3. The mitral valve is grossly normal. Trivial mitral valve regurgitation.  4. The aortic valve is tricuspid. Aortic valve regurgitation is mild. Mild aortic valve stenosis. Aortic valve area, by VTI measures 1.96 cm.  Aortic valve mean gradient measures 15.0 mmHg. Aortic valve Vmax measures 2.68 m/s.  5. The inferior vena cava is normal in size with greater than 50% respiratory variability, suggesting right atrial pressure of 3 mmHg.   Assessment/Plan:  Principal Problem:   Symptomatic anemia Active Problems:   History of colon cancer   CAD (coronary artery disease)   DM (diabetes mellitus), type 2 ([place])   Atrial fibrillation (HCC)  Brandon [name] is a 74 yo M w/ an extensive PMHx significant for conditions such as colon cancer s/p resection and chemo in 2002, CABG ~1999, DMII, HTN, Atrial fibrillation on Xarelto since 04/2019, and a GI bleed in January of 2021 who presented for weakness and dyspnea. His symptoms appear to be predominantly 2/2 to anemia from likely recurrent GI loses in the setting of Xarelto. He was admitted for symptomatic anemia treatment and evaluation.   Symptomatic anemia: Hx of Colon cancer s/p resection and chemotx in 2002: Cirrhosis: GI was consulted to consider repeat upper GI, planned for today 2/20. 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 Continued use of Xarelto has predisposed him to this. HAS-BLED score 4-- recommend looking for alternatives to anticoagulation.    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 Additionally CT scan during Jan 2021 admission showed esophageal varices, but  there were no varices or portal hypertension noted on the EGD.  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  On admission, transfused two units of PRBC's due to ongoing bleed with Hgb of 5.3 as we will expect only an ~1g increase in Hgb per unit with a goal of 7 -- goal met (Hgb 7.3 on 2/20).   Elevated retic count consisted with bleed.  On 2/20, MELD score 15 with 6% estimated 35-monthmortality. Plan: -Appreciate GI's recommendations -EGD  and colonoscopy today -Pantoprazole '40mg'$  BID PO -Hold Xarelto in the setting of recurrent GI bleed -HAS-BLED score 4-- recommend looking for alternatives to anticoagulation -Type and screen completed -Daily CBC -Consider spironolactone 50 mg daily (maintenance for cirrhosis) -Consider outpatient MRI for further eval of liver mass, favoring hemangioma  CAD: Hx of CABG: Pedal edema: Pedal edema likely due to cirrhosis and HFpEF, given pedal edema, pulmonary edema on CXR and A-fib. Echo with normal EF (diastolic dysfunction unable to be evaluated). BNP elevated to 245 but may be lower than expected for degree of CHF due to patients BMI.  We held his antihypertensives and lasix on admission due to his anemia and soft BP.   Plan: -Hold Lasix 20 mg PO. -09/05/19-- Patient's cardiologist discontinued amlodipine and started diltiazem d/t LE swelling  DMII: We will hold his Metformin while admitted for his well controlled DMII. Plan: -SSI sensitive  Atrial fibrillation: First noted last year (Oct 2020), subsequently started on Xarelto. The patient has both an elevated stroke risk as well as a bleeding risk. We will need to discuss the risks vs benefits of continuing the anticoagulation following evaluation by GI.  Plan: -Continue Amiodarone '200mg'$  daily -Holding diltiazem 24hr tablet, 180 mg daily -Hold xarelto  -Appreciate GI's recs -Tele admit    HTN:  Soft BP on admission, so held lisinopril and diltiazem.  Today, BP elevated to 148/90. Holding home  diltiazem. -  Holding lisinopril 40 mg daily and diltiazem  Gout: Continue home allopurinol to prevent recurrence during acute illness.   BPH: Continue home tamsulosin and finasteride.     LOS: 1 day   Brandon [name], Medical Student 09/17/2019, 2:09 PM   Attestation for Student Documentation:  I personally was present and performed or re-performed the history, physical exam and medical decision-making activities of this service and have verified that the service and findings are accurately documented in the student's note.  Lars Mage, MD

## 2019-09-17 NOTE — Op Note (Signed)
[facility] Patient Name: Brandon [name] Procedure Date : 09/17/2019 MRN: [id number] Attending MD: [name] , MD Date of Birth: [birth year] CSN: [id number] Age: 74 Admit Type: Inpatient Procedure:                Upper GI endoscopy Indications:              Iron deficiency anemia, Heme positive stool Providers:                [name], MD, Glori Bickers, RN, Marguerita Merles, Technician Referring MD:              Medicines:                Propofol per Anesthesia Complications:            No immediate complications. Estimated Blood Loss:     Estimated blood loss: none. Procedure:                Pre-Anesthesia Assessment:                           - Prior to the procedure, a History and Physical                            was performed, and patient medications and                            allergies were reviewed. The patient's tolerance of                            previous anesthesia was also reviewed. The risks                            and benefits of the procedure and the sedation                            options and risks were discussed with the patient.                            All questions were answered, and informed consent                            was obtained. Prior Anticoagulants: The patient has                            taken no previous anticoagulant or antiplatelet                            agents. ASA Grade Assessment: III - A patient with                            severe systemic disease. After reviewing the risks  and benefits, the patient was deemed in                            satisfactory condition to undergo the procedure.                           - Sedation was administered by an anesthesia                            professional. Deep sedation was attained.                           After obtaining informed consent, the endoscope was                            passed under  direct vision. Throughout the                            procedure, the patient's blood pressure, pulse, and                            oxygen saturations were monitored continuously. The                            [id number] Olympus gastroscope was                            introduced through the mouth, and advanced to the                            third part of duodenum. The upper GI endoscopy was                            accomplished without difficulty. The patient                            tolerated the procedure well. Scope In: Scope Out: Findings:      The esophagus was normal.      A single small angiodysplastic lesion with no bleeding was found on the       greater curvature of the stomach. Coagulation for tissue destruction       using monopolar probe was successful. Estimated blood loss: none.      The examined duodenum was normal. Impression:               - Normal esophagus.                           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 Treated with a monopolar probe.                           - Normal examined duodenum.                           -  No specimens collected. Recommendation:           - Proceed with the colonoscopy. Procedure Code(s):        --- Professional ---                           [phone], Esophagogastroduodenoscopy, flexible,                            transoral; with ablation of tumor(s), polyp(s), or                            other lesion(s) (includes pre- and post-dilation                            and guide wire passage, when performed) Diagnosis Code(s):        --- Professional ---                           [id number], Angiodysplasia of stomach and duodenum                            without bleeding                           D50.9, Iron deficiency anemia, unspecified                           R19.5, Other fecal abnormalities CPT copyright 2019 American Medical Association. All rights  reserved. The codes documented in this report are preliminary and upon coder review may  be revised to meet current compliance requirements. [name], MD [name], MD 09/17/2019 11:14:02 AM This report has been signed electronically. Number of Addenda: 0

## 2019-09-17 NOTE — Anesthesia Preprocedure Evaluation (Signed)
Anesthesia Evaluation  Patient identified by MRN, date of birth, ID band Patient awake    Reviewed: Allergy & Precautions, NPO status , Patient's Chart, lab work & pertinent test results  Airway Mallampati: II  TM Distance: >3 FB Neck ROM: Full    Dental   Pulmonary former smoker,    Pulmonary exam normal        Cardiovascular + CAD and + CABG  Normal cardiovascular exam+ dysrhythmias Atrial Fibrillation      Neuro/Psych    GI/Hepatic   Endo/Other  diabetes, Type 2  Renal/GU      Musculoskeletal   Abdominal   Peds  Hematology   Anesthesia Other Findings   Reproductive/Obstetrics                             Anesthesia Physical Anesthesia Plan  ASA: III  Anesthesia Plan: MAC   Post-op Pain Management:    Induction: Intravenous  PONV Risk Score and Plan: 1 and Treatment may vary due to age or medical condition  Airway Management Planned: Nasal Cannula  Additional Equipment:   Intra-op Plan:   Post-operative Plan:   Informed Consent: I have reviewed the patients History and Physical, chart, labs and discussed the procedure including the risks, benefits and alternatives for the proposed anesthesia with the patient or authorized representative who has indicated his/her understanding and acceptance.       Plan Discussed with: CRNA and Surgeon  Anesthesia Plan Comments:         Anesthesia Quick Evaluation

## 2019-09-17 NOTE — Anesthesia Postprocedure Evaluation (Signed)
Anesthesia [name] Note  Patient: Brandon [name]  Procedure(s) Performed: ESOPHAGOGASTRODUODENOSCOPY (EGD) WITH PROPOFOL (N/A ) COLONOSCOPY WITH PROPOFOL (N/A ) HOT HEMOSTASIS (ARGON PLASMA COAGULATION/BICAP) (N/A ) POLYPECTOMY     Patient location during evaluation: PACU Anesthesia Type: MAC Level of consciousness: awake and alert Pain management: pain level controlled Vital Signs Assessment: [name]-procedure vital signs reviewed and stable Respiratory status: spontaneous breathing, nonlabored ventilation, respiratory function stable and patient connected to nasal cannula oxygen Cardiovascular status: stable and blood pressure returned to baseline Postop Assessment: no apparent nausea or vomiting Anesthetic complications: no    Last Vitals:  Vitals:   09/17/19 1135 09/17/19 1152  BP: 113/61 106/75  Pulse: 84 80  Resp:  18  Temp:  36.6 C  SpO2: 98% 96%    Last Pain:  Vitals:   09/17/19 1152  TempSrc: Oral  PainSc:                  Jed Kutch DAVID

## 2019-09-17 NOTE — Progress Notes (Signed)
Dr. Conrad Seaford by to see pt. He states pt is good to go back up ro room

## 2019-09-17 NOTE — Progress Notes (Signed)
Dr. Conrad Plains to bedside. Saw pt vital sign. Wants to put a hold on albumin for now. Just have LR at [facility]. Continue monitor bp

## 2019-09-17 NOTE — Progress Notes (Signed)
bp remaining stable

## 2019-09-17 NOTE — Op Note (Signed)
[facility] Patient Name: Brandon [name] Procedure Date : 09/17/2019 MRN: [id number] Attending MD: [name] , MD Date of Birth: Apr 28, 1946 CSN: [id number] Age: 74 Admit Type: Inpatient Procedure:                Colonoscopy Indications:              Heme positive stool, Iron deficiency anemia Providers:                [name], MD, Glori Bickers, RN Referring MD:              Medicines:                Propofol per Anesthesia Complications:            No immediate complications. Estimated Blood Loss:     Estimated blood loss was minimal. Procedure:                Pre-Anesthesia Assessment:                           - Prior to the procedure, a History and Physical                            was performed, and patient medications and                            allergies were reviewed. The patient's tolerance of                            previous anesthesia was also reviewed. The risks                            and benefits of the procedure and the sedation                            options and risks were discussed with the patient.                            All questions were answered, and informed consent                            was obtained. Prior Anticoagulants: The patient has                            taken Xarelto (rivaroxaban), last dose was 1 day                            prior to procedure. ASA Grade Assessment: III - A                            patient with severe systemic disease. After                            reviewing the risks and benefits, the patient was  deemed in satisfactory condition to undergo the                            procedure.                           - Sedation was administered by an anesthesia                            professional. Deep sedation was attained.                           After obtaining informed consent, the colonoscope                            was passed under direct vision.  Throughout the                            procedure, the patient's blood pressure, pulse, and                            oxygen saturations were monitored continuously. The                            [id number] Olympus colonoscope was                            introduced through the anus and advanced to the the                            terminal ileum. The colonoscopy was performed                            without difficulty. The patient tolerated the                            procedure well. The quality of the bowel                            preparation was good. The terminal ileum was                            photographed. Scope In: 10:32:01 AM Scope Out: 10:57:18 AM Scope Withdrawal Time: 0 hours 15 minutes 51 seconds  Total Procedure Duration: 0 hours 25 minutes 17 seconds  Findings:      Two sessile polyps were found in the transverse colon and ascending       colon. The polyps were 3 to 4 mm in size. These polyps were removed with       a cold snare. Resection and retrieval were complete.      Scattered small and large-mouthed diverticula were found in the sigmoid       colon and descending colon.      There was evidence of a prior functional end-to-end ileo-colonic       anastomosis in the ascending colon. This was patent and was  characterized by healthy appearing mucosa. The anastomosis was traversed.      The colon was redundant and the anastomosis was difficult to find. The       area was obscured as the lumen was folded over on itself. Impression:               - Two 3 to 4 mm polyps in the transverse colon and                            in the ascending colon, removed with a cold snare.                            Resected and retrieved.                           - Diverticulosis in the sigmoid colon and in the                            descending colon.                           - Patent functional end-to-end ileo-colonic                             anastomosis, characterized by healthy appearing                            mucosa. Recommendation:           - Return patient to hospital ward for ongoing care.                           - NPO.                           - Continue present medications.                           - VCE. Procedure Code(s):        --- Professional ---                           [phone], Colonoscopy, flexible; with removal of                            tumor(s), polyp(s), or other lesion(s) by snare                            technique Diagnosis Code(s):        --- Professional ---                           K63.5, Polyp of colon                           Z98.0, Intestinal bypass and anastomosis status  R19.5, Other fecal abnormalities                           D50.9, Iron deficiency anemia, unspecified                           K57.30, Diverticulosis of large intestine without                            perforation or abscess without bleeding CPT copyright 2019 American Medical Association. All rights reserved. The codes documented in this report are preliminary and upon coder review may  be revised to meet current compliance requirements. [name], MD [name], MD 09/17/2019 11:18:54 AM This report has been signed electronically. Number of Addenda: 0

## 2019-09-18 DIAGNOSIS — I10 Essential (primary) hypertension: Secondary | ICD-10-CM

## 2019-09-18 DIAGNOSIS — D1800 Hemangioma unspecified site: Secondary | ICD-10-CM

## 2019-09-18 DIAGNOSIS — I2510 Atherosclerotic heart disease of native coronary artery without angina pectoris: Secondary | ICD-10-CM

## 2019-09-18 LAB — CBC
HCT: 24.2 % — ABNORMAL LOW (ref 39.0–52.0)
HCT: 26.6 % — ABNORMAL LOW (ref 39.0–52.0)
Hemoglobin: 7.2 g/dL — ABNORMAL LOW (ref 13.0–17.0)
Hemoglobin: 8.1 g/dL — ABNORMAL LOW (ref 13.0–17.0)
MCH: 29.3 pg (ref 26.0–34.0)
MCH: 29.3 pg (ref 26.0–34.0)
MCHC: 29.8 g/dL — ABNORMAL LOW (ref 30.0–36.0)
MCHC: 30.5 g/dL (ref 30.0–36.0)
MCV: 98.4 fL (ref 80.0–100.0)
MCV: 96.4 fL (ref 80.0–100.0)
Platelets: 172 10*3/uL (ref 150–400)
Platelets: 176 10*3/uL (ref 150–400)
RBC: 2.46 MIL/uL — ABNORMAL LOW (ref 4.22–5.81)
RBC: 2.76 MIL/uL — ABNORMAL LOW (ref 4.22–5.81)
RDW: 17 % — ABNORMAL HIGH (ref 11.5–15.5)
RDW: 16.4 % — ABNORMAL HIGH (ref 11.5–15.5)
WBC: 5.4 10*3/uL (ref 4.0–10.5)
WBC: 5.7 10*3/uL (ref 4.0–10.5)
nRBC: 0.6 % — ABNORMAL HIGH (ref 0.0–0.2)
nRBC: 0 % (ref 0.0–0.2)

## 2019-09-18 LAB — GLUCOSE, CAPILLARY
Glucose-Capillary: 97 mg/dL (ref 70–99)
Glucose-Capillary: 103 mg/dL — ABNORMAL HIGH (ref 70–99)
Glucose-Capillary: 117 mg/dL — ABNORMAL HIGH (ref 70–99)

## 2019-09-18 MED ORDER — FUROSEMIDE 20 MG PO TABS
20.0000 mg | ORAL_TABLET | Freq: Every day | ORAL | Status: DC
  Administered 2019-09-18 – 2019-09-19 (×2): 20 mg via ORAL
  Filled 2019-09-18 (×2): qty 1

## 2019-09-18 MED ORDER — RIVAROXABAN 20 MG PO TABS
20.0000 mg | ORAL_TABLET | Freq: Every day | ORAL | Status: DC
  Administered 2019-09-18 – 2019-09-19 (×2): 20 mg via ORAL
  Filled 2019-09-18 (×2): qty 1

## 2019-09-18 NOTE — Progress Notes (Signed)
   Subjective:  [name] was seen and evaluate at bedside this AM. Patient states that he did not sleep well last night, but this attributes this to his restless nature. He states that he was able to eat his dinner and breakfast this morning without having nausea. He denies feeling light headed or vomiting. He has not had a BM, but does pass gas. All questions and concerns were addressed.   Objective:  Vital signs in last 24 hours: Vitals:   09/17/19 1152 09/17/19 1804 09/17/19 2003 09/18/19 0507  BP: 106/75 112/63 105/64 102/64  Pulse: 80 86 89 75  Resp: 18 20  18   Temp: 97.9 F (36.6 C) 98.2 F (36.8 C) 98.5 F (36.9 C) 98.1 F (36.7 C)  TempSrc: Oral Oral Oral Oral  SpO2: 96% 96% 94% 96%  Weight:       Physical Exam  Constitutional: He is oriented to person, place, and time. He appears well-developed and well-nourished. No distress.  HENT:  Head: Normocephalic and atraumatic.  Cardiovascular: Normal rate, regular rhythm and normal heart sounds.  Respiratory: Effort normal and breath sounds normal. No respiratory distress. He has no wheezes.  GI: Soft. Bowel sounds are normal. He exhibits no distension. There is no abdominal tenderness.  Neurological: He is alert and oriented to person, place, and time.  Skin: He is not diaphoretic.   Assessment/Plan:  Principal Problem:   Symptomatic anemia Active Problems:   History of colon cancer   CAD (coronary artery disease)   DM (diabetes mellitus), type 2 (HCC)   Atrial fibrillation (HCC)  Brandon [name] is a 89 y,o m with dm2, cad s/p cabg, afib on xarelto, aud, hx of colon cancer s/p resection and resection, cirrhosis presented with symptomatic anemia.   Symptomatic anemia Patient with stable hb 7.2. Had recent admission 1/16-1/19 during which hematin found in stomach on egd with bleeding avm in greater curvature of stomach s/p apc. Patient's EGD 2/20 showed a non-bleeding angiodysplastic lesion in stomach tx with monopolar  probe. Colonoscopy showed two 3-59mm polyps in transverse coon and ascending colon that were removed.   Due to unrevealing egd and colonoscopy video capsue endoscopy was done. Will wait for the read and GI recommendations.   -continue pantoprazole 40mg  bid  -continue cbc monitoring hb bid   Atrial fibrillation    Rates ranging 60-80s. Hemodynamically stable.   -continue amiodarone 200mg  qd  -hold xarelto  Compensated cirrhosis  Thought to be secondary to [facility] and alcohol use.   -continue po lasix 20mg  qd  -restart spironolactone 2/22 after checking potassium   CAD s/p CABG Holding diltiazem 180mg  qd due to soft bps.   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 F/u outpt mri abd HTN: holding meds due to low pressures  DM: SSI Gout: continue allopurinol BPH: continue tamsulosin, finasteride    Dispo: Anticipated discharge in approximately 2-3 day(s).   Brandon Mage, MD 09/18/2019, 6:55 AM

## 2019-09-19 ENCOUNTER — Encounter (HOSPITAL_COMMUNITY): Payer: Self-pay | Admitting: Internal Medicine

## 2019-09-19 DIAGNOSIS — D500 Iron deficiency anemia secondary to blood loss (chronic): Secondary | ICD-10-CM

## 2019-09-19 LAB — CBC
HCT: 26.9 % — ABNORMAL LOW (ref 39.0–52.0)
Hemoglobin: 8 g/dL — ABNORMAL LOW (ref 13.0–17.0)
MCH: 29.1 pg (ref 26.0–34.0)
MCHC: 29.7 g/dL — ABNORMAL LOW (ref 30.0–36.0)
MCV: 97.8 fL (ref 80.0–100.0)
Platelets: 175 10*3/uL (ref 150–400)
RBC: 2.75 MIL/uL — ABNORMAL LOW (ref 4.22–5.81)
RDW: 16.1 % — ABNORMAL HIGH (ref 11.5–15.5)
WBC: 5.3 10*3/uL (ref 4.0–10.5)
nRBC: 0 % (ref 0.0–0.2)

## 2019-09-19 LAB — BASIC METABOLIC PANEL
Anion gap: 9 (ref 5–15)
BUN: 15 mg/dL (ref 8–23)
CO2: 25 mmol/L (ref 22–32)
Calcium: 8.9 mg/dL (ref 8.9–10.3)
Chloride: 101 mmol/L (ref 98–111)
Creatinine, Ser: 1.12 mg/dL (ref 0.61–1.24)
GFR calc Af Amer: >60 mL/min (ref >60)
GFR calc non Af Amer: >60 mL/min (ref >60)
Glucose, Bld: 122 mg/dL — ABNORMAL HIGH (ref 70–99)
Potassium: 4 mmol/L (ref 3.5–5.1)
Sodium: 135 mmol/L (ref 135–145)

## 2019-09-19 LAB — GLUCOSE, CAPILLARY
Glucose-Capillary: 113 mg/dL — ABNORMAL HIGH (ref 70–99)
Glucose-Capillary: 117 mg/dL — ABNORMAL HIGH (ref 70–99)

## 2019-09-19 MED ORDER — SPIRONOLACTONE 50 MG PO TABS: 50.0000 mg | ORAL_TABLET | Freq: Every day | ORAL | 0 refills | Status: DC

## 2019-09-19 MED ORDER — FERROUS SULFATE 325 (65 FE) MG PO TABS: 325.0000 mg | ORAL_TABLET | Freq: Every day | ORAL | Status: DC

## 2019-09-19 MED FILL — SPIRONOLACTONE 50 MG TAB: 50 | 30 days supply | Qty: 30 | Fill #0

## 2019-09-19 NOTE — Progress Notes (Addendum)
Daily Rounding Note  09/19/2019, 8:31 AM  LOS: 3 days    ASSESMENT:   *   Recurrent presumed GI blood loss anemia. No melena or hematochezia etc Hgb 5.3  >> 3 PRBCs >> 8.   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 09/17/2019 EGD: APC eradication of solitary, nonbleeding gastric AVM 09/17/2019 colonoscopy: 2 small colon polyps removed with cold snare.  Sigmoid diverticulosis.  Patent, of the appearing end-to-end ileocolonic anastomosis 09/17/19 VCE: heme in the stomach and normal small bowel into the colon.  *   Cirrhosis of the liver, due to EtOH and likely due to morbid obesity as well.  *    A. fib, chronic Xarelto.  Had been on hold, restarted p.m. 09/18/2019.    PLAN   *   Keep 09/27/19 appt w Dr Hilarie Fredrickson.  From GI view, patient okay for discharge. Protonix 40 mg bid for 10 d then 1 x daily.   Agree with plan for more frequent outpatient Hb/hematocrit assays.  Azucena Freed  09/19/2019, 8:31 AM Phone [place] Attending   I have taken an interval history, reviewed the chart and examined the patient. I agree with the Advanced Practitioner's note, impression and recommendations.    He needs a daily ferrous sulfate tablet also I ordered   We are signed off  Gatha Mayer, MD, [facility] 09/19/2019 10:17 AM     SUBJECTIVE:   Chief complaint: Blood loss anemia.    Brown stool at 2 AM this morning.  Patient feels well. He started Eliquis last night.  OBJECTIVE:         Vital signs in last 24 hours:    Temp:  [98.2 F (36.8 C)-98.5 F (36.9 C)] 98.2 F (36.8 C) (02/21 2123) Pulse Rate:  [69-88] 88 (02/21 2123) Resp:  [18] 18 (02/21 2123) BP: (107-142)/(57-98) 142/98 (02/21 2123) SpO2:  [97 %-100 %] 100 % (02/21 2123) Last BM Date: 09/16/19 Filed Weights   09/16/19 0445 09/16/19 2325 09/17/19 0918  Weight: (!) 147 kg (!) 144.8 kg (!) 144.8 kg    General: Morbidly obese, comfortable. Heart: RRR. Chest: Diminished breath sounds.  No labored breathing.  No cough. Abdomen: Large, soft, nontender.  Some scabbed lesions but no large bruises. Extremities: Lower extremity edema. Neuro/Psych: Alert.  Oriented x3.  Appropriate.  Fluid speech.  Intake/Output from previous day: 02/21 0701 - 02/22 0700 In: 800 [P.O.:800] Out: 4100 [Urine:4100]   Lab Results: Recent Labs    09/18/19 0404 09/18/19 1844 09/19/19 0711  WBC 5.4 5.7 5.3  HGB 7.2* 8.1* 8.0*  HCT 24.2* 26.6* 26.9*  PLT 172 176 175   BMET Recent Labs    09/17/19 0328 09/19/19 0410  NA 135 135  K 4.5 4.0  CL 104 101  CO2 21* 25  GLUCOSE 110* 122*  BUN 20 15  CREATININE 1.09 1.12  CALCIUM 8.7* 8.9   LFT Recent Labs    09/17/19 0328  PROT 5.0*  ALBUMIN 2.6*  AST 24  ALT 17  ALKPHOS 80  BILITOT 1.0    Studies/Results: No results found.   Scheduled Meds: . sodium chloride   Intravenous Once  . allopurinol  300 mg Oral QHS  . amiodarone  200 mg Oral Daily  . finasteride  5 mg Oral Daily  . folic acid  1 mg Oral Daily  . furosemide  20 mg Oral Daily  .  gatifloxacin  1 drop Right Eye QID  . insulin aspart  0-6 Units Subcutaneous TID WC  . ketorolac  1 drop Right Eye QID  . multivitamin with minerals  1 tablet Oral Daily  . pantoprazole  40 mg Oral BID  . polyethylene glycol  17 g Oral BID  . polyvinyl alcohol  1 drop Left Eye TID  . pravastatin  10 mg Oral QHS  . rivaroxaban  20 mg Oral Daily  . senna-docusate  1 tablet Oral Daily  . tamsulosin  0.4 mg Oral QHS  . thiamine  100 mg Oral Daily   Continuous Infusions: PRN Meds:.

## 2019-09-19 NOTE — Progress Notes (Signed)
MD called regarding CBC results. This RN called lab and spoke with tech who stated order was in for 0800 this am. RN changed time to now.   Eleanora Neighbor, RN

## 2019-09-19 NOTE — Progress Notes (Signed)
Brandon [name] to be discharged Home per MD order. Discussed prescriptions and follow up appointments with the patient. Prescriptions given to patient; medication list explained in detail. Patient verbalized understanding.  Skin clean, dry and intact without evidence of skin break down, no evidence of skin tears noted. IV catheter discontinued intact. Site without signs and symptoms of complications. Dressing and pressure applied. Pt denies pain at the site currently. No complaints noted.  Patient free of lines, drains, and wounds.   An After Visit Summary (AVS) was printed and given to the patient. Patient escorted via wheelchair, and discharged home via private auto.  Shela Commons, RN

## 2019-09-19 NOTE — Progress Notes (Signed)
Subjective:  Patient feels well today.  He understands the benefits and risks of restarting anticoagulation and is amenable to close follow up to check his H/H.  Patient had BM this morning -- no evidence of blood in stool.  Objective:  Vital signs in last 24 hours: Vitals:   09/18/19 0507 09/18/19 0941 09/18/19 1759 09/18/19 2123  BP: 102/64 109/75 (!) 107/57 (!) 142/98  Pulse: 75 69 76 88  Resp: 18 18 18 18   Temp: 98.1 F (36.7 C) 98.3 F (36.8 C) 98.5 F (36.9 C) 98.2 F (36.8 C)  TempSrc: Oral Oral Oral Oral  SpO2: 96% 98% 97% 100%  Weight:       Physical Exam  Constitutional: He is oriented to person, place, and time. He appears well-developed and well-nourished. No distress.  HENT:  Head: Normocephalic and atraumatic.  Cardiovascular: Normal rate, regular rhythm and normal heart sounds.  Respiratory: Effort normal and breath sounds normal. No respiratory distress. He has no wheezes.  GI: Soft. Bowel sounds are normal. He exhibits no distension. There is no abdominal tenderness.  Neurological: He is alert and oriented to person, place, and time.  Skin: He is not diaphoretic.   Labs: CBC Latest Ref Rng & Units 09/18/2019 09/18/2019 09/17/2019  WBC 4.0 - 10.5 K/uL 5.7 5.4 5.2  Hemoglobin 13.0 - 17.0 g/dL 8.1(L) 7.2(L) 7.5(L)  Hematocrit 39.0 - 52.0 % 26.6(L) 24.2(L) 24.8(L)  Platelets 150 - 400 K/uL 176 172 159   CMP Latest Ref Rng & Units 09/19/2019 09/17/2019 09/16/2019  Glucose 70 - 99 mg/dL 122(H) 110(H) 136(H)  BUN 8 - 23 mg/dL 15 20 32(H)  Creatinine 0.61 - 1.24 mg/dL 1.12 1.09 1.31(H)  Sodium 135 - 145 mmol/L 135 135 140  Potassium 3.5 - 5.1 mmol/L 4.0 4.5 4.7  Chloride 98 - 111 mmol/L 101 104 109  CO2 22 - 32 mmol/L 25 21(L) 21(L)  Calcium 8.9 - 10.3 mg/dL 8.9 8.7(L) 8.7(L)  Total Protein 6.5 - 8.1 g/dL - 5.0(L) 5.4(L)  Total Bilirubin 0.3 - 1.2 mg/dL - 1.0 0.5  Alkaline Phos 38 - 126 U/L - 80 81  AST 15 - 41 U/L - 24 19  ALT 0 - 44 U/L - 17 21     Assessment/Plan:  Principal Problem:   Symptomatic anemia Active Problems:   History of colon cancer   CAD (coronary artery disease)   DM (diabetes mellitus), type 2 (HCC)   Atrial fibrillation [facility])  Brandon [name] is a 46 y,o m with dm2, cad s/p cabg, afib on xarelto, aud, hx of colon cancer s/p resection and resection, cirrhosis presented with symptomatic anemia.   Symptomatic anemia Patient with stable Hgb 8.0. Had recent admission 1/16-1/19 during which hematin found in stomach on EGD with bleeding AVM in greater curvature of stomach s/p APC. Patient's EGD 2/20 showed a non-bleeding angiodysplastic lesion in stomach tx with monopolar probe. Colonoscopy showed two 3-40mm polyps in transverse colon and ascending colon that were removed. Due to unrevealing EGD and colonoscopy, video capsule endoscopy was done which showed heme in the stomach and normal small bowel into the colon.  No active bleeding was found during these studies.  Restarted Xarelto and will discharge today with instructions to follow up closely with PCP to monitor H/H.    - Appreciated GI recs:  Keep 09/27/19 appt w Brandon [name].  From GI view, patient okay for discharge.  Protonix 40 mg bid for 10 d then 1 x daily.    Agree  with plan for more frequent outpatient Hb/hematocrit assays  Atrial fibrillation    Rates ranging 60-80s. Hemodynamically stable.   -continue amiodarone 200mg  qd  -Continue xarelto -F/u with cardiology in outpatient setting (Brandon [name] with [facility]) --consider alternative to anticoagulation given bleeding risk (e.g., ablation, antiarrythmic, Watchman device)  Compensated cirrhosis  Thought to be secondary to [facility] and alcohol use.   -continue po lasix 20mg  qd  -restart spironolactone given potassium wnl  CAD s/p CABG Restart diltiazem 180mg  qd as BP has increased.  Initially held d/t soft Bps.  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 F/u outpt mri abd HTN:   One elevated reading of 142/98 this  morning.  Will continue to hold lisinopril on discharge d/t soft Bps during hospitalization   DM: SSI.  Restart home metformin on discharge.  Gout: continue allopurinol BPH: continue tamsulosin, finasteride    Dispo: Anticipated discharge in approximately 2-3 day(s).   Brandon [name], Medical Student 09/19/2019, 6:22 AM

## 2019-09-19 NOTE — Discharge Instructions (Signed)
It was a pleasure taking care of you, Brandon [name].  You were hospitalized for a gastrointestinal bleed, which caused your blood count to be low.  When you arrived, we transfused two units of red blood cells and held Xarelto, your anticoagulation medication.  The GI doctors completed an upper and lower GI scope, as well as a capsule study, and there was no active bleed present.  We restarted Xarelto at discharge to prevent a stroke from occurring.  Please follow up with your cardiologist, Dr. Elonda Husky, on March 8 to discuss future options.  While taking Xarelto, avoid aspirin and NSAIDs which can thin your blood even more.  For your liver, we started you on spironolactone to help with fluid overload.  Please continue taking Lasix daily, as well.  We have scheduled you an appointment with your PCP on Thursday, Feb 25 at 1:30pm.  It will be important to attend this appointment to check your blood counts.  We also recommend continuing to check your blood counts every 1-2 weeks so we can catch a GI bleed earlier.

## 2019-09-19 NOTE — Discharge Summary (Addendum)
Name: Brandon [name] MRN: [id number] DOB: 09/19/1945 74 y.o. PCP: Brandon Rima, MD  Date of Admission: 09/16/2019  4:26 AM Date of Discharge: 09/19/2019 Attending Physician: Brandon Kilts, MD  Discharge Diagnosis: 1. Symptomatic anemia secondary to likely GI bleed  2. Atrial fibrillation 3.  Compensated cirrhosis 4.  CAD s/p CABG 5.  HTN 6.  DM  Discharge Medications: Allergies as of 09/19/2019   No Known Allergies      Medication List     STOP taking these medications    lisinopril 40 MG tablet Commonly known as: ZESTRIL       TAKE these medications    allopurinol 300 MG tablet Commonly known as: ZYLOPRIM Take 300 mg by mouth at bedtime.   amiodarone 200 MG tablet Commonly known as: PACERONE Take 200 mg by mouth daily.   Artificial Tears 1.4 % ophthalmic solution Generic drug: polyvinyl alcohol Place 1 drop into the left eye 3 (three) times daily.   ascorbic acid 1000 MG tablet Commonly known as: VITAMIN C Take 1 tablet (1,000 mg total) by mouth daily.   aspirin EC 81 MG tablet Take 81 mg by mouth daily.   cholecalciferol 25 MCG (1000 UNIT) tablet Commonly known as: VITAMIN D3 Take 2 tablets (2,000 Units total) by mouth 2 (two) times daily. What changed: when to take this   diclofenac Sodium 1 % Gel Commonly known as: VOLTAREN Apply 2 g topically 4 (four) times daily as needed (pain).   diltiazem 180 MG 24 hr capsule Commonly known as: CARDIZEM CD Take 180 mg by mouth daily.   Durezol 0.05 % Emul Generic drug: Difluprednate Place 1 drop into the right eye 4 (four) times daily.   ferrous sulfate 325 (65 FE) MG tablet Take 325 mg by mouth daily.   finasteride 5 MG tablet Commonly known as: PROSCAR Take 5 mg by mouth daily.   folic acid 1 MG tablet Commonly known as: FOLVITE Take 1 tablet (1 mg total) by mouth daily.   furosemide 20 MG tablet Commonly known as: LASIX Take 20 mg by mouth daily.   gatifloxacin 0.5 %  Soln Commonly known as: ZYMAXID Place 1 drop into the right eye 4 (four) times daily.   ketorolac 0.5 % ophthalmic solution Commonly known as: ACULAR Place 1 drop into the right eye 4 (four) times daily.   magnesium oxide 400 (241.3 Mg) MG tablet Commonly known as: MAG-OX Take 1 tablet (400 mg total) by mouth daily.   metFORMIN 500 MG 24 hr tablet Commonly known as: GLUCOPHAGE-XR Take 500 mg by mouth daily with breakfast.   multivitamin with minerals Tabs tablet Take 1 tablet by mouth daily.   pantoprazole 40 MG tablet Commonly known as: PROTONIX Take 1 pill twice a day for 1 week. Then decrease to 1 pill daily.   polyethylene glycol 17 g packet Commonly known as: MIRALAX / GLYCOLAX Take 17 g by mouth 2 (two) times daily. What changed:  when to take this additional instructions   potassium chloride 10 MEQ tablet Commonly known as: KLOR-CON Take 10 mEq by mouth daily.   pravastatin 10 MG tablet Commonly known as: PRAVACHOL Take 10 mg by mouth at bedtime.   senna-docusate 8.6-50 MG tablet Commonly known as: Senokot-S Take 1 tablet by mouth 2 (two) times daily. What changed: when to take this   spironolactone 50 MG tablet Commonly known as: Aldactone Take 1 tablet (50 mg total) by mouth daily for 30 doses. Start taking on: September 20, 2019   tamsulosin 0.4 MG Caps capsule Commonly known as: FLOMAX Take 0.4 mg by mouth at bedtime.   thiamine 100 MG tablet Take 1 tablet (100 mg total) by mouth daily.   Xarelto 20 MG Tabs tablet Generic drug: rivaroxaban Take 20 mg by mouth daily.        Disposition and follow-up:   Brandon [name] was discharged from [facility] in Stable condition.  At the hospital follow up visit please address:  1.   Symptomatic anemia d/t GI bleed - Frequent outpatient Hb/hematocrit assays -daily ferrous sulfate tablet  -Protonix 40 mg bid for 10 d then 1 x daily -09/27/19 appt w Dr Brandon [name]  Atrial fibrillation -   Restarted Xarelto at discharge -  F/u with Cardiology October 03, 2019 (Dr[facility] Loni Beckwith with [facility]) -- consider ablation, cardioversion, antiarrhythmic, or watchman device, as high bleeding risk on Xarelto  Compensated cirrhosis -  Continue Lasix 20 mg daily -  started spironolactone 50 mg daily  CAD s/p CABG -  Diltiazem held during hospitalization, but restarted at discharge  HTN -  Lisinopril held at discharge due to soft BPs during hospitalization  Hemangioma: -  Seen on CTAP 1/17.  Consider f/u MRI abd liver mass protocol in outpatient setting   2.  Labs / imaging needed at time of follow-up: H/H, BMP (K+ level given just started spironolactone)  3.  Pending labs/ test needing follow-up: n/a  Follow-up Appointments: Follow-up Information     Brandon Rima, MD. Go on 09/22/2019.   Specialty: Family Medicine Why: 1:30pm Contact information: [place] [address] [phone]         Brandon Beckwith Neita Goodnight., MD Follow up on 10/03/2019.   Specialty: Cardiology Why: 11:15am Contact information: [address] [place] [address] [place] by problem list: Brandon [name] is a 74 y,o m with dm2, cad s/p cabg, afib on xarelto, aud, hx of colon cancer s/p resection and resection, cirrhosis presented with symptomatic anemia.   Symptomatic anemia due to GI bleed Recent admission 1/16-1/19 during which hematin found in stomach on EGD with bleeding AVM in greater curvature of stomach s/p APC. 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 Colonoscopy showed two 3-50mm polyps in transverse colon and ascending colon that were removed. 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 No active bleeding was  found during these studies.   Bleeding was likely from gastric avm vs portal htn gastropathy exacerbated by anticoagulation.  Hgb remained stable at 8.0 after initial transfusion 2 units pRBCs. Restarted Xarelto and will discharge today with instructions to follow up closely with PCP to monitor H/H.  Continue protonix 40 mg BID.     Atrial fibrillation    Rates ranging 60-80s and hemodynamically stable during hospitalization.  Continued amiodarone.  Held Xarelto initially, then continued at discharge.  Patient to follow up with cardiology outpatient setting (Dr. Elonda Husky with [facility]).  Given two GI bleeds within the past two months, consider alternative to anticoagulation given bleeding risk (e.g., ablation, antiarrythmic, Watchman device)   Compensated cirrhosis  Thought to be secondary to [facility] and alcohol use. Continued Lasix 20 po daily.  Started spironolactone.   CAD s/p CABG Initially  held diltiazem d/t soft Bps.  Restart diltiazem 180mg  qd at discharge, as BP increased.   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 F/u outpt mri abd  HTN:   Will continue to hold lisinopril on discharge d/t soft Bps during hospitalization   DM: Restart home metformin on discharge.   Discharge Vitals:   BP 123/60 (BP Location: Left Arm)   Pulse 84   Temp 98.1 F (36.7 C) (Oral)   Resp 18   Wt (!) 144.8 kg   SpO2 99%   BMI 51.52 kg/m   Pertinent Labs, Studies, and Procedures:  CBC Latest Ref Rng & Units 09/19/2019 09/18/2019 09/18/2019  WBC 4.0 - 10.5 K/uL 5.3 5.7 5.4  Hemoglobin 13.0 - 17.0 g/dL 8.0(L) 8.1(L) 7.2(L)  Hematocrit 39.0 - 52.0 % 26.9(L) 26.6(L) 24.2(L)  Platelets 150 - 400 K/uL 175 176 172   BMP Latest Ref Rng & Units 09/19/2019 09/17/2019 09/16/2019  Glucose 70 - 99 mg/dL 122(H) 110(H) 136(H)  BUN 8 - 23 mg/dL 15 20 32(H)  Creatinine 0.61 - 1.24 mg/dL 1.12 1.09 1.31(H)  Sodium 135 - 145 mmol/L 135 135 140  Potassium 3.5 - 5.1 mmol/L 4.0 4.5 4.7  Chloride 98 - 111 mmol/L 101 104 109  CO2  22 - 32 mmol/L 25 21(L) 21(L)  Calcium 8.9 - 10.3 mg/dL 8.9 8.7(L) 8.7(L)   EGD 09/17/19: Esophagus normal, single angiodysplastic lesion without bleeding in the greater curvature of the stomach. Coagulation for tissue destruction using monopolar probe was successful. Duodenum normal. No specimen collected.  Colonoscopy 09/17/19: Two 3-28mm polyps in the transverse, ascending colon removed with cold snare. Resected and retreived. Diverticulosis in sigmoid and descending colon. Patent functional end to end ileo-colonic anastamosis with heathy appearing mucosa.   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 Some hematin in gastric lumen and small bowel.   Discharge Instructions: Discharge Instructions     Call MD for:  extreme fatigue   Complete by: As directed    Call MD for:  persistant dizziness or light-headedness   Complete by: As directed    Call MD for:  persistant nausea and vomiting   Complete by: As directed    Diet - low sodium heart healthy   Complete by: As directed    Increase activity slowly   Complete by: As directed        Signed: Lars Mage, MD 09/19/2019, 1:50 PM     Attestation for Student Documentation:  I personally was present and performed or re-performed the history, physical exam and medical decision-making activities of this service and have verified that the service and findings are accurately documented in the student's note.  Lars Mage, MD 09/19/2019, 1:50 PM

## 2019-09-20 LAB — SURGICAL PATHOLOGY

## 2019-09-23 LAB — SUSCEPTIBILITY, AER + ANAEROB

## 2019-09-23 LAB — SUSCEPTIBILITY RESULT

## 2019-09-26 ENCOUNTER — Encounter: Payer: Self-pay | Admitting: *Deleted

## 2019-09-26 LAB — URINE CULTURE
Culture: >=100000 — AB
Special Requests: NORMAL

## 2019-09-27 ENCOUNTER — Encounter: Payer: Self-pay | Admitting: Internal Medicine

## 2019-09-27 ENCOUNTER — Other Ambulatory Visit: Payer: Self-pay

## 2019-09-27 ENCOUNTER — Ambulatory Visit (INDEPENDENT_AMBULATORY_CARE_PROVIDER_SITE_OTHER): Payer: Medicare Other | Admitting: Internal Medicine

## 2019-09-27 VITALS — BP 118/68 | HR 73 | Temp 98.5°F | Ht 66.0 in | Wt 293.0 lb

## 2019-09-27 DIAGNOSIS — K7031 Alcoholic cirrhosis of liver with ascites: Secondary | ICD-10-CM

## 2019-09-27 DIAGNOSIS — K31811 Angiodysplasia of stomach and duodenum with bleeding: Secondary | ICD-10-CM | POA: Diagnosis not present

## 2019-09-27 DIAGNOSIS — Z85038 Personal history of other malignant neoplasm of large intestine: Secondary | ICD-10-CM

## 2019-09-27 DIAGNOSIS — D500 Iron deficiency anemia secondary to blood loss (chronic): Secondary | ICD-10-CM

## 2019-09-27 DIAGNOSIS — K769 Liver disease, unspecified: Secondary | ICD-10-CM | POA: Diagnosis not present

## 2019-09-27 NOTE — Patient Instructions (Addendum)
You have been scheduled for an MRI abdomen at [facility]) on 10/24/19. Your appointment time is 4:30 pm. Please arrive 20 minutes prior to your appointment time for registration purposes. Please make certain not to have anything to eat or drink 6 hours prior to your test. In addition, if you have any metal in your body, have a pacemaker or defibrillator, please be sure to let your ordering physician know. This test typically takes 45 minutes to 1 hour to complete. Should you need to reschedule, please call [phone] to do so.  Continue iron supplement.  Continue lasix.  Continue aldactone.  Continue pantoprazole.  Change senna to every other day dosing.  Please follow up with Dr Hilarie Fredrickson in 3 months.  If you are age 32 or older, your body mass index should be between 23-30. Your Body mass index is 47.29 kg/m. If this is out of the aforementioned range listed, please consider follow up with your Primary Care Provider.  If you are age 50 or younger, your body mass index should be between 19-25. Your Body mass index is 47.29 kg/m. If this is out of the aformentioned range listed, please consider follow up with your Primary Care Provider.

## 2019-09-27 NOTE — Progress Notes (Signed)
Subjective:    Patient ID: Brandon [name], male    DOB: 08/25/1945, 74 y.o.   MRN: [id number]  HPI Kristjan Derner is a 74 year old male with recent acute upper GI bleeding secondary to a gastric AVM, acute on chronic anemia, cirrhosis new diagnosis recently based on imaging, history of alcohol abuse (no alcohol since March 2020), history of colon cancer status [name] surgical resection in 1999, asymptomatic gallstones, atrial fibrillation on Xarelto, morbid obesity, diabetes, arthritis who is seen in the office for hospital follow-up.  I met him during hospitalization in January 2021, he was then rehospitalized in February for 3 days with symptomatic anemia.  He is here alone today.  During January hospitalization he had EGD on 08/14/2019 which showed a bleeding AVM in the gastric curvature treated with APC.  There were no signs of gastric, esophageal varices or portal gastropathy.  CT scan during that time showed a 5.9 cm ill-defined stable hepatic mass, splenomegaly, small volume ascites.  During the February 2021 hospitalization with recurrent anemia he underwent repeat upper endoscopy, had a colonoscopy and video capsule endoscopy.  EGD revealed another single gastric angiodysplastic lesion treated with monopolar probe.  Normal esophagus and duodenum.  Colonoscopy to the terminal ileum revealed a ileocolonic anastomosis in the ascending colon.  Diverticulosis in the sigmoid and descending colon.  And 2 polyps were removed from the ascending colon and transverse colon with snare.  These were adenomatous without high-grade dysplasia.  The largest polyp was 4 mm.  Video capsule endoscopy revealed scant hematin in the stomach felt to be secondary to the recently ablated AVM.  The small bowel was normal.  He reports that he has been feeling well.  No further black stools.  No blood in his stool or melena.  He continues to have issues with joint pains.  He is using Lasix 20 mg a day and Aldactone 50 mg a day.   He has dark stools with the oral iron that he is taking.  He has noticed some mild diarrhea of late and so he has been backing off senna.  He was previously using senna 1 capsule/day.  And MiraLAX as needed.  He is living with his mother.  He has not been driving.  His primary care has arranged for him to have home health services.  He has continued pantoprazole 40 mg a day.  He did resume Xarelto after recent hospitalization.     Review of Systems As per HPI, otherwise negative  Current Medications, Allergies, Past Medical History, Past Surgical History, Family History and Social History were reviewed in [employer] record.     Objective:   Physical Exam BP 118/68   Pulse 73   Temp 98.5 F (36.9 C)   Ht _0  (1.676 m)   Wt 293 lb (132.9 kg)   BMI 47.29 kg/m  Gen: awake, alert, NAD HEENT: anicteric CV: RRR Pulm: CTA b/l Abd: soft, NT/ND, +BS throughout Ext: no c/c/e Neuro: nonfocal  Labs very recently taken from care everywhere primary care dated 09/22/2019 Hemoglobin 8.2, platelet count 180, white count 5.2 Creatinine 1.2 AST 20, ALT 19, total bili 0.3, alk phos 128 Albumin 3.8      Assessment & Plan:    74 year old male with recent acute upper GI bleeding secondary to a gastric AVM, acute on chronic anemia, cirrhosis new diagnosis recently based on imaging, history of alcohol abuse (no alcohol since March 2020), history of colon cancer status [name] surgical resection in 1999,  asymptomatic gallstones, atrial fibrillation on Xarelto, morbid obesity, diabetes, arthritis who is seen in the office for hospital follow-up.  1.  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  Hemoglobin has stabilized and Xarelto has been added back.  He should remain on PPI daily.  Will need hemoglobin and iron studies followed closely.  Small bowel capsule endoscopy and colonoscopy did  not reveal GI bleeding --Hemoglobin has stabilized checked recently and is improving from hospitalization --Continue daily PPI --Continue daily iron supplementation --Follow-up hemoglobin and iron studies in about 2 to 3 months  2.  Cirrhosis --likely alcohol induced and seen by recent imaging.  He has not had alcohol in over 1 year and we discussed the importance that he maintain abstinence.  He does not appear to have significant portal hypertension given that he has normal platelets.  He did not have varices at recent upper endoscopy.  He did have trace amount of ascites on early imaging and he does have lower extremity edema.  He will continue low-dose diuretics --Variceal screening --- February 2021, repeat February 2023 --[place] screening, see #3 --No evidence for hepatic encephalopathy --Mild ascites and lower extremity edema --continue Lasix 20 mg and Aldactone 50 mg daily --Continue alcohol abstinence  3.  [place] screening in the setting of cirrhosis/liver lesion --he had a liver lesion seen in segment 4B on CT scan during hospitalization in January 2021.  This needs further characterization with MRI --Proceed with open MRI of the abdomen with and without contrast hepatic protocol  4.  Intermittent loose stools --decrease senna to every other day  5.  Personal history of colon cancer --recent colonoscopy with 2 small adenomas, repeat recommended in 5 years which would be February 2026  57-monthfollow-up with me  40 minutes total spent today including patient facing time, coordination of care, reviewing medical history/procedures/pertinent radiology studies, and documentation of the encounter.

## 2019-10-02 ENCOUNTER — Other Ambulatory Visit: Payer: Self-pay

## 2019-10-02 ENCOUNTER — Emergency Department (HOSPITAL_COMMUNITY): Payer: Medicare Other

## 2019-10-02 ENCOUNTER — Encounter (HOSPITAL_COMMUNITY): Payer: Self-pay

## 2019-10-02 ENCOUNTER — Inpatient Hospital Stay (HOSPITAL_COMMUNITY)
Admission: EM | Admit: 2019-10-02 | Discharge: 2019-10-06 | DRG: 442 | Disposition: A | Discharge status: Home / Self Care | Payer: Medicare Other | Attending: Internal Medicine | Admitting: Internal Medicine

## 2019-10-02 DIAGNOSIS — Z85038 Personal history of other malignant neoplasm of large intestine: Secondary | ICD-10-CM

## 2019-10-02 DIAGNOSIS — N189 Chronic kidney disease, unspecified: Secondary | ICD-10-CM | POA: Diagnosis present

## 2019-10-02 DIAGNOSIS — R791 Abnormal coagulation profile: Secondary | ICD-10-CM | POA: Diagnosis present

## 2019-10-02 DIAGNOSIS — Z66 Do not resuscitate: Secondary | ICD-10-CM | POA: Diagnosis present

## 2019-10-02 DIAGNOSIS — I8510 Secondary esophageal varices without bleeding: Secondary | ICD-10-CM | POA: Diagnosis present

## 2019-10-02 DIAGNOSIS — Z79899 Other long term (current) drug therapy: Secondary | ICD-10-CM

## 2019-10-02 DIAGNOSIS — D62 Acute posthemorrhagic anemia: Secondary | ICD-10-CM | POA: Diagnosis present

## 2019-10-02 DIAGNOSIS — M47814 Spondylosis without myelopathy or radiculopathy, thoracic region: Secondary | ICD-10-CM | POA: Diagnosis present

## 2019-10-02 DIAGNOSIS — Z20822 Contact with and (suspected) exposure to covid-19: Secondary | ICD-10-CM | POA: Diagnosis present

## 2019-10-02 DIAGNOSIS — Z9049 Acquired absence of other specified parts of digestive tract: Secondary | ICD-10-CM | POA: Diagnosis not present

## 2019-10-02 DIAGNOSIS — B3781 Candidal esophagitis: Secondary | ICD-10-CM | POA: Diagnosis present

## 2019-10-02 DIAGNOSIS — I129 Hypertensive chronic kidney disease with stage 1 through stage 4 chronic kidney disease, or unspecified chronic kidney disease: Secondary | ICD-10-CM | POA: Diagnosis present

## 2019-10-02 DIAGNOSIS — K7030 Alcoholic cirrhosis of liver without ascites: Secondary | ICD-10-CM

## 2019-10-02 DIAGNOSIS — K7031 Alcoholic cirrhosis of liver with ascites: Secondary | ICD-10-CM | POA: Diagnosis present

## 2019-10-02 DIAGNOSIS — K922 Gastrointestinal hemorrhage, unspecified: Secondary | ICD-10-CM | POA: Diagnosis not present

## 2019-10-02 DIAGNOSIS — E1122 Type 2 diabetes mellitus with diabetic chronic kidney disease: Secondary | ICD-10-CM | POA: Diagnosis present

## 2019-10-02 DIAGNOSIS — I2510 Atherosclerotic heart disease of native coronary artery without angina pectoris: Secondary | ICD-10-CM | POA: Diagnosis present

## 2019-10-02 DIAGNOSIS — Z7901 Long term (current) use of anticoagulants: Secondary | ICD-10-CM

## 2019-10-02 DIAGNOSIS — D649 Anemia, unspecified: Secondary | ICD-10-CM

## 2019-10-02 DIAGNOSIS — Z951 Presence of aortocoronary bypass graft: Secondary | ICD-10-CM | POA: Diagnosis not present

## 2019-10-02 DIAGNOSIS — Z8249 Family history of ischemic heart disease and other diseases of the circulatory system: Secondary | ICD-10-CM | POA: Diagnosis not present

## 2019-10-02 DIAGNOSIS — K921 Melena: Secondary | ICD-10-CM | POA: Diagnosis present

## 2019-10-02 DIAGNOSIS — K31819 Angiodysplasia of stomach and duodenum without bleeding: Secondary | ICD-10-CM | POA: Diagnosis present

## 2019-10-02 DIAGNOSIS — K5730 Diverticulosis of large intestine without perforation or abscess without bleeding: Secondary | ICD-10-CM | POA: Diagnosis present

## 2019-10-02 DIAGNOSIS — F1021 Alcohol dependence, in remission: Secondary | ICD-10-CM | POA: Diagnosis present

## 2019-10-02 DIAGNOSIS — I480 Paroxysmal atrial fibrillation: Secondary | ICD-10-CM | POA: Diagnosis present

## 2019-10-02 DIAGNOSIS — K766 Portal hypertension: Principal | ICD-10-CM | POA: Diagnosis present

## 2019-10-02 DIAGNOSIS — K3189 Other diseases of stomach and duodenum: Secondary | ICD-10-CM | POA: Diagnosis not present

## 2019-10-02 DIAGNOSIS — Z7982 Long term (current) use of aspirin: Secondary | ICD-10-CM

## 2019-10-02 DIAGNOSIS — Z800 Family history of malignant neoplasm of digestive organs: Secondary | ICD-10-CM

## 2019-10-02 LAB — CBC WITH DIFFERENTIAL/PLATELET
Abs Immature Granulocytes: 0.06 10*3/uL (ref 0.00–0.07)
Basophils Absolute: 0.1 10*3/uL (ref 0.0–0.1)
Basophils Relative: 1 %
Eosinophils Absolute: 0.3 10*3/uL (ref 0.0–0.5)
Eosinophils Relative: 3 %
HCT: 22 % — ABNORMAL LOW (ref 39.0–52.0)
Hemoglobin: 6.1 g/dL — CL (ref 13.0–17.0)
Immature Granulocytes: 1 %
Lymphocytes Relative: 11 %
Lymphs Abs: 0.9 10*3/uL (ref 0.7–4.0)
MCH: 28.5 pg (ref 26.0–34.0)
MCHC: 27.7 g/dL — ABNORMAL LOW (ref 30.0–36.0)
MCV: 102.8 fL — ABNORMAL HIGH (ref 80.0–100.0)
Monocytes Absolute: 0.7 10*3/uL (ref 0.1–1.0)
Monocytes Relative: 8 %
Neutro Abs: 6.5 10*3/uL (ref 1.7–7.7)
Neutrophils Relative %: 76 %
Platelets: 284 10*3/uL (ref 150–400)
RBC: 2.14 MIL/uL — ABNORMAL LOW (ref 4.22–5.81)
RDW: 18.8 % — ABNORMAL HIGH (ref 11.5–15.5)
WBC: 8.5 10*3/uL (ref 4.0–10.5)
nRBC: 0 % (ref 0.0–0.2)

## 2019-10-02 LAB — COMPREHENSIVE METABOLIC PANEL
ALT: 22 U/L (ref 0–44)
AST: 23 U/L (ref 15–41)
Albumin: 3.4 g/dL — ABNORMAL LOW (ref 3.5–5.0)
Alkaline Phosphatase: 92 U/L (ref 38–126)
Anion gap: 10 (ref 5–15)
BUN: 29 mg/dL — ABNORMAL HIGH (ref 8–23)
CO2: 21 mmol/L — ABNORMAL LOW (ref 22–32)
Calcium: 9.3 mg/dL (ref 8.9–10.3)
Chloride: 103 mmol/L (ref 98–111)
Creatinine, Ser: 1.33 mg/dL — ABNORMAL HIGH (ref 0.61–1.24)
GFR calc Af Amer: >60 mL/min (ref >60)
GFR calc non Af Amer: 53 mL/min — ABNORMAL LOW (ref >60)
Glucose, Bld: 148 mg/dL — ABNORMAL HIGH (ref 70–99)
Potassium: 4.8 mmol/L (ref 3.5–5.1)
Sodium: 134 mmol/L — ABNORMAL LOW (ref 135–145)
Total Bilirubin: 0.3 mg/dL (ref 0.3–1.2)
Total Protein: 6.4 g/dL — ABNORMAL LOW (ref 6.5–8.1)

## 2019-10-02 LAB — PROTIME-INR
INR: 2.5 — ABNORMAL HIGH (ref 0.8–1.2)
Prothrombin Time: 27.3 seconds — ABNORMAL HIGH (ref 11.4–15.2)

## 2019-10-02 LAB — RESPIRATORY PANEL BY RT PCR (FLU A&B, COVID)
Influenza A by PCR: NEGATIVE
Influenza B by PCR: NEGATIVE
SARS Coronavirus 2 by RT PCR: NEGATIVE

## 2019-10-02 LAB — PHOSPHORUS: Phosphorus: 4.2 mg/dL (ref 2.5–4.6)

## 2019-10-02 LAB — PREPARE RBC (CROSSMATCH)

## 2019-10-02 LAB — MAGNESIUM: Magnesium: 2 mg/dL (ref 1.7–2.4)

## 2019-10-02 LAB — GLUCOSE, CAPILLARY: Glucose-Capillary: 114 mg/dL — ABNORMAL HIGH (ref 70–99)

## 2019-10-02 IMAGING — DX DG CHEST 1V PORT
1 series · 1 of 1 positions shown · non-contrast
Comparison: Chest radiograph [DATE]

CLINICAL DATA: Shortness of breath. Additional history provided:
Dark tarry stool for 1 day, fatigue, shortness of breath

EXAM:
PORTABLE CHEST 1 VIEW

[chest ap]
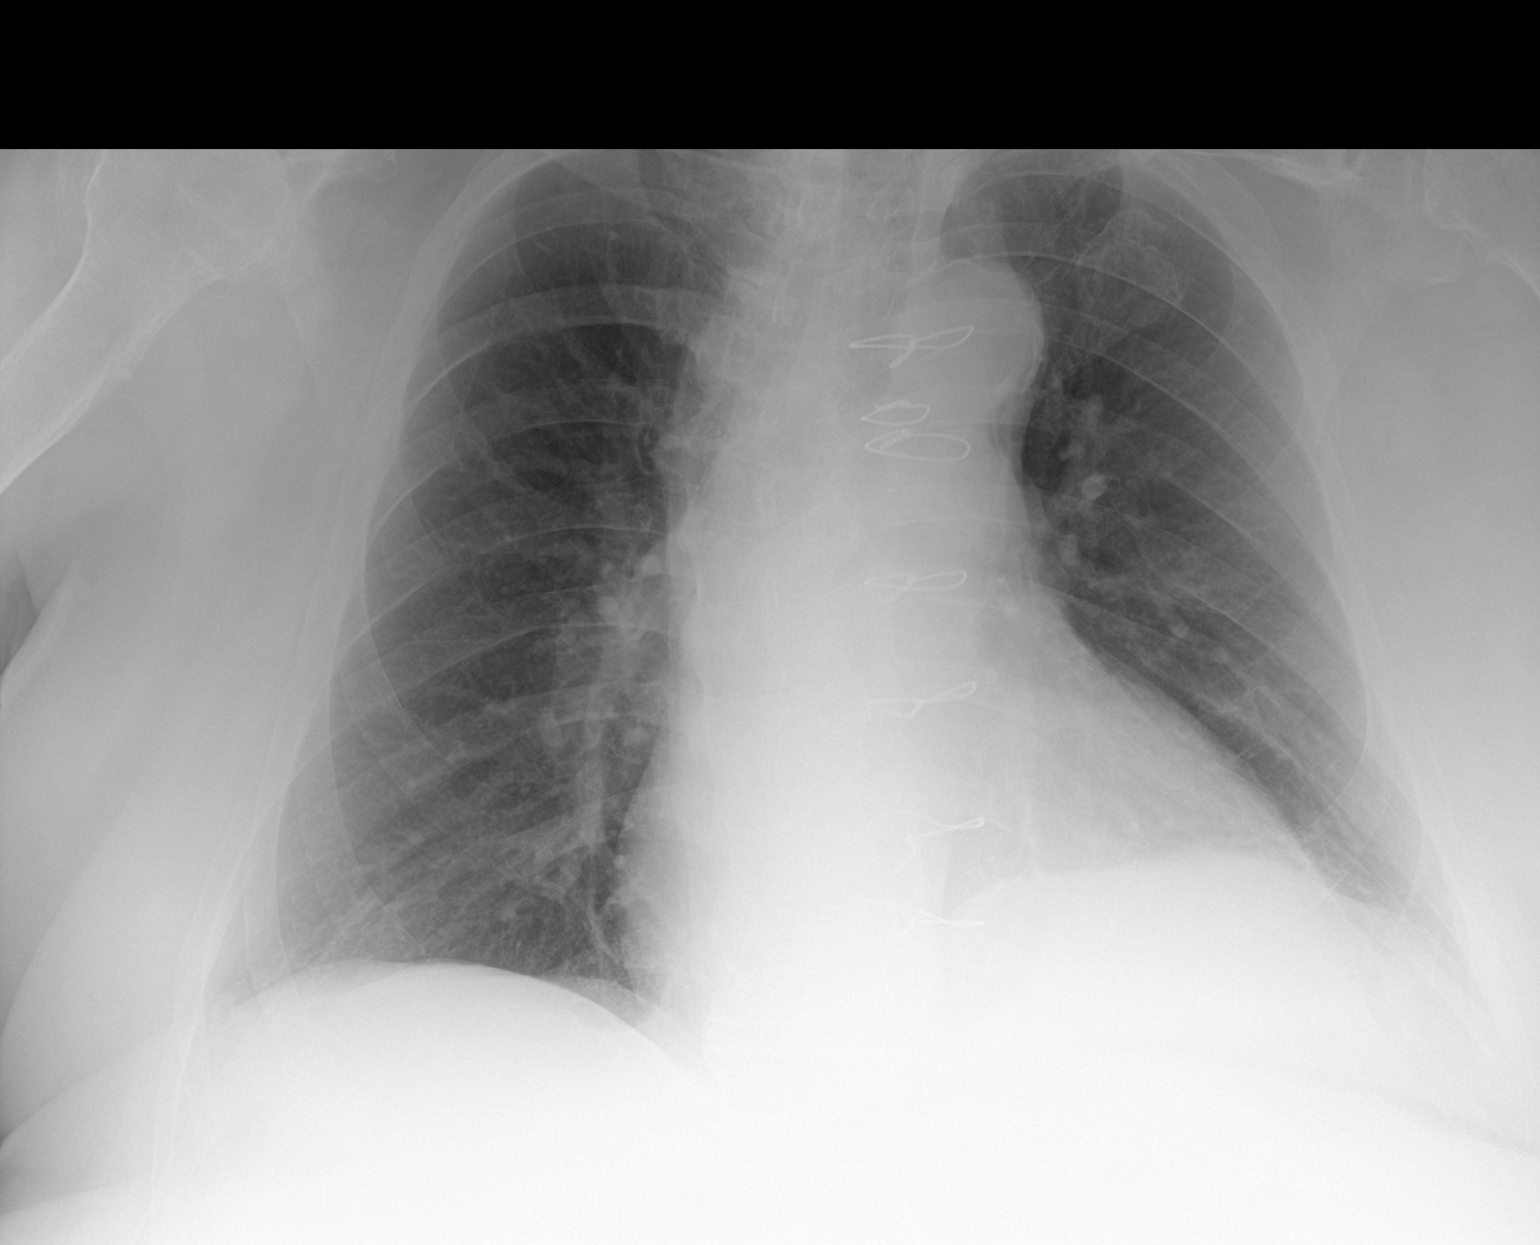

[1 of 1 positions shown; findings below may reference images not displayed]

FINDINGS: Prior median sternotomy and CABG. Unchanged cardiomegaly. Aortic
atherosclerosis. No evidence of airspace consolidation within the
lungs. Minimal left basilar atelectasis. No evidence of pleural
effusion or pneumothorax. No acute bony abnormality. Thoracic
spondylosis.
IMPRESSION: Minimal left basilar atelectasis.

Otherwise, no evidence of acute cardiopulmonary abnormality.

Unchanged cardiomegaly.  Aortic atherosclerosis.

## 2019-10-02 MED ORDER — VITAMIN D 25 MCG (1000 UNIT) PO TABS
2000.0000 [IU] | ORAL_TABLET | Freq: Every day | ORAL | Status: DC
  Administered 2019-10-03 – 2019-10-06 (×4): 2000 [IU] via ORAL
  Filled 2019-10-02 (×4): qty 2

## 2019-10-02 MED ORDER — FOLIC ACID 1 MG PO TABS
1.0000 mg | ORAL_TABLET | Freq: Every day | ORAL | Status: DC
  Administered 2019-10-03 – 2019-10-06 (×4): 1 mg via ORAL
  Filled 2019-10-02 (×4): qty 1

## 2019-10-02 MED ORDER — THIAMINE HCL 100 MG PO TABS
100.0000 mg | ORAL_TABLET | Freq: Every day | ORAL | Status: DC
  Administered 2019-10-03 – 2019-10-06 (×4): 100 mg via ORAL
  Filled 2019-10-02 (×4): qty 1

## 2019-10-02 MED ORDER — AMIODARONE HCL 200 MG PO TABS
200.0000 mg | ORAL_TABLET | Freq: Every day | ORAL | Status: DC
  Administered 2019-10-03 – 2019-10-06 (×4): 200 mg via ORAL
  Filled 2019-10-02 (×4): qty 1

## 2019-10-02 MED ORDER — PRAVASTATIN SODIUM 20 MG PO TABS
10.0000 mg | ORAL_TABLET | Freq: Every day | ORAL | Status: DC
  Administered 2019-10-02 – 2019-10-05 (×4): 10 mg via ORAL
  Filled 2019-10-02 (×4): qty 1

## 2019-10-02 MED ORDER — PANTOPRAZOLE SODIUM 40 MG IV SOLR
40.0000 mg | Freq: Two times a day (BID) | INTRAVENOUS | Status: DC
  Administered 2019-10-03 – 2019-10-06 (×7): 40 mg via INTRAVENOUS
  Filled 2019-10-02 (×7): qty 40

## 2019-10-02 MED ORDER — ALLOPURINOL 300 MG PO TABS
300.0000 mg | ORAL_TABLET | Freq: Every day | ORAL | Status: DC
  Administered 2019-10-02 – 2019-10-05 (×4): 300 mg via ORAL
  Filled 2019-10-02 (×4): qty 1

## 2019-10-02 MED ORDER — INSULIN ASPART 100 UNIT/ML ~~LOC~~ SOLN
0.0000 [IU] | SUBCUTANEOUS | Status: DC
  Administered 2019-10-04: 3 [IU] via SUBCUTANEOUS
  Administered 2019-10-04 – 2019-10-05 (×2): 2 [IU] via SUBCUTANEOUS

## 2019-10-02 MED ORDER — ONDANSETRON HCL 4 MG/2ML IJ SOLN: 4.0000 mg | Freq: Four times a day (QID) | INTRAMUSCULAR | Status: DC | PRN

## 2019-10-02 MED ORDER — ADULT MULTIVITAMIN W/MINERALS CH
1.0000 | ORAL_TABLET | Freq: Every day | ORAL | Status: DC
  Administered 2019-10-03 – 2019-10-06 (×4): 1 via ORAL
  Filled 2019-10-02 (×4): qty 1

## 2019-10-02 MED ORDER — SODIUM CHLORIDE 0.9 % IV SOLN
10.0000 mL/h | Freq: Once | INTRAVENOUS | Status: AC
  Administered 2019-10-03: 10 mL/h via INTRAVENOUS

## 2019-10-02 MED ORDER — PANTOPRAZOLE SODIUM 40 MG IV SOLR
80.0000 mg | Freq: Once | INTRAVENOUS | Status: AC
  Administered 2019-10-02: 80 mg via INTRAVENOUS
  Filled 2019-10-02: qty 80

## 2019-10-02 MED ORDER — ONDANSETRON HCL 4 MG PO TABS: 4.0000 mg | ORAL_TABLET | Freq: Four times a day (QID) | ORAL | Status: DC | PRN

## 2019-10-02 MED ORDER — VITAMIN K1 10 MG/ML IJ SOLN
5.0000 mg | Freq: Once | INTRAVENOUS | Status: AC
  Administered 2019-10-03: 5 mg via INTRAVENOUS
  Filled 2019-10-02: qty 0.5

## 2019-10-02 MED ORDER — FINASTERIDE 5 MG PO TABS
5.0000 mg | ORAL_TABLET | Freq: Every day | ORAL | Status: DC
  Administered 2019-10-03 – 2019-10-06 (×4): 5 mg via ORAL
  Filled 2019-10-02 (×4): qty 1

## 2019-10-02 MED ORDER — TAMSULOSIN HCL 0.4 MG PO CAPS
0.4000 mg | ORAL_CAPSULE | Freq: Every day | ORAL | Status: DC
  Administered 2019-10-02 – 2019-10-05 (×4): 0.4 mg via ORAL
  Filled 2019-10-02 (×4): qty 1

## 2019-10-02 NOTE — H&P (Addendum)
History and Physical    Brandon [name] [id number] DOB: March 02, 1946 DOA: 10/02/2019  PCP: Helane Rima, MD Patient coming from: Home  I have personally briefly reviewed patient's old medical records in [place]  Chief Complaint: Melena  HPI: Brandon [name] is a 74 y.o. male with medical history significant for cirrhosis with portal hypertension, gastric AVM with prior UGIB x2, CAD s/p CABG, colon ca s/p partial colectomy, Afib on Xarelto, DM2 and morbid obesity. He presented with 1 day of weakness and shortness of breath after large black bowel movement earlier today. He denies hematemesis, nausea, vomiting, abdominal pain. No fever, chills. Denies chest pain or palpitations. Has been taking Xarelto and Protonix once daily at home. This is his third episode of GI bleeding since January. He was most recently hospitalized from 2/19 - 2/22 at which time endoscopy once again showed gastric AVM as the suspected source of bleeding. No esophageal or gastric varices were noted. CT a/p from 08/14/2019 did comment on esophageal varices. Denies alcohol intake since diagnosis of cirrhosis 1 year ago.  ED Course: In the ED, he was afebrile and hemodynamically stable. Labs notable for Hgb 6.1, down from 8.0 on 2/22, INR elevated at 2.5, BUN 29, Cr 1.33 (baseline 1.1-1.2) and normal electrolytes and liver function. CXR redemonstrated cardiomegaly and left basilar atelectasis. He received 2U PRBC and 80 mg IV Protonix in the ED. GI consulted with plans for non-emergent endoscopy in the morning.  Review of Systems: As per HPI otherwise 10 point review of systems negative.   Past Medical History:  Diagnosis Date  . Anemia 07/2019   Hb 6.7 >> 3 PRBCs >> 8.8 in 07/2019.     Marland Kitchen Atrial fibrillation ([name])   . Cholelithiasis   . Cirrhosis of liver ([name])    Presumed due to EtOH as well as fatty liver from morbid obesity.  Cirrhosis evident on CT scan 07/2018 but finding overlooked and formal diagnoses not made  until 07/2019  . Colon cancer ([name]) 1999   Partial colectomy  . Coronary artery disease   . Diabetes mellitus without complication ([name])   . Diverticulosis   . Esophageal varices ([name])   . Gastric AVM   . 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 Obesity 07/2019  . Portal hypertension ([name])   . Tubular adenoma of colon     Past Surgical History:  Procedure Laterality Date  . APPLICATION OF WOUND VAC Right 09/30/2018   Procedure: Application Of Wound Vac;  Surgeon: [name], MD;  Location: [facility];  Service: Orthopedics;  Laterality: Right;  . COLONOSCOPY  2010   Removed 2 polyps of unclear type.  Not performed in [place]  . COLONOSCOPY WITH PROPOFOL N/A 09/17/2019   Procedure: COLONOSCOPY WITH PROPOFOL;  Surgeon: [name], MD;  Location: [place];  Service: Endoscopy;  Laterality: N/A;  . CORONARY ARTERY BYPASS GRAFT     in his 49s  . ESOPHAGOGASTRODUODENOSCOPY (EGD) WITH PROPOFOL N/A 08/14/2019   Procedure: ESOPHAGOGASTRODUODENOSCOPY (EGD) WITH PROPOFOL;  Surgeon: [name], MD;  Location: [facility];  Service: Gastroenterology;  Laterality: N/A;  . ESOPHAGOGASTRODUODENOSCOPY (EGD) WITH PROPOFOL N/A 09/17/2019   Procedure: ESOPHAGOGASTRODUODENOSCOPY (EGD) WITH PROPOFOL;  Surgeon: [name], MD;  Location: [facility];  Service: Endoscopy;  Laterality: N/A;  . GIVENS CAPSULE STUDY N/A 09/17/2019   Procedure: GIVENS CAPSULE STUDY;  Surgeon: [name], MD;  Location: [place];  Service: Endoscopy;  Laterality: N/A;  . HOT HEMOSTASIS N/A 08/14/2019  Procedure: HOT HEMOSTASIS (ARGON PLASMA COAGULATION/BICAP);  Surgeon: [name], MD;  Location[facility] ENDOSCOPY;  Service: Gastroenterology;  Laterality: N/A;  . HOT HEMOSTASIS N/A 09/17/2019   Procedure: HOT HEMOSTASIS (ARGON PLASMA COAGULATION/BICAP);  Surgeon: [name], MD;  Location: [facility];  Service: Endoscopy;  Laterality: N/A;  . PARTIAL COLECTOMY  2003   To address  colon cancer  . PATELLAR TENDON REPAIR Right 09/30/2018   Procedure: PATELLA TENDON REPAIR;  Surgeon: [name], MD;  Location: [place];  Service: Orthopedics;  Laterality: Right;  . POLYPECTOMY  09/17/2019   Procedure: POLYPECTOMY;  Surgeon: [name], MD;  Location: [place];  Service: Endoscopy;;     reports that he quit smoking about 31 years ago. His smoking use included cigarettes. He has never used smokeless tobacco. He reports current alcohol use. He reports that he does not use drugs.  No Known Allergies  Family History  Problem Relation Age of Onset  . Colon cancer Father   . CAD Other   . Colon cancer Paternal Uncle   . Colon cancer Paternal Uncle   . Diabetes Neg Hx     Prior to Admission medications   Medication Sig Start Date End Date Taking? Authorizing Provider  allopurinol (ZYLOPRIM) 300 MG tablet Take 300 mg by mouth at bedtime.  08/12/18  Yes [provider]  amiodarone (PACERONE) 200 MG tablet Take 200 mg by mouth daily. 09/05/19  Yes [provider]  aspirin EC 81 MG tablet Take 81 mg by mouth daily.   Yes [provider]  cholecalciferol (VITAMIN D3) 25 MCG (1000 UT) tablet Take 2 tablets (2,000 Units total) by mouth 2 (two) times daily. Patient taking differently: Take 2,000 Units by mouth daily.  10/08/18  Yes [name], DO  diltiazem (CARDIZEM CD) 180 MG 24 hr capsule Take 180 mg by mouth daily. 09/05/19  Yes [provider]  ferrous sulfate 325 (65 FE) MG tablet Take 325 mg by mouth daily. 11/15/15 11/15/19 Yes [provider]  finasteride (PROSCAR) 5 MG tablet Take 5 mg by mouth daily. 02/18/18  Yes [provider]  folic acid (FOLVITE) 1 MG tablet Take 1 tablet (1 mg total) by mouth daily. 10/09/18  Yes [name] C, DO  furosemide (LASIX) 20 MG tablet Take 20 mg by mouth daily. 09/05/19  Yes [provider]  magnesium oxide (MAG-OX) 400 (241.3 Mg) MG tablet Take 1 tablet (400 mg  total) by mouth daily. 08/25/18  Yes [name], MD  metFORMIN (GLUCOPHAGE-XR) 500 MG 24 hr tablet Take 500 mg by mouth daily with breakfast.  02/18/18  Yes [provider]  Multiple Vitamin (MULTIVITAMIN WITH MINERALS) TABS tablet Take 1 tablet by mouth daily.   Yes [provider]  pantoprazole (PROTONIX) 40 MG tablet Take 1 pill twice a day for 1 week. Then decrease to 1 pill daily. Patient taking differently: Take 40 mg by mouth daily.  08/16/19  Yes Bonnita Hollow, MD  polyethylene glycol [facility] / GLYCOLAX) packet Take 17 g by mouth 2 (two) times daily. Patient taking differently: Take 17 g by mouth daily as needed for mild constipation. Mix in coffee and drink 10/08/18  Yes [name] C, DO  polyvinyl alcohol (ARTIFICIAL TEARS) 1.4 % ophthalmic solution Place 1 drop into the left eye 3 (three) times daily.   Yes [provider]  potassium chloride (KLOR-CON) 10 MEQ tablet Take 10 mEq by mouth daily. 09/05/19  Yes [provider]  pravastatin (PRAVACHOL) 10 MG tablet Take 10 mg by mouth at bedtime. 02/18/18  Yes [provider]  senna-docusate (SENOKOT-S) 8.6-50 MG tablet Take 1 tablet by mouth 2 (two) times daily. Patient taking differently: Take 1 tablet by mouth every other day.  10/08/18  Yes [name], DO  spironolactone (ALDACTONE) 50 MG tablet Take 1 tablet (50 mg total) by mouth daily for 30 doses. 09/20/19 10/20/19 Yes Chundi, Vahini, MD  tamsulosin (FLOMAX) 0.4 MG CAPS capsule Take 0.4 mg by mouth at bedtime. 02/18/18  Yes [provider]  thiamine 100 MG tablet Take 1 tablet (100 mg total) by mouth daily. 10/09/18  Yes [name] C, DO  vitamin C (VITAMIN C) 1000 MG tablet Take 1 tablet (1,000 mg total) by mouth daily. 10/09/18  Yes Anderson, Hannah C, DO  XARELTO 20 MG TABS tablet Take 20 mg by mouth daily. 09/05/19  Yes [provider]  diclofenac Sodium (VOLTAREN) 1 % GEL Apply 2 g topically 4 (four)  times daily as needed (pain). Patient not taking: Reported on 10/02/2019 08/16/19   Bonnita Hollow, MD    Physical Exam: Vitals:   10/02/19 2009 10/02/19 2029 10/02/19 2041 10/02/19 2044  BP: 130/65 130/65 (!) 129/54 (!) 120/54  Pulse: 73 78 78 79  Resp: 19 18 17 16   Temp:  98.5 F (36.9 C)  98.5 F (36.9 C)  TempSrc:  Oral    SpO2: 100%  100%   Weight:      Height:        Constitutional: NAD, calm, comfortable Eyes: PERRL, lids and conjunctivae normal ENMT: Mucous membranes are moist. Posterior pharynx clear of any exudate or lesions. Neck: normal, supple, no masses Respiratory: clear to auscultation bilaterally, no wheezing, no crackles. Cardiovascular: Regular rate and rhythm, no murmurs / rubs / gallops. No extremity edema. 2+ pedal pulses. Abdomen: no tenderness, + ventral hernia. Bowel sounds positive.  Musculoskeletal: no clubbing / cyanosis. No joint deformity upper and lower extremities. Skin: numerous ecchymoses and skin tears Neurologic: CN 2-12 grossly intact. Strength 5/5 in all 4.  Psychiatric: Normal judgment and insight. Alert and oriented x 3. Normal mood.    Labs on Admission: I have personally reviewed following labs and imaging studies  CBC: Recent Labs  Lab 10/02/19 1740  WBC 8.5  NEUTROABS 6.5  HGB 6.1*  HCT 22.0*  MCV 102.8*  PLT [id number]   Basic Metabolic Panel: Recent Labs  Lab 10/02/19 1740  NA 134*  K 4.8  CL 103  CO2 21*  GLUCOSE 148*  BUN 29*  CREATININE 1.33*  CALCIUM 9.3   GFR: Estimated Creatinine Clearance: 65.8 mL/min (A) (by C-G formula based on SCr of 1.33 mg/dL (H)). Liver Function Tests: Recent Labs  Lab 10/02/19 1740  AST 23  ALT 22  ALKPHOS 92  BILITOT 0.3  PROT 6.4*  ALBUMIN 3.4*   No results for input(s): LIPASE, AMYLASE in the last 168 hours. No results for input(s): AMMONIA in the last 168 hours. Coagulation Profile: Recent Labs  Lab 10/02/19 1740  INR 2.5*   Cardiac Enzymes: No results for  input(s): CKTOTAL, CKMB, CKMBINDEX, TROPONINI in the last 168 hours. BNP (last 3 results) No results for input(s): PROBNP in the last 8760 hours. HbA1C: No results for input(s): HGBA1C in the last 72 hours. CBG: No results for input(s): GLUCAP in the last 168 hours. Lipid Profile: No results for input(s): CHOL, HDL, LDLCALC, TRIG, CHOLHDL, LDLDIRECT in the last 72 hours. Thyroid Function  Tests: No results for input(s): TSH, T4TOTAL, FREET4, T3FREE, THYROIDAB in the last 72 hours. Anemia Panel: No results for input(s): VITAMINB12, FOLATE, FERRITIN, TIBC, IRON, RETICCTPCT in the last 72 hours. Urine analysis:    Component Value Date/Time   COLORURINE YELLOW 09/16/2019 0800   APPEARANCEUR CLOUDY (A) 09/16/2019 0800   LABSPEC 1.011 09/16/2019 0800   PHURINE 5.0 09/16/2019 0800   GLUCOSEU NEGATIVE 09/16/2019 0800   HGBUR MODERATE (A) 09/16/2019 0800   BILIRUBINUR NEGATIVE 09/16/2019 0800   KETONESUR NEGATIVE 09/16/2019 0800   PROTEINUR NEGATIVE 09/16/2019 0800   NITRITE NEGATIVE 09/16/2019 0800   LEUKOCYTESUR LARGE (A) 09/16/2019 0800    Radiological Exams on Admission: DG Chest Port 1 View  Result Date: 10/02/2019 CLINICAL DATA:  Shortness of breath. Additional history provided: Dark tarry stool for 1 day, fatigue, shortness of breath EXAM: PORTABLE CHEST 1 VIEW COMPARISON:  Chest radiograph 09/16/2019 FINDINGS: Prior median sternotomy and CABG. Unchanged cardiomegaly. Aortic atherosclerosis. No evidence of airspace consolidation within the lungs. Minimal left basilar atelectasis. No evidence of pleural effusion or pneumothorax. No acute bony abnormality. Thoracic spondylosis. IMPRESSION: Minimal left basilar atelectasis. Otherwise, no evidence of acute cardiopulmonary abnormality. Unchanged cardiomegaly.  Aortic atherosclerosis. Electronically Signed   By: Kellie Simmering DO   On: 10/02/2019 19:18    Assessment/Plan Active Problems:   Melena  Symptomatic anemia 2/2 UGIB with melena -  GI consulted, plan for EGD in AM - Keep NPO - Ensure 2 large bore PIV above A/C b/l - Hold Xarelto - Continue Protonix IV BID - No clear indication for octreotide, ceftriaxone with no endoscopic documentation of varices - Maintain active T&S - Transfuse for Hgb goal >7 - Give IV vit K x1 given elevated INR and bleeding - Trend H&H  Cirrhosis - GI following - Holding Lasix, spironolactone in setting of UGIB - Avoid hepatotaxic meds - Continue folate, thiamine, MVI per home - Daily CMP, INR  CAD s/p CABG Afib - Continue amiodarone - Holding Xarelto as above; would be prudent to d/c systemic A/C given continued episodes of bleeding - Will hold diltiazem for now to avoid AV nodal blockade if further bleeding occurs - Continue home pravastatin  DM2 - NPO as above - SSI, FSBS q4   DVT prophylaxis: SCD Code Status: DNR (OK to rescind for procedures if necessary) Disposition Plan: Home in 1-2 days following EGD Consults called: GI Admission status: Inpatient   Bennie Pierini MD [facility]  If 7PM-7AM, please contact night-coverage [email or web address] Password [facility]  10/02/2019, 9:09 PM

## 2019-10-02 NOTE — ED Triage Notes (Signed)
Pt reports " I have a DNR its a pink paper on my fridge I want some intervention but not everything you know like a ventilator I don't want that just call it quits"    Nanavati MD made aware

## 2019-10-02 NOTE — ED Triage Notes (Signed)
Pt arrived via [facility] from home CC dark tarry stool X 1 day and fatigue. Pt reports having lunch today with no complications. Per EMS a/OX4 and baseline ambulatory    Hx stomach ulcer treated 2 weeks ago, Afib, diabetes

## 2019-10-02 NOTE — ED Provider Notes (Signed)
[facility] DEPT Provider Note   CSN: [id number] Arrival date & time: 10/02/19  1701     History Chief Complaint  Patient presents with  . Dark Tarry Stool    Brandon [name] is a 74 y.o. male.  HPI     74 year old male comes in a chief complaint of bloody stools/tarry appearing stools. Patient has history of atrial fibrillation and is on Xarelto.  He also has history of liver cirrhosis with portal hypertension, gastric AVM -however the recent endoscopies did not reveal esophageal varices.  He is coming in with dark tarry stools that he noted earlier today.  Patient also has been feeling dizzy, weak -therefore he decided to come to the ER.  He has not had tarry stools in the past.  Patient has required blood transfusion due to severe bleed in the past.  His last Xarelto dose was this morning.   Past Medical History:  Diagnosis Date  . Anemia 07/2019   Hb 6.7 >> 3 PRBCs >> 8.8 in 07/2019.     Marland Kitchen Atrial fibrillation ([place])   . Cholelithiasis   . Cirrhosis of liver ([place])    Presumed due to EtOH as well as fatty liver from morbid obesity.  Cirrhosis evident on CT scan 07/2018 but finding overlooked and formal diagnoses not made until 07/2019  . Colon cancer ([place]) 1999   Partial colectomy  . Coronary artery disease   . Diabetes mellitus without complication ([place])   . Diverticulosis   . Esophageal varices ([place])   . Gastric AVM   . 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 Obesity 07/2019  . Portal hypertension ([place])   . Tubular adenoma of colon     Patient Active Problem List   Diagnosis Date Noted  . Angiodysplasia of stomach with hemorrhage   . Gastrointestinal hemorrhage   . Atrial fibrillation ([place])   . Chronic pain syndrome   . Symptomatic anemia 08/13/2019  . Acute GI bleeding 08/13/2019  . Avulsion of right patellar tendon   . Closed displaced fracture of phalanx of toe of right foot 10/06/2018  . Obesity BMI over 52  09/30/2018  . Patella fracture, extensor mechanism disruption R knee  09/29/2018  . Hyponatremia 08/23/2018  . Obstipation 08/23/2018  . History of colon cancer 08/23/2018  . CAD (coronary artery disease) 08/23/2018  . DM (diabetes mellitus), type 2 ([place]) 08/23/2018  . Pyuria 08/23/2018    Past Surgical History:  Procedure Laterality Date  . APPLICATION OF WOUND VAC Right 09/30/2018   Procedure: Application Of Wound Vac;  Surgeon: Altamese Melvin, MD;  Location: [facility];  Service: Orthopedics;  Laterality: Right;  . COLONOSCOPY  2010   Removed 2 polyps of unclear type.  Not performed in [place]  . COLONOSCOPY WITH PROPOFOL N/A 09/17/2019   Procedure: COLONOSCOPY WITH PROPOFOL;  Surgeon: [name], MD;  Location: [facility];  Service: Endoscopy;  Laterality: N/A;  . CORONARY ARTERY BYPASS GRAFT     in his 10s  . ESOPHAGOGASTRODUODENOSCOPY (EGD) WITH PROPOFOL N/A 08/14/2019   Procedure: ESOPHAGOGASTRODUODENOSCOPY (EGD) WITH PROPOFOL;  Surgeon: [name], MD;  Location: [place];  Service: Gastroenterology;  Laterality: N/A;  . ESOPHAGOGASTRODUODENOSCOPY (EGD) WITH PROPOFOL N/A 09/17/2019   Procedure: ESOPHAGOGASTRODUODENOSCOPY (EGD) WITH PROPOFOL;  Surgeon: [name], MD;  Location: [place] [facility] [place];  Service: Endoscopy;  Laterality: N/A;  . GIVENS CAPSULE STUDY N/A 09/17/2019   Procedure: GIVENS CAPSULE STUDY;  Surgeon: [name], MD;  Location: [facility]  ENDOSCOPY;  Service: Endoscopy;  Laterality: N/A;  . HOT HEMOSTASIS N/A 08/14/2019   Procedure: HOT HEMOSTASIS (ARGON PLASMA COAGULATION/BICAP);  Surgeon: [name], MD;  Location: [facility] ENDOSCOPY;  Service: Gastroenterology;  Laterality: N/A;  . HOT HEMOSTASIS N/A 09/17/2019   Procedure: HOT HEMOSTASIS (ARGON PLASMA COAGULATION/BICAP);  Surgeon: [name], MD;  Location: [facility] [place];  Service: Endoscopy;  Laterality: N/A;  . PARTIAL COLECTOMY  2003   To address colon cancer  . PATELLAR TENDON REPAIR Right 09/30/2018    Procedure: PATELLA TENDON REPAIR;  Surgeon: Altamese Loughman, MD;  Location: [place];  Service: Orthopedics;  Laterality: Right;  . POLYPECTOMY  09/17/2019   Procedure: POLYPECTOMY;  Surgeon: [name], MD;  Location: [facility] ENDOSCOPY;  Service: Endoscopy;;       Family History  Problem Relation Age of Onset  . Colon cancer Father   . CAD Other   . Colon cancer Paternal Uncle   . Colon cancer Paternal Uncle   . Diabetes Neg Hx     Social History   Tobacco Use  . Smoking status: Former Smoker    Types: Cigarettes    Quit date: 1990    Years since quitting: 31.2  . Smokeless tobacco: Never Used  Substance Use Topics  . Alcohol use: Yes  . Drug use: Never    Home Medications Prior to Admission medications   Medication Sig Start Date End Date Taking? Authorizing Provider  allopurinol (ZYLOPRIM) 300 MG tablet Take 300 mg by mouth at bedtime.  08/12/18  Yes [provider]  amiodarone (PACERONE) 200 MG tablet Take 200 mg by mouth daily. 09/05/19  Yes [provider]  aspirin EC 81 MG tablet Take 81 mg by mouth daily.   Yes [provider]  cholecalciferol (VITAMIN D3) 25 MCG (1000 UT) tablet Take 2 tablets (2,000 Units total) by mouth 2 (two) times daily. Patient taking differently: Take 2,000 Units by mouth daily.  10/08/18  Yes [name], DO  diltiazem (CARDIZEM CD) 180 MG 24 hr capsule Take 180 mg by mouth daily. 09/05/19  Yes [provider]  ferrous sulfate 325 (65 FE) MG tablet Take 325 mg by mouth daily. 11/15/15 11/15/19 Yes [provider]  finasteride (PROSCAR) 5 MG tablet Take 5 mg by mouth daily. 02/18/18  Yes [provider]  folic acid (FOLVITE) 1 MG tablet Take 1 tablet (1 mg total) by mouth daily. 10/09/18  Yes [name] C, DO  furosemide (LASIX) 20 MG tablet Take 20 mg by mouth daily. 09/05/19  Yes [provider]  magnesium oxide (MAG-OX) 400 (241.3 Mg) MG tablet Take 1 tablet (400 mg total) by mouth  daily. 08/25/18  Yes [name], MD  metFORMIN (GLUCOPHAGE-XR) 500 MG 24 hr tablet Take 500 mg by mouth daily with breakfast.  02/18/18  Yes [provider]  Multiple Vitamin (MULTIVITAMIN WITH MINERALS) TABS tablet Take 1 tablet by mouth daily.   Yes [provider]  pantoprazole (PROTONIX) 40 MG tablet Take 1 pill twice a day for 1 week. Then decrease to 1 pill daily. Patient taking differently: Take 40 mg by mouth daily.  08/16/19  Yes Bonnita Hollow, MD  polyethylene glycol [facility] / GLYCOLAX) packet Take 17 g by mouth 2 (two) times daily. Patient taking differently: Take 17 g by mouth daily as needed for mild constipation. Mix in coffee and drink 10/08/18  Yes [name] C, DO  polyvinyl alcohol (ARTIFICIAL TEARS) 1.4 % ophthalmic solution Place 1 drop  into the left eye 3 (three) times daily.   Yes [provider]  potassium chloride (KLOR-CON) 10 MEQ tablet Take 10 mEq by mouth daily. 09/05/19  Yes [provider]  pravastatin (PRAVACHOL) 10 MG tablet Take 10 mg by mouth at bedtime. 02/18/18  Yes [provider]  senna-docusate (SENOKOT-S) 8.6-50 MG tablet Take 1 tablet by mouth 2 (two) times daily. Patient taking differently: Take 1 tablet by mouth every other day.  10/08/18  Yes [name], DO  spironolactone (ALDACTONE) 50 MG tablet Take 1 tablet (50 mg total) by mouth daily for 30 doses. 09/20/19 10/20/19 Yes Chundi, Vahini, MD  tamsulosin (FLOMAX) 0.4 MG CAPS capsule Take 0.4 mg by mouth at bedtime. 02/18/18  Yes [provider]  thiamine 100 MG tablet Take 1 tablet (100 mg total) by mouth daily. 10/09/18  Yes [name] C, DO  vitamin C (VITAMIN C) 1000 MG tablet Take 1 tablet (1,000 mg total) by mouth daily. 10/09/18  Yes Anderson, Hannah C, DO  XARELTO 20 MG TABS tablet Take 20 mg by mouth daily. 09/05/19  Yes [provider]  diclofenac Sodium (VOLTAREN) 1 % GEL Apply 2 g topically 4 (four) times daily as  needed (pain). Patient not taking: Reported on 10/02/2019 08/16/19   Bonnita Hollow, MD    Allergies    Patient has no known allergies.  Review of Systems   Review of Systems  Constitutional: Positive for activity change and fatigue.  Respiratory: Negative for shortness of breath.   Cardiovascular: Negative for chest pain.  Gastrointestinal: Positive for blood in stool. Negative for nausea and vomiting.  Allergic/Immunologic: Negative for immunocompromised state.  Neurological: Positive for dizziness.  Hematological: Bruises/bleeds easily.  All other systems reviewed and are negative.   Physical Exam Updated Vital Signs BP 136/61   Pulse 71   Temp 98 F (36.7 C) (Oral)   Resp 16   Ht 5\' 6"  (1.676 m)   Wt 136.1 kg   SpO2 100%   BMI 48.42 kg/m   Physical Exam Vitals and nursing note reviewed.  Constitutional:      Appearance: He is well-developed.  HENT:     Head: Atraumatic.  Cardiovascular:     Rate and Rhythm: Normal rate.  Pulmonary:     Effort: Pulmonary effort is normal.  Abdominal:     Tenderness: There is no abdominal tenderness.  Musculoskeletal:     Cervical back: Neck supple.  Skin:    General: Skin is warm.  Neurological:     Mental Status: He is alert and oriented to person, place, and time.     ED Results / Procedures / Treatments   Labs (all labs ordered are listed, but only abnormal results are displayed) Labs Reviewed  COMPREHENSIVE METABOLIC PANEL - Abnormal; Notable for the following components:      Result Value   Sodium 134 (*)    CO2 21 (*)    Glucose, Bld 148 (*)    BUN 29 (*)    Creatinine, Ser 1.33 (*)    Total Protein 6.4 (*)    Albumin 3.4 (*)    GFR calc non Af Amer 53 (*)    All other components within normal limits  CBC WITH DIFFERENTIAL/PLATELET - Abnormal; Notable for the following components:   RBC 2.14 (*)    Hemoglobin 6.1 (*)    HCT 22.0 (*)    MCV 102.8 (*)    MCHC 27.7 (*)    RDW 18.8 (*)  All other  components within normal limits  PROTIME-INR - Abnormal; Notable for the following components:   Prothrombin Time 27.3 (*)    INR 2.5 (*)    All other components within normal limits  RESPIRATORY PANEL BY RT PCR (FLU A&B, COVID)  TYPE AND SCREEN  ABO/RH  PREPARE RBC (CROSSMATCH)    EKG None  Radiology DG Chest Port 1 View  Result Date: 10/02/2019 CLINICAL DATA:  Shortness of breath. Additional history provided: Dark tarry stool for 1 day, fatigue, shortness of breath EXAM: PORTABLE CHEST 1 VIEW COMPARISON:  Chest radiograph 09/16/2019 FINDINGS: Prior median sternotomy and CABG. Unchanged cardiomegaly. Aortic atherosclerosis. No evidence of airspace consolidation within the lungs. Minimal left basilar atelectasis. No evidence of pleural effusion or pneumothorax. No acute bony abnormality. Thoracic spondylosis. IMPRESSION: Minimal left basilar atelectasis. Otherwise, no evidence of acute cardiopulmonary abnormality. Unchanged cardiomegaly.  Aortic atherosclerosis. Electronically Signed   By: Kellie Simmering DO   On: 10/02/2019 19:18    Procedures .Critical Care Performed by: Varney Biles, MD Authorized by: Varney Biles, MD   Critical care provider statement:    Critical care time (minutes):  40   Critical care was necessary to treat or prevent imminent or life-threatening deterioration of the following conditions: Symptomatic anemia, acute.   Critical care was time spent personally by me on the following activities:  Discussions with consultants, evaluation of patient's response to treatment, examination of patient, ordering and performing treatments and interventions, ordering and review of laboratory studies, ordering and review of radiographic studies, pulse oximetry, re-evaluation of patient's condition, obtaining history from patient or surrogate and review of old charts   (including critical care time)  Medications Ordered in ED Medications  0.9 %  sodium chloride infusion  (has no administration in time range)  pantoprazole (PROTONIX) injection 80 mg (80 mg Intravenous Given 10/02/19 1824)    ED Course  I have reviewed the triage vital signs and the nursing notes.  Pertinent labs & imaging results that were available during my care of the patient were reviewed by me and considered in my medical decision making (see chart for details).    MDM Rules/Calculators/A&P                      74 year old comes in a chief complaint of bloody stools, dizziness and weakness.  Patient has history of advanced liver disease and also gastric AVMs.  Recently he was admitted to the hospital for GI bleed, scopes at that time confirmed gastropathy and AVMs, however patient was not noted to have any varices.  He is on Xarelto for A. Fib.  Patient is complaining of dark tarry stools that started today.  He is having some dizziness and weakness as well.  Hemodynamically he is stable besides some mild tachycardia.   Labs reveal acute anemia.  Blood transfusion will be initiated.  I discussed the case with GI, and their team will see the patient tomorrow.  The request of the patient be kept n.p.o.  Results of the ED work-up discussed with the patient.  He is consented for transfusion.   Final Clinical Impression(s) / ED Diagnoses Final diagnoses:  Symptomatic anemia  Acute upper GI bleed    Rx / DC Orders ED Discharge Orders    None       Varney Biles, MD 10/04/19 1821

## 2019-10-03 DIAGNOSIS — Z7901 Long term (current) use of anticoagulants: Secondary | ICD-10-CM

## 2019-10-03 DIAGNOSIS — D62 Acute posthemorrhagic anemia: Secondary | ICD-10-CM

## 2019-10-03 LAB — COMPREHENSIVE METABOLIC PANEL
ALT: 17 U/L (ref 0–44)
AST: 15 U/L (ref 15–41)
Albumin: 3 g/dL — ABNORMAL LOW (ref 3.5–5.0)
Alkaline Phosphatase: 77 U/L (ref 38–126)
Anion gap: 6 (ref 5–15)
BUN: 23 mg/dL (ref 8–23)
CO2: 24 mmol/L (ref 22–32)
Calcium: 8.9 mg/dL (ref 8.9–10.3)
Chloride: 104 mmol/L (ref 98–111)
Creatinine, Ser: 1.18 mg/dL (ref 0.61–1.24)
GFR calc Af Amer: >60 mL/min (ref >60)
GFR calc non Af Amer: >60 mL/min (ref >60)
Glucose, Bld: 109 mg/dL — ABNORMAL HIGH (ref 70–99)
Potassium: 4.3 mmol/L (ref 3.5–5.1)
Sodium: 134 mmol/L — ABNORMAL LOW (ref 135–145)
Total Bilirubin: 0.6 mg/dL (ref 0.3–1.2)
Total Protein: 5.7 g/dL — ABNORMAL LOW (ref 6.5–8.1)

## 2019-10-03 LAB — GLUCOSE, CAPILLARY
Glucose-Capillary: 107 mg/dL — ABNORMAL HIGH (ref 70–99)
Glucose-Capillary: 108 mg/dL — ABNORMAL HIGH (ref 70–99)
Glucose-Capillary: 111 mg/dL — ABNORMAL HIGH (ref 70–99)
Glucose-Capillary: 112 mg/dL — ABNORMAL HIGH (ref 70–99)
Glucose-Capillary: 113 mg/dL — ABNORMAL HIGH (ref 70–99)
Glucose-Capillary: 120 mg/dL — ABNORMAL HIGH (ref 70–99)
Glucose-Capillary: 153 mg/dL — ABNORMAL HIGH (ref 70–99)

## 2019-10-03 LAB — CBC
HCT: 23.5 % — ABNORMAL LOW (ref 39.0–52.0)
Hemoglobin: 6.9 g/dL — CL (ref 13.0–17.0)
MCH: 28.8 pg (ref 26.0–34.0)
MCHC: 29.4 g/dL — ABNORMAL LOW (ref 30.0–36.0)
MCV: 97.9 fL (ref 80.0–100.0)
Platelets: 221 10*3/uL (ref 150–400)
RBC: 2.4 MIL/uL — ABNORMAL LOW (ref 4.22–5.81)
RDW: 18.7 % — ABNORMAL HIGH (ref 11.5–15.5)
WBC: 6.5 10*3/uL (ref 4.0–10.5)
nRBC: 0 % (ref 0.0–0.2)

## 2019-10-03 LAB — PREPARE RBC (CROSSMATCH)

## 2019-10-03 LAB — PROTIME-INR
INR: 1.6 — ABNORMAL HIGH (ref 0.8–1.2)
Prothrombin Time: 18.9 seconds — ABNORMAL HIGH (ref 11.4–15.2)

## 2019-10-03 LAB — ABO/RH: ABO/RH(D): B POS

## 2019-10-03 MED ORDER — SODIUM CHLORIDE 0.9% IV SOLUTION: Freq: Once | INTRAVENOUS | Status: AC

## 2019-10-03 NOTE — H&P (View-Only) (Signed)
Consultation  Referring Provider: [facility] hospitalist/[place] primary Care Physician:  Helane Rima, MD Primary Gastroenterologist:  Dr.Pyrtle  Reason for Consultation: Recurrent melena and anemia  HPI: Banx Schacht is a 74 y.o. male, known to Dr. 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  He has history of acute on chronic anemia, relatively new diagnosis of cirrhosis, history of EtOH abuse/inactive since 09/2018.  He also has history of colon cancer status post resection in 1999, has atrial fibrillation for which he is on Xarelto, morbid obesity, adult onset diabetes mellitus, and asymptomatic cholelithiasis. Patient presented to the emergency room yesterday with complaints of weakness and had had a grossly melenic stool yesterday afternoon.  He did take his Xarelto yesterday. Work-up in the emergency room revealed hemoglobin 6.1/hematocrit of 22 and INR of 2.5 (INR was 1.7 on 09/16/2019.)  He has been hemodynamically stable, has not had any further melena since admission and denies any abdominal discomfort.  He is just completing his second unit of packed RBCs and labs are pending this morning.  Patient was hospitalized in January 2021 with melena and had EGD which showed a bleeding AVM in the gastric curvature which was treated with APC, there was no evidence of esophageal varices or portal gastropathy.  CT at that time showed a stable 5.9 cm ill-defined hepatic mass and a small amount of ascites as well as splenomegaly.  He was readmitted in mid February with recurrent anemia.  He had EGD colonoscopy and capsule.  EGD revealed a another single gastric AVM treated with BiCAP, normal-appearing esophagus and duodenum.  Colonoscopy to the terminal ileum showed an ileocolonic anastomosis in the ascending colon, diverticulosis and 2 polyps were removed.  Capsule endoscopy showed a scant amount  of heme in the stomach and was otherwise unremarkable.  Patient had been doing well, until yesterday.  He had resumed Xarelto on discharge.  He had also been on diuretics.   Past Medical History:  Diagnosis Date  . Anemia 07/2019   Hb 6.7 >> 3 PRBCs >> 8.8 in 07/2019.     Marland Kitchen Atrial fibrillation ([place])   . Cholelithiasis   . Cirrhosis of liver ([place])    Presumed due to EtOH as well as fatty liver from morbid obesity.  Cirrhosis evident on CT scan 07/2018 but finding overlooked and formal diagnoses not made until 07/2019  . Colon cancer ([place]) 1999   Partial colectomy  . Coronary artery disease   . Diabetes mellitus without complication ([place])   . Diverticulosis   . Esophageal varices ([place])   . Gastric AVM   . 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 Obesity 07/2019  . Portal hypertension ([place])   . Tubular adenoma of colon     Past Surgical History:  Procedure Laterality Date  . APPLICATION OF WOUND VAC Right 09/30/2018   Procedure: Application Of Wound Vac;  Surgeon: Altamese Redford, MD;  Location: [place];  Service: Orthopedics;  Laterality: Right;  . COLONOSCOPY  2010   Removed 2 polyps of unclear type.  Not performed in [place]  . COLONOSCOPY WITH PROPOFOL N/A 09/17/2019   Procedure: COLONOSCOPY WITH PROPOFOL;  Surgeon: [name], MD;  Location: [place];  Service: Endoscopy;  Laterality: N/A;  . CORONARY ARTERY BYPASS GRAFT     in his 60s  . ESOPHAGOGASTRODUODENOSCOPY (EGD) WITH PROPOFOL N/A 08/14/2019   Procedure: ESOPHAGOGASTRODUODENOSCOPY (EGD)  WITH PROPOFOL;  Surgeon: [name], MD;  Location[facility] ENDOSCOPY;  Service: Gastroenterology;  Laterality: N/A;  . ESOPHAGOGASTRODUODENOSCOPY (EGD) WITH PROPOFOL N/A 09/17/2019   Procedure: ESOPHAGOGASTRODUODENOSCOPY (EGD) WITH PROPOFOL;  Surgeon: [name], MD;  Location: [place];  Service: Endoscopy;  Laterality: N/A;  . GIVENS CAPSULE STUDY N/A 09/17/2019   Procedure: GIVENS CAPSULE STUDY;   Surgeon: [name], MD;  Location: [place];  Service: Endoscopy;  Laterality: N/A;  . HOT HEMOSTASIS N/A 08/14/2019   Procedure: HOT HEMOSTASIS (ARGON PLASMA COAGULATION/BICAP);  Surgeon: [name], MD;  Location[facility] ENDOSCOPY;  Service: Gastroenterology;  Laterality: N/A;  . HOT HEMOSTASIS N/A 09/17/2019   Procedure: HOT HEMOSTASIS (ARGON PLASMA COAGULATION/BICAP);  Surgeon: [name], MD;  Location: [place];  Service: Endoscopy;  Laterality: N/A;  . PARTIAL COLECTOMY  2003   To address colon cancer  . PATELLAR TENDON REPAIR Right 09/30/2018   Procedure: PATELLA TENDON REPAIR;  Surgeon: Altamese Mount Ayr, MD;  Location: [place];  Service: Orthopedics;  Laterality: Right;  . POLYPECTOMY  09/17/2019   Procedure: POLYPECTOMY;  Surgeon: [name], MD;  Location: [facility] ENDOSCOPY;  Service: Endoscopy;;    Prior to Admission medications   Medication Sig Start Date End Date Taking? Authorizing Provider  allopurinol (ZYLOPRIM) 300 MG tablet Take 300 mg by mouth at bedtime.  08/12/18  Yes [provider]  amiodarone (PACERONE) 200 MG tablet Take 200 mg by mouth daily. 09/05/19  Yes [provider]  aspirin EC 81 MG tablet Take 81 mg by mouth daily.   Yes [provider]  cholecalciferol (VITAMIN D3) 25 MCG (1000 UT) tablet Take 2 tablets (2,000 Units total) by mouth 2 (two) times daily. Patient taking differently: Take 2,000 Units by mouth daily.  10/08/18  Yes [name], DO  diltiazem (CARDIZEM CD) 180 MG 24 hr capsule Take 180 mg by mouth daily. 09/05/19  Yes [provider]  ferrous sulfate 325 (65 FE) MG tablet Take 325 mg by mouth daily. 11/15/15 11/15/19 Yes [provider]  finasteride (PROSCAR) 5 MG tablet Take 5 mg by mouth daily. 02/18/18  Yes [provider]  folic acid (FOLVITE) 1 MG tablet Take 1 tablet (1 mg total) by mouth daily. 10/09/18  Yes [name] C, DO  furosemide (LASIX) 20 MG tablet Take 20 mg by mouth  daily. 09/05/19  Yes [provider]  magnesium oxide (MAG-OX) 400 (241.3 Mg) MG tablet Take 1 tablet (400 mg total) by mouth daily. 08/25/18  Yes [name], MD  metFORMIN (GLUCOPHAGE-XR) 500 MG 24 hr tablet Take 500 mg by mouth daily with breakfast.  02/18/18  Yes [provider]  Multiple Vitamin (MULTIVITAMIN WITH MINERALS) TABS tablet Take 1 tablet by mouth daily.   Yes [provider]  pantoprazole (PROTONIX) 40 MG tablet Take 1 pill twice a day for 1 week. Then decrease to 1 pill daily. Patient taking differently: Take 40 mg by mouth daily.  08/16/19  Yes Bonnita Hollow, MD  polyethylene glycol [facility] / GLYCOLAX) packet Take 17 g by mouth 2 (two) times daily. Patient taking differently: Take 17 g by mouth daily as needed for mild constipation. Mix in coffee and drink 10/08/18  Yes [name] C, DO  polyvinyl alcohol (ARTIFICIAL TEARS) 1.4 % ophthalmic solution Place 1 drop into the left eye 3 (three) times daily.   Yes [provider]  potassium chloride (KLOR-CON) 10 MEQ tablet Take 10 mEq by mouth daily. 09/05/19  Yes [provider]  pravastatin (PRAVACHOL) 10 MG tablet Take 10 mg by mouth at bedtime. 02/18/18  Yes [provider]  senna-docusate (SENOKOT-S) 8.6-50 MG tablet Take 1 tablet by mouth 2 (two) times daily. Patient taking differently: Take 1 tablet by mouth every other day.  10/08/18  Yes [name], DO  spironolactone (ALDACTONE) 50 MG tablet Take 1 tablet (50 mg total) by mouth daily for 30 doses. 09/20/19 10/20/19 Yes Chundi, Vahini, MD  tamsulosin (FLOMAX) 0.4 MG CAPS capsule Take 0.4 mg by mouth at bedtime. 02/18/18  Yes [provider]  thiamine 100 MG tablet Take 1 tablet (100 mg total) by mouth daily. 10/09/18  Yes [name] C, DO  vitamin C (VITAMIN C) 1000 MG tablet Take 1 tablet (1,000 mg total) by mouth daily. 10/09/18  Yes Anderson, Hannah C, DO  XARELTO 20 MG TABS tablet Take 20 mg by  mouth daily. 09/05/19  Yes [provider]  diclofenac Sodium (VOLTAREN) 1 % GEL Apply 2 g topically 4 (four) times daily as needed (pain). Patient not taking: Reported on 10/02/2019 08/16/19   Bonnita Hollow, MD    Current Facility-Administered Medications  Medication Dose Route Frequency Provider Last Rate Last Admin  . 0.9 %  sodium chloride infusion  10 mL/hr Intravenous Once Bennie Pierini, MD      . allopurinol (ZYLOPRIM) tablet 300 mg  300 mg Oral QHS Bennie Pierini, MD   300 mg at 10/02/19 2254  . amiodarone (PACERONE) tablet 200 mg  200 mg Oral Daily [place], Michele Mcalpine, MD      . cholecalciferol (VITAMIN D3) tablet 2,000 Units  2,000 Units Oral Daily Bennie Pierini, MD      . finasteride (PROSCAR) tablet 5 mg  5 mg Oral Daily Bennie Pierini, MD      . folic acid (FOLVITE) tablet 1 mg  1 mg Oral Daily Schertz, Michele Mcalpine, MD      . insulin aspart (novoLOG) injection 0-15 Units  0-15 Units Subcutaneous Q4H Schertz, Michele Mcalpine, MD      . multivitamin with minerals tablet 1 tablet  1 tablet Oral Daily Bennie Pierini, MD      . ondansetron [facility]) tablet 4 mg  4 mg Oral Q6H PRN Bennie Pierini, MD       Or  . ondansetron [facility]) injection 4 mg  4 mg Intravenous Q6H PRN Bennie Pierini, MD      . pantoprazole (PROTONIX) injection 40 mg  40 mg Intravenous Q12H Bennie Pierini, MD      . pravastatin (PRAVACHOL) tablet 10 mg  10 mg Oral QHS Bennie Pierini, MD   10 mg at 10/02/19 2254  . tamsulosin (FLOMAX) capsule 0.4 mg  0.4 mg Oral QHS Bennie Pierini, MD   0.4 mg at 10/02/19 2254  . thiamine tablet 100 mg  100 mg Oral Daily Bennie Pierini, MD        Allergies as of 10/02/2019  . (No Known Allergies)    Family History  Problem Relation Age of Onset  . Colon cancer Father   . CAD Other   . Colon cancer Paternal Uncle   . Colon cancer Paternal Uncle   . Diabetes Neg Hx     Social History   Socioeconomic History  . Marital status:  Widowed    Spouse name: Not on file  . Number of children: Not on file  . Years of education: Not on  file  . Highest education level: Not on file  Occupational History  . Not on file  Tobacco Use  . Smoking status: Former Smoker    Types: Cigarettes    Quit date: 1990    Years since quitting: 31.2  . Smokeless tobacco: Never Used  Substance and Sexual Activity  . Alcohol use: Yes  . Drug use: Never  . Sexual activity: Not Currently  Other Topics Concern  . Not on file  Social History Narrative  . Not on file   Social Determinants of Health   Financial Resource Strain:   . Difficulty of Paying Living Expenses: Not on file  Food Insecurity:   . Worried About Charity fundraiser in the Last Year: Not on file  . Ran Out of Food in the Last Year: Not on file  Transportation Needs:   . Lack of Transportation (Medical): Not on file  . Lack of Transportation (Non-Medical): Not on file  Physical Activity:   . Days of Exercise per Week: Not on file  . Minutes of Exercise per Session: Not on file  Stress:   . Feeling of Stress : Not on file  Social Connections:   . Frequency of Communication with Friends and Family: Not on file  . Frequency of Social Gatherings with Friends and Family: Not on file  . Attends Religious Services: Not on file  . Active Member of Clubs or Organizations: Not on file  . Attends Archivist Meetings: Not on file  . Marital Status: Not on file  Intimate Partner Violence:   . Fear of Current or Ex-Partner: Not on file  . Emotionally Abused: Not on file  . Physically Abused: Not on file  . Sexually Abused: Not on file    Review of Systems: Pertinent positive and negative review of systems were noted in the above HPI section.  All other review of systems was otherwise negative.  Physical Exam: Vital signs in last 24 hours: Temp:  [97.5 F (36.4 C)-98.5 F (36.9 C)] 98.2 F (36.8 C) (03/08 0602) Pulse Rate:  [71-87] 82 (03/08  0602) Resp:  [15-19] 16 (03/08 0602) BP: (117-169)/(45-70) 122/59 (03/08 0602) SpO2:  [97 %-100 %] 98 % (03/08 0602) Weight:  [129.1 kg-136.1 kg] 132.3 kg (03/08 0344) Last BM Date: 10/02/19 General:   Alert,  Well-developed, well-nourished, elderly white male pleasant and cooperative in NAD.  Morbidly obese Head:  Normocephalic and atraumatic. Eyes:  Sclera clear, no icterus.   Conjunctiva pale Ears:  Normal auditory acuity. Nose:  No deformity, discharge,  or lesions. Mouth:  No deformity or lesions.   Neck:  Supple; no masses or thyromegaly. Lungs:  Clear throughout to auscultation, decreased breath sounds at the bases no wheezes, crackles, or rhonchi. Heart:  Regular rate and rhythm; no murmurs, clicks, rubs,  or gallops. Abdomen:  Soft, morbidly obese,nontender, BS active,nonpalp mass or hsm, midline incisional scar and large ventral hernia soft and reducible Rectal:  Deferred  Msk:  Symmetrical without gross deformities. . Pulses:  Normal pulses noted. Extremities:  Without clubbing or edema. Neurologic:  Alert and  oriented x4;  grossly normal neurologically. Skin:  Intact without significant lesions or rashes.. Psych:  Alert and cooperative. Normal mood and affect.  Intake/Output from previous day: 03/07 0701 - 03/08 0700 In: 294 [Blood:294] Out: 650 [Urine:650] Intake/Output this shift: No intake/output data recorded.  Lab Results: Recent Labs    10/02/19 1740  WBC 8.5  HGB 6.1*  HCT 22.0*  PLT 284   BMET Recent Labs    10/02/19 1740  NA 134*  K 4.8  CL 103  CO2 21*  GLUCOSE 148*  BUN 29*  CREATININE 1.33*  CALCIUM 9.3   LFT Recent Labs    10/02/19 1740  PROT 6.4*  ALBUMIN 3.4*  AST 23  ALT 22  ALKPHOS 92  BILITOT 0.3   PT/INR Recent Labs    10/02/19 1740  LABPROT 27.3*  INR 2.5*   Hepatitis Panel No results for input(s): HEPBSAG, HCVAB, HEPAIGM, HEPBIGM in the last 72 hours.       IMPRESSION:  #85 74 year old white male with  multiple comorbidities, admitted with recurrent melena and severe anemia.  Patient has had 2 recent hospitalizations with similar presentation and found on EGD x2 to have gastric AVM as source for blood loss which has been treated with APC x1 and bipolar probe x1. Patient underwent EGD colonoscopy and capsule endoscopy during his last admission with no other findings to explain GI blood loss.  No evidence of esophageal varices or portal gastropathy.  Suspect recurrent upper GI bleed secondary to AVMs in setting of chronic anticoagulation  #2 decompensated cirrhosis felt likely on the basis of EtOH.  Patient has been abstinent over the past year #3 coagulopathy-multifactoral with use of Xarelto and underlying cirrhosis  #4   5.9 cm hepatic mass-MRI has just been ordered as an outpatient for further evaluation, rule out [facility]  # 5 personal history of colon cancer, remote status post resection-2 small adenomatous polyps at colonoscopy February 2021 #6 morbid obesity #7.  Chronic kidney disease #8.  Atrial fibrillation-on Xarelto # 9 adult onset diabetes mellitus   PLAN: Full liquid diet today, n.p.o. after midnight Patient has been scheduled for EGD with Dr. Silverio Decamp in a.m. tomorrow. Procedure discussed in detail with the patient including indications risks and benefits and he is agreeable to proceed. Hold Xarelto Await INR this morning-may need vitamin K Continue serial hemoglobins and transfuse for hemoglobin 7 or less Continue IV PPI May want to proceed with MRI of the liver during this admission.  Thank you we will follow with you    Amy EsterwoodPA-C  10/03/2019, 8:56 AM   Attending physician's note   I have taken a history, examined the patient and reviewed the chart. I agree with the Advanced Practitioner's note, impression and recommendations.  74 year old male with morbid obesity, Karlene Lineman and EtOH cirrhosis, chronic A. fib on chronic anticoagulation with Xarelto admitted with  recurrent melena and worsening anemia  Recent hospitalization with acute GI bleed from gastric AVM Plan for EGD tomorrow with therapeutic intervention as needed for evaluation of upper GI bleed Differential includes gastric/small bowel AVM.  Will also need to exclude gastroduodenal ulcer or varices though less likely given none on recent EGD  Transfuse to hemoglobin 7 Recheck PT/INR tomorrow a.m. N.p.o. after midnight Continue to hold Xarelto  5.9 cm ill-defined hepatic mass suggestive of possible hemangioma.   Check AFP and MRI for further evaluation and exclude hepatocellular carcinoma  Available if have any questions  K. Denzil Magnuson , MD [phone]

## 2019-10-03 NOTE — Progress Notes (Signed)
PROGRESS NOTE    Brandon [name]  [id number] DOB: Sep 30, 1945 DOA: 10/02/2019 PCP: Helane Rima, MD    Brief Narrative:  Patient is 74 year old gentleman with history of hepatic cirrhosis and portal hypertension, gastric AVM, previous upper GI bleeding and recent endoscopy evaluation x2, colon cancer status post partial colectomy, A. fib on Xarelto and type 2 diabetes as well morbid obesity presented to the emergency room with 1 day of weakness and shortness of breath, large black bowel movement.  No abdominal pain.  Patient is on Protonix.  He is on Xarelto since diagnosis of A. fib 2 months ago.  Most recent hospitalization 2/19-2/22 with a BM. In the emergency room hemodynamically stable.  Hemoglobin 6.1.  INR 2.5.  Admitted with acute GI bleeding.   Assessment & Plan:   Active Problems:   Melena  Acute on chronic anemia due to GI blood loss, symptomatic anemia with melena: 2 units of PRBC overnight, hemoglobin 6.9, 1 more unit to be transfused today. Patient on Protonix.  No history of varices. Seen by GI, scheduled for upper GI endoscopy tomorrow morning. Xarelto on hold. Given 1 dose of vitamin K. We will continue to need close monitoring and replacement, transfusion for hemoglobin less than 7.  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  Abstinence for more than 1 year now.  Holding Lasix and aspirin lactone in the setting of upper GI bleeding.  On multivitamins.  Liver test are fairly normal.  Paroxysmal A. fib: Currently sinus rhythm on amiodarone.  Holding Xarelto.  Third episode of bleeding in last 2 months requiring multiple blood transfusions.  Patient does not want to go back on anticoagulation.  Type 2 diabetes: On oral hypoglycemics at home.  Currently on sliding scale insulin.  Coronary artery disease status post CABG: Fairly stable at this time.  Transfusion to keep adequate hemoglobin.   DVT prophylaxis: SCDs Code Status: DNR Family Communication:  None Disposition Plan: patient is from home. Anticipated DC to home, Barriers to discharge severe anemia requiring transfusions, plan for inpatient procedures   Consultants:   GI  Procedures:   None  Antimicrobials:   None   Subjective: Patient seen and examined in the morning rounds.  No overnight events.  Objective: Vitals:   10/03/19 0537 10/03/19 0602 10/03/19 0856 10/03/19 1226  BP: 117/62 (!) 122/59 118/64 117/61  Pulse: 80 82 70 70  Resp: 16 16 14 15   Temp: 98 F (36.7 C) 98.2 F (36.8 C) 98.2 F (36.8 C) 97.9 F (36.6 C)  TempSrc: Oral Oral Oral Oral  SpO2: 98% 98% 99% 100%  Weight:      Height:        Intake/Output Summary (Last 24 hours) at 10/03/2019 1245 Last data filed at 10/03/2019 1226 Gross per 24 hour  Intake 659 ml  Output 650 ml  Net 9 ml   Filed Weights   10/02/19 1731 10/02/19 2143 10/03/19 0344  Weight: 136.1 kg 129.1 kg 132.3 kg    Examination:  General exam: Appears calm and comfortable, on room air. Respiratory system: Clear to auscultation. Respiratory effort normal.  No added sounds. Cardiovascular system: S1 & S2 heard, RRR. No JVD, murmurs, rubs, gallops or clicks. No pedal edema. Gastrointestinal system: Abdomen is nondistended, soft and nontender.  Obese and pendulous.  Nontender.  Central nervous system: Alert and oriented. No focal neurological deficits. Extremities: Symmetric 5 x 5 power. Skin: No rashes, lesions or ulcers Psychiatry: Judgement and insight appear normal. Mood & affect appropriate.  Data Reviewed: I have personally reviewed following labs and imaging studies  CBC: Recent Labs  Lab 10/02/19 1740 10/03/19 1054  WBC 8.5 6.5  NEUTROABS 6.5  --   HGB 6.1* 6.9*  HCT 22.0* 23.5*  MCV 102.8* 97.9  PLT 284 [id number]   Basic Metabolic Panel: Recent Labs  Lab 10/02/19 1740 10/03/19 1054  NA 134* 134*  K 4.8 4.3  CL 103 104  CO2 21* 24  GLUCOSE 148* 109*  BUN 29* 23  CREATININE 1.33* 1.18  CALCIUM  9.3 8.9  MG 2.0  --   PHOS 4.2  --    GFR: Estimated Creatinine Clearance: 73 mL/min (by C-G formula based on SCr of 1.18 mg/dL). Liver Function Tests: Recent Labs  Lab 10/02/19 1740 10/03/19 1054  AST 23 15  ALT 22 17  ALKPHOS 92 77  BILITOT 0.3 0.6  PROT 6.4* 5.7*  ALBUMIN 3.4* 3.0*   No results for input(s): LIPASE, AMYLASE in the last 168 hours. No results for input(s): AMMONIA in the last 168 hours. Coagulation Profile: Recent Labs  Lab 10/02/19 1740 10/03/19 1054  INR 2.5* 1.6*   Cardiac Enzymes: No results for input(s): CKTOTAL, CKMB, CKMBINDEX, TROPONINI in the last 168 hours. BNP (last 3 results) No results for input(s): PROBNP in the last 8760 hours. HbA1C: No results for input(s): HGBA1C in the last 72 hours. CBG: Recent Labs  Lab 10/02/19 2155 10/03/19 0011 10/03/19 0353 10/03/19 0740 10/03/19 1147  GLUCAP 114* 112* 111* 108* 153*   Lipid Profile: No results for input(s): CHOL, HDL, LDLCALC, TRIG, CHOLHDL, LDLDIRECT in the last 72 hours. Thyroid Function Tests: No results for input(s): TSH, T4TOTAL, FREET4, T3FREE, THYROIDAB in the last 72 hours. Anemia Panel: No results for input(s): VITAMINB12, FOLATE, FERRITIN, TIBC, IRON, RETICCTPCT in the last 72 hours. Sepsis Labs: No results for input(s): PROCALCITON, LATICACIDVEN in the last 168 hours.  Recent Results (from the past 240 hour(s))  Respiratory Panel by RT PCR (Flu A&B, Covid) - Nasopharyngeal Swab     Status: None   Collection Time: 10/02/19  6:06 PM   Specimen: Nasopharyngeal Swab  Result Value Ref Range Status   SARS Coronavirus 2 by RT PCR NEGATIVE NEGATIVE Final    Comment: (NOTE) SARS-CoV-2 target nucleic acids are NOT DETECTED. The SARS-CoV-2 RNA is generally detectable in upper respiratoy specimens during the acute phase of infection. The lowest concentration of SARS-CoV-2 viral copies this assay can detect is 131 copies/mL. A negative result does not preclude  SARS-Cov-2 infection and should not be used as the sole basis for treatment or other patient management decisions. A negative result may occur with  improper specimen collection/handling, submission of specimen other than nasopharyngeal swab, presence of viral mutation(s) within the areas targeted by this assay, and inadequate number of viral copies (<131 copies/mL). A negative result must be combined with clinical observations, patient history, and epidemiological information. The expected result is Negative. Fact Sheet for Patients:  PinkCheek.be Fact Sheet for Healthcare Providers:  GravelBags.it This test is not yet ap proved or cleared by the [place] FDA and  has been authorized for detection and/or diagnosis of SARS-CoV-2 by FDA under an Emergency Use Authorization (EUA). This EUA will remain  in effect (meaning this test can be used) for the duration of the COVID-19 declaration under Section 564(b)(1) of the Act, 21 U.S.C. section 360bbb-3(b)(1), unless the authorization is terminated or revoked sooner.    Influenza A by PCR NEGATIVE NEGATIVE Final   Influenza B  by PCR NEGATIVE NEGATIVE Final    Comment: (NOTE) The Xpert Xpress SARS-CoV-2/FLU/RSV assay is intended as an aid in  the diagnosis of influenza from Nasopharyngeal swab specimens and  should not be used as a sole basis for treatment. Nasal washings and  aspirates are unacceptable for Xpert Xpress SARS-CoV-2/FLU/RSV  testing. Fact Sheet for Patients: PinkCheek.be Fact Sheet for Healthcare Providers: GravelBags.it This test is not yet approved or cleared by the [place] FDA and  has been authorized for detection and/or diagnosis of SARS-CoV-2 by  FDA under an Emergency Use Authorization (EUA). This EUA will remain  in effect (meaning this test can be used) for the duration of the  Covid-19  declaration under Section 564(b)(1) of the Act, 21  U.S.C. section 360bbb-3(b)(1), unless the authorization is  terminated or revoked. Performed at [facility], [place] [address], [place], [place] [address]          Radiology Studies: [facility] Chest Port 1 View  Result Date: 10/02/2019 CLINICAL DATA:  Shortness of breath. Additional history provided: Dark tarry stool for 1 day, fatigue, shortness of breath EXAM: PORTABLE CHEST 1 VIEW COMPARISON:  Chest radiograph 09/16/2019 FINDINGS: Prior median sternotomy and CABG. Unchanged cardiomegaly. Aortic atherosclerosis. No evidence of airspace consolidation within the lungs. Minimal left basilar atelectasis. No evidence of pleural effusion or pneumothorax. No acute bony abnormality. Thoracic spondylosis. IMPRESSION: Minimal left basilar atelectasis. Otherwise, no evidence of acute cardiopulmonary abnormality. Unchanged cardiomegaly.  Aortic atherosclerosis. Electronically Signed   By: Kellie Simmering DO   On: 10/02/2019 19:18        Scheduled Meds: . sodium chloride   Intravenous Once  . allopurinol  300 mg Oral QHS  . amiodarone  200 mg Oral Daily  . cholecalciferol  2,000 Units Oral Daily  . finasteride  5 mg Oral Daily  . folic acid  1 mg Oral Daily  . insulin aspart  0-15 Units Subcutaneous Q4H  . multivitamin with minerals  1 tablet Oral Daily  . pantoprazole  40 mg Intravenous Q12H  . pravastatin  10 mg Oral QHS  . tamsulosin  0.4 mg Oral QHS  . thiamine  100 mg Oral Daily   Continuous Infusions:   LOS: 1 day    Time spent: 25 minutes    Barb Merino, MD [facility] Pager [phone]

## 2019-10-03 NOTE — Progress Notes (Signed)
Pt hbg 6.9 following 2 units of PRBCs. MD notified. Awaiting orders. Will continue to monitor.

## 2019-10-03 NOTE — Consult Note (Addendum)
Consultation  Referring Provider: [facility] hospitalist/Schertz primary Care Physician:  Helane Rima, MD Primary Gastroenterologist:  Dr.Pyrtle  Reason for Consultation: Recurrent melena and anemia  HPI: Brandon [name] is a 74 y.o. male, known to Dr. 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  He has history of acute on chronic anemia, relatively new diagnosis of cirrhosis, history of EtOH abuse/inactive since 09/2018.  He also has history of colon cancer status post resection in 1999, has atrial fibrillation for which he is on Xarelto, morbid obesity, adult onset diabetes mellitus, and asymptomatic cholelithiasis. Patient presented to the emergency room yesterday with complaints of weakness and had had a grossly melenic stool yesterday afternoon.  He did take his Xarelto yesterday. Work-up in the emergency room revealed hemoglobin 6.1/hematocrit of 22 and INR of 2.5 (INR was 1.7 on 09/16/2019.)  He has been hemodynamically stable, has not had any further melena since admission and denies any abdominal discomfort.  He is just completing his second unit of packed RBCs and labs are pending this morning.  Patient was hospitalized in January 2021 with melena and had EGD which showed a bleeding AVM in the gastric curvature which was treated with APC, there was no evidence of esophageal varices or portal gastropathy.  CT at that time showed a stable 5.9 cm ill-defined hepatic mass and a small amount of ascites as well as splenomegaly.  He was readmitted in mid February with recurrent anemia.  He had EGD colonoscopy and capsule.  EGD revealed a another single gastric AVM treated with BiCAP, normal-appearing esophagus and duodenum.  Colonoscopy to the terminal ileum showed an ileocolonic anastomosis in the ascending colon, diverticulosis and 2 polyps were removed.  Capsule endoscopy showed a scant amount  of heme in the stomach and was otherwise unremarkable.  Patient had been doing well, until yesterday.  He had resumed Xarelto on discharge.  He had also been on diuretics.   Past Medical History:  Diagnosis Date  . Anemia 07/2019   Hb 6.7 >> 3 PRBCs >> 8.8 in 07/2019.     Marland Kitchen Atrial fibrillation ([place])   . Cholelithiasis   . Cirrhosis of liver ([place])    Presumed due to EtOH as well as fatty liver from morbid obesity.  Cirrhosis evident on CT scan 07/2018 but finding overlooked and formal diagnoses not made until 07/2019  . Colon cancer ([place]) 1999   Partial colectomy  . Coronary artery disease   . Diabetes mellitus without complication ([place])   . Diverticulosis   . Esophageal varices ([place])   . Gastric AVM   . 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 Obesity 07/2019  . Portal hypertension ([place])   . Tubular adenoma of colon     Past Surgical History:  Procedure Laterality Date  . APPLICATION OF WOUND VAC Right 09/30/2018   Procedure: Application Of Wound Vac;  Surgeon: Altamese Glendora, MD;  Location: [place];  Service: Orthopedics;  Laterality: Right;  . COLONOSCOPY  2010   Removed 2 polyps of unclear type.  Not performed in [place]  . COLONOSCOPY WITH PROPOFOL N/A 09/17/2019   Procedure: COLONOSCOPY WITH PROPOFOL;  Surgeon: [name], MD;  Location: [place];  Service: Endoscopy;  Laterality: N/A;  . CORONARY ARTERY BYPASS GRAFT     in his 34s  . ESOPHAGOGASTRODUODENOSCOPY (EGD) WITH PROPOFOL N/A 08/14/2019   Procedure: ESOPHAGOGASTRODUODENOSCOPY (EGD)  WITH PROPOFOL;  Surgeon: [name], MD;  Location: [facility] ENDOSCOPY;  Service: Gastroenterology;  Laterality: N/A;  . ESOPHAGOGASTRODUODENOSCOPY (EGD) WITH PROPOFOL N/A 09/17/2019   Procedure: ESOPHAGOGASTRODUODENOSCOPY (EGD) WITH PROPOFOL;  Surgeon: [name], MD;  Location: [place];  Service: Endoscopy;  Laterality: N/A;  . GIVENS CAPSULE STUDY N/A 09/17/2019   Procedure: GIVENS CAPSULE STUDY;   Surgeon: [name], MD;  Location: [place];  Service: Endoscopy;  Laterality: N/A;  . HOT HEMOSTASIS N/A 08/14/2019   Procedure: HOT HEMOSTASIS (ARGON PLASMA COAGULATION/BICAP);  Surgeon: [name], MD;  Location: [facility] ENDOSCOPY;  Service: Gastroenterology;  Laterality: N/A;  . HOT HEMOSTASIS N/A 09/17/2019   Procedure: HOT HEMOSTASIS (ARGON PLASMA COAGULATION/BICAP);  Surgeon: [name], MD;  Location: [place];  Service: Endoscopy;  Laterality: N/A;  . PARTIAL COLECTOMY  2003   To address colon cancer  . PATELLAR TENDON REPAIR Right 09/30/2018   Procedure: PATELLA TENDON REPAIR;  Surgeon: Altamese [place], MD;  Location: [place];  Service: Orthopedics;  Laterality: Right;  . POLYPECTOMY  09/17/2019   Procedure: POLYPECTOMY;  Surgeon: [name], MD;  Location: [facility] ENDOSCOPY;  Service: Endoscopy;;    Prior to Admission medications   Medication Sig Start Date End Date Taking? Authorizing Provider  allopurinol (ZYLOPRIM) 300 MG tablet Take 300 mg by mouth at bedtime.  08/12/18  Yes [provider]  amiodarone (PACERONE) 200 MG tablet Take 200 mg by mouth daily. 09/05/19  Yes [provider]  aspirin EC 81 MG tablet Take 81 mg by mouth daily.   Yes [provider]  cholecalciferol (VITAMIN D3) 25 MCG (1000 UT) tablet Take 2 tablets (2,000 Units total) by mouth 2 (two) times daily. Patient taking differently: Take 2,000 Units by mouth daily.  10/08/18  Yes [name], DO  diltiazem (CARDIZEM CD) 180 MG 24 hr capsule Take 180 mg by mouth daily. 09/05/19  Yes [provider]  ferrous sulfate 325 (65 FE) MG tablet Take 325 mg by mouth daily. 11/15/15 11/15/19 Yes [provider]  finasteride (PROSCAR) 5 MG tablet Take 5 mg by mouth daily. 02/18/18  Yes [provider]  folic acid (FOLVITE) 1 MG tablet Take 1 tablet (1 mg total) by mouth daily. 10/09/18  Yes [name] C, DO  furosemide (LASIX) 20 MG tablet Take 20 mg by mouth  daily. 09/05/19  Yes [provider]  magnesium oxide (MAG-OX) 400 (241.3 Mg) MG tablet Take 1 tablet (400 mg total) by mouth daily. 08/25/18  Yes [name], MD  metFORMIN (GLUCOPHAGE-XR) 500 MG 24 hr tablet Take 500 mg by mouth daily with breakfast.  02/18/18  Yes [provider]  Multiple Vitamin (MULTIVITAMIN WITH MINERALS) TABS tablet Take 1 tablet by mouth daily.   Yes [provider]  pantoprazole (PROTONIX) 40 MG tablet Take 1 pill twice a day for 1 week. Then decrease to 1 pill daily. Patient taking differently: Take 40 mg by mouth daily.  08/16/19  Yes Bonnita Hollow, MD  polyethylene glycol [facility] / GLYCOLAX) packet Take 17 g by mouth 2 (two) times daily. Patient taking differently: Take 17 g by mouth daily as needed for mild constipation. Mix in coffee and drink 10/08/18  Yes [name] C, DO  polyvinyl alcohol (ARTIFICIAL TEARS) 1.4 % ophthalmic solution Place 1 drop into the left eye 3 (three) times daily.   Yes [provider]  potassium chloride (KLOR-CON) 10 MEQ tablet Take 10 mEq by mouth daily. 09/05/19  Yes [provider]  pravastatin (PRAVACHOL) 10 MG tablet Take 10 mg by mouth at bedtime. 02/18/18  Yes [provider]  senna-docusate (SENOKOT-S) 8.6-50 MG tablet Take 1 tablet by mouth 2 (two) times daily. Patient taking differently: Take 1 tablet by mouth every other day.  10/08/18  Yes [name], DO  spironolactone (ALDACTONE) 50 MG tablet Take 1 tablet (50 mg total) by mouth daily for 30 doses. 09/20/19 10/20/19 Yes Chundi, Vahini, MD  tamsulosin (FLOMAX) 0.4 MG CAPS capsule Take 0.4 mg by mouth at bedtime. 02/18/18  Yes [provider]  thiamine 100 MG tablet Take 1 tablet (100 mg total) by mouth daily. 10/09/18  Yes [name] C, DO  vitamin C (VITAMIN C) 1000 MG tablet Take 1 tablet (1,000 mg total) by mouth daily. 10/09/18  Yes Anderson, Hannah C, DO  XARELTO 20 MG TABS tablet Take 20 mg by  mouth daily. 09/05/19  Yes [provider]  diclofenac Sodium (VOLTAREN) 1 % GEL Apply 2 g topically 4 (four) times daily as needed (pain). Patient not taking: Reported on 10/02/2019 08/16/19   Bonnita Hollow, MD    Current Facility-Administered Medications  Medication Dose Route Frequency Provider Last Rate Last Admin  . 0.9 %  sodium chloride infusion  10 mL/hr Intravenous Once Bennie Pierini, MD      . allopurinol (ZYLOPRIM) tablet 300 mg  300 mg Oral QHS Bennie Pierini, MD   300 mg at 10/02/19 2254  . amiodarone (PACERONE) tablet 200 mg  200 mg Oral Daily Schertz, Michele Mcalpine, MD      . cholecalciferol (VITAMIN D3) tablet 2,000 Units  2,000 Units Oral Daily Bennie Pierini, MD      . finasteride (PROSCAR) tablet 5 mg  5 mg Oral Daily Bennie Pierini, MD      . folic acid (FOLVITE) tablet 1 mg  1 mg Oral Daily Schertz, Michele Mcalpine, MD      . insulin aspart (novoLOG) injection 0-15 Units  0-15 Units Subcutaneous Q4H Schertz, Michele Mcalpine, MD      . multivitamin with minerals tablet 1 tablet  1 tablet Oral Daily Bennie Pierini, MD      . ondansetron [facility]) tablet 4 mg  4 mg Oral Q6H PRN Bennie Pierini, MD       Or  . ondansetron [facility]) injection 4 mg  4 mg Intravenous Q6H PRN Bennie Pierini, MD      . pantoprazole (PROTONIX) injection 40 mg  40 mg Intravenous Q12H Bennie Pierini, MD      . pravastatin (PRAVACHOL) tablet 10 mg  10 mg Oral QHS Bennie Pierini, MD   10 mg at 10/02/19 2254  . tamsulosin (FLOMAX) capsule 0.4 mg  0.4 mg Oral QHS Bennie Pierini, MD   0.4 mg at 10/02/19 2254  . thiamine tablet 100 mg  100 mg Oral Daily Bennie Pierini, MD        Allergies as of 10/02/2019  . (No Known Allergies)    Family History  Problem Relation Age of Onset  . Colon cancer Father   . CAD Other   . Colon cancer Paternal Uncle   . Colon cancer Paternal Uncle   . Diabetes Neg Hx     Social History   Socioeconomic History  . Marital status:  Widowed    Spouse name: Not on file  . Number of children: Not on file  . Years of education: Not on  file  . Highest education level: Not on file  Occupational History  . Not on file  Tobacco Use  . Smoking status: Former Smoker    Types: Cigarettes    Quit date: 1990    Years since quitting: 31.2  . Smokeless tobacco: Never Used  Substance and Sexual Activity  . Alcohol use: Yes  . Drug use: Never  . Sexual activity: Not Currently  Other Topics Concern  . Not on file  Social History Narrative  . Not on file   Social Determinants of Health   Financial Resource Strain:   . Difficulty of Paying Living Expenses: Not on file  Food Insecurity:   . Worried About Charity fundraiser in the Last Year: Not on file  . Ran Out of Food in the Last Year: Not on file  Transportation Needs:   . Lack of Transportation (Medical): Not on file  . Lack of Transportation (Non-Medical): Not on file  Physical Activity:   . Days of Exercise per Week: Not on file  . Minutes of Exercise per Session: Not on file  Stress:   . Feeling of Stress : Not on file  Social Connections:   . Frequency of Communication with Friends and Family: Not on file  . Frequency of Social Gatherings with Friends and Family: Not on file  . Attends Religious Services: Not on file  . Active Member of Clubs or Organizations: Not on file  . Attends Archivist Meetings: Not on file  . Marital Status: Not on file  Intimate Partner Violence:   . Fear of Current or Ex-Partner: Not on file  . Emotionally Abused: Not on file  . Physically Abused: Not on file  . Sexually Abused: Not on file    Review of Systems: Pertinent positive and negative review of systems were noted in the above HPI section.  All other review of systems was otherwise negative.  Physical Exam: Vital signs in last 24 hours: Temp:  [97.5 F (36.4 C)-98.5 F (36.9 C)] 98.2 F (36.8 C) (03/08 0602) Pulse Rate:  [71-87] 82 (03/08 0602)  Resp:  [15-19] 16 (03/08 0602) BP: (117-169)/(45-70) 122/59 (03/08 0602) SpO2:  [97 %-100 %] 98 % (03/08 0602) Weight:  [129.1 kg-136.1 kg] 132.3 kg (03/08 0344) Last BM Date: 10/02/19 General:   Alert,  Well-developed, well-nourished, elderly white male pleasant and cooperative in NAD.  Morbidly obese Head:  Normocephalic and atraumatic. Eyes:  Sclera clear, no icterus.   Conjunctiva pale Ears:  Normal auditory acuity. Nose:  No deformity, discharge,  or lesions. Mouth:  No deformity or lesions.   Neck:  Supple; no masses or thyromegaly. Lungs:  Clear throughout to auscultation, decreased breath sounds at the bases no wheezes, crackles, or rhonchi. Heart:  Regular rate and rhythm; no murmurs, clicks, rubs,  or gallops. Abdomen:  Soft, morbidly obese,nontender, BS active,nonpalp mass or hsm, midline incisional scar and large ventral hernia soft and reducible Rectal:  Deferred  Msk:  Symmetrical without gross deformities. . Pulses:  Normal pulses noted. Extremities:  Without clubbing or edema. Neurologic:  Alert and  oriented x4;  grossly normal neurologically. Skin:  Intact without significant lesions or rashes.. Psych:  Alert and cooperative. Normal mood and affect.  Intake/Output from previous day: 03/07 0701 - 03/08 0700 In: 294 [Blood:294] Out: 650 [Urine:650] Intake/Output this shift: No intake/output data recorded.  Lab Results: Recent Labs    10/02/19 1740  WBC 8.5  HGB 6.1*  HCT 22.0*  PLT 284   BMET Recent Labs    10/02/19 1740  NA 134*  K 4.8  CL 103  CO2 21*  GLUCOSE 148*  BUN 29*  CREATININE 1.33*  CALCIUM 9.3   LFT Recent Labs    10/02/19 1740  PROT 6.4*  ALBUMIN 3.4*  AST 23  ALT 22  ALKPHOS 92  BILITOT 0.3   PT/INR Recent Labs    10/02/19 1740  LABPROT 27.3*  INR 2.5*   Hepatitis Panel No results for input(s): HEPBSAG, HCVAB, HEPAIGM, HEPBIGM in the last 72 hours.       IMPRESSION:  #36 74 year old white male with multiple  comorbidities, admitted with recurrent melena and severe anemia.  Patient has had 2 recent hospitalizations with similar presentation and found on EGD x2 to have gastric AVM as source for blood loss which has been treated with APC x1 and bipolar probe x1. Patient underwent EGD colonoscopy and capsule endoscopy during his last admission with no other findings to explain GI blood loss.  No evidence of esophageal varices or portal gastropathy.  Suspect recurrent upper GI bleed secondary to AVMs in setting of chronic anticoagulation  #2 decompensated cirrhosis felt likely on the basis of EtOH.  Patient has been abstinent over the past year #3 coagulopathy-multifactoral with use of Xarelto and underlying cirrhosis  #4   5.9 cm hepatic mass-MRI has just been ordered as an outpatient for further evaluation, rule out [facility]  # 5 personal history of colon cancer, remote status post resection-2 small adenomatous polyps at colonoscopy February 2021 #6 morbid obesity #7.  Chronic kidney disease #8.  Atrial fibrillation-on Xarelto # 9 adult onset diabetes mellitus   PLAN: Full liquid diet today, n.p.o. after midnight Patient has been scheduled for EGD with Dr. Silverio Decamp in a.m. tomorrow. Procedure discussed in detail with the patient including indications risks and benefits and he is agreeable to proceed. Hold Xarelto Await INR this morning-may need vitamin K Continue serial hemoglobins and transfuse for hemoglobin 7 or less Continue IV PPI May want to proceed with MRI of the liver during this admission.  Thank you we will follow with you    Amy EsterwoodPA-C  10/03/2019, 8:56 AM   Attending physician's note   I have taken a history, examined the patient and reviewed the chart. I agree with the Advanced Practitioner's note, impression and recommendations.  74 year old male with morbid obesity, Karlene Lineman and EtOH cirrhosis, chronic A. fib on chronic anticoagulation with Xarelto admitted with recurrent  melena and worsening anemia  Recent hospitalization with acute GI bleed from gastric AVM Plan for EGD tomorrow with therapeutic intervention as needed for evaluation of upper GI bleed Differential includes gastric/small bowel AVM.  Will also need to exclude gastroduodenal ulcer or varices though less likely given none on recent EGD  Transfuse to hemoglobin 7 Recheck PT/INR tomorrow a.m. N.p.o. after midnight Continue to hold Xarelto  5.9 cm ill-defined hepatic mass suggestive of possible hemangioma.   Check AFP and MRI for further evaluation and exclude hepatocellular carcinoma  Available if have any questions  K. Denzil Magnuson , MD [phone]

## 2019-10-04 ENCOUNTER — Inpatient Hospital Stay (HOSPITAL_COMMUNITY): Payer: Medicare Other | Admitting: Registered Nurse

## 2019-10-04 ENCOUNTER — Encounter (HOSPITAL_COMMUNITY): Payer: Self-pay | Admitting: Internal Medicine

## 2019-10-04 ENCOUNTER — Encounter (HOSPITAL_COMMUNITY)
Admission: EM | Disposition: A | Discharge status: Home / Self Care | Payer: Self-pay | Source: Home / Self Care | Attending: Internal Medicine

## 2019-10-04 DIAGNOSIS — K922 Gastrointestinal hemorrhage, unspecified: Secondary | ICD-10-CM

## 2019-10-04 DIAGNOSIS — K3189 Other diseases of stomach and duodenum: Secondary | ICD-10-CM

## 2019-10-04 DIAGNOSIS — K766 Portal hypertension: Secondary | ICD-10-CM

## 2019-10-04 HISTORY — PX: ESOPHAGOGASTRODUODENOSCOPY (EGD) WITH PROPOFOL: SHX5813

## 2019-10-04 LAB — CBC WITH DIFFERENTIAL/PLATELET
Abs Immature Granulocytes: 0.03 10*3/uL (ref 0.00–0.07)
Basophils Absolute: 0.1 10*3/uL (ref 0.0–0.1)
Basophils Relative: 1 %
Eosinophils Absolute: 0.5 10*3/uL (ref 0.0–0.5)
Eosinophils Relative: 7 %
HCT: 29.6 % — ABNORMAL LOW (ref 39.0–52.0)
Hemoglobin: 8.7 g/dL — ABNORMAL LOW (ref 13.0–17.0)
Immature Granulocytes: 0 %
Lymphocytes Relative: 11 %
Lymphs Abs: 0.7 10*3/uL (ref 0.7–4.0)
MCH: 28.6 pg (ref 26.0–34.0)
MCHC: 29.4 g/dL — ABNORMAL LOW (ref 30.0–36.0)
MCV: 97.4 fL (ref 80.0–100.0)
Monocytes Absolute: 0.7 10*3/uL (ref 0.1–1.0)
Monocytes Relative: 10 %
Neutro Abs: 4.9 10*3/uL (ref 1.7–7.7)
Neutrophils Relative %: 71 %
Platelets: 224 10*3/uL (ref 150–400)
RBC: 3.04 MIL/uL — ABNORMAL LOW (ref 4.22–5.81)
RDW: 17.8 % — ABNORMAL HIGH (ref 11.5–15.5)
WBC: 6.9 10*3/uL (ref 4.0–10.5)
nRBC: 0 % (ref 0.0–0.2)

## 2019-10-04 LAB — CBC
HCT: 29.4 % — ABNORMAL LOW (ref 39.0–52.0)
Hemoglobin: 8.7 g/dL — ABNORMAL LOW (ref 13.0–17.0)
MCH: 28.3 pg (ref 26.0–34.0)
MCHC: 29.6 g/dL — ABNORMAL LOW (ref 30.0–36.0)
MCV: 95.8 fL (ref 80.0–100.0)
Platelets: 243 10*3/uL (ref 150–400)
RBC: 3.07 MIL/uL — ABNORMAL LOW (ref 4.22–5.81)
RDW: 17 % — ABNORMAL HIGH (ref 11.5–15.5)
WBC: 15.6 10*3/uL — ABNORMAL HIGH (ref 4.0–10.5)
nRBC: 0 % (ref 0.0–0.2)

## 2019-10-04 LAB — BASIC METABOLIC PANEL
Anion gap: 9 (ref 5–15)
BUN: 17 mg/dL (ref 8–23)
CO2: 22 mmol/L (ref 22–32)
Calcium: 9.3 mg/dL (ref 8.9–10.3)
Chloride: 104 mmol/L (ref 98–111)
Creatinine, Ser: 1.04 mg/dL (ref 0.61–1.24)
GFR calc Af Amer: >60 mL/min (ref >60)
GFR calc non Af Amer: >60 mL/min (ref >60)
Glucose, Bld: 104 mg/dL — ABNORMAL HIGH (ref 70–99)
Potassium: 4.2 mmol/L (ref 3.5–5.1)
Sodium: 135 mmol/L (ref 135–145)

## 2019-10-04 LAB — GLUCOSE, CAPILLARY
Glucose-Capillary: 102 mg/dL — ABNORMAL HIGH (ref 70–99)
Glucose-Capillary: 102 mg/dL — ABNORMAL HIGH (ref 70–99)
Glucose-Capillary: 100 mg/dL — ABNORMAL HIGH (ref 70–99)
Glucose-Capillary: 169 mg/dL — ABNORMAL HIGH (ref 70–99)
Glucose-Capillary: 130 mg/dL — ABNORMAL HIGH (ref 70–99)

## 2019-10-04 SURGERY — ESOPHAGOGASTRODUODENOSCOPY (EGD) WITH PROPOFOL
Anesthesia: Monitor Anesthesia Care

## 2019-10-04 MED ORDER — PROPOFOL 500 MG/50ML IV EMUL
INTRAVENOUS | Status: DC | PRN
  Administered 2019-10-04: 100 ug/kg/min via INTRAVENOUS

## 2019-10-04 MED ORDER — SODIUM CHLORIDE 0.9 % IV SOLN: INTRAVENOUS | Status: DC

## 2019-10-04 MED ORDER — CARVEDILOL 3.125 MG PO TABS
3.1250 mg | ORAL_TABLET | Freq: Two times a day (BID) | ORAL | Status: DC
  Administered 2019-10-04 – 2019-10-06 (×4): 3.125 mg via ORAL
  Filled 2019-10-04 (×5): qty 1

## 2019-10-04 MED ORDER — LACTATED RINGERS IV SOLN
INTRAVENOUS | Status: DC
  Administered 2019-10-04: 1000 mL via INTRAVENOUS

## 2019-10-04 MED ORDER — PHENYLEPHRINE 40 MCG/ML (10ML) SYRINGE FOR IV PUSH (FOR BLOOD PRESSURE SUPPORT)
PREFILLED_SYRINGE | INTRAVENOUS | Status: DC | PRN
  Administered 2019-10-04 (×2): 80 ug via INTRAVENOUS

## 2019-10-04 MED ORDER — FLUCONAZOLE 100 MG PO TABS
100.0000 mg | ORAL_TABLET | Freq: Every day | ORAL | Status: AC
  Administered 2019-10-04 – 2019-10-06 (×3): 100 mg via ORAL
  Filled 2019-10-04 (×3): qty 1

## 2019-10-04 SURGICAL SUPPLY — 15 items

## 2019-10-04 NOTE — Anesthesia Postprocedure Evaluation (Signed)
Anesthesia [name] Note  Patient: Brandon [name]  Procedure(s) Performed: ESOPHAGOGASTRODUODENOSCOPY (EGD) WITH PROPOFOL (N/A )     Patient location during evaluation: PACU Anesthesia Type: MAC Level of consciousness: awake and alert Pain management: pain level controlled Vital Signs Assessment: [name]-procedure vital signs reviewed and stable Respiratory status: spontaneous breathing, nonlabored ventilation and respiratory function stable Cardiovascular status: blood pressure returned to baseline and stable Postop Assessment: no apparent nausea or vomiting Anesthetic complications: no    Last Vitals:  Vitals:   10/04/19 1022 10/04/19 1030  BP: (!) 106/39 (!) 124/52  Pulse: 88 78  Resp: 16 (!) 21  Temp:    SpO2: 100% 97%    Last Pain:  Vitals:   10/04/19 1030  TempSrc:   PainSc: 0-No pain                 Jarome Matin Halen [place]

## 2019-10-04 NOTE — Progress Notes (Signed)
   Vital Signs MEWS/VS Documentation      10/04/2019 1352 10/04/2019 1748 10/04/2019 1753 10/04/2019 1942   MEWS Score:  0  2  2  0   MEWS Score Color:  Green  Yellow  Yellow  Green   Resp:  16  20  --  20   Pulse:  87  (!) 116  (!) 124  94   BP:  101/73  121/60  --  107/64   Temp:  98.2 F (36.8 C)  100 F (37.8 C)  --  99.5 F (37.5 C)   O2 Device:  Room Air  Room Air  --  Room Air     Pt noted to have brief episode of confusion. Neuro check completed, pt noted slightly disoriented, however, oriented x4. Pt c/o chills. Temp noted 100.0 F orally. Heart rhythm irregular on auscultation, pt noted to be in Afib on EKG.       Taysean Wager D Darrek Leasure 10/04/2019,7:47 PM

## 2019-10-04 NOTE — Anesthesia Preprocedure Evaluation (Addendum)
Anesthesia Evaluation  Patient identified by MRN, date of birth, ID band Patient awake    Reviewed: Allergy & Precautions, NPO status , Patient's Chart, lab work & pertinent test results  Airway Mallampati: II  TM Distance: >3 FB Neck ROM: Full    Dental  (+) Poor Dentition, Edentulous Upper, Missing,    Pulmonary former smoker,  Former smoker, quit 1990   Pulmonary exam normal        Cardiovascular + CAD and + CABG  Normal cardiovascular exam+ dysrhythmias Atrial Fibrillation + Valvular Problems/Murmurs AS and AI  Rhythm:Regular Rate:Normal  CABG x 1 1999  HLD  AF- on xarelto, dilt, amio  Last echo 08/2019: 1. Left ventricular ejection fraction, by estimation, is 55 to 60%. The left ventricle has normal function. The left ventricle has no regional wall motion abnormalities. There is mild asymmetric left ventricular hypertrophy of the basal-septal segment.  Left ventricular diastolic function could not be evaluated.  2. Right ventricular systolic function is normal. The right ventricular size is normal. Tricuspid regurgitation signal is inadequate for assessing PA pressure.  3. The mitral valve is grossly normal. Trivial mitral valve  regurgitation.  4. The aortic valve is tricuspid. Aortic valve regurgitation is mild. Mild aortic valve stenosis. Aortic valve area, by VTI measures 1.96 cm.  Aortic valve mean gradient measures 15.0 mmHg. Aortic valve Vmax measures 2.68 m/s.  5. The inferior vena cava is normal in size with greater than 50% respiratory variability, suggesting right atrial pressure of 3 mmHg.    Neuro/Psych negative neurological ROS  negative psych ROS   GI/Hepatic GERD  Medicated and Controlled,(+) Cirrhosis   Esophageal Varices  substance abuse  alcohol use, Cirrhosis with portal HTN, gastric AVMs Melena  Hx gastric hemorrhage 2/2 gastric AVMs 07/2019    Endo/Other  diabetes, Well Controlled, Type  2, Oral Hypoglycemic AgentsMorbid obesityBMI 34 Last A1c 4.4  Renal/GU negative Renal ROS  negative genitourinary   Musculoskeletal Chronic pain   Abdominal (+) + obese,   Peds  Hematology  (+) Blood dyscrasia, anemia , H/H this AM 8.7/29.6 from 6.9/23.5 yesterday    Anesthesia Other Findings   Reproductive/Obstetrics negative OB ROS                            Anesthesia Physical Anesthesia Plan  ASA: IV  Anesthesia Plan: MAC   Post-op Pain Management:    Induction:   PONV Risk Score and Plan: 2 and Propofol infusion and TIVA  Airway Management Planned: Natural Airway and Simple Face Mask  Additional Equipment: None  Intra-op Plan:   Post-operative Plan:   Informed Consent: I have reviewed the patients History and Physical, chart, labs and discussed the procedure including the risks, benefits and alternatives for the proposed anesthesia with the patient or authorized representative who has indicated his/her understanding and acceptance.       Plan Discussed with: CRNA  Anesthesia Plan Comments:         Anesthesia Quick Evaluation

## 2019-10-04 NOTE — Transfer of Care (Signed)
Immediate Anesthesia Transfer of Care Note  Patient: Brandon [name]  Procedure(s) Performed: ESOPHAGOGASTRODUODENOSCOPY (EGD) WITH PROPOFOL (N/A )  Patient Location: PACU and Endoscopy Unit  Anesthesia Type:MAC  Level of Consciousness: awake, alert , oriented and patient cooperative  Airway & Oxygen Therapy: Patient Spontanous Breathing and Patient connected to face mask oxygen  [name]-op Assessment: Report given to RN, [name] -op Vital signs reviewed and stable and Patient moving all extremities  [name] vital signs: Reviewed and stable  Last Vitals:  Vitals Value Taken Time  BP    Temp    Pulse 89 10/04/19 1022  Resp 13 10/04/19 1022  SpO2 100 % 10/04/19 1022  Vitals shown include unvalidated device data.  Last Pain:  Vitals:   10/04/19 0855  TempSrc: Oral  PainSc: 6          Complications: No apparent anesthesia complications

## 2019-10-04 NOTE — Progress Notes (Signed)
PROGRESS NOTE    Brandon [name]  [id number] DOB: January 30, 1946 DOA: 10/02/2019 PCP: Helane Rima, MD    Brief Narrative:  Patient is 74 year old gentleman with history of hepatic cirrhosis and portal hypertension, gastric AVM, previous upper GI bleeding and recent endoscopy evaluation x2, colon cancer status post partial colectomy, A. fib on Xarelto and type 2 diabetes as well morbid obesity presented to the emergency room with 1 day of weakness and shortness of breath, large black bowel movement.  No abdominal pain.  Patient is on Protonix.  He is on Xarelto since diagnosis of A. fib 2 months ago.  Most recent hospitalization 2/19-2/22 with a BM. In the emergency room hemodynamically stable.  Hemoglobin 6.1.  INR 2.5.  Admitted with acute GI bleeding.   Assessment & Plan:   Active Problems:   Melena  Acute on chronic anemia due to GI blood loss, symptomatic anemia with melena:  received total 3 units of PRBC and now appropriately responded.  Patient on Protonix.  Bleeding aggravated by use of Xarelto. Seen by gastroenterology, underwent upper GI endoscopy today.  Patient was found to have diffuse portal gastropathy and touch bleeding. 1 dose of vitamin K given. Recheck hemoglobin tomorrow morning. Gastric biopsies were done.  GI recommended Diflucan for 3 days until biopsy results are available.  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  Abstinence for more than 1 year now.  Will resume Lasix and Aldactone once blood pressures are stable.  With diffuse gastric portal gastropathy, suggested beta-blockers.  On multivitamins.  Liver test are fairly normal.  Paroxysmal A. fib: Currently sinus rhythm on amiodarone.  Holding Xarelto.  Third episode of bleeding in last 2 months requiring multiple blood transfusions.  Patient does not want to go back on anticoagulation. He follows up with cardiologist at [facility], will call their office to notify them of patient going off  anticoagulation and follow-up.  Type 2 diabetes: On oral hypoglycemics at home.  Currently on sliding scale insulin.  Coronary artery disease status post CABG: Fairly stable at this time.  Transfusion to keep adequate hemoglobin.   DVT prophylaxis: SCDs Code Status: DNR Family Communication: None Disposition Plan: patient is from home. Anticipated DC to home, Barriers to discharge severe anemia requiring transfusions, plan for inpatient procedures today.  Hopefully discharge tomorrow.   Consultants:   GI  Procedures:   None  Antimicrobials:   None   Subjective: Patient seen and examined in the morning rounds.  No overnight events.  Was going for procedure. Came back from procedure in the afternoon, found to have diffusely bleeding gastropathy.  Objective: Vitals:   10/04/19 0855 10/04/19 1022 10/04/19 1030 10/04/19 1040  BP: (!) 150/87 (!) 106/39 (!) 124/52 127/78  Pulse: 88 88 78 85  Resp: 20 16 (!) 21 17  Temp: 98.3 F (36.8 C)     TempSrc: Oral     SpO2: 100% 100% 97% 98%  Weight:      Height:        Intake/Output Summary (Last 24 hours) at 10/04/2019 1232 Last data filed at 10/04/2019 1022 Gross per 24 hour  Intake 516.64 ml  Output 1350 ml  Net -833.36 ml   Filed Weights   10/02/19 2143 10/03/19 0344 10/04/19 0500  Weight: 129.1 kg 132.3 kg 127.8 kg    Examination:  General exam: Appears calm and comfortable, on room air. Respiratory system: Clear to auscultation. Respiratory effort normal.  No added sounds. Cardiovascular system: S1 & S2 heard, RRR. No JVD, murmurs,  rubs, gallops or clicks. No pedal edema. Gastrointestinal system: Abdomen is nondistended, soft and nontender.  Obese and pendulous.  Nontender.  Central nervous system: Alert and oriented. No focal neurological deficits. Extremities: Symmetric 5 x 5 power. Skin: No rashes, lesions or ulcers Psychiatry: Judgement and insight appear normal. Mood & affect appropriate.     Data Reviewed:  I have personally reviewed following labs and imaging studies  CBC: Recent Labs  Lab 10/02/19 1740 10/03/19 1054 10/04/19 0605  WBC 8.5 6.5 6.9  NEUTROABS 6.5  --  4.9  HGB 6.1* 6.9* 8.7*  HCT 22.0* 23.5* 29.6*  MCV 102.8* 97.9 97.4  PLT 284 221 [id number]   Basic Metabolic Panel: Recent Labs  Lab 10/02/19 1740 10/03/19 1054 10/04/19 0605  NA 134* 134* 135  K 4.8 4.3 4.2  CL 103 104 104  CO2 21* 24 22  GLUCOSE 148* 109* 104*  BUN 29* 23 17  CREATININE 1.33* 1.18 1.04  CALCIUM 9.3 8.9 9.3  MG 2.0  --   --   PHOS 4.2  --   --    GFR: Estimated Creatinine Clearance: 81.2 mL/min (by C-G formula based on SCr of 1.04 mg/dL). Liver Function Tests: Recent Labs  Lab 10/02/19 1740 10/03/19 1054  AST 23 15  ALT 22 17  ALKPHOS 92 77  BILITOT 0.3 0.6  PROT 6.4* 5.7*  ALBUMIN 3.4* 3.0*   No results for input(s): LIPASE, AMYLASE in the last 168 hours. No results for input(s): AMMONIA in the last 168 hours. Coagulation Profile: Recent Labs  Lab 10/02/19 1740 10/03/19 1054  INR 2.5* 1.6*   Cardiac Enzymes: No results for input(s): CKTOTAL, CKMB, CKMBINDEX, TROPONINI in the last 168 hours. BNP (last 3 results) No results for input(s): PROBNP in the last 8760 hours. HbA1C: No results for input(s): HGBA1C in the last 72 hours. CBG: Recent Labs  Lab 10/03/19 2029 10/03/19 2358 10/04/19 0407 10/04/19 0749 10/04/19 1155  GLUCAP 107* 120* 102* 102* 100*   Lipid Profile: No results for input(s): CHOL, HDL, LDLCALC, TRIG, CHOLHDL, LDLDIRECT in the last 72 hours. Thyroid Function Tests: No results for input(s): TSH, T4TOTAL, FREET4, T3FREE, THYROIDAB in the last 72 hours. Anemia Panel: No results for input(s): VITAMINB12, FOLATE, FERRITIN, TIBC, IRON, RETICCTPCT in the last 72 hours. Sepsis Labs: No results for input(s): PROCALCITON, LATICACIDVEN in the last 168 hours.  Recent Results (from the past 240 hour(s))  Respiratory Panel by RT PCR (Flu A&B, Covid) -  Nasopharyngeal Swab     Status: None   Collection Time: 10/02/19  6:06 PM   Specimen: Nasopharyngeal Swab  Result Value Ref Range Status   SARS Coronavirus 2 by RT PCR NEGATIVE NEGATIVE Final    Comment: (NOTE) SARS-CoV-2 target nucleic acids are NOT DETECTED. The SARS-CoV-2 RNA is generally detectable in upper respiratoy specimens during the acute phase of infection. The lowest concentration of SARS-CoV-2 viral copies this assay can detect is 131 copies/mL. A negative result does not preclude SARS-Cov-2 infection and should not be used as the sole basis for treatment or other patient management decisions. A negative result may occur with  improper specimen collection/handling, submission of specimen other than nasopharyngeal swab, presence of viral mutation(s) within the areas targeted by this assay, and inadequate number of viral copies (<131 copies/mL). A negative result must be combined with clinical observations, patient history, and epidemiological information. The expected result is Negative. Fact Sheet for Patients:  PinkCheek.be Fact Sheet for Healthcare Providers:  GravelBags.it This test is  not yet ap proved or cleared by the [place] and  has been authorized for detection and/or diagnosis of SARS-CoV-2 by FDA under an Emergency Use Authorization (EUA). This EUA will remain  in effect (meaning this test can be used) for the duration of the COVID-19 declaration under Section 564(b)(1) of the Act, 21 U.S.C. section 360bbb-3(b)(1), unless the authorization is terminated or revoked sooner.    Influenza A by PCR NEGATIVE NEGATIVE Final   Influenza B by PCR NEGATIVE NEGATIVE Final    Comment: (NOTE) The Xpert Xpress SARS-CoV-2/FLU/RSV assay is intended as an aid in  the diagnosis of influenza from Nasopharyngeal swab specimens and  should not be used as a sole basis for treatment. Nasal washings and  aspirates  are unacceptable for Xpert Xpress SARS-CoV-2/FLU/RSV  testing. Fact Sheet for Patients: PinkCheek.be Fact Sheet for Healthcare Providers: GravelBags.it This test is not yet approved or cleared by the [place] FDA and  has been authorized for detection and/or diagnosis of SARS-CoV-2 by  FDA under an Emergency Use Authorization (EUA). This EUA will remain  in effect (meaning this test can be used) for the duration of the  Covid-19 declaration under Section 564(b)(1) of the Act, 21  U.S.C. section 360bbb-3(b)(1), unless the authorization is  terminated or revoked. Performed at [facility], [place] [address] [place], [place] [address]          Radiology Studies: [facility] Chest Port 1 View  Result Date: 10/02/2019 CLINICAL DATA:  Shortness of breath. Additional history provided: Dark tarry stool for 1 day, fatigue, shortness of breath EXAM: PORTABLE CHEST 1 VIEW COMPARISON:  Chest radiograph 09/16/2019 FINDINGS: Prior median sternotomy and CABG. Unchanged cardiomegaly. Aortic atherosclerosis. No evidence of airspace consolidation within the lungs. Minimal left basilar atelectasis. No evidence of pleural effusion or pneumothorax. No acute bony abnormality. Thoracic spondylosis. IMPRESSION: Minimal left basilar atelectasis. Otherwise, no evidence of acute cardiopulmonary abnormality. Unchanged cardiomegaly.  Aortic atherosclerosis. Electronically Signed   By: Kellie Simmering DO   On: 10/02/2019 19:18        Scheduled Meds: . allopurinol  300 mg Oral QHS  . amiodarone  200 mg Oral Daily  . carvedilol  3.125 mg Oral BID WC  . cholecalciferol  2,000 Units Oral Daily  . finasteride  5 mg Oral Daily  . fluconazole  100 mg Oral BID  . folic acid  1 mg Oral Daily  . insulin aspart  0-15 Units Subcutaneous Q4H  . multivitamin with minerals  1 tablet Oral Daily  . pantoprazole  40 mg Intravenous Q12H  . pravastatin  10  mg Oral QHS  . tamsulosin  0.4 mg Oral QHS  . thiamine  100 mg Oral Daily   Continuous Infusions:   LOS: 2 days    Time spent: 25 minutes    Barb Merino, MD [facility] Pager [phone]

## 2019-10-04 NOTE — Progress Notes (Signed)
Responded to rapid response call this evening shortly after return from endoscopy. RN reports new onset disorientation after sleeping following procedure this afternoon. Unable to recall where he was or what was going on. Vital signs showing some tachycardia, no hypotension, no bleeding reported. On arrival to the room, rapid response RN present and ECG in progress.   Fortunately, patient's mentation had since cleared. He was joking about being a Republican, denied lightheadedness, palpitations, chest pain, abdominal pain, nausea, vomiting, recent stools or abnormal bleeding/bruising. He is alert, oriented x4 with irreg irreg S1 S2 with average rate ~100-110bpm. ECG confirms AFib rhythm without ischemic features. BP is not hypotensive. No ecchymoses noted. No abdominal tenderness, distention. No LE edema.   CBC ordered for stat check shows stable hgb, coreg low dose recommended by GI and will be given. BP sufficient for this and will assist with rate control. Suspect postprocedural delirium due to propofol which was transient, but will continue with neuro checks q4h and telemetry applied.  Vance Gather, MD 10/04/2019 7:07 PM

## 2019-10-04 NOTE — Interval H&P Note (Signed)
History and Physical Interval Note:  10/04/2019 9:04 AM  Brandon [name]  has presented today for surgery, with the diagnosis of melena.  The various methods of treatment have been discussed with the patient and family. After consideration of risks, benefits and other options for treatment, the patient has consented to  Procedure(s): ESOPHAGOGASTRODUODENOSCOPY (EGD) WITH PROPOFOL (N/A) as a surgical intervention.  The patient's history has been reviewed, patient examined, no change in status, stable for surgery.  I have reviewed the patient's chart and labs.  Questions were answered to the patient's satisfaction.     [name]

## 2019-10-04 NOTE — Op Note (Signed)
[facility] Patient Name: Brandon [name] Procedure Date: 10/04/2019 MRN: [id number] Attending MD: Mauri Pole , MD Date of Birth: 1945/11/27 CSN: [id number] Age: 74 Admit Type: Inpatient Procedure:                Upper GI endoscopy Indications:              Heme positive stool, Melena Providers:                Mauri Pole, MD, Kary Kos, RN, Elspeth Cho Tech., Technician, Courtney Heys [place], CRNA Referring MD:              Medicines:                Monitored Anesthesia Care Complications:            No immediate complications. Estimated Blood Loss:     Estimated blood loss: none. Procedure:                Pre-Anesthesia Assessment:                           - Prior to the procedure, a History and Physical                            was performed, and patient medications and                            allergies were reviewed. The patient's tolerance of                            previous anesthesia was also reviewed. The risks                            and benefits of the procedure and the sedation                            options and risks were discussed with the patient.                            All questions were answered, and informed consent                            was obtained. Prior Anticoagulants: The patient                            last took Xarelto (rivaroxaban) 2 days prior to the                            procedure. ASA Grade Assessment: IV - A patient                            with severe systemic disease that is a constant  threat to life. After reviewing the risks and                            benefits, the patient was deemed in satisfactory                            condition to undergo the procedure.                           After obtaining informed consent, the endoscope was                            passed under direct vision. Throughout the        procedure, the patient's blood pressure, pulse, and                            oxygen saturations were monitored continuously. The                            [id number] Olympus gastroscope was                            introduced through the mouth, and advanced to the                            second part of duodenum. The upper GI endoscopy was                            accomplished without difficulty. The patient                            tolerated the procedure well. Scope In: Scope Out: Findings:      Grade I varices were found in the lower third of the esophagus. They       were less than 1 mm in largest diameter.      The Z-line was regular and was found 35 cm from the incisors. Mild       candida esophagitis      Moderate portal hypertensive gastropathy was found in the entire       examined stomach. contact bleeding with scope and also noted active       oozing while clearing the mucosa with water lavage      The examined duodenum was normal. Impression:               - Grade I esophageal varices.                           - Z-line regular, 35 cm from the incisors. Mild                            candida esophagitis                           - Portal hypertensive gastropathy.                           -  Normal examined duodenum.                           - No specimens collected. Moderate Sedation:      N/A Recommendation:           - Resume previous diet.                           - Continue present medications.                           - Oral fluconazole 100mg  daily X 3 days                           - Monitor Hgb and tranfuse as needed                           - Please discuss with Cardiology if he need to stay                            on Xarelto (?potential long term risks of holding                            anticoagulation). From GI standpoint prefer to hold                            restarting Xarelto give h/o cirrhosis with portal                             hypertension, thrombocytopenia and coagulopathy.                           - Start a non selective beta blocker (Carvidelol or                            Nadolol) with dosage titrated by the heart rate 50                            as tolerated. Procedure Code(s):        --- Professional ---                           [phone], Esophagogastroduodenoscopy, flexible,                            transoral; diagnostic, including collection of                            specimen(s) by brushing or washing, when performed                            (separate procedure) Diagnosis Code(s):        --- Professional ---                           I85.00, Esophageal  varices without bleeding                           K76.6, Portal hypertension                           K31.89, Other diseases of stomach and duodenum                           R19.5, Other fecal abnormalities                           K92.1, Melena (includes Hematochezia) CPT copyright 2019 American Medical Association. All rights reserved. The codes documented in this report are preliminary and upon coder review may  be revised to meet current compliance requirements. Mauri Pole, MD 10/04/2019 10:45:43 AM This report has been signed electronically. Number of Addenda: 0

## 2019-10-05 ENCOUNTER — Encounter: Payer: Self-pay | Admitting: *Deleted

## 2019-10-05 ENCOUNTER — Other Ambulatory Visit: Payer: Self-pay

## 2019-10-05 DIAGNOSIS — K31811 Angiodysplasia of stomach and duodenum with bleeding: Secondary | ICD-10-CM

## 2019-10-05 DIAGNOSIS — K7031 Alcoholic cirrhosis of liver with ascites: Secondary | ICD-10-CM

## 2019-10-05 LAB — CBC WITH DIFFERENTIAL/PLATELET
Abs Immature Granulocytes: 0.04 10*3/uL (ref 0.00–0.07)
Basophils Absolute: 0.1 10*3/uL (ref 0.0–0.1)
Basophils Relative: 0 %
Eosinophils Absolute: 0.4 10*3/uL (ref 0.0–0.5)
Eosinophils Relative: 3 %
HCT: 26.9 % — ABNORMAL LOW (ref 39.0–52.0)
Hemoglobin: 7.9 g/dL — ABNORMAL LOW (ref 13.0–17.0)
Immature Granulocytes: 0 %
Lymphocytes Relative: 8 %
Lymphs Abs: 0.9 10*3/uL (ref 0.7–4.0)
MCH: 28.8 pg (ref 26.0–34.0)
MCHC: 29.4 g/dL — ABNORMAL LOW (ref 30.0–36.0)
MCV: 98.2 fL (ref 80.0–100.0)
Monocytes Absolute: 1.1 10*3/uL — ABNORMAL HIGH (ref 0.1–1.0)
Monocytes Relative: 9 %
Neutro Abs: 9.3 10*3/uL — ABNORMAL HIGH (ref 1.7–7.7)
Neutrophils Relative %: 80 %
Platelets: 210 10*3/uL (ref 150–400)
RBC: 2.74 MIL/uL — ABNORMAL LOW (ref 4.22–5.81)
RDW: 16.7 % — ABNORMAL HIGH (ref 11.5–15.5)
WBC: 11.7 10*3/uL — ABNORMAL HIGH (ref 4.0–10.5)
nRBC: 0 % (ref 0.0–0.2)

## 2019-10-05 LAB — GLUCOSE, CAPILLARY
Glucose-Capillary: 102 mg/dL — ABNORMAL HIGH (ref 70–99)
Glucose-Capillary: 117 mg/dL — ABNORMAL HIGH (ref 70–99)
Glucose-Capillary: 126 mg/dL — ABNORMAL HIGH (ref 70–99)
Glucose-Capillary: 158 mg/dL — ABNORMAL HIGH (ref 70–99)
Glucose-Capillary: 95 mg/dL (ref 70–99)
Glucose-Capillary: 99 mg/dL (ref 70–99)

## 2019-10-05 LAB — AFP TUMOR MARKER: AFP, Serum, Tumor Marker: 2.2 ng/mL (ref 0.0–8.3)

## 2019-10-05 MED ORDER — INSULIN ASPART 100 UNIT/ML ~~LOC~~ SOLN: 0.0000 [IU] | Freq: Every day | SUBCUTANEOUS | Status: DC

## 2019-10-05 MED ORDER — INSULIN ASPART 100 UNIT/ML ~~LOC~~ SOLN: 0.0000 [IU] | Freq: Three times a day (TID) | SUBCUTANEOUS | Status: DC

## 2019-10-05 MED ORDER — SODIUM CHLORIDE 0.9 % IV SOLN
510.0000 mg | Freq: Once | INTRAVENOUS | Status: AC
  Administered 2019-10-05: 510 mg via INTRAVENOUS
  Filled 2019-10-05: qty 17

## 2019-10-05 MED ORDER — DIPHENHYDRAMINE HCL 25 MG PO CAPS
25.0000 mg | ORAL_CAPSULE | Freq: Three times a day (TID) | ORAL | Status: DC | PRN
  Administered 2019-10-05 – 2019-10-06 (×2): 25 mg via ORAL
  Filled 2019-10-05 (×2): qty 1

## 2019-10-05 NOTE — Progress Notes (Signed)
Occupational Therapy Evaluation  Clinical Impression Patient reports living in a ground level apartment with mother. Patient reports performing all self-care tasks with Modified Independence using walker to mobilize. Patient only assists mother with IADL tasks in home. Overall patient requires Modified Independence to Max A for self-care tasks and mobilize with RW at Minimal A level. Patient reports wearing no shoes in home and has difficulty with donning socks/shoes.Patient was noted with increased SOB after mobilizing in hallway with rolling walker. Pt will benefit from continued skilled acute OT services.    10/05/19 1300  OT Visit Information  Assistance Needed +1  PT/OT/SLP Co-Evaluation/Treatment Yes  PT goals addressed during session Mobility/safety with mobility;Balance;Proper use of DME  OT goals addressed during session ADL's and self-care  History of Present Illness 74 year old gentleman with history of hepatic cirrhosis and portal hypertension, gastric AVM, previous upper GI bleeding and recent endoscopy evaluation x2, colon cancer status post partial colectomy, A. fib on Xarelto and type 2 diabetes as well morbid obesity presented to the emergency room with 1 day of weakness and shortness of breath, large black bowel movement.  Pt is on xarelto for afib. Recent hospitaliation 2/19-2/22/21 with GIB.  Precautions  Precautions Fall  Restrictions  Weight Bearing Restrictions No  Home Living  Family/patient expects to be discharged to: Private residence  Living Arrangements Parent  Type of [place] level  [place] unit  [place] Accessibility Yes  How Accessible Accessible via walker  [place] - 2 wheels;Tub bench;Grab bars - tub/shower;Grab bars - toilet  Additional Comments son lives closeby, sleep in recliner   Prior Function  Level of Independence  Independent with assistive device(s)  Comments lives with mother, assist only with IADL tasks for mother  Communication  Communication No difficulties  Pain Assessment  Pain Assessment 0-10  Pain Score 6  Pain Location neck to knees  Cognition  Arousal/Alertness Awake/alert  ADL  Overall ADL's  Needs assistance/impaired  Eating/Feeding Modified independent  Grooming Oral care;Wash/dry face;Wash/dry hands;Set up  Upper Body Bathing Minimal assistance  Lower Body Bathing Maximal assistance  Upper Body Dressing  Set up  Lower Body Dressing Maximal assistance  Toilet Transfer Minimal assistance;RW  Toileting- Clothing Manipulation and Hygiene Minimal assistance  Tub/ Shower Transfer Moderate assistance  Functional mobility during ADLs Minimal assistance;Rolling walker  Vision- History  Baseline Vision/History No visual deficits (hx of cataract sx)  Bed Mobility  Overal bed mobility Needs Assistance  Bed Mobility Supine to Sit  Supine to sit Supervision;HOB elevated  Transfers  Overall transfer level Needs assistance  Equipment used Rolling walker (2 wheeled)  Transfers Sit to/from [employer] Transfers  Sit to Stand Min assist  Stand pivot transfers Min assist  Balance  Overall balance assessment Needs assistance  Sitting balance-Leahy Scale Fair  Standing balance-Leahy Scale Good  OT - End of Session  Equipment Utilized During Treatment Gait belt;Rolling walker  Activity Tolerance Patient tolerated treatment well  Patient left in chair;with call bell/phone within reach;with chair alarm set  Nurse Communication Mobility status  OT Assessment  OT Recommendation/Assessment Patient needs continued OT Services  OT Visit Diagnosis History of falling (Z91.81);Unsteadiness on feet (R26.81);Muscle weakness (generalized) (M62.81)  OT Problem List Decreased strength;Decreased activity tolerance;Impaired balance (sitting and/or standing);Decreased safety awareness;Pain;Obesity   OT Plan  OT Frequency (ACUTE ONLY) Min 2X/week  OT Treatment/Interventions (ACUTE ONLY) Self-care/ADL training;Therapeutic exercise;Therapeutic activities;Patient/family education;Balance training;Energy conservation  AM-PAC OT "6 Clicks" Daily Activity Outcome Measure (Version 2)  Help from another person eating meals? 4  Help from another person taking care of personal grooming? 3  Help from another person toileting, which includes using toliet, bedpan, or urinal? 2  Help from another person bathing (including washing, rinsing, drying)? 2  Help from another person to put on and taking off regular upper body clothing? 3  Help from another person to put on and taking off regular lower body clothing? 2  6 Click Score 16  OT Recommendation  Follow Up Recommendations No OT follow up (versus HHOT pending patient's progress with acute OT service)  OT Equipment None recommended by OT  Individuals Consulted  Consulted and Agree with Results and Recommendations Patient  Acute Rehab OT Goals  Patient Stated Goal to go home   OT Goal Formulation With patient  Time For Goal Achievement 10/19/19  Potential to Achieve Goals Good  OT Time Calculation  OT Start Time (ACUTE ONLY) 1258  OT Stop Time (ACUTE ONLY) 1322  OT Time Calculation (min) 24 min  OT General Charges  $OT Visit 1 Visit  OT Evaluation  $OT Eval Moderate Complexity 1 Mod  OT Treatments  $Self Care/Home Management  8-22 mins  Written Expression  Dominant Hand Right    Mailynn Everly OTR/L  Remus Hagedorn OTR/L

## 2019-10-05 NOTE — Discharge Instructions (Signed)
Please come to the office in 1 week for repeat blood work/labs to recheck your blood counts.  The order is already entered so you just need to go to the basement level of our building at [name] to have those labs drawn.  We will call you with those results.  Follow-up office visit is also listed in these discharge instructions.

## 2019-10-05 NOTE — Evaluation (Signed)
Physical Therapy Evaluation Patient Details Name: Brandon [name] MRN: [id number] DOB: 1946/07/19 Today's Date: 10/05/2019   History of Present Illness  74 year old gentleman with history of R patellar tendon tear s/p repair March 2020, hepatic cirrhosis and portal hypertension, gastric AVM, previous upper GI bleeding and recent endoscopy evaluation x2, colon cancer status post partial colectomy, A. fib on Xarelto and type 2 diabetes as well morbid obesity presented to the emergency room with 1 day of weakness and shortness of breath, large black bowel movement.  Pt is on xarelto for afib. Recent hospitaliation 2/19-2/22/21 with GIB.  Clinical Impression  Pt ambulated 150' with RW, no loss of balance. HR 80s with ambulation, 2/4 dyspnea. From PT standpoint, he is safe to DC home, no further PT indicated. Will sign off.     Follow Up Recommendations No PT follow up    Equipment Recommendations  None recommended by PT    Recommendations for Other Services       Precautions / Restrictions Precautions Precautions: Fall Precaution Comments: pt had a fall in March 2020 in which he sustained R patellar tendon tear s/p repair, he denies other falls since then Restrictions Weight Bearing Restrictions: No      Mobility  Bed Mobility Overal bed mobility: Modified Independent             General bed mobility comments: HOB up, used rail  Transfers Overall transfer level: Needs assistance Equipment used: Rolling walker (2 wheeled) Transfers: Sit to/from Stand Sit to Stand: Min guard         General transfer comment: min/guard for safety, no physical assist needed, no loss of balance  Ambulation/Gait Ambulation/Gait assistance: Supervision Gait Distance (Feet): 150 Feet Assistive device: Rolling walker (2 wheeled) Gait Pattern/deviations: Step-through pattern;Trunk flexed;Decreased stride length Gait velocity: WNL   General Gait Details: steady with RW, no loss of balance, HR  80s, 2/4 dyspnea, distance limited by fatigue  Stairs            Wheelchair Mobility    Modified Rankin (Stroke Patients Only)       Balance Overall balance assessment: Modified Independent                                           Pertinent Vitals/Pain Pain Assessment: 0-10 Pain Score: 6  Pain Location: neck to knees    Home Living Family/patient expects to be discharged to:: Private residence Living Arrangements: Parent Available Help at Discharge: Family;Available 24 hours/day Type of Home: Apartment Home Access: Ramped entrance     Home Layout: One level Home Equipment: Walker - 2 wheels;Tub bench;Grab bars - tub/shower;Grab bars - toilet Additional Comments: son lives closeby, sleep in recliner, has food/meals delivered to home, doesn't drive 2* cataract surgery, sleeps in recliner    Prior Function Level of Independence: Independent with assistive device(s)         Comments: walks with RW     Hand Dominance   Dominant Hand: Right    Extremity/Trunk Assessment   Upper Extremity Assessment Upper Extremity Assessment: Defer to OT evaluation    Lower Extremity Assessment Lower Extremity Assessment: Overall WFL for tasks assessed(decreased sensation to light touch B feet, pt reports h/o peripheral neuropathy)    Cervical / Trunk Assessment Cervical / Trunk Assessment: Normal  Communication   Communication: No difficulties  Cognition Arousal/Alertness: Awake/alert Behavior During Therapy: WFL for tasks assessed/performed  Overall Cognitive Status: Within Functional Limits for tasks assessed                                        General Comments      Exercises     Assessment/Plan    PT Assessment Patent does not need any further PT services  PT Problem List         PT Treatment Interventions      PT Goals (Current goals can be found in the Care Plan section)  Acute Rehab PT Goals PT Goal  Formulation: All assessment and education complete, DC therapy    Frequency     Barriers to discharge        Co-evaluation PT/OT/SLP Co-Evaluation/Treatment: Yes Reason for Co-Treatment: For patient/therapist safety PT goals addressed during session: Mobility/safety with mobility;Balance;Proper use of DME         AM-PAC PT "6 Clicks" Mobility  Outcome Measure Help needed turning from your back to your side while in a flat bed without using bedrails?: None Help needed moving from lying on your back to sitting on the side of a flat bed without using bedrails?: A Little Help needed moving to and from a bed to a chair (including a wheelchair)?: None Help needed standing up from a chair using your arms (e.g., wheelchair or bedside chair)?: None Help needed to walk in hospital room?: None Help needed climbing 3-5 steps with a railing? : A Little 6 Click Score: 22    End of Session Equipment Utilized During Treatment: Gait belt Activity Tolerance: Patient tolerated treatment well Patient left: in chair;with call bell/phone within reach;with chair alarm set Nurse Communication: Mobility status      Time: [id number] PT Time Calculation (min) (ACUTE ONLY): 24 min   Charges:   PT Evaluation $PT Eval Low Complexity: 1 Low         Philomena Doheny PT 10/05/2019  Acute Rehabilitation Services Pager [phone] Office [phone]

## 2019-10-05 NOTE — Care Management Important Message (Signed)
Important Message  Patient Details IM Letter given to Roque Lias SW Case Manager to present to the Patient Name: Brandon [name] MRN: [id number] Date of Birth: 1946/03/02   Medicare Important Message Given:  Yes     Kerin Salen 10/05/2019, 12:05 PM

## 2019-10-05 NOTE — Progress Notes (Signed)
[facility] ROUNDING NOTE   Subjective: No BM, tolerating PO well. Feels better.   Objective: Vital signs in last 24 hours: Temp:  [97.8 F (36.6 C)-100 F (37.8 C)] 98.4 F (36.9 C) (03/10 0649) Pulse Rate:  [84-124] 88 (03/10 0649) Resp:  [16-20] 18 (03/10 0649) BP: (101-121)/(60-76) 119/75 (03/10 0649) SpO2:  [95 %-98 %] 97 % (03/10 0649) Weight:  [129.1 kg] 129.1 kg (03/10 0649) Last BM Date: 10/01/19     Intake/Output from previous day: 03/09 0701 - 03/10 0700 In: 1662.1 [I.V.:1662.1] Out: 1450 [Urine:1450] Intake/Output this shift: Total I/O In: 630.1 [P.O.:238; I.V.:392.1] Out: -    Lab Results: Recent Labs    10/04/19 0605 10/04/19 1828 10/05/19 0616  WBC 6.9 15.6* 11.7*  HGB 8.7* 8.7* 7.9*  PLT 224 243 210  MCV 97.4 95.8 98.2   BMET Recent Labs    10/02/19 1740 10/03/19 1054 10/04/19 0605  NA 134* 134* 135  K 4.8 4.3 4.2  CL 103 104 104  CO2 21* 24 22  GLUCOSE 148* 109* 104*  BUN 29* 23 17  CREATININE 1.33* 1.18 1.04  CALCIUM 9.3 8.9 9.3   LFT Recent Labs    10/02/19 1740 10/03/19 1054  PROT 6.4* 5.7*  ALBUMIN 3.4* 3.0*  AST 23 15  ALT 22 17  ALKPHOS 92 77  BILITOT 0.3 0.6   PT/INR Recent Labs    10/02/19 1740 10/03/19 1054  INR 2.5* 1.6*      Imaging/Other results: No results found.    Assessment &Plan  23 yr M with morbid obesity, NASH and ETOH cirrhosis compensated, Afib on Xarelto admitted with worsening anemia.  EGD 10/04/18 with portal hypertensive gastropathy with oozing of mucosa with minimal contact  Plan to hold Xarelto indefinitely if cleared by cardiology Continue Carvidelol 3.25 BID, titrate up as tolerated PPI PO BID Monitor Hgb and transfuse as needed to maintain Hgb >7 IV iron infusion as inpatient  5.9cm hepatic lesion: AFP normal , MRI abd pending if not done as inpatient, will plan to schedule as outpatient.   No significant ascites  No hepatic encephalopathy  Will need CBC  follow up in 1 week after discharge and GI follow up in 2-4 weeks  GI will sign off, available if have any questions    K. Denzil Magnuson , MD [phone]  [facility]

## 2019-10-05 NOTE — TOC Initial Note (Signed)
Transition of Care [facility]) - Initial/Assessment Note    Patient Details  Name: Brandon [name] MRN: [id number] Date of Birth: Oct 03, 1945  [facility] CM/SW Contact:    Trish Mage, LCSW Phone Number: 10/05/2019, 3:26 PM  Clinical Narrative:    Patient here with upper GI bleed, anemia will return home at d/c where he lives his 73 YO mother.  States she does the cooking and he does the cleaning, along with some help from his son who lives half a mile away.  Has DME at home of RW, 3 in 1 and grab handles in shower/tub.  His insurance pays for him to use Lyft transport to medical appointments.  PT has signed off on him, saying he does not need that service at home.  No further needs identified.  TOC will continue to follow during the course of hospitalization.                Expected Discharge Plan: Home/Self Care Barriers to Discharge: No Barriers Identified   Patient Goals and CMS Choice Patient states their goals for this hospitalization and ongoing recovery are:: "I feel like I am falling apart.  What'cha gonna do?"      Expected Discharge Plan and Services Expected Discharge Plan: Home/Self Care   Discharge Planning Services: CM Consult   Living arrangements for the past 2 months: Single Family Home Expected Discharge Date: (unknown)                                    Prior Living Arrangements/Services Living arrangements for the past 2 months: Single Family Home Lives with:: Parents Patient language and need for interpreter reviewed:: Yes Do you feel safe going back to the place where you live?: Yes      Need for Family Participation in Patient Care: Yes (Comment) Care giver support system in place?: Yes (comment) Current home services: DME Criminal Activity/Legal Involvement Pertinent to Current Situation/Hospitalization: No - Comment as needed  Activities of Daily Living Home Assistive Devices/Equipment: CBG Meter, Cane (specify quad or straight),  Eyeglasses, Grab bars around toilet, Grab bars in shower, Walker (specify type), Wheelchair, Other (Comment)(single point cane, front wheeled walker, tub/shower unit) ADL Screening (condition at time of admission) Patient's cognitive ability adequate to safely complete daily activities?: Yes Is the patient deaf or have difficulty hearing?: No Does the patient have difficulty seeing, even when wearing glasses/contacts?: Yes Does the patient have difficulty concentrating, remembering, or making decisions?: No Patient able to express need for assistance with ADLs?: Yes Does the patient have difficulty dressing or bathing?: No Independently performs ADLs?: Yes (appropriate for developmental age) Does the patient have difficulty walking or climbing stairs?: Yes(knee problems-does not do stairs) Weakness of Legs: Both Weakness of Arms/Hands: None  Permission Sought/Granted                  Emotional Assessment Appearance:: Appears stated age Attitude/Demeanor/Rapport: Engaged Affect (typically observed): Appropriate Orientation: : Oriented to Self, Oriented to Place, Oriented to Situation Alcohol / Substance Use: Alcohol Use Psych Involvement: No (comment)  Admission diagnosis:  Melena [K92.1] Acute upper GI bleed [K92.2] Symptomatic anemia [D64.9] Patient Active Problem List   Diagnosis Date Noted  . Alcoholic cirrhosis of liver with ascites ([place])   . Portal hypertensive gastropathy ([place])   . Melena 10/02/2019  . Angiodysplasia of stomach with hemorrhage   . Gastrointestinal hemorrhage   .  Atrial fibrillation ([place])   . Chronic pain syndrome   . Symptomatic anemia 08/13/2019  . Acute upper GI bleed 08/13/2019  . Avulsion of right patellar tendon   . Closed displaced fracture of phalanx of toe of right foot 10/06/2018  . 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 Patella fracture, extensor mechanism disruption R knee  09/29/2018  . Hyponatremia 08/23/2018  . Obstipation 08/23/2018  .  History of colon cancer 08/23/2018  . CAD (coronary artery disease) 08/23/2018  . DM (diabetes mellitus), type 2 ([place]) 08/23/2018  . Pyuria 08/23/2018   PCP:  Helane Rima, MD Pharmacy:   [place] [facility], [address] [place] [address] Phone: [phone] Fax: [phone] - [place], [place] - [address] AT [place] [address] [place] [address] Phone: [phone] Fax: [phone]  [facility] [place], [place] - [address] [place] [address] Phone: [phone] Fax: [phone]     Social Determinants of Health (SDOH) Interventions    Readmission Risk Interventions No flowsheet data found.

## 2019-10-05 NOTE — Progress Notes (Signed)
PROGRESS NOTE    Brandon [name]  [id number] DOB: 09-17-1945 DOA: 10/02/2019 PCP: Helane Rima, MD    Brief Narrative:  Patient is 74 year old gentleman with history of hepatic cirrhosis and portal hypertension, gastric AVM, previous upper GI bleeding and recent endoscopy evaluation x2, colon cancer status post partial colectomy, A. fib on Xarelto and type 2 diabetes as well morbid obesity presented to the emergency room with 1 day of weakness and shortness of breath, large black bowel movement.  No abdominal pain.  Patient is on Protonix.  He is on Xarelto since diagnosis of A. fib 2 months ago.  Most recent hospitalization 2/19-2/22 with a BM. In the emergency room hemodynamically stable.  Hemoglobin 6.1.  INR 2.5.  Admitted with acute GI bleeding.   Assessment & Plan:   Active Problems:   Acute upper GI bleed   Melena   Portal hypertensive gastropathy (HCC)  Acute on chronic anemia due to GI blood loss, symptomatic anemia with melena: received total 3 units of PRBC and now appropriately responded.  Patient on Protonix.  Bleeding aggravated by use of Xarelto. Seen by gastroenterology, underwent upper GI endoscopy and he was found to have diffuse portal gastropathy and touch bleeding. 1 dose of vitamin K given. Recheck hemoglobin tomorrow morning. Gastric biopsies were done.  GI recommended Diflucan for 3 days until biopsy results are available. 1 dose of IV iron today.  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  Abstinence for more than 1 year now.  Will resume Lasix and Aldactone once blood pressures are stable.  With diffuse gastric portal gastropathy, suggested beta-blockers.  On multivitamins.  Liver test are fairly normal.  Paroxysmal A. fib: Developed rapid A. fib after procedure.  Currently sinus rhythm on amiodarone.  Holding Xarelto.  Third episode of bleeding in last 2 months requiring multiple blood transfusions.  Patient does not want to go back on  anticoagulation. He follows up with cardiologist at [facility], will call their office to notify them of patient going off anticoagulation and follow-up.  Type 2 diabetes: On oral hypoglycemics at home.  Currently on sliding scale insulin.  Coronary artery disease status post CABG: Fairly stable at this time.  Transfusion to keep adequate hemoglobin.   DVT prophylaxis: SCDs Code Status: DNR Family Communication: None Disposition Plan: patient is from home. Anticipated DC to home, Barriers to discharge, need close monitoring for his hemoglobin, GI suggested 1 more day of monitoring in the hospital for stabilization.  Hoping to discharge home tomorrow if remains stable.  Consultants:   GI  Procedures:   None  Antimicrobials:   None   Subjective: Patient seen and examined.  No overnight events.  He had an episode last evening, woke up confused after coming back from procedure.  Developed transient A. fib RVR that is stabilized and currently asymptomatic. He thinks he is very deconditioned and was not sure whether he can ambulate with his walker.  He will work with PT OT today.  Objective: Vitals:   10/04/19 1942 10/04/19 2157 10/05/19 0318 10/05/19 0649  BP: 107/64 116/70 119/76 119/75  Pulse: 94 84 84 88  Resp: 20 19 18 18   Temp: 99.5 F (37.5 C) 98.4 F (36.9 C) 97.8 F (36.6 C) 98.4 F (36.9 C)  TempSrc:   Oral Oral  SpO2: 95% 98% 95% 97%  Weight:    129.1 kg  Height:        Intake/Output Summary (Last 24 hours) at 10/05/2019 1305 Last data filed at 10/05/2019 1000  Gross per 24 hour  Intake 1822.03 ml  Output 1450 ml  Net 372.03 ml   Filed Weights   10/03/19 0344 10/04/19 0500 10/05/19 0649  Weight: 132.3 kg 127.8 kg 129.1 kg    Examination:  General exam: Appears calm and comfortable, on room air.  Chronically sick looking.  Not in any distress. Respiratory system: Clear to auscultation. Respiratory effort normal.  No added sounds. Cardiovascular system: S1 &  S2 heard, RRR. No JVD, murmurs, rubs, gallops or clicks. No pedal edema. Gastrointestinal system: Abdomen is nondistended, soft and nontender.  Obese and pendulous.  Nontender.  Central nervous system: Alert and oriented. No focal neurological deficits. Extremities: Symmetric 5 x 5 power. Skin: No rashes, lesions or ulcers Psychiatry: Judgement and insight appear normal. Mood & affect appropriate.     Data Reviewed: I have personally reviewed following labs and imaging studies  CBC: Recent Labs  Lab 10/02/19 1740 10/03/19 1054 10/04/19 0605 10/04/19 1828 10/05/19 0616  WBC 8.5 6.5 6.9 15.6* 11.7*  NEUTROABS 6.5  --  4.9  --  9.3*  HGB 6.1* 6.9* 8.7* 8.7* 7.9*  HCT 22.0* 23.5* 29.6* 29.4* 26.9*  MCV 102.8* 97.9 97.4 95.8 98.2  PLT 284 221 224 243 [id number]   Basic Metabolic Panel: Recent Labs  Lab 10/02/19 1740 10/03/19 1054 10/04/19 0605  NA 134* 134* 135  K 4.8 4.3 4.2  CL 103 104 104  CO2 21* 24 22  GLUCOSE 148* 109* 104*  BUN 29* 23 17  CREATININE 1.33* 1.18 1.04  CALCIUM 9.3 8.9 9.3  MG 2.0  --   --   PHOS 4.2  --   --    GFR: Estimated Creatinine Clearance: 81.6 mL/min (by C-G formula based on SCr of 1.04 mg/dL). Liver Function Tests: Recent Labs  Lab 10/02/19 1740 10/03/19 1054  AST 23 15  ALT 22 17  ALKPHOS 92 77  BILITOT 0.3 0.6  PROT 6.4* 5.7*  ALBUMIN 3.4* 3.0*   No results for input(s): LIPASE, AMYLASE in the last 168 hours. No results for input(s): AMMONIA in the last 168 hours. Coagulation Profile: Recent Labs  Lab 10/02/19 1740 10/03/19 1054  INR 2.5* 1.6*   Cardiac Enzymes: No results for input(s): CKTOTAL, CKMB, CKMBINDEX, TROPONINI in the last 168 hours. BNP (last 3 results) No results for input(s): PROBNP in the last 8760 hours. HbA1C: No results for input(s): HGBA1C in the last 72 hours. CBG: Recent Labs  Lab 10/04/19 1944 10/05/19 0022 10/05/19 0321 10/05/19 0752 10/05/19 1152  GLUCAP 130* 117* 126* 95 99   Lipid  Profile: No results for input(s): CHOL, HDL, LDLCALC, TRIG, CHOLHDL, LDLDIRECT in the last 72 hours. Thyroid Function Tests: No results for input(s): TSH, T4TOTAL, FREET4, T3FREE, THYROIDAB in the last 72 hours. Anemia Panel: No results for input(s): VITAMINB12, FOLATE, FERRITIN, TIBC, IRON, RETICCTPCT in the last 72 hours. Sepsis Labs: No results for input(s): PROCALCITON, LATICACIDVEN in the last 168 hours.  Recent Results (from the past 240 hour(s))  Respiratory Panel by RT PCR (Flu A&B, Covid) - Nasopharyngeal Swab     Status: None   Collection Time: 10/02/19  6:06 PM   Specimen: Nasopharyngeal Swab  Result Value Ref Range Status   SARS Coronavirus 2 by RT PCR NEGATIVE NEGATIVE Final    Comment: (NOTE) SARS-CoV-2 target nucleic acids are NOT DETECTED. The SARS-CoV-2 RNA is generally detectable in upper respiratoy specimens during the acute phase of infection. The lowest concentration of SARS-CoV-2 viral copies this assay can  detect is 131 copies/mL. A negative result does not preclude SARS-Cov-2 infection and should not be used as the sole basis for treatment or other patient management decisions. A negative result may occur with  improper specimen collection/handling, submission of specimen other than nasopharyngeal swab, presence of viral mutation(s) within the areas targeted by this assay, and inadequate number of viral copies (<131 copies/mL). A negative result must be combined with clinical observations, patient history, and epidemiological information. The expected result is Negative. Fact Sheet for Patients:  PinkCheek.be Fact Sheet for Healthcare Providers:  GravelBags.it This test is not yet ap proved or cleared by the [place] FDA and  has been authorized for detection and/or diagnosis of SARS-CoV-2 by FDA under an Emergency Use Authorization (EUA). This EUA will remain  in effect (meaning this test can be  used) for the duration of the COVID-19 declaration under Section 564(b)(1) of the Act, 21 U.S.C. section 360bbb-3(b)(1), unless the authorization is terminated or revoked sooner.    Influenza A by PCR NEGATIVE NEGATIVE Final   Influenza B by PCR NEGATIVE NEGATIVE Final    Comment: (NOTE) The Xpert Xpress SARS-CoV-2/FLU/RSV assay is intended as an aid in  the diagnosis of influenza from Nasopharyngeal swab specimens and  should not be used as a sole basis for treatment. Nasal washings and  aspirates are unacceptable for Xpert Xpress SARS-CoV-2/FLU/RSV  testing. Fact Sheet for Patients: PinkCheek.be Fact Sheet for Healthcare Providers: GravelBags.it This test is not yet approved or cleared by the [place] FDA and  has been authorized for detection and/or diagnosis of SARS-CoV-2 by  FDA under an Emergency Use Authorization (EUA). This EUA will remain  in effect (meaning this test can be used) for the duration of the  Covid-19 declaration under Section 564(b)(1) of the Act, 21  U.S.C. section 360bbb-3(b)(1), unless the authorization is  terminated or revoked. Performed at [facility], [place] [address] [place], [place] [address]          Radiology Studies: No results found.      Scheduled Meds: . allopurinol  300 mg Oral QHS  . amiodarone  200 mg Oral Daily  . carvedilol  3.125 mg Oral BID WC  . cholecalciferol  2,000 Units Oral Daily  . finasteride  5 mg Oral Daily  . fluconazole  100 mg Oral Daily  . folic acid  1 mg Oral Daily  . insulin aspart  0-15 Units Subcutaneous Q4H  . multivitamin with minerals  1 tablet Oral Daily  . pantoprazole  40 mg Intravenous Q12H  . pravastatin  10 mg Oral QHS  . tamsulosin  0.4 mg Oral QHS  . thiamine  100 mg Oral Daily   Continuous Infusions: . sodium chloride 100 mL/hr at 10/05/19 0348     LOS: 3 days    Time spent: 25 minutes    Barb Merino, MD [facility] Pager [phone]

## 2019-10-06 DIAGNOSIS — K7031 Alcoholic cirrhosis of liver with ascites: Secondary | ICD-10-CM

## 2019-10-06 DIAGNOSIS — K3189 Other diseases of stomach and duodenum: Secondary | ICD-10-CM

## 2019-10-06 DIAGNOSIS — K766 Portal hypertension: Principal | ICD-10-CM

## 2019-10-06 LAB — TYPE AND SCREEN
ABO/RH(D): B POS
Antibody Screen: NEGATIVE
Unit division: 0
Unit division: 0
Unit division: 0
Unit division: 0

## 2019-10-06 LAB — CBC WITH DIFFERENTIAL/PLATELET
Abs Immature Granulocytes: 0.04 10*3/uL (ref 0.00–0.07)
Basophils Absolute: 0 10*3/uL (ref 0.0–0.1)
Basophils Relative: 1 %
Eosinophils Absolute: 0.5 10*3/uL (ref 0.0–0.5)
Eosinophils Relative: 7 %
HCT: 29.7 % — ABNORMAL LOW (ref 39.0–52.0)
Hemoglobin: 8.5 g/dL — ABNORMAL LOW (ref 13.0–17.0)
Immature Granulocytes: 1 %
Lymphocytes Relative: 11 %
Lymphs Abs: 0.8 10*3/uL (ref 0.7–4.0)
MCH: 27.7 pg (ref 26.0–34.0)
MCHC: 28.6 g/dL — ABNORMAL LOW (ref 30.0–36.0)
MCV: 96.7 fL (ref 80.0–100.0)
Monocytes Absolute: 0.7 10*3/uL (ref 0.1–1.0)
Monocytes Relative: 9 %
Neutro Abs: 5.2 10*3/uL (ref 1.7–7.7)
Neutrophils Relative %: 71 %
Platelets: 236 10*3/uL (ref 150–400)
RBC: 3.07 MIL/uL — ABNORMAL LOW (ref 4.22–5.81)
RDW: 16.2 % — ABNORMAL HIGH (ref 11.5–15.5)
WBC: 7.3 10*3/uL (ref 4.0–10.5)
nRBC: 0 % (ref 0.0–0.2)

## 2019-10-06 LAB — BPAM RBC
Blood Product Expiration Date: 202104072359
Blood Product Expiration Date: 202104082359
Blood Product Expiration Date: 202104082359
Blood Product Expiration Date: 202104082359
ISSUE DATE / TIME: 202103072019
ISSUE DATE / TIME: 202103080527
ISSUE DATE / TIME: 202103081236
Unit Type and Rh: 7300
Unit Type and Rh: 7300
Unit Type and Rh: 7300
Unit Type and Rh: 7300

## 2019-10-06 LAB — GLUCOSE, CAPILLARY
Glucose-Capillary: 83 mg/dL (ref 70–99)
Glucose-Capillary: 132 mg/dL — ABNORMAL HIGH (ref 70–99)

## 2019-10-06 MED ORDER — CARVEDILOL 3.125 MG PO TABS: 3.1250 mg | ORAL_TABLET | Freq: Two times a day (BID) | ORAL | 0 refills | Status: DC

## 2019-10-06 MED ORDER — PANTOPRAZOLE SODIUM 40 MG PO TBEC: 40.0000 mg | DELAYED_RELEASE_TABLET | Freq: Two times a day (BID) | ORAL | 3 refills | Status: DC

## 2019-10-06 MED FILL — CARVEDILOL 3.125 MG TABLET: 3.125 | 30 days supply | Qty: 60 | Fill #0

## 2019-10-06 MED FILL — PANTOPRAZOLE SOD DR 40 MG T: 40 | 30 days supply | Qty: 60 | Fill #0

## 2019-10-06 NOTE — Progress Notes (Signed)
Brandon [name] to be D/C'd Home per MD order.  Discussed prescriptions and follow up appointments with the patient. Prescriptions given to patient, medication list explained in detail. Pt verbalized understanding.  Allergies as of 10/06/2019   No Known Allergies     Medication List    STOP taking these medications   Artificial Tears 1.4 % ophthalmic solution Generic drug: polyvinyl alcohol   ascorbic acid 1000 MG tablet Commonly known as: VITAMIN C   aspirin EC 81 MG tablet   diclofenac Sodium 1 % Gel Commonly known as: VOLTAREN   diltiazem 180 MG 24 hr capsule Commonly known as: CARDIZEM CD   magnesium oxide 400 (241.3 Mg) MG tablet Commonly known as: MAG-OX   thiamine 100 MG tablet   Xarelto 20 MG Tabs tablet Generic drug: rivaroxaban     TAKE these medications   allopurinol 300 MG tablet Commonly known as: ZYLOPRIM Take 300 mg by mouth at bedtime.   amiodarone 200 MG tablet Commonly known as: PACERONE Take 200 mg by mouth daily.   carvedilol 3.125 MG tablet Commonly known as: COREG Take 1 tablet (3.125 mg total) by mouth 2 (two) times daily with a meal.   cholecalciferol 25 MCG (1000 UNIT) tablet Commonly known as: VITAMIN D3 Take 2 tablets (2,000 Units total) by mouth 2 (two) times daily. What changed: when to take this   ferrous sulfate 325 (65 FE) MG tablet Take 325 mg by mouth daily.   finasteride 5 MG tablet Commonly known as: PROSCAR Take 5 mg by mouth daily.   folic acid 1 MG tablet Commonly known as: FOLVITE Take 1 tablet (1 mg total) by mouth daily.   furosemide 20 MG tablet Commonly known as: LASIX Take 20 mg by mouth daily.   metFORMIN 500 MG 24 hr tablet Commonly known as: GLUCOPHAGE-XR Take 500 mg by mouth daily with breakfast.   multivitamin with minerals Tabs tablet Take 1 tablet by mouth daily.   pantoprazole 40 MG tablet Commonly known as: PROTONIX Take 1 tablet (40 mg total) by mouth 2 (two) times daily. What changed:    how much to take  how to take this  when to take this  additional instructions   polyethylene glycol 17 g packet Commonly known as: MIRALAX / GLYCOLAX Take 17 g by mouth 2 (two) times daily. What changed:   when to take this  reasons to take this  additional instructions   potassium chloride 10 MEQ tablet Commonly known as: KLOR-CON Take 10 mEq by mouth daily.   pravastatin 10 MG tablet Commonly known as: PRAVACHOL Take 10 mg by mouth at bedtime.   senna-docusate 8.6-50 MG tablet Commonly known as: Senokot-S Take 1 tablet by mouth 2 (two) times daily. What changed: when to take this   spironolactone 50 MG tablet Commonly known as: Aldactone Take 1 tablet (50 mg total) by mouth daily for 30 doses.   tamsulosin 0.4 MG Caps capsule Commonly known as: FLOMAX Take 0.4 mg by mouth at bedtime.       Vitals:   10/05/19 2050 10/06/19 0529  BP: 128/77 125/82  Pulse: 81 70  Resp: 18 18  Temp: 98 F (36.7 C) 97.7 F (36.5 C)  SpO2: 97% 97%    Skin clean, dry and intact without evidence of skin break down, no evidence of skin tears noted. IV catheter discontinued intact. Site without signs and symptoms of complications. Dressing and pressure applied. Pt denies pain at this time. No complaints noted.  An  After Visit Summary was printed and given to the patient. Patient to discharge around 1630 with family.   [name] S 10/06/2019 11:05 AM 2

## 2019-10-06 NOTE — Discharge Summary (Signed)
Physician Discharge Summary  Brandon [name] [id number] DOB: 09-16-1945 DOA: 10/02/2019  PCP: Helane Rima, MD  Admit date: 10/02/2019 Discharge date: 10/06/2019  Admitted From: Home Disposition: Home  Recommendations for Outpatient Follow-up:  1. Follow up with PCP in 1-2 weeks 2. Please obtain BMP/CBC in one week 3. Follow-up with cardiology at [facility] as a scheduled. 4. GI to schedule follow-up with you.  Discharge Condition: Stable CODE STATUS: DNR Diet recommendation: Low-salt diet  Discharge summary: Patient is 74 year old gentleman with history of hepatic cirrhosis and portal hypertension, gastric AVM, previous upper GI bleeding and recent endoscopy evaluation x2, colon cancer status post partial colectomy, A. fib on Xarelto and type 2 diabetes as well morbid obesity presented to the emergency room with 1 day of weakness and shortness of breath, large black bowel movement.  No abdominal pain.  Patient is on Protonix.  He is on Xarelto since diagnosis of A. fib 2 months ago.  Most recent hospitalization 2/19-2/22 with GI bleeding. In the emergency room hemodynamically stable.  Hemoglobin 6.1.  INR 2.5.  Admitted with acute GI bleeding.  Acute on chronic anemia due to GI blood loss, symptomatic anemia with melena: received total 3 units of PRBC and now appropriately responded.  Also received 1 unit of IV iron. Treated with IV Protonix.  Underwent upper GI endoscopy and he was found to have diffuse portal gastropathy and touch of bleeding on multiple gastropathy lesions. Stabilized now. Discontinued aspirin and Xarelto. Patient will be going home on Protonix twice a day, close outpatient follow-up.  Due to hepatic gastropathy, Cardizem was stopped and started on beta-blockers with carvedilol.  Paroxysmal A. fib: Currently rate controlled.  Developed rapid A. fib which spontaneously improved while in the procedure.  Currently sinus rhythm.  On amiodarone, Cardizem changed to  carvedilol to help with hepatic gastropathy. Third admission with GI bleeding, will stop his Xarelto and aspirin at this time.  Will notify his cardiologist at [place] for follow-up.  Other chronic medical issues remained stable.  He is going home today.  Discharge Diagnoses:  Active Problems:   Acute upper GI bleed   Melena   Portal hypertensive gastropathy (HCC)   Alcoholic cirrhosis of liver with ascites [facility])    Discharge Instructions  Discharge Instructions    Diet - low sodium heart healthy   Complete by: As directed    Increase activity slowly   Complete by: As directed      Allergies as of 10/06/2019   No Known Allergies     Medication List    STOP taking these medications   Artificial Tears 1.4 % ophthalmic solution Generic drug: polyvinyl alcohol   ascorbic acid 1000 MG tablet Commonly known as: VITAMIN C   aspirin EC 81 MG tablet   diclofenac Sodium 1 % Gel Commonly known as: VOLTAREN   diltiazem 180 MG 24 hr capsule Commonly known as: CARDIZEM CD   magnesium oxide 400 (241.3 Mg) MG tablet Commonly known as: MAG-OX   thiamine 100 MG tablet   Xarelto 20 MG Tabs tablet Generic drug: rivaroxaban     TAKE these medications   allopurinol 300 MG tablet Commonly known as: ZYLOPRIM Take 300 mg by mouth at bedtime.   amiodarone 200 MG tablet Commonly known as: PACERONE Take 200 mg by mouth daily.   carvedilol 3.125 MG tablet Commonly known as: COREG Take 1 tablet (3.125 mg total) by mouth 2 (two) times daily with a meal.   cholecalciferol 25 MCG (1000 UNIT)  tablet Commonly known as: VITAMIN D3 Take 2 tablets (2,000 Units total) by mouth 2 (two) times daily. What changed: when to take this   ferrous sulfate 325 (65 FE) MG tablet Take 325 mg by mouth daily.   finasteride 5 MG tablet Commonly known as: PROSCAR Take 5 mg by mouth daily.   folic acid 1 MG tablet Commonly known as: FOLVITE Take 1 tablet (1 mg total) by mouth daily.    furosemide 20 MG tablet Commonly known as: LASIX Take 20 mg by mouth daily.   metFORMIN 500 MG 24 hr tablet Commonly known as: GLUCOPHAGE-XR Take 500 mg by mouth daily with breakfast.   multivitamin with minerals Tabs tablet Take 1 tablet by mouth daily.   pantoprazole 40 MG tablet Commonly known as: PROTONIX Take 1 tablet (40 mg total) by mouth 2 (two) times daily. What changed:   how much to take  how to take this  when to take this  additional instructions   polyethylene glycol 17 g packet Commonly known as: MIRALAX / GLYCOLAX Take 17 g by mouth 2 (two) times daily. What changed:   when to take this  reasons to take this  additional instructions   potassium chloride 10 MEQ tablet Commonly known as: KLOR-CON Take 10 mEq by mouth daily.   pravastatin 10 MG tablet Commonly known as: PRAVACHOL Take 10 mg by mouth at bedtime.   senna-docusate 8.6-50 MG tablet Commonly known as: Senokot-S Take 1 tablet by mouth 2 (two) times daily. What changed: when to take this   spironolactone 50 MG tablet Commonly known as: Aldactone Take 1 tablet (50 mg total) by mouth daily for 30 doses.   tamsulosin 0.4 MG Caps capsule Commonly known as: FLOMAX Take 0.4 mg by mouth at bedtime.      Follow-up Information    Helane Rima, MD Follow up.   Specialty: Family Medicine Contact information: [place] [id number] [phone]        Alfredia Ferguson, PA-C Follow up on 10/24/2019.   Specialty: Gastroenterology Why: 9:30 am Contact information: [place] [address] [phone]          No Known Allergies  Consultations:  Gastroenterology   Procedures/Studies: DG Chest Port 1 View  Result Date: 10/02/2019 CLINICAL DATA:  Shortness of breath. Additional history provided: Dark tarry stool for 1 day, fatigue, shortness of breath EXAM: PORTABLE CHEST 1 VIEW COMPARISON:  Chest radiograph 09/16/2019  FINDINGS: Prior median sternotomy and CABG. Unchanged cardiomegaly. Aortic atherosclerosis. No evidence of airspace consolidation within the lungs. Minimal left basilar atelectasis. No evidence of pleural effusion or pneumothorax. No acute bony abnormality. Thoracic spondylosis. IMPRESSION: Minimal left basilar atelectasis. Otherwise, no evidence of acute cardiopulmonary abnormality. Unchanged cardiomegaly.  Aortic atherosclerosis. Electronically Signed   By: Kellie Simmering DO   On: 10/02/2019 19:18   DG Chest Portable 1 View  Result Date: 09/16/2019 CLINICAL DATA:  Shortness of breath. Weakness and dizziness. EXAM: PORTABLE CHEST 1 VIEW COMPARISON:  Radiograph 08/13/2019 FINDINGS: Post median sternotomy and CABG. Cardiomegaly. Stable mediastinal contours with aortic atherosclerosis. Increased interstitial opacity from prior exam. Possible minimal pleural effusions. No evidence of pneumonia. No pneumothorax. Chronic change about the right shoulder. IMPRESSION: Cardiomegaly post CABG. Increased interstitial opacity suspicious for pulmonary edema. Possible minimal pleural effusions. Aortic Atherosclerosis (ICD10-I70.0). Electronically Signed   By: Keith Rake M.D.   On: 09/16/2019 05:39   ECHOCARDIOGRAM COMPLETE  Result Date: 09/16/2019  ECHOCARDIOGRAM REPORT   Patient Name:   Gerod Wildey Date of Exam: 09/16/2019 Medical Rec #:  [id number]    Height:       66.0 in Accession #:    [id number]   Weight:       324.1 lb Date of Birth:  April 13, 1946    BSA:          2.46 m Patient Age:    6 years     BP:           108/60 mmHg Patient Gender: M            HR:           85 bpm. Exam Location:  Inpatient Procedure: 2D Echo Indications:    Dyspnea 786.09 / R06.00                 Atrial Fibrillation 427.31 / I48.91  History:        Patient has no prior history of Echocardiogram examinations.                 CAD, Prior CABG; Risk Factors:Diabetes and Hypertension.  Sonographer:    Darlina Sicilian RDCS Referring Phys:  [id number] Oda Kilts  Sonographer Comments: Image acquisition challenging due to patient body habitus. Unable to use definity, the only IV was being used for a transfusion. IMPRESSIONS  1. Left ventricular ejection fraction, by estimation, is 55 to 60%. The left ventricle has normal function. The left ventricle has no regional wall motion abnormalities. There is mild asymmetric left ventricular hypertrophy of the basal-septal segment. Left ventricular diastolic function could not be evaluated.  2. Right ventricular systolic function is normal. The right ventricular size is normal. Tricuspid regurgitation signal is inadequate for assessing PA pressure.  3. The mitral valve is grossly normal. Trivial mitral valve regurgitation.  4. The aortic valve is tricuspid. Aortic valve regurgitation is mild. Mild aortic valve stenosis. Aortic valve area, by VTI measures 1.96 cm. Aortic valve mean gradient measures 15.0 mmHg. Aortic valve Vmax measures 2.68 m/s.  5. The inferior vena cava is normal in size with greater than 50% respiratory variability, suggesting right atrial pressure of 3 mmHg. FINDINGS  Left Ventricle: Left ventricular ejection fraction, by estimation, is 55 to 60%. The left ventricle has normal function. The left ventricle has no regional wall motion abnormalities. The left ventricular internal cavity size was normal in size. There is  mild asymmetric left ventricular hypertrophy of the basal-septal segment. Left ventricular diastolic function could not be evaluated due to atrial fibrillation. Left ventricular diastolic function could not be evaluated. Right Ventricle: The right ventricular size is normal. No increase in right ventricular wall thickness. Right ventricular systolic function is normal. Tricuspid regurgitation signal is inadequate for assessing PA pressure. Left Atrium: Left atrial size was normal in size. Right Atrium: Right atrial size was normal in size. Pericardium: Trivial pericardial  effusion is present. Mitral Valve: The mitral valve is grossly normal. Trivial mitral valve regurgitation. Tricuspid Valve: The tricuspid valve is grossly normal. Tricuspid valve regurgitation is mild. Aortic Valve: The aortic valve is tricuspid. . There is mild thickening and mild calcification of the aortic valve. Aortic valve regurgitation is mild. Aortic regurgitation PHT measures 533 msec. Mild aortic stenosis is present. There is mild thickening of the aortic valve. There is mild calcification of the aortic valve. Aortic valve mean gradient measures 15.0 mmHg. Aortic valve peak gradient measures 28.8 mmHg. Aortic valve area, by VTI measures 1.96 cm.  Pulmonic Valve: The pulmonic valve was grossly normal. Pulmonic valve regurgitation is not visualized. Aorta: The aortic root is normal in size and structure. Venous: The inferior vena cava is normal in size with greater than 50% respiratory variability, suggesting right atrial pressure of 3 mmHg. IAS/Shunts: No atrial level shunt detected by color flow Doppler.  LEFT VENTRICLE PLAX 2D LVIDd:         5.70 cm   Diastology LVIDs:         3.40 cm   LV e' lateral:   15.07 cm/s LV PW:         1.10 cm   LV E/e' lateral: 8.1 LV IVS:        1.60 cm   LV e' medial:    11.07 cm/s LVOT diam:     2.10 cm   LV E/e' medial:  11.0 LV SV:         102.87 ml LV SV Index:   41.62 LVOT Area:     3.46 cm  RIGHT VENTRICLE RV S prime:     12.40 cm/s LEFT ATRIUM             Index       RIGHT ATRIUM           Index LA diam:        4.20 cm 1.71 cm/m  RA Area:     20.50 cm LA Vol (A2C):   62.7 ml 25.54 ml/m RA Volume:   57.10 ml  23.26 ml/m LA Vol (A4C):   79.4 ml 32.34 ml/m LA Biplane Vol: 75.8 ml 30.87 ml/m  AORTIC VALVE AV Area (Vmax):    1.70 cm AV Area (Vmean):   1.96 cm AV Area (VTI):     1.96 cm AV Vmax:           268.33 cm/s AV Vmean:          182.333 cm/s AV VTI:            0.525 m AV Peak Grad:      28.8 mmHg AV Mean Grad:      15.0 mmHg LVOT Vmax:         132.00 cm/s  LVOT Vmean:        103.000 cm/s LVOT VTI:          0.297 m LVOT/AV VTI ratio: 0.57 AI PHT:            533 msec  AORTA Ao Root diam: 3.50 cm MITRAL VALVE MV Area (PHT): 2.65 cm     SHUNTS MV Decel Time: 286 msec     Systemic VTI:  0.30 m MV E velocity: 121.33 cm/s  Systemic Diam: 2.10 cm Eleonore Chiquito MD Electronically signed by Eleonore Chiquito MD Signature Date/Time: 09/16/2019/2:14:44 PM    Final       Subjective: Patient seen and examined.  No overnight events.  Denies any nausea or vomiting.  No abdominal pain.  Had bowel movement and was clear.   Discharge Exam: Vitals:   10/05/19 2050 10/06/19 0529  BP: 128/77 125/82  Pulse: 81 70  Resp: 18 18  Temp: 98 F (36.7 C) 97.7 F (36.5 C)  SpO2: 97% 97%   Vitals:   10/05/19 1406 10/05/19 2050 10/06/19 0500 10/06/19 0529  BP: (!) 91/51 128/77  125/82  Pulse: 64 81  70  Resp:  18  18  Temp:  98 F (36.7 C)  97.7 F (36.5 C)  TempSrc:  Oral  Oral  SpO2:  97%  97%  Weight:   130.2 kg   Height:        General: Pt is alert, awake, not in acute distress, on room air. Cardiovascular: RRR, S1/S2 +, no rubs, no gallops Respiratory: CTA bilaterally, no wheezing, no rhonchi Abdominal: Soft, NT, ND, bowel sounds +, obese and pendulous.. Extremities: no edema, no cyanosis    The results of significant diagnostics from this hospitalization (including imaging, microbiology, ancillary and laboratory) are listed below for reference.     Microbiology: Recent Results (from the past 240 hour(s))  Respiratory Panel by RT PCR (Flu A&B, Covid) - Nasopharyngeal Swab     Status: None   Collection Time: 10/02/19  6:06 PM   Specimen: Nasopharyngeal Swab  Result Value Ref Range Status   SARS Coronavirus 2 by RT PCR NEGATIVE NEGATIVE Final    Comment: (NOTE) SARS-CoV-2 target nucleic acids are NOT DETECTED. The SARS-CoV-2 RNA is generally detectable in upper respiratoy specimens during the acute phase of infection. The lowest concentration of  SARS-CoV-2 viral copies this assay can detect is 131 copies/mL. A negative result does not preclude SARS-Cov-2 infection and should not be used as the sole basis for treatment or other patient management decisions. A negative result may occur with  improper specimen collection/handling, submission of specimen other than nasopharyngeal swab, presence of viral mutation(s) within the areas targeted by this assay, and inadequate number of viral copies (<131 copies/mL). A negative result must be combined with clinical observations, patient history, and epidemiological information. The expected result is Negative. Fact Sheet for Patients:  PinkCheek.be Fact Sheet for Healthcare Providers:  GravelBags.it This test is not yet ap proved or cleared by the [place] FDA and  has been authorized for detection and/or diagnosis of SARS-CoV-2 by FDA under an Emergency Use Authorization (EUA). This EUA will remain  in effect (meaning this test can be used) for the duration of the COVID-19 declaration under Section 564(b)(1) of the Act, 21 U.S.C. section 360bbb-3(b)(1), unless the authorization is terminated or revoked sooner.    Influenza A by PCR NEGATIVE NEGATIVE Final   Influenza B by PCR NEGATIVE NEGATIVE Final    Comment: (NOTE) The Xpert Xpress SARS-CoV-2/FLU/RSV assay is intended as an aid in  the diagnosis of influenza from Nasopharyngeal swab specimens and  should not be used as a sole basis for treatment. Nasal washings and  aspirates are unacceptable for Xpert Xpress SARS-CoV-2/FLU/RSV  testing. Fact Sheet for Patients: PinkCheek.be Fact Sheet for Healthcare Providers: GravelBags.it This test is not yet approved or cleared by the [place] FDA and  has been authorized for detection and/or diagnosis of SARS-CoV-2 by  FDA under an Emergency Use Authorization (EUA).  This EUA will remain  in effect (meaning this test can be used) for the duration of the  Covid-19 declaration under Section 564(b)(1) of the Act, 21  U.S.C. section 360bbb-3(b)(1), unless the authorization is  terminated or revoked. Performed at [facility], [place] [address], [place], [place] [address]      Labs: BNP (last 3 results) Recent Labs    09/16/19 0559  BNP 123XX123*   Basic Metabolic Panel: Recent Labs  Lab 10/02/19 1740 10/03/19 1054 10/04/19 0605  NA 134* 134* 135  K 4.8 4.3 4.2  CL 103 104 104  CO2 21* 24 22  GLUCOSE 148* 109* 104*  BUN 29* 23 17  CREATININE 1.33* 1.18 1.04  CALCIUM 9.3 8.9 9.3  MG 2.0  --   --  PHOS 4.2  --   --    Liver Function Tests: Recent Labs  Lab 10/02/19 1740 10/03/19 1054  AST 23 15  ALT 22 17  ALKPHOS 92 77  BILITOT 0.3 0.6  PROT 6.4* 5.7*  ALBUMIN 3.4* 3.0*   No results for input(s): LIPASE, AMYLASE in the last 168 hours. No results for input(s): AMMONIA in the last 168 hours. CBC: Recent Labs  Lab 10/02/19 1740 10/02/19 1740 10/03/19 1054 10/04/19 0605 10/04/19 1828 10/05/19 0616 10/06/19 0544  WBC 8.5   < > 6.5 6.9 15.6* 11.7* 7.3  NEUTROABS 6.5  --   --  4.9  --  9.3* 5.2  HGB 6.1*   < > 6.9* 8.7* 8.7* 7.9* 8.5*  HCT 22.0*   < > 23.5* 29.6* 29.4* 26.9* 29.7*  MCV 102.8*   < > 97.9 97.4 95.8 98.2 96.7  PLT 284   < > 221 224 243 210 236   < > = values in this interval not displayed.   Cardiac Enzymes: No results for input(s): CKTOTAL, CKMB, CKMBINDEX, TROPONINI in the last 168 hours. BNP: Invalid input(s): POCBNP CBG: Recent Labs  Lab 10/05/19 0752 10/05/19 1152 10/05/19 1647 10/05/19 2047 10/06/19 0744  GLUCAP 95 99 102* 158* 83   D-Dimer No results for input(s): DDIMER in the last 72 hours. Hgb A1c No results for input(s): HGBA1C in the last 72 hours. Lipid Profile No results for input(s): CHOL, HDL, LDLCALC, TRIG, CHOLHDL, LDLDIRECT in the last 72 hours. Thyroid  function studies No results for input(s): TSH, T4TOTAL, T3FREE, THYROIDAB in the last 72 hours.  Invalid input(s): FREET3 Anemia work up No results for input(s): VITAMINB12, FOLATE, FERRITIN, TIBC, IRON, RETICCTPCT in the last 72 hours. Urinalysis    Component Value Date/Time   COLORURINE YELLOW 09/16/2019 0800   APPEARANCEUR CLOUDY (A) 09/16/2019 0800   LABSPEC 1.011 09/16/2019 0800   PHURINE 5.0 09/16/2019 0800   GLUCOSEU NEGATIVE 09/16/2019 0800   HGBUR MODERATE (A) 09/16/2019 0800   BILIRUBINUR NEGATIVE 09/16/2019 0800   KETONESUR NEGATIVE 09/16/2019 0800   PROTEINUR NEGATIVE 09/16/2019 0800   NITRITE NEGATIVE 09/16/2019 0800   LEUKOCYTESUR LARGE (A) 09/16/2019 0800   Sepsis Labs Invalid input(s): PROCALCITONIN,  WBC,  LACTICIDVEN Microbiology Recent Results (from the past 240 hour(s))  Respiratory Panel by RT PCR (Flu A&B, Covid) - Nasopharyngeal Swab     Status: None   Collection Time: 10/02/19  6:06 PM   Specimen: Nasopharyngeal Swab  Result Value Ref Range Status   SARS Coronavirus 2 by RT PCR NEGATIVE NEGATIVE Final    Comment: (NOTE) SARS-CoV-2 target nucleic acids are NOT DETECTED. The SARS-CoV-2 RNA is generally detectable in upper respiratoy specimens during the acute phase of infection. The lowest concentration of SARS-CoV-2 viral copies this assay can detect is 131 copies/mL. A negative result does not preclude SARS-Cov-2 infection and should not be used as the sole basis for treatment or other patient management decisions. A negative result may occur with  improper specimen collection/handling, submission of specimen other than nasopharyngeal swab, presence of viral mutation(s) within the areas targeted by this assay, and inadequate number of viral copies (<131 copies/mL). A negative result must be combined with clinical observations, patient history, and epidemiological information. The expected result is Negative. Fact Sheet for Patients:   PinkCheek.be Fact Sheet for Healthcare Providers:  GravelBags.it This test is not yet ap proved or cleared by the [place] FDA and  has been authorized for detection and/or diagnosis of SARS-CoV-2  by FDA under an Emergency Use Authorization (EUA). This EUA will remain  in effect (meaning this test can be used) for the duration of the COVID-19 declaration under Section 564(b)(1) of the Act, 21 U.S.C. section 360bbb-3(b)(1), unless the authorization is terminated or revoked sooner.    Influenza A by PCR NEGATIVE NEGATIVE Final   Influenza B by PCR NEGATIVE NEGATIVE Final    Comment: (NOTE) The Xpert Xpress SARS-CoV-2/FLU/RSV assay is intended as an aid in  the diagnosis of influenza from Nasopharyngeal swab specimens and  should not be used as a sole basis for treatment. Nasal washings and  aspirates are unacceptable for Xpert Xpress SARS-CoV-2/FLU/RSV  testing. Fact Sheet for Patients: PinkCheek.be Fact Sheet for Healthcare Providers: GravelBags.it This test is not yet approved or cleared by the [place] FDA and  has been authorized for detection and/or diagnosis of SARS-CoV-2 by  FDA under an Emergency Use Authorization (EUA). This EUA will remain  in effect (meaning this test can be used) for the duration of the  Covid-19 declaration under Section 564(b)(1) of the Act, 21  U.S.C. section 360bbb-3(b)(1), unless the authorization is  terminated or revoked. Performed at [facility], [address] [place], [place] [address]      Time coordinating discharge: 45 minutes  SIGNED:   Barb Merino, MD  [facility] 10/06/2019, 10:04 AM

## 2019-10-06 NOTE — Progress Notes (Signed)
Pt is being discharged home. Discharge instructions including medications and follow up appointments given. Pt had no further questions at this time. 

## 2019-10-24 ENCOUNTER — Ambulatory Visit: Payer: Medicare Other | Admitting: Physician Assistant

## 2019-10-24 ENCOUNTER — Ambulatory Visit
Admission: RE | Admit: 2019-10-24 | Discharge: 2019-10-24 | Disposition: A | Discharge status: Home / Self Care | Payer: Medicare Other | Source: Ambulatory Visit | Attending: Internal Medicine | Admitting: Internal Medicine

## 2019-10-24 DIAGNOSIS — K7031 Alcoholic cirrhosis of liver with ascites: Secondary | ICD-10-CM

## 2019-10-24 DIAGNOSIS — D500 Iron deficiency anemia secondary to blood loss (chronic): Secondary | ICD-10-CM

## 2019-10-24 DIAGNOSIS — K769 Liver disease, unspecified: Secondary | ICD-10-CM

## 2019-10-24 IMAGING — MR MR ABDOMEN WO/W CM
17 series · 48 of 48 positions shown · IV contrast (20ml Multihance)
Comparison: CT abdomen/pelvis dated [DATE]

CLINICAL DATA: Cirrhosis, colon cancer, follow up liver lesion on
CT

EXAM:
MRI ABDOMEN WITHOUT AND WITH CONTRAST
TECHNIQUE: Multiplanar multisequence MR imaging of the abdomen was performed
both before and after the administration of intravenous contrast.
CONTRAST:  20mL MULTIHANCE GADOBENATE DIMEGLUMINE 529 MG/ML IV SOLN

[Series 3: T2 · coronal · 5.0mm · 1.56mm/px · 3 of 49 slices shown (1 of 3)]
[im 1/49]
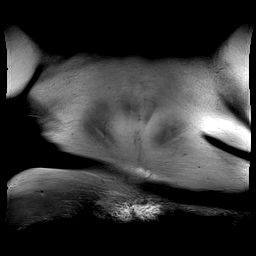
[im 25/49]
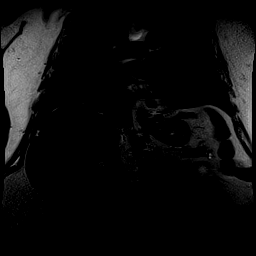
[im 49/49]
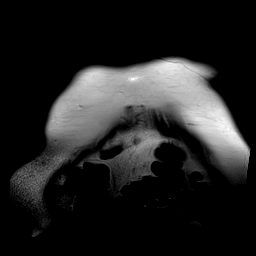

[Series 4: T1 · axial · 3.0mm · 1.25mm/px · z∈[-68,+121]mm · 5 of 128 slices shown]
[im 1/128]
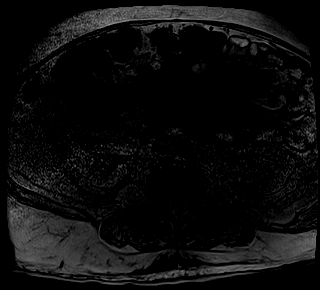
[im 32/128]
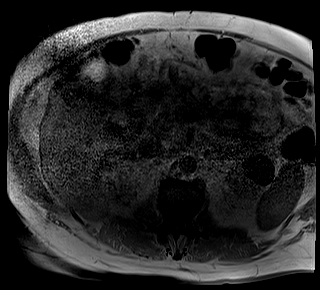
[im 64/128]
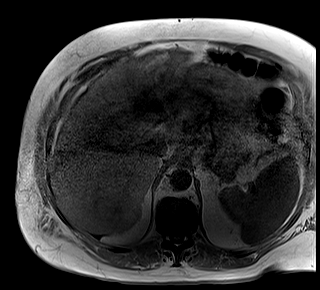
[im 96/128]
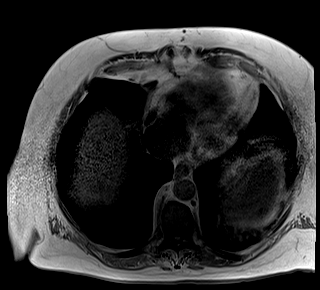
[im 128/128]
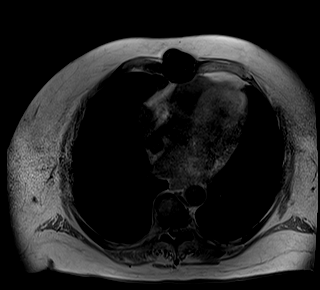

[Series 5: T2 · axial · 5.0mm · 2.08mm/px · z∈[-86,+136]mm · 2 of 38 slices shown (2 of 3)]
[im 1/38]
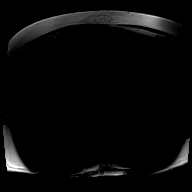
[im 38/38]
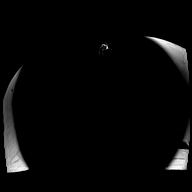

[Series 6: DWI · axial · 5.0mm · 1.42mm/px · z∈[-78,+132]mm · 4 of 108 slices shown (1 of 2)]
[im 1/108]
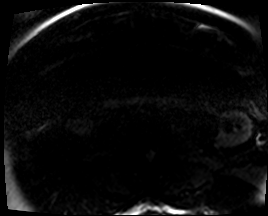
[im 36/108]
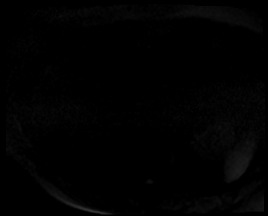
[im 72/108]
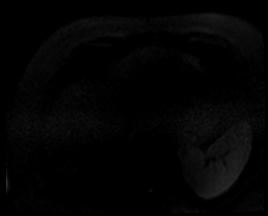
[im 108/108]
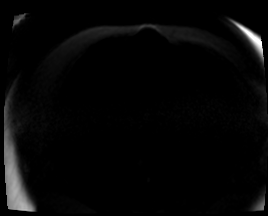

[Series 7: DWI · axial · 5.0mm · 1.42mm/px · 1 of 36 slices shown (2 of 2)]
[im 1/36]
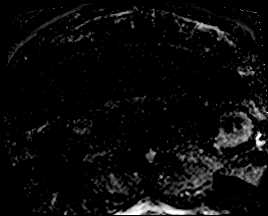

[Series 8: T2 · axial · 6.0mm · 1.19mm/px · 1 of 30 slices shown (3 of 3)]
[im 1/30]
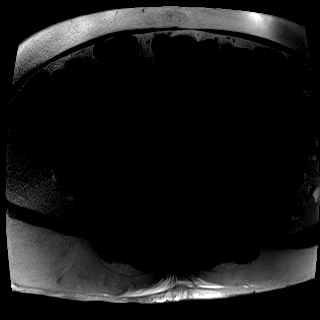

[Series 9: bSSFP · axial · 5.0mm · 1.25mm/px · 1 of 35 slices shown]
[im 1/35]
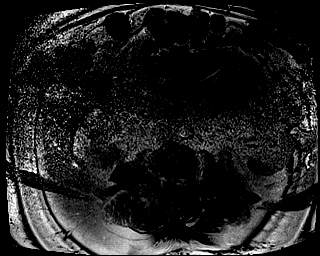

[Series 10: T1 dynamic · axial · non-contrast · 3.0mm · 1.25mm/px · z∈[-85,+128]mm · 3 of 72 slices shown]
[im 1/72]
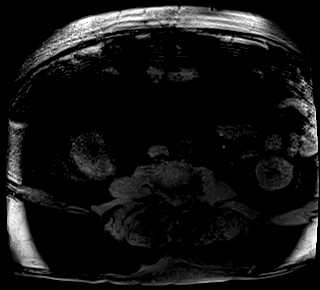
[im 36/72]
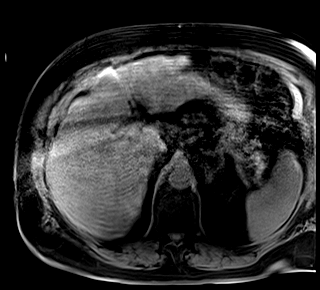
[im 72/72]
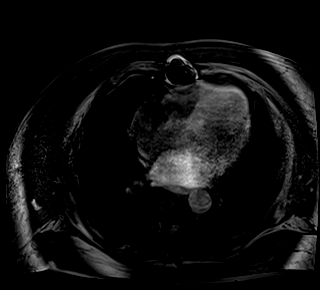

[Series 11: T1 dynamic post-contrast · axial · 3.0mm · 1.25mm/px · z∈[-85,+128]mm · 3 of 72 slices shown (1 of 9)]
[im 1/72]
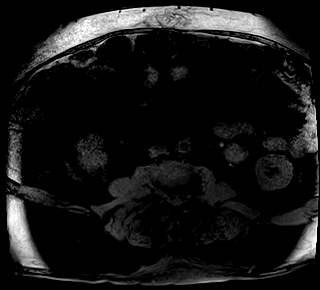
[im 36/72]
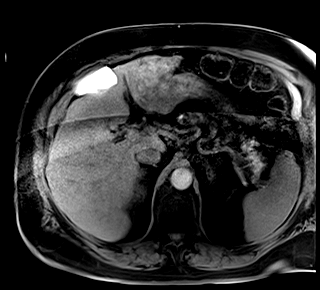
[im 72/72]
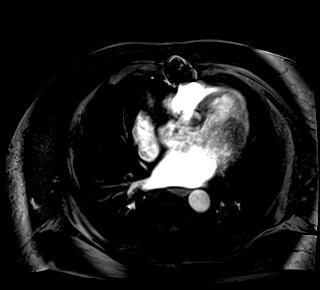

[Series 12: T1 dynamic post-contrast · axial · 3.0mm · 1.25mm/px · z∈[-85,+128]mm · 3 of 72 slices shown (2 of 9)]
[im 1/72]
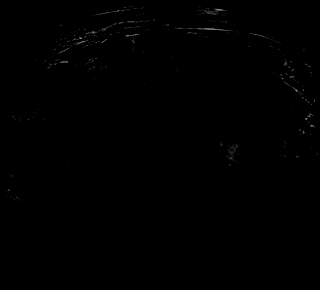
[im 36/72]
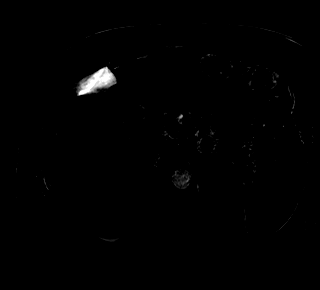
[im 72/72]
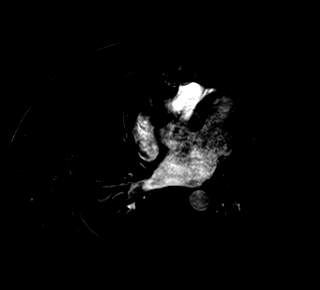

[Series 13: T1 dynamic post-contrast · axial · 3.0mm · 1.25mm/px · z∈[-85,+128]mm · 3 of 72 slices shown (3 of 9)]
[im 1/72]
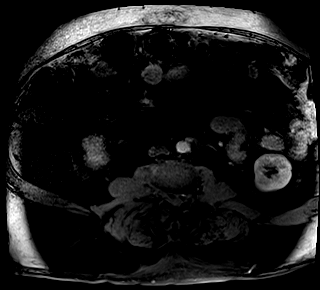
[im 36/72]
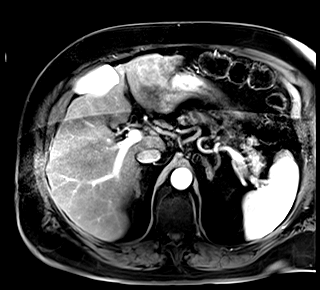
[im 72/72]
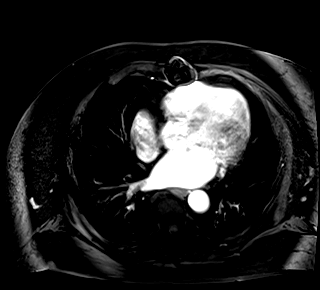

[Series 14: T1 dynamic post-contrast · axial · 3.0mm · 1.25mm/px · z∈[-85,+128]mm · 3 of 72 slices shown (4 of 9)]
[im 1/72]
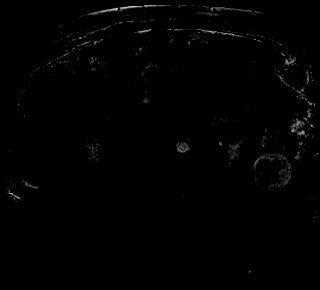
[im 36/72]
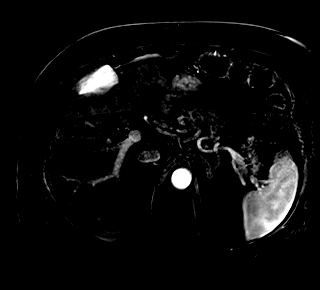
[im 72/72]
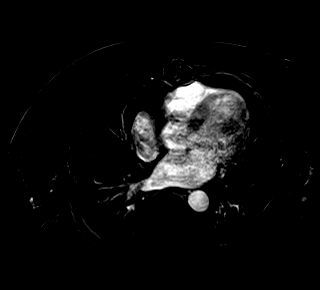

[Series 15: T1 dynamic post-contrast · axial · 3.0mm · 1.25mm/px · z∈[-85,+128]mm · 3 of 72 slices shown (5 of 9)]
[im 1/72]
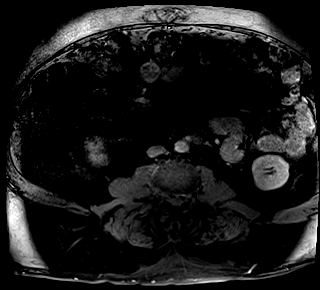
[im 36/72]
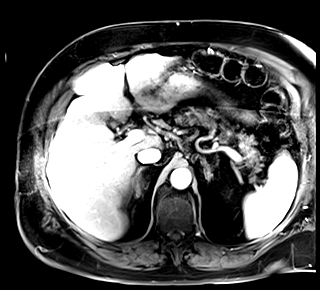
[im 72/72]
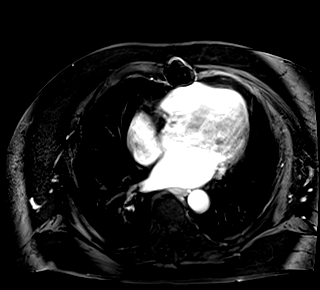

[Series 16: T1 dynamic post-contrast · axial · 3.0mm · 1.25mm/px · z∈[-85,+128]mm · 3 of 72 slices shown (6 of 9)]
[im 1/72]
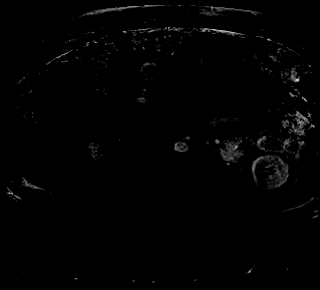
[im 36/72]
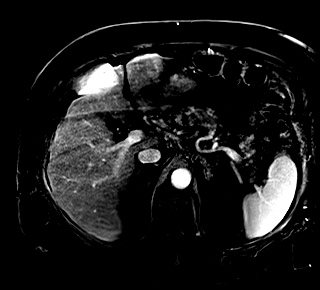
[im 72/72]
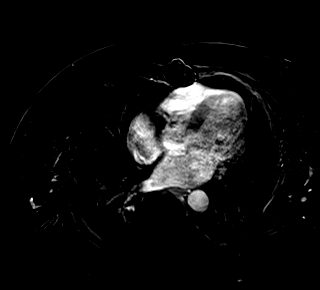

[Series 17: T1 dynamic post-contrast · coronal · 3.0mm · 1.25mm/px · 4 of 96 slices shown (7 of 9)]
[im 1/96]
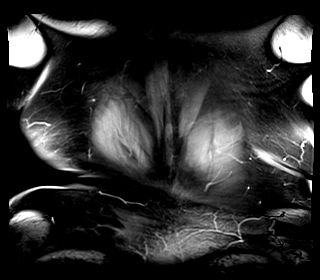
[im 32/96]
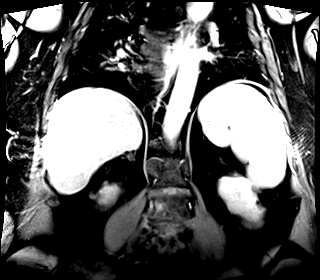
[im 64/96]
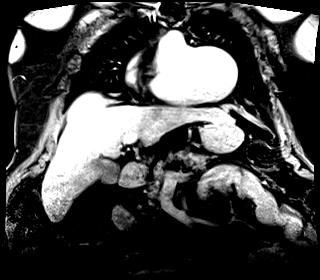
[im 96/96]
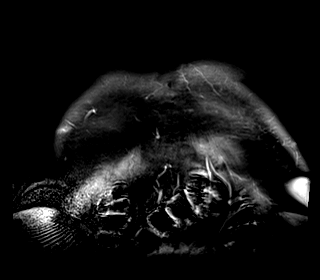

[Series 18: T1 dynamic post-contrast · axial · 3.0mm · 1.25mm/px · z∈[-85,+128]mm · 3 of 72 slices shown (8 of 9)]
[im 1/72]
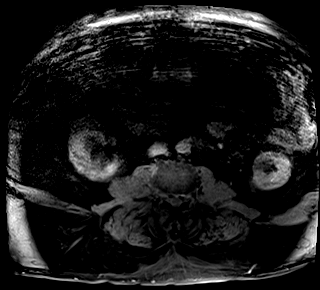
[im 36/72]
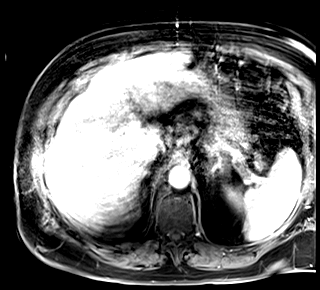
[im 72/72]
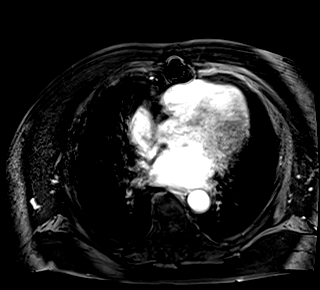

[Series 19: T1 dynamic post-contrast · axial · 3.0mm · 1.25mm/px · z∈[-85,+128]mm · 3 of 72 slices shown (9 of 9)]
[im 1/72]
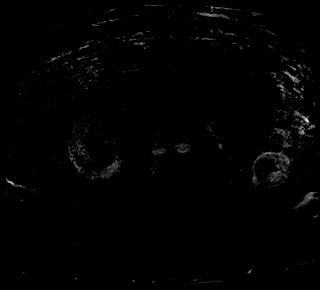
[im 36/72]
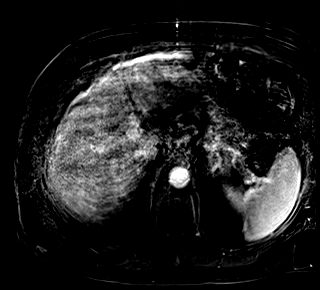
[im 72/72]
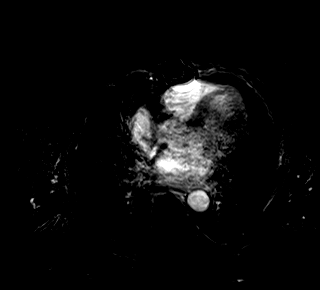

[48 of 48 positions shown; findings below may reference images not displayed]

FINDINGS: Lower chest: Lung bases are clear.

Hepatobiliary: Cirrhosis. 4.1 x 6.0 cm proteinaceous/hemorrhagic
cystic lesion along the anterior aspect of segment 4B (series
4/image 43), without convincing enhancement on postcontrast
subtraction imaging, favoring a benign hemorrhagic cyst. This is
grossly unchanged from [DATE].

No suspicious enhancing hepatic lesions.  No hepatic steatosis.

Gallbladder is unremarkable. No intrahepatic or extrahepatic ductal
dilatation.

Pancreas:  Within normal limits.

Spleen: Mildly enlarged, measuring 14.1 cm in maximal craniocaudal
dimension.

Adrenals/Urinary Tract:  Adrenal glands are within normal limits.

Bilateral renal cysts, measuring up to 4.5 cm in the anterior left
upper kidney, benign (Bosniak I). No enhancing renal lesions. No
hydronephrosis.

Stomach/Bowel: Stomach is within normal limits.

Visualized bowel is unremarkable.

Vascular/Lymphatic:  No evidence of abdominal aortic aneurysm.

Portal vein is patent. Gastroesophageal varices. Retroaortic left
renal vein.

No suspicious abdominal lymphadenopathy.

Other:  Trace perihepatic ascites.

Musculoskeletal: Degenerative changes of the visualized
thoracolumbar spine.
IMPRESSION: 6.0 cm proteinaceous/hemorrhagic cystic lesion along the anterior
aspect of segment 4B, unchanged from [DATE], compatible with a
benign hemorrhagic cyst.

Cirrhosis. Mild splenomegaly. Portal vein is patent.
Gastroesophageal varices. Trace perihepatic ascites.

## 2019-10-24 MED ORDER — GADOBENATE DIMEGLUMINE 529 MG/ML IV SOLN
20.0000 mL | Freq: Once | INTRAVENOUS | Status: AC | PRN
  Administered 2019-10-24: 20 mL via INTRAVENOUS

## 2019-11-08 ENCOUNTER — Ambulatory Visit: Payer: Medicare Other | Admitting: Physician Assistant

## 2019-11-08 ENCOUNTER — Encounter: Payer: Self-pay | Admitting: Physician Assistant

## 2019-11-08 VITALS — BP 134/80 | HR 92 | Temp 98.1°F | Ht 65.0 in | Wt 286.2 lb

## 2019-11-08 DIAGNOSIS — K766 Portal hypertension: Secondary | ICD-10-CM | POA: Diagnosis not present

## 2019-11-08 DIAGNOSIS — D649 Anemia, unspecified: Secondary | ICD-10-CM | POA: Diagnosis not present

## 2019-11-08 DIAGNOSIS — R609 Edema, unspecified: Secondary | ICD-10-CM | POA: Diagnosis not present

## 2019-11-08 DIAGNOSIS — K7460 Unspecified cirrhosis of liver: Secondary | ICD-10-CM

## 2019-11-08 DIAGNOSIS — K3189 Other diseases of stomach and duodenum: Secondary | ICD-10-CM

## 2019-11-08 MED ORDER — SPIRONOLACTONE 50 MG PO TABS: 50.0000 mg | ORAL_TABLET | Freq: Every day | ORAL | 8 refills | Status: DC

## 2019-11-08 MED ORDER — PANTOPRAZOLE SODIUM 40 MG PO TBEC: 40.0000 mg | DELAYED_RELEASE_TABLET | Freq: Every day | ORAL | 11 refills | Status: DC

## 2019-11-08 NOTE — Progress Notes (Signed)
Subjective:    Patient ID: Brandon [name], male    DOB: 1946-06-12, 74 y.o.   MRN: [id number]  HPI  Brandon [name] is a 74 year old white male, known to Dr. Hilarie Fredrickson, who comes in today for [name] hospital follow-up after recent admission 3/7 through 10/06/2019 with GI bleeding. He has history of coronary artery disease, had been on Xarelto and aspirin.  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 He required 3 units of packed RBCs during his admission and received IV iron x1. He had EGD on 10/04/2019 which showed significant portal gastropathy with oozing and bleeding to touch.  He also underwent colonoscopy in February 2021 during prior hospitalization at which time he also had some bleeding, and was found to have scattered diverticulosis, prior end-to-end ileocolonic anastomosis and had 2 small polyps removed which were tubular adenomas. Decision was made to hold aspirin and Xarelto on discharge and to continue off anticoagulation if okay with his cardiologist. He sees Dr. Tyson/cardiology and had visit with him on 10/27/2019 Per his notes plan was to leave off Xarelto, and to consider a watchman device implantation.  Patient says he is considering this. Patient says he is doing well since discharge from the hospital, his stools have been brown.  His energy level is about the same, he has chronic dyspnea with exertion. He is also been followed up by his PCP who he says did labs last week and told him his hemoglobin had improved.  Most recent result per [facility] on 10/14/2019 hemoglobin 10.7 hematocrit of 35.6 platelets 241.  He also had an MRI of the liver on 10/24/2019 with finding of a 6 cm x 4.1 cm proteinaceous/hemorrhagic cystic lesion favoring a benign hemorrhagic cyst.  He is on Lasix 20 mg p.o. daily, Aldactone 50 mg daily, and pantoprazole 40 mg p.o. every  morning.   Review of Systems Pertinent positive and negative review of systems were noted in the above HPI section.  All other review of systems was otherwise negative.  Outpatient Encounter Medications as of 11/08/2019  Medication Sig  . allopurinol (ZYLOPRIM) 300 MG tablet Take 300 mg by mouth at bedtime.   . carvedilol (COREG) 6.25 MG tablet Take 6.25 mg by mouth 2 (two) times daily.  . cholecalciferol (VITAMIN D3) 25 MCG (1000 UT) tablet Take 2 tablets (2,000 Units total) by mouth 2 (two) times daily. (Patient taking differently: Take 2,000 Units by mouth daily. )  . ferrous sulfate 325 (65 FE) MG tablet Take 325 mg by mouth daily.  . finasteride (PROSCAR) 5 MG tablet Take 5 mg by mouth daily.  . folic acid (FOLVITE) 1 MG tablet Take 1 tablet (1 mg total) by mouth daily.  . furosemide (LASIX) 20 MG tablet Take 20 mg by mouth daily.  . metFORMIN (GLUCOPHAGE-XR) 500 MG 24 hr tablet Take 500 mg by mouth daily with breakfast.   . Multiple Vitamin (MULTIVITAMIN WITH MINERALS) TABS tablet Take 1 tablet by mouth daily.  . pantoprazole (PROTONIX) 40 MG tablet Take 1 tablet (40 mg total) by mouth daily.  . polyethylene glycol (MIRALAX / GLYCOLAX) packet Take 17 g by mouth 2 (two) times daily. (Patient taking differently: Take 17 g by mouth daily as needed for mild constipation. Mix in coffee and drink)  . potassium chloride (KLOR-CON) 10 MEQ tablet Take 10 mEq by mouth daily.  . pravastatin (PRAVACHOL) 10 MG  tablet Take 10 mg by mouth at bedtime.  . senna-docusate (SENOKOT-S) 8.6-50 MG tablet Take 1 tablet by mouth 2 (two) times daily. (Patient taking differently: Take 1 tablet by mouth every other day. )  . spironolactone (ALDACTONE) 50 MG tablet Take 1 tablet (50 mg total) by mouth daily.  . tamsulosin (FLOMAX) 0.4 MG CAPS capsule Take 0.4 mg by mouth at bedtime.  . [DISCONTINUED] pantoprazole (PROTONIX) 40 MG tablet Take 1 tablet (40 mg total) by mouth 2 (two) times daily.  . [DISCONTINUED]  spironolactone (ALDACTONE) 50 MG tablet Take 50 mg by mouth daily.  . [DISCONTINUED] amiodarone (PACERONE) 200 MG tablet Take 200 mg by mouth daily.  . [DISCONTINUED] carvedilol (COREG) 3.125 MG tablet Take 1 tablet (3.125 mg total) by mouth 2 (two) times daily with a meal.   No facility-administered encounter medications on file as of 11/08/2019.   No Known Allergies Patient Active Problem List   Diagnosis Date Noted  . Alcoholic cirrhosis of liver with ascites ([place])   . Portal hypertensive gastropathy ([place])   . Melena 10/02/2019  . Angiodysplasia of stomach with hemorrhage   . Gastrointestinal hemorrhage   . Atrial fibrillation ([place])   . Chronic pain syndrome   . Symptomatic anemia 08/13/2019  . Acute upper GI bleed 08/13/2019  . Avulsion of right patellar tendon   . Closed displaced fracture of phalanx of toe of right foot 10/06/2018  . 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 Patella fracture, extensor mechanism disruption R knee  09/29/2018  . Hyponatremia 08/23/2018  . Obstipation 08/23/2018  . History of colon cancer 08/23/2018  . CAD (coronary artery disease) 08/23/2018  . DM (diabetes mellitus), type 2 ([place]) 08/23/2018  . Pyuria 08/23/2018   Social History   Socioeconomic History  . Marital status: Widowed    Spouse name: Not on file  . Number of children: Not on file  . Years of education: Not on file  . Highest education level: Not on file  Occupational History  . Not on file  Tobacco Use  . Smoking status: Former Smoker    Types: Cigarettes    Quit date: 1990    Years since quitting: 31.3  . Smokeless tobacco: Never Used  Substance and Sexual Activity  . Alcohol use: Yes  . Drug use: Never  . Sexual activity: Not Currently  Other Topics Concern  . Not on file  Social History Narrative  . Not on file   Social Determinants of Health   Financial Resource Strain:   . Difficulty of Paying Living Expenses:   Food Insecurity:   . Worried About Sales executive in the Last Year:   . Arboriculturist in the Last Year:   Transportation Needs:   . Film/video editor (Medical):   Marland Kitchen Lack of Transportation (Non-Medical):   Physical Activity:   . Days of Exercise per Week:   . Minutes of Exercise per Session:   Stress:   . Feeling of Stress :   Social Connections:   . Frequency of Communication with Friends and Family:   . Frequency of Social Gatherings with Friends and Family:   . Attends Religious Services:   . Active Member of Clubs or Organizations:   . Attends Archivist Meetings:   Marland Kitchen Marital Status:   Intimate Partner Violence:   . Fear of Current or Ex-Partner:   . Emotionally Abused:   Marland Kitchen Physically Abused:   . Sexually Abused:  Mr. Teague family history includes CAD in an other family member; Colon cancer in his father, paternal uncle, and paternal uncle.      Objective:    Vitals:   11/08/19 1501  BP: 134/80  Pulse: 92  Temp: 98.1 F (36.7 C)    Physical Exam Well-developed well-nourished older white male in no acute distress Ambulates with a walker.  Height, Weight, 286 BMI 47.6  HEENT; nontraumatic normocephalic, EOMI, PER R LA, sclera anicteric. Oropharynx; not examined today Neck; supple, no JVD Cardiovascular; regular rate and rhythm with S1-S2, no murmur rub or gallop Pulmonary; some decrease in breath sounds bilateral bases Abdomen; soft, morbidly obese, nondistended, nontender, no palpable mass or hepatosplenomegaly, bowel sounds are active Rectal; not done today Skin; benign exam, no jaundice rash or appreciable lesions Extremities; no clubbing cyanosis , trace edema bilateral ankles, chronic vascualr changes, tips of toes on the left Neuro/Psych; alert and oriented x4, grossly nonfocal mood and affect appropriate       Assessment & Plan:   #40 74 year old white male with decompensated NASH/EtOH induced cirrhosis with history of ascites, peripheral edema and portal gastropathy. Patient  has had a couple of admissions recently with anemia and low-grade bleeding. This Had Been in the Setting of Xarelto and Aspirin Which Have Now Been Discontinued.  Most recent EGD with significant diffuse portal gastropathy with oozing felt to be source of his bleeding.  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  History of adenomatous colon polyps-up-to-date with colonoscopy just done February 2021 5.  Diverticulosis 6.  Coronary artery disease 7.  Morbid obesity 8.  Adult onset diabetes mellitus   Plan; plan is for patient to remain off of anticoagulation for now, he is followed by [facility] cardiology / [facility]  Continue serial hemoglobins every 2 to 3 weeks.  This is being followed by his PCP. Continue pantoprazole 40 mg p.o. every morning long-term Have refilled Aldactone 50 mg p.o. every morning Continue Lasix 20 mg p.o. every morning Continue ferrous sulfate 325 mg p.o. daily Follow-up with Dr. Hilarie Fredrickson in 6 months or sooner as neede, last check at 10.7   Tatia Petrucci S Janari Gagner PA-C 11/08/2019   Cc: Helane Rima, MD

## 2019-11-08 NOTE — Patient Instructions (Signed)
If you are age 73 or older, your body mass index should be between 23-30. Your Body mass index is 47.63 kg/m. If this is out of the aforementioned range listed, please consider follow up with your Primary Care Provider.  If you are age 14 or younger, your body mass index should be between 19-25. Your Body mass index is 47.63 kg/m. If this is out of the aformentioned range listed, please consider follow up with your Primary Care Provider.   Refills of your Pantoprazole and Spirolactone have been sent to your pharmacy.  Follow up in 6 months with Dr. Hilarie Fredrickson.

## 2019-11-14 NOTE — Progress Notes (Signed)
Addendum: Reviewed and agree with assessment and management plan. Nili Honda M, MD  

## 2020-02-06 ENCOUNTER — Telehealth: Payer: Self-pay | Admitting: Physician Assistant

## 2020-02-06 NOTE — Telephone Encounter (Signed)
Called [facility], they wanted to confirm that we were aware that the patient was on both potassium and Spironolactone.

## 2020-02-06 NOTE — Telephone Encounter (Signed)
[facility] is calling, stating they want to verify some medication that patient takes. States they were trying to refill a medication from [place] but was told patient was taking potassium.

## 2020-05-25 ENCOUNTER — Encounter (HOSPITAL_COMMUNITY): Payer: Self-pay | Admitting: Internal Medicine

## 2020-05-25 ENCOUNTER — Inpatient Hospital Stay (HOSPITAL_COMMUNITY)
Admission: EM | Admit: 2020-05-25 | Discharge: 2020-06-04 | DRG: 595 | Disposition: A | Discharge status: Home Health | Payer: Medicare Other | Attending: Student in an Organized Health Care Education/Training Program | Admitting: Student in an Organized Health Care Education/Training Program

## 2020-05-25 ENCOUNTER — Other Ambulatory Visit: Payer: Self-pay

## 2020-05-25 ENCOUNTER — Emergency Department (HOSPITAL_COMMUNITY): Payer: Medicare Other

## 2020-05-25 DIAGNOSIS — I2510 Atherosclerotic heart disease of native coronary artery without angina pectoris: Secondary | ICD-10-CM | POA: Diagnosis present

## 2020-05-25 DIAGNOSIS — Z7901 Long term (current) use of anticoagulants: Secondary | ICD-10-CM | POA: Diagnosis not present

## 2020-05-25 DIAGNOSIS — R338 Other retention of urine: Secondary | ICD-10-CM | POA: Diagnosis present

## 2020-05-25 DIAGNOSIS — I5031 Acute diastolic (congestive) heart failure: Secondary | ICD-10-CM | POA: Diagnosis not present

## 2020-05-25 DIAGNOSIS — Z8249 Family history of ischemic heart disease and other diseases of the circulatory system: Secondary | ICD-10-CM

## 2020-05-25 DIAGNOSIS — Z6841 Body Mass Index (BMI) 40.0 and over, adult: Secondary | ICD-10-CM

## 2020-05-25 DIAGNOSIS — Z66 Do not resuscitate: Secondary | ICD-10-CM | POA: Diagnosis present

## 2020-05-25 DIAGNOSIS — Z20822 Contact with and (suspected) exposure to covid-19: Secondary | ICD-10-CM | POA: Diagnosis present

## 2020-05-25 DIAGNOSIS — Z800 Family history of malignant neoplasm of digestive organs: Secondary | ICD-10-CM | POA: Diagnosis not present

## 2020-05-25 DIAGNOSIS — R601 Generalized edema: Secondary | ICD-10-CM

## 2020-05-25 DIAGNOSIS — T50905A Adverse effect of unspecified drugs, medicaments and biological substances, initial encounter: Secondary | ICD-10-CM | POA: Diagnosis present

## 2020-05-25 DIAGNOSIS — N179 Acute kidney failure, unspecified: Secondary | ICD-10-CM | POA: Diagnosis not present

## 2020-05-25 DIAGNOSIS — Z8719 Personal history of other diseases of the digestive system: Secondary | ICD-10-CM

## 2020-05-25 DIAGNOSIS — T68XXXA Hypothermia, initial encounter: Secondary | ICD-10-CM | POA: Diagnosis not present

## 2020-05-25 DIAGNOSIS — I480 Paroxysmal atrial fibrillation: Secondary | ICD-10-CM | POA: Diagnosis present

## 2020-05-25 DIAGNOSIS — K7031 Alcoholic cirrhosis of liver with ascites: Secondary | ICD-10-CM | POA: Diagnosis present

## 2020-05-25 DIAGNOSIS — Z85038 Personal history of other malignant neoplasm of large intestine: Secondary | ICD-10-CM | POA: Diagnosis not present

## 2020-05-25 DIAGNOSIS — E875 Hyperkalemia: Secondary | ICD-10-CM

## 2020-05-25 DIAGNOSIS — E119 Type 2 diabetes mellitus without complications: Secondary | ICD-10-CM | POA: Diagnosis present

## 2020-05-25 DIAGNOSIS — Z87891 Personal history of nicotine dependence: Secondary | ICD-10-CM

## 2020-05-25 DIAGNOSIS — Z9049 Acquired absence of other specified parts of digestive tract: Secondary | ICD-10-CM | POA: Diagnosis not present

## 2020-05-25 DIAGNOSIS — Z23 Encounter for immunization: Secondary | ICD-10-CM | POA: Diagnosis present

## 2020-05-25 DIAGNOSIS — R680 Hypothermia, not associated with low environmental temperature: Secondary | ICD-10-CM | POA: Diagnosis not present

## 2020-05-25 DIAGNOSIS — Z7984 Long term (current) use of oral hypoglycemic drugs: Secondary | ICD-10-CM

## 2020-05-25 DIAGNOSIS — I509 Heart failure, unspecified: Secondary | ICD-10-CM

## 2020-05-25 DIAGNOSIS — N401 Enlarged prostate with lower urinary tract symptoms: Secondary | ICD-10-CM | POA: Diagnosis present

## 2020-05-25 DIAGNOSIS — L120 Bullous pemphigoid: Secondary | ICD-10-CM | POA: Diagnosis present

## 2020-05-25 DIAGNOSIS — K766 Portal hypertension: Secondary | ICD-10-CM | POA: Diagnosis present

## 2020-05-25 DIAGNOSIS — F1021 Alcohol dependence, in remission: Secondary | ICD-10-CM | POA: Diagnosis present

## 2020-05-25 DIAGNOSIS — Z8614 Personal history of Methicillin resistant Staphylococcus aureus infection: Secondary | ICD-10-CM

## 2020-05-25 DIAGNOSIS — I5033 Acute on chronic diastolic (congestive) heart failure: Secondary | ICD-10-CM | POA: Diagnosis present

## 2020-05-25 DIAGNOSIS — I4891 Unspecified atrial fibrillation: Secondary | ICD-10-CM | POA: Diagnosis present

## 2020-05-25 DIAGNOSIS — K3189 Other diseases of stomach and duodenum: Secondary | ICD-10-CM | POA: Diagnosis present

## 2020-05-25 DIAGNOSIS — R21 Rash and other nonspecific skin eruption: Secondary | ICD-10-CM | POA: Diagnosis present

## 2020-05-25 DIAGNOSIS — D7212 Drug rash with eosinophilia and systemic symptoms syndrome: Secondary | ICD-10-CM | POA: Diagnosis not present

## 2020-05-25 DIAGNOSIS — M109 Gout, unspecified: Secondary | ICD-10-CM | POA: Diagnosis present

## 2020-05-25 DIAGNOSIS — Z951 Presence of aortocoronary bypass graft: Secondary | ICD-10-CM

## 2020-05-25 LAB — CBC WITH DIFFERENTIAL/PLATELET
Abs Immature Granulocytes: 0 10*3/uL (ref 0.00–0.07)
Basophils Absolute: 0.1 10*3/uL (ref 0.0–0.1)
Basophils Relative: 1 %
Eosinophils Absolute: 6.7 10*3/uL — ABNORMAL HIGH (ref 0.0–0.5)
Eosinophils Relative: 49 %
HCT: 41.8 % (ref 39.0–52.0)
Hemoglobin: 12.3 g/dL — ABNORMAL LOW (ref 13.0–17.0)
Lymphocytes Relative: 9 %
Lymphs Abs: 1.2 10*3/uL (ref 0.7–4.0)
MCH: 28.2 pg (ref 26.0–34.0)
MCHC: 29.4 g/dL — ABNORMAL LOW (ref 30.0–36.0)
MCV: 95.9 fL (ref 80.0–100.0)
Monocytes Absolute: 0 10*3/uL — ABNORMAL LOW (ref 0.1–1.0)
Monocytes Relative: 0 %
Neutro Abs: 5.6 10*3/uL (ref 1.7–7.7)
Neutrophils Relative %: 41 %
Platelets: 203 10*3/uL (ref 150–400)
RBC: 4.36 MIL/uL (ref 4.22–5.81)
RDW: 16.8 % — ABNORMAL HIGH (ref 11.5–15.5)
WBC: 13.6 10*3/uL — ABNORMAL HIGH (ref 4.0–10.5)
nRBC: 0 % (ref 0.0–0.2)
nRBC: 0 /100 WBC

## 2020-05-25 LAB — URINALYSIS, ROUTINE W REFLEX MICROSCOPIC
Bilirubin Urine: NEGATIVE
Glucose, UA: NEGATIVE mg/dL
Ketones, ur: NEGATIVE mg/dL
Nitrite: NEGATIVE
Protein, ur: NEGATIVE mg/dL
Specific Gravity, Urine: 1.01 (ref 1.005–1.030)
WBC, UA: >50 WBC/hpf — ABNORMAL HIGH (ref 0–5)
pH: 6 (ref 5.0–8.0)

## 2020-05-25 LAB — COMPREHENSIVE METABOLIC PANEL
ALT: 22 U/L (ref 0–44)
AST: 27 U/L (ref 15–41)
Albumin: 2.8 g/dL — ABNORMAL LOW (ref 3.5–5.0)
Alkaline Phosphatase: 106 U/L (ref 38–126)
Anion gap: 11 (ref 5–15)
BUN: 21 mg/dL (ref 8–23)
CO2: 24 mmol/L (ref 22–32)
Calcium: 9.2 mg/dL (ref 8.9–10.3)
Chloride: 102 mmol/L (ref 98–111)
Creatinine, Ser: 1.23 mg/dL (ref 0.61–1.24)
GFR, Estimated: >60 mL/min (ref >60)
Glucose, Bld: 111 mg/dL — ABNORMAL HIGH (ref 70–99)
Potassium: 4.7 mmol/L (ref 3.5–5.1)
Sodium: 137 mmol/L (ref 135–145)
Total Bilirubin: 0.6 mg/dL (ref 0.3–1.2)
Total Protein: 6 g/dL — ABNORMAL LOW (ref 6.5–8.1)

## 2020-05-25 LAB — RESPIRATORY PANEL BY RT PCR (FLU A&B, COVID)
Influenza A by PCR: NEGATIVE
Influenza B by PCR: NEGATIVE
SARS Coronavirus 2 by RT PCR: NEGATIVE

## 2020-05-25 LAB — GLUCOSE, CAPILLARY: Glucose-Capillary: 116 mg/dL — ABNORMAL HIGH (ref 70–99)

## 2020-05-25 LAB — BRAIN NATRIURETIC PEPTIDE: B Natriuretic Peptide: 336.4 pg/mL — ABNORMAL HIGH (ref 0.0–100.0)

## 2020-05-25 LAB — TROPONIN I (HIGH SENSITIVITY): Troponin I (High Sensitivity): 8 ng/L (ref <18)

## 2020-05-25 IMAGING — DX DG CHEST 1V PORT
1 series · 1 of 1 positions shown · non-contrast
Comparison: [DATE].

CLINICAL DATA: Shortness of breath.

EXAM:
PORTABLE CHEST 1 VIEW

[chest ap]
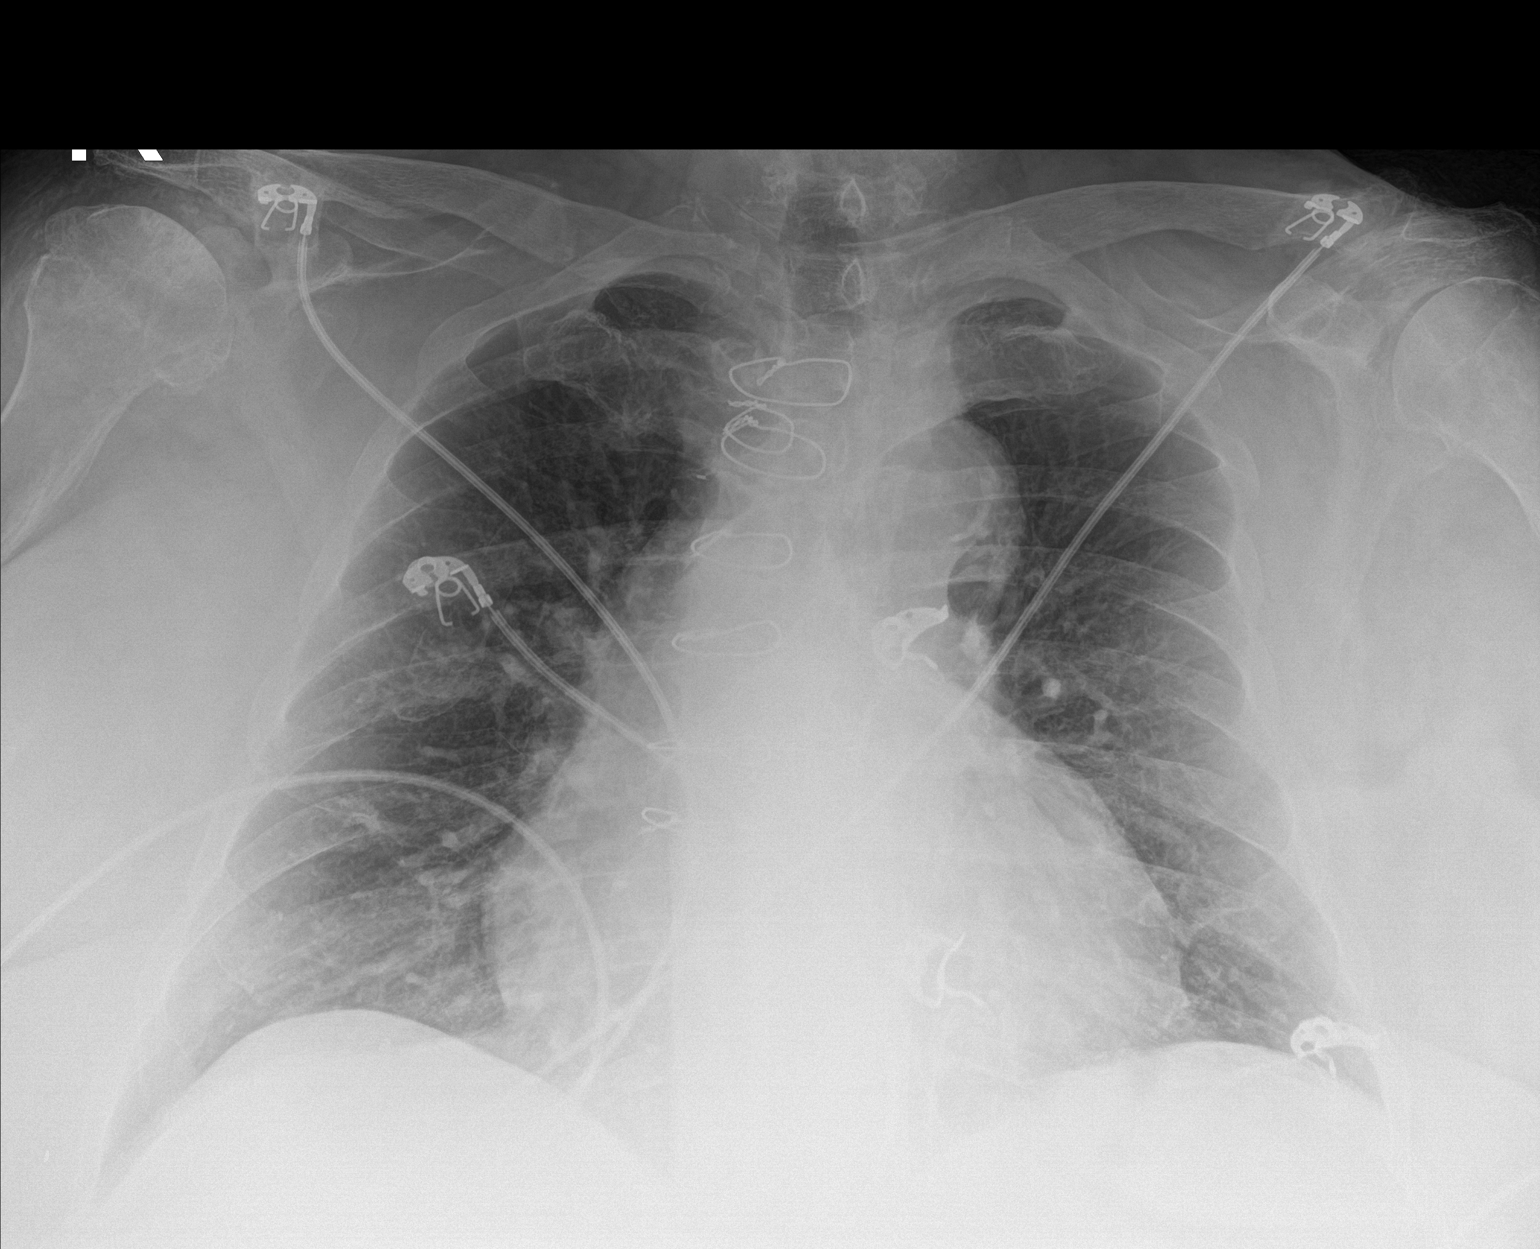

[1 of 1 positions shown; findings below may reference images not displayed]

FINDINGS: Enlarged cardiac silhouette. Prior CABG. Aortic atherosclerosis. No
focal consolidation. No visible pleural effusions or pneumothorax on
this single portable semi erect radiograph. Chronic right shoulder
changes.
IMPRESSION: 1. No acute cardiopulmonary disease.
2. Chronic cardiomegaly.

## 2020-05-25 MED ORDER — FINASTERIDE 5 MG PO TABS
5.0000 mg | ORAL_TABLET | Freq: Every day | ORAL | Status: DC
  Administered 2020-05-26 – 2020-06-04 (×10): 5 mg via ORAL
  Filled 2020-05-25 (×10): qty 1

## 2020-05-25 MED ORDER — TRIAMCINOLONE ACETONIDE 0.5 % EX CREA
1.0000 "application " | TOPICAL_CREAM | Freq: Every day | CUTANEOUS | Status: DC
  Administered 2020-05-26 – 2020-06-03 (×7): 1 via TOPICAL
  Filled 2020-05-25 (×3): qty 15

## 2020-05-25 MED ORDER — PRAVASTATIN SODIUM 10 MG PO TABS
10.0000 mg | ORAL_TABLET | Freq: Every day | ORAL | Status: DC
  Administered 2020-05-25 – 2020-06-03 (×10): 10 mg via ORAL
  Filled 2020-05-25 (×10): qty 1

## 2020-05-25 MED ORDER — FERROUS SULFATE 325 (65 FE) MG PO TABS
325.0000 mg | ORAL_TABLET | Freq: Every day | ORAL | Status: DC
  Administered 2020-05-26 – 2020-05-28 (×3): 325 mg via ORAL
  Filled 2020-05-25 (×3): qty 1

## 2020-05-25 MED ORDER — SODIUM CHLORIDE 0.9% FLUSH: 3.0000 mL | INTRAVENOUS | Status: DC | PRN

## 2020-05-25 MED ORDER — POTASSIUM CHLORIDE CRYS ER 10 MEQ PO TBCR
10.0000 meq | EXTENDED_RELEASE_TABLET | Freq: Every day | ORAL | Status: DC
  Administered 2020-05-26 – 2020-05-28 (×3): 10 meq via ORAL
  Filled 2020-05-25 (×3): qty 1

## 2020-05-25 MED ORDER — ADULT MULTIVITAMIN W/MINERALS CH
1.0000 | ORAL_TABLET | Freq: Every day | ORAL | Status: DC
  Administered 2020-05-26 – 2020-05-28 (×3): 1 via ORAL
  Filled 2020-05-25 (×3): qty 1

## 2020-05-25 MED ORDER — VITAMIN D 25 MCG (1000 UNIT) PO TABS
2000.0000 [IU] | ORAL_TABLET | Freq: Every day | ORAL | Status: DC
  Administered 2020-05-26 – 2020-05-28 (×3): 2000 [IU] via ORAL
  Filled 2020-05-25 (×3): qty 2

## 2020-05-25 MED ORDER — PANTOPRAZOLE SODIUM 40 MG PO TBEC
40.0000 mg | DELAYED_RELEASE_TABLET | Freq: Every day | ORAL | Status: DC
  Administered 2020-05-26 – 2020-05-28 (×3): 40 mg via ORAL
  Filled 2020-05-25 (×3): qty 1

## 2020-05-25 MED ORDER — SODIUM CHLORIDE 0.9 % IV SOLN: 250.0000 mL | INTRAVENOUS | Status: DC | PRN

## 2020-05-25 MED ORDER — ONDANSETRON HCL 4 MG/2ML IJ SOLN: 4.0000 mg | Freq: Four times a day (QID) | INTRAMUSCULAR | Status: DC | PRN

## 2020-05-25 MED ORDER — FUROSEMIDE 10 MG/ML IJ SOLN
60.0000 mg | Freq: Once | INTRAMUSCULAR | Status: AC
  Administered 2020-05-25: 60 mg via INTRAVENOUS
  Filled 2020-05-25: qty 6

## 2020-05-25 MED ORDER — CARVEDILOL 6.25 MG PO TABS
6.2500 mg | ORAL_TABLET | Freq: Two times a day (BID) | ORAL | Status: DC
  Administered 2020-05-25 – 2020-06-04 (×20): 6.25 mg via ORAL
  Filled 2020-05-25 (×20): qty 1

## 2020-05-25 MED ORDER — SPIRONOLACTONE 25 MG PO TABS
50.0000 mg | ORAL_TABLET | Freq: Every day | ORAL | Status: DC
  Administered 2020-05-26 – 2020-05-28 (×3): 50 mg via ORAL
  Filled 2020-05-25 (×3): qty 2

## 2020-05-25 MED ORDER — POLYETHYLENE GLYCOL 3350 17 G PO PACK
17.0000 g | PACK | Freq: Every day | ORAL | Status: DC | PRN
  Administered 2020-05-31 – 2020-06-01 (×2): 17 g via ORAL
  Filled 2020-05-25 (×2): qty 1

## 2020-05-25 MED ORDER — TAMSULOSIN HCL 0.4 MG PO CAPS
0.4000 mg | ORAL_CAPSULE | Freq: Every day | ORAL | Status: DC
  Administered 2020-05-25 – 2020-06-03 (×10): 0.4 mg via ORAL
  Filled 2020-05-25 (×10): qty 1

## 2020-05-25 MED ORDER — INSULIN ASPART 100 UNIT/ML ~~LOC~~ SOLN
0.0000 [IU] | Freq: Three times a day (TID) | SUBCUTANEOUS | Status: DC
  Administered 2020-05-26: 1 [IU] via SUBCUTANEOUS
  Administered 2020-05-27: 2 [IU] via SUBCUTANEOUS
  Administered 2020-05-27 – 2020-05-28 (×2): 1 [IU] via SUBCUTANEOUS
  Administered 2020-05-28: 2 [IU] via SUBCUTANEOUS
  Administered 2020-05-28: 1 [IU] via SUBCUTANEOUS
  Administered 2020-05-29: 2 [IU] via SUBCUTANEOUS
  Administered 2020-05-29: 1 [IU] via SUBCUTANEOUS
  Administered 2020-05-30 – 2020-05-31 (×3): 2 [IU] via SUBCUTANEOUS
  Administered 2020-05-31 – 2020-06-01 (×2): 1 [IU] via SUBCUTANEOUS
  Administered 2020-06-01: 5 [IU] via SUBCUTANEOUS
  Administered 2020-06-02: 1 [IU] via SUBCUTANEOUS
  Administered 2020-06-03: 2 [IU] via SUBCUTANEOUS
  Administered 2020-06-03: 1 [IU] via SUBCUTANEOUS
  Administered 2020-06-03: 2 [IU] via SUBCUTANEOUS
  Administered 2020-06-04: 1 [IU] via SUBCUTANEOUS
  Administered 2020-06-04: 3 [IU] via SUBCUTANEOUS

## 2020-05-25 MED ORDER — FUROSEMIDE 10 MG/ML IJ SOLN
60.0000 mg | Freq: Two times a day (BID) | INTRAMUSCULAR | Status: DC
  Administered 2020-05-26 – 2020-05-28 (×5): 60 mg via INTRAVENOUS
  Filled 2020-05-25 (×5): qty 6

## 2020-05-25 MED ORDER — SENNOSIDES-DOCUSATE SODIUM 8.6-50 MG PO TABS
1.0000 | ORAL_TABLET | Freq: Every day | ORAL | Status: DC
  Administered 2020-05-26 – 2020-05-28 (×3): 1 via ORAL
  Filled 2020-05-25 (×3): qty 1

## 2020-05-25 MED ORDER — ALLOPURINOL 300 MG PO TABS
300.0000 mg | ORAL_TABLET | Freq: Every day | ORAL | Status: DC
  Administered 2020-05-25: 300 mg via ORAL
  Filled 2020-05-25 (×2): qty 1

## 2020-05-25 MED ORDER — FOLIC ACID 1 MG PO TABS
1.0000 mg | ORAL_TABLET | Freq: Every day | ORAL | Status: DC
  Administered 2020-05-26 – 2020-05-28 (×3): 1 mg via ORAL
  Filled 2020-05-25 (×3): qty 1

## 2020-05-25 MED ORDER — SODIUM CHLORIDE 0.9% FLUSH
3.0000 mL | Freq: Two times a day (BID) | INTRAVENOUS | Status: DC
  Administered 2020-05-26 – 2020-06-04 (×17): 3 mL via INTRAVENOUS

## 2020-05-25 MED ORDER — HYDROXYZINE HCL 25 MG PO TABS
25.0000 mg | ORAL_TABLET | Freq: Every day | ORAL | Status: DC | PRN
  Administered 2020-05-25 – 2020-05-26 (×2): 25 mg via ORAL
  Filled 2020-05-25 (×3): qty 1

## 2020-05-25 MED ORDER — ACETAMINOPHEN 325 MG PO TABS
650.0000 mg | ORAL_TABLET | ORAL | Status: DC | PRN
  Administered 2020-05-27 – 2020-06-03 (×8): 650 mg via ORAL
  Filled 2020-05-25 (×9): qty 2

## 2020-05-25 NOTE — ED Triage Notes (Signed)
BIB [facility] after pt called to report increase weakness x 2 weeks and sHOB with exertion. Pt also c/o of worsening generalized rash that pt has had since 2007 ( and has received tx). Pt has hx of afib, CHF.  HR 82 RR 19 Temp 97.4 oral B/P 138/76

## 2020-05-25 NOTE — ED Notes (Signed)
Attempted to call report, receiving RN unavailable at this time.  

## 2020-05-25 NOTE — H&P (Addendum)
Date: 05/25/2020               Patient Name:  Brandon [name] MRN: [id number]  DOB: Jul 21, 1946 Age / Sex: 74 y.o., male   PCP: Helane Rima, MD         Medical Service: Internal Medicine Teaching Service         Attending Physician: Dr. Sid Falcon, MD    First Contact: Dr. Candie Chroman Pager: [phone]  Second Contact: Dr. Court Joy Pager: [phone]       After Hours (After 5p/  First Contact Pager: [phone]  weekends / holidays): Second Contact Pager: [phone]   Chief Complaint: leg swelling and exertional dyspnea  History of Present Illness: Brandon [name] is a 74 year old male with past medical history of cirrhosis, T2DM, gastric avm , atrial fibrillation ( off anticoagulation because of avm?) , EtOH use disorder 3 years of sobriety, BPH and esophageal varices.  Patient reports that over the past 5 days he has had increased swelling in his extremities. He reports that he has been taking 20mg  BID Lasix for the past year. He has taken Lasix for numerous years and it was decreased to 20mg  last year when he was ill. Patient reports that his Lasix was increased to 40mg  BID this week due to his increase fluid retention. Patient also reports DOE but no orthopnea. Per patient he continues to have a good appetite and after his Lasix was increased his urine output increased. Patient reports that he has regular bowel movements. Patient reports that his abdomen feels a bit tight as well, but is not causing as much discomfort as his abdomen. Patient also reports that he is very uncomfortable in edition to his increased edema in the extremities his also has a rash outbreak that started 2 weeks ago. Patient reports that he normally get a pruritic vesicular rash around this time every year. The first time he had the rash he was living in his home state of [place] and was recovering from a MRSA infection and surgical procedure to remove bacteria from his knee. Patient has had this rash yearly since 2007 and report  that it often resolves with steroid cream by the end of the year.      Patient is Covid-19 vaccinated.  Meds:  Current Meds  Medication Sig  . acetaminophen (TYLENOL) 500 MG tablet Take 500 mg by mouth every 6 (six) hours as needed for moderate pain.  Marland Kitchen allopurinol (ZYLOPRIM) 300 MG tablet Take 300 mg by mouth at bedtime.   . carvedilol (COREG) 6.25 MG tablet Take 6.25 mg by mouth 2 (two) times daily.  . cholecalciferol (VITAMIN D3) 25 MCG (1000 UT) tablet Take 2 tablets (2,000 Units total) by mouth 2 (two) times daily. (Patient taking differently: Take 2,000 Units by mouth daily. )  . ferrous sulfate 325 (65 FE) MG tablet Take 325 mg by mouth daily.  . finasteride (PROSCAR) 5 MG tablet Take 5 mg by mouth daily.  . folic acid (FOLVITE) 1 MG tablet Take 1 tablet (1 mg total) by mouth daily.  . furosemide (LASIX) 20 MG tablet Take 40 mg by mouth daily.   . hydrOXYzine (ATARAX/VISTARIL) 25 MG tablet Take 25 mg by mouth daily.   . metFORMIN (GLUCOPHAGE-XR) 500 MG 24 hr tablet Take 500 mg by mouth daily with breakfast.   . Multiple Vitamin (MULTIVITAMIN WITH MINERALS) TABS tablet Take 1 tablet by mouth daily.  . pantoprazole (PROTONIX) 40 MG tablet Take 1 tablet (  40 mg total) by mouth daily.  . polyethylene glycol (MIRALAX / GLYCOLAX) packet Take 17 g by mouth 2 (two) times daily. (Patient taking differently: Take 17 g by mouth daily as needed for mild constipation. Mix in coffee and drink)  . potassium chloride (KLOR-CON) 10 MEQ tablet Take 10 mEq by mouth daily.  . pravastatin (PRAVACHOL) 10 MG tablet Take 10 mg by mouth at bedtime.  . senna-docusate (SENOKOT-S) 8.6-50 MG tablet Take 1 tablet by mouth 2 (two) times daily. (Patient taking differently: Take 1 tablet by mouth daily. )  . spironolactone (ALDACTONE) 50 MG tablet Take 1 tablet (50 mg total) by mouth daily.  . tamsulosin (FLOMAX) 0.4 MG CAPS capsule Take 0.4 mg by mouth at bedtime.  . triamcinolone cream (KENALOG) 0.5 % Apply 1  application topically at bedtime.      Allergies: Allergies as of 05/25/2020  . (No Known Allergies)   Past Medical History:  Diagnosis Date  . Anemia 07/2019   Hb 6.7 >> 3 PRBCs >> 8.8 in 07/2019.     Marland Kitchen Atrial fibrillation ([place])   . Cholelithiasis   . Cirrhosis of liver ([place])    Presumed due to EtOH as well as fatty liver from morbid obesity.  Cirrhosis evident on CT scan 07/2018 but finding overlooked and formal diagnoses not made until 07/2019  . Colon cancer ([place]) 1999   Partial colectomy  . Coronary artery disease   . Diabetes mellitus without complication ([place])   . Diverticulosis   . Esophageal varices ([place])   . Gastric AVM   . 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 Obesity 07/2019  . Portal hypertension ([place])   . Tubular adenoma of colon     Family History: Mother has allergies to plastics.  Social History: Patient referred to himself as a past "heavy drinker" has been sober for 3 years. Did not quantify how many drinks per day.  Review of Systems: A complete ROS was negative except as per HPI.   Physical Exam: Blood pressure (!) 127/97, pulse 88, temperature (!) 97.4 F (36.3 C), temperature source Oral, resp. rate 12, height 5\' 6"  (1.676 m), weight 136.1 kg, SpO2 96 %. Physical Exam Constitutional:      Appearance: He is obese.  Eyes:     Extraocular Movements: Extraocular movements intact.  Cardiovascular:     Rate and Rhythm: Tachycardia present. Rhythm irregular.  Pulmonary:     Effort: Pulmonary effort is normal.     Comments: Decreased breath sounds bilaterally Abdominal:     Palpations: Abdomen is soft.     Comments: Decreased bowel sounds. Hernia located at the umbilicus.  Taut but not tender to palpation.  Musculoskeletal:        General: Swelling present.     Comments: 2+ pitting edema to the knee. Bilateral hands are edematous  Skin:    Comments: Picture of skin is included in ED note. Patient has diffuse vesicular,  pustules some are blistering across his extremities, abdomen, chest and scalp.     Neurological:     Mental Status: He is alert. Mental status is at baseline.  Psychiatric:        Mood and Affect: Mood normal.        Behavior: Behavior normal.     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 BNP 336 Patient noted that he has had decreased urine output and increasing edema in the extremties until his Lasix was increased.  Patient does not report any recent illness or travel. Patient also reports that he has seen some improvement in his edema as the days goes on, but at night it reaccumulates in his feet. Patient received Lasix 60mg  in ED. - Lasix 60mg  BID in AM - Bladder scan - F/u Echo - Continue home spironolactone 50mg  daily  A fib Per patient his has had A fib for 1 year. Patient reports that he was placed on an Plavix and Xarelto and ASA in the past; however he had GI bleeding and required 3 U pRBC transfusions. Patient was found to have diffuse portal gastropathy and bleeding at these lesions 09/2019. Patient has a hx of cirrhosis. Patient was seen by [facility] at the time. Patient's medication was discontinued. Patient was switched from Cardizem to Coreg to help with hepatic gastropathy. Hgb today 12.3. Patient HAS- BLED score is a 4 vs CHADsVASC of 4 with 4.8& of stroke within the next year. At this time will hold on anticoagulation due to past history . - continue Coreg 6.25 BID -f/u Trp -PT/INR  - f/u LFTs  Rash Patient reports that this rash is acute on chronic; however he has never had a breakout this bad. ED consulted Derm and they recommend continuing to treat patient as his PCP  has been doing for years with topical steroids and antihistamines. Patient WBC 13.6. - Kenalog 0.5% - Hydroxyzine 25mg   Asymptomatic Bacturia Patient reports no dysuria, but does retain some urine at baseline due to BPH. His UA appears similar to previous UA's. Patient has been afebrile. - f/u Bladder scan - Continue home tamsulosin - Continue home finesteride  T2DM Patient has a history of T2DM that he reports is well controlled on metformin. Na 127, K+4.7. BUN 21, Cr 1.23. - Hold metformin - Sliding scale -f/u A1c  Hx  Of Gout Patient has home medication allopurinol 300mg  QHS will continue.    Dispo: Admit patient to Inpatient with expected length of stay greater than 2 midnights.  Signed: Freida Busman, MD 05/25/2020, 6:46 PM  Pager: [phone] After 5pm on weekdays and 1pm on weekends: On Call pager: [phone]

## 2020-05-25 NOTE — ED Notes (Signed)
Purewick placed on pt. 

## 2020-05-25 NOTE — ED Provider Notes (Signed)
Emergency Department Provider Note   I have reviewed the triage vital signs and the nursing notes.   HISTORY  Chief Complaint Weakness and Rash   HPI Brandon [name] is a 74 y.o. male with past medical history of cirrhosis, diabetes, gastric AVM not tolerant of anticoagulation for atrial fibrillation presents to the emergency department with worsening generalized weakness and shortness of breath over the past 2 weeks.  Patient notes an associated itchy, blistering rash which she has had almost yearly since 2007.  He states it typically starts around Thanksgiving and improves with topical steroid medication.  He is followed by his primary care doctor who manages this and has never seen a dermatologist.  He is unsure of an exact diagnosis.  He states that the rash is currently having an outbreak in his the worst he has had.  He describes blistering which open and then has some associated bleeding.  He does not have any lesions in his mouth.  He is not experiencing fever or chills.  He is having some rash forming on his palms which is new for him. No new medications.   He also notes that in addition to the rash he is had to increase his Lasix in the past week due to increased fluid retention and shortness of breath.  This was done in conjunction with his primary care doctor. SOB is worse with exertion. No radiation of symptoms or modifying factors.   Past Medical History:  Diagnosis Date  . Anemia 07/2019   Hb 6.7 >> 3 PRBCs >> 8.8 in 07/2019.     Marland Kitchen Atrial fibrillation ([place])   . Cholelithiasis   . Cirrhosis of liver ([place])    Presumed due to EtOH as well as fatty liver from morbid obesity.  Cirrhosis evident on CT scan 07/2018 but finding overlooked and formal diagnoses not made until 07/2019  . Colon cancer ([place]) 1999   Partial colectomy  . Coronary artery disease   . Diabetes mellitus without complication ([place])   . Diverticulosis   . Esophageal varices ([place])   . Gastric AVM   . Gastric  hemorrhage due to angiodysplasia of stomach 07/2019  . Obesity 07/2019  . Portal hypertension ([place])   . Tubular adenoma of colon     Patient Active Problem List   Diagnosis Date Noted  . DRESS syndrome 05/28/2020  . Acute on chronic heart failure ([place]) 05/25/2020  . Alcoholic cirrhosis of liver with ascites ([place])   . Portal hypertensive gastropathy ([place])   . Melena 10/02/2019  . Angiodysplasia of stomach with hemorrhage   . Gastrointestinal hemorrhage   . Atrial fibrillation ([place])   . Chronic pain syndrome   . Symptomatic anemia 08/13/2019  . Acute upper GI bleed 08/13/2019  . Avulsion of right patellar tendon   . Closed displaced fracture of phalanx of toe of right foot 10/06/2018  . 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 Patella fracture, extensor mechanism disruption R knee  09/29/2018  . History of colon cancer 08/23/2018  . CAD (coronary artery disease) 08/23/2018  . DM (diabetes mellitus), type 2 ([place]) 08/23/2018  . Pyuria 08/23/2018    Past Surgical History:  Procedure Laterality Date  . APPLICATION OF WOUND VAC Right 09/30/2018   Procedure: Application Of Wound Vac;  Surgeon: Altamese Ramah, MD;  Location: [facility];  Service: Orthopedics;  Laterality: Right;  . COLONOSCOPY  2010   Removed 2 polyps of unclear type.  Not performed in [place]  . COLONOSCOPY  WITH PROPOFOL N/A 09/17/2019   Procedure: COLONOSCOPY WITH PROPOFOL;  Surgeon: [name], MD;  Location: [facility];  Service: Endoscopy;  Laterality: N/A;  . CORONARY ARTERY BYPASS GRAFT     in his 58s  . ESOPHAGOGASTRODUODENOSCOPY (EGD) WITH PROPOFOL N/A 08/14/2019   Procedure: ESOPHAGOGASTRODUODENOSCOPY (EGD) WITH PROPOFOL;  Surgeon: [name], MD;  Location: [facility];  Service: Gastroenterology;  Laterality: N/A;  . ESOPHAGOGASTRODUODENOSCOPY (EGD) WITH PROPOFOL N/A 09/17/2019   Procedure: ESOPHAGOGASTRODUODENOSCOPY (EGD) WITH PROPOFOL;  Surgeon: [name], MD;  Location: [facility];  Service:  Endoscopy;  Laterality: N/A;  . ESOPHAGOGASTRODUODENOSCOPY (EGD) WITH PROPOFOL N/A 10/04/2019   Procedure: ESOPHAGOGASTRODUODENOSCOPY (EGD) WITH PROPOFOL;  Surgeon: Mauri Pole, MD;  Location: [facility];  Service: Endoscopy;  Laterality: N/A;  . GIVENS CAPSULE STUDY N/A 09/17/2019   Procedure: GIVENS CAPSULE STUDY;  Surgeon: [name], MD;  Location: [facility];  Service: Endoscopy;  Laterality: N/A;  . HOT HEMOSTASIS N/A 08/14/2019   Procedure: HOT HEMOSTASIS (ARGON PLASMA COAGULATION/BICAP);  Surgeon: [name], MD;  Location[facility] ENDOSCOPY;  Service: Gastroenterology;  Laterality: N/A;  . HOT HEMOSTASIS N/A 09/17/2019   Procedure: HOT HEMOSTASIS (ARGON PLASMA COAGULATION/BICAP);  Surgeon: [name], MD;  Location: [facility];  Service: Endoscopy;  Laterality: N/A;  . PARTIAL COLECTOMY  2003   To address colon cancer  . PATELLAR TENDON REPAIR Right 09/30/2018   Procedure: PATELLA TENDON REPAIR;  Surgeon: Altamese Bottineau, MD;  Location: [facility];  Service: Orthopedics;  Laterality: Right;  . POLYPECTOMY  09/17/2019   Procedure: POLYPECTOMY;  Surgeon: [name], MD;  Location: [facility] ENDOSCOPY;  Service: Endoscopy;;    Allergies Patient has no known allergies.  Family History  Problem Relation Age of Onset  . Colon cancer Father   . CAD Other   . Colon cancer Paternal Uncle   . Colon cancer Paternal Uncle   . Diabetes Neg Hx     Social History Social History   Tobacco Use  . Smoking status: Former Smoker    Types: Cigarettes    Quit date: 1990    Years since quitting: 31.8  . Smokeless tobacco: Never Used  Vaping Use  . Vaping Use: Never used  Substance Use Topics  . 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 Drug use: Never    Review of Systems  Constitutional: No fever/chills. Positive generalized weakness.  Eyes: No visual changes. ENT: No sore throat. Cardiovascular: Denies chest pain. Respiratory: Positive  shortness of breath. Gastrointestinal: No abdominal pain.  No nausea, no vomiting.  No diarrhea.  No constipation. Genitourinary: Negative for dysuria. Musculoskeletal: Negative for back pain. Skin: Positive rash.  Neurological: Negative for headaches, focal weakness or numbness.  10-point ROS otherwise negative.  ____________________________________________   PHYSICAL EXAM:  VITAL SIGNS: ED Triage Vitals  Enc Vitals Group     BP 05/25/20 1310 138/76     Pulse Rate 05/25/20 1310 70     Resp 05/25/20 1310 20     Temp 05/25/20 1310 (!) 97.4 F (36.3 C)     Temp Source 05/25/20 1310 Oral     SpO2 05/25/20 1303 98 %     Weight 05/25/20 1311 300 lb (136.1 kg)     Height 05/25/20 1311 5\' 6"  (1.676 m)   Constitutional: Alert and oriented. Well appearing and in no acute distress. Eyes: Conjunctivae are normal.  Head: Atraumatic. Nose: No congestion/rhinnorhea. Mouth/Throat: Mucous membranes are moist.  Neck:  No stridor.   Cardiovascular: Normal rate, regular rhythm. Good peripheral circulation. Grossly normal heart sounds.   Respiratory: Normal respiratory effort.  No retractions. Lungs with rales at the bases.  Gastrointestinal: Soft and nontender. No distention.  Musculoskeletal: No lower extremity tenderness with 3+ pitting edema bilaterally. No gross deformities of extremities. Neurologic:  Normal speech and language. No gross focal neurologic deficits are appreciated.  Skin: Patient with diffuse erythematous rash over his entire body including the palms of his hands.  There is some central clearing in his spots.  He does have some areas of tense blistering without Nikolsky sign.  Some areas of blister have opened and are oozing some clear fluid.  No petechiae.  No oral mucosal lesions.          ____________________________________________   LABS (all labs ordered are listed, but only abnormal results are displayed)  Labs Reviewed  URINE CULTURE - Abnormal; Notable  for the following components:      Result Value   Culture MULTIPLE SPECIES PRESENT, SUGGEST RECOLLECTION (*)    All other components within normal limits  COMPREHENSIVE METABOLIC PANEL - Abnormal; Notable for the following components:   Glucose, Bld 111 (*)    Total Protein 6.0 (*)    Albumin 2.8 (*)    All other components within normal limits  BRAIN NATRIURETIC PEPTIDE - Abnormal; Notable for the following components:   B Natriuretic Peptide 336.4 (*)    All other components within normal limits  CBC WITH DIFFERENTIAL/PLATELET - Abnormal; Notable for the following components:   WBC 13.6 (*)    Hemoglobin 12.3 (*)    MCHC 29.4 (*)    RDW 16.8 (*)    Monocytes Absolute 0.0 (*)    Eosinophils Absolute 6.7 (*)    All other components within normal limits  URINALYSIS, ROUTINE W REFLEX MICROSCOPIC - Abnormal; Notable for the following components:   APPearance CLOUDY (*)    Hgb urine dipstick MODERATE (*)    Leukocytes,Ua LARGE (*)    WBC, UA >50 (*)    Bacteria, UA RARE (*)    All other components within normal limits  CBC WITH DIFFERENTIAL/PLATELET - Abnormal; Notable for the following components:   WBC 14.2 (*)    Hemoglobin 12.3 (*)    RDW 16.4 (*)    Eosinophils Absolute 6.6 (*)    Abs Immature Granulocytes 0.10 (*)    All other components within normal limits  HEMOGLOBIN A1C - Abnormal; Notable for the following components:   Hgb A1c MFr Bld 6.9 (*)    All other components within normal limits  GLUCOSE, CAPILLARY - Abnormal; Notable for the following components:   Glucose-Capillary 116 (*)    All other components within normal limits  GLUCOSE, CAPILLARY - Abnormal; Notable for the following components:   Glucose-Capillary 100 (*)    All other components within normal limits  GLUCOSE, CAPILLARY - Abnormal; Notable for the following components:   Glucose-Capillary 123 (*)    All other components within normal limits  GLUCOSE, CAPILLARY - Abnormal; Notable for the  following components:   Glucose-Capillary 106 (*)    All other components within normal limits  COMPREHENSIVE METABOLIC PANEL - Abnormal; Notable for the following components:   Sodium 132 (*)    Chloride 94 (*)    Glucose, Bld 230 (*)    BUN 26 (*)    Creatinine, Ser 1.51 (*)    Total Protein 5.6 (*)    Albumin 2.5 (*)  GFR, Estimated 48 (*)    All other components within normal limits  GLUCOSE, CAPILLARY - Abnormal; Notable for the following components:   Glucose-Capillary 207 (*)    All other components within normal limits  GLUCOSE, CAPILLARY - Abnormal; Notable for the following components:   Glucose-Capillary 134 (*)    All other components within normal limits  GLUCOSE, CAPILLARY - Abnormal; Notable for the following components:   Glucose-Capillary 194 (*)    All other components within normal limits  CBC WITH DIFFERENTIAL/PLATELET - Abnormal; Notable for the following components:   WBC 17.3 (*)    RDW 16.1 (*)    Monocytes Absolute 1.3 (*)    Eosinophils Absolute 6.9 (*)    Abs Immature Granulocytes 0.11 (*)    All other components within normal limits  GLUCOSE, CAPILLARY - Abnormal; Notable for the following components:   Glucose-Capillary 133 (*)    All other components within normal limits  COMPREHENSIVE METABOLIC PANEL - Abnormal; Notable for the following components:   Sodium 132 (*)    Chloride 94 (*)    Glucose, Bld 122 (*)    BUN 32 (*)    Creatinine, Ser 1.65 (*)    Total Protein 6.0 (*)    Albumin 2.6 (*)    GFR, Estimated 44 (*)    All other components within normal limits  CBC WITH DIFFERENTIAL/PLATELET - Abnormal; Notable for the following components:   WBC 17.1 (*)    RDW 15.9 (*)    Monocytes Absolute 1.1 (*)    Eosinophils Absolute 7.5 (*)    Abs Immature Granulocytes 0.18 (*)    All other components within normal limits  GLUCOSE, CAPILLARY - Abnormal; Notable for the following components:   Glucose-Capillary 117 (*)    All other components  within normal limits  GLUCOSE, CAPILLARY - Abnormal; Notable for the following components:   Glucose-Capillary 172 (*)    All other components within normal limits  GLUCOSE, CAPILLARY - Abnormal; Notable for the following components:   Glucose-Capillary 122 (*)    All other components within normal limits  GLUCOSE, CAPILLARY - Abnormal; Notable for the following components:   Glucose-Capillary 168 (*)    All other components within normal limits  GLUCOSE, CAPILLARY - Abnormal; Notable for the following components:   Glucose-Capillary 124 (*)    All other components within normal limits  COMPREHENSIVE METABOLIC PANEL - Abnormal; Notable for the following components:   Sodium 129 (*)    Chloride 93 (*)    Glucose, Bld 139 (*)    BUN 38 (*)    Creatinine, Ser 1.62 (*)    Total Protein 5.8 (*)    Albumin 2.5 (*)    GFR, Estimated 45 (*)    All other components within normal limits  CBC WITH DIFFERENTIAL/PLATELET - Abnormal; Notable for the following components:   WBC 11.1 (*)    Hemoglobin 12.8 (*)    RDW 15.7 (*)    Neutro Abs 8.7 (*)    Eosinophils Absolute 1.2 (*)    Abs Immature Granulocytes 0.10 (*)    All other components within normal limits  GLUCOSE, CAPILLARY - Abnormal; Notable for the following components:   Glucose-Capillary 203 (*)    All other components within normal limits  GLUCOSE, CAPILLARY - Abnormal; Notable for the following components:   Glucose-Capillary 116 (*)    All other components within normal limits  RESPIRATORY PANEL BY RT PCR (FLU A&B, COVID)  MRSA PCR SCREENING  PATHOLOGIST  SMEAR REVIEW  BASIC METABOLIC PANEL  TSH  PROTIME-INR  CBC WITH DIFFERENTIAL/PLATELET  HIV ANTIBODY (ROUTINE TESTING W REFLEX)  RPR  SURGICAL PATHOLOGY  TROPONIN I (HIGH SENSITIVITY)  TROPONIN I (HIGH SENSITIVITY)   ____________________________________________  EKG   EKG Interpretation  Date/Time:  Friday May 25 2020 13:07:31 EDT Ventricular Rate:  82 PR  Interval:    QRS Duration: 106 QT Interval:  421 QTC Calculation: 492 R Axis:   19 Text Interpretation: Atrial fibrillation S1,S2,S3 pattern Low voltage, precordial leads Borderline prolonged QT interval Baseline wander in lead(s) V2 Partial missing lead(s): V2 No STEMI Confirmed by Nanda Quinton [phone]) on 05/25/2020 5:03:28 PM       ____________________________________________  RADIOLOGY  DG Chest Portable 1 View  Result Date: 05/25/2020 CLINICAL DATA:  Shortness of breath. EXAM: PORTABLE CHEST 1 VIEW COMPARISON:  October 02, 2019. FINDINGS: Enlarged cardiac silhouette. Prior CABG. Aortic atherosclerosis. No focal consolidation. No visible pleural effusions or pneumothorax on this single portable semi erect radiograph. Chronic right shoulder changes. IMPRESSION: 1. No acute cardiopulmonary disease. 2. Chronic cardiomegaly. Electronically Signed   By: Margaretha Sheffield MD   On: 05/25/2020 13:50    ____________________________________________   PROCEDURES  Procedure(s) performed:   Procedures  None  ____________________________________________   INITIAL IMPRESSION / ASSESSMENT AND PLAN / ED COURSE  Pertinent labs & imaging results that were available during my care of the patient were reviewed by me and considered in my medical decision making (see chart for details).   Patient presents emergency department with generalized weakness with shortness of breath.  Has 3+ pitting edema in the bilateral lower extremities despite increasing his Lasix over the past week.  He also has a diffuse, itchy rash of unclear etiology.  He is never seen a dermatologist but has outbreaks yearly.  Suspect allergic type rash but he is developing some on the palms of his hands which is somewhat unusual.  Exam does not seem consistent with TN/SJS.  Plan for labs, chest x-ray, reassess.  Discussed case with Dermatology on call at [facility]. Advises topical steroid and outpatinet f/u. Agrees with description  as concerning for EM but no mucosal surface involvement. Plan for admit for diuresis.   Discussed patient's case with IM teaching service to request admission. Patient and family (if present) updated with plan. Care transferred to Medicine service.  I reviewed all nursing notes, vitals, pertinent old records, EKGs, labs, imaging (as available).  ____________________________________________  FINAL CLINICAL IMPRESSION(S) / ED DIAGNOSES  Final diagnoses:  Anasarca     MEDICATIONS GIVEN DURING THIS VISIT:  Medications  carvedilol (COREG) tablet 6.25 mg (6.25 mg Oral Given 05/28/20 2107)  pravastatin (PRAVACHOL) tablet 10 mg (10 mg Oral Given 05/28/20 2107)  polyethylene glycol (MIRALAX / GLYCOLAX) packet 17 g (has no administration in time range)  finasteride (PROSCAR) tablet 5 mg (5 mg Oral Given 05/28/20 0824)  tamsulosin (FLOMAX) capsule 0.4 mg (0.4 mg Oral Given 05/28/20 2106)  triamcinolone cream (KENALOG) 0.5 % 1 application (1 application Topical Not Given 05/28/20 2109)  sodium chloride flush (NS) 0.9 % injection 3 mL (0 mLs Intravenous Duplicate 76/7/20 9470)  sodium chloride flush (NS) 0.9 % injection 3 mL (has no administration in time range)  0.9 %  sodium chloride infusion (has no administration in time range)  acetaminophen (TYLENOL) tablet 650 mg (650 mg Oral Given 05/28/20 2106)  ondansetron (ZOFRAN) injection 4 mg (has no administration in time range)  insulin aspart (novoLOG) injection 0-9 Units (0 Units Subcutaneous  Not Given 05/29/20 0619)  influenza vaccine adjuvanted (FLUAD) injection 0.5 mL (has no administration in time range)  lidocaine HCl (PF) (XYLOCAINE) 2 % injection 5 mL (has no administration in time range)  perflutren lipid microspheres (DEFINITY) IV suspension (3 mLs Intravenous Given 05/26/20 1138)  lidocaine (PF) (XYLOCAINE) 1 % injection (has no administration in time range)  predniSONE (DELTASONE) tablet 60 mg (60 mg Oral Given 05/28/20 1510)  hydrOXYzine  (ATARAX/VISTARIL) tablet 50 mg (50 mg Oral Given 05/29/20 0622)  furosemide (LASIX) injection 60 mg (60 mg Intravenous Given 05/25/20 1718)  hydrOXYzine (ATARAX/VISTARIL) tablet 50 mg (50 mg Oral Given 05/28/20 0133)  hydrOXYzine (ATARAX/VISTARIL) tablet 25 mg (25 mg Oral Given 05/29/20 0228)    Note:  This document was prepared using Dragon voice recognition software and may include unintentional dictation errors.  Nanda Quinton, MD, [facility] Emergency Medicine    Lasharon Dunivan, [facility], MD 05/29/20 [phone]

## 2020-05-26 ENCOUNTER — Other Ambulatory Visit: Payer: Self-pay | Admitting: Internal Medicine

## 2020-05-26 ENCOUNTER — Inpatient Hospital Stay (HOSPITAL_COMMUNITY): Payer: Medicare Other

## 2020-05-26 ENCOUNTER — Encounter (HOSPITAL_COMMUNITY): Payer: Self-pay | Admitting: Internal Medicine

## 2020-05-26 DIAGNOSIS — I5033 Acute on chronic diastolic (congestive) heart failure: Secondary | ICD-10-CM

## 2020-05-26 DIAGNOSIS — I5031 Acute diastolic (congestive) heart failure: Secondary | ICD-10-CM

## 2020-05-26 DIAGNOSIS — I4891 Unspecified atrial fibrillation: Secondary | ICD-10-CM

## 2020-05-26 DIAGNOSIS — L120 Bullous pemphigoid: Principal | ICD-10-CM

## 2020-05-26 DIAGNOSIS — E119 Type 2 diabetes mellitus without complications: Secondary | ICD-10-CM

## 2020-05-26 LAB — GLUCOSE, CAPILLARY
Glucose-Capillary: 100 mg/dL — ABNORMAL HIGH (ref 70–99)
Glucose-Capillary: 123 mg/dL — ABNORMAL HIGH (ref 70–99)
Glucose-Capillary: 106 mg/dL — ABNORMAL HIGH (ref 70–99)
Glucose-Capillary: 207 mg/dL — ABNORMAL HIGH (ref 70–99)

## 2020-05-26 LAB — PROTIME-INR
INR: 1.1 (ref 0.8–1.2)
Prothrombin Time: 13.6 s (ref 11.4–15.2)

## 2020-05-26 LAB — CBC WITH DIFFERENTIAL/PLATELET
Abs Immature Granulocytes: 0.1 10*3/uL — ABNORMAL HIGH (ref 0.00–0.07)
Basophils Absolute: 0.1 10*3/uL (ref 0.0–0.1)
Basophils Relative: 1 %
Eosinophils Absolute: 6.6 10*3/uL — ABNORMAL HIGH (ref 0.0–0.5)
Eosinophils Relative: 46 %
HCT: 40.2 % (ref 39.0–52.0)
Hemoglobin: 12.3 g/dL — ABNORMAL LOW (ref 13.0–17.0)
Immature Granulocytes: 1 %
Lymphocytes Relative: 7 %
Lymphs Abs: 1 10*3/uL (ref 0.7–4.0)
MCH: 28 pg (ref 26.0–34.0)
MCHC: 30.6 g/dL (ref 30.0–36.0)
MCV: 91.6 fL (ref 80.0–100.0)
Monocytes Absolute: 1 10*3/uL (ref 0.1–1.0)
Monocytes Relative: 7 %
Neutro Abs: 5.3 10*3/uL (ref 1.7–7.7)
Neutrophils Relative %: 38 %
Platelets: 215 10*3/uL (ref 150–400)
RBC: 4.39 MIL/uL (ref 4.22–5.81)
RDW: 16.4 % — ABNORMAL HIGH (ref 11.5–15.5)
WBC: 14.2 10*3/uL — ABNORMAL HIGH (ref 4.0–10.5)
nRBC: 0.2 % (ref 0.0–0.2)

## 2020-05-26 LAB — HEMOGLOBIN A1C
Hgb A1c MFr Bld: 6.9 % — ABNORMAL HIGH (ref 4.8–5.6)
Mean Plasma Glucose: 151.33 mg/dL

## 2020-05-26 LAB — ECHOCARDIOGRAM COMPLETE
AR max vel: 1.28 cm2
AV Area VTI: 1.45 cm2
AV Area mean vel: 1.32 cm2
AV Mean grad: 11 mmHg
AV Peak grad: 19.5 mmHg
Ao pk vel: 2.21 m/s
Height: 66 in
P 1/2 time: 1499 msec
S' Lateral: 2.6 cm
Weight: 4888.92 oz

## 2020-05-26 LAB — BASIC METABOLIC PANEL WITH GFR
Anion gap: 10 (ref 5–15)
BUN: 20 mg/dL (ref 8–23)
CO2: 27 mmol/L (ref 22–32)
Calcium: 9.5 mg/dL (ref 8.9–10.3)
Chloride: 101 mmol/L (ref 98–111)
Creatinine, Ser: 1.2 mg/dL (ref 0.61–1.24)
GFR, Estimated: >60 mL/min
Glucose, Bld: 99 mg/dL (ref 70–99)
Potassium: 4.5 mmol/L (ref 3.5–5.1)
Sodium: 138 mmol/L (ref 135–145)

## 2020-05-26 LAB — TSH: TSH: 2.103 u[IU]/mL (ref 0.350–4.500)

## 2020-05-26 LAB — URINE CULTURE

## 2020-05-26 LAB — MRSA PCR SCREENING: MRSA by PCR: NEGATIVE

## 2020-05-26 LAB — TROPONIN I (HIGH SENSITIVITY): Troponin I (High Sensitivity): 7 ng/L (ref <18)

## 2020-05-26 MED ORDER — PERFLUTREN LIPID MICROSPHERE
1.0000 mL | INTRAVENOUS | Status: AC | PRN
  Administered 2020-05-26: 3 mL via INTRAVENOUS
  Filled 2020-05-26: qty 10

## 2020-05-26 MED ORDER — INFLUENZA VAC A&B SA ADJ QUAD 0.5 ML IM PRSY
0.5000 mL | PREFILLED_SYRINGE | INTRAMUSCULAR | Status: AC
  Administered 2020-06-04: 0.5 mL via INTRAMUSCULAR
  Filled 2020-05-26 (×2): qty 0.5

## 2020-05-26 MED ORDER — LIDOCAINE HCL (PF) 2 % IJ SOLN
5.0000 mL | Freq: Once | INTRAMUSCULAR | Status: DC | PRN
  Filled 2020-05-26: qty 5

## 2020-05-26 MED ORDER — EMPAGLIFLOZIN 10 MG PO TABS
10.0000 mg | ORAL_TABLET | Freq: Every day | ORAL | Status: DC
  Administered 2020-05-26 – 2020-05-28 (×3): 10 mg via ORAL
  Filled 2020-05-26 (×3): qty 1

## 2020-05-26 MED ORDER — LIDOCAINE HCL (PF) 1 % IJ SOLN
INTRAMUSCULAR | Status: AC
  Filled 2020-05-26: qty 5

## 2020-05-26 MED ORDER — LIDOCAINE HCL (PF) 2 % IJ SOLN
2.0000 mL | Freq: Once | INTRAMUSCULAR | Status: DC | PRN
  Filled 2020-05-26: qty 2

## 2020-05-26 NOTE — Progress Notes (Addendum)
   Subjective: Patient reports that he feels better this AM. He is not having SOB and slept well. He reports that he notices decreased swelling in his extremities. His rash continues to be pruritic and uncomfortable. Patient reports that his abdomen feels slightly tight.  Objective:  Vital signs in last 24 hours: Vitals:   05/25/20 2145 05/26/20 0353 05/26/20 0400 05/26/20 0952  BP: 124/73 120/68  107/65  Pulse: 90 100  99  Resp: 18 18    Temp: 98.1 F (36.7 C) 97.7 F (36.5 C)  (!) 97.5 F (36.4 C)  TempSrc: Oral Oral  Oral  SpO2: 98% 98%  98%  Weight:   (!) 138.6 kg   Height:   5\' 6"  (1.676 m)    Physical Exam Constitutional:      Appearance: He is obese.  Cardiovascular:     Rate and Rhythm: Tachycardia present. Rhythm irregular.  Pulmonary:     Effort: Pulmonary effort is normal.     Breath sounds: Normal breath sounds.  Abdominal:     General: Bowel sounds are normal.     Palpations: Abdomen is soft.     Tenderness: There is no abdominal tenderness.     Hernia: A hernia is present.  Skin:    Comments: Diffuse bullous lesions across all extremities and the abdomen, chest, back, and head. Lesions are in different stages some are bullous blisters, some are ruptured and bleeding and some are scabbing. Rash is pruritic. There also appears to be large patches of skin on the lateral to mid thigh a under the vesicles are erythematous and raised.  Neurological:     Mental Status: He is alert.  Psychiatric:        Mood and Affect: Mood normal.     Assessment/Plan:  Active Problems:   Acute on chronic heart failure (HCC)  Acute on chronic HF, likely HFpEF Patient most recent echo 08/2019 noted EF 55-60 with no wall motion abnormalities. Patient output 1.5L. Will start patient on Jardiance as it has new research to show that in diabetic patients with heart failure it can decrease CHF exacerbation episodes and subsequent hospitalizations. Patient LFTs were WNL. - Lasix 60mg   BID  - Bladder scan - F/u Echo - Continue home spironolactone 50mg  daily - 10mg  daily Empagliflozin  A fib Patient continues to have irregular heartbeat on exam. Trp 7.  - continue Coreg 6.25 BID -PT/INR    Rash Patient is MRSA negative. WBC 13.6>14.2. 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 - Kenalog 0.5% - Hydroxyzine 25mg  - Core biopsies  Asymptomatic Bacturia Patient reports no dysuria, but does retain some urine at baseline due to BPH. His UA appears similar to previous UA's. Patient has been afebrile. - f/u Bladder scan - Continue home tamsulosin - Continue home finesteride  T2DM Patient has a history of T2DM that he reports is well controlled on metformin. Na 137>138, K+4.5. BUN 20, Cr 1.20. Hgb A1c 6.9. - Hold metformin - Sliding scale -Start empagliflozin  Hx  Of Gout Patient has home medication allopurinol 300mg  QHS will continue.    Prior to Admission Living Arrangement: Home Anticipated Discharge Location: [place] Barriers to Discharge: Treatment Dispo: Anticipated discharge in approximately 1-2 day(s).   Freida Busman, MD 05/26/2020, 10:07 AM Pager: [phone] After 5pm on weekdays and 1pm on weekends: On Call pager [phone]

## 2020-05-26 NOTE — Progress Notes (Signed)
  Echocardiogram 2D Echocardiogram has been performed with [facility].  Brandon [name] 05/26/2020, 11:57 AM

## 2020-05-26 NOTE — Progress Notes (Addendum)
  Date: 05/26/2020  Patient name: Brandon [name]  Medical record number: [id number]  Date of birth: [birth year]   I have seen and evaluated Brandon [name] and discussed their care with the Residency Team. Briefly, Brandon [name] is a 74 year old man with HFpEF, chronic, DM2, Afib not on AC due to bleeding, BPH who presents with swelling and and increased fluid retention.  He had recently had a decrease in his lasix dosing and has noticed increased fluid retention after that.  He reports having echos every 6 months or so.  He also has a full body rash that presents once a year around this time, but this is the worst it has been.  He notes pruritis, bullae that are tense and then pop and form craters.  These drain clear fluid.  He further has raised erythematous patches which are pruritic.  His PCP has been treating with steroid cream, however, this is now pervasive and not amenable to cream therapy.  He reports having biopsies 3 years ago at [facility], but I could not find results in our system or in [facility].   Vitals:   05/25/20 2145 05/26/20 0353  BP: 124/73 120/68  Pulse: 90 100  Resp: 18 18  Temp: 98.1 F (36.7 C) 97.7 F (36.5 C)  SpO2: 98% 98%   General: Alert, awake, lying in bed Eyes: Anicteric sclerae, no injection HENT: neck is supple, large beard and body habitus make JVD difficult to appreciate CV: RR, NR, no murmur noted.  + peripheral edema of bilateral LE to the knees with pitting to the mid calf Pulm: Decreased Breath sounds bilaterally, effort normal, no wheezing Abd: + umbilical hernia, obese, +BS, NT MSK: Normal bulk and tone, no contractures Skin: Diffuse and confluent raised erythematous rash, at times appears to be hives, tense bullae noted on hands, areas of cratering and scabbing over legs and arms.  Areas of excoriation on legs.   Psych: Pleasant, normal mood and affect  Assessment and Plan: I have seen and evaluated the patient as outlined above. I agree  with the formulated Assessment and Plan as detailed in the residents' note, with the following changes:   1. Acute on Chronic HFpEF, EF 55-60% - Continue IV Diuresis - Telemetry - Strict I/O - Daily weight - Monitor renal function - Repeat TTE pending - Continue treatment for Afib - Consider adding Jardiance as an option to optimize both DM and HFpEF treatment.   2. Bullous erythematous rash - Kenalog solution or cream for areas of severe rash - Hydroxyzine PRN for itching - Biopsy today - I am consider bullous pemphigoid, so will need to send a DIF today - Interestingly he has eosinophilia and an elevated WBC which was not present back in March.  He is on allopurinol which I will stop at this time.  He does not meet criteria for DRESS syndrome at this time, but I am curious if this could be a drug reaction.  Will have team perform a thorough medication review.   Other issues per Resident daily note today.   Sid Falcon, MD 10/30/20218:40 AM

## 2020-05-27 LAB — COMPREHENSIVE METABOLIC PANEL
ALT: 16 U/L (ref 0–44)
AST: 18 U/L (ref 15–41)
Albumin: 2.5 g/dL — ABNORMAL LOW (ref 3.5–5.0)
Alkaline Phosphatase: 93 U/L (ref 38–126)
Anion gap: 11 (ref 5–15)
BUN: 26 mg/dL — ABNORMAL HIGH (ref 8–23)
CO2: 27 mmol/L (ref 22–32)
Calcium: 9.3 mg/dL (ref 8.9–10.3)
Chloride: 94 mmol/L — ABNORMAL LOW (ref 98–111)
Creatinine, Ser: 1.51 mg/dL — ABNORMAL HIGH (ref 0.61–1.24)
GFR, Estimated: 48 mL/min — ABNORMAL LOW (ref >60)
Glucose, Bld: 230 mg/dL — ABNORMAL HIGH (ref 70–99)
Potassium: 4 mmol/L (ref 3.5–5.1)
Sodium: 132 mmol/L — ABNORMAL LOW (ref 135–145)
Total Bilirubin: 0.8 mg/dL (ref 0.3–1.2)
Total Protein: 5.6 g/dL — ABNORMAL LOW (ref 6.5–8.1)

## 2020-05-27 LAB — GLUCOSE, CAPILLARY
Glucose-Capillary: 134 mg/dL — ABNORMAL HIGH (ref 70–99)
Glucose-Capillary: 194 mg/dL — ABNORMAL HIGH (ref 70–99)
Glucose-Capillary: 133 mg/dL — ABNORMAL HIGH (ref 70–99)
Glucose-Capillary: 117 mg/dL — ABNORMAL HIGH (ref 70–99)
Glucose-Capillary: 172 mg/dL — ABNORMAL HIGH (ref 70–99)

## 2020-05-27 LAB — CBC WITH DIFFERENTIAL/PLATELET
Abs Immature Granulocytes: 0.11 10*3/uL — ABNORMAL HIGH (ref 0.00–0.07)
Basophils Absolute: 0.1 10*3/uL (ref 0.0–0.1)
Basophils Relative: 1 %
Eosinophils Absolute: 6.9 10*3/uL — ABNORMAL HIGH (ref 0.0–0.5)
Eosinophils Relative: 40 %
HCT: 42.1 % (ref 39.0–52.0)
Hemoglobin: 13.2 g/dL (ref 13.0–17.0)
Immature Granulocytes: 1 %
Lymphocytes Relative: 8 %
Lymphs Abs: 1.3 10*3/uL (ref 0.7–4.0)
MCH: 28.6 pg (ref 26.0–34.0)
MCHC: 31.4 g/dL (ref 30.0–36.0)
MCV: 91.1 fL (ref 80.0–100.0)
Monocytes Absolute: 1.3 10*3/uL — ABNORMAL HIGH (ref 0.1–1.0)
Monocytes Relative: 7 %
Neutro Abs: 7.6 10*3/uL (ref 1.7–7.7)
Neutrophils Relative %: 43 %
Platelets: 223 10*3/uL (ref 150–400)
RBC: 4.62 MIL/uL (ref 4.22–5.81)
RDW: 16.1 % — ABNORMAL HIGH (ref 11.5–15.5)
WBC: 17.3 10*3/uL — ABNORMAL HIGH (ref 4.0–10.5)
nRBC: 0.1 % (ref 0.0–0.2)

## 2020-05-27 MED ORDER — HYDROXYZINE HCL 25 MG PO TABS
25.0000 mg | ORAL_TABLET | Freq: Three times a day (TID) | ORAL | Status: DC | PRN
  Administered 2020-05-27 – 2020-05-28 (×4): 25 mg via ORAL
  Filled 2020-05-27 (×4): qty 1

## 2020-05-27 NOTE — Progress Notes (Signed)
Subjective: HD#2 Overnight, no acute events reported.  This morning, patient evaluated at bedside. He is sitting up in bed and eating breakfast. He denies any dyspnea; however, has persistent edema in his hands. He notes that his rash continues to be pruritic and uncomfortable with lots of blisters. He says that he has a history of this intermittently happening; however, has not been this bad.  Discussed that we are awaiting pathology results.   Objective:  Vital signs in last 24 hours: Vitals:   05/26/20 2032 05/27/20 0008 05/27/20 0456 05/27/20 0506  BP: 116/66 (!) 156/79  133/78  Pulse: 89 79  82  Resp: 16 18  12   Temp: 97.8 F (36.6 C) 98.1 F (36.7 C)  (!) 97.5 F (36.4 C)  TempSrc:  Oral  Oral  SpO2: 98% 99%  97%  Weight:   (!) 136.8 kg   Height:       CBC Latest Ref Rng & Units 05/26/2020 05/25/2020 10/06/2019  WBC 4.0 - 10.5 K/uL 14.2(H) 13.6(H) 7.3  Hemoglobin 13.0 - 17.0 g/dL 12.3(L) 12.3(L) 8.5(L)  Hematocrit 39 - 52 % 40.2 41.8 29.7(L)  Platelets 150 - 400 K/uL 215 203 236   BMP Latest Ref Rng & Units 05/26/2020 05/25/2020 10/04/2019  Glucose 70 - 99 mg/dL 99 111(H) 104(H)  BUN 8 - 23 mg/dL 20 21 17   Creatinine 0.61 - 1.24 mg/dL 1.20 1.23 1.04  Sodium 135 - 145 mmol/L 138 137 135  Potassium 3.5 - 5.1 mmol/L 4.5 4.7 4.2  Chloride 98 - 111 mmol/L 101 102 104  CO2 22 - 32 mmol/L 27 24 22   Calcium 8.9 - 10.3 mg/dL 9.5 9.2 9.3    Physical Exam  Constitutional: Appears well-developed and well-nourished. No distress.  HENT: Normocephalic and atraumatic, EOMI,  moist mucous membranes Cardiovascular: Normal rate, irregular rhythm, S1 and S2 present, no murmur, no rubs or gallops.  Distal pulses intact; unable to appreciate JVD due to body habitus Respiratory: No respiratory distress, no accessory muscle use.  Effort is normal.  Lungs are clear to auscultation bilaterally.  GI: Nondistended, soft, nontender to palpation, normal active bowel sounds; reducible umbilical  hernia  Musculoskeletal: Normal bulk and tone.  1+ pitting edema to mid tibia bilaterally  Neurological: Is alert and oriented x4, no apparent focal deficits noted. Skin: Warm and dry.  Diffuse vesicular rash on bilateral upper and lower extremities with open blisters   Assessment/Plan:  Active Problems:   Acute on chronic heart failure [facility])  [name] is a 74 year old male with PMHx of DM, Atrial fibrillation (not on anticoagulation), cirrhosis with esophageal varices, congestive heart failure admitted for acute on chronic HFpEF exacerbation.   Acute on chronic HFpEF exacerbation: Patient is on IV Lasix 60mg  twice daily. He has had 2.8L UOP overnight;  Net -2.1L since admission and down 1.8kg since admission. Echo with LVEF 60-65%, no RWMA, mild aortic regurgitation that is stable from prior exam. BMP with stable renal function and no electrolyte abnormalities. Continues to have bilateral lower extremity edema. - Continue IV Lasix 60mg  bid - Continue aldactone 50mg  daily - Continue potassium 52mEq daily  - Strict I&O - BMP daily   Atrial fibrillation, rate controlled CHADSVASC2 score 4; HASBLED score 4 with history of GI bleed requiring blood transfusion when on anticoagulation with DAPT in the past. Is currently rate controlled with beta blocker - Continue carvedilol 6.25mg  bid - Continue cardiac monitoring   Vesicular rash: Patient with diffuse pruritic vesicular rash.  Punch biopsy yesterday; awaiting pathology results. He continues to have significant pruritis with some open vesicles.  - Increased hydroxyzine to 25mg  tid - Kenalog cream  - F/u pathology results   Diabetes Mellitus: HbA1c 6.9. He is on metformin at home. Patient started on SGLT-2i yesterday to decrease frequency of CHF exacerbations in diabetes.  - Continue Jardiance 10mg  daily - Continue CBG monitoring  - Continue SSI  Diet: HH/CM Fluids: None Electrolytes: K supplementation DVT Prophylaxis: SCDs,  OOB as tolerated  Code status: DNR    Prior to Admission Living Arrangement: Home Anticipated Discharge Location: Home Barriers to Discharge: Continued medical management  Dispo: Anticipated discharge in approximately 1-2 day(s).   [name], MD 05/27/2020, 5:49 AM Pager: @MYPAGER @ After 5pm on weekdays and 1pm on weekends: On Call pager [phone]

## 2020-05-28 DIAGNOSIS — L120 Bullous pemphigoid: Secondary | ICD-10-CM | POA: Diagnosis present

## 2020-05-28 DIAGNOSIS — T50905A Adverse effect of unspecified drugs, medicaments and biological substances, initial encounter: Secondary | ICD-10-CM | POA: Diagnosis present

## 2020-05-28 DIAGNOSIS — T68XXXA Hypothermia, initial encounter: Secondary | ICD-10-CM

## 2020-05-28 DIAGNOSIS — D7212 Drug rash with eosinophilia and systemic symptoms syndrome: Secondary | ICD-10-CM

## 2020-05-28 LAB — COMPREHENSIVE METABOLIC PANEL
ALT: 18 U/L (ref 0–44)
AST: 17 U/L (ref 15–41)
Albumin: 2.6 g/dL — ABNORMAL LOW (ref 3.5–5.0)
Alkaline Phosphatase: 97 U/L (ref 38–126)
Anion gap: 11 (ref 5–15)
BUN: 32 mg/dL — ABNORMAL HIGH (ref 8–23)
CO2: 27 mmol/L (ref 22–32)
Calcium: 9.4 mg/dL (ref 8.9–10.3)
Chloride: 94 mmol/L — ABNORMAL LOW (ref 98–111)
Creatinine, Ser: 1.65 mg/dL — ABNORMAL HIGH (ref 0.61–1.24)
GFR, Estimated: 44 mL/min — ABNORMAL LOW (ref >60)
Glucose, Bld: 122 mg/dL — ABNORMAL HIGH (ref 70–99)
Potassium: 4.4 mmol/L (ref 3.5–5.1)
Sodium: 132 mmol/L — ABNORMAL LOW (ref 135–145)
Total Bilirubin: 0.7 mg/dL (ref 0.3–1.2)
Total Protein: 6 g/dL — ABNORMAL LOW (ref 6.5–8.1)

## 2020-05-28 LAB — CBC WITH DIFFERENTIAL/PLATELET
Abs Immature Granulocytes: 0.18 10*3/uL — ABNORMAL HIGH (ref 0.00–0.07)
Basophils Absolute: 0.1 10*3/uL (ref 0.0–0.1)
Basophils Relative: 1 %
Eosinophils Absolute: 7.5 10*3/uL — ABNORMAL HIGH (ref 0.0–0.5)
Eosinophils Relative: 44 %
HCT: 43.3 % (ref 39.0–52.0)
Hemoglobin: 13.7 g/dL (ref 13.0–17.0)
Immature Granulocytes: 1 %
Lymphocytes Relative: 9 %
Lymphs Abs: 1.5 10*3/uL (ref 0.7–4.0)
MCH: 28.5 pg (ref 26.0–34.0)
MCHC: 31.6 g/dL (ref 30.0–36.0)
MCV: 90.2 fL (ref 80.0–100.0)
Monocytes Absolute: 1.1 10*3/uL — ABNORMAL HIGH (ref 0.1–1.0)
Monocytes Relative: 6 %
Neutro Abs: 6.7 10*3/uL (ref 1.7–7.7)
Neutrophils Relative %: 39 %
Platelets: 221 10*3/uL (ref 150–400)
RBC: 4.8 MIL/uL (ref 4.22–5.81)
RDW: 15.9 % — ABNORMAL HIGH (ref 11.5–15.5)
WBC: 17.1 10*3/uL — ABNORMAL HIGH (ref 4.0–10.5)
nRBC: 0 % (ref 0.0–0.2)

## 2020-05-28 LAB — GLUCOSE, CAPILLARY
Glucose-Capillary: 122 mg/dL — ABNORMAL HIGH (ref 70–99)
Glucose-Capillary: 168 mg/dL — ABNORMAL HIGH (ref 70–99)
Glucose-Capillary: 124 mg/dL — ABNORMAL HIGH (ref 70–99)
Glucose-Capillary: 203 mg/dL — ABNORMAL HIGH (ref 70–99)

## 2020-05-28 LAB — PATHOLOGIST SMEAR REVIEW

## 2020-05-28 MED ORDER — FUROSEMIDE 20 MG PO TABS: 20.0000 mg | ORAL_TABLET | Freq: Two times a day (BID) | ORAL | Status: DC

## 2020-05-28 MED ORDER — PREDNISONE 50 MG PO TABS
60.0000 mg | ORAL_TABLET | Freq: Every day | ORAL | Status: AC
  Administered 2020-05-28 – 2020-06-01 (×5): 60 mg via ORAL
  Filled 2020-05-28 (×8): qty 1

## 2020-05-28 MED ORDER — HYDROXYZINE HCL 25 MG PO TABS
50.0000 mg | ORAL_TABLET | Freq: Three times a day (TID) | ORAL | Status: DC | PRN
  Administered 2020-05-28 – 2020-06-04 (×13): 50 mg via ORAL
  Filled 2020-05-28 (×14): qty 2

## 2020-05-28 MED ORDER — HYDROXYZINE HCL 25 MG PO TABS
50.0000 mg | ORAL_TABLET | Freq: Once | ORAL | Status: AC
  Administered 2020-05-28: 50 mg via ORAL
  Filled 2020-05-28: qty 2

## 2020-05-28 MED ORDER — EMPAGLIFLOZIN 10 MG PO TABS: 10.0000 mg | ORAL_TABLET | Freq: Every day | ORAL | Status: DC

## 2020-05-28 NOTE — Progress Notes (Signed)
Due to a bump in scr (eGFR<45), ok to hold empagliflozin for 2d per Dr Demaris Callander. Pt has gotten dose today so we will hold starting tomorrow.  Onnie Boer, PharmD, BCIDP, AAHIVP, CPP Infectious Disease Pharmacist 05/28/2020 12:18 PM

## 2020-05-28 NOTE — Hospital Course (Addendum)
Bullous Pemphigoid:  Patient presented with a erythroderma-like rash that is very pruritic with some bulla.  He has a very elevated peripheral eosinophils.  Also having worsening renal function with creatinine rising from 1.1 up to 1.65.  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  He was started on systemic steroids with prednisone 60 mg daily on 05/28/20.  Continued with supportive care including topical emollients and hydroxyzine for the itching. Stopped  pantoprazole and spironolactone as they are also listed as possible causes for DRESS. Most likely offending medication was allopurinol. His eosinophils started trending down and he was started on Clobetasol cream BID to thighs and abdomen on 11/03. Steroid taper started on 06/02/20 ~40 mg daily until 06/04/20 and sent home with further tapering instructions of 20 mg for 3 days ( until 06/07/20) and 10 mg for nest 2 days.Skin rash is looking better, much less pruritic, not totally resolved  Acute on chronic HFpEF exacerbation: Patient was on IV Lasix 60mg  twice daily on 05/25/20.His edema continued to improve and  he had no difficulty breathing.   He has had 1.6 L UOP on 11/1;  and down 2.4kg since admission. Echo with LVEF 60-65%, no RWMA, mild aortic regurgitation that is stable from prior exam.  Clinically started appearing close to euvolemic and had worsening renal function so held off on further diuresis. Likely to have increased insensible losses due to diffuse rash. His home dose was resumed on 11/06 as he had a ~1.4 kg weight gain and his renal function were stable. But that lead to AKI with increase in sCr to 1.73 from 1.12 .He has had good urine output. However, suspect that this dosing may be too high for him. Held off his lasix, leading to sCr trending down to 1.14.   Atrial fibrillation, rate controlled CHADSVASC2 score 4; HASBLED score 4 with history of GI bleed requiring blood transfusion when on anticoagulation with DAPT in  the past. Is currently rate controlled with beta blocker Carvediol 6.25 mg BID.  Diabetes Mellitus: HbA1c 6.9. He is on metformin at home. Patient started on SGLT-2i Jardiance to decrease frequency of CHF exacerbations in diabetes. Jardiance was stopped as his Creatinine crept up to 1.65.   Hypothermia:  He had a new onset mild hypothermia on 05/30/20 and it persisted until 06/02/20. His blood cultures have been negative to date. Suspect that his hypothermia was secondary to impaired thermoregulation due to disruption of the skin barrier from the bullous pemphigoid rash. He was being peripherally warmed during with warming blanket and his hypothermia improved. Initially he was reluctant to use warm blankets with the fear of exacerbation of itching & blistering.

## 2020-05-28 NOTE — Progress Notes (Addendum)
Subjective: Patient was given 1-time 50 mg hydroxyzine dose for itching at 1:30 am and said that 25 mg at 9:30 pm was not helpful.  This morning, patient evaluated at bedside. He states he has similar presentation every fall and usually Benadryl helps but this has been the worse presentation. He complains of extreme itching and notes excruciating pruritis that was somewhat relieved by overnight hydroxyzine for 1.5 hours.  Discussed that we are awaiting pathology results. He denies any dyspnea and states the swelling has been going down both in his hands and feet.  Objective:  Vital signs in last 24 hours: Vitals:   05/28/20 0054 05/28/20 0501 05/28/20 0827 05/28/20 1132  BP:  126/69 (!) 129/92 101/76  Pulse:  95 82 (!) 42  Resp:  _0 Temp:  (!) 97.5 F (36.4 C)  98.2 F (36.8 C)  TempSrc:  Oral    SpO2:  97% 98% 97%  Weight: (!) 136.2 kg     Height:       Physical Exam  Constitutional:   Appearance: He is obese. He is not ill-appearing.  Comments: Elderly male, lying in bed, NAD  HENT: Normocephalic and atraumatic, EOMI,  moist mucous membranes Respiratory: No respiratory distress, no accessory muscle use.  Effort is normal.  GI: Nondistended, soft, nontender to palpation, normal active bowel sounds; reducible umbilical hernia  Musculoskeletal: Normal bulk and tone.  1+ pitting edema bilaterally. Neurological: Is alert and oriented x4, no apparent focal deficits noted. Skin: Warm and dry.  Diffuse errythematous macular papular rash across all extremities and the abdomen, chest, back, and head with lots of erosions from scratching. One small bulla on the left inner thigh. No vesicles. Small silver scale on the arms.   Assessment/Plan:  Principal Problem:   DRESS syndrome Active Problems:   DM (diabetes mellitus), type 2 (HCC)   Atrial fibrillation (HCC)   Alcoholic cirrhosis of liver with ascites (HCC)   Acute on chronic heart failure [facility])  Brandon [name] is a 74  year old male with PMHx of DM, Atrial fibrillation (not on anticoagulation), cirrhosis with esophageal varices, congestive heart failure admitted for acute on chronic HFpEF exacerbation.    Drug-induced hypersensitivity syndrome (aka DRESS): Patient with a erythroderma-like rash that is very pruritic with some bulla.  He has a very elevated peripheral eosinophils.  Also having worsening renal function with creatinine rising from 1.1 up to 1.65.  Punch biopsy was obtained and waiting for results.  Given the worsening renal function, plan to start systemic steroids with prednisone 60 mg daily today.  Continue with supportive care including topical emollients and hydroxyzine for the itching. Check RPR and HIV status.  Most likely offending medication was allopurinol.  We will also stop pantoprazole and spironolactone as they are also listed as possible causes for DRESS.  Acute on chronic HFpEF exacerbation: Patient was on IV Lasix 52m twice daily.Today his edema is improving and he has no difficulty breathing.   He has had 1.6 L UOP overnight;  and down 2.4kg since admission. Echo with LVEF 60-65%, no RWMA, mild aortic regurgitation that is stable from prior exam.  Clinically appears close to euvolemic today.  Having worsening renal function so will hold off on further diuresis.  Likely to have increased insensible losses due to diffuse rash. - Start Lasix 20 mg bid ( home dose) - Discontinue potassium 14m daily  - Strict I&O - BMP daily    Atrial fibrillation, rate controlled CHADSVASC2  score 4; HASBLED score 4 with history of GI bleed requiring blood transfusion when on anticoagulation with DAPT in the past. Is currently rate controlled with beta blocker - Continue carvedilol 6.53m bid - Continue cardiac monitoring    Diabetes Mellitus: HbA1c 6.9. He is on metformin at home. Patient started on SGLT-2i yesterday to decrease frequency of CHF exacerbations in diabetes.  - Since his scr has crept  up to 1.65 so the eGFR<45,  we are going to temporarily hold his Jardiance for 2 days. - Continue CBG monitoring  - Continue SSI  Diet: HH/CM Fluids: None DVT Prophylaxis: SCDs, OOB as tolerated  Code status: DNR   Prior to Admission Living Arrangement: Home Anticipated Discharge Location: Home Barriers to Discharge: Continued medical management  Dispo: Anticipated discharge in approximately 1-2 day(s).   DHonor Junes MD 05/28/2020, 12:25 PM After 5pm on weekdays and 1pm on weekends: On Call pager [phone]

## 2020-05-29 LAB — CBC WITH DIFFERENTIAL/PLATELET
Abs Immature Granulocytes: 0.1 10*3/uL — ABNORMAL HIGH (ref 0.00–0.07)
Basophils Absolute: 0 10*3/uL (ref 0.0–0.1)
Basophils Relative: 0 %
Eosinophils Absolute: 1.2 10*3/uL — ABNORMAL HIGH (ref 0.0–0.5)
Eosinophils Relative: 10 %
HCT: 40.5 % (ref 39.0–52.0)
Hemoglobin: 12.8 g/dL — ABNORMAL LOW (ref 13.0–17.0)
Immature Granulocytes: 1 %
Lymphocytes Relative: 7 %
Lymphs Abs: 0.8 10*3/uL (ref 0.7–4.0)
MCH: 28.5 pg (ref 26.0–34.0)
MCHC: 31.6 g/dL (ref 30.0–36.0)
MCV: 90.2 fL (ref 80.0–100.0)
Monocytes Absolute: 0.3 10*3/uL (ref 0.1–1.0)
Monocytes Relative: 3 %
Neutro Abs: 8.7 10*3/uL — ABNORMAL HIGH (ref 1.7–7.7)
Neutrophils Relative %: 79 %
Platelets: 190 10*3/uL (ref 150–400)
RBC: 4.49 MIL/uL (ref 4.22–5.81)
RDW: 15.7 % — ABNORMAL HIGH (ref 11.5–15.5)
WBC: 11.1 10*3/uL — ABNORMAL HIGH (ref 4.0–10.5)
nRBC: 0 % (ref 0.0–0.2)

## 2020-05-29 LAB — COMPREHENSIVE METABOLIC PANEL
ALT: 18 U/L (ref 0–44)
AST: 18 U/L (ref 15–41)
Albumin: 2.5 g/dL — ABNORMAL LOW (ref 3.5–5.0)
Alkaline Phosphatase: 89 U/L (ref 38–126)
Anion gap: 10 (ref 5–15)
BUN: 38 mg/dL — ABNORMAL HIGH (ref 8–23)
CO2: 26 mmol/L (ref 22–32)
Calcium: 9 mg/dL (ref 8.9–10.3)
Chloride: 93 mmol/L — ABNORMAL LOW (ref 98–111)
Creatinine, Ser: 1.62 mg/dL — ABNORMAL HIGH (ref 0.61–1.24)
GFR, Estimated: 45 mL/min — ABNORMAL LOW (ref >60)
Glucose, Bld: 139 mg/dL — ABNORMAL HIGH (ref 70–99)
Potassium: 4.4 mmol/L (ref 3.5–5.1)
Sodium: 129 mmol/L — ABNORMAL LOW (ref 135–145)
Total Bilirubin: 0.7 mg/dL (ref 0.3–1.2)
Total Protein: 5.8 g/dL — ABNORMAL LOW (ref 6.5–8.1)

## 2020-05-29 LAB — RPR: RPR Ser Ql: NONREACTIVE

## 2020-05-29 LAB — HIV ANTIBODY (ROUTINE TESTING W REFLEX): HIV Screen 4th Generation wRfx: NONREACTIVE

## 2020-05-29 LAB — GLUCOSE, CAPILLARY
Glucose-Capillary: 116 mg/dL — ABNORMAL HIGH (ref 70–99)
Glucose-Capillary: 177 mg/dL — ABNORMAL HIGH (ref 70–99)
Glucose-Capillary: 149 mg/dL — ABNORMAL HIGH (ref 70–99)
Glucose-Capillary: 137 mg/dL — ABNORMAL HIGH (ref 70–99)

## 2020-05-29 MED ORDER — ENOXAPARIN SODIUM 40 MG/0.4ML ~~LOC~~ SOLN
40.0000 mg | SUBCUTANEOUS | Status: DC
  Administered 2020-05-29 – 2020-06-03 (×6): 40 mg via SUBCUTANEOUS
  Filled 2020-05-29 (×6): qty 0.4

## 2020-05-29 MED ORDER — HYDROXYZINE HCL 25 MG PO TABS
25.0000 mg | ORAL_TABLET | Freq: Once | ORAL | Status: AC
  Administered 2020-05-29: 25 mg via ORAL
  Filled 2020-05-29: qty 1

## 2020-05-29 NOTE — Progress Notes (Signed)
Subjective: HD# 4 Notes significant pruritis overnight that awoke him from sleep.  He has not been able to get out of bed yet as he uses a walker to ambulate at baseline. He has not been able to use this due to ongoing blisters on his hands. Discussed diagnosis of DRESS and treating with steroids. He does note that he feels slightly better. Lives at home with his mother and notes its a mutual dependency. Discussed getting out of bed into chair. He is agreeable to this.   Objective:  Vital signs in last 24 hours: Vitals:   05/28/20 1132 05/28/20 2007 05/29/20 0415 05/29/20 1120  BP: 101/76 (!) 121/59 (!) 124/94 117/74  Pulse: (!) 42 74 85 63  Resp: _0 Temp: 98.2 F (36.8 C) 98 F (36.7 C) 97.6 F (36.4 C) 98 F (36.7 C)  TempSrc:  Oral Oral   SpO2: 97% 97% 97% 95%  Weight:   (!) 301 lb 9.4 oz (136.8 kg)   Height:       CBC Latest Ref Rng & Units 05/29/2020 05/28/2020 05/27/2020  WBC 4.0 - 10.5 K/uL 11.1(H) 17.1(H) 17.3(H)  Hemoglobin 13.0 - 17.0 g/dL 12.8(L) 13.7 13.2  Hematocrit 39 - 52 % 40.5 43.3 42.1  Platelets 150 - 400 K/uL 190 221 223   BMP Latest Ref Rng & Units 05/29/2020 05/28/2020 05/27/2020  Glucose 70 - 99 mg/dL 139(H) 122(H) 230(H)  BUN 8 - 23 mg/dL 38(H) 32(H) 26(H)  Creatinine 0.61 - 1.24 mg/dL 1.62(H) 1.65(H) 1.51(H)  Sodium 135 - 145 mmol/L 129(L) 132(L) 132(L)  Potassium 3.5 - 5.1 mmol/L 4.4 4.4 4.0  Chloride 98 - 111 mmol/L 93(L) 94(L) 94(L)  CO2 22 - 32 mmol/L _1 Calcium 8.9 - 10.3 mg/dL 9.0 9.4 9.3    Physical Exam Constitutional:  Appearance: He is obese. He is notill-appearing.  Comments:Elderly male, lying in bed, NAD  HENT: Normocephalic and atraumatic, EOMI, moist mucous membranes Respiratory: No respiratory distress, no accessory muscle use. Effort is normal.  GI: Nondistended, soft, nontender to palpation, normal active bowel sounds; reducible umbilical hernia Musculoskeletal: Normal bulk and tone. 1+ pitting edema  bilaterally. Neurological: Is alert and oriented x4, no apparent focal deficits noted. Skin: Warm and dry.Diffuse errythematous macular papular rash across all extremities and the abdomen, chest, back, and head with lots of erosions from scratching. One small bulla on the left inner thigh. No vesicles. Small silver scale on the arms.  Assessment/Plan:  Principal Problem:   DRESS syndrome Active Problems:   DM (diabetes mellitus), type 2 (HCC)   Atrial fibrillation (HCC)   Alcoholic cirrhosis of liver with ascites (HCC)   Acute on chronic heart failure [facility]  Brandon [name] is a 74 year old male with PMHx of DM, Atrial fibrillation (not on anticoagulation), cirrhosis with esophageal varices, congestive heart failure admitted for acute on chronic HFpEF exacerbation.  Drug-induced hypersensitivity syndrome (aka DRESS): Patient is informed about possible diagnosis of DRESS and treatment with steroids. His eosinophils absolute count has decreased from 7.5 to 1.2. His WBC decreased from 17.1 to 1.1. His  creatinine is 1.62.  His RPR and HIV are non-reactive. Punch biopsy was obtained and waiting for results.  - Continue with supportive care including topical emollients and hydroxyzine for the itching. -  Continue Prednisone 60 mg  Acute on chronic HFpEFexacerbation: Patient's lasix were held yesterday along with other medications due to concern of DRESS.  Today he has no difficulty breathing.  He has had 1.75 L UOP overnight. Echo with LVEF 60-65%, no RWMA, mild aortic regurgitation that is stable from prior exam.  Clinically appears close to euvolemic today.  - Planning to start Lasix 20 mg bid ( home dose) tomorrow - Strict I&O - BMP daily   Atrial fibrillation, rate controlled CHADSVASC2 score 4; HASBLED score 4 with history of GI bleed requiring blood transfusion when on anticoagulation with DAPT in the past. Is currently rate controlled with beta blocker - Continue carvedilol  6.81m bid - Continue cardiac monitoring   Diabetes Mellitus: HbA1c 6.9. He is on metformin at home. Patient started on SGLT-2i yesterday to decrease frequency of CHF exacerbations in diabetes.  - Since his scr has crept up to 1.65 so the eGFR<45,  we are going to temporarily hold his Jardiance for 2 days. His CBG today is 139.  - Continue CBG monitoring  - Continue SSI  Prior to Admission Living Arrangement: Home Anticipated Discharge Location: Home Barriers to Discharge: Continued medical management Dispo: Anticipated discharge in approximately 2-3 days day(s).   DHonor Junes MD 05/29/2020, 1:02 PM Pager: [phone] 5pm on weekdays and 1pm on weekends: On Call pager [phone]

## 2020-05-29 NOTE — Evaluation (Signed)
Physical Therapy Evaluation Patient Details Name: Brandon [name] MRN: [id number] DOB: Oct 26, 1945 Today's Date: 05/29/2020   History of Present Illness  Pt is a 74 y/o male admitted secondary to CHF exacerbation and DRESS syndrome. PMH includes DM, a fib, alcoholic cirrhosis and colon cancer.   Clinical Impression  Pt admitted secondary to problem above with deficits below. Pt limited this session secondary to pain in BLE and mild lightheadedness. Pt reports feeling weaker since admission to hospital. Pt typically uses RW, however, reports it is more painful given blisters on his hands. Feel pt will likely progress well and be able to d/c home with HHPT services. However, it pt does not progress well, may require SNF level therapies. Will continue to follow acutely.     Follow Up Recommendations Supervision for mobility/OOB (HHPT vs SNF pending progression )    Equipment Recommendations  None recommended by PT    Recommendations for Other Services       Precautions / Restrictions Precautions Precautions: Fall Restrictions Weight Bearing Restrictions: No      Mobility  Bed Mobility Overal bed mobility: Needs Assistance Bed Mobility: Supine to Sit     Supine to sit: Supervision;HOB elevated     General bed mobility comments: Supervision for safety. Pt using bed rails.     Transfers Overall transfer level: Needs assistance Equipment used: Rolling walker (2 wheeled) Transfers: Sit to/from [facility] Sit to Stand: Min guard Stand pivot transfers: Min guard       General transfer comment: Min guard for safety throughout. Waddle type steps and pt reporting increased pain which limited mobility. Also reporting mild lightheadedness.   Ambulation/Gait                Stairs            Wheelchair Mobility    Modified Rankin (Stroke Patients Only)       Balance Overall balance assessment: Needs assistance Sitting-balance support: No upper  extremity supported;Feet supported Sitting balance-Leahy Scale: Good     Standing balance support: Bilateral upper extremity supported;During functional activity Standing balance-Leahy Scale: Poor Standing balance comment: Reliant on BUE support                              Pertinent Vitals/Pain Pain Assessment: 0-10 Pain Score: 7  Pain Location: back, R foot, L hand  Pain Descriptors / Indicators: Aching Pain Intervention(s): Limited activity within patient's tolerance;Monitored during session;Repositioned    Home Living Family/patient expects to be discharged to:: Private residence Living Arrangements: Parent Available Help at Discharge: Family;Available 24 hours/day Type of Home: Apartment Home Access: Level entry     Home Layout: One level Home Equipment: Walker - 2 wheels;Tub bench;Grab bars - tub/shower;Grab bars - toilet;Toilet riser      Prior Function Level of Independence: Independent with assistive device(s)         Comments: walks with RW; sponge bathes      Hand Dominance        Extremity/Trunk Assessment   Upper Extremity Assessment Upper Extremity Assessment: Defer to OT evaluation    Lower Extremity Assessment Lower Extremity Assessment: Generalized weakness;RLE deficits/detail (lesions throughout BLE) RLE Deficits / Details: 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    Cervical / Trunk Assessment Cervical / Trunk Assessment: Normal  Communication   Communication: No difficulties  Cognition Arousal/Alertness: Awake/alert Behavior During Therapy: WFL for tasks assessed/performed Overall Cognitive Status: Within Functional Limits for tasks  assessed                                        General Comments      Exercises     Assessment/Plan    PT Assessment Patient needs continued PT services  PT Problem List Decreased strength;Decreased balance;Decreased mobility;Decreased activity tolerance;Decreased knowledge of  use of DME;Pain       PT Treatment Interventions Gait training;DME instruction;Therapeutic activities;Functional mobility training;Balance training;Therapeutic exercise;Patient/family education    PT Goals (Current goals can be found in the Care Plan section)  Acute Rehab PT Goals Patient Stated Goal: to be able to walk better PT Goal Formulation: With patient Time For Goal Achievement: 06/12/20 Potential to Achieve Goals: Good    Frequency Min 3X/week   Barriers to discharge        Co-evaluation               AM-PAC PT "6 Clicks" Mobility  Outcome Measure Help needed turning from your back to your side while in a flat bed without using bedrails?: None Help needed moving from lying on your back to sitting on the side of a flat bed without using bedrails?: A Little Help needed moving to and from a bed to a chair (including a wheelchair)?: A Little Help needed standing up from a chair using your arms (e.g., wheelchair or bedside chair)?: A Little Help needed to walk in hospital room?: A Little Help needed climbing 3-5 steps with a railing? : A Lot 6 Click Score: 18    End of Session   Activity Tolerance: Patient limited by pain;Treatment limited secondary to medical complications (Comment) (lightheadedness ) Patient left: in chair;with call bell/phone within reach Nurse Communication: Mobility status PT Visit Diagnosis: Unsteadiness on feet (R26.81);Muscle weakness (generalized) (M62.81);Pain Pain - Right/Left:  (bilateral) Pain - part of body: Leg    Time: 1011-1031 PT Time Calculation (min) (ACUTE ONLY): 20 min   Charges:   PT Evaluation $PT Eval Moderate Complexity: 1 Mod          Reuel Derby, PT, DPT  Acute Rehabilitation Services  Pager: [phone] Office: [phone]   Rudean Hitt 05/29/2020, 10:53 AM

## 2020-05-29 NOTE — Progress Notes (Addendum)
   05/29/20 1956  Assess: MEWS Score  Temp (!) 93.8 F (34.3 C)  BP 138/77  Pulse Rate 82  Resp 20  SpO2 96 %  O2 Device Room Air  Assess: MEWS Score  MEWS Temp 2  MEWS Systolic 0  MEWS Pulse 0  MEWS RR 0  MEWS LOC 0  MEWS Score 2  MEWS Score Color Yellow  Assess: if the MEWS score is Yellow or Red  Were vital signs taken at a resting state? Yes  Focused Assessment No change from prior assessment  Early Detection of Sepsis Score *See Row Information* Medium  MEWS guidelines implemented *See Row Information* Yes  Treat  Pain Scale 0-10  Pain Score 7  Pain Intervention(s) Refused (requesting tylenol at bedtime)  Take Vital Signs  Increase Vital Sign Frequency  Yellow: Q 2hr X 2 then Q 4hr X 2, if remains yellow, continue Q 4hrs  Escalate  MEWS: Escalate Yellow: discuss with charge nurse/RN and consider discussing with provider and RRT  Notify: Charge Nurse/RN  Name of Charge Nurse/RN Notified [place], RN  Date Charge Nurse/RN Notified 05/29/20  Time Charge Nurse/RN Notified 2043  Notify: Provider  Provider Name/Title [place]  Date Provider Notified 05/29/20  Time Provider Notified 2044  Notification Type Page  Notification Reason Change in status  Response Other (Comment) (recheck in an hour)  Date of Provider Response 05/29/20  Time of Provider Response 2049  Document  Patient Outcome Other (Comment) (see note)  Progress note created (see row info) Yes   Patient yellow MEWS due to low rectal temp.  Yellow MEWS protocol initiated.  MD notified.  MD requesed to recheck temp in one hour.  Attempted warm blanket but patient states heat makes his rash/blisters worse.  Repeat temp still low at 94.36F.  MD notified and came to bedside to see patient.  Suggested to follow protocol and notify if patient temp drops further.  Patient currently alert and oriented all other VSS.  Will continue yellow protocol.

## 2020-05-30 LAB — CBC WITH DIFFERENTIAL/PLATELET
Abs Immature Granulocytes: 0.1 10*3/uL — ABNORMAL HIGH (ref 0.00–0.07)
Basophils Absolute: 0 10*3/uL (ref 0.0–0.1)
Basophils Relative: 0 %
Eosinophils Absolute: 2 10*3/uL — ABNORMAL HIGH (ref 0.0–0.5)
Eosinophils Relative: 17 %
HCT: 38.9 % — ABNORMAL LOW (ref 39.0–52.0)
Hemoglobin: 12.4 g/dL — ABNORMAL LOW (ref 13.0–17.0)
Immature Granulocytes: 1 %
Lymphocytes Relative: 7 %
Lymphs Abs: 0.8 10*3/uL (ref 0.7–4.0)
MCH: 28.2 pg (ref 26.0–34.0)
MCHC: 31.9 g/dL (ref 30.0–36.0)
MCV: 88.6 fL (ref 80.0–100.0)
Monocytes Absolute: 0.6 10*3/uL (ref 0.1–1.0)
Monocytes Relative: 5 %
Neutro Abs: 8.6 10*3/uL — ABNORMAL HIGH (ref 1.7–7.7)
Neutrophils Relative %: 70 %
Platelets: 173 10*3/uL (ref 150–400)
RBC: 4.39 MIL/uL (ref 4.22–5.81)
RDW: 15.5 % (ref 11.5–15.5)
WBC: 12.2 10*3/uL — ABNORMAL HIGH (ref 4.0–10.5)
nRBC: 0 % (ref 0.0–0.2)

## 2020-05-30 LAB — COMPREHENSIVE METABOLIC PANEL
ALT: 20 U/L (ref 0–44)
AST: 33 U/L (ref 15–41)
Albumin: 2.7 g/dL — ABNORMAL LOW (ref 3.5–5.0)
Alkaline Phosphatase: 90 U/L (ref 38–126)
Anion gap: 10 (ref 5–15)
BUN: 40 mg/dL — ABNORMAL HIGH (ref 8–23)
CO2: 26 mmol/L (ref 22–32)
Calcium: 9.3 mg/dL (ref 8.9–10.3)
Chloride: 94 mmol/L — ABNORMAL LOW (ref 98–111)
Creatinine, Ser: 1.38 mg/dL — ABNORMAL HIGH (ref 0.61–1.24)
GFR, Estimated: 54 mL/min — ABNORMAL LOW (ref >60)
Glucose, Bld: 129 mg/dL — ABNORMAL HIGH (ref 70–99)
Potassium: 4.9 mmol/L (ref 3.5–5.1)
Sodium: 130 mmol/L — ABNORMAL LOW (ref 135–145)
Total Bilirubin: 0.8 mg/dL (ref 0.3–1.2)
Total Protein: 5.9 g/dL — ABNORMAL LOW (ref 6.5–8.1)

## 2020-05-30 LAB — GLUCOSE, CAPILLARY
Glucose-Capillary: 115 mg/dL — ABNORMAL HIGH (ref 70–99)
Glucose-Capillary: 165 mg/dL — ABNORMAL HIGH (ref 70–99)
Glucose-Capillary: 149 mg/dL — ABNORMAL HIGH (ref 70–99)
Glucose-Capillary: 155 mg/dL — ABNORMAL HIGH (ref 70–99)

## 2020-05-30 MED ORDER — HYDROXYZINE HCL 25 MG PO TABS
25.0000 mg | ORAL_TABLET | Freq: Once | ORAL | Status: AC
  Administered 2020-05-30: 25 mg via ORAL
  Filled 2020-05-30: qty 1

## 2020-05-30 MED ORDER — CLOBETASOL PROPIONATE 0.05 % EX CREA
TOPICAL_CREAM | Freq: Two times a day (BID) | CUTANEOUS | Status: DC
  Filled 2020-05-30 (×4): qty 15

## 2020-05-30 NOTE — Care Management Important Message (Signed)
Important Message  Patient Details  Name: Brandon [name] MRN: [id number] Date of Birth: 02-Apr-1946   Medicare Important Message Given:  Yes     Shelda Altes 05/30/2020, 9:00 AM

## 2020-05-30 NOTE — Evaluation (Signed)
Occupational Therapy Evaluation Patient Details Name: Brandon [name] MRN: [id number] DOB: 1945-09-20 Today's Date: 05/30/2020    History of Present Illness Pt is a 74 y/o male admitted secondary to CHF exacerbation and DRESS syndrome. PMH includes DM, a fib, alcoholic cirrhosis and colon cancer.    Clinical Impression   Pt. Lives with elderly mother. Pt. States that he does not drive and is mostly at home. Pt. Has son that lives nearly that can assist if asked. Pt. States that he does not want to go to snf and does want to go home. Pt. Does want to continue with ot in hospital and have [place]. actute OT to follow.     Follow Up Recommendations  Home health OT    Equipment Recommendations  None recommended by OT    Recommendations for Other Services       Precautions / Restrictions Precautions Precautions: Fall Restrictions Weight Bearing Restrictions: No      Mobility Bed Mobility Overal bed mobility: Needs Assistance Bed Mobility: Sit to Supine     Supine to sit: Supervision;HOB elevated Sit to supine: Supervision        Transfers Overall transfer level: Needs assistance Equipment used: Rolling walker (2 wheeled) Transfers: Sit to/from Stand Sit to Stand: Min guard Stand pivot transfers: Min guard       General transfer comment: min guard for safety    Balance Overall balance assessment: Needs assistance Sitting-balance support: No upper extremity supported;Feet supported Sitting balance-Leahy Scale: Good     Standing balance support: Bilateral upper extremity supported;During functional activity Standing balance-Leahy Scale: Fair Standing balance comment: Reliant on BUE support                            ADL either performed or assessed with clinical judgement   ADL Overall ADL's : Needs assistance/impaired Eating/Feeding: Independent   Grooming: Wash/dry hands;Wash/dry face;Min guard;Standing   Upper Body Bathing: Sitting;Set up   Lower  Body Bathing: Minimal assistance;Sit to/from stand   Upper Body Dressing : Set up;Sitting   Lower Body Dressing: Minimal assistance;Sit to/from stand   Toilet Transfer: Min guard;BSC;Stand-pivot   Toileting- Water quality scientist and Hygiene: Sit to/from stand;Min guard       Functional mobility during ADLs: Min guard General ADL Comments: Pt. does not wear socks and wears sandles. He states he can not get anyother kind of shoe on.      Vision Baseline Vision/History: Wears glasses Wears Glasses: Reading only Patient Visual Report: No change from baseline Vision Assessment?: No apparent visual deficits     Perception     Praxis      Pertinent Vitals/Pain Pain Assessment: 0-10 Pain Score: 7  Faces Pain Scale: Hurts little more Pain Location: r shld, r knee Pain Descriptors / Indicators: Aching Pain Intervention(s): Limited activity within patient's tolerance;Patient requesting pain meds-RN notified     Hand Dominance Right   Extremity/Trunk Assessment Upper Extremity Assessment Upper Extremity Assessment: RUE deficits/detail RUE Deficits / Details: r shld flex 90 degrees. Pt. states it has been that way for 20 years.  RUE Sensation: WNL RUE Coordination: WNL   Lower Extremity Assessment Lower Extremity Assessment: Defer to PT evaluation   Cervical / Trunk Assessment Cervical / Trunk Assessment: Normal   Communication Communication Communication: No difficulties   Cognition Arousal/Alertness: Awake/alert Behavior During Therapy: WFL for tasks assessed/performed Overall Cognitive Status: Within Functional Limits for tasks assessed  General Comments       Exercises Exercises: General Lower Extremity;Other exercises General Exercises - Lower Extremity Long Arc Quad: AROM;Both;10 reps;Seated Hip Flexion/Marching: AROM;Both;10 reps;Seated Other Exercises Other Exercises: sit to stand x 2 with RW and min  guard   Shoulder Instructions      Home Living Family/patient expects to be discharged to:: Private residence Living Arrangements: Parent Available Help at Discharge: Family;Available 24 hours/day Type of Home: Apartment Home Access: Level entry     Home Layout: One level     Bathroom Shower/Tub: Teacher, early years/pre: Handicapped height Bathroom Accessibility: Yes How Accessible: Accessible via walker Home Equipment: [place] - 2 wheels;Tub bench;Grab bars - tub/shower;Grab bars - toilet;Toilet riser   Additional Comments: Pt. has food and medicine delivered.      Prior Functioning/Environment Level of Independence: Independent with assistive device(s)        Comments: walks with RW; sponge bathes         OT Problem List: Decreased activity tolerance;Decreased knowledge of use of DME or AE      OT Treatment/Interventions: Self-care/ADL training;DME and/or AE instruction;Therapeutic activities;Patient/family education    OT Goals(Current goals can be found in the care plan section) Acute Rehab OT Goals Patient Stated Goal: to go home OT Goal Formulation: With patient Time For Goal Achievement: 06/13/20 Potential to Achieve Goals: Good ADL Goals Pt Will Perform Grooming: with modified independence;standing Pt Will Perform Upper Body Bathing: with modified independence;sitting Pt Will Perform Lower Body Bathing: with modified independence;sit to/from stand Pt Will Perform Upper Body Dressing: with modified independence;sitting Pt Will Perform Lower Body Dressing: with modified independence;sit to/from stand Pt Will Transfer to Toilet: with modified independence;ambulating  OT Frequency: Min 2X/week   Barriers to D/C: Decreased caregiver support          Co-evaluation              AM-PAC OT "6 Clicks" Daily Activity     Outcome Measure Help from another person eating meals?: None Help from another person taking care of personal grooming?: A  Little Help from another person toileting, which includes using toliet, bedpan, or urinal?: A Little Help from another person bathing (including washing, rinsing, drying)?: A Little Help from another person to put on and taking off regular upper body clothing?: A Little Help from another person to put on and taking off regular lower body clothing?: A Little 6 Click Score: 19   End of Session Equipment Utilized During Treatment: Rolling walker Nurse Communication:  (ok therapy)  Activity Tolerance: Patient tolerated treatment well Patient left: in bed;with call bell/phone within reach;with bed alarm set  OT Visit Diagnosis: Unsteadiness on feet (R26.81)                Time: 4696-2952 OT Time Calculation (min): 39 min Charges:  OT General Charges $OT Visit: 1 Visit OT Evaluation $OT Eval Low Complexity: 1 Low OT Treatments $Self Care/Home Management : 8-22 mins  Reece Packer OT/L   Joylyn Duggin 05/30/2020, 10:27 AM

## 2020-05-30 NOTE — Progress Notes (Signed)
Physical Therapy Treatment Patient Details Name: Brandon [name] MRN: [id number] DOB: 1946/03/08 Today's Date: 05/30/2020    History of Present Illness Pt is a 74 y/o male admitted secondary to CHF exacerbation and DRESS syndrome. PMH includes DM, a fib, alcoholic cirrhosis and colon cancer.     PT Comments    Patient progressing towards physical therapy goals. Patient deferred transfer to chair this morning, but wants to get in chair later today. Patient agreeable to EOB exercises. Patient performed functional exercises seated EOB and performed sit to stand x 2 with RW and min guard. Patient sidestepped 2' to [facility] with min guard and RW. Patient highly motivated to participate with therapy. Discharge recommendation HHPT vs SNF pending progression with physical therapy. PT will continue to follow.   Follow Up Recommendations  Supervision for mobility/OOB (HHPT vs SNF pending progression)     Equipment Recommendations  None recommended by PT    Recommendations for Other Services       Precautions / Restrictions Precautions Precautions: Fall Restrictions Weight Bearing Restrictions: No    Mobility  Bed Mobility Overal bed mobility: Needs Assistance Bed Mobility: Supine to Sit     Supine to sit: Supervision;HOB elevated (HOB max elevated and use of bed rails)        Transfers Overall transfer level: Needs assistance Equipment used: Rolling Mitzi Lilja (2 wheeled) Transfers: Sit to/from Stand Sit to Stand: Min guard         General transfer comment: min guard for safety  Ambulation/Gait Ambulation/Gait assistance: Min guard Gait Distance (Feet): 2 Feet Assistive device: Rolling Ellen Goris (2 wheeled)       General Gait Details: sidesteps to [place]             Wheelchair Mobility    Modified Rankin (Stroke Patients Only)       Balance Overall balance assessment: Needs assistance Sitting-balance support: No upper extremity supported;Feet  supported Sitting balance-Leahy Scale: Good     Standing balance support: Bilateral upper extremity supported;During functional activity Standing balance-Leahy Scale: Poor Standing balance comment: Reliant on BUE support                             Cognition Arousal/Alertness: Awake/alert Behavior During Therapy: WFL for tasks assessed/performed Overall Cognitive Status: Within Functional Limits for tasks assessed                                        Exercises General Exercises - Lower Extremity Long Arc Quad: AROM;Both;10 reps;Seated Hip Flexion/Marching: AROM;Both;10 reps;Seated Other Exercises Other Exercises: sit to stand x 2 with RW and min guard    General Comments        Pertinent Vitals/Pain Pain Assessment: Faces Faces Pain Scale: Hurts little more Pain Location: pt states all over Pain Descriptors / Indicators: Aching Pain Intervention(s): Limited activity within patient's tolerance;Monitored during session;Repositioned    Home Living                      Prior Function            PT Goals (current goals can now be found in the care plan section) Acute Rehab PT Goals Patient Stated Goal: to be able to walk better PT Goal Formulation: With patient Time For Goal Achievement: 06/12/20 Potential to Achieve Goals: Good Progress towards  PT goals: Progressing toward goals    Frequency    Min 3X/week      PT Plan Current plan remains appropriate    Co-evaluation              AM-PAC PT "6 Clicks" Mobility   Outcome Measure  Help needed turning from your back to your side while in a flat bed without using bedrails?: None Help needed moving from lying on your back to sitting on the side of a flat bed without using bedrails?: A Little Help needed moving to and from a bed to a chair (including a wheelchair)?: A Little Help needed standing up from a chair using your arms (e.g., wheelchair or bedside chair)?: A  Little Help needed to walk in hospital room?: A Little Help needed climbing 3-5 steps with a railing? : A Lot 6 Click Score: 18    End of Session   Activity Tolerance: Patient limited by pain;Other (comment) (limited by lightheadedness ) Patient left: in bed;with call bell/phone within reach Nurse Communication: Mobility status PT Visit Diagnosis: Unsteadiness on feet (R26.81);Muscle weakness (generalized) (M62.81);Pain Pain - Right/Left:  (bilateral) Pain - part of body: Leg;Hand     Time: 8850-2774 PT Time Calculation (min) (ACUTE ONLY): 15 min  Charges:  $Therapeutic Activity: 8-22 mins                     Perrin Maltese, PT, DPT Acute Rehabilitation Services Pager [phone] Office [phone]    Melene Plan Allred 05/30/2020, 8:51 AM

## 2020-05-30 NOTE — Progress Notes (Signed)
Subjective: HD#5 Overnight, noted to be hypothermic.   This morning, notes persistent pruritis. Does note that this is better than presentation. Discussed treatment with topical and oral steroids.  Rash since 2007 (around fall time, lasts for several months). Has not been able to follow up with dermatologist. Patient lives in [facility] but has not been able to find a dermatologist in [place]. He uses cab for transportation and is unable to see most providers outside of [employer].   Objective:  Vital signs in last 24 hours: Vitals:   05/29/20 2210 05/29/20 2343 05/30/20 0343 05/30/20 1100  BP: 126/82 128/63 125/65   Pulse:  73 77   Resp:  _0 Temp:  (!) 94.1 F (34.5 C) (!) 93.4 F (34.1 C)   TempSrc:  Rectal Oral   SpO2:  97% 98% 97%  Weight:   (!) 301 lb 5.9 oz (136.7 kg)   Height:       Physical Exam Constitutional:  Appearance: He is obese. He is notill-appearing.  Comments:Elderly male, lying in bed, NAD HENT: Normocephalic and atraumatic, EOMI, moist mucous membranes Respiratory: No respiratory distress, no accessory muscle use. Effort is normal.  GI: Nondistended, soft, nontender to palpation, normal active bowel sounds; reducible umbilical hernia Musculoskeletal: Normal bulk and tone. 1+ pitting edemabilaterally. Neurological: Is alert and oriented x4, no apparent focal deficits noted. Skin: Warm and dry.Diffuseerrythematous macular papular rashacross all extremities and the abdomen, chest, back, and headwith lots of erosions from scratching.One small bulla on the left inner thigh ruptured this morning. No vesicles. Small silver scale on the arms.  Assessment/Plan: [name] is a 74 year old male with PMHx of DM, Atrial fibrillation (not on anticoagulation), cirrhosis with esophageal varices, congestive heart failure admitted for acute on chronic HFpEF exacerbation.  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 Patient is informed about it and he shows good understanding of this diagnosis and treatment. Plan to continue with prednisone prolonged taper and add topical steroids. He has mild hypothermia 93.4, possibly because of skin disease/ anxiolytics and planning to continue monitor it.  -  Continue Prednisone 60 mg -  Start Clobetasol cream BID to thighs and abdomen.  - Continue with supportive care including topical emollientsand hydroxyzine for the itching.  Acute on chronic HFpEFexacerbation: Patient's lasix were held and chronic heart failure with preserved ejection fraction seems well compensated today. Today he has no difficulty breathing.He has had 1.75L UOP overnight. Echo with LVEF 60-65%, no RWMA, mild aortic regurgitation that is stable from prior exam.Clinically appears close to euvolemic today.   - Strict I&O - BMP daily   Atrial fibrillation, rate controlled CHADSVASC2 score 4; HASBLED score 4 with history of GI bleed requiring blood transfusion when on anticoagulation with DAPT in the past. Is currently rate controlled with beta blocker - Continue carvedilol 6.35m bid - Continue cardiac monitoring   Diabetes Mellitus: HbA1c 6.9. He is on metformin at home. Patient started on SGLT-2i yesterday to decrease frequency of CHF exacerbations in diabetes.  -Since his scr has crept up to 1.65 so the eGFR<45,weare going totemporarily hold his Jardiance for 2 days. His CBG today is 139.  - Continue CBG monitoring  - Continue  SSI  Prior to Admission Living Arrangement: Home Anticipated Discharge Location: Home Barriers to Discharge: Continued medical management Dispo: Anticipated discharge in approximately 1-2 day(s).   Honor Junes, MD 05/30/2020, 11:17 AM Pager: [phone] After 5pm on weekdays and 1pm on weekends: On Call pager [phone]

## 2020-05-31 ENCOUNTER — Other Ambulatory Visit: Payer: Self-pay

## 2020-05-31 DIAGNOSIS — T68XXXA Hypothermia, initial encounter: Secondary | ICD-10-CM | POA: Diagnosis not present

## 2020-05-31 LAB — COMPREHENSIVE METABOLIC PANEL
ALT: 24 U/L (ref 0–44)
AST: 34 U/L (ref 15–41)
Albumin: 2.7 g/dL — ABNORMAL LOW (ref 3.5–5.0)
Alkaline Phosphatase: 109 U/L (ref 38–126)
Anion gap: 9 (ref 5–15)
BUN: 40 mg/dL — ABNORMAL HIGH (ref 8–23)
CO2: 26 mmol/L (ref 22–32)
Calcium: 9.2 mg/dL (ref 8.9–10.3)
Chloride: 96 mmol/L — ABNORMAL LOW (ref 98–111)
Creatinine, Ser: 1.44 mg/dL — ABNORMAL HIGH (ref 0.61–1.24)
GFR, Estimated: 51 mL/min — ABNORMAL LOW (ref >60)
Glucose, Bld: 117 mg/dL — ABNORMAL HIGH (ref 70–99)
Potassium: 5 mmol/L (ref 3.5–5.1)
Sodium: 131 mmol/L — ABNORMAL LOW (ref 135–145)
Total Bilirubin: 0.6 mg/dL (ref 0.3–1.2)
Total Protein: 6 g/dL — ABNORMAL LOW (ref 6.5–8.1)

## 2020-05-31 LAB — GLUCOSE, CAPILLARY
Glucose-Capillary: 106 mg/dL — ABNORMAL HIGH (ref 70–99)
Glucose-Capillary: 156 mg/dL — ABNORMAL HIGH (ref 70–99)
Glucose-Capillary: 148 mg/dL — ABNORMAL HIGH (ref 70–99)
Glucose-Capillary: 122 mg/dL — ABNORMAL HIGH (ref 70–99)

## 2020-05-31 LAB — CBC WITH DIFFERENTIAL/PLATELET
Abs Immature Granulocytes: 0 10*3/uL (ref 0.00–0.07)
Basophils Absolute: 0 10*3/uL (ref 0.0–0.1)
Basophils Relative: 0 %
Eosinophils Absolute: 1.4 10*3/uL — ABNORMAL HIGH (ref 0.0–0.5)
Eosinophils Relative: 12 %
HCT: 39.4 % (ref 39.0–52.0)
Hemoglobin: 12.3 g/dL — ABNORMAL LOW (ref 13.0–17.0)
Lymphocytes Relative: 6 %
Lymphs Abs: 0.7 10*3/uL (ref 0.7–4.0)
MCH: 28.2 pg (ref 26.0–34.0)
MCHC: 31.2 g/dL (ref 30.0–36.0)
MCV: 90.4 fL (ref 80.0–100.0)
Monocytes Absolute: 0.1 10*3/uL (ref 0.1–1.0)
Monocytes Relative: 1 %
Neutro Abs: 9.7 10*3/uL — ABNORMAL HIGH (ref 1.7–7.7)
Neutrophils Relative %: 81 %
Platelets: 165 10*3/uL (ref 150–400)
RBC: 4.36 MIL/uL (ref 4.22–5.81)
RDW: 15.8 % — ABNORMAL HIGH (ref 11.5–15.5)
WBC: 12 10*3/uL — ABNORMAL HIGH (ref 4.0–10.5)
nRBC: 0 % (ref 0.0–0.2)
nRBC: 0 /100 WBC

## 2020-05-31 NOTE — Progress Notes (Signed)
Physical Therapy Treatment Patient Details Name: Brandon [name] MRN: [id number] DOB: 16-May-1946 Today's Date: 05/31/2020    History of Present Illness Pt is a 74 y/o male admitted secondary to CHF exacerbation and DRESS syndrome. PMH includes DM, a fib, alcoholic cirrhosis and colon cancer.     PT Comments    Patient progressing towards physical therapy goals this session. Patient making good gains with ambulation with ability to ambulate 76' with RW and supervision. Patient requires supervision for all mobility for safety. Educated patient on importance of mobility when returning home to build endurance back up. Patient continues to be limited by pain, decreased activity tolerance, generalized weakness, impaired balance, and impaired functional mobility. Recommend HHPT following discharge to maximize functional mobility and independence.     Follow Up Recommendations  Home health PT;Supervision for mobility/OOB     Equipment Recommendations  None recommended by PT    Recommendations for Other Services       Precautions / Restrictions Precautions Precautions: Fall    Mobility  Bed Mobility Overal bed mobility: Needs Assistance Bed Mobility: Supine to Sit;Sit to Supine     Supine to sit: Supervision;HOB elevated Sit to supine: Supervision;HOB elevated      Transfers Overall transfer level: Needs assistance Equipment used: Rolling Treavon Castilleja (2 wheeled) Transfers: Sit to/from Stand Sit to Stand: Supervision         General transfer comment: supervision for safety. use of momentum  Ambulation/Gait Ambulation/Gait assistance: Supervision Gait Distance (Feet): 40 Feet Assistive device: Rolling Jean Alejos (2 wheeled) Gait Pattern/deviations: Step-through pattern;Wide base of support         Stairs             Wheelchair Mobility    Modified Rankin (Stroke Patients Only)       Balance Overall balance assessment: Needs assistance Sitting-balance support: No  upper extremity supported;Feet supported Sitting balance-Leahy Scale: Good     Standing balance support: Bilateral upper extremity supported;During functional activity Standing balance-Leahy Scale: Fair Standing balance comment: Reliant on BUE support                             Cognition Arousal/Alertness: Awake/alert Behavior During Therapy: WFL for tasks assessed/performed Overall Cognitive Status: Within Functional Limits for tasks assessed                                        Exercises      General Comments        Pertinent Vitals/Pain Pain Assessment: Faces Faces Pain Scale: Hurts a little bit Pain Location: general Pain Descriptors / Indicators: Aching Pain Intervention(s): Limited activity within patient's tolerance;Monitored during session;Repositioned    Home Living                      Prior Function            PT Goals (current goals can now be found in the care plan section) Acute Rehab PT Goals Patient Stated Goal: Hopefuly go home and get back to my self PT Goal Formulation: With patient Time For Goal Achievement: 06/12/20 Potential to Achieve Goals: Good Progress towards PT goals: Progressing toward goals    Frequency    Min 3X/week      PT Plan Current plan remains appropriate    Co-evaluation  AM-PAC PT "6 Clicks" Mobility   Outcome Measure  Help needed turning from your back to your side while in a flat bed without using bedrails?: None Help needed moving from lying on your back to sitting on the side of a flat bed without using bedrails?: A Little Help needed moving to and from a bed to a chair (including a wheelchair)?: A Little Help needed standing up from a chair using your arms (e.g., wheelchair or bedside chair)?: A Little Help needed to walk in hospital room?: A Little Help needed climbing 3-5 steps with a railing? : A Lot 6 Click Score: 18    End of Session Equipment  Utilized During Treatment: Gait belt Activity Tolerance: Patient tolerated treatment well Patient left: in bed;with call bell/phone within reach Nurse Communication: Mobility status PT Visit Diagnosis: Unsteadiness on feet (R26.81);Muscle weakness (generalized) (M62.81);Pain     Time: 1431-1454 PT Time Calculation (min) (ACUTE ONLY): 23 min  Charges:  $Therapeutic Activity: 23-37 mins                     Perrin Maltese, PT, DPT Acute Rehabilitation Services Pager [phone] Office [phone]    Brandon [name] 05/31/2020, 3:12 PM

## 2020-05-31 NOTE — Progress Notes (Signed)
Subjective: HD#6 Overnight, noted to be hypothermic and needed hydroxyzine 50 mg at midnight for itching. He states he feels better today but understands need to stay in hospital due to his limited functional mobility. Working with OT- able to get in chair from bed, trying to walk but having difficulty given blisters on his hands. Discussed drawing blood for culture due to ongoing hypothermia, shows good understanding about it.    Objective:  Vital signs in last 24 hours: Vitals:   05/31/20 0007 05/31/20 0429 05/31/20 0450 05/31/20 0917  BP: 115/62 129/75 129/71 108/67  Pulse: 85 74 69 78  Resp:  18 18   Temp:   (!) 94.3 F (34.6 C) (!) 94.4 F (34.7 C)  TempSrc: Oral  Axillary Axillary  SpO2:  96% 97% 98%  Weight:   296 lb 8.3 oz (134.5 kg)   Height:       Physical Exam Constitutional:  Appearance: He is obese. He is notill-appearing.  Comments:Elderly male, lying in bed, NAD HENT: Normocephalic and atraumatic, EOMI, moist mucous membranes Respiratory: No respiratory distress, no accessory muscle use. Effort is normal.  Musculoskeletal: Normal bulk and tone. 1+ pitting edemabilaterally. Neurological: Is alert and oriented x4, no apparent focal deficits noted. Skin: Warm and dry.Diffuseerrythematous macular papular rashacross all extremities and the abdomen, chest, back, and headwith lots of erosions from scratching.No vesicles. Small silver scale on the arms.  Assessment/Plan:  [name] is a 74 year old male with PMHx of DM, Atrial fibrillation (not on anticoagulation), cirrhosis with esophageal varices, congestive heart failure admitted for acute on chronic HFpEF exacerbation and diffuse rash/ Bullous Pemphigoid.   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 He has continued complaints of rash, itching which limits his ADL's and his functional mobility.  - Continue Prednisone 60 mg -  Continue Clobetasol cream BID to thighs and abdomen.  -Continue with supportive care including topical emollientsand hydroxyzine for the itching.  Hypothermia: He has persistent hypothermia 94.3 likely due to the diffuse rash impairing his thermoregulation. He cannot tolerates the warming blanket because of the rash and realizes it would make itching worse. Clinically he appears comfortable and stable but as it is persistent so planning to do blood culture for possible infection.  - Blood culture *2   Acute on chronic HFpEFexacerbation: Patient's lasix were held and chronic heart failure with preserved ejection fraction seems well compensated today. Todayhe hasno difficulty breathing.Echo with LVEF 60-65%, no RWMA, mild aortic regurgitation that is stable from prior exam.Clinically appears close to euvolemic today.   - Strict I&O - BMP daily   Atrial fibrillation, rate controlled CHADSVASC2 score 4; HASBLED score 4 with history of GI bleed requiring blood transfusion when on anticoagulation with DAPT in the past. Is currently rate controlled with beta blocker - Continue carvedilol 6.25mg  bid - Continue cardiac monitoring   Diabetes Mellitus: HbA1c 6.9. He is on metformin at home. Patient started on SGLT-2i yesterday to decrease frequency of CHF exacerbations in diabetes.  -Since his scr has crept up to 1.65 so the eGFR<45,weare going totemporarily hold his Jardiance for 2 days.His CBG today is 139. - Continue CBG monitoring  - Continue SSI  Prior to Admission Living  Arrangement: Home Anticipated Discharge Location: Home Barriers to Discharge: Continued medical management Dispo: Anticipated discharge in approximately  2-3 day(s).   Honor Junes, MD 05/31/2020, 11:22 AM Pager: [phone] After 5pm on weekdays and 1pm on weekends: On Call pager [phone]

## 2020-05-31 NOTE — TOC Transition Note (Signed)
Transition of Care [facility]) - CM/SW Discharge Note   Patient Details  Name: Brandon [name] MRN: [id number] Date of Birth: 1945-09-12  [facility]) CM/SW Contact:  Zenon Mayo, RN Phone Number: 05/31/2020, 3:03 PM   Clinical Narrative:    Per Physical therapist , she is rec HHPT, NCM offered choice to patient, he states he does not have a preference.  NCM made referral to Amy with [facility], she states she can take referral for [place], [place], [place].  Soc will begin 24 to 48 hrs post discharge.     Final next level of care: [place] Barriers to Discharge: Continued Medical Work up   Patient Goals and CMS Choice Patient states their goals for this hospitalization and ongoing recovery are:: to get better CMS Medicare.gov Compare Post Acute Care list provided to:: Patient Choice offered to / list presented to : Patient  Discharge Placement                       Discharge Plan and Services                  DME Agency: NA       HH Arranged: RN, Disease Management, PT, OT HH Agency: [facility] Date [facility] Agency Contacted: 05/31/20 Time HH Agency Contacted: 77 Representative spoke with at [place]: Amy  Social Determinants of Health ([place]) Interventions     Readmission Risk Interventions Readmission Risk Prevention Plan 10/06/2019 10/06/2019  Transportation Screening - Complete  PCP or Specialist Appt within 3-5 Days Complete -  HRI or [place] - Complete  Social Work Consult for [place] Planning/Counseling - Complete  Palliative Care Screening - Not Applicable  Medication Review [employer]) - Complete

## 2020-05-31 NOTE — Plan of Care (Signed)

## 2020-05-31 NOTE — Progress Notes (Signed)
Occupational Therapy Treatment Patient Details Name: Brandon [name] MRN: [id number] DOB: [birth year] Today's Date: 05/31/2020    History of present illness Pt is a 74 y/o male admitted secondary to CHF exacerbation and DRESS syndrome. PMH includes DM, a fib, alcoholic cirrhosis and colon cancer.    OT comments  Patient with continued complaints of rash, itching, painful knees, hips, shoulders and palms of hand, poor stand balance, decreased activity tolerance and generalized weakness; all combine to limit his ADL and functional mobility compared to his stated prior level of function.  He continues to want to transition home with [facility] if needed.  OT will continue to follow in the acute setting to maximize functional status.    Follow Up Recommendations  Home health OT    Equipment Recommendations  None recommended by OT    Recommendations for Other Services      Precautions / Restrictions Precautions Precautions: Fall Restrictions Weight Bearing Restrictions: No       Mobility Bed Mobility Overal bed mobility: Needs Assistance Bed Mobility: Sit to Supine     Supine to sit: Supervision;HOB elevated Sit to supine: Supervision      Transfers Overall transfer level: Needs assistance Equipment used: Rolling walker (2 wheeled) Transfers: Sit to/from Stand Sit to Stand: Min guard Stand pivot transfers: Min guard       General transfer comment: min guard for safety.  Patient has to gain momentum to sit or stand.    Balance   Sitting-balance support: No upper extremity supported;Feet supported Sitting balance-Leahy Scale: Good     Standing balance support: Bilateral upper extremity supported;During functional activity Standing balance-Leahy Scale: Fair                             ADL either performed or assessed with clinical judgement   ADL Overall ADL's : Needs assistance/impaired     Grooming: Wash/dry hands;Wash/dry face;Min guard;Standing            Upper Body Dressing : Set up;Sitting   Lower Body Dressing: Minimal assistance;Sit to/from stand               Functional mobility during ADLs: Min guard       Vision       Perception     Praxis      Cognition Arousal/Alertness: Awake/alert Behavior During Therapy: WFL for tasks assessed/performed Overall Cognitive Status: Within Functional Limits for tasks assessed                                           Prior Functioning/Environment              Frequency  Min 2X/week        Progress Toward Goals  OT Goals(current goals can now be found in the care plan section)  Progress towards OT goals: Progressing toward goals  Acute Rehab OT Goals Patient Stated Goal: Hopefuly go hom and get back to my self OT Goal Formulation: With patient Time For Goal Achievement: 06/13/20 Potential to Achieve Goals: Good  Plan Discharge plan remains appropriate    Co-evaluation                 AM-PAC OT "6 Clicks" Daily Activity     Outcome Measure   Help from another person eating meals?: None Help from another person taking  care of personal grooming?: A Little Help from another person toileting, which includes using toliet, bedpan, or urinal?: A Little Help from another person bathing (including washing, rinsing, drying)?: A Little Help from another person to put on and taking off regular upper body clothing?: A Little Help from another person to put on and taking off regular lower body clothing?: A Little 6 Click Score: 19    End of Session Equipment Utilized During Treatment: Rolling walker  OT Visit Diagnosis: Unsteadiness on feet (R26.81)   Activity Tolerance Patient tolerated treatment well   Patient Left with call bell/phone within reach;in chair;with nursing/sitter in room   Nurse Communication          Time: 4585-9292 OT Time Calculation (min): 23 min  Charges: OT General Charges $OT Visit: 1 Visit OT  Treatments $Self Care/Home Management : 8-22 mins  05/31/2020  Rich, OTR/L  Acute Rehabilitation Services  Office:  [place] 05/31/2020, 10:13 AM

## 2020-05-31 NOTE — Progress Notes (Signed)
   05/31/20 0450  Assess: MEWS Score  Temp (!) 94.3 F (34.6 C)  BP 129/71  Pulse Rate 69  Resp 18  Level of Consciousness Alert  SpO2 97 %  O2 Device Room Air  Assess: MEWS Score  MEWS Temp 2  MEWS Systolic 0  MEWS Pulse 0  MEWS RR 0  MEWS LOC 0  MEWS Score 2  MEWS Score Color Yellow  Assess: if the MEWS score is Yellow or Red  Were vital signs taken at a resting state? Yes  Focused Assessment No change from prior assessment  Early Detection of Sepsis Score *See Row Information* Medium  MEWS guidelines implemented *See Row Information* No, previously yellow, continue vital signs every 4 hours  Treat  MEWS Interventions Escalated (See documentation below)  Pain Scale 0-10  Pain Score 0  Patients response to intervention Unchanged (pt's temperature consistently low this admission.)  Take Vital Signs  Increase Vital Sign Frequency  Yellow: Q 2hr X 2 then Q 4hr X 2, if remains yellow, continue Q 4hrs (q4 vital signs continues)  Escalate  MEWS: Escalate Yellow: discuss with charge nurse/RN and consider discussing with provider and RRT  Notify: Charge Nurse/RN  Name of Charge Nurse/RN Notified [place] RN  Date Charge Nurse/RN Notified 05/31/20  Time Charge Nurse/RN Notified 0500  Document  Patient Outcome Other (Comment) (pt a&ox4. no pain. remains stable. )  Progress note created (see row info) Yes  Patient has had difficulty with thermoregulation d/t diffuse rash per physician's notes. Attending aware of the low temperatures.

## 2020-06-01 ENCOUNTER — Other Ambulatory Visit: Payer: Self-pay

## 2020-06-01 LAB — BASIC METABOLIC PANEL
Anion gap: 7 (ref 5–15)
BUN: 39 mg/dL — ABNORMAL HIGH (ref 8–23)
CO2: 26 mmol/L (ref 22–32)
Calcium: 9.5 mg/dL (ref 8.9–10.3)
Chloride: 99 mmol/L (ref 98–111)
Creatinine, Ser: 1.12 mg/dL (ref 0.61–1.24)
GFR, Estimated: >60 mL/min (ref >60)
Glucose, Bld: 128 mg/dL — ABNORMAL HIGH (ref 70–99)
Potassium: 5 mmol/L (ref 3.5–5.1)
Sodium: 132 mmol/L — ABNORMAL LOW (ref 135–145)

## 2020-06-01 LAB — CBC
HCT: 39.5 % (ref 39.0–52.0)
Hemoglobin: 12.3 g/dL — ABNORMAL LOW (ref 13.0–17.0)
MCH: 28 pg (ref 26.0–34.0)
MCHC: 31.1 g/dL (ref 30.0–36.0)
MCV: 90 fL (ref 80.0–100.0)
Platelets: 153 10*3/uL (ref 150–400)
RBC: 4.39 MIL/uL (ref 4.22–5.81)
RDW: 15.8 % — ABNORMAL HIGH (ref 11.5–15.5)
WBC: 11.7 10*3/uL — ABNORMAL HIGH (ref 4.0–10.5)
nRBC: 0 % (ref 0.0–0.2)

## 2020-06-01 LAB — GLUCOSE, CAPILLARY
Glucose-Capillary: 116 mg/dL — ABNORMAL HIGH (ref 70–99)
Glucose-Capillary: 142 mg/dL — ABNORMAL HIGH (ref 70–99)
Glucose-Capillary: 259 mg/dL — ABNORMAL HIGH (ref 70–99)
Glucose-Capillary: 121 mg/dL — ABNORMAL HIGH (ref 70–99)

## 2020-06-01 NOTE — Progress Notes (Signed)
Subjective: HD#6 Overnight, noted to be hypothermic and needed hydroxyzine 50 mg at last evening and this morning. Patient evaluated at bedside this AM. Not feeling bad, still itching, says it has improved some but not a lot. Breathing fine, continues to have chronic cough 2/2 sinus issues. Continues to have good appetite. Has been able to get up and move around and sit in chair. Still having chills, sweats. States he "feels cold and sweaty at the same time." Discussed trying warm blankets today. Patient mentions he does not feel ready for home as he can get better treatment here in the hospital than at home. We discussed he is on multiple medications that could cause bullous pemphigoid. Will find dermatologist for patient to follow-up with.  Objective:  Vital signs in last 24 hours: Vitals:   05/31/20 2035 06/01/20 0444 06/01/20 0530 06/01/20 1126  BP: 119/69 (!) 155/90  129/73  Pulse: (!) 57 64  61  Resp: 20 19  20   Temp: (!) 94 F (34.4 C) (!) 94.9 F (34.9 C)  (!) 94.6 F (34.8 C)  TempSrc: Axillary Axillary  Oral  SpO2: 97% 96%  97%  Weight:   (!) 300 lb 0.7 oz (136.1 kg)   Height:       Physical Exam Constitutional:  Appearance: He is obese. He is notill-appearing.  Comments:Elderly male, lying in bed, NAD HENT: Normocephalic and atraumatic, EOMI, moist mucous membranes Respiratory: No respiratory distress, no accessory muscle use. Effort is normal.  Musculoskeletal: Normal bulk and tone. 1+ pitting edemabilaterally. Neurological: Is alert and oriented x4, no apparent focal deficits noted. Skin: Warm and dry.Diffuseerrythematous macular papular rashacross all extremities and the abdomen, chest, back, and headwith lots of erosions from scratching.No vesicles. Small silver scale on the arms.  Assessment/Plan:  Principal Problem:   Bullous pemphigoid Active Problems:   DM (diabetes mellitus), type 2 (HCC)   Atrial fibrillation (HCC)   Alcoholic cirrhosis  of liver with ascites (HCC)   Acute on chronic heart failure (HCC)   Hypothermia  Bullous Pemphigoid:Dermatopathology results of biopsy from his left thigh consistent with bullous pemphigoid. He has continued complaints of rash, itching which limits his ADL's and his functional mobility. Working with PT and they suggests Home health PT for him and that he is progressing towards his goals. - Continue Prednisone 60 mg - Continue Clobetasol cream BID to thighs and abdomen. -Continue with supportive care including topical emollientsand hydroxyzine for the itching.  Hypothermia: He has persistent hypothermia 94.9 likely due to the diffuse rash impairing his thermoregulation. He cannot tolerates the warming blanket because of the rash and realizes it would make itching worse. Clinically he appears comfortable and stable.  - Blood culture negative for growth -Warm blankets  Acute on chronic HFpEFexacerbation: Patient's lasix were heldand chronic heart failure with preserved ejection fraction seems well compensated today.Todayhe hasno difficulty breathing.Echo with LVEF 60-65%, no RWMA, mild aortic regurgitation that is stable from prior exam.Clinically appears close to euvolemic today.   - Strict I&O - BMP daily   Atrial fibrillation, rate controlled CHADSVASC2 score 4; HASBLED score 4 with history of GI bleed requiring blood transfusion when on anticoagulation with DAPT in the past. Is currently rate controlled with beta blocker - Continue carvedilol 6.25mg  bid - Continue cardiac monitoring   Diabetes Mellitus: HbA1c 6.9. He is on metformin at home. Patient started on SGLT-2i yesterday to decrease frequency of CHF exacerbations in diabetes.  -Since his scr has crept up to 1.65 so the eGFR<45,weare  going totemporarily hold his Jardiance. His CBG today is 142. - Continue CBG monitoring  - Continue SSI Prior to Admission Living Arrangement: Home Anticipated Discharge  Location: Home Barriers to Discharge: Continued medical management Dispo: Anticipated discharge in approximately 2-3  day(s).   Honor Junes, MD 06/01/2020, 1:11 PM Pager: [phone] After 5pm on weekdays and 1pm on weekends: On Call pager [phone]

## 2020-06-01 NOTE — Progress Notes (Signed)
Bear hugger applied to pt as ordered

## 2020-06-01 NOTE — Plan of Care (Signed)

## 2020-06-01 NOTE — Progress Notes (Signed)
Pt is known to be hypothermic due to skin rash. MDs and charge nurse aware. Reiterated with nursing team. Pt shows no signs of distress.

## 2020-06-02 LAB — GLUCOSE, CAPILLARY
Glucose-Capillary: 108 mg/dL — ABNORMAL HIGH (ref 70–99)
Glucose-Capillary: 128 mg/dL — ABNORMAL HIGH (ref 70–99)

## 2020-06-02 LAB — BASIC METABOLIC PANEL
Anion gap: 8 (ref 5–15)
BUN: 40 mg/dL — ABNORMAL HIGH (ref 8–23)
CO2: 26 mmol/L (ref 22–32)
Calcium: 9.1 mg/dL (ref 8.9–10.3)
Chloride: 97 mmol/L — ABNORMAL LOW (ref 98–111)
Creatinine, Ser: 1.12 mg/dL (ref 0.61–1.24)
GFR, Estimated: >60 mL/min (ref >60)
Glucose, Bld: 108 mg/dL — ABNORMAL HIGH (ref 70–99)
Potassium: 5.3 mmol/L — ABNORMAL HIGH (ref 3.5–5.1)
Sodium: 131 mmol/L — ABNORMAL LOW (ref 135–145)

## 2020-06-02 LAB — CBC
HCT: 38.3 % — ABNORMAL LOW (ref 39.0–52.0)
Hemoglobin: 12 g/dL — ABNORMAL LOW (ref 13.0–17.0)
MCH: 28.6 pg (ref 26.0–34.0)
MCHC: 31.3 g/dL (ref 30.0–36.0)
MCV: 91.4 fL (ref 80.0–100.0)
Platelets: 161 10*3/uL (ref 150–400)
RBC: 4.19 MIL/uL — ABNORMAL LOW (ref 4.22–5.81)
RDW: 15.9 % — ABNORMAL HIGH (ref 11.5–15.5)
WBC: 13.2 10*3/uL — ABNORMAL HIGH (ref 4.0–10.5)
nRBC: 0 % (ref 0.0–0.2)

## 2020-06-02 MED ORDER — FUROSEMIDE 40 MG PO TABS
40.0000 mg | ORAL_TABLET | Freq: Every day | ORAL | Status: DC
  Administered 2020-06-02: 40 mg via ORAL
  Filled 2020-06-02: qty 1

## 2020-06-02 MED ORDER — PREDNISONE 20 MG PO TABS
40.0000 mg | ORAL_TABLET | Freq: Every day | ORAL | Status: DC
  Administered 2020-06-02 – 2020-06-04 (×3): 40 mg via ORAL
  Filled 2020-06-02 (×4): qty 2

## 2020-06-02 NOTE — Progress Notes (Signed)
   06/02/20 1139  Assess: MEWS Score  Temp 97.7 F (36.5 C)  BP 109/60  Pulse Rate 70  Resp 18  SpO2 95 %  Assess: MEWS Score  MEWS Temp 0  MEWS Systolic 0  MEWS Pulse 0  MEWS RR 0  MEWS LOC 0  MEWS Score 0  MEWS Score Color Green  Treat  Pain Scale 0-10  Pain Score 0

## 2020-06-02 NOTE — Plan of Care (Signed)

## 2020-06-02 NOTE — Progress Notes (Signed)
Subjective: HD 8 Overnight, no acute events reported  Patient evaluated at bedside this AM. Says he continues to itch but continues to improve. Mentions he has "hot spots" on his chest and upper arms that are worse than other areas. He was unable to do physical therapy yesterday due to body temp. Mentions his left hand is sore. Discussed working with PT today. States sitting up in bed for him right now is very difficult, discussed different options for utilization of bathroom. Endorses worry over going home with low body temp.  Objective:  Vital signs in last 24 hours: Vitals:   06/01/20 1126 06/01/20 2059 06/01/20 2100 06/02/20 0538  BP: 129/73 (!) 144/80  (!) 128/59  Pulse: 61 75  70  Resp: 20 18  18   Temp: (!) 94.6 F (34.8 C) (!) 97 F (36.1 C) (!) 97.5 F (36.4 C) (!) 97.4 F (36.3 C)  TempSrc: Oral Oral Axillary Oral  SpO2: 97% 97%  97%  Weight:    (!) 137.5 kg  Height:       Physical Exam  Constitutional: obese elderly male. No distress. Bear hugger in place HENT: Normocephalic and atraumatic, EOMI, conjunctiva normal, moist mucous membranes Cardiovascular: Normal rate, regular rhythm, S1 and S2 present, no murmurs, rubs, gallops.  Distal pulses intact Respiratory: No respiratory distress, no accessory muscle use.  Effort is normal.  Lungs are clear to auscultation bilaterally on anterior auscultation.  Musculoskeletal: Normal bulk and tone.  Mild bilateral lower extremity edema  Neurological: Is alert and oriented x4, no apparent focal deficits noted. Skin: Warm and dry.  Diffuse erythematous rash across all extremities and abdomen. Multiple scabs noted; No active vesicles or bullae.  Psychiatric: Normal mood and affect. Behavior is normal. Judgment and thought content normal.    Assessment/Plan:  Principal Problem:   Bullous pemphigoid Active Problems:   DM (diabetes mellitus), type 2 (HCC)   Atrial fibrillation (HCC)   Alcoholic cirrhosis of liver with ascites  (HCC)   Acute on chronic heart failure ([place])   Hypothermia  Brandon [name] is a 74 year old male with PMHx on atrial fibrillation not on anticoagulation, alcoholic cirrhosis with ascites and esophageal varices, BPH, and type 3 DM admitted for acute on chronic HFpEF and bullous pemphigoid.   Hypothermia:  Persistent hypothermia with lowest temp of 94.6 over the past 24 hours. Blood cultures have been negative to date. Suspect that his hypothermia is in setting of the diffuse rash from bullous pemphigoid disrupting the skin barrier and impairing his thermoregulation. He is being peripherally warmed during examination with warming blanket. - Continue to f/u blood cultures - Warming blankets   Bullous Pemphigoid:  Patient presented with diffuse bullous rash; dermatopathology confirmed bullous pemphigoid for which he has been on high dose steroids. His diffuse rash is improving but does continue to have some itching. He has had five days of prednisone 60mg  daily. At this time, will deescalate steroids . - Prednisone 40mg  daily for 5 days - Atarax 50mg  tid prn - Continue to encourage PT/OT for assisted ambulation   Acute on chronic HFpEF exacerbation Patient presented with acute on chronic HFpEF exacerbation. He has been diuresed this admission with ~10L UOP and remains net -3.9L. Over past several days, holding diuresis in setting of euvolemia. Today, has mild pitting edema of bilateral lower extremities. Did have ~1.4kg increase in weight. Renal function is stable.  - Resume home Lasix 40mg  daily  - Strict I&O - BMP monitoring  Atrial fibrillation,  rate controlled:    CHADS-VASc2 score 4, HASBLED score 4 with history of prior GI bleed requiring blood transfusion. Currently rate controlled with coreg. - Continue coreg 6.25mg  bid - Continue cardiac monitoring  Diabetes mellitus:  Currently well controlled on SSI. SGLT-2i was initiated during this admission initially to attempt to minimize  readmission for heart failure exacerbations. However, this was discontinued in setting of increasing serum creatinine.  - Continue CBG monitoring - Continue SSI - Will consider resuming Jardiance on discharge   Diet: HH/CM Fluids/Electrolytes: Monitor and replete prn DVT Prophylaxis: Lovenox Code status: DNR  Prior to Admission Living Arrangement: Home Anticipated Discharge Location: Home w/HH services Barriers to Discharge: Continued medical management  Dispo: Anticipated discharge in approximately 3-4 day(s).   Brandon Heck, MD  [facility] PGY-2 06/02/2020, 6:36 AM Pager: [phone] After 5pm on weekdays and 1pm on weekends: On Call pager [phone]

## 2020-06-03 LAB — BASIC METABOLIC PANEL
Anion gap: 10 (ref 5–15)
BUN: 50 mg/dL — ABNORMAL HIGH (ref 8–23)
CO2: 26 mmol/L (ref 22–32)
Calcium: 9.3 mg/dL (ref 8.9–10.3)
Chloride: 98 mmol/L (ref 98–111)
Creatinine, Ser: 1.73 mg/dL — ABNORMAL HIGH (ref 0.61–1.24)
GFR, Estimated: 41 mL/min — ABNORMAL LOW (ref >60)
Glucose, Bld: 140 mg/dL — ABNORMAL HIGH (ref 70–99)
Potassium: 5.2 mmol/L — ABNORMAL HIGH (ref 3.5–5.1)
Sodium: 134 mmol/L — ABNORMAL LOW (ref 135–145)

## 2020-06-03 LAB — GLUCOSE, CAPILLARY
Glucose-Capillary: 153 mg/dL — ABNORMAL HIGH (ref 70–99)
Glucose-Capillary: 132 mg/dL — ABNORMAL HIGH (ref 70–99)
Glucose-Capillary: 182 mg/dL — ABNORMAL HIGH (ref 70–99)
Glucose-Capillary: 164 mg/dL — ABNORMAL HIGH (ref 70–99)
Glucose-Capillary: 198 mg/dL — ABNORMAL HIGH (ref 70–99)

## 2020-06-03 LAB — CBC
HCT: 39.3 % (ref 39.0–52.0)
Hemoglobin: 12.5 g/dL — ABNORMAL LOW (ref 13.0–17.0)
MCH: 28.6 pg (ref 26.0–34.0)
MCHC: 31.8 g/dL (ref 30.0–36.0)
MCV: 89.9 fL (ref 80.0–100.0)
Platelets: 167 10*3/uL (ref 150–400)
RBC: 4.37 MIL/uL (ref 4.22–5.81)
RDW: 16 % — ABNORMAL HIGH (ref 11.5–15.5)
WBC: 12.7 10*3/uL — ABNORMAL HIGH (ref 4.0–10.5)
nRBC: 0.2 % (ref 0.0–0.2)

## 2020-06-03 MED ORDER — SODIUM ZIRCONIUM CYCLOSILICATE 5 G PO PACK
5.0000 g | PACK | Freq: Once | ORAL | Status: AC
  Administered 2020-06-03: 5 g via ORAL
  Filled 2020-06-03 (×2): qty 1

## 2020-06-03 NOTE — Progress Notes (Signed)
Subjective: HD 9 Overnight, no acute events reported  Patient evaluated at bedside this AM. Patient seen sitting up in chair. Feeling okay this morning. States last night he was dizzy and confused but feels better today. Mentions he has been urinating well. Rash has continued to improve. Was not able to work with PT yesterday, hopes he will be able to today.  Objective:  Vital signs in last 24 hours: Vitals:   06/02/20 2300 06/03/20 0000 06/03/20 0400 06/03/20 0414  BP:    131/81  Pulse:    89  Resp: 15 17 16 20   Temp:    (!) 97.4 F (36.3 C)  TempSrc:    Oral  SpO2:    93%  Weight:   135.2 kg   Height:       Physical Exam  Constitutional: obese elderly male. No distress. Sitting up in bedside chair HENT: Normocephalic and atraumatic, EOMI, conjunctiva normal, moist mucous membranes Cardiovascular: Normal rate, regular rhythm, S1 and S2 present, no murmurs, rubs, gallops.  Distal pulses intact Respiratory: No respiratory distress, no accessory muscle use.  Effort is normal.  Lungs are clear to auscultation bilaterally on anterior auscultation.  Musculoskeletal: Normal bulk and tone.  No peripheral edema noted.  Neurological: Is alert and oriented x4, no apparent focal deficits noted. Skin: Warm and dry.  Diffuse erythematous rash across all extremities and abdomen. Multiple scabs noted; No active vesicles or bullae.  Psychiatric: Normal mood and affect. Behavior is normal. Judgment and thought content normal.   Assessment/Plan:  Principal Problem:   Bullous pemphigoid Active Problems:   DM (diabetes mellitus), type 2 (HCC)   Atrial fibrillation (HCC)   Alcoholic cirrhosis of liver with ascites (HCC)   Acute on chronic heart failure ([place])   Hypothermia  Mr Brandon [name] is a 74 year old male with PMHx on atrial fibrillation not on anticoagulation, alcoholic cirrhosis with ascites and esophageal varices, BPH, and type 3 DM admitted for acute on chronic HFpEF and bullous  pemphigoid.   Hypothermia:  Patient's hypothermia has improved. His lowest temp was 97.4 over the past 24 hours. His blood cultures have been negative to date. Suspect that his hypothermia was secondary to impaired thermoregulation due to disruption of the skin barrier from the bullous pemphigoid rash. He is not currently requiring bear hugger.  - Warming blanket prn - F/u blood cultures   Bullous Pemphigoid:  Patient presented with diffuse bullous rash; dermatopathology confirmed bullous pemphigoid for which he has been on high dose steroids. His diffuse rash is improving but does continue to have some itching. He has been started on steroid taper.  - Prednisone 40mg  daily, day 2/5 - Atarax 50mg  tid prn - Continue to encourage PT/OT for assisted ambulation   Acute kidney injury:  Patient noted to have increase in sCr to 1.73 from 1.12 this morning in setting of resuming his home lasix dosing. He has had good urine output. However, suspect that this dosing may be too high for him. At this time, will hold off on lasix for today.  - F/u BMP in AM - Strict I&O - Avoid nephrotoxic agents   HFpEF:  Patient presented with acute on chronic HFpEF exacerbation. He has been diuresed this admission with ~10L UOP and remains net -2.2L. He was resumed on his home lasix dosing yesterday. This morning, appears euvolemic on examination. However, did develop an AKI from the diuresis and patient also endorsing feeling dizzy; suspect may be secondary to overdiuresis. Will hold  his diuretic today. Suspect he may have to lower his lasix dosing on discharge.  - Continue to monitor volume status - Holding diuresis at this time  - Strict I&O - BMP monitoring  Atrial fibrillation, rate controlled:    CHADS-VASc2 score 4, HASBLED score 4 with history of prior GI bleed requiring blood transfusion. Currently rate controlled with coreg. - Continue coreg 6.25mg  bid - Continue cardiac monitoring  Diabetes mellitus:   Currently well controlled on SSI. SGLT-2i was initiated during this admission initially to attempt to minimize readmission for heart failure exacerbations. However, this was discontinued in setting of increasing serum creatinine.  - Continue CBG monitoring - Continue SSI - Will consider resuming Jardiance on discharge   Diet: HH/CM Fluids/Electrolytes: Monitor and replete prn DVT Prophylaxis: Lovenox Code status: DNR   Prior to Admission Living Arrangement: Home Anticipated Discharge Location: Home w/HH services Barriers to Discharge: Continued medical management  Dispo: Anticipated discharge in approximately 1-2 day(s).   [name], MD  [facility] PGY-2 06/03/2020, 6:21 AM Pager: [phone] After 5pm on weekdays and 1pm on weekends: On Call pager [phone]

## 2020-06-03 NOTE — Progress Notes (Signed)
Occupational Therapy Treatment Patient Details Name: Brandon [name] MRN: [id number] DOB: March 14, 1946 Today's Date: 06/03/2020    History of present illness Pt is a 74 y/o male admitted secondary to CHF exacerbation and DRESS syndrome. PMH includes DM, a fib, alcoholic cirrhosis and colon cancer.    OT comments  OT treatment session with focus on [facility]. Per RN report, patient dropping items during self-feeding 2/2 neuropathy and peeling rash on bilateral hands. [facility] HEP provided with education on frequency/duration. Patient able to return demonstrate 10 exercises with use of written handout. OT also provided squeeze ball and built up foam for use over utensils. Patient declined snack for return demonstration at this time. Patient seems hesitant about effectiveness of exercises and use of adaptive equipment stating "this stuff won't make it better." Education on slowing progression of deficits with HEP and compensating for deficits with use of AE. Patient expressed verbal understanding. Recommendation to transfer liquids during mealtime into wider foam cups or mugs with handles. Nursing staff in agreement. Patient would benefit from continued acute OT services in prep for safe d/c to next level of care with continued recommendation for [place].    Follow Up Recommendations  Home health OT;Supervision - Intermittent    Equipment Recommendations  None recommended by OT    Recommendations for Other Services      Precautions / Restrictions Precautions Precautions: Fall Restrictions Weight Bearing Restrictions: No       Mobility Bed Mobility Overal bed mobility: Needs Assistance             General bed mobility comments: Seated in recliner upon entry   Transfers                      Balance     Sitting balance-Leahy Scale: Good                                     ADL either performed or assessed with clinical judgement   ADL                                                Vision       Perception     Praxis      Cognition Arousal/Alertness: Awake/alert Behavior During Therapy: WFL for tasks assessed/performed Overall Cognitive Status: Within Functional Limits for tasks assessed                                          Exercises Exercises: Hand exercises ([place] HEP)   Shoulder Instructions       General Comments Peeling rash to bilateral hands    Pertinent Vitals/ Pain       Pain Assessment: Faces Faces Pain Scale: Hurts a little bit Pain Location: general Pain Descriptors / Indicators: Aching Pain Intervention(s): Limited activity within patient's tolerance;Monitored during session;Repositioned  Home Living                                          Prior Functioning/Environment  Frequency  Min 2X/week        Progress Toward Goals  OT Goals(current goals can now be found in the care plan section)  Progress towards OT goals: Progressing toward goals  Acute Rehab OT Goals Patient Stated Goal: Hopefuly go home and get back to my self OT Goal Formulation: With patient Time For Goal Achievement: 06/13/20 Potential to Achieve Goals: Good ADL Goals Pt Will Perform Grooming: with modified independence;standing Pt Will Perform Upper Body Bathing: with modified independence;sitting Pt Will Perform Lower Body Bathing: with modified independence;sit to/from stand Pt Will Perform Upper Body Dressing: with modified independence;sitting Pt Will Perform Lower Body Dressing: with modified independence;sit to/from stand Pt Will Transfer to Toilet: with modified independence;ambulating  Plan Discharge plan remains appropriate    Co-evaluation                 AM-PAC OT "6 Clicks" Daily Activity     Outcome Measure   Help from another person eating meals?: None Help from another person taking care of personal grooming?: A Little Help from another  person toileting, which includes using toliet, bedpan, or urinal?: A Little Help from another person bathing (including washing, rinsing, drying)?: A Little Help from another person to put on and taking off regular upper body clothing?: A Little Help from another person to put on and taking off regular lower body clothing?: A Little 6 Click Score: 19    End of Session    OT Visit Diagnosis: Unsteadiness on feet (R26.81)   Activity Tolerance     Patient Left     Nurse Communication          Time: 8338-2505 OT Time Calculation (min): 13 min  Charges: OT General Charges $OT Visit: 1 Visit OT Treatments $Therapeutic Activity: 8-22 mins  Kaiah Hosea H. OTR/L Supplemental OT, Department of rehab services [phone]   Vannie Hilgert R H. 06/03/2020, 10:29 AM

## 2020-06-04 ENCOUNTER — Other Ambulatory Visit (HOSPITAL_COMMUNITY): Payer: Self-pay | Admitting: Student in an Organized Health Care Education/Training Program

## 2020-06-04 LAB — CBC
HCT: 37.9 % — ABNORMAL LOW (ref 39.0–52.0)
Hemoglobin: 12 g/dL — ABNORMAL LOW (ref 13.0–17.0)
MCH: 28.4 pg (ref 26.0–34.0)
MCHC: 31.7 g/dL (ref 30.0–36.0)
MCV: 89.6 fL (ref 80.0–100.0)
Platelets: 137 10*3/uL — ABNORMAL LOW (ref 150–400)
RBC: 4.23 MIL/uL (ref 4.22–5.81)
RDW: 15.9 % — ABNORMAL HIGH (ref 11.5–15.5)
WBC: 18.7 10*3/uL — ABNORMAL HIGH (ref 4.0–10.5)
nRBC: 0 % (ref 0.0–0.2)

## 2020-06-04 LAB — GLUCOSE, CAPILLARY
Glucose-Capillary: 284 mg/dL — ABNORMAL HIGH (ref 70–99)
Glucose-Capillary: 132 mg/dL — ABNORMAL HIGH (ref 70–99)
Glucose-Capillary: 219 mg/dL — ABNORMAL HIGH (ref 70–99)

## 2020-06-04 LAB — BASIC METABOLIC PANEL
Anion gap: 6 (ref 5–15)
BUN: 40 mg/dL — ABNORMAL HIGH (ref 8–23)
CO2: 24 mmol/L (ref 22–32)
Calcium: 9.1 mg/dL (ref 8.9–10.3)
Chloride: 98 mmol/L (ref 98–111)
Creatinine, Ser: 1.14 mg/dL (ref 0.61–1.24)
GFR, Estimated: >60 mL/min (ref >60)
Glucose, Bld: 131 mg/dL — ABNORMAL HIGH (ref 70–99)
Potassium: 5.2 mmol/L — ABNORMAL HIGH (ref 3.5–5.1)
Sodium: 128 mmol/L — ABNORMAL LOW (ref 135–145)

## 2020-06-04 MED ORDER — PREDNISONE 20 MG PO TABS: ORAL_TABLET | ORAL | 0 refills | Status: DC

## 2020-06-04 MED ORDER — SODIUM ZIRCONIUM CYCLOSILICATE 5 G PO PACK
5.0000 g | PACK | Freq: Once | ORAL | Status: DC
  Filled 2020-06-04: qty 1

## 2020-06-04 MED ORDER — CLOBETASOL PROPIONATE 0.05 % EX CREA
TOPICAL_CREAM | Freq: Two times a day (BID) | CUTANEOUS | 0 refills | Status: DC
Start: 2020-06-04 — End: 2020-06-08

## 2020-06-04 MED ORDER — HYDROXYZINE HCL 25 MG PO TABS
25.0000 mg | ORAL_TABLET | Freq: Every day | ORAL | 0 refills | Status: DC
Start: 2020-06-04 — End: 2021-03-19

## 2020-06-04 MED ORDER — FINASTERIDE 5 MG PO TABS: 5.0000 mg | ORAL_TABLET | Freq: Every day | ORAL | 0 refills | Status: DC

## 2020-06-04 MED ORDER — PREDNISONE 20 MG PO TABS: 20.0000 mg | ORAL_TABLET | Freq: Every day | ORAL | Status: DC

## 2020-06-04 MED FILL — predniSONE 20 MG TABS: 20 | 5 days supply | Qty: 4 | Fill #0

## 2020-06-04 MED FILL — CLOBETASOL 0.05% CREAM: 0.05 | 7 days supply | Qty: 30 | Fill #0

## 2020-06-04 NOTE — Care Management Important Message (Signed)
Important Message  Patient Details  Name: Brandon [name] MRN: [id number] Date of Birth: March 22, 1946   Medicare Important Message Given:  Yes     Shelda Altes 06/04/2020, 9:47 AM

## 2020-06-04 NOTE — Progress Notes (Signed)
D/C instructions given and reviewed. No questions asked but encouraged to call with any further concerns. Pt awaiting son to get off work this afternoon for transport home. Primary RN informed.

## 2020-06-04 NOTE — Progress Notes (Signed)
Subjective: HD 10 Overnight, no acute events reported Feeling better today. Denies chest pain, dyspnea. Itching is improved.  He is informed that we are working on dermatology referral.  He states he feels ready to go home today  He is explained about Orders: prednisone taper on discharge  clobetazol topical bid - hold lasix for 2 weeks  - has home health RN  He plans to see his Cardiology: Dr Elonda Husky and PCP: Tobie Lords, NP after discharge.   Objective:  Vital signs in last 24 hours: Vitals:   06/03/20 1600 06/03/20 2000 06/04/20 0000 06/04/20 0400  BP:  140/76 110/65 122/69  Pulse:  77 (!) 59 79  Resp: 13   17  Temp:  97.8 F (36.6 C) 97.6 F (36.4 C) 97.7 F (36.5 C)  TempSrc:  Oral Oral Oral  SpO2:  99% 98% 97%  Weight:    295 lb 9.6 oz (134.1 kg)  Height:       Physical Exam  Constitutional: obese elderly male. No distress. Sitting up in bedside chair HENT: Normocephalic and atraumatic, EOMI, conjunctiva normal, moist mucous membranes Musculoskeletal: Normal bulk and tone.  No peripheral edema noted.  Neurological: Is alert and oriented x4, no apparent focal deficits noted. Skin: Warm and dry.  Diffuse erythematous rash across all extremities and abdomen. Multiple scabs noted; No active vesicles or bullae.  Psychiatric: Normal mood and affect. Behavior is normal. Judgment and thought content normal.   Assessment/Plan:  Brandon [name] is a 74 year old male with PMHx on atrial fibrillation not on anticoagulation, alcoholic cirrhosis with ascites and esophageal varices, BPH, and type 3 DM admitted for acute on chronic HFpEF and bullous pemphigoid.   Principal Problem:   Bullous pemphigoid Active Problems:   DM (diabetes mellitus), type 2 (HCC)   Atrial fibrillation (HCC)   Alcoholic cirrhosis of liver with ascites (HCC)   Acute on chronic heart failure (HCC)   Hypothermia    Bullous Pemphigoid:  Patient presented with diffuse bullous rash;  dermatopathology confirmed bullous pemphigoid for which he has been on high dose steroids. His diffuse rash is improving but does continue to have some itching. He has been started on steroid taper.  - Prednisone 40mg  daily, day 3/5- plan is 20 mg for next 3 days and then 10 mg for 2 days.  - Atarax 50mg  tid prn - Continue to encourage PT/OT for assisted ambulation   Acute kidney injury:  Patient  sCr is trending down to 1.14 this morning in setting of holding his home lasix dosing. He has had good urine output. However, suspect that his home dosing may be too high for him, he is advised to see his Cardiology: Dr Elonda Husky before restarting it.   - hold off on lasix - Strict I&O - Avoid nephrotoxic agents   Hypothermia:  Patient's hypothermia has improved. His lowest temp was 97.7 over the past 24 hours. His blood cultures have been negative to date. Suspect that his hypothermia was secondary to impaired thermoregulation due to disruption of the skin barrier from the bullous pemphigoid rash. He is not currently requiring bear hugger.  - Warming blanket prn   HFpEF:  Patient presented with acute on chronic HFpEF exacerbation. He has been diuresed this admission with ~10L UOP and remains net -2.2L. He was resumed on his home lasix dosing 2 days ago. This morning, appears euvolemic on examination. However, did develop an AKI from the diuresis yesterday and we are holding on his lasix  until he sees cardiology.  - Continue to monitor volume status - Holding diuresis at this time  - Strict I&O - BMP monitoring  Atrial fibrillation, rate controlled:    CHADS-VASc2 score 4, HASBLED score 4 with history of prior GI bleed requiring blood transfusion. Currently rate controlled with coreg. - Continue coreg 6.25mg  bid - Continue cardiac monitoring  Diabetes mellitus:  Currently well controlled on SSI. SGLT-2i was initiated during this admission initially to attempt to minimize readmission for heart  failure exacerbations. However, this was discontinued in setting of increasing serum creatinine.  - Continue CBG monitoring - Continue SSI -  Recommend seeing PCP and suggest to start Jardiance Prior to Admission Living Arrangement: Home Anticipated Discharge Location: Home Barriers to Discharge: None Dispo: Anticipated discharge today.  Honor Junes, MD 06/04/2020, 11:39 AM Pager: [phone] After 5pm on weekdays and 1pm on weekends: On Call pager [phone]

## 2020-06-04 NOTE — Plan of Care (Signed)

## 2020-06-04 NOTE — Discharge Summary (Addendum)
Name: Brandon [name] MRN: [id number] DOB: Apr 21, 1946 74 y.o. PCP: Helane Rima, MD  Date of Admission: 05/25/2020  1:02 PM Date of Discharge: 06/04/2020 Attending Physician: Lalla Brothers, MD  Discharge Diagnosis: 1. Bullous pemphigoid 2. Acute on chronic heart failure (HCC) exacerbation 3. Atrial fibrillation ([place]) 4.  DM (diabetes mellitus), type 2 ([place]) 5.  Hypothermia    Discharge Medications: Allergies as of 06/04/2020   No Known Allergies      Medication List     STOP taking these medications    allopurinol 300 MG tablet Commonly known as: ZYLOPRIM   cholecalciferol 25 MCG (1000 UNIT) tablet Commonly known as: VITAMIN D3   ferrous sulfate 325 (65 FE) MG tablet   folic acid 1 MG tablet Commonly known as: FOLVITE   furosemide 20 MG tablet Commonly known as: LASIX   multivitamin with minerals Tabs tablet   potassium chloride 10 MEQ tablet Commonly known as: KLOR-CON   triamcinolone cream 0.5 % Commonly known as: KENALOG       TAKE these medications    acetaminophen 500 MG tablet Commonly known as: TYLENOL Take 500 mg by mouth every 6 (six) hours as needed for moderate pain.   carvedilol 6.25 MG tablet Commonly known as: COREG Take 6.25 mg by mouth 2 (two) times daily.   clobetasol cream 0.05 % Commonly known as: TEMOVATE Apply topically 2 (two) times daily.   finasteride 5 MG tablet Commonly known as: PROSCAR Take 1 tablet (5 mg total) by mouth daily.   hydrOXYzine 25 MG tablet Commonly known as: ATARAX/VISTARIL Take 1 tablet (25 mg total) by mouth daily.   metFORMIN 500 MG 24 hr tablet Commonly known as: GLUCOPHAGE-XR Take 500 mg by mouth daily with breakfast.   pantoprazole 40 MG tablet Commonly known as: PROTONIX Take 1 tablet (40 mg total) by mouth daily.   polyethylene glycol 17 g packet Commonly known as: MIRALAX / GLYCOLAX Take 17 g by mouth 2 (two) times daily. What changed:  when to take this reasons to take  this additional instructions   pravastatin 10 MG tablet Commonly known as: PRAVACHOL Take 10 mg by mouth at bedtime.   predniSONE 20 MG tablet Commonly known as: DELTASONE Take 1 tablet (20 mg total) by mouth daily with breakfast for 3 days, THEN 0.5 tablets (10 mg total) daily with breakfast for 2 days. Start taking on: June 05, 2020   senna-docusate 8.6-50 MG tablet Commonly known as: Senokot-S Take 1 tablet by mouth 2 (two) times daily. What changed: when to take this   spironolactone 50 MG tablet Commonly known as: ALDACTONE Take 1 tablet (50 mg total) by mouth daily. What changed: Another medication with the same name was removed. Continue taking this medication, and follow the directions you see here.   tamsulosin 0.4 MG Caps capsule Commonly known as: FLOMAX Take 0.4 mg by mouth at bedtime.        Disposition and follow-up:   Mr.Brandon [name] was discharged from [facility] in Stable condition.  At the hospital follow up visit please address:  1.  A). Bullous Pemphigoid : Started on systemic steroids with prednisone 60 mg daily on 05/28/20. Steroid taper started on 06/02/20 ~40 mg daily until 06/04/20 and sent home with further tapering instructions of 20 mg for 3 days ( until 06/07/20) and 10 mg for next 2 days.  B). Acute on chronic HFpEF exacerbation: home dose was resumed on 11/06 as  had a ~1.4 kg weight gain  and his renal function were stable. But that lead to AKI with increase in sCr to 1.73 from 1.12 . Held off his lasix, leading to sCr trending down to 1.14. Please see ypur PCP before re-starting lasix. C). Atrial fibrillation, rate controlled: Continue beta blocker Carvediol 6.25 mg BID D). Diabetes Mellitus: Metformin was continued. Started on SGLT-2i Jardiance to decrease frequency of CHF exacerbations in diabetes. Jardiance was stopped as his Creatinine crept up to 1.65. Please see PCP to re-start it. E). Hypothermia: It improved during  hospitalization. Use warm blankets if needed.   2.  Labs / imaging needed at time of follow-up: None  3.  Pending labs/ test needing follow-up: None  Follow-up Appointments:  Follow-up Information     [facility] Follow up.   Specialty: Home Health Services Why: HHRN,HHPT, [facility] Contact information: [place] [address] [phone]         Loleta Chance, MD. Go on 06/06/2020.   Specialty: Internal Medicine Why: @2 :[place] Hospital Course by problem list: Bullous Pemphigoid:  Patient presented with a erythroderma-like rash that is very pruritic with some bulla.  He has a very elevated peripheral eosinophils.  Also having worsening renal function with creatinine rising from 1.1 up to 1.65.  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  He was started on systemic steroids with prednisone 60 mg daily on 05/28/20.  Continued with supportive care including topical emollients and hydroxyzine for the itching. Stopped  pantoprazole and spironolactone as they are also listed as possible causes for DRESS. Most likely offending medication was allopurinol. His eosinophils started trending down and he was started on Clobetasol cream BID to thighs and abdomen on 11/03. Steroid taper started on 06/02/20 ~40 mg daily until 06/04/20 and sent home with further tapering instructions of 20 mg for 3 days ( until 06/07/20) and 10 mg for next 2 days.Skin rash is looking better, much less pruritic, not totally resolved  Acute on chronic HFpEF exacerbation: Patient was on IV Lasix 60mg  twice daily on 05/25/20.His edema continued to improve and  he had no difficulty breathing.   He has had 1.6 L UOP on 11/1;  and down 2.4kg since admission. Echo with LVEF 60-65%, no RWMA, mild aortic regurgitation that is stable from prior exam.  Clinically started appearing close to euvolemic and had worsening renal function so held off on further diuresis. Likely  to have increased insensible losses due to diffuse rash. His home dose was resumed on 11/06 as he had a ~1.4 kg weight gain and his renal function were stable. But that lead to AKI with increase in sCr to 1.73 from 1.12 .He has had good urine output. However, suspect that this dosing may be too high for him. Held off his lasix, leading to sCr trending down to 1.14.   Atrial fibrillation, rate controlled CHADSVASC2 score 4; HASBLED score 4 with history of GI bleed requiring blood transfusion when on anticoagulation with DAPT in the past. Is currently rate controlled with beta blocker Carvediol 6.25 mg BID.  Diabetes Mellitus: HbA1c 6.9. He is on metformin at home. Patient started on SGLT-2i Jardiance to decrease frequency of CHF exacerbations in diabetes. Jardiance was stopped as his Creatinine crept up to 1.65.   Hypothermia:  He had a new onset mild hypothermia on 05/30/20 and it persisted until 06/02/20. His blood cultures have been negative to date. Suspect  that his hypothermia was secondary to impaired thermoregulation due to disruption of the skin barrier from the bullous pemphigoid rash. He was being peripherally warmed during with warming blanket and his hypothermia improved. Initially he was reluctant to use warm blankets with the fear of exacerbation of itching & blistering.    Discharge Vitals:   BP 119/67 (BP Location: Right Arm)   Pulse 80   Temp 97.8 F (36.6 C) (Oral)   Resp 17   Ht 5\' 6"  (1.676 m)   Wt 295 lb 9.6 oz (134.1 kg)   SpO2 97%   BMI 47.71 kg/m   Pertinent Labs, Studies, and Procedures:  CBC Latest Ref Rng & Units 06/04/2020 06/03/2020 06/02/2020  WBC 4.0 - 10.5 K/uL 18.7(H) 12.7(H) 13.2(H)  Hemoglobin 13.0 - 17.0 g/dL 12.0(L) 12.5(L) 12.0(L)  Hematocrit 39 - 52 % 37.9(L) 39.3 38.3(L)  Platelets 150 - 400 K/uL 137(L) 167 161   BMP Latest Ref Rng & Units 06/04/2020 06/03/2020 06/02/2020  Glucose 70 - 99 mg/dL 131(H) 140(H) 108(H)  BUN 8 - 23 mg/dL 40(H) 50(H) 40(H)   Creatinine 0.61 - 1.24 mg/dL 1.14 1.73(H) 1.12  Sodium 135 - 145 mmol/L 128(L) 134(L) 131(L)  Potassium 3.5 - 5.1 mmol/L 5.2(H) 5.2(H) 5.3(H)  Chloride 98 - 111 mmol/L 98 98 97(L)  CO2 22 - 32 mmol/L 24 26 26   Calcium 8.9 - 10.3 mg/dL 9.1 9.3 9.1  Blood culture: shows no growth from 4 days. MRSA PCR: negative   Chest xray : IMPRESSION: 1. No acute cardiopulmonary disease. 2. Chronic cardiomegaly.   EKG:  Atrial fibrillation S1,S2,S3 pattern Low voltage, precordial leads Borderline prolonged QT interval Baseline wander in lead(s) V2 Partial missing lead(s): V2 No STEMI  Echocardiogram:   IMPRESSIONS   1. Left ventricular ejection fraction, by estimation, is 60 to 65%. The  left ventricle has normal function. The left ventricle has no regional  wall motion abnormalities. There is mild concentric left ventricular  hypertrophy. Left ventricular diastolic  function could not be evaluated.   2. Right ventricular systolic function is mildly reduced. The right  ventricular size is normal.   3. Left atrial size was mildly dilated.   4. The mitral valve is normal in structure. No evidence of mitral valve  regurgitation. No evidence of mitral stenosis.   5. The aortic valve has an indeterminant number of cusps. There is  moderate calcification of the aortic valve. There is mild thickening of  the aortic valve. Aortic valve regurgitation is mild. Mild aortic valve  stenosis. Aortic regurgitation PHT  measures 1499 msec. Aortic valve area, by VTI measures 1.45 cm. Aortic  valve mean gradient measures 11.0 mmHg. Aortic valve Vmax measures 2.21  m/s.   6. The inferior vena cava is normal in size with greater than 50%  respiratory variability, suggesting right atrial pressure of 3 mmHg.   Discharge Instructions: Discharge Instructions     (HEART FAILURE PATIENTS) Call MD:  Anytime you have any of the following symptoms: 1) 3 pound weight gain in 24 hours or 5 pounds in 1 week  2) shortness of breath, with or without a dry hacking cough 3) swelling in the hands, feet or stomach 4) if you have to sleep on extra pillows at night in order to breathe.   Complete by: As directed    Call MD for:  difficulty breathing, headache or visual disturbances   Complete by: As directed    Call MD for:  extreme fatigue   Complete by:  As directed    Call MD for:  hives   Complete by: As directed    Call MD for:  persistant dizziness or light-headedness   Complete by: As directed    Call MD for:  persistant nausea and vomiting   Complete by: As directed    Call MD for:  redness, tenderness, or signs of infection (pain, swelling, redness, odor or green/yellow discharge around incision site)   Complete by: As directed    Call MD for:  severe uncontrolled pain   Complete by: As directed    Call MD for:  temperature >100.4   Complete by: As directed    Diet - low sodium heart healthy   Complete by: As directed    Discharge instructions   Complete by: As directed    Mr Axtman you presented with Acute on Chronic HFpEF and we are holding on to your lasix due to recent AKI and euvolumia today. Plan is to see cardiology and then restart it as needed.  Bullous pemphigoid: You presented with diffuse rash and biopsy confirmed it to be Bullous pemphigoid. You were started on oral Prednisone 60 mg, tapering started in hospital and plan is to take 20 mg for 3 days starting 06/05/20 and then 10 mg for 2 days and then stop it. Continue using Clobetasol cream twice daily on thighs and abdomen. Diabetes Mellitus. Please continue your Metformin and Jardiance was started in hospital but was held due to elevated Creatinine. We suggest you to restart it after seeing your PCP.   Increase activity slowly   Complete by: As directed    No wound care   Complete by: As directed        Signed: Honor Junes, MD 06/05/2020, 7:42 AM   Pager: [phone]

## 2020-06-05 LAB — CULTURE, BLOOD (ROUTINE X 2)
Culture: NO GROWTH
Culture: NO GROWTH
Special Requests: ADEQUATE

## 2020-06-06 ENCOUNTER — Other Ambulatory Visit: Payer: Self-pay

## 2020-06-06 ENCOUNTER — Inpatient Hospital Stay (HOSPITAL_BASED_OUTPATIENT_CLINIC_OR_DEPARTMENT_OTHER)
Admission: EM | Admit: 2020-06-06 | Discharge: 2020-06-08 | DRG: 641 | Disposition: A | Discharge status: Skilled Nursing Facility | Payer: Medicare Other | Attending: Student in an Organized Health Care Education/Training Program | Admitting: Student in an Organized Health Care Education/Training Program

## 2020-06-06 ENCOUNTER — Encounter (HOSPITAL_BASED_OUTPATIENT_CLINIC_OR_DEPARTMENT_OTHER): Payer: Self-pay | Admitting: Emergency Medicine

## 2020-06-06 DIAGNOSIS — B952 Enterococcus as the cause of diseases classified elsewhere: Secondary | ICD-10-CM | POA: Diagnosis not present

## 2020-06-06 DIAGNOSIS — E875 Hyperkalemia: Principal | ICD-10-CM | POA: Diagnosis present

## 2020-06-06 DIAGNOSIS — I4891 Unspecified atrial fibrillation: Secondary | ICD-10-CM | POA: Diagnosis present

## 2020-06-06 DIAGNOSIS — I5032 Chronic diastolic (congestive) heart failure: Secondary | ICD-10-CM | POA: Diagnosis present

## 2020-06-06 DIAGNOSIS — E119 Type 2 diabetes mellitus without complications: Secondary | ICD-10-CM

## 2020-06-06 DIAGNOSIS — Z20822 Contact with and (suspected) exposure to covid-19: Secondary | ICD-10-CM | POA: Diagnosis present

## 2020-06-06 DIAGNOSIS — I4819 Other persistent atrial fibrillation: Secondary | ICD-10-CM | POA: Diagnosis present

## 2020-06-06 DIAGNOSIS — E871 Hypo-osmolality and hyponatremia: Secondary | ICD-10-CM | POA: Diagnosis present

## 2020-06-06 DIAGNOSIS — R81 Glycosuria: Secondary | ICD-10-CM | POA: Diagnosis present

## 2020-06-06 DIAGNOSIS — R5381 Other malaise: Secondary | ICD-10-CM | POA: Diagnosis present

## 2020-06-06 DIAGNOSIS — Z87891 Personal history of nicotine dependence: Secondary | ICD-10-CM

## 2020-06-06 DIAGNOSIS — N390 Urinary tract infection, site not specified: Secondary | ICD-10-CM | POA: Diagnosis present

## 2020-06-06 DIAGNOSIS — R531 Weakness: Secondary | ICD-10-CM

## 2020-06-06 DIAGNOSIS — L120 Bullous pemphigoid: Secondary | ICD-10-CM | POA: Diagnosis not present

## 2020-06-06 DIAGNOSIS — R8281 Pyuria: Secondary | ICD-10-CM | POA: Diagnosis not present

## 2020-06-06 DIAGNOSIS — R21 Rash and other nonspecific skin eruption: Secondary | ICD-10-CM | POA: Diagnosis not present

## 2020-06-06 DIAGNOSIS — Z6841 Body Mass Index (BMI) 40.0 and over, adult: Secondary | ICD-10-CM

## 2020-06-06 DIAGNOSIS — Z800 Family history of malignant neoplasm of digestive organs: Secondary | ICD-10-CM

## 2020-06-06 DIAGNOSIS — Z85038 Personal history of other malignant neoplasm of large intestine: Secondary | ICD-10-CM

## 2020-06-06 LAB — URINALYSIS, ROUTINE W REFLEX MICROSCOPIC
Bilirubin Urine: NEGATIVE
Glucose, UA: >=500 mg/dL — AB
Ketones, ur: NEGATIVE mg/dL
Nitrite: NEGATIVE
Protein, ur: NEGATIVE mg/dL
Specific Gravity, Urine: 1.02 (ref 1.005–1.030)
pH: 5.5 (ref 5.0–8.0)

## 2020-06-06 LAB — CBC WITH DIFFERENTIAL/PLATELET
Abs Immature Granulocytes: 0.19 10*3/uL — ABNORMAL HIGH (ref 0.00–0.07)
Basophils Absolute: 0 10*3/uL (ref 0.0–0.1)
Basophils Relative: 0 %
Eosinophils Absolute: 0.1 10*3/uL (ref 0.0–0.5)
Eosinophils Relative: 1 %
HCT: 43 % (ref 39.0–52.0)
Hemoglobin: 13.3 g/dL (ref 13.0–17.0)
Immature Granulocytes: 1 %
Lymphocytes Relative: 5 %
Lymphs Abs: 0.7 10*3/uL (ref 0.7–4.0)
MCH: 28.1 pg (ref 26.0–34.0)
MCHC: 30.9 g/dL (ref 30.0–36.0)
MCV: 90.9 fL (ref 80.0–100.0)
Monocytes Absolute: 1.1 10*3/uL — ABNORMAL HIGH (ref 0.1–1.0)
Monocytes Relative: 8 %
Neutro Abs: 11.1 10*3/uL — ABNORMAL HIGH (ref 1.7–7.7)
Neutrophils Relative %: 85 %
Platelets: 158 10*3/uL (ref 150–400)
RBC: 4.73 MIL/uL (ref 4.22–5.81)
RDW: 16.5 % — ABNORMAL HIGH (ref 11.5–15.5)
WBC: 13.1 10*3/uL — ABNORMAL HIGH (ref 4.0–10.5)
nRBC: 0 % (ref 0.0–0.2)

## 2020-06-06 LAB — BASIC METABOLIC PANEL
Anion gap: 9 (ref 5–15)
BUN: 37 mg/dL — ABNORMAL HIGH (ref 8–23)
CO2: 23 mmol/L (ref 22–32)
Calcium: 9.3 mg/dL (ref 8.9–10.3)
Chloride: 102 mmol/L (ref 98–111)
Creatinine, Ser: 1.05 mg/dL (ref 0.61–1.24)
GFR, Estimated: >60 mL/min (ref >60)
Glucose, Bld: 188 mg/dL — ABNORMAL HIGH (ref 70–99)
Potassium: 4.8 mmol/L (ref 3.5–5.1)
Sodium: 134 mmol/L — ABNORMAL LOW (ref 135–145)

## 2020-06-06 LAB — COMPREHENSIVE METABOLIC PANEL
ALT: 107 U/L — ABNORMAL HIGH (ref 0–44)
AST: 74 U/L — ABNORMAL HIGH (ref 15–41)
Albumin: 2.7 g/dL — ABNORMAL LOW (ref 3.5–5.0)
Alkaline Phosphatase: 164 U/L — ABNORMAL HIGH (ref 38–126)
Anion gap: 9 (ref 5–15)
BUN: 25 mg/dL — ABNORMAL HIGH (ref 8–23)
CO2: 22 mmol/L (ref 22–32)
Calcium: 9.1 mg/dL (ref 8.9–10.3)
Chloride: 103 mmol/L (ref 98–111)
Creatinine, Ser: 0.79 mg/dL (ref 0.61–1.24)
GFR, Estimated: >60 mL/min (ref >60)
Glucose, Bld: 121 mg/dL — ABNORMAL HIGH (ref 70–99)
Potassium: 6.1 mmol/L — ABNORMAL HIGH (ref 3.5–5.1)
Sodium: 134 mmol/L — ABNORMAL LOW (ref 135–145)
Total Bilirubin: 1.4 mg/dL — ABNORMAL HIGH (ref 0.3–1.2)
Total Protein: 6.1 g/dL — ABNORMAL LOW (ref 6.5–8.1)

## 2020-06-06 LAB — URINALYSIS, MICROSCOPIC (REFLEX): WBC, UA: >50 WBC/hpf (ref 0–5)

## 2020-06-06 LAB — RESPIRATORY PANEL BY RT PCR (FLU A&B, COVID)
Influenza A by PCR: NEGATIVE
Influenza B by PCR: NEGATIVE
SARS Coronavirus 2 by RT PCR: NEGATIVE

## 2020-06-06 LAB — GLUCOSE, CAPILLARY
Glucose-Capillary: 108 mg/dL — ABNORMAL HIGH (ref 70–99)
Glucose-Capillary: 112 mg/dL — ABNORMAL HIGH (ref 70–99)

## 2020-06-06 MED ORDER — SODIUM CHLORIDE 0.9 % IV BOLUS
250.0000 mL | Freq: Once | INTRAVENOUS | Status: AC
  Administered 2020-06-06: 250 mL via INTRAVENOUS

## 2020-06-06 MED ORDER — SPIRONOLACTONE 25 MG PO TABS
50.0000 mg | ORAL_TABLET | Freq: Every day | ORAL | Status: DC
  Administered 2020-06-07: 50 mg via ORAL
  Filled 2020-06-06: qty 2

## 2020-06-06 MED ORDER — FINASTERIDE 5 MG PO TABS
5.0000 mg | ORAL_TABLET | Freq: Every day | ORAL | Status: DC
  Administered 2020-06-07 – 2020-06-08 (×2): 5 mg via ORAL
  Filled 2020-06-06 (×2): qty 1

## 2020-06-06 MED ORDER — ENOXAPARIN SODIUM 40 MG/0.4ML ~~LOC~~ SOLN: 40.0000 mg | Freq: Every day | SUBCUTANEOUS | Status: DC

## 2020-06-06 MED ORDER — NYSTATIN 100000 UNIT/GM EX POWD
Freq: Two times a day (BID) | CUTANEOUS | Status: DC
  Filled 2020-06-06: qty 15

## 2020-06-06 MED ORDER — RIVAROXABAN 10 MG PO TABS
10.0000 mg | ORAL_TABLET | Freq: Every day | ORAL | Status: DC
  Administered 2020-06-06 – 2020-06-08 (×3): 10 mg via ORAL
  Filled 2020-06-06 (×3): qty 1

## 2020-06-06 MED ORDER — PRAVASTATIN SODIUM 10 MG PO TABS
10.0000 mg | ORAL_TABLET | Freq: Every day | ORAL | Status: DC
  Administered 2020-06-06 – 2020-06-07 (×2): 10 mg via ORAL
  Filled 2020-06-06 (×2): qty 1

## 2020-06-06 MED ORDER — CARVEDILOL 6.25 MG PO TABS
6.2500 mg | ORAL_TABLET | Freq: Two times a day (BID) | ORAL | Status: DC
  Administered 2020-06-06 – 2020-06-08 (×4): 6.25 mg via ORAL
  Filled 2020-06-06 (×4): qty 1

## 2020-06-06 MED ORDER — TAMSULOSIN HCL 0.4 MG PO CAPS
0.4000 mg | ORAL_CAPSULE | Freq: Every day | ORAL | Status: DC
  Administered 2020-06-06 – 2020-06-07 (×2): 0.4 mg via ORAL
  Filled 2020-06-06 (×2): qty 1

## 2020-06-06 MED ORDER — SODIUM CHLORIDE 0.9 % IV SOLN
1.0000 g | Freq: Once | INTRAVENOUS | Status: AC
  Administered 2020-06-06: 1 g via INTRAVENOUS
  Filled 2020-06-06: qty 10

## 2020-06-06 MED ORDER — SODIUM CHLORIDE 0.9 % IV BOLUS: 500.0000 mL | Freq: Once | INTRAVENOUS | Status: DC

## 2020-06-06 MED ORDER — CLOBETASOL PROPIONATE 0.05 % EX CREA
TOPICAL_CREAM | Freq: Two times a day (BID) | CUTANEOUS | Status: DC
  Filled 2020-06-06: qty 15

## 2020-06-06 MED ORDER — INSULIN ASPART 100 UNIT/ML ~~LOC~~ SOLN
0.0000 [IU] | Freq: Three times a day (TID) | SUBCUTANEOUS | Status: DC
  Administered 2020-06-07: 2 [IU] via SUBCUTANEOUS
  Administered 2020-06-07: 0 [IU] via SUBCUTANEOUS
  Administered 2020-06-07: 3 [IU] via SUBCUTANEOUS
  Administered 2020-06-08 (×2): 2 [IU] via SUBCUTANEOUS

## 2020-06-06 MED ORDER — ENOXAPARIN SODIUM 40 MG/0.4ML ~~LOC~~ SOLN: 40.0000 mg | SUBCUTANEOUS | Status: DC

## 2020-06-06 MED ORDER — PANTOPRAZOLE SODIUM 40 MG PO TBEC
40.0000 mg | DELAYED_RELEASE_TABLET | Freq: Every day | ORAL | Status: DC
  Administered 2020-06-06 – 2020-06-08 (×3): 40 mg via ORAL
  Filled 2020-06-06 (×3): qty 1

## 2020-06-06 MED ORDER — PREDNISONE 20 MG PO TABS
20.0000 mg | ORAL_TABLET | Freq: Every day | ORAL | Status: AC
  Administered 2020-06-06 – 2020-06-08 (×2): 20 mg via ORAL
  Filled 2020-06-06 (×2): qty 1

## 2020-06-06 NOTE — ED Notes (Signed)
Report given to Ernst Spell, receiving nurse at [facility]

## 2020-06-06 NOTE — ED Notes (Signed)
Called to give report to floor nurse, but was unavailable. Will call back

## 2020-06-06 NOTE — ED Notes (Signed)
Patient is resting comfortably. 

## 2020-06-06 NOTE — Progress Notes (Addendum)
Received pt from [place] via [place]. Pt appears in no distress. Pt reports 5/10 discomfort from generalized body rash. Pt clothing unclean, poor body hygiene and body odor noted.

## 2020-06-06 NOTE — ED Notes (Signed)
Report given to Casey RN with [facility] ?

## 2020-06-06 NOTE — ED Provider Notes (Signed)
[place] DEPT [facility] Provider Note: Brandon Spurling, MD, FACEP  CSN: [id number] MRN: [id number] ARRIVAL: 06/06/20 at [place]: [place]  Weakness   HISTORY OF PRESENT ILLNESS  06/06/20 3:00 AM Brandon [name] is a 74 y.o. male who was admitted to 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  He was discharged on 06/04/2020 on a prednisone taper.  Several other medications were changed at discharge.  He felt generally weak on discharge and this is worsened since he has been home.  He has been eating and drinking well.  He has had no nausea, vomiting or abdominal pain.  This morning went to use the bathroom (bowel movement) and became so weak he could not stand up by himself.  He lowered himself to the floor and his mother called EMS.  He states the rash, for which he was admitted, has been improving.   Past Medical History:  Diagnosis Date  . Anemia 07/2019   Hb 6.7 >> 3 PRBCs >> 8.8 in 07/2019.     Marland Kitchen Atrial fibrillation ([place])   . Cholelithiasis   . Cirrhosis of liver ([place])    Presumed due to EtOH as well as fatty liver from morbid obesity.  Cirrhosis evident on CT scan 07/2018 but finding overlooked and formal diagnoses not made until 07/2019  . Colon cancer ([place]) 1999   Partial colectomy  . Coronary artery disease   . Diabetes mellitus without complication ([place])   . Diverticulosis   . Esophageal varices ([place])   . Gastric AVM   . 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 Obesity 07/2019  . Portal hypertension ([place])   . Tubular adenoma of colon     Past Surgical History:  Procedure Laterality Date  . APPLICATION OF WOUND VAC Right 09/30/2018   Procedure: Application Of Wound Vac;  Surgeon: Altamese Sarasota, MD;  Location: [facility] [place];  Service: Orthopedics;  Laterality: Right;  . COLONOSCOPY  2010   Removed 2 polyps of unclear type.  Not performed in [place]  .  COLONOSCOPY WITH PROPOFOL N/A 09/17/2019   Procedure: COLONOSCOPY WITH PROPOFOL;  Surgeon: [name], MD;  Location: [place];  Service: Endoscopy;  Laterality: N/A;  . CORONARY ARTERY BYPASS GRAFT     in his 77s  . ESOPHAGOGASTRODUODENOSCOPY (EGD) WITH PROPOFOL N/A 08/14/2019   Procedure: ESOPHAGOGASTRODUODENOSCOPY (EGD) WITH PROPOFOL;  Surgeon: [name], MD;  Location: [facility];  Service: Gastroenterology;  Laterality: N/A;  . ESOPHAGOGASTRODUODENOSCOPY (EGD) WITH PROPOFOL N/A 09/17/2019   Procedure: ESOPHAGOGASTRODUODENOSCOPY (EGD) WITH PROPOFOL;  Surgeon: [name], MD;  Location: [place];  Service: Endoscopy;  Laterality: N/A;  . ESOPHAGOGASTRODUODENOSCOPY (EGD) WITH PROPOFOL N/A 10/04/2019   Procedure: ESOPHAGOGASTRODUODENOSCOPY (EGD) WITH PROPOFOL;  Surgeon: Mauri Pole, MD;  Location: [facility] ENDOSCOPY;  Service: Endoscopy;  Laterality: N/A;  . GIVENS CAPSULE STUDY N/A 09/17/2019   Procedure: GIVENS CAPSULE STUDY;  Surgeon: [name], MD;  Location: [place];  Service: Endoscopy;  Laterality: N/A;  . HOT HEMOSTASIS N/A 08/14/2019   Procedure: HOT HEMOSTASIS (ARGON PLASMA COAGULATION/BICAP);  Surgeon: [name], MD;  Location: [facility] ENDOSCOPY;  Service: Gastroenterology;  Laterality: N/A;  . HOT HEMOSTASIS N/A 09/17/2019   Procedure: HOT HEMOSTASIS (ARGON PLASMA COAGULATION/BICAP);  Surgeon: [name], MD;  Location: [place];  Service: Endoscopy;  Laterality: N/A;  . PARTIAL COLECTOMY  2003   To address colon cancer  . PATELLAR  TENDON REPAIR Right 09/30/2018   Procedure: PATELLA TENDON REPAIR;  Surgeon: Altamese Robert Lee, MD;  Location: [place];  Service: Orthopedics;  Laterality: Right;  . POLYPECTOMY  09/17/2019   Procedure: POLYPECTOMY;  Surgeon: [name], MD;  Location: [facility] ENDOSCOPY;  Service: Endoscopy;;    Family History  Problem Relation Age of Onset  . Colon cancer Father   . CAD Other   . Colon cancer Paternal Uncle   . Colon cancer  Paternal Uncle   . Diabetes Neg Hx     Social History   Tobacco Use  . Smoking status: Former Smoker    Types: Cigarettes    Quit date: 1990    Years since quitting: 31.8  . Smokeless tobacco: Never Used  Vaping Use  . Vaping Use: Never used  Substance Use Topics  . 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 Drug use: Never    Prior to Admission medications   Medication Sig Start Date End Date Taking? Authorizing Provider  acetaminophen (TYLENOL) 500 MG tablet Take 500 mg by mouth every 6 (six) hours as needed for moderate pain.    [provider]  carvedilol (COREG) 6.25 MG tablet Take 6.25 mg by mouth 2 (two) times daily. 10/27/19   [provider]  clobetasol cream (TEMOVATE) 0.05 % Apply topically 2 (two) times daily. 06/04/20   Dagar, Meredith Staggers, MD  finasteride (PROSCAR) 5 MG tablet Take 1 tablet (5 mg total) by mouth daily. 06/05/20   Dagar, Meredith Staggers, MD  hydrOXYzine (ATARAX/VISTARIL) 25 MG tablet Take 1 tablet (25 mg total) by mouth daily. 06/04/20   Dagar, Meredith Staggers, MD  metFORMIN (GLUCOPHAGE-XR) 500 MG 24 hr tablet Take 500 mg by mouth daily with breakfast.  02/18/18   [provider]  pantoprazole (PROTONIX) 40 MG tablet Take 1 tablet (40 mg total) by mouth daily. 11/08/19 05/25/20  Esterwood, Amy S, PA-C  polyethylene glycol (MIRALAX / GLYCOLAX) packet Take 17 g by mouth 2 (two) times daily. Patient taking differently: Take 17 g by mouth daily as needed for mild constipation. Mix in coffee and drink 10/08/18   [name] C, DO  pravastatin (PRAVACHOL) 10 MG tablet Take 10 mg by mouth at bedtime. 02/18/18   [provider]  predniSONE (DELTASONE) 20 MG tablet Take 1 tablet (20 mg total) by mouth daily with breakfast for 3 days, THEN 0.5 tablets (10 mg total) daily with breakfast for 2 days. 06/05/20 06/10/20  Dagar, Meredith Staggers, MD  senna-docusate (SENOKOT-S) 8.6-50 MG tablet Take 1 tablet by mouth 2 (two) times  daily. Patient taking differently: Take 1 tablet by mouth daily.  10/08/18   [name], DO  spironolactone (ALDACTONE) 50 MG tablet Take 1 tablet (50 mg total) by mouth daily. 11/08/19   Esterwood, Amy S, PA-C  tamsulosin (FLOMAX) 0.4 MG CAPS capsule Take 0.4 mg by mouth at bedtime. 02/18/18   [provider]    Allergies Patient has no known allergies.   REVIEW OF SYSTEMS  Negative except as noted here or in the History of Present Illness.   PHYSICAL EXAMINATION  Initial Vital Signs Blood pressure (!) 150/78, pulse 82, temperature 97.9 F (36.6 C), temperature source Oral, resp. rate 18, height 5\' 6"  (1.676 m), weight 133.8 kg, SpO2 99 %.  Examination General: Well-developed, well-nourished male in no acute distress; appearance consistent with age of record HENT: normocephalic; atraumatic Eyes: pupils equal, round and reactive to light; extraocular muscles intact  Neck: supple Heart: Irregular rhythm Lungs: clear to auscultation bilaterally Abdomen: soft; obese; nontender; ventral hernia; bowel sounds present Extremities: Arthritic changes, notably of feet Neurologic: Awake, alert and oriented; motor function intact in all extremities and symmetric; no facial droop Skin: Warm and dry; generalized scaly papular rash:    Psychiatric: Normal mood and affect   RESULTS  Summary of this visit's results, reviewed and interpreted by myself:   EKG Interpretation  Date/Time:  Wednesday June 06 2020 03:27:31 EST Ventricular Rate:  84 PR Interval:    QRS Duration: 112 QT Interval:  376 QTC Calculation: 445 R Axis:   0 Text Interpretation: Atrial fibrillation Ventricular premature complex Incomplete right bundle branch block Low voltage, precordial leads No significant change was found Confirmed by Reinaldo Helt, Jenny Reichmann [phone]) on 06/06/2020 3:43:14 AM      Laboratory Studies: Results for orders placed or performed during the hospital encounter of 06/06/20 (from the  past 24 hour(s))  Basic metabolic panel     Status: Abnormal   Collection Time: 06/06/20  2:44 AM  Result Value Ref Range   Sodium 134 (L) 135 - 145 mmol/L   Potassium 4.8 3.5 - 5.1 mmol/L   Chloride 102 98 - 111 mmol/L   CO2 23 22 - 32 mmol/L   Glucose, Bld 188 (H) 70 - 99 mg/dL   BUN 37 (H) 8 - 23 mg/dL   Creatinine, Ser 1.05 0.61 - 1.24 mg/dL   Calcium 9.3 8.9 - 10.3 mg/dL   GFR, Estimated >60 >60 mL/min   Anion gap 9 5 - 15  CBC with Differential/Platelet     Status: Abnormal   Collection Time: 06/06/20  2:44 AM  Result Value Ref Range   WBC 13.1 (H) 4.0 - 10.5 K/uL   RBC 4.73 4.22 - 5.81 MIL/uL   Hemoglobin 13.3 13.0 - 17.0 g/dL   HCT 43.0 39 - 52 %   MCV 90.9 80.0 - 100.0 fL   MCH 28.1 26.0 - 34.0 pg   MCHC 30.9 30.0 - 36.0 g/dL   RDW 16.5 (H) 11.5 - 15.5 %   Platelets 158 150 - 400 K/uL   nRBC 0.0 0.0 - 0.2 %   Neutrophils Relative % 85 %   Neutro Abs 11.1 (H) 1.7 - 7.7 K/uL   Lymphocytes Relative 5 %   Lymphs Abs 0.7 0.7 - 4.0 K/uL   Monocytes Relative 8 %   Monocytes Absolute 1.1 (H) 0.1 - 1.0 K/uL   Eosinophils Relative 1 %   Eosinophils Absolute 0.1 0.0 - 0.5 K/uL   Basophils Relative 0 %   Basophils Absolute 0.0 0.0 - 0.1 K/uL   Immature Granulocytes 1 %   Abs Immature Granulocytes 0.19 (H) 0.00 - 0.07 K/uL  Urinalysis, Routine w reflex microscopic Urine, Clean Catch     Status: Abnormal   Collection Time: 06/06/20  5:30 AM  Result Value Ref Range   Color, Urine YELLOW YELLOW   APPearance CLOUDY (A) CLEAR   Specific Gravity, Urine 1.020 1.005 - 1.030   pH 5.5 5.0 - 8.0   Glucose, UA >=500 (A) NEGATIVE mg/dL   Hgb urine dipstick MODERATE (A) NEGATIVE   Bilirubin Urine NEGATIVE NEGATIVE   Ketones, ur NEGATIVE NEGATIVE mg/dL   Protein, ur NEGATIVE NEGATIVE mg/dL   Nitrite NEGATIVE NEGATIVE   Leukocytes,Ua MODERATE (A) NEGATIVE  Urinalysis, Microscopic (reflex)     Status: Abnormal   Collection Time: 06/06/20  5:30 AM  Result Value Ref Range   RBC /  HPF  6-10 0 - 5 RBC/hpf   WBC, UA >50 0 - 5 WBC/hpf   Bacteria, UA MANY (A) NONE SEEN   Squamous Epithelial / LPF 0-5 0 - 5   Imaging Studies: No results found.  ED COURSE and MDM  Nursing notes, initial and subsequent vitals signs, including pulse oximetry, reviewed and interpreted by myself.  Vitals:   06/06/20 0300 06/06/20 0330 06/06/20 0415 06/06/20 0500  BP: (!) 176/112 (!) 177/98 (!) 167/93 (!) 163/101  Pulse:  84 84 (!) 59  Resp:  15 16 15   Temp:      TempSrc:      SpO2:  99% 99% 94%  Weight:      Height:       Medications  cefTRIAXone (ROCEPHIN) 1 g in sodium chloride 0.9 % 100 mL IVPB (has no administration in time range)  sodium chloride 0.9 % bolus 250 mL (0 mLs Intravenous Stopped 06/06/20 0454)   5:54 AM Rocephin 1 g IV ordered for urinary tract infection.  This is likely the cause of his persistent weakness.  6:27 AM Talked to resident teaching service resident about patient's need for readmission. He will place admission orders.   PROCEDURES  Procedures   ED DIAGNOSES     ICD-10-CM   1. Lower urinary tract infectious disease  N39.0   2. Generalized weakness  R53.1        Hosteen Kienast, Jenny Reichmann, MD 06/06/20 [phone]

## 2020-06-06 NOTE — H&P (Addendum)
Date: 06/06/2020               Patient Name:  Brandon [name] MRN: [id number]  DOB: June 09, 1946 Age / Sex: 74 y.o., male   PCP: Helane Rima, MD         Medical Service: Internal Medicine Teaching Service         Attending Physician: Dr. Evette Doffing, Mallie Mussel, *    First Contact: Dr. Collene Gobble Pager: [phone]  Second Contact: Dr. Marva Panda Pager: [phone]       After Hours (After 5p/  First Contact Pager: [phone]  weekends / holidays): Second Contact Pager: [phone]   Chief Complaint: generalized weakness  History of Present Illness: 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 Patient reports he went to the bathroom at midnight last night and could not stand up from the toilet. At the time he also experienced dizziness. Denies LOC.  He says he slowly lowered himself to the floor, did not hit head. Denies chest pain, palpitations, fevers, blurry vision, shortness of breath. Since discharge, patient reports he has felt weak, but the weakness has worsened. He notes that he has been eating and drinking well. Denies abdominal pain, nausea, vomiting, diarrhea, urinary frequency. Reports he did not have burning with urination prior to arrival, but is experiencing some now. Of note, mentions his rash has improved since discharge two days ago.  In ED, patient afebrile and HDS. Leukocytosis present, although decreased from two days prior. UA revealed pyuria and glucosuria. Patient given one dose Rocephin in ED. IMTS consulted for admission for generalized weakness.   Meds:  Current Meds  Medication Sig   acetaminophen (TYLENOL) 500 MG tablet Take 500 mg by mouth every 6 (six) hours as needed for moderate pain.   carvedilol (COREG) 6.25 MG tablet Take 6.25 mg by mouth  2 (two) times daily.   clobetasol cream (TEMOVATE) 0.05 % Apply topically 2 (two) times daily.   finasteride (PROSCAR) 5 MG tablet Take 1 tablet (5 mg total) by mouth daily.   hydrOXYzine (ATARAX/VISTARIL) 25 MG tablet Take 1 tablet (25 mg total) by mouth daily.   metFORMIN (GLUCOPHAGE-XR) 500 MG 24 hr tablet Take 500 mg by mouth daily with breakfast.    pantoprazole (PROTONIX) 40 MG tablet Take 1 tablet (40 mg total) by mouth daily.   polyethylene glycol (MIRALAX / GLYCOLAX) packet Take 17 g by mouth 2 (two) times daily. (Patient taking differently: Take 17 g by mouth daily as needed for mild constipation. Mix in coffee and drink)   pravastatin (PRAVACHOL) 10 MG tablet Take 10 mg by mouth at bedtime.   senna-docusate (SENOKOT-S) 8.6-50 MG tablet Take 1 tablet by mouth 2 (two) times daily. (Patient taking differently: Take 1 tablet by mouth daily. )   spironolactone (ALDACTONE) 50 MG tablet Take 1 tablet (50 mg total) by mouth daily.   tamsulosin (FLOMAX) 0.4 MG CAPS capsule Take 0.4 mg by mouth at bedtime.   Allergies: Allergies as of 06/06/2020   (No Known Allergies)   Past Medical History:  Diagnosis Date   Anemia 07/2019   Hb 6.7 >> 3 PRBCs >> 8.8 in 07/2019.      Atrial fibrillation ([place])    Cholelithiasis    Cirrhosis of liver ([place])    Presumed due to EtOH  as well as fatty liver from morbid obesity.  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   Obesity 07/2019   Portal hypertension (HCC)    Tubular adenoma of colon    Family History:  Family History  Problem Relation Age of Onset   Colon cancer Father    CAD Other    Colon cancer Paternal Uncle    Colon cancer Paternal  Uncle    Diabetes Neg Hx    Social History:  Social History   Tobacco Use   Smoking status: Former Smoker    Types: Cigarettes    Quit date: 1990    Years since quitting: 31.8   Smokeless tobacco: Never Used  Scientific laboratory technician Use: Never used  Substance Use Topics   Alcohol use: Not Currently    Comment: 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   Physical Exam: Blood pressure (!) 159/98, pulse 72, temperature 97.6 F (36.4 C), temperature source Oral, resp. rate 16, height 5\' 6"  (1.676 m), weight 133.8 kg, SpO2 99 %. Physical Exam Constitutional:      General: He is awake. He is not in acute distress.    Appearance: He is morbidly obese. He is not ill-appearing.  HENT:     Head: Normocephalic and atraumatic.     Mouth/Throat:     Mouth: Mucous membranes are moist.  Eyes:     General: Lids are normal. No scleral icterus. Cardiovascular:     Rate and Rhythm: Normal rate. Rhythm irregular.     Heart sounds: Normal heart sounds.  Pulmonary:     Effort: Pulmonary effort is normal. No accessory muscle usage.  Abdominal:     General: Bowel sounds are normal. There is no distension.     Palpations: Abdomen is soft.     Tenderness: There is no abdominal tenderness.  Skin:    General: Skin is warm and dry.     Findings: Erythema and rash present. Rash is crusting and scaling.  Neurological:     Mental Status: He is alert and oriented to person, place, and time.  Psychiatric:        Behavior: Behavior is cooperative.        Thought Content: Thought content normal.        Cognition and Memory: Cognition normal.    EKG: personally reviewed my interpretation is atrial fibrillation w/ PVC  CBC Latest Ref Rng & Units 06/06/2020 06/04/2020 06/03/2020  WBC 4.0 - 10.5 K/uL 13.1(H) 18.7(H) 12.7(H)  Hemoglobin 13.0 - 17.0 g/dL 13.3 12.0(L) 12.5(L)  Hematocrit 39 - 52 % 43.0 37.9(L) 39.3  Platelets  150 - 400 K/uL 158 137(L) 167   CMP Latest Ref Rng & Units 06/06/2020 06/04/2020 06/03/2020  Glucose 70 - 99 mg/dL 188(H) 131(H) 140(H)  BUN 8 - 23 mg/dL 37(H) 40(H) 50(H)  Creatinine 0.61 - 1.24 mg/dL 1.05 1.14 1.73(H)  Sodium 135 - 145 mmol/L 134(L) 128(L) 134(L)  Potassium 3.5 - 5.1 mmol/L 4.8 5.2(H) 5.2(H)  Chloride 98 - 111 mmol/L 102 98 98  CO2 22 - 32 mmol/L 23 24 26   Calcium 8.9 - 10.3 mg/dL 9.3 9.1 9.3  Total Protein 6.5 - 8.1 g/dL - - -  Total Bilirubin 0.3 - 1.2 mg/dL - - -  Alkaline Phos 38 - 126 U/L - - -  AST 15 - 41 U/L - - -  ALT 0 - 44 U/L - - -    Assessment & Plan by Problem: Mr. 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  Principal Problem:   Physical deconditioning Active Problems:   DM (diabetes mellitus), type 2 ([place])   Atrial fibrillation (HCC)   Bullous pemphigoid   Chronic heart failure with preserved ejection fraction (HFpEF) (HCC)   Hyperkalemia  #Generalized weakness #[place] #Denconditioning Patient had weakness upon discharge on 11/8. Patient reports worsening weakness until he could not stand from toilet last night. Patient lowered himself from toilet, reports no fall or hitting head. No symptoms concerning for syncope or pre-syncopal episode. No fevers, diarrhea, shortness of breath. Appears euvolemic on exam. Denies urinary symptoms prior to arrival, although says he has some intermittent burning with urination now. UA with pyuria, although improved from UA during last admission. Received 1 dose Rocephin in ED. Likely generalized weakness 2/2 deconditioning. Will continue to monitor for urinary symptoms. Can re-start abx if continues to have symptoms. During last hospitalization patient declined rehabilitation facility. Will have PT/OT  evaluate and discuss options with patient. - D/c antibiotics - UCx pending - PT/OT evaluation  #Chronic diastolic heart failure TTE last admission w/ EF 60-65%, mild AR. Patient on arrival denies shortness of breath, chest pain, palpitations. Appears euvolemic on exam. Will continue home medications and continue to monitor volume status. - C/w home carvedilol, spironolactone - CBC, CMP monitoring  #Atrial Fibrillation Heart rate normal on arrival with irregular rhythm. CHADSVASC2 4, HASBLED 4. Patient has hx of GI bleed when previously on anticoagulation w/ DAPT. Will continue home rate control. - C/w home carvedilol - Tele  #Bullous Pemphigoid Patient had rash during last admission. Obtained punch biopsy, confirmed diagnosis of bullous pemphigoid. He was started on systemic steroids and sent home with a taper for total of 12 days. Patient on arrival reports improved rash and symptoms. Will need three more days  - Last day prednisone 20mg  tomorrow - Will need two more days 10mg  afterward - CBG monitoring  #Diabetes Mellitus Recent A1c 6.9. Glucose 188 on arrival. Will continue with SSI. Need close monitoring given systemic steroids. - CBG monitoring TID WC, QHS - SSI  DIET: HH IVF: n/a DVT PPX: Xarelto CODE: FULL FAM COM: Will discuss with patient tomorrow if patient would like [place] to contact family.  Dispo: Admit patient to Observation with expected length of stay less than 2 midnights.  Signed: Sanjuan Dame, MD 06/06/2020, 6:24 PM  Pager: [phone] After 5pm on weekdays and 1pm on weekends: On Call pager: [phone]

## 2020-06-06 NOTE — ED Triage Notes (Signed)
Pt brought in by EMS from home with c/o general weakness  EM[facility] with c/o general body rash  Pt had multiple medicine changes  This evening pt attempted to stand up after using the bathroom and became weak  Pt lowered himself to the floor  EMS was called  Pt states his weakness has been getting worse

## 2020-06-07 DIAGNOSIS — E871 Hypo-osmolality and hyponatremia: Secondary | ICD-10-CM | POA: Diagnosis present

## 2020-06-07 DIAGNOSIS — B952 Enterococcus as the cause of diseases classified elsewhere: Secondary | ICD-10-CM | POA: Diagnosis present

## 2020-06-07 DIAGNOSIS — E875 Hyperkalemia: Principal | ICD-10-CM

## 2020-06-07 DIAGNOSIS — Z6841 Body Mass Index (BMI) 40.0 and over, adult: Secondary | ICD-10-CM | POA: Diagnosis not present

## 2020-06-07 DIAGNOSIS — E119 Type 2 diabetes mellitus without complications: Secondary | ICD-10-CM | POA: Diagnosis present

## 2020-06-07 DIAGNOSIS — I4891 Unspecified atrial fibrillation: Secondary | ICD-10-CM | POA: Diagnosis not present

## 2020-06-07 DIAGNOSIS — R5381 Other malaise: Secondary | ICD-10-CM | POA: Diagnosis not present

## 2020-06-07 DIAGNOSIS — N390 Urinary tract infection, site not specified: Secondary | ICD-10-CM | POA: Diagnosis present

## 2020-06-07 DIAGNOSIS — R531 Weakness: Secondary | ICD-10-CM

## 2020-06-07 DIAGNOSIS — R81 Glycosuria: Secondary | ICD-10-CM | POA: Diagnosis present

## 2020-06-07 DIAGNOSIS — Z87891 Personal history of nicotine dependence: Secondary | ICD-10-CM | POA: Diagnosis not present

## 2020-06-07 DIAGNOSIS — L120 Bullous pemphigoid: Secondary | ICD-10-CM | POA: Diagnosis present

## 2020-06-07 DIAGNOSIS — I5032 Chronic diastolic (congestive) heart failure: Secondary | ICD-10-CM | POA: Diagnosis present

## 2020-06-07 DIAGNOSIS — R21 Rash and other nonspecific skin eruption: Secondary | ICD-10-CM | POA: Diagnosis present

## 2020-06-07 DIAGNOSIS — Z85038 Personal history of other malignant neoplasm of large intestine: Secondary | ICD-10-CM | POA: Diagnosis not present

## 2020-06-07 DIAGNOSIS — I4819 Other persistent atrial fibrillation: Secondary | ICD-10-CM | POA: Diagnosis present

## 2020-06-07 DIAGNOSIS — R8281 Pyuria: Secondary | ICD-10-CM | POA: Diagnosis present

## 2020-06-07 DIAGNOSIS — Z20822 Contact with and (suspected) exposure to covid-19: Secondary | ICD-10-CM | POA: Diagnosis present

## 2020-06-07 DIAGNOSIS — Z800 Family history of malignant neoplasm of digestive organs: Secondary | ICD-10-CM | POA: Diagnosis not present

## 2020-06-07 LAB — CBC
HCT: 40.3 % (ref 39.0–52.0)
Hemoglobin: 12.5 g/dL — ABNORMAL LOW (ref 13.0–17.0)
MCH: 28.5 pg (ref 26.0–34.0)
MCHC: 31 g/dL (ref 30.0–36.0)
MCV: 92 fL (ref 80.0–100.0)
Platelets: 144 10*3/uL — ABNORMAL LOW (ref 150–400)
RBC: 4.38 MIL/uL (ref 4.22–5.81)
RDW: 16.6 % — ABNORMAL HIGH (ref 11.5–15.5)
WBC: 13.4 10*3/uL — ABNORMAL HIGH (ref 4.0–10.5)
nRBC: 0 % (ref 0.0–0.2)

## 2020-06-07 LAB — GLUCOSE, CAPILLARY
Glucose-Capillary: 140 mg/dL — ABNORMAL HIGH (ref 70–99)
Glucose-Capillary: 170 mg/dL — ABNORMAL HIGH (ref 70–99)
Glucose-Capillary: 94 mg/dL (ref 70–99)
Glucose-Capillary: 149 mg/dL — ABNORMAL HIGH (ref 70–99)

## 2020-06-07 MED ORDER — SODIUM ZIRCONIUM CYCLOSILICATE 5 G PO PACK
5.0000 g | PACK | Freq: Two times a day (BID) | ORAL | Status: DC
  Administered 2020-06-07 – 2020-06-08 (×3): 5 g via ORAL
  Filled 2020-06-07 (×4): qty 1

## 2020-06-07 NOTE — TOC Initial Note (Addendum)
Transition of Care [facility]) - Initial/Assessment Note    Patient Details  Name: Brandon [name] MRN: [id number] Date of Birth: May 30, 1946  Transition [facility]) CM/SW Contact:    Marilu Favre, RN Phone Number: 06/07/2020, 11:50 AM  Clinical Narrative:                 Patient from home with mother.   Lives in a handicap apartment . Has walker and  ,raised  commode seat .  Await PT note. Patient states they are recommending SNF and he is in agreement. He has been to [facility] in the past.   Active [facility] for HHRN,PT and OT. Amy with [facility] aware of admission.   NCM will start work up for SNF and provide offers once received. Will start insurance auth.  Patient has had both covid Pfizer vaccinations in August 2021.  Insurance authorization for SNF started ref number [id number], fax [phone] Provided bed offers, will follow up on patient's decision.   1530 Patient has decided on [facility]. Shazma at [facility] aware and can admit patient tomorrow.  Expected Discharge Plan: [place]     Patient Goals and CMS Choice Patient states their goals for this hospitalization and ongoing recovery are:: to go to rehab CMS Medicare.gov Compare Post Acute Care list provided to:: Patient Choice offered to / list presented to : Patient  Expected Discharge Plan and Services Expected Discharge Plan: [place]   Discharge Planning Services: CM Consult Post Acute Care Choice: [place] Living arrangements for the past 2 months: Apartment                 DME Arranged: N/A         HH Arranged: NA          Prior Living Arrangements/Services Living arrangements for the past 2 months: Apartment Lives with:: Parents Patient language and need for interpreter reviewed:: Yes Do you feel safe going back to the place where you live?: Yes      Need for Family Participation in Patient Care: Yes (Comment)   Current home services:  DME Criminal Activity/Legal Involvement Pertinent to Current Situation/Hospitalization: No - Comment as needed  Activities of Daily Living      Permission Sought/Granted   Permission granted to share information with : No              Emotional Assessment Appearance:: Appears stated age Attitude/Demeanor/Rapport: Engaged Affect (typically observed): Accepting, Happy Orientation: : Oriented to Self, Oriented to Place, Oriented to  Time, Oriented to Situation Alcohol / Substance Use: Not Applicable Psych Involvement: No (comment)  Admission diagnosis:  Lower urinary tract infectious disease [N39.0] Weakness [R53.1] Generalized weakness [R53.1] Patient Active Problem List   Diagnosis Date Noted  . Weakness 06/06/2020  . Hypothermia 05/31/2020  . 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 Acute on chronic heart failure ([place]) 05/25/2020  . Alcoholic cirrhosis of liver with ascites ([place])   . Portal hypertensive gastropathy ([place])   . Melena 10/02/2019  . Angiodysplasia of stomach with hemorrhage   . Gastrointestinal hemorrhage   . Atrial fibrillation ([place])   . Chronic pain syndrome   . Symptomatic anemia 08/13/2019  . Acute upper GI bleed 08/13/2019  . Avulsion of right patellar tendon   . Closed displaced fracture of phalanx of toe of right foot 10/06/2018  . 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 Patella fracture, extensor mechanism disruption R knee  09/29/2018  .  History of colon cancer 08/23/2018  . CAD (coronary artery disease) 08/23/2018  . DM (diabetes mellitus), type 2 ([place]) 08/23/2018  . Pyuria 08/23/2018   PCP:  Helane Rima, MD Pharmacy:   [facility], [place], [address] [place], [address] [place] [address] Phone: [phone] Fax: [id number] - [place], [place] - [address] AT [place] [address] [place] [address] Phone: [phone] Fax:  [phone]  [facility] [place], [place] [address] [place] [address] Phone: [phone] Fax: [phone]     Social Determinants of Health ([place]) Interventions    Readmission Risk Interventions Readmission Risk Prevention Plan 10/06/2019 10/06/2019  Transportation Screening - Complete  PCP or Specialist Appt within 3-5 Days Complete -  HRI or [place] - Complete  Social Work Consult for [place] Planning/Counseling - Complete  Palliative Care Screening - Not Applicable  Medication Review Press photographer) - Complete

## 2020-06-07 NOTE — NC FL2 (Signed)
[place] LEVEL OF CARE SCREENING TOOL     IDENTIFICATION  Patient Name: Brandon [name] Birthdate: [birth year] Sex: male Admission Date (Current Location): 06/06/2020  [facility] and [place] Number:  Herbalist and Address:  [facility], [place] [address] [place], [place] [address]      Provider Number: [id number]  Attending Physician Name and Address:  Axel Filler, *  Relative Name and Phone Number:       Current Level of Care: Hospital Recommended Level of Care: [place] Prior Approval Number:    Date Approved/Denied:   PASRR Number: [id number]  Discharge Plan: SNF    Current Diagnoses: Patient Active Problem List   Diagnosis Date Noted  . Weakness 06/06/2020  . Hypothermia 05/31/2020  . 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 Acute on chronic heart failure ([place]) 05/25/2020  . Alcoholic cirrhosis of liver with ascites ([place])   . Portal hypertensive gastropathy ([place])   . Melena 10/02/2019  . Angiodysplasia of stomach with hemorrhage   . Gastrointestinal hemorrhage   . Atrial fibrillation ([place])   . Chronic pain syndrome   . Symptomatic anemia 08/13/2019  . Acute upper GI bleed 08/13/2019  . Avulsion of right patellar tendon   . Closed displaced fracture of phalanx of toe of right foot 10/06/2018  . 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 Patella fracture, extensor mechanism disruption R knee  09/29/2018  . History of colon cancer 08/23/2018  . CAD (coronary artery disease) 08/23/2018  . DM (diabetes mellitus), type 2 ([place]) 08/23/2018  . Pyuria 08/23/2018    Orientation RESPIRATION BLADDER Height & Weight     Self, Time, Situation, Place  Normal Continent Weight: 133.8 kg Height:  5\' 6"  (167.6 cm)  BEHAVIORAL SYMPTOMS/MOOD NEUROLOGICAL BOWEL NUTRITION STATUS      Continent Diet  AMBULATORY STATUS COMMUNICATION OF NEEDS Skin   Extensive Assist Verbally Other (Comment), Bruising (Bullous pemphigoid last  day of  prednisone 06/09/20 rash on arm, back, perineum , buttocks)                       Personal Care Assistance Level of Assistance  Bathing, Dressing Bathing Assistance: Maximum assistance   Dressing Assistance: Maximum assistance     Functional Limitations Info             SPECIAL CARE FACTORS FREQUENCY  PT (By licensed PT), OT (By licensed OT)     PT Frequency: five times a week OT Frequency: five times a week            Contractures Contractures Info: Not present    Additional Factors Info  Code Status, Allergies Code Status Info: DNR Allergies Info: no known allergies           Current Medications (06/07/2020):  This is the current hospital active medication list Current Facility-Administered Medications  Medication Dose Route Frequency Provider Last Rate Last Admin  . carvedilol (COREG) tablet 6.25 mg  6.25 mg Oral BID [name], MD   6.25 mg at 06/07/20 0840  . clobetasol cream (TEMOVATE) 0.05 %   Topical BID [name], MD   Given at 06/07/20 0841  . finasteride (PROSCAR) tablet 5 mg  5 mg Oral Daily Aslam, Sadia, MD   5 mg at 06/07/20 0840  . insulin aspart (novoLOG) injection 0-15 Units  0-15 Units Subcutaneous TID WC [name], MD   3 Units at 06/07/20 1122  . nystatin (MYCOSTATIN/NYSTOP) topical  powder   Topical BID [name], MD   Given at 06/07/20 [phone]  . pantoprazole (PROTONIX) EC tablet 40 mg  40 mg Oral Daily Aslam, Loralyn [place], MD   40 mg at 06/07/20 0841  . pravastatin (PRAVACHOL) tablet 10 mg  10 mg Oral QHS [name], MD   10 mg at 06/06/20 2202  . predniSONE (DELTASONE) tablet 20 mg  20 mg Oral Q breakfast Aslam, Sadia, MD   20 mg at 06/06/20 2209  . rivaroxaban (XARELTO) tablet 10 mg  10 mg Oral Daily [name], MD   10 mg at 06/07/20 0841  . sodium zirconium cyclosilicate (LOKELMA) packet 5 g  5 g Oral BID Sanjuan Dame, MD   5 g at 06/07/20 1037  . tamsulosin (FLOMAX) capsule 0.4 mg  0.4 mg Oral QHS [name], MD    0.4 mg at 06/06/20 2202     Discharge Medications: Please see discharge summary for a list of discharge medications.  Relevant Imaging Results:  Relevant Lab Results:   Additional Information SSI [phone] , Sliding scale insulin novolog 0 to 15 units TID, Xarelto 10 mg daily, Diabetes mellitus  Timika Muench, Edson Snowball, RN

## 2020-06-07 NOTE — Care Management Obs Status (Signed)
[place] NOTIFICATION   Patient Details  Name: Brandon [name] MRN: [id number] Date of Birth: 1945-12-26   Medicare Observation Status Notification Given:  Yes    Marilu Favre, RN 06/07/2020, 11:48 AM

## 2020-06-07 NOTE — Progress Notes (Signed)
Subjective: HD #1 No acute overnight events. Patient evaluated at bedside this AM. Mentions he's doing okay this morning, just frustrated with current health state and strength. Discussed physical therapy with patient and need for more intensive therapies. Rash continues to improve. Encouraged patient to think about rehab facility given his deconditioned state. Patient says he will think about it, needs to discuss with family member/ mother.  Objective:  Vital signs in last 24 hours: Vitals:   06/06/20 1700 06/06/20 2137 06/07/20 0601 06/07/20 1221  BP: (!) 159/98 (!) 145/102 130/87 130/81  Pulse: 72 79 85 70  Resp: 16 16 17 18   Temp: 97.6 F (36.4 C) 97.8 F (36.6 C) 97.7 F (36.5 C) 97.6 F (36.4 C)  TempSrc: Oral Oral Oral Oral  SpO2: 99% 100% 97% 95%  Weight:      Height:       General: Obese elderly male, lying comfortably  in bed . No acute distress. Cardiovascular: Normal rate, irregular rhythm, normal S1 and S2 present Skin: Warm and dry. Diffuse erythematous rash across all extremities and abdomen. Multiple scabs noted; No active vesicles or bullae. Much improved overall. Neurological: Is alert and oriented x4 Psychiatric: Normal mood and affect. Behavior is normal. Judgment and thought content normal.   Assessment/Plan: Brandon [name] is 74yo person with bullous pemphigoid, acute on chronic heart failure, atrial fibrillation and recent hospitalization at [facility] 10/29-11/8 for heart failure exacerbation and bullous pemphigoid admitted 11/10 for generalized weakness with concern for deconditiong.  Principal Problem:   Physical deconditioning Active Problems:   DM (diabetes mellitus), type 2 (HCC)   Atrial fibrillation (HCC)   Bullous pemphigoid   Chronic heart failure with preserved ejection fraction (HFpEF) (HCC)   Hyperkalemia  #Generalized weakness #Denconditioning Patient reports feeling week when he took a walk in ED yesterday. He has not tried walking here in  hospital but plans to work with PT today.UPatient reported worsening weakness until he could not stand from toilet and lowered himself from toilet, reports no fall or hitting head. No symptoms concerning for syncope or pre-syncopal episode. No fevers, diarrhea, shortness of breath. Appears euvolemic on exam. Likely generalized weakness 2/2 deconditioning.  During last hospitalization patient declined rehabilitation facility. Will have PT/OT evaluate and discuss options with patient.  - PT/OT evaluation  #Asymptomatic UTI He denies urinary symptoms today. Complains of incomplete emptying of bladder.Urine culture came positive for ENTEROCOCCUS FAECALIS. Denies urinary symptoms prior to arrival, although said some intermittent burning with urination yesterday.UA with pyuria, although improved from UA during last admission. Received 1 dose Rocephin in ED. Will continue to monitor for urinary symptoms. Can re-start abx if continues to have symptoms.  - Monitor urinary symptoms - Follow Post void residual  # Hyperkalemia His potassium this morning is 6.1. He had elevated potassium levels~ 5.4 on his last admission as well.  - Lokelma 5 g BID  #Chronic diastolic heart failure TTE last admission w/ EF 60-65%, mild AR. Patient on arrival denies shortness of breath, chest pain, palpitations. Appears euvolemic on exam. Will continue home medications and continue to monitor volume status. - C/w home carvedilol - D/c spironolactone - CBC, CMP monitoring  #Atrial Fibrillation Heart rate normal on arrival with irregular rhythm. CHADSVASC2 4, HASBLED 4. Patient has hx of GI bleed when previously on anticoagulation w/ DAPT. Will continue home rate control. - C/w home carvedilol - Tele  #Bullous Pemphigoid His rash looks overall improved, mild itching. Patient had rash during last admission. Obtained punch biopsy,  confirmed diagnosis of bullous pemphigoid. He was started on systemic steroids and sent  home with a taper for total of 12 days. Patient on arrival reports improved rash and symptoms. Will need three more days   - Taper: prednisone 20mg  today - Will need two more days 10mg  afterward   #Diabetes Mellitus Recent A1c 6.9. Glucose 188 on arrival. Will continue with SSI. Need close monitoring given systemic steroids. - CBG monitoring TID WC, QHS - SSI Prior to Admission Living Arrangement: Home Anticipated Discharge Location: Home vs SNF Barriers to Discharge: Indecisive about disposition Dispo: Anticipated discharge in approximately 2-3 day(s).   Honor Junes, MD 06/07/2020, 2:44 PM Pager: [phone] After 5pm on weekdays and 1pm on weekends: On Call pager [phone]

## 2020-06-07 NOTE — Evaluation (Signed)
Physical Therapy Evaluation Patient Details Name: Brandon [name] MRN: [id number] DOB: 05/21/1946 Today's Date: 06/07/2020   History of Present Illness  Pt is a 74 y/o male admitted 2 days after discharge with generalized weakness. Previous admission was from 10/29-11/8 secondary to CHF exacerbation and DRESS syndrome. Pt refused further rehab in SNF at that time. PMH includes bullous pemphigoid, DM, a fib, CHF, alcoholic cirrhosis, peripheral neuropathy and colon cancer.  Clinical Impression  Pt admitted with above diagnosis. Pt with recent hospitalization and return home.  Upon return home was still weak and having falls so returned to hospital.  Today pt required min guard- min A of 2 (assist of 2 for safety due to obesity and hx of falls) for transfers and short distance ambulation.  Pt resides at home with his elderly mother.  Pt would benefit from further rehab prior to return home due to fall risk, weakness, and limited support at home. Pt currently with functional limitations due to the deficits listed below (see PT Problem List). Pt will benefit from skilled PT to increase their independence and safety with mobility to allow discharge to the venue listed below.       Follow Up Recommendations SNF    Equipment Recommendations  None recommended by PT    Recommendations for Other Services       Precautions / Restrictions Precautions Precautions: Fall Restrictions Weight Bearing Restrictions: No      Mobility  Bed Mobility Overal bed mobility: Needs Assistance Bed Mobility: Supine to Sit;Sit to Supine     Supine to sit: Supervision;HOB elevated Sit to supine: Supervision;HOB elevated   General bed mobility comments: pt typically sleeps in a recliner, pt raised HOB up maximally, used rail and momentum for supine to sit at EOB    Transfers Overall transfer level: Needs assistance Equipment used: Rolling walker (2 wheeled) Transfers: Sit to/from Stand Sit to Stand: +2  safety/equipment;Min assist         General transfer comment: elevated bed, tends to pull up on walker - assist to stabilize RW, increased time  Ambulation/Gait Ambulation/Gait assistance: Min assist;+2 safety/equipment Gait Distance (Feet): 3 Feet Assistive device: Rolling walker (2 wheeled) Gait Pattern/deviations: Step-to pattern;Shuffle;Decreased stride length Gait velocity: decreased   General Gait Details: A few steps at EOB; fatigued easily ; cues for RW  Stairs            Wheelchair Mobility    Modified Rankin (Stroke Patients Only)       Balance Overall balance assessment: Needs assistance Sitting-balance support: No upper extremity supported;Feet supported Sitting balance-Leahy Scale: Good     Standing balance support: Bilateral upper extremity supported;During functional activity Standing balance-Leahy Scale: Poor Standing balance comment: Reliant on BUE support                              Pertinent Vitals/Pain Pain Assessment: Faces Faces Pain Scale: Hurts a little bit Pain Location: R foot with touch Pain Descriptors / Indicators: Guarding Pain Intervention(s): Monitored during session    Home Living Family/patient expects to be discharged to:: Private residence Living Arrangements: Parent (52 yo mother) Available Help at Discharge: Family;Available 24 hours/day Type of Home: Apartment Home Access: Level entry     Home Layout: One level Home Equipment: Walker - 2 wheels;Tub bench;Grab bars - tub/shower;Grab bars - toilet;Toilet riser Additional Comments: Pt. has food and medicine delivered.    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; ROM WFL some deviations due to arthritis; MMT 4/5 throughout LLE Deficits / Details: ROM WFL; MMT ankle and knee 5/5, hip 4/5    Cervical / Trunk Assessment Cervical / Trunk Assessment: Other exceptions;Kyphotic Cervical / Trunk Exceptions: hx of chronic back pain, obesity  Communication   Communication: No difficulties  Cognition Arousal/Alertness: Awake/alert Behavior During Therapy: WFL for tasks assessed/performed Overall Cognitive Status: Within Functional Limits for tasks assessed                                        General Comments      Exercises     Assessment/Plan    PT Assessment Patient needs continued PT services  PT Problem List Decreased strength;Decreased balance;Decreased mobility;Decreased activity tolerance;Decreased knowledge of use of DME;Pain;Decreased range of motion       PT Treatment Interventions Gait training;DME instruction;Therapeutic activities;Functional mobility training;Balance training;Therapeutic exercise;Patient/family education    PT Goals (Current goals can be found in the Care Plan section)  Acute Rehab PT Goals Patient Stated Goal: agreeable to rehab prior to return home PT Goal Formulation: With patient Time For Goal Achievement: 06/21/20 Potential to Achieve Goals: Good    Frequency Min 3X/week   Barriers to discharge Decreased caregiver support      Co-evaluation PT/OT/SLP Co-Evaluation/Treatment: Yes Reason for Co-Treatment: Complexity of the patient's impairments (multi-system involvement);For patient/therapist safety PT goals addressed during session: Mobility/safety with mobility OT goals addressed  during session: ADL's and self-care       AM-PAC PT "6 Clicks" Mobility  Outcome Measure Help needed turning from your back to your side while in a flat bed without using bedrails?: A Little Help needed moving from lying on your back to sitting on the side of a flat bed without using bedrails?: A Little Help needed moving to and from a bed to a chair (including a wheelchair)?: A Little Help needed standing up from a chair using your arms (e.g., wheelchair or bedside chair)?: A Little Help needed to walk in hospital room?: A Little Help needed climbing 3-5 steps with a railing? : A Lot 6 Click Score: 17    End of Session   Activity Tolerance: Patient tolerated treatment well Patient left: in bed;with call bell/phone within reach Nurse Communication: Mobility status (needs taller [facility]) PT Visit Diagnosis: Unsteadiness on feet (R26.81);Muscle weakness (generalized) (M62.81)    Time: 1012-1050 PT Time Calculation (min) (ACUTE ONLY): 38 min   Charges:   PT Evaluation $PT Eval Moderate Complexity: 1 Melina Schools, PT Acute Rehab Services Pager [phone] [facility] [phone]    Karlton Lemon 06/07/2020, 12:02 PM

## 2020-06-07 NOTE — Evaluation (Signed)
Occupational Therapy Evaluation Patient Details Name: Brandon [name] MRN: [id number] DOB: 08-07-1945 Today's Date: 06/07/2020    History of Present Illness Pt is a 74 y/o male admitted 2 days after discharge with generalized weakness. Previous admission was from 10/29-11/8 secondary to CHF exacerbation and DRESS syndrome. Pt refused further rehab in SNF. PMH includes bullous pemphigoid, DM, a fib, CHF, alcoholic cirrhosis, peripheral neuropathy and colon cancer.    Clinical Impression    Pt walks with a RW and reports being able to sponge bathe and dress himself. He avoids socks and wears sandals typically. Pt lives with his elderly mother and they take care of each other. Pt with hx of multiple falls and difficulty with fine motor tasks due to neuropathy. Pt presents with generalized weakness and impaired standing balance. He requires set up to total assist for ADL. Pt with decreased activity tolerance. Recommending rehab in SNF prior to pt returning home. He is in agreement.          Follow Up Recommendations  SNF;Supervision/Assistance - 24 hour    Equipment Recommendations  None recommended by OT    Recommendations for Other Services       Precautions / Restrictions Precautions Precautions: Fall Restrictions Weight Bearing Restrictions: No      Mobility Bed Mobility Overal bed mobility: Needs Assistance Bed Mobility: Supine to Sit     Supine to sit: Supervision;HOB elevated     General bed mobility comments: pt typically sleeps in a recliner, pt raised HOB up maximally, used rail and momentum for supine to sit at EOB    Transfers Overall transfer level: Needs assistance Equipment used: Rolling walker (2 wheeled) Transfers: Sit to/from Stand Sit to Stand: Min guard;+2 safety/equipment         General transfer comment: elevated bed, tends to pull up on walker, increased time     Balance Overall balance assessment: Needs assistance   Sitting balance-Leahy  Scale: Good     Standing balance support: Bilateral upper extremity supported;During functional activity Standing balance-Leahy Scale: Poor Standing balance comment: Reliant on BUE support                            ADL either performed or assessed with clinical judgement   ADL Overall ADL's : Needs assistance/impaired Eating/Feeding: Independent   Grooming: Wash/dry hands;Wash/dry face;Sitting;Supervision/safety   Upper Body Bathing: Moderate assistance;Sitting   Lower Body Bathing: Total assistance;Sit to/from stand   Upper Body Dressing : Minimal assistance;Sitting   Lower Body Dressing: Total assistance;Bed level       Toileting- Clothing Manipulation and Hygiene: Total assistance;+2 for safety/equipment;Sit to/from stand         General ADL Comments: wears sandals only on feet     Vision Baseline Vision/History: Wears glasses Wears Glasses: Reading only Patient Visual Report: No change from baseline       Perception     Praxis      Pertinent Vitals/Pain Pain Assessment: Faces Faces Pain Scale: Hurts a little bit Pain Location: R foot with touch Pain Descriptors / Indicators: Guarding Pain Intervention(s): Monitored during session     Hand Dominance Right   Extremity/Trunk Assessment Upper Extremity Assessment Upper Extremity Assessment: RUE deficits/detail;LUE deficits/detail RUE Deficits / Details: reports baseline limitations in R shoulder, but able to reach up to headboard to pull himself up in bed RUE Sensation: history of peripheral neuropathy RUE Coordination: decreased fine motor LUE Sensation: history of peripheral neuropathy LUE Coordination:  decreased fine motor   Lower Extremity Assessment Lower Extremity Assessment: Defer to PT evaluation   Cervical / Trunk Assessment Cervical / Trunk Assessment: Other exceptions;Kyphotic Cervical / Trunk Exceptions: hx of chronic back pain, obesity   Communication  Communication Communication: No difficulties   Cognition Arousal/Alertness: Awake/alert Behavior During Therapy: WFL for tasks assessed/performed Overall Cognitive Status: Within Functional Limits for tasks assessed                                     General Comments       Exercises     Shoulder Instructions      Home Living Family/patient expects to be discharged to:: Private residence Living Arrangements: Parent (mother) Available Help at Discharge: Family;Available 24 hours/day Type of Home: Apartment Home Access: Level entry     Home Layout: One level     Bathroom Shower/Tub: Teacher, early years/pre: Handicapped height Bathroom Accessibility: Yes How Accessible: Accessible via walker Home Equipment: [place] - 2 wheels;Tub bench;Grab bars - tub/shower;Grab bars - toilet;Toilet riser   Additional Comments: Pt. has food and medicine delivered.      Prior Functioning/Environment Level of Independence: Needs assistance  Gait / Transfers Assistance Needed: Reports uses RW but having difficulty and frequent falls ADL's / Homemaking Assistance Needed: Pt does sponge baths but reports independent with ADLs   Comments: sleeps in recliner        OT Problem List: Decreased activity tolerance;Decreased knowledge of use of DME or AE      OT Treatment/Interventions: Self-care/ADL training;DME and/or AE instruction;Therapeutic activities;Patient/family education    OT Goals(Current goals can be found in the care plan section) Acute Rehab OT Goals Patient Stated Goal: agreeable to rehab prior to return home OT Goal Formulation: With patient Time For Goal Achievement: 06/21/20 Potential to Achieve Goals: Good ADL Goals Pt Will Perform Grooming: with supervision;standing Pt Will Perform Lower Body Bathing: with supervision;with adaptive equipment;sit to/from stand Pt Will Perform Lower Body Dressing: with supervision;with adaptive equipment;sit  to/from stand Pt Will Transfer to Toilet: with supervision;ambulating;bedside commode (over toilet) Pt Will Perform Toileting - Clothing Manipulation and hygiene: with supervision;sit to/from stand  OT Frequency: Min 2X/week   Barriers to D/C: Decreased caregiver support          Co-evaluation PT/OT/SLP Co-Evaluation/Treatment: Yes     OT goals addressed during session: ADL's and self-care      AM-PAC OT "6 Clicks" Daily Activity     Outcome Measure Help from another person eating meals?: None Help from another person taking care of personal grooming?: A Little Help from another person toileting, which includes using toliet, bedpan, or urinal?: Total Help from another person bathing (including washing, rinsing, drying)?: A Lot Help from another person to put on and taking off regular upper body clothing?: A Little Help from another person to put on and taking off regular lower body clothing?: Total 6 Click Score: 14   End of Session Equipment Utilized During Treatment: Rolling walker  Activity Tolerance: Patient tolerated treatment well Patient left: in bed;with call bell/phone within reach  OT Visit Diagnosis: Unsteadiness on feet (R26.81);Other abnormalities of gait and mobility (R26.89);Pain;Muscle weakness (generalized) (M62.81)                Time: 6578-4696 OT Time Calculation (min): 28 min Charges:  OT General Charges $OT Visit: 1 Visit OT Evaluation $OT Eval Moderate Complexity: 1 Mod  Nestor Lewandowsky, OTR/L Acute Rehabilitation Services Pager: [phone] Office: [phone]  Malka So 06/07/2020, 11:52 AM

## 2020-06-08 ENCOUNTER — Other Ambulatory Visit (HOSPITAL_COMMUNITY): Payer: Self-pay | Admitting: Student in an Organized Health Care Education/Training Program

## 2020-06-08 LAB — GLUCOSE, CAPILLARY
Glucose-Capillary: 140 mg/dL — ABNORMAL HIGH (ref 70–99)
Glucose-Capillary: 125 mg/dL — ABNORMAL HIGH (ref 70–99)

## 2020-06-08 LAB — CBC
HCT: 38.1 % — ABNORMAL LOW (ref 39.0–52.0)
Hemoglobin: 12 g/dL — ABNORMAL LOW (ref 13.0–17.0)
MCH: 28.6 pg (ref 26.0–34.0)
MCHC: 31.5 g/dL (ref 30.0–36.0)
MCV: 90.7 fL (ref 80.0–100.0)
Platelets: 141 10*3/uL — ABNORMAL LOW (ref 150–400)
RBC: 4.2 MIL/uL — ABNORMAL LOW (ref 4.22–5.81)
RDW: 16.4 % — ABNORMAL HIGH (ref 11.5–15.5)
WBC: 11.9 10*3/uL — ABNORMAL HIGH (ref 4.0–10.5)
nRBC: 0 % (ref 0.0–0.2)

## 2020-06-08 LAB — BASIC METABOLIC PANEL
Anion gap: 8 (ref 5–15)
BUN: 32 mg/dL — ABNORMAL HIGH (ref 8–23)
CO2: 22 mmol/L (ref 22–32)
Calcium: 9 mg/dL (ref 8.9–10.3)
Chloride: 101 mmol/L (ref 98–111)
Creatinine, Ser: 1.08 mg/dL (ref 0.61–1.24)
GFR, Estimated: >60 mL/min (ref >60)
Glucose, Bld: 112 mg/dL — ABNORMAL HIGH (ref 70–99)
Potassium: 4.4 mmol/L (ref 3.5–5.1)
Sodium: 131 mmol/L — ABNORMAL LOW (ref 135–145)

## 2020-06-08 MED ORDER — TRIAMCINOLONE ACETONIDE 0.5 % EX OINT: 1.0000 "application " | TOPICAL_OINTMENT | Freq: Two times a day (BID) | CUTANEOUS | 0 refills | Status: DC

## 2020-06-08 MED ORDER — NYSTATIN 100000 UNIT/GM EX POWD: Freq: Two times a day (BID) | CUTANEOUS | 0 refills | Status: DC

## 2020-06-08 MED ORDER — PREDNISONE 5 MG PO TABS: 10.0000 mg | ORAL_TABLET | Freq: Every day | ORAL | 0 refills | Status: DC

## 2020-06-08 MED ORDER — TRIAMCINOLONE ACETONIDE 0.1 % EX CREA: 1.0000 "application " | TOPICAL_CREAM | Freq: Two times a day (BID) | CUTANEOUS | 0 refills | Status: DC

## 2020-06-08 MED FILL — NYSTATIN 100,000 UNIT/GM PO: 100000 | 4 days supply | Qty: 15 | Fill #0

## 2020-06-08 MED FILL — TRIAMCINOLONE 0.5% OINTMENT: 0.5 | 5 days supply | Qty: 30 | Fill #0

## 2020-06-08 MED FILL — predniSONE 5 MG TABS: 5 | 1 days supply | Qty: 2 | Fill #0

## 2020-06-08 NOTE — Progress Notes (Signed)
Subjective: HD #2 No acute overnight events. Patient evaluated at bedside this AM. No acute events overnight. Mentions he is having some increased itching and blisters on his hands. Discussed using steroid cream. Will discharge with triamcinolone cream. Patient worked with physical therapy, was able to sit up in chair. Says he was steady, but still some weakness. Discussed d/c to SNF today.  Objective:  Vital signs in last 24 hours: Vitals:   06/07/20 1221 06/07/20 1704 06/07/20 2209 06/08/20 0600  BP: 130/81 112/62 120/61 135/79  Pulse: 70 62 67 65  Resp: 18 18 18 18   Temp: 97.6 F (36.4 C) 99.1 F (37.3 C) 97.8 F (36.6 C) 98.5 F (36.9 C)  TempSrc: Oral Oral Oral Oral  SpO2: 95% 97%  96%  Weight:      Height:       CBC Latest Ref Rng & Units 06/08/2020 06/07/2020 06/06/2020  WBC 4.0 - 10.5 K/uL 11.9(H) 13.4(H) 13.1(H)  Hemoglobin 13.0 - 17.0 g/dL 12.0(L) 12.5(L) 13.3  Hematocrit 39 - 52 % 38.1(L) 40.3 43.0  Platelets 150 - 400 K/uL 141(L) 144(L) 158   BMP Latest Ref Rng & Units 06/08/2020 06/06/2020 06/06/2020  Glucose 70 - 99 mg/dL 112(H) 121(H) 188(H)  BUN 8 - 23 mg/dL 32(H) 25(H) 37(H)  Creatinine 0.61 - 1.24 mg/dL 1.08 0.79 1.05  Sodium 135 - 145 mmol/L 131(L) 134(L) 134(L)  Potassium 3.5 - 5.1 mmol/L 4.4 6.1(H) 4.8  Chloride 98 - 111 mmol/L 101 103 102  CO2 22 - 32 mmol/L 22 22 23   Calcium 8.9 - 10.3 mg/dL 9.0 9.1 9.3    General: Obese elderly male, lying comfortably in bed. No acute distress. Musculoskeletal: Normal bulk and tone. No peripheral edema noted.  Skin: Warm and dry. Diffuse erythematous rash across all extremities and abdomen. Multiple scabs noted; Few active vesicles on hands.  Neurological: Is alert and oriented x4 Psychiatric: Normal mood and affect. Behavior is normal. Judgment and thought content normal  Assessment/Plan: Mr. Zhong is 74yo person with bullous pemphigoid, acute on chronic heart failure, atrial fibrillation and recent  hospitalization at [facility] 10/29-11/8 for heart failure exacerbation and bullous pemphigoid admitted 11/10 for generalized weakness with concern for deconditiong.  Principal Problem:   Physical deconditioning Active Problems:   DM (diabetes mellitus), type 2 (HCC)   Atrial fibrillation (HCC)   Bullous pemphigoid   Chronic heart failure with preserved ejection fraction (HFpEF) (HCC)   Hyperkalemia   Weakness generalized  #Generalized weakness #Denconditioning Patient reports doing okay with PT yesterday. He felt weak but moved around and sat on chair. He presneted with worsening weakness when at home he could not stand from toilet and lowered himself from toilet, reports no fall or hitting head. No symptoms concerning for syncope or pre-syncopal episode. No fevers, diarrhea, shortness of breath. Appears euvolemic on exam. Likely generalized weakness 2/2 deconditioning.  During last hospitalization patient declined rehabilitation facility. PT/OT evaluated and suggested SNF.  - Discharge to SNF    #Asymptomatic Bacteruria He denies urinary symptoms today. Urine culture came positive for ENTEROCOCCUS FAECALIS. Denies urinary symptoms prior to arrival, although said some intermittent burning with urination 2 days ago. UA with pyuria, although improved from UA during last admission. Received 1 dose Rocephin in ED. Will continue to monitor for urinary symptoms.   - Monitor urinary symptoms - Follow Post void residual  # Hyperkalemia His potassium this morning is 4 from 6.1 yesterday. He had elevated potassium levels~ 5.4 on his last admission as well.  -  Lokelma 5 g BID  #Chronic diastolic heart failure TTE last admission w/ EF 60-65%, mild AR. Patient on arrival denies shortness of breath, chest pain, palpitations. Appears euvolemic on exam. Will continue home medications and continue to monitor volume status. - C/w home carvedilol - D/c spironolactone - CBC, CMP monitoring  #Atrial  Fibrillation Heart rate normal on arrival with irregular rhythm. CHADSVASC2 4, HASBLED 4. Patient has hx of GI bleed when previously on anticoagulation w/ DAPT. Will continue home rate control. - C/w home carvedilol - Tele  #Bullous Pemphigoid His rash looks overall improved, mild itching. Patient had rash during last admission. Obtained punch biopsy, confirmed diagnosis of bullous pemphigoid. He was started on systemic steroids and sent home with a taper for total of 12 days. Patient on arrival reports improved rash and symptoms. Will need three more days   - Taper: prednisone 10mg  today - Will need one 10 mg tomorrow.   #Diabetes Mellitus Recent A1c 6.9. Glucose 188 on arrival. Will continue with SSI. Need close monitoring given systemic steroids. - CBG monitoring TID WC, QHS - SSI  Prior to Admission Living Arrangement: Home Anticipated Discharge Location: SNF Barriers to Discharge: None Dispo: Anticipated dischargetoday   Liliya Fullenwider, Meredith Staggers, MD 06/08/2020, 11:29 AM Pager: [phone] After 5pm on weekdays and 1pm on weekends: On Call pager [phone]

## 2020-06-08 NOTE — Progress Notes (Signed)
RN called [facility] and gave report to [facility] and she stated understanding. IV has been removed and discharge paperwork and PTAR paperwork on chart waiting for DC

## 2020-06-08 NOTE — Hospital Course (Signed)
Patient evaluated at bedside this AM. No acute events overnight. Mentions he is having some increased itching and blisters on his hands. Discussed using steroid cream. Patient worked with physical therapy, was able to sit up in chair. Says he was steady, but still some weakness. Discussed d/c to SNF today.

## 2020-06-08 NOTE — Care Management (Addendum)
Received insurance authorization for SNF. Auth number: [id number] approved for 5 days starting 06/08/20 .   Provided authorization information to [facility]. Once discharge summary completed will it to [facility]. Donneta Romberg will review discharge summary and call NCM back with any questions and the number for bedside nurse to call report.   Last covid test was 06/06/20. Shazma aware and does not require another.   Sent message to Amy with [facility] regarding patient discharging to [facility] today.    [place] discharge summary . [facility] is ready for patient to transfer . Number to call report is [phone] .Secure chatted nurse with number and to see what time to arrange PTAR. Nurse ready PTAR called.  PTAR paperwork on chart.   Magdalen Spatz RN

## 2020-06-08 NOTE — Discharge Summary (Addendum)
Name: Brandon [name] MRN: [id number] DOB: 09-07-1945 74 y.o. PCP: Helane Rima, MD  Date of Admission: 06/06/2020  2:09 AM Date of Discharge: 06/08/2020 Attending Physician: Axel Filler, *  Discharge Diagnosis: 1. Physical Denconditioning 2. Asymptomatic Bactriuria  3. Hyperkalemia 4. Chronic diastolic heart failure with preserved ejection fraction (HFpEF) ([place]) 5. Atrial Fibrillation 6. Bullous Pemphigoid 7. Diabetes Mellitus   Discharge Medications: Allergies as of 06/08/2020   No Known Allergies     Medication List    STOP taking these medications   clobetasol cream 0.05 % Commonly known as: TEMOVATE   spironolactone 50 MG tablet Commonly known as: ALDACTONE     TAKE these medications   acetaminophen 500 MG tablet Commonly known as: TYLENOL Take 500 mg by mouth every 6 (six) hours as needed for moderate pain.   carvedilol 6.25 MG tablet Commonly known as: COREG Take 6.25 mg by mouth 2 (two) times daily.   finasteride 5 MG tablet Commonly known as: PROSCAR Take 1 tablet (5 mg total) by mouth daily.   hydrOXYzine 25 MG tablet Commonly known as: ATARAX/VISTARIL Take 1 tablet (25 mg total) by mouth daily.   metFORMIN 500 MG 24 hr tablet Commonly known as: GLUCOPHAGE-XR Take 500 mg by mouth daily with breakfast.   nystatin powder Commonly known as: MYCOSTATIN/NYSTOP Apply topically 2 (two) times daily for 7 days.   pantoprazole 40 MG tablet Commonly known as: PROTONIX Take 1 tablet (40 mg total) by mouth daily.   polyethylene glycol 17 g packet Commonly known as: MIRALAX / GLYCOLAX Take 17 g by mouth 2 (two) times daily. What changed:   when to take this  reasons to take this  additional instructions   pravastatin 10 MG tablet Commonly known as: PRAVACHOL Take 10 mg by mouth at bedtime.   predniSONE 5 MG tablet Commonly known as: DELTASONE Take 2 tablets (10 mg total) by mouth daily with breakfast for 1 day. Start taking on:  June 09, 2020 What changed:   medication strength  See the new instructions.   senna-docusate 8.6-50 MG tablet Commonly known as: Senokot-S Take 1 tablet by mouth 2 (two) times daily. What changed: when to take this   tamsulosin 0.4 MG Caps capsule Commonly known as: FLOMAX Take 0.4 mg by mouth at bedtime.      triamcinolone cream 0.1 % Commonly known as: KENALOG Apply 1 application topically 2 (two) times daily.       Disposition and follow-up:   Mr.Brandon [name] was discharged from [facility] in Stable condition.  At the hospital follow up visit please address:  1. Generalized weakness/Denconditioning: PT/OT suggested Skilled nursing facility 2. Asymptomatic Bactriuria: Please see your PCP if it becomes symptomatic. 3. Hyperkalemia: You were treated on admission and it trended down to normal. Please get BMP at your next PCP visit. 4. Chronic diastolic heart failure & Atrial Fibrillation: Please continue carvedilol. We discontinued Spironolactone due to high potassium. Please you Cardiology before resuming it.  5. Bullous Pemphigoid: Your last dose of prednisone is 10 mg tomorrow on 06/08/20. Start using Triamcinolone ointment and discontinue Clobetasol. Please call dermatology and make an appointment for further care.  6. Diabetes Mellitus: Continue Metformin and see PCP for further care.  2.  Labs / imaging needed at time of follow-up: BMP  3.  Pending labs/ test needing follow-up: None  Follow-up Appointments:  Contact information for follow-up providers    Loleta Chance, MD .   Specialty: Internal Medicine  Dermatology, [place]. Schedule an appointment as soon as possible for a visit in 7 day(s).   Contact information: [address] [place] [address] [phone]        Helane Rima, MD. Schedule an appointment as soon as possible for a visit in 7 day(s).   Specialty: Family Medicine Why: Please make  appointment, if needed. Contact information: [address] [place] [address] [phone]            Contact information for after-discharge care    [facility] .   Service: Skilled Nursing Contact information: [place] [facility] Course by problem list:  Generalized weakness/ Denconditioning Patient presented with weakness. No symptoms concerning for syncope or pre-syncopal episode. No fevers, diarrhea, shortness of breath. Appeared euvolemic on exam. Denied urinary symptoms prior to arrival, although says he has some intermittent burning with urination.UA with pyuria, although improved from UA during last admission. Received 1 dose Rocephin in ED. During last hospitalization patient declined rehabilitation facility. PT/OT evaluated him and recommended [place].  Asymptomatic Bactriuria  Denied urinary symptoms.Complained of incomplete emptying of bladder. Urine culture came positive for ENTEROCOCCUS FAECALIS. Denies urinary symptoms prior to arrival and day of discharge. Received 1 dose Rocephin in ED.   Hyperkalemia His potassium on admission was 6.1. He had elevated potassium levels~ 5.4 on his last admission as well. Spironolactone was discontinued and Lokelma 5 mg BID was started and on the day of discharge  It was 4.   Chronic diastolic heart failure TTE last admission w/ EF 60-65%, mild AR. On arrival denied shortness of breath, chest pain, palpitations. Appeared euvolemic on exam. Continue home medications and continued to monitor volume status.  Atrial Fibrillation Heart rate normal on arrival with irregular rhythm. CHADSVASC2 4, HASBLED 4. Patient has hx of GI bleed when previously on anticoagulation w/ DAPT. Continued  home rate control with carvedilol  Bullous Pemphigoid His rash looked overall improved, mild itching. Patient had rash during last  admission. Obtained punch biopsy, confirmed diagnosis of bullous pemphigoid. He was started on systemic steroids and sent home with a taper for total of 12 days. Patient on arrival reports improved rash and symptoms.   Diabetes Mellitus Recent A1c 6.9. Glucose 188 on arrival. Continued with SSI. Needed close monitoring given systemic steroids.   Discharge Vitals:   BP 135/79 (BP Location: Left Wrist)   Pulse 65   Temp 98.5 F (36.9 C) (Oral)   Resp 18   Ht 5\' 6"  (1.676 m)   Wt 295 lb (133.8 kg)   SpO2 96%   BMI 47.61 kg/m   Pertinent Labs, Studies, and Procedures:  CBC Latest Ref Rng & Units 06/08/2020 06/07/2020 06/06/2020  WBC 4.0 - 10.5 K/uL 11.9(H) 13.4(H) 13.1(H)  Hemoglobin 13.0 - 17.0 g/dL 12.0(L) 12.5(L) 13.3  Hematocrit 39 - 52 % 38.1(L) 40.3 43.0  Platelets 150 - 400 K/uL 141(L) 144(L) 158   BMP Latest Ref Rng & Units 06/08/2020 06/06/2020 06/06/2020  Glucose 70 - 99 mg/dL 112(H) 121(H) 188(H)  BUN 8 - 23 mg/dL 32(H) 25(H) 37(H)  Creatinine 0.61 - 1.24 mg/dL 1.08 0.79 1.05  Sodium 135 - 145 mmol/L 131(L) 134(L) 134(L)  Potassium 3.5 - 5.1 mmol/L 4.4 6.1(H) 4.8  Chloride 98 - 111 mmol/L 101 103 102  CO2 22 - 32 mmol/L  22 22 23   Calcium 8.9 - 10.3 mg/dL 9.0 9.1 9.3   Urinalysis    Component Value Date/Time   COLORURINE YELLOW 06/06/2020 0530   APPEARANCEUR CLOUDY (A) 06/06/2020 0530   LABSPEC 1.020 06/06/2020 0530   PHURINE 5.5 06/06/2020 0530   GLUCOSEU >=500 (A) 06/06/2020 0530   HGBUR MODERATE (A) 06/06/2020 0530   BILIRUBINUR NEGATIVE 06/06/2020 0530   KETONESUR NEGATIVE 06/06/2020 0530   PROTEINUR NEGATIVE 06/06/2020 0530   NITRITE NEGATIVE 06/06/2020 0530   LEUKOCYTESUR MODERATE (A) 06/06/2020 0530     Urine culture: positive for ENTEROCOCCUS FAECALIS  EKG:  Atrial fibrillation Ventricular premature complex Incomplete right bundle branch block Low voltage, precordial leads No significant change was found  Discharge Instructions: Discharge  Instructions    (HEART FAILURE PATIENTS) Call MD:  Anytime you have any of the following symptoms: 1) 3 pound weight gain in 24 hours or 5 pounds in 1 week 2) shortness of breath, with or without a dry hacking cough 3) swelling in the hands, feet or stomach 4) if you have to sleep on extra pillows at night in order to breathe.   Complete by: As directed    Call MD for:  difficulty breathing, headache or visual disturbances   Complete by: As directed    Call MD for:  extreme fatigue   Complete by: As directed    Call MD for:  hives   Complete by: As directed    Call MD for:  persistant dizziness or light-headedness   Complete by: As directed    Call MD for:  persistant nausea and vomiting   Complete by: As directed    Call MD for:  redness, tenderness, or signs of infection (pain, swelling, redness, odor or green/yellow discharge around incision site)   Complete by: As directed    Call MD for:  severe uncontrolled pain   Complete by: As directed    Call MD for:  temperature >100.4   Complete by: As directed    Diet - low sodium heart healthy   Complete by: As directed    Discharge instructions   Complete by: As directed    Mr. Strahm- You presented with Generalized weakness/ Deconditioning. There were no symptoms concerning for syncope or pre-syncopal episode. No fevers, diarrhea, shortness of breath. You appeared euvolemic on exam. PT/OT evaluated you and suggested Skilled Nursing facility. You also presented with asymptomatic Urinary Tract Infection but denied any active symptoms. Urine Analysis showed pyuria, although improved from UA during last admission.Urine culture came positive for ENTEROCOCCUS FAECALIS.  You received 1 dose Rocephin in ED.  Your potassium level was elevated to 6.1. Your medication spironolactone was discontinued and it was treated with Lokelma and lead to improvement and potassium is 4 on discharge.   Increase activity slowly   Complete by: As directed        Signed: Honor Junes, MD 06/08/2020, 11:25 AM   Pager: [phone]

## 2020-06-16 LAB — SUSCEPTIBILITY, AER + ANAEROB

## 2020-06-16 LAB — SUSCEPTIBILITY RESULT

## 2020-06-19 LAB — URINE CULTURE: Culture: >=100000 — AB

## 2020-07-23 ENCOUNTER — Other Ambulatory Visit: Payer: Self-pay | Admitting: Physician Assistant

## 2020-07-23 NOTE — Telephone Encounter (Signed)
Dr Rhea Belton-  We are getting a refill request for patient's spironolactone (last refilled by Mike Gip, PA-C). However, it appears this medication was discontinued by hospitalist in 05/2020 hospital visit due to hyperkalemia:  "Hyperkalemia His potassium on admission was 6.1. He had elevated potassium levels~ 5.4 on his last admission as well. Spironolactone was discontinued and Lokelma 5 mg BID was started and on the day of discharge  It was 4. "  It also appears he may have had home health consult via [facility] recently but we are unable to see those records, so the "full picture" is not visible.  The patient is overdue for office visit but I just wanted your advise on what to do in the meantime in this cirrhotic patient.  Thanks.

## 2020-07-23 NOTE — Telephone Encounter (Signed)
Left message for patient to call back  

## 2020-07-23 NOTE — Telephone Encounter (Signed)
If discontinued in hospital I would not refill and schedule an OV He can also ask his PCP in the meantime in period before OV with [place]

## 2020-07-24 NOTE — Telephone Encounter (Signed)
Left message for patient to call back  

## 2020-07-25 NOTE — Telephone Encounter (Signed)
I have again left message for patient to call back. I will send him a letter since I have gotten no response. He needs an office visit.

## 2020-11-01 ENCOUNTER — Telehealth: Payer: Self-pay

## 2020-11-01 ENCOUNTER — Other Ambulatory Visit: Payer: Self-pay

## 2020-11-01 DIAGNOSIS — K7460 Unspecified cirrhosis of liver: Secondary | ICD-10-CM

## 2020-11-01 NOTE — Telephone Encounter (Signed)
Order in epic for repeat MR of abd w/wo, message sent to rad scheduling to schedule appt.

## 2020-11-01 NOTE — Telephone Encounter (Signed)
-----   Message from Algernon Huxley, RN sent at 10/31/2019  9:24 AM EDT ----- Regarding: MR repeat MRI abdomen with and without contrast, HCC protocol, in 12 months

## 2020-11-06 ENCOUNTER — Telehealth: Payer: Self-pay

## 2020-11-06 NOTE — Telephone Encounter (Signed)
-----   Message from April H Pait sent at 11/05/2020  8:06 AM EDT ----- Regarding: FW: MRI of abd w/wo  ----- Message ----- From: Valli Glance Sent: 11/02/2020   4:50 PM EDT To: April H Pait, Roosvelt Maser Subject: RE: MRI of abd w/wo                            Pt refused to Sch test at this time,  ----- Message ----- From: Sheldon Silvan, April H Sent: 11/01/2020  10:14 AM EDT To: Nickie Retort Subject: FW: MRI of abd w/wo                             ----- Message ----- From: Algernon Huxley, RN Sent: 11/01/2020   9:52 AM EDT To: April H Pait, Darl Pikes Subject: MRI of abd w/wo                                Pt needs MR of abd w/wo scheduled, order in epic.

## 2020-11-06 NOTE — Telephone Encounter (Signed)
-----   Message from April H Pait sent at 11/06/2020 12:02 PM EDT ----- Regarding: RE: MRI of abd w/wo Pt doesn't want to have this mri. ----- Message ----- From: Algernon Huxley, RN Sent: 11/01/2020   9:52 AM EDT To: April H Pait, Darl Pikes Subject: MRI of abd w/wo                                Pt needs MR of abd w/wo scheduled, order in epic.

## 2020-11-06 NOTE — Telephone Encounter (Signed)
Noted  

## 2021-02-03 ENCOUNTER — Emergency Department (HOSPITAL_COMMUNITY): Payer: Medicare Other

## 2021-02-03 ENCOUNTER — Inpatient Hospital Stay (HOSPITAL_COMMUNITY)
Admission: EM | Admit: 2021-02-03 | Discharge: 2021-02-11 | DRG: 480 | Disposition: A | Discharge status: Skilled Nursing Facility | Payer: Medicare Other | Attending: Internal Medicine | Admitting: Internal Medicine

## 2021-02-03 DIAGNOSIS — Z8249 Family history of ischemic heart disease and other diseases of the circulatory system: Secondary | ICD-10-CM

## 2021-02-03 DIAGNOSIS — R338 Other retention of urine: Secondary | ICD-10-CM | POA: Diagnosis not present

## 2021-02-03 DIAGNOSIS — Z951 Presence of aortocoronary bypass graft: Secondary | ICD-10-CM

## 2021-02-03 DIAGNOSIS — M84351A Stress fracture, right femur, initial encounter for fracture: Secondary | ICD-10-CM

## 2021-02-03 DIAGNOSIS — I5033 Acute on chronic diastolic (congestive) heart failure: Secondary | ICD-10-CM | POA: Diagnosis not present

## 2021-02-03 DIAGNOSIS — S72451A Displaced supracondylar fracture without intracondylar extension of lower end of right femur, initial encounter for closed fracture: Secondary | ICD-10-CM

## 2021-02-03 DIAGNOSIS — W010XXA Fall on same level from slipping, tripping and stumbling without subsequent striking against object, initial encounter: Secondary | ICD-10-CM | POA: Diagnosis present

## 2021-02-03 DIAGNOSIS — Z6841 Body Mass Index (BMI) 40.0 and over, adult: Secondary | ICD-10-CM

## 2021-02-03 DIAGNOSIS — Z7984 Long term (current) use of oral hypoglycemic drugs: Secondary | ICD-10-CM

## 2021-02-03 DIAGNOSIS — Z419 Encounter for procedure for purposes other than remedying health state, unspecified: Secondary | ICD-10-CM

## 2021-02-03 DIAGNOSIS — E119 Type 2 diabetes mellitus without complications: Secondary | ICD-10-CM

## 2021-02-03 DIAGNOSIS — Z85038 Personal history of other malignant neoplasm of large intestine: Secondary | ICD-10-CM

## 2021-02-03 DIAGNOSIS — K766 Portal hypertension: Secondary | ICD-10-CM | POA: Diagnosis present

## 2021-02-03 DIAGNOSIS — M79604 Pain in right leg: Secondary | ICD-10-CM | POA: Diagnosis not present

## 2021-02-03 DIAGNOSIS — K7460 Unspecified cirrhosis of liver: Secondary | ICD-10-CM | POA: Diagnosis not present

## 2021-02-03 DIAGNOSIS — N401 Enlarged prostate with lower urinary tract symptoms: Secondary | ICD-10-CM | POA: Diagnosis present

## 2021-02-03 DIAGNOSIS — I4891 Unspecified atrial fibrillation: Secondary | ICD-10-CM | POA: Diagnosis present

## 2021-02-03 DIAGNOSIS — N179 Acute kidney failure, unspecified: Secondary | ICD-10-CM

## 2021-02-03 DIAGNOSIS — Z66 Do not resuscitate: Secondary | ICD-10-CM | POA: Diagnosis present

## 2021-02-03 DIAGNOSIS — Z79899 Other long term (current) drug therapy: Secondary | ICD-10-CM

## 2021-02-03 DIAGNOSIS — Z87891 Personal history of nicotine dependence: Secondary | ICD-10-CM

## 2021-02-03 DIAGNOSIS — K760 Fatty (change of) liver, not elsewhere classified: Secondary | ICD-10-CM | POA: Diagnosis present

## 2021-02-03 DIAGNOSIS — D696 Thrombocytopenia, unspecified: Secondary | ICD-10-CM | POA: Diagnosis present

## 2021-02-03 DIAGNOSIS — W19XXXA Unspecified fall, initial encounter: Secondary | ICD-10-CM

## 2021-02-03 DIAGNOSIS — Z9049 Acquired absence of other specified parts of digestive tract: Secondary | ICD-10-CM

## 2021-02-03 DIAGNOSIS — I10 Essential (primary) hypertension: Secondary | ICD-10-CM | POA: Diagnosis present

## 2021-02-03 DIAGNOSIS — I252 Old myocardial infarction: Secondary | ICD-10-CM | POA: Diagnosis not present

## 2021-02-03 DIAGNOSIS — K5790 Diverticulosis of intestine, part unspecified, without perforation or abscess without bleeding: Secondary | ICD-10-CM | POA: Diagnosis present

## 2021-02-03 DIAGNOSIS — Y92009 Unspecified place in unspecified non-institutional (private) residence as the place of occurrence of the external cause: Secondary | ICD-10-CM | POA: Diagnosis not present

## 2021-02-03 DIAGNOSIS — M25561 Pain in right knee: Secondary | ICD-10-CM | POA: Diagnosis not present

## 2021-02-03 DIAGNOSIS — I110 Hypertensive heart disease with heart failure: Secondary | ICD-10-CM | POA: Diagnosis present

## 2021-02-03 DIAGNOSIS — Z972 Presence of dental prosthetic device (complete) (partial): Secondary | ICD-10-CM

## 2021-02-03 DIAGNOSIS — S82001A Unspecified fracture of right patella, initial encounter for closed fracture: Secondary | ICD-10-CM | POA: Diagnosis present

## 2021-02-03 DIAGNOSIS — K7030 Alcoholic cirrhosis of liver without ascites: Secondary | ICD-10-CM | POA: Diagnosis present

## 2021-02-03 DIAGNOSIS — I2510 Atherosclerotic heart disease of native coronary artery without angina pectoris: Secondary | ICD-10-CM | POA: Diagnosis present

## 2021-02-03 DIAGNOSIS — Z20822 Contact with and (suspected) exposure to covid-19: Secondary | ICD-10-CM | POA: Diagnosis present

## 2021-02-03 DIAGNOSIS — T148XXA Other injury of unspecified body region, initial encounter: Secondary | ICD-10-CM

## 2021-02-03 DIAGNOSIS — D62 Acute posthemorrhagic anemia: Secondary | ICD-10-CM | POA: Diagnosis not present

## 2021-02-03 DIAGNOSIS — I480 Paroxysmal atrial fibrillation: Secondary | ICD-10-CM | POA: Diagnosis present

## 2021-02-03 DIAGNOSIS — Z7952 Long term (current) use of systemic steroids: Secondary | ICD-10-CM

## 2021-02-03 DIAGNOSIS — Z800 Family history of malignant neoplasm of digestive organs: Secondary | ICD-10-CM

## 2021-02-03 DIAGNOSIS — R0600 Dyspnea, unspecified: Secondary | ICD-10-CM

## 2021-02-03 DIAGNOSIS — Z7982 Long term (current) use of aspirin: Secondary | ICD-10-CM

## 2021-02-03 DIAGNOSIS — S7291XA Unspecified fracture of right femur, initial encounter for closed fracture: Secondary | ICD-10-CM | POA: Diagnosis present

## 2021-02-03 DIAGNOSIS — D649 Anemia, unspecified: Secondary | ICD-10-CM | POA: Diagnosis present

## 2021-02-03 IMAGING — DX DG KNEE 1-2V*R*
2 series · 2 of 2 positions shown · non-contrast
Comparison: Radiograph [DATE]

CLINICAL DATA: Pain after fall

EXAM:
RIGHT KNEE - 1-2 VIEW

[knee ap]
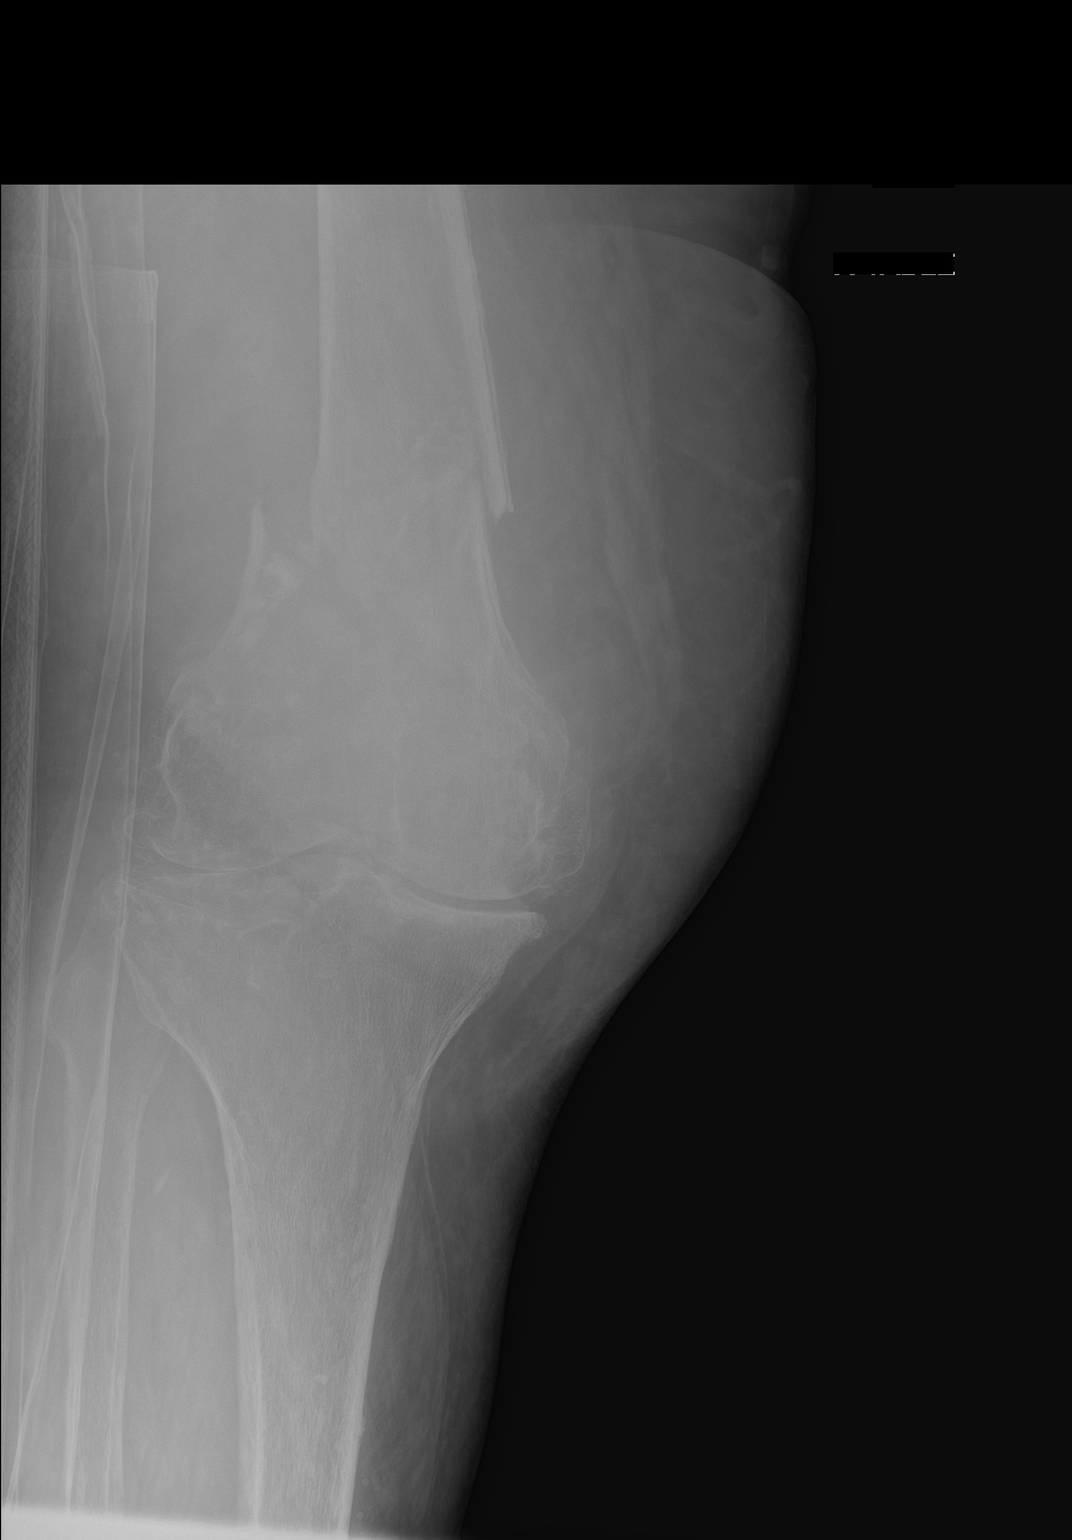

[knee lat]
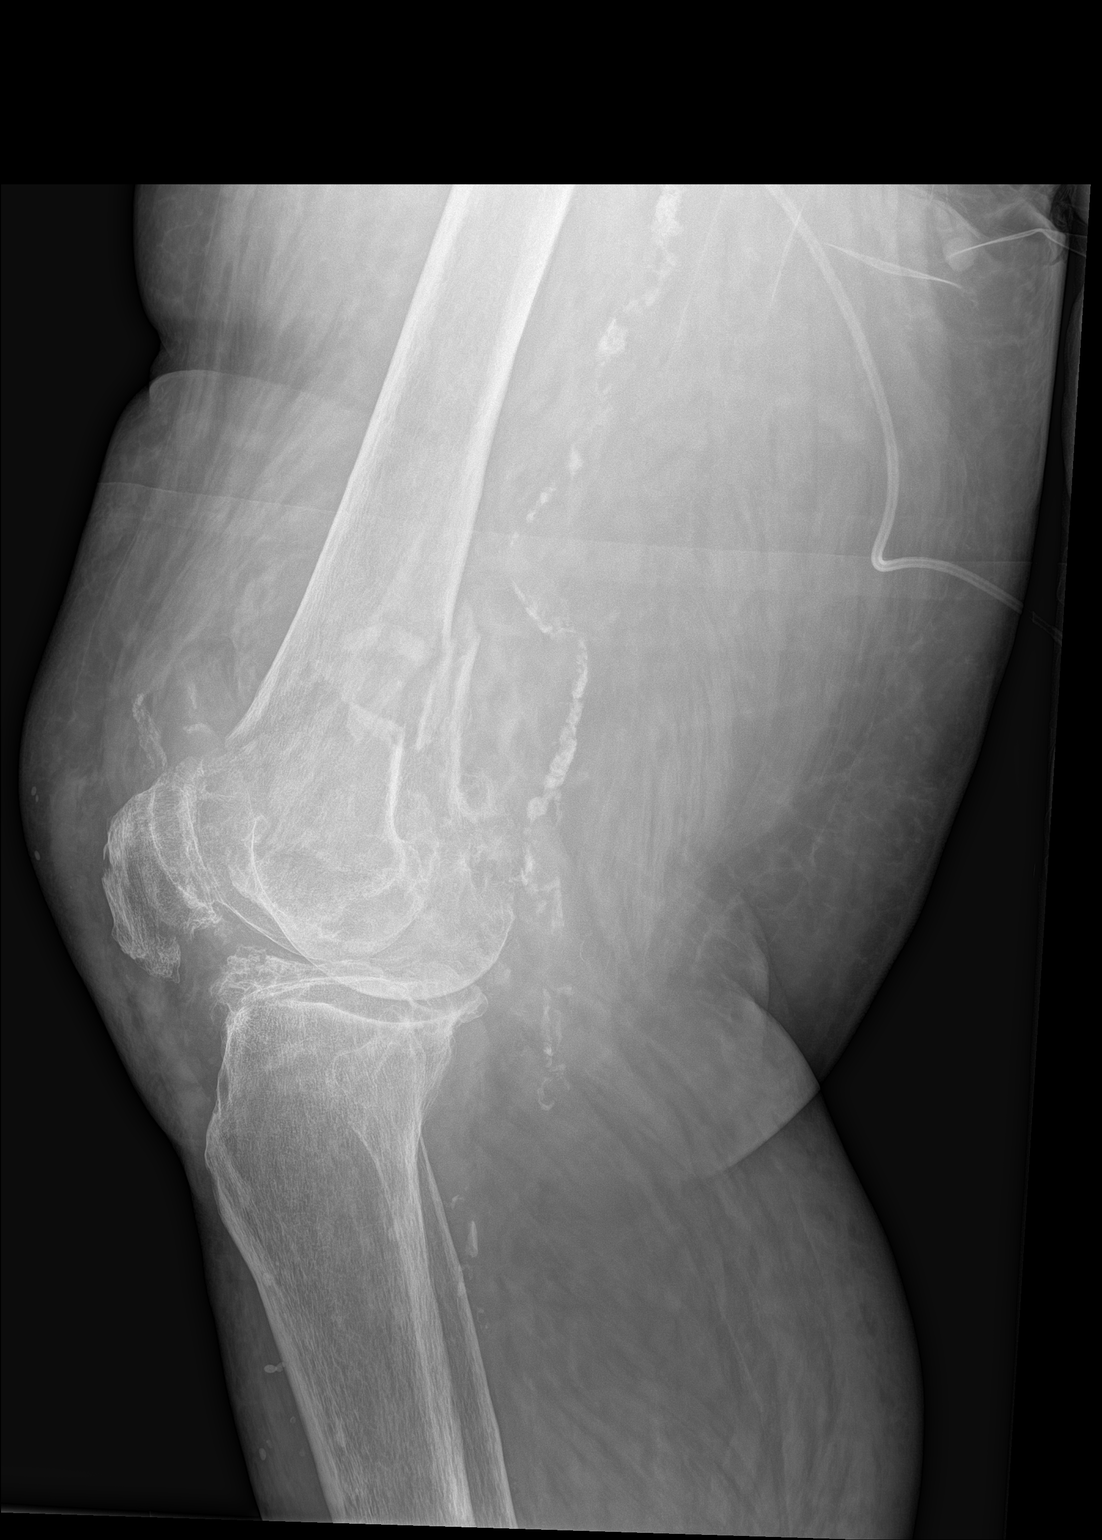

[2 of 2 positions shown; findings below may reference images not displayed]

FINDINGS: Diffuse demineralization of bone. Comminuted impacted fracture of
the distal femoral metadiaphysis with approximately 13 mm medial
displacement and lateral angulation of the distal fracture fragment,
without definite intra-articular extension. Irregularity of the
upper pole of patella, related to prior avulsion fracture. Joint
effusion. Advanced tricompartment degenerative change
chondrocalcinosis. Vascular calcifications.
IMPRESSION: 1. Comminuted, displaced and angulated distal femoral metadiaphyseal
fracture, without definite intra-articular extension.
2. Irregularity of the upper pole of patella, related to prior known
avulsion fracture.
3. Advanced tricompartment degenerative change, possibly related to
CPPD arthropathy.

## 2021-02-03 IMAGING — CR DG CHEST 2V
2 series · 2 of 2 positions shown · non-contrast
Comparison: [DATE]

CLINICAL DATA: Preoperative radiography

EXAM:
CHEST - 2 VIEW

[chest lat]
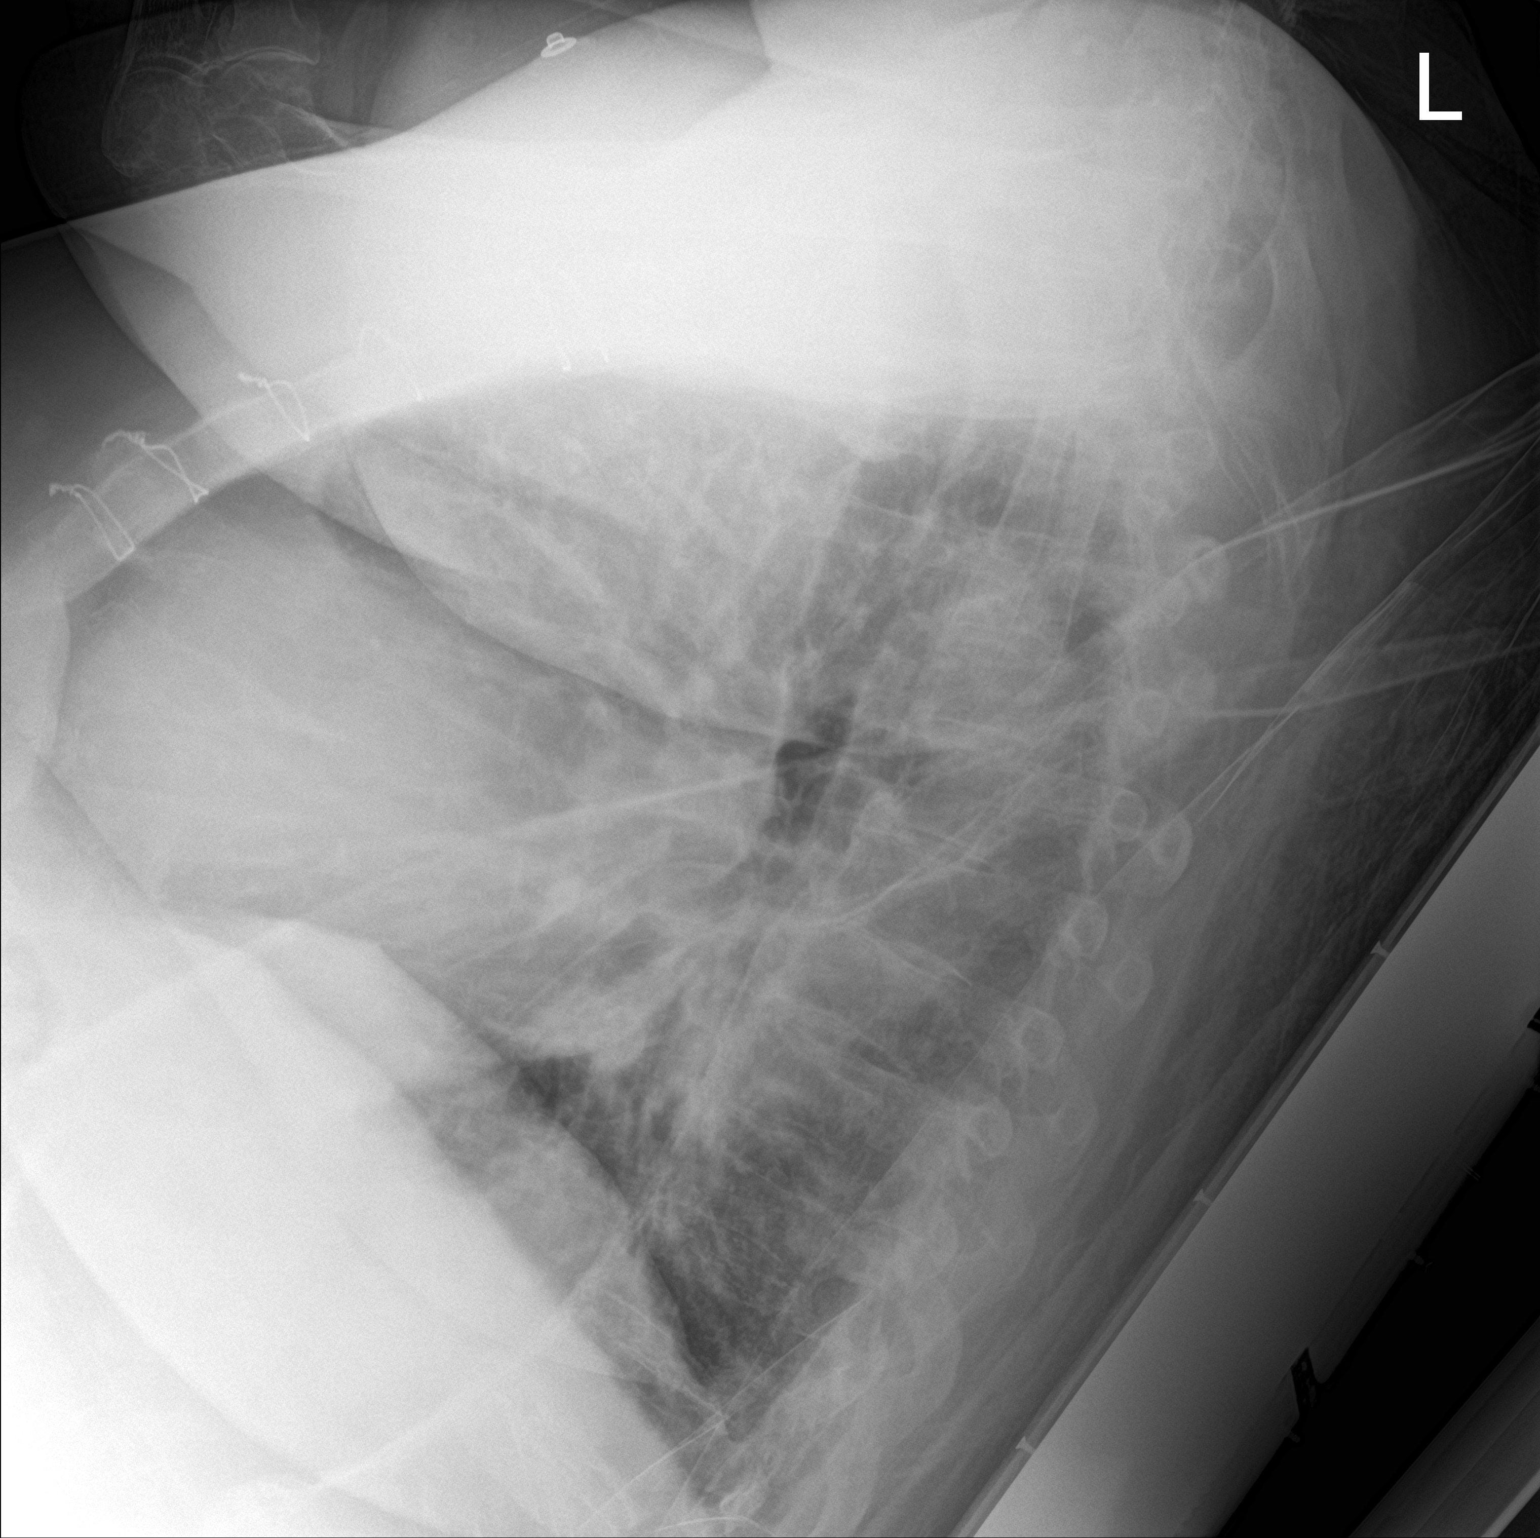

[chest ap]
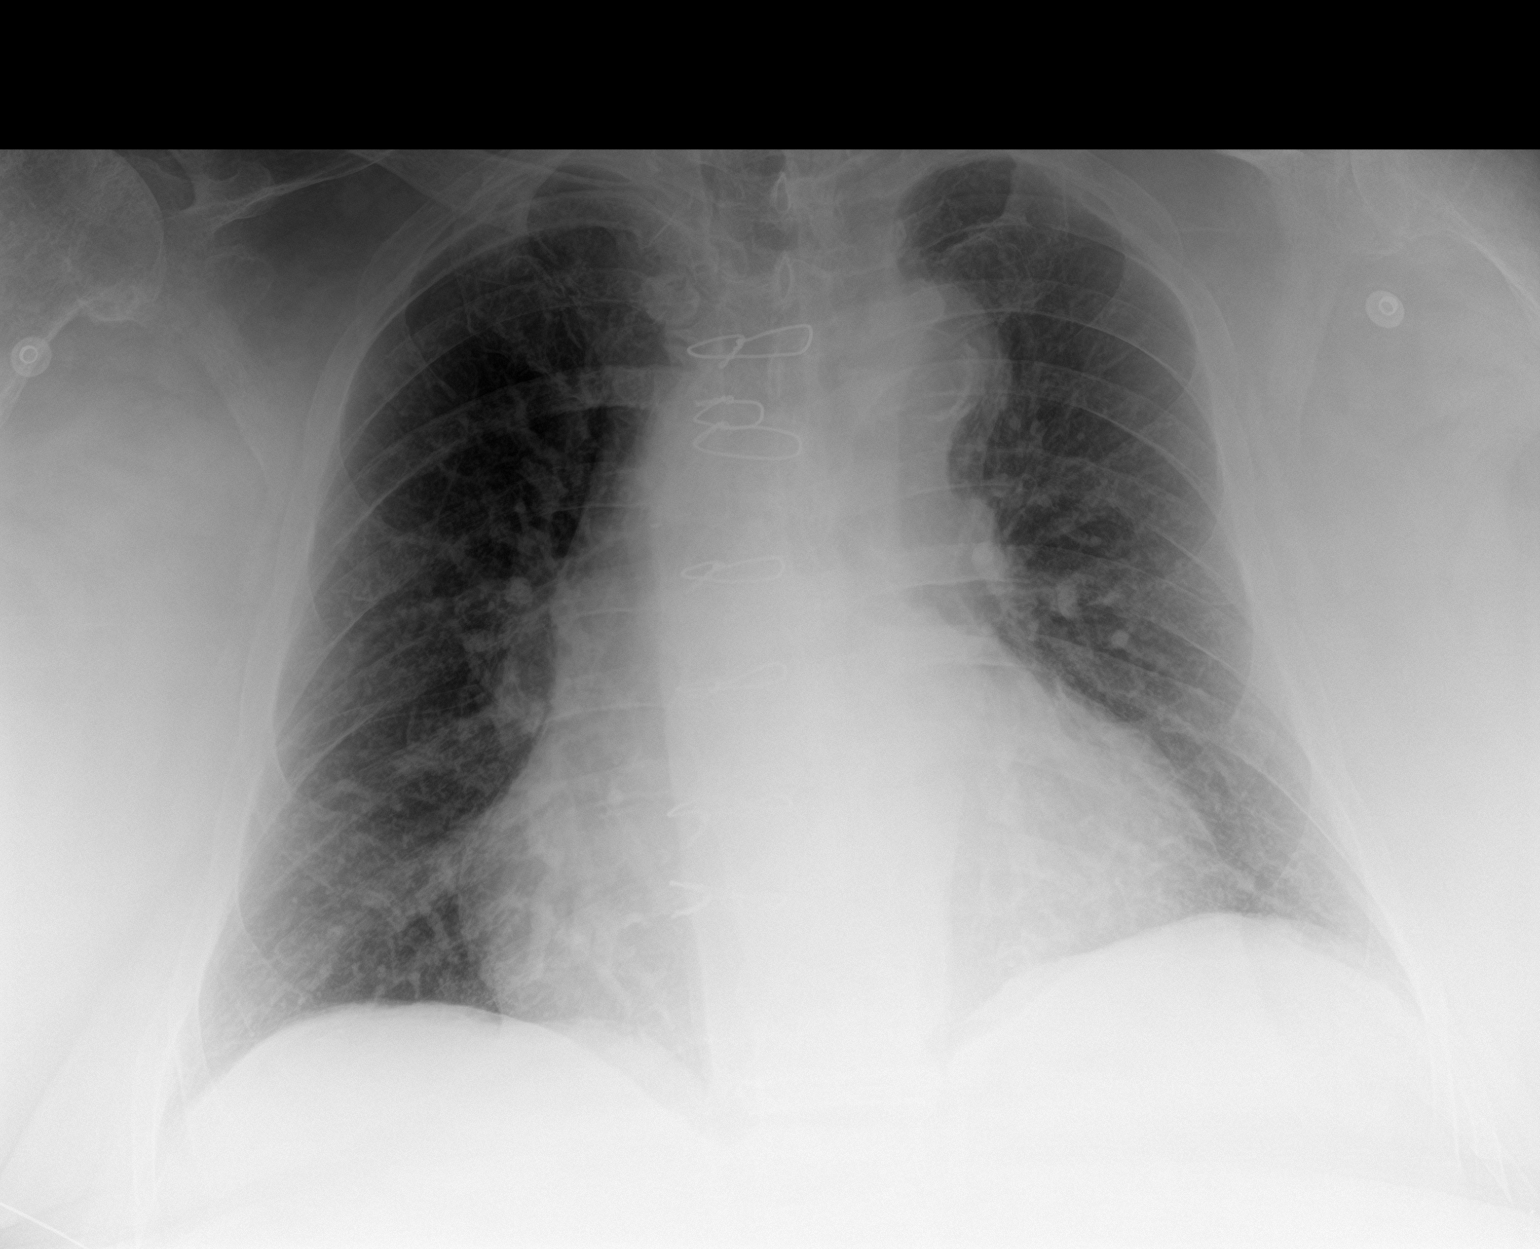

[2 of 2 positions shown; findings below may reference images not displayed]

FINDINGS: Prior sternotomy and CABG. Cardiomegaly. Tortuous and calcified
aorta. Remaining cardiomediastinal contours are unremarkable.

Pulmonary vascular congestion with indistinct and cephalized
pulmonary vascularity.

Some hazy reticular opacities are present with mild fissural
thickening and few septal lines which could reflect combination of
atelectasis and mild interstitial edema. No consolidative process.
No pneumothorax. No effusion.

Degenerative changes in the shoulders, right greater than left.
Multilevel degenerative changes are present in the imaged portions
of the spine.
IMPRESSION: Appearance most compatible with mild CHF/volume overload with
cardiomegaly, vascular congestion and hazy interstitial opacity
suggestive of pulmonary edema.

Aortic Atherosclerosis ([0H]-[0H]).

## 2021-02-03 IMAGING — CT CT CERVICAL SPINE W/O CM
3 of 4 series · 12 of 33 positions shown, 14 images · non-contrast
Comparison: None.

CLINICAL DATA: 75-year-old male with neck trauma.

EXAM:
CT HEAD WITHOUT CONTRAST
CT CERVICAL SPINE WITHOUT CONTRAST
TECHNIQUE: Multidetector CT imaging of the head and cervical spine was
performed following the standard protocol without intravenous
contrast. Multiplanar CT image reconstructions of the cervical spine
were also generated.

[Series 4: c_spine 2.0 st · axial · 0.41mm/px · z∈[-242,-106]mm · 4 of 103 slices shown, 5 images]
[im 18/103  soft-tissue]
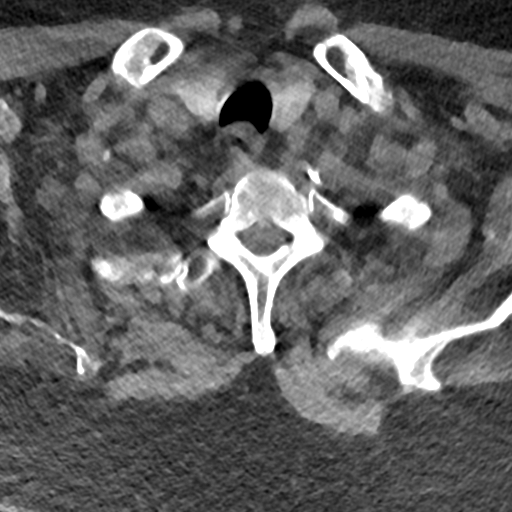
[im 18/103  bone]
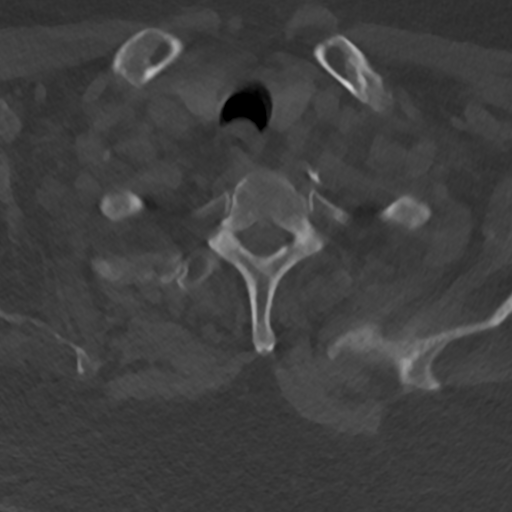
[im 35/103  bone]
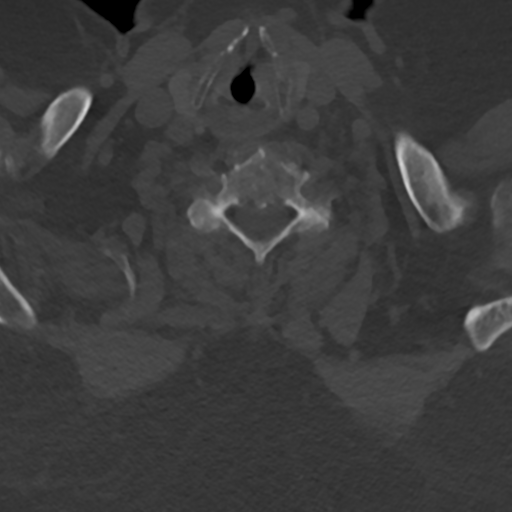
[im 69/103  bone]
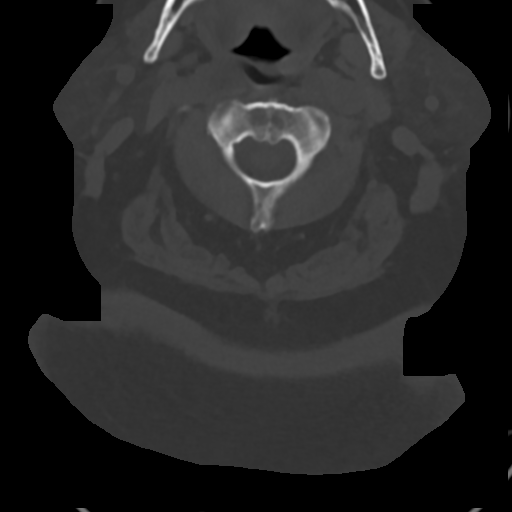
[im 86/103  bone]
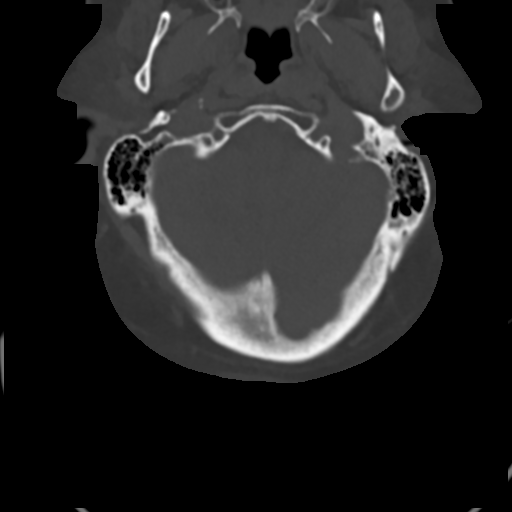

[Series 6: c_spine 2.0 sag bone · sagittal · 0.31mm/px · 5 of 74 slices shown, 6 images]
[im 25/74  bone]
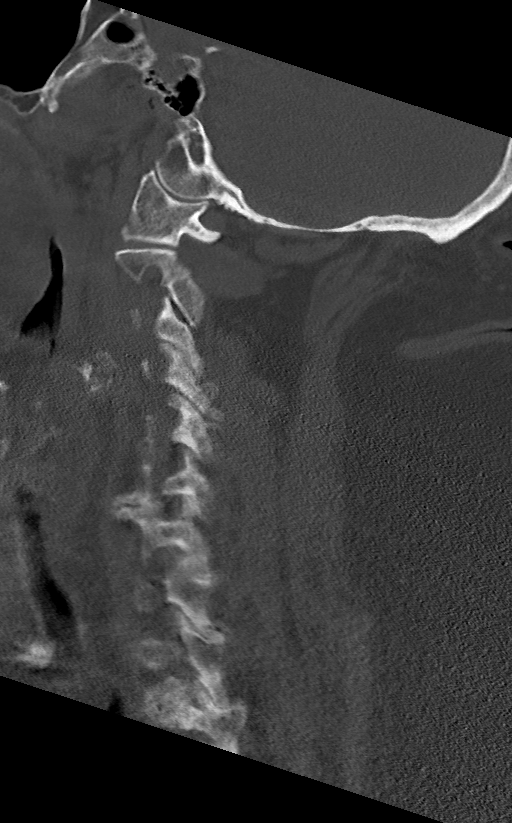
[im 31/74  bone]
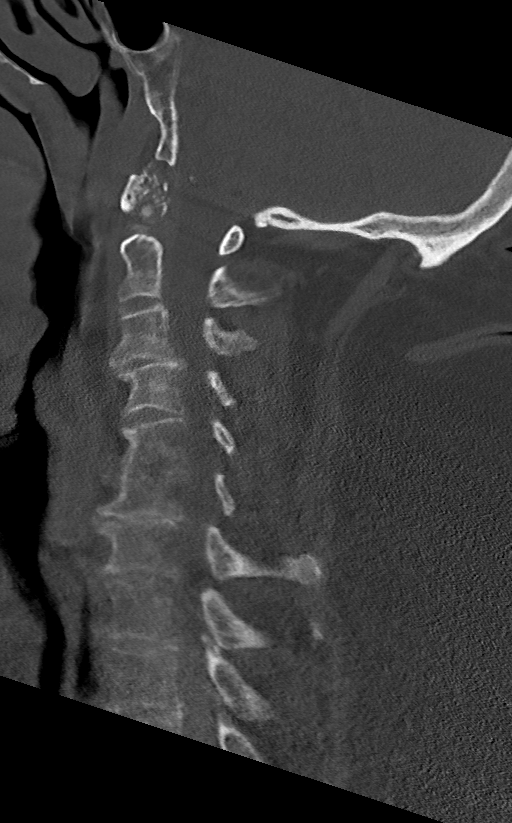
[im 37/74  soft-tissue]
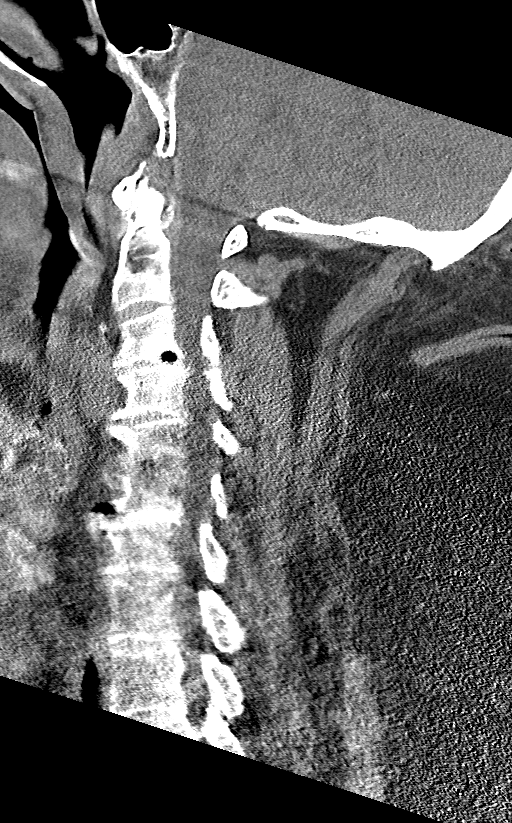
[im 37/74  bone]
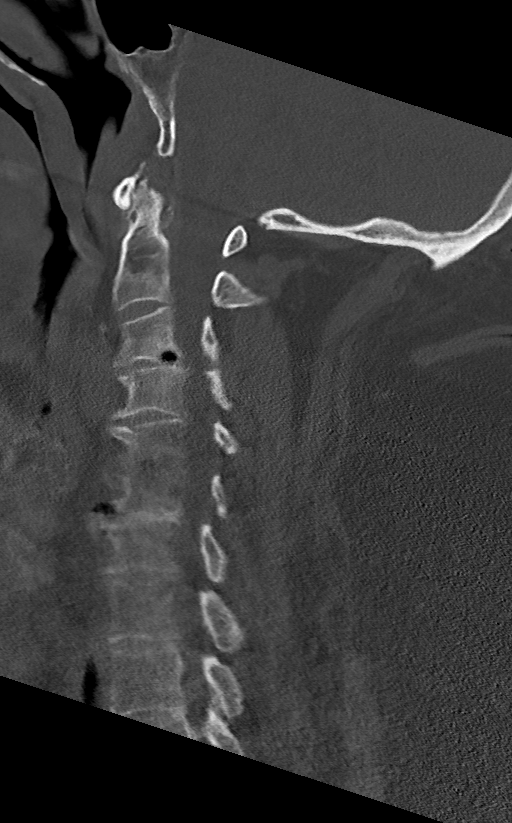
[im 43/74  bone]
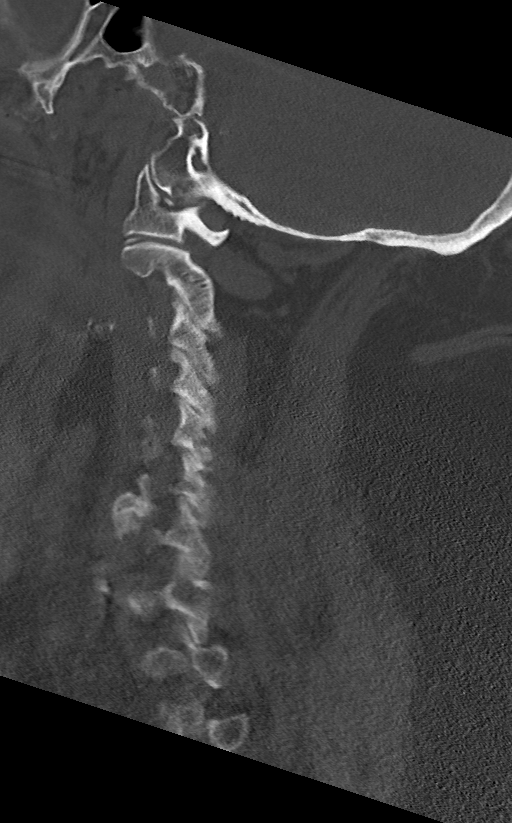
[im 49/74  bone]
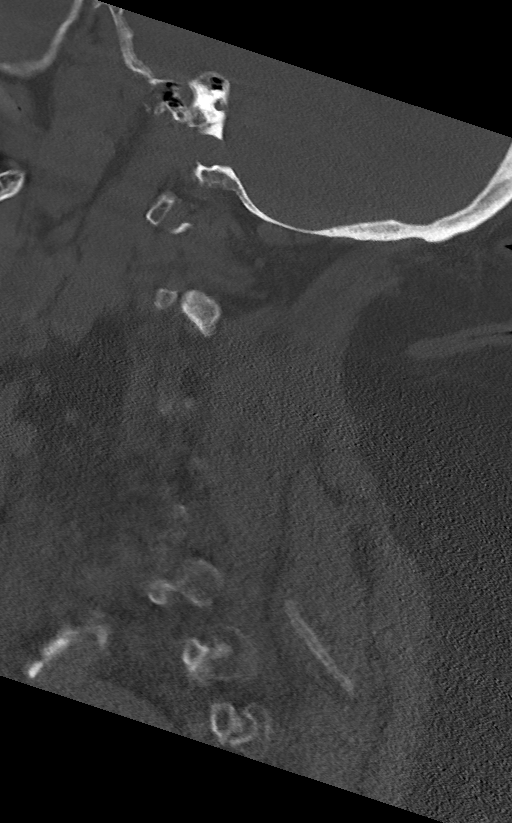

[Series 7: c_spine 2.0 cor bone · coronal · 0.28mm/px · 3 of 65 slices shown]
[im 13/65  bone]
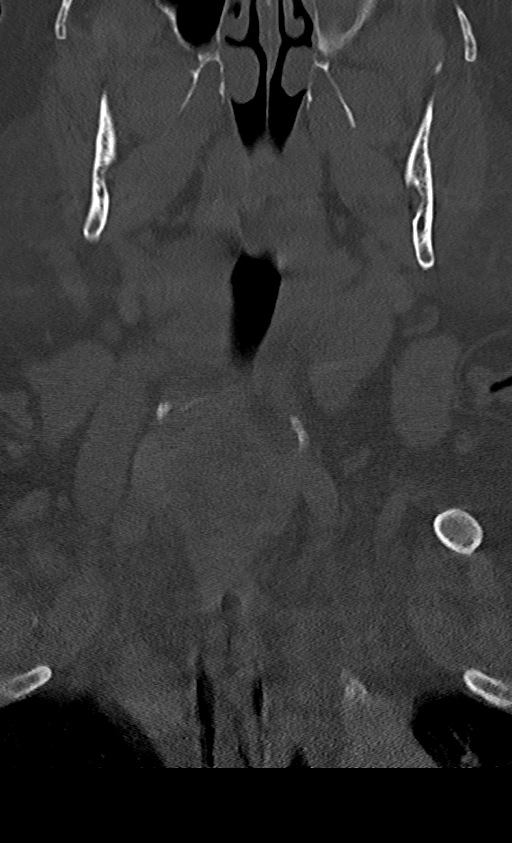
[im 26/65  bone]
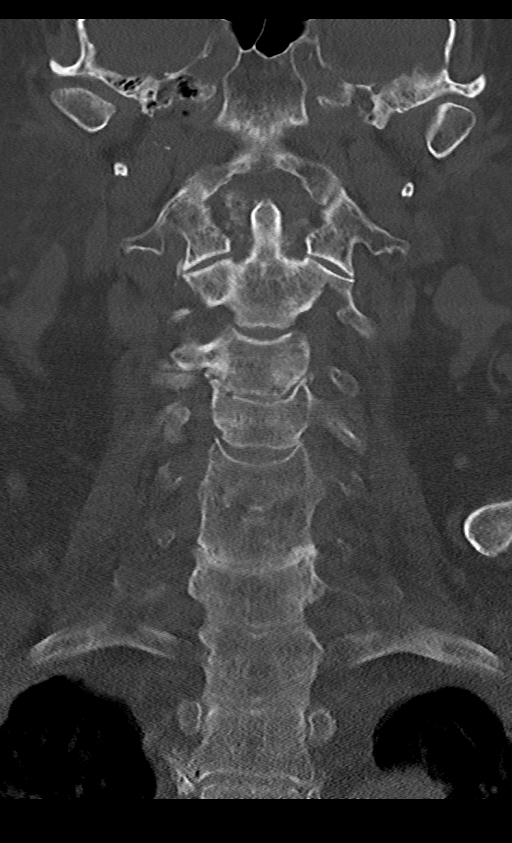
[im 39/65  bone]
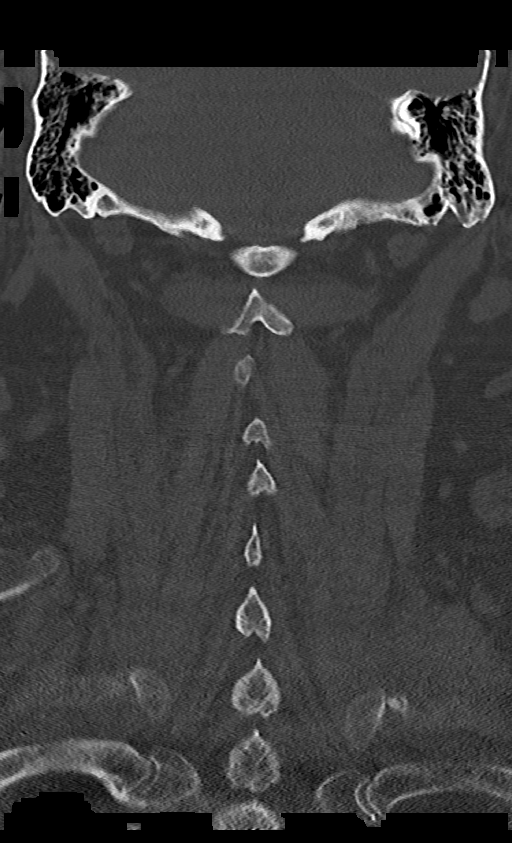

[12 of 33 positions shown; findings below may reference images not displayed]

FINDINGS: CT HEAD FINDINGS

Brain: Mild age-related atrophy and chronic microvascular ischemic
changes. There is no acute intracranial hemorrhage. No mass effect
or midline shift no extra-axial fluid collection.

Vascular: No hyperdense vessel or unexpected calcification.

Skull: Normal. Negative for fracture or focal lesion.

Sinuses/Orbits: There is complete opacification of the left
maxillary sinus. The remainder of the visualized paranasal sinuses
and mastoid air cells are clear. No air-fluid level.

Other: None

CT CERVICAL SPINE FINDINGS

Alignment: No acute subluxation. There is straightening of normal
cervical lordosis which may be positional or due to muscle spasm.

Skull base and vertebrae: No acute fracture. Osteopenia.

Soft tissues and spinal canal: No prevertebral fluid or swelling. No
visible canal hematoma.

Disc levels: Multilevel degenerative changes with disc space
narrowing and spurring.

Upper chest: Negative.

Other: Bilateral carotid bulb calcified plaques.
IMPRESSION: 1. No acute intracranial pathology. Mild age-related atrophy and
chronic microvascular ischemic changes.
2. No acute/traumatic cervical spine pathology. Multilevel
degenerative changes.

## 2021-02-03 IMAGING — CT CT HEAD W/O CM
3 series · 16 of 37 positions shown, 18 images · non-contrast
Comparison: None.

CLINICAL DATA: 75-year-old male with neck trauma.

EXAM:
CT HEAD WITHOUT CONTRAST
CT CERVICAL SPINE WITHOUT CONTRAST
TECHNIQUE: Multidetector CT imaging of the head and cervical spine was
performed following the standard protocol without intravenous
contrast. Multiplanar CT image reconstructions of the cervical spine
were also generated.

[Series 3: head without · axial · non-contrast · 0.45mm/px · z∈[-89,+41]mm · 7 of 36 slices shown, 9 images]
[im 5/36  brain]
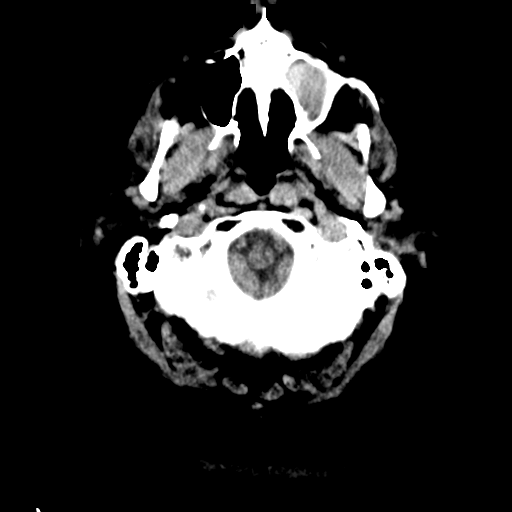
[im 5/36  bone]
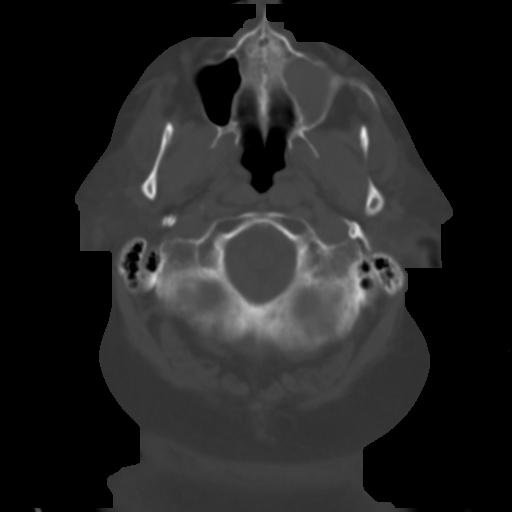
[im 9/36  brain]
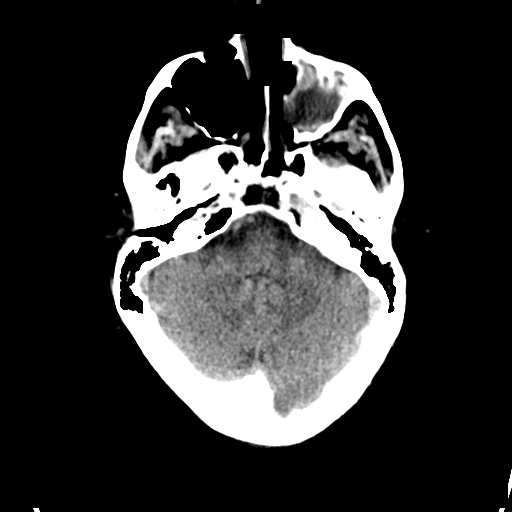
[im 14/36  brain]
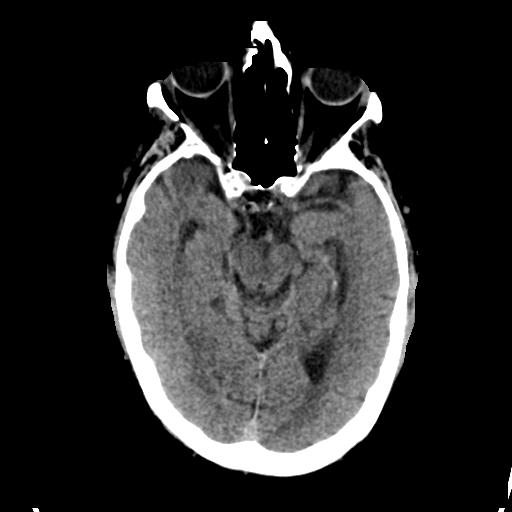
[im 18/36  brain]
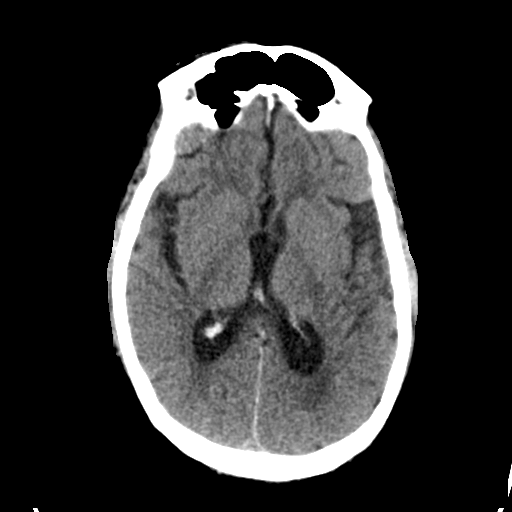
[im 22/36  brain]
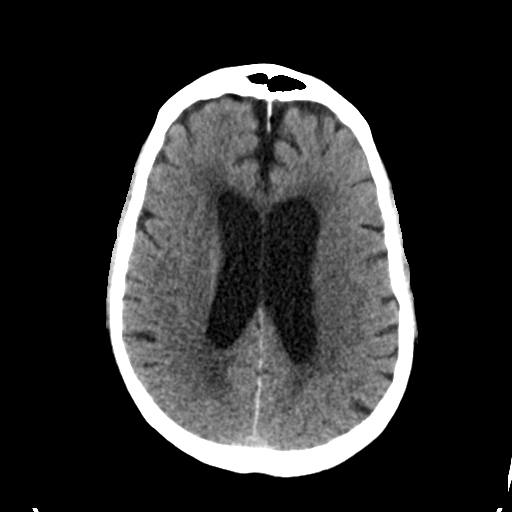
[im 22/36  bone]
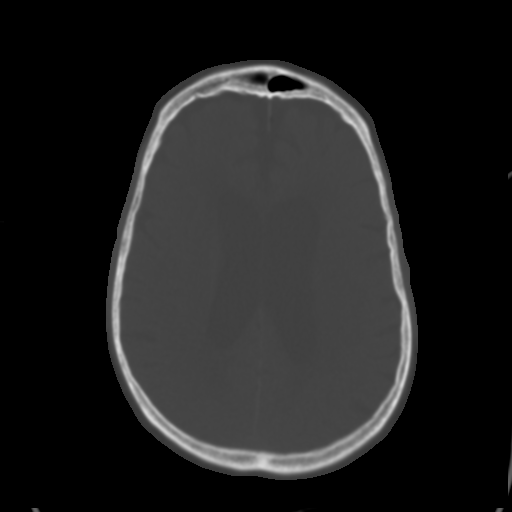
[im 27/36  brain]
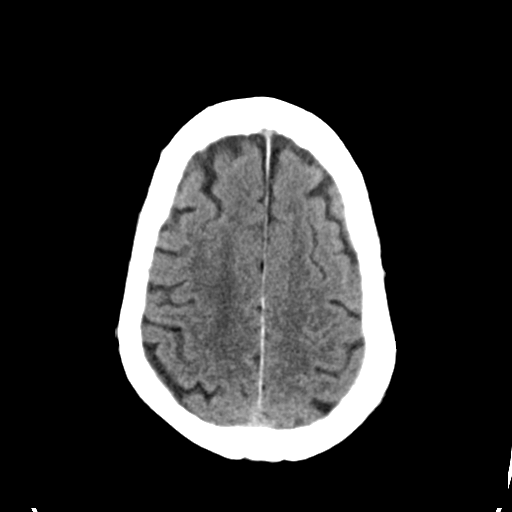
[im 31/36  brain]
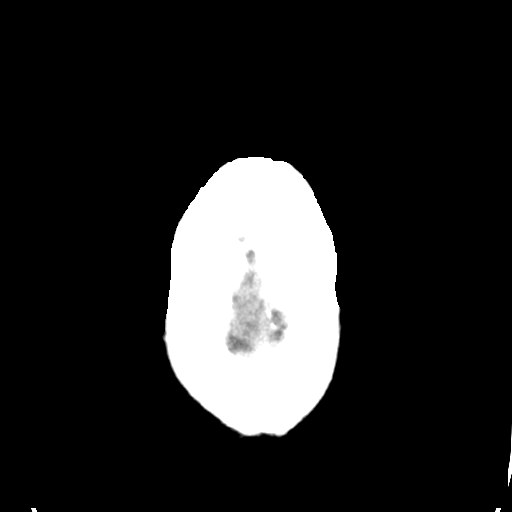

[Series 4: head bone · axial · 0.45mm/px · z∈[-93,+13]mm · 6 of 89 slices shown]
[im 9/89  bone]
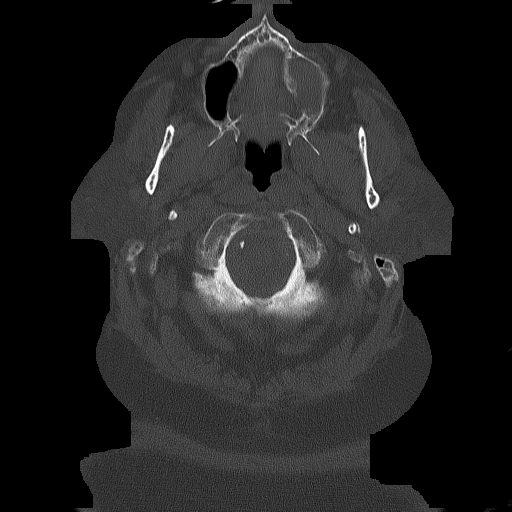
[im 18/89  bone]
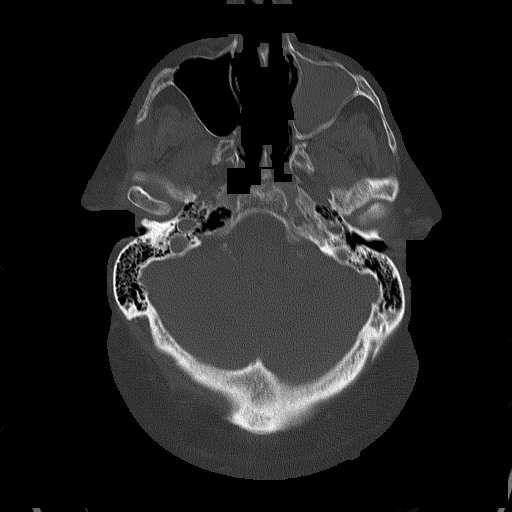
[im 27/89  bone]
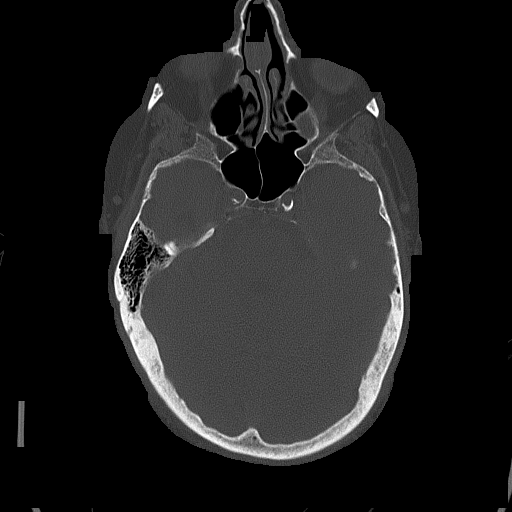
[im 40/89  bone]
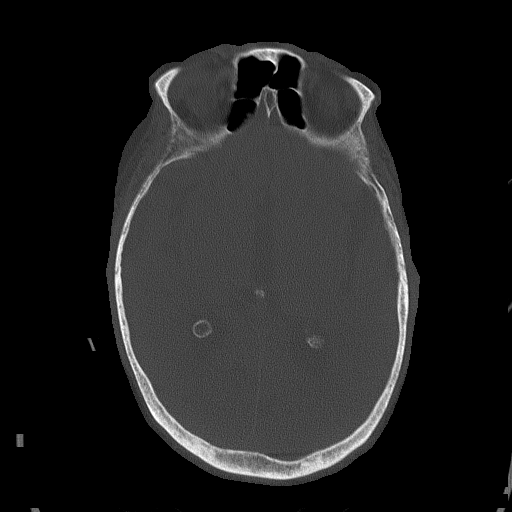
[im 49/89  bone]
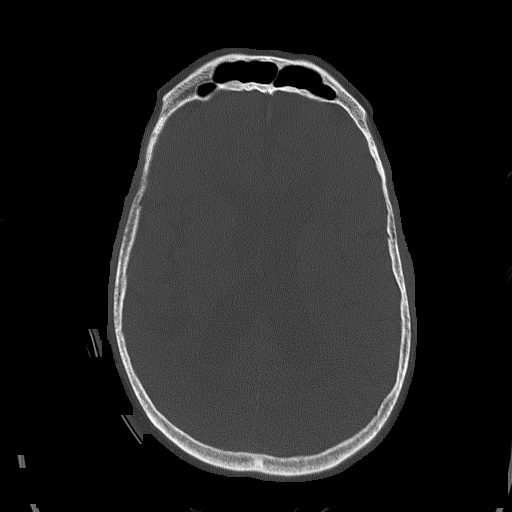
[im 62/89  bone]
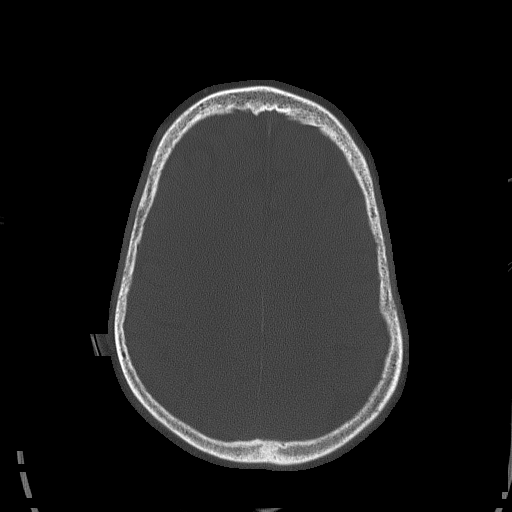

[Series 6: head without sag · sagittal · non-contrast · 0.35mm/px · 3 of 61 slices shown]
[im 21/61  brain]
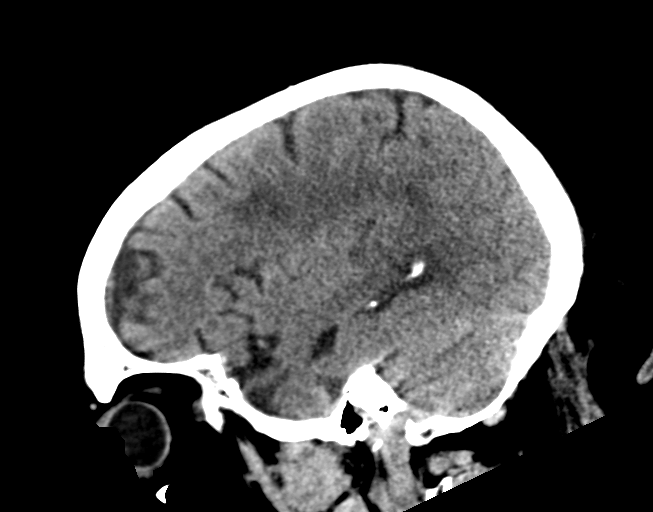
[im 31/61  brain]
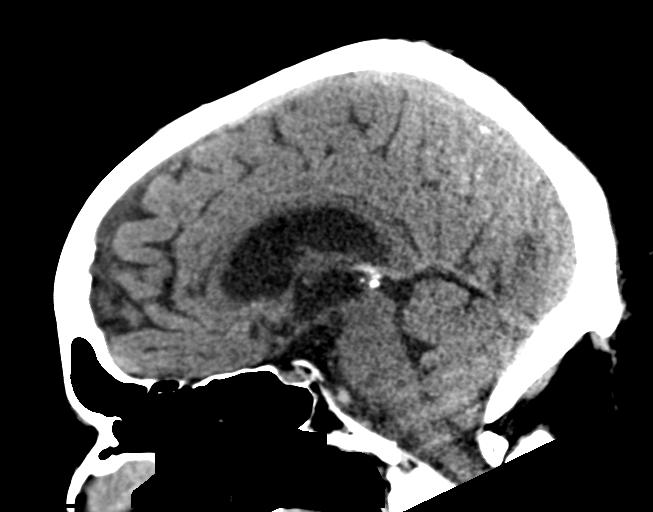
[im 41/61  brain]
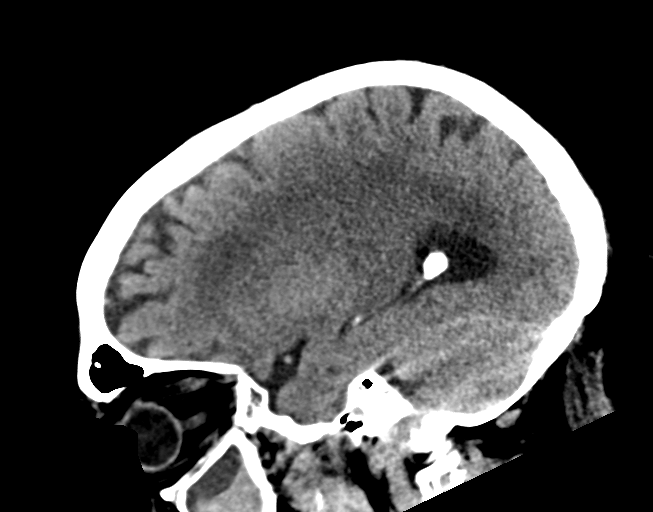

[16 of 37 positions shown; findings below may reference images not displayed]

FINDINGS: CT HEAD FINDINGS

Brain: Mild age-related atrophy and chronic microvascular ischemic
changes. There is no acute intracranial hemorrhage. No mass effect
or midline shift no extra-axial fluid collection.

Vascular: No hyperdense vessel or unexpected calcification.

Skull: Normal. Negative for fracture or focal lesion.

Sinuses/Orbits: There is complete opacification of the left
maxillary sinus. The remainder of the visualized paranasal sinuses
and mastoid air cells are clear. No air-fluid level.

Other: None

CT CERVICAL SPINE FINDINGS

Alignment: No acute subluxation. There is straightening of normal
cervical lordosis which may be positional or due to muscle spasm.

Skull base and vertebrae: No acute fracture. Osteopenia.

Soft tissues and spinal canal: No prevertebral fluid or swelling. No
visible canal hematoma.

Disc levels: Multilevel degenerative changes with disc space
narrowing and spurring.

Upper chest: Negative.

Other: Bilateral carotid bulb calcified plaques.
IMPRESSION: 1. No acute intracranial pathology. Mild age-related atrophy and
chronic microvascular ischemic changes.
2. No acute/traumatic cervical spine pathology. Multilevel
degenerative changes.

## 2021-02-03 IMAGING — CT CT KNEE*R* W/O CM
3 of 5 series · 14 of 35 positions shown, 16 images · non-contrast
Comparison: Plain film from earlier in the same day as well as
prior CT from [DATE]

CLINICAL DATA: Recent fall with known distal right femoral fracture

EXAM:
CT OF THE RIGHT KNEE WITHOUT CONTRAST
TECHNIQUE: Multidetector CT imaging of the right knee was performed according
to the standard protocol. Multiplanar CT image reconstructions were
also generated.

[Series 5: extremity soft tissue · axial · 0.74mm/px · z∈[-464,-124]mm · 7 of 202 slices shown, 9 images]
[im 16/202  soft-tissue]
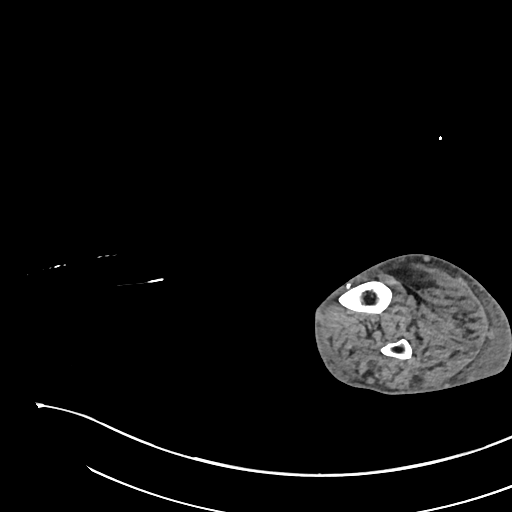
[im 16/202  bone]
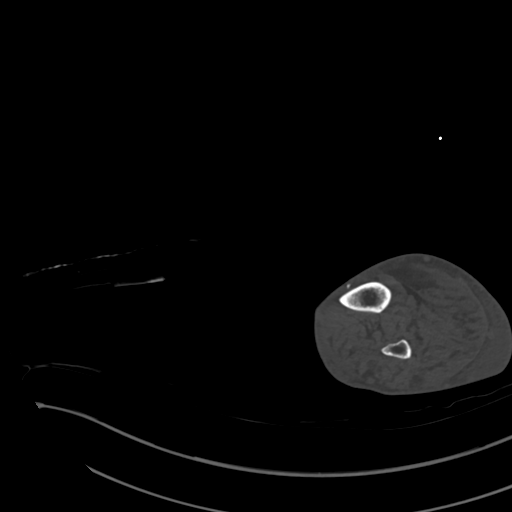
[im 47/202  bone]
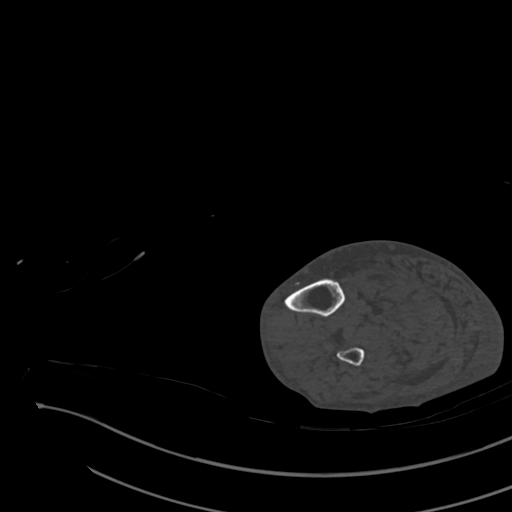
[im 78/202  bone]
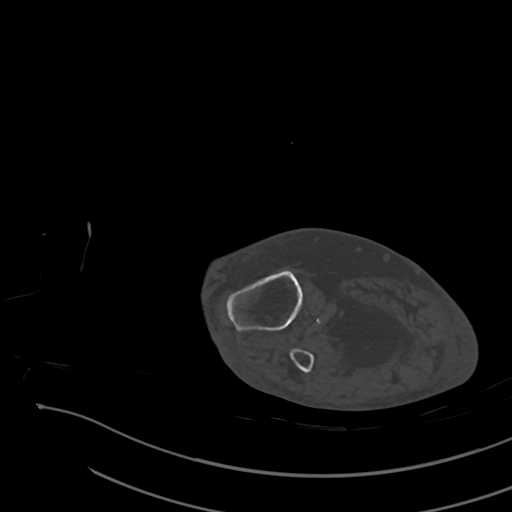
[im 109/202  bone]
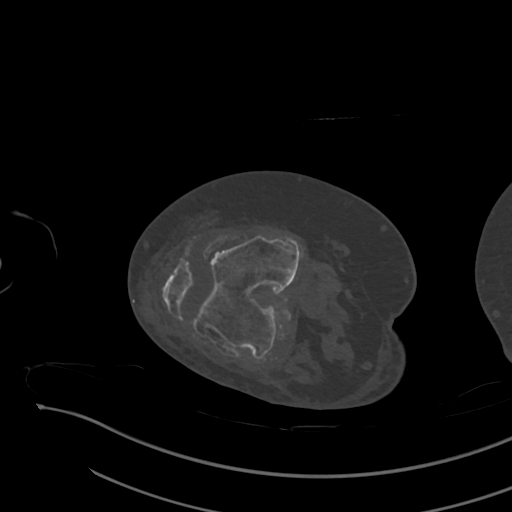
[im 124/202  soft-tissue]
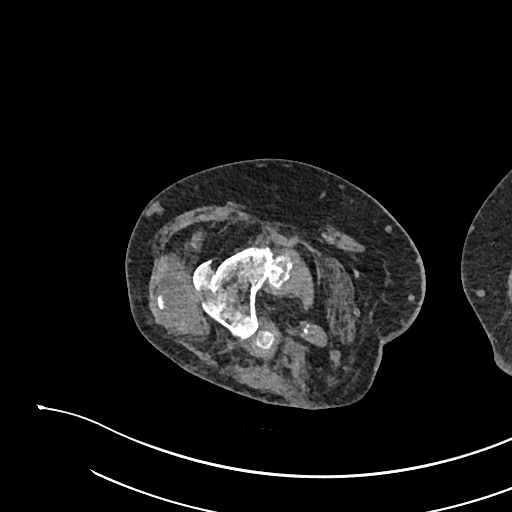
[im 124/202  bone]
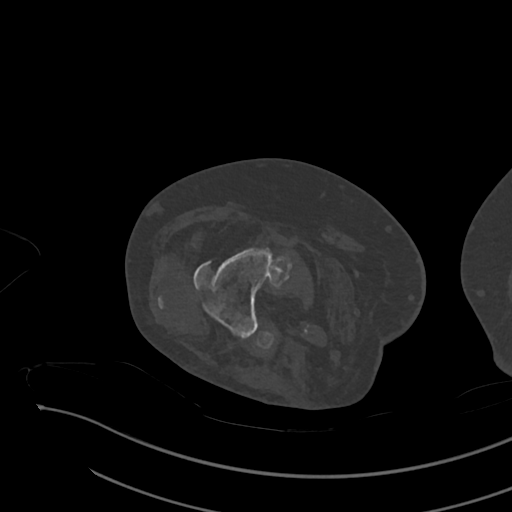
[im 155/202  bone]
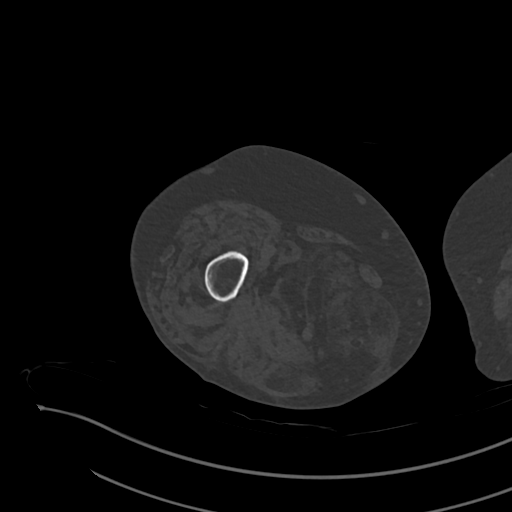
[im 186/202  bone]
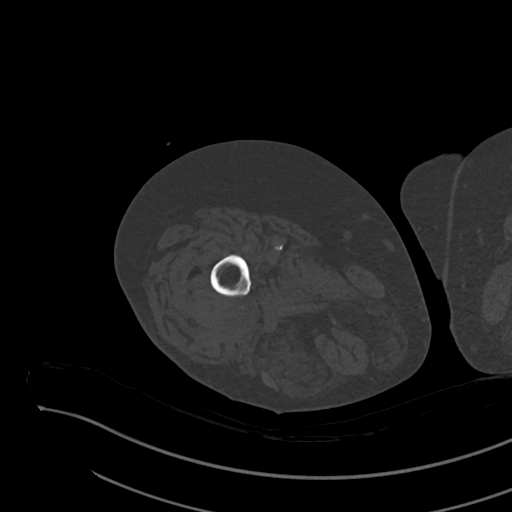

[Series 7: cor bone · sagittal · 0.54mm/px · 5 of 117 slices shown]
[im 15/117  soft-tissue]
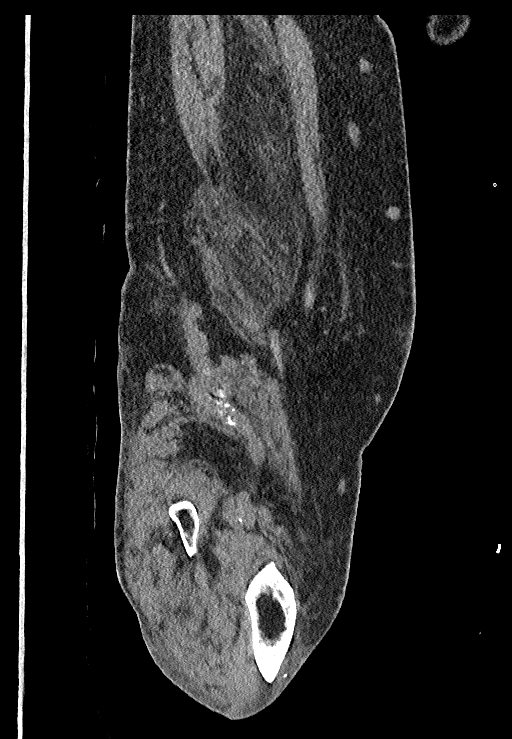
[im 24/117  bone]
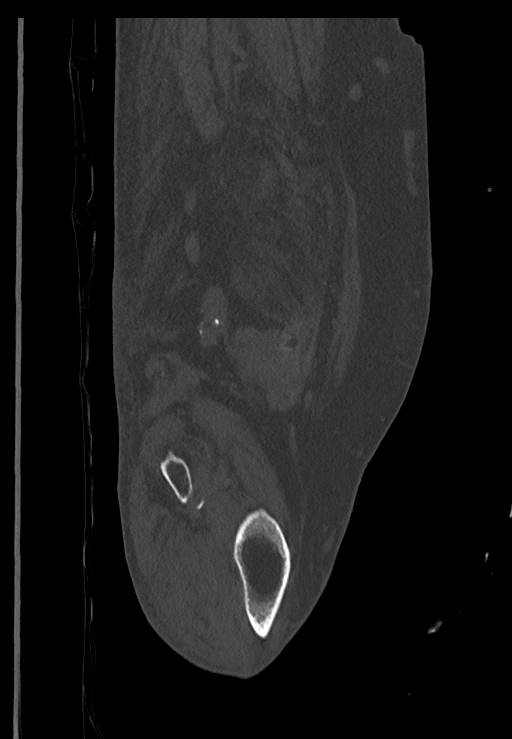
[im 47/117  bone]
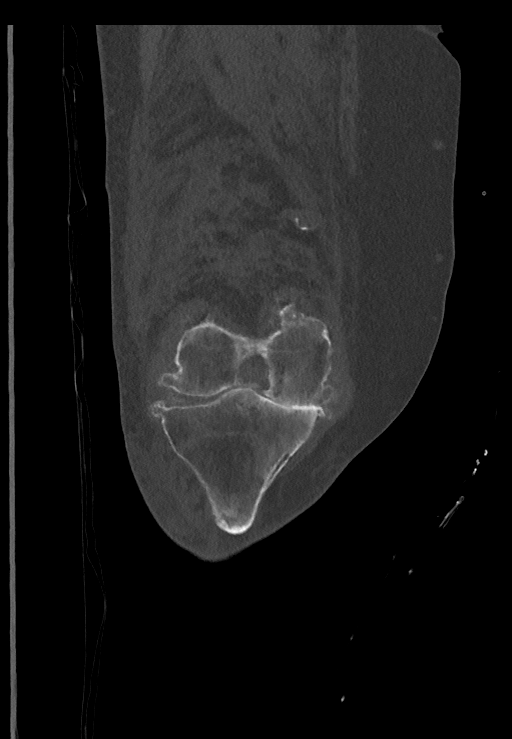
[im 70/117  bone]
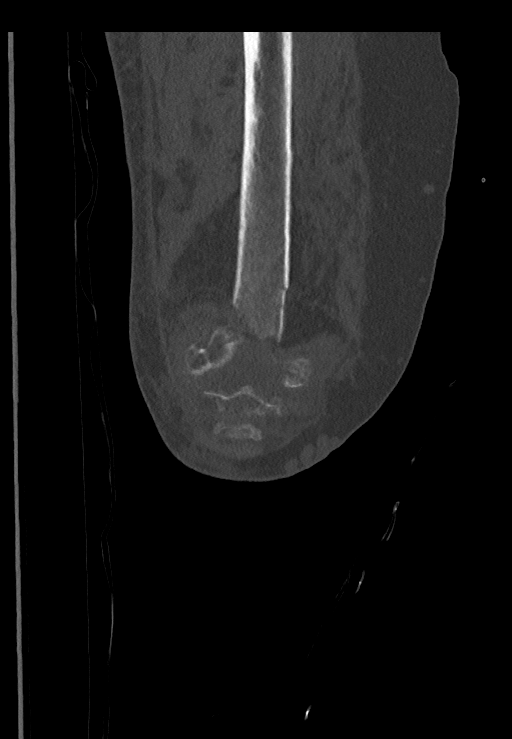
[im 93/117  bone]
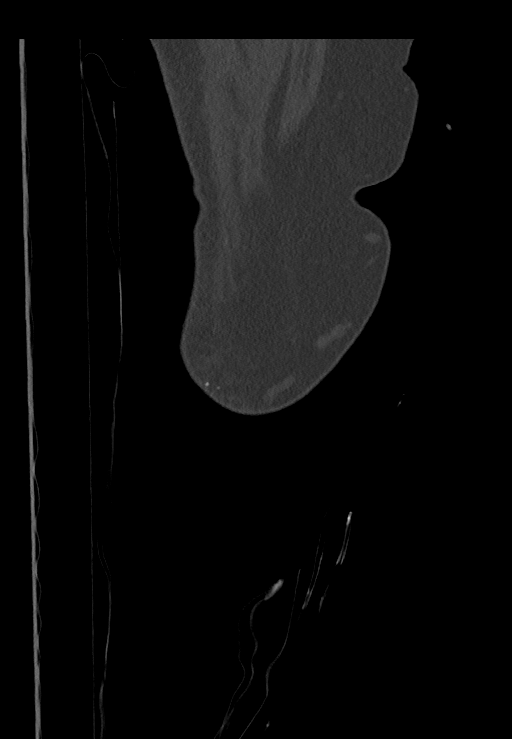

[Series 10: sag soft tissue · coronal · 0.65mm/px · 2 of 202 slices shown]
[im 68/202  bone]
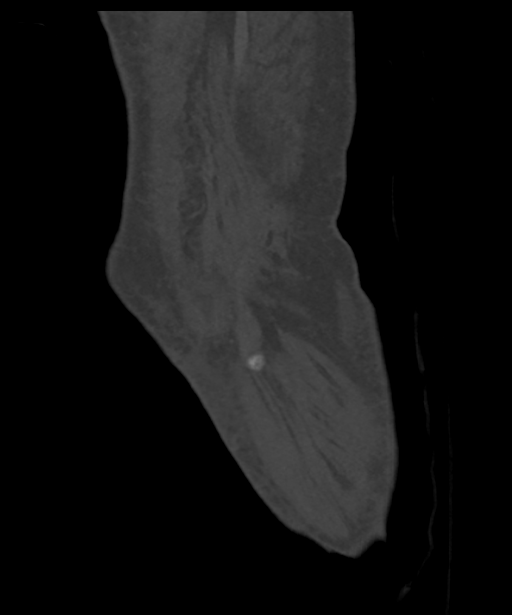
[im 135/202  bone]
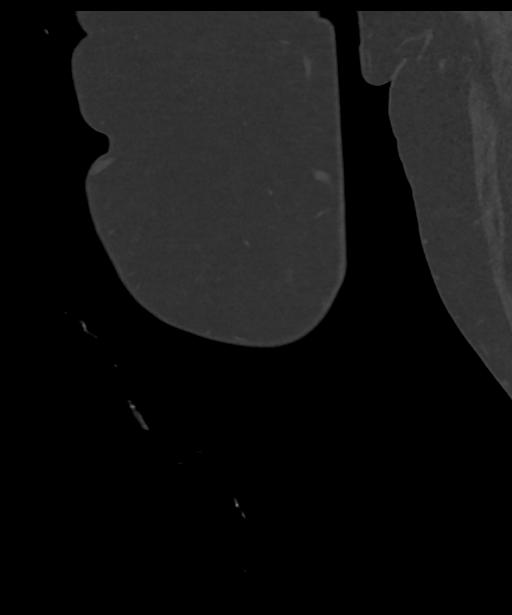

[14 of 35 positions shown; findings below may reference images not displayed]

FINDINGS: Bones/Joint/Cartilage

There is again noted a comminuted fracture of the distal right femur
involving primarily the distal aspect of the diaphysis and proximal
metaphysis. Posterior and lateral displacement of the distal
fracture fragments is noted. Some mild impaction is seen as well
with approximately 1 cm of bony overlap along the medial aspect of
the distal femur. No articular involvement is identified.
Osteophytic changes are noted in all 3 joint compartments. Proximal
tibia and fibula show no acute fracture. Further fragmentation in
the anterior and inferior aspect of the patella is noted although no
significant distraction is seen.

Ligaments

Suboptimally assessed by CT.

Muscles and Tendons

Surrounding musculature demonstrates some fatty infiltration likely
related to disuse atrophy.

Soft tissues

Joint effusion is noted. Some prepatellar soft tissue swelling and
surrounding subcutaneous edema is noted as well.
IMPRESSION: Comminuted and displaced distal right femoral fracture without
significant intra-articular involvement. Associated soft tissue
swelling and joint effusion is noted.

Further displacement of patellar fracture when compared with the
previous exam. No distraction of the patellar fragments is seen
however.

Tricompartmental degenerative changes.

## 2021-02-03 IMAGING — DX DG PELVIS 1-2V
1 series · 1 of 1 positions shown · non-contrast
Comparison: CT [DATE]

CLINICAL DATA: Pain after fall

EXAM:
PELVIS - 1-2 VIEW

[pelvis ap]
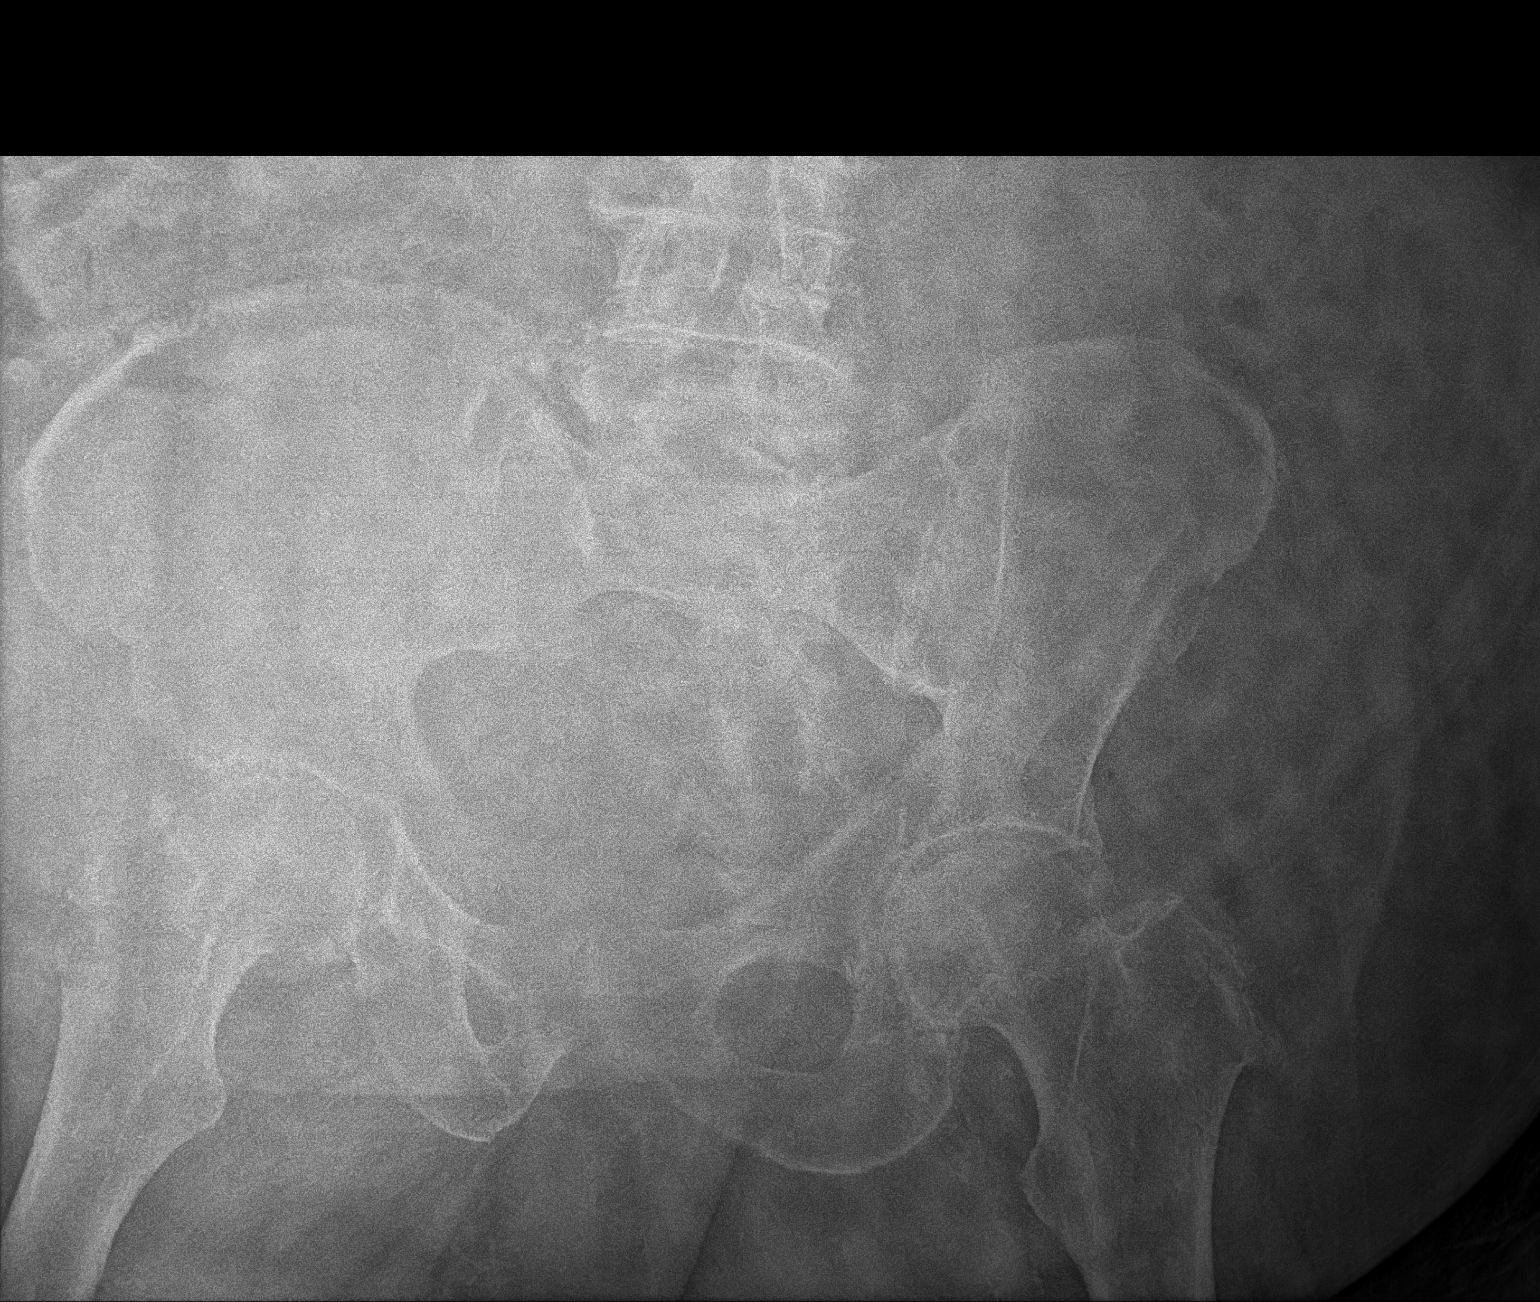

[1 of 1 positions shown; findings below may reference images not displayed]

FINDINGS: Diffuse demineralization of bone. There is no evidence of pelvic
fracture or diastasis. Degenerative change of the hips. Lower lumbar
spondylosis.
IMPRESSION: No acute displaced fracture or dislocation on this single AP view of
the pelvis.

## 2021-02-03 MED ORDER — MORPHINE SULFATE (PF) 4 MG/ML IV SOLN
4.0000 mg | Freq: Once | INTRAVENOUS | Status: AC
  Administered 2021-02-03: 4 mg via INTRAVENOUS
  Filled 2021-02-03: qty 1

## 2021-02-03 MED ORDER — NALOXONE HCL 0.4 MG/ML IJ SOLN: 0.4000 mg | INTRAMUSCULAR | Status: DC | PRN

## 2021-02-03 MED ORDER — ONDANSETRON HCL 4 MG/2ML IJ SOLN: 4.0000 mg | Freq: Four times a day (QID) | INTRAMUSCULAR | Status: DC | PRN

## 2021-02-03 MED ORDER — ACETAMINOPHEN 650 MG RE SUPP: 650.0000 mg | Freq: Four times a day (QID) | RECTAL | Status: DC | PRN

## 2021-02-03 MED ORDER — FENTANYL CITRATE (PF) 100 MCG/2ML IJ SOLN: 50.0000 ug | Freq: Once | INTRAMUSCULAR | Status: DC

## 2021-02-03 MED ORDER — ACETAMINOPHEN 325 MG PO TABS: 650.0000 mg | ORAL_TABLET | Freq: Four times a day (QID) | ORAL | Status: DC | PRN

## 2021-02-03 MED ORDER — MORPHINE SULFATE (PF) 4 MG/ML IV SOLN
4.0000 mg | INTRAVENOUS | Status: DC | PRN
  Administered 2021-02-03 – 2021-02-04 (×3): 4 mg via INTRAVENOUS
  Filled 2021-02-03 (×3): qty 1

## 2021-02-03 NOTE — Progress Notes (Signed)
Brief note regarding preliminary plan, with full H&P to follow:  76 year old male who is being admitted for definitive surgical intervention for acute distal right femoral neck fracture after experiencing a mechanical ground-level fall earlier today without any associated loss of consciousness.  Additionally, patient did not hit his head as a component of this fall.  Case/imaging have been discussed with the on-call orthopedic surgeon, Dr Griffin Basil, who is planning to take patient to the OR tomorrow for definitive surgical intervention.  N.p.o. at midnight.  As needed IV morphine.  Following for result of INR.  will check preoperative EKG.  Refraining from pharmacologic DVT prophylaxis in the perioperative setting.    Babs Bertin, DO Hospitalist

## 2021-02-03 NOTE — ED Provider Notes (Addendum)
[facility] EMERGENCY DEPARTMENT Provider Note   CSN: [id number] Arrival date & time: 02/03/21  2102     History Chief Complaint  Patient presents with   Fall   Knee Injury    Brandon [name] is a 75 y.o. male.  75 y.o male with a PMH of Anemia, Alcoholic cirrhosis presents to the ED via EMS with a chief complaint of right knee pain status post mechanical fall x2 hours prior to arrival.  Patient reports he was sitting in his living room, when he went to get up and use his wheelchair and take 2 steps, accidentally he fell forward, landing mostly on the right knee on a carpeted floor.  Reports there is significant pain to the right knee, especially with movement along with palpation.  He has not ambulated since accident occurred.  He also received 100 mics of fentanyl by EMS.  He did not strike his head, denies any loss of consciousness.  No headache, currently taking blood thinners.  No other injury.   The history is provided by the patient.  Fall This is a new problem. The current episode started 1 to 2 hours ago. The problem occurs constantly. The problem has been gradually worsening. Pertinent negatives include no chest pain, no abdominal pain, no headaches and no shortness of breath. The symptoms are aggravated by exertion. Nothing relieves the symptoms. He has tried nothing for the symptoms.      Past Medical History:  Diagnosis Date   Anemia 07/2019   Hb 6.7 >> 3 PRBCs >> 8.8 in 07/2019.      Atrial fibrillation ([place])    Cholelithiasis    Cirrhosis of liver ([place])    Presumed due to EtOH as well as fatty liver from morbid obesity.  Cirrhosis evident on CT scan 07/2018 but finding overlooked and formal diagnoses not made until 07/2019   Colon cancer [facility]) 1999   Partial colectomy   Coronary artery disease    Diabetes mellitus without complication ([place])    Diverticulosis    Esophageal varices ([place])    Gastric AVM    Gastric hemorrhage due to angiodysplasia of  stomach 07/2019   Obesity 07/2019   Portal hypertension ([place])    Tubular adenoma of colon     Patient Active Problem List   Diagnosis Date Noted   Stress fracture, right femur, initial encounter for fracture 02/03/2021   Chronic heart failure with preserved ejection fraction (HFpEF) ([place]) 06/07/2020   Hyperkalemia 06/07/2020   Weakness generalized 06/07/2020   Physical deconditioning 06/06/2020   Hypothermia 05/31/2020   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   Alcoholic cirrhosis of liver with ascites ([place])    Portal hypertensive gastropathy ([place])    Melena 10/02/2019   Angiodysplasia of stomach with hemorrhage    Gastrointestinal hemorrhage    Atrial fibrillation (HCC)    Chronic pain syndrome    Symptomatic anemia 08/13/2019   Acute upper GI bleed 08/13/2019   Avulsion of right patellar tendon    Closed displaced fracture of phalanx of toe of right foot 10/06/2018   Obesity BMI over 52 09/30/2018   Patella fracture, extensor mechanism disruption R knee  09/29/2018   History of colon cancer 08/23/2018   CAD (coronary artery disease) 08/23/2018   DM (diabetes mellitus), type 2 ([place]) 08/23/2018   Pyuria 08/23/2018    Past Surgical History:  Procedure Laterality Date   APPLICATION OF WOUND VAC Right 09/30/2018   Procedure: Application Of Wound Vac;  Surgeon: Altamese Sparkman, MD;  Location: [place];  Service: Orthopedics;  Laterality: Right;   COLONOSCOPY  2010   Removed 2 polyps of unclear type.  Not performed in [place] N/A 09/17/2019   Procedure: COLONOSCOPY WITH PROPOFOL;  Surgeon: [name], MD;  Location: [place];  Service: Endoscopy;  Laterality: N/A;   CORONARY ARTERY BYPASS GRAFT     in his 75s   ESOPHAGOGASTRODUODENOSCOPY (EGD) WITH PROPOFOL N/A 08/14/2019   Procedure: ESOPHAGOGASTRODUODENOSCOPY (EGD) WITH PROPOFOL;  Surgeon: [name], MD;  Location: [place];  Service: Gastroenterology;  Laterality: N/A;    ESOPHAGOGASTRODUODENOSCOPY (EGD) WITH PROPOFOL N/A 09/17/2019   Procedure: ESOPHAGOGASTRODUODENOSCOPY (EGD) WITH PROPOFOL;  Surgeon: [name], MD;  Location: [place];  Service: Endoscopy;  Laterality: N/A;   ESOPHAGOGASTRODUODENOSCOPY (EGD) WITH PROPOFOL N/A 10/04/2019   Procedure: ESOPHAGOGASTRODUODENOSCOPY (EGD) WITH PROPOFOL;  Surgeon: Mauri Pole, MD;  Location: [facility];  Service: Endoscopy;  Laterality: N/A;   GIVENS CAPSULE STUDY N/A 09/17/2019   Procedure: GIVENS CAPSULE STUDY;  Surgeon: [name], MD;  Location: [place];  Service: Endoscopy;  Laterality: N/A;   HOT HEMOSTASIS N/A 08/14/2019   Procedure: HOT HEMOSTASIS (ARGON PLASMA COAGULATION/BICAP);  Surgeon: [name], MD;  [facility] END[facility]COPY;  Service: Gastroenterology;  Laterality: N/A;   HOT HEMOSTASIS N/A 09/17/2019   Procedure: HOT HEMOSTASIS (ARGON PLASMA COAGULATION/BICAP);  Surgeon: [name], MD;  Location: [place];  Service: Endoscopy;  Laterality: N/A;   PARTIAL COLECTOMY  2003   To address colon cancer   PATELLAR TENDON REPAIR Right 09/30/2018   Procedure: PATELLA TENDON REPAIR;  Surgeon: Altamese [place], MD;  Location: [place];  Service: Orthopedics;  Laterality: Right;   POLYPECTOMY  09/17/2019   Procedure: POLYPECTOMY;  Surgeon: [name], MD;  Location: [facility] ENDOSCOPY;  Service: Endoscopy;;       Family History  Problem Relation Age of Onset   Colon cancer Father    CAD Other    Colon cancer Paternal Uncle    Colon cancer Paternal Uncle    Diabetes Neg Hx     Social History   Tobacco Use   Smoking status: Former    Pack years: 0.00    Types: Cigarettes    Quit date: 1990    Years since quitting: 32.5   Smokeless tobacco: Never  Vaping Use   Vaping Use: Never used  Substance Use Topics   Alcohol use: Not Currently    Comment: 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   Drug use: Never    Home Medications Prior to Admission medications   Medication Sig Start Date  End Date Taking? Authorizing Provider  acetaminophen (TYLENOL) 500 MG tablet Take 1,000-1,500 mg by mouth every 6 (six) hours as needed for moderate pain.   Yes [provider]  carvedilol (COREG) 25 MG tablet Take 25 mg by mouth 2 (two) times daily with a meal.   Yes [provider]  finasteride (PROSCAR) 5 MG tablet Take 1 tablet (5 mg total) by mouth daily. 06/05/20  Yes Dagar, Meredith Staggers, MD  furosemide (LASIX) 20 MG tablet Take 20 mg by mouth 2 (two) times daily. 12/26/20  Yes [provider]  hydrOXYzine (ATARAX/VISTARIL) 25 MG tablet Take 1 tablet (25 mg total) by mouth daily. 06/04/20  Yes Dagar, Meredith Staggers, MD  metFORMIN (GLUCOPHAGE-XR) 500 MG 24 hr tablet Take 500 mg by mouth daily with breakfast.  02/18/18  Yes [provider]  nystatin (MYCOSTATIN/NYSTOP) powder APPLY TOPICALLY TWO TIMES DAILY FOR  7 DAYS. Patient taking differently: Apply 1 application topically daily as needed (irritation). 06/08/20 06/08/21 Yes Dagar, Meredith Staggers, MD  pantoprazole (PROTONIX) 40 MG tablet Take 1 tablet (40 mg total) by mouth daily. 11/08/19 02/03/21 Yes Esterwood, Amy S, PA-C  polyethylene glycol (MIRALAX / GLYCOLAX) packet Take 17 g by mouth 2 (two) times daily. Patient taking differently: Take 17 g by mouth daily as needed for mild constipation. Mix in coffee and drink 10/08/18  Yes [name] C, DO  pravastatin (PRAVACHOL) 10 MG tablet Take 10 mg by mouth at bedtime. 02/18/18  Yes [provider]  predniSONE (DELTASONE) 5 MG tablet TAKE 2 TABLETS (10 MG TOTAL) BY MOUTH DAILY WITH BREAKFAST FOR 1 DAY. Patient taking differently: Take 5 mg by mouth daily with breakfast. 06/08/20 06/08/21 Yes Dagar, Meredith Staggers, MD  senna-docusate (SENOKOT-S) 8.6-50 MG tablet Take 1 tablet by mouth 2 (two) times daily. Patient taking differently: Take 1 tablet by mouth daily as needed for mild constipation. 10/08/18  Yes [name], DO  spironolactone (ALDACTONE) 50 MG tablet Take 50 mg by  mouth daily.   Yes [provider]  tamsulosin (FLOMAX) 0.4 MG CAPS capsule Take 0.4 mg by mouth at bedtime. 02/18/18  Yes [provider]  triamcinolone cream (KENALOG) 0.1 % Apply 1 application topically 2 (two) times daily. 06/08/20  Yes Aslam, Loralyn [place], MD  triamcinolone ointment (KENALOG) 0.5 % APPLY 1 APPLICATION TOPICALLY TWO TIMES DAILY. Patient not taking: No sig reported 06/08/20 06/08/21  Dagar, Meredith Staggers, MD    Allergies    Patient has no known allergies.  Review of Systems   Review of Systems  Constitutional:  Negative for chills and fever.  HENT:  Negative for sinus pressure.   Respiratory:  Negative for shortness of breath.   Cardiovascular:  Negative for chest pain.  Gastrointestinal:  Negative for abdominal pain, nausea and vomiting.  Genitourinary:  Negative for flank pain.  Musculoskeletal:  Positive for arthralgias and joint swelling. Negative for back pain.  Skin:  Negative for pallor and wound.  Neurological:  Negative for headaches.  All other systems reviewed and are negative.  Physical Exam Updated Vital Signs BP (!) 164/94 (BP Location: Right Arm)   Pulse 82   Temp 97.9 F (36.6 C) (Oral)   Resp 20   SpO2 97%   Physical Exam Vitals and nursing note reviewed.  Constitutional:      Appearance: Normal appearance.  HENT:     Head: Normocephalic and atraumatic.     Nose: Nose normal.  Eyes:     Pupils: Pupils are equal, round, and reactive to light.  Cardiovascular:     Rate and Rhythm: Normal rate.  Pulmonary:     Effort: Pulmonary effort is normal.     Breath sounds: No wheezing.  Abdominal:     General: Abdomen is flat.     Tenderness: There is no abdominal tenderness. There is no right CVA tenderness or left CVA tenderness.  Musculoskeletal:        General: Swelling, tenderness and signs of injury present.     Cervical back: Normal range of motion and neck supple.     Right knee: Swelling, effusion and bony tenderness present.  Decreased range of motion. Tenderness present.       Legs:     Comments: Pain with palpation along the right knee, limited range of motion due to pain.  Unable to fully raise leg, or flex his right knee.  Pulses are present, sensation is  intact throughout.  Skin:    General: Skin is warm and dry.  Neurological:     Mental Status: He is alert and oriented to person, place, and time.    ED Results / Procedures / Treatments   Labs (all labs ordered are listed, but only abnormal results are displayed) Labs Reviewed  RESP PANEL BY RT-PCR (FLU A&B, COVID) [id number]  CBC WITH DIFFERENTIAL/PLATELET  COMPREHENSIVE METABOLIC PANEL  PROTIME-INR  MAGNESIUM  MAGNESIUM  COMPREHENSIVE METABOLIC PANEL  CBC    EKG None  Radiology DG Chest 2 View  Result Date: 02/03/2021 CLINICAL DATA:  Preoperative radiography EXAM: CHEST - 2 VIEW COMPARISON:  09/16/2019 FINDINGS: Prior sternotomy and CABG. Cardiomegaly. Tortuous and calcified aorta. Remaining cardiomediastinal contours are unremarkable. Pulmonary vascular congestion with indistinct and cephalized pulmonary vascularity. Some hazy reticular opacities are present with mild fissural thickening and few septal lines which could reflect combination of atelectasis and mild interstitial edema. No consolidative process. No pneumothorax. No effusion. Degenerative changes in the shoulders, right greater than left. Multilevel degenerative changes are present in the imaged portions of the spine. IMPRESSION: Appearance most compatible with mild CHF/volume overload with cardiomegaly, vascular congestion and hazy interstitial opacity suggestive of pulmonary edema. Aortic Atherosclerosis (ICD10-I70.0). Electronically Signed   By: Lovena Le M.D.   On: 02/03/2021 23:00   DG Pelvis 1-2 Views  Result Date: 02/03/2021 CLINICAL DATA:  Pain after fall EXAM: PELVIS - 1-2 VIEW COMPARISON:  CT August 14, 2019 FINDINGS: Diffuse demineralization of bone. There is no evidence  of pelvic fracture or diastasis. Degenerative change of the hips. Lower lumbar spondylosis. IMPRESSION: No acute displaced fracture or dislocation on this single AP view of the pelvis. Electronically Signed   By: Dahlia Bailiff MD   On: 02/03/2021 22:30   DG Knee 2 Views Right  Result Date: 02/03/2021 CLINICAL DATA:  Pain after fall EXAM: RIGHT KNEE - 1-2 VIEW COMPARISON:  Radiograph September 30, 2018 FINDINGS: Diffuse demineralization of bone. Comminuted impacted fracture of the distal femoral metadiaphysis with approximately 13 mm medial displacement and lateral angulation of the distal fracture fragment, without definite intra-articular extension. Irregularity of the upper pole of patella, related to prior avulsion fracture. Joint effusion. 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 Vascular calcifications. IMPRESSION: 1. Comminuted, displaced and angulated distal femoral metadiaphyseal fracture, without definite intra-articular extension. 2. Irregularity of the upper pole of patella, related to prior known avulsion fracture. 3. 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 Electronically Signed   By: Dahlia Bailiff MD   On: 02/03/2021 22:28   CT Head Wo Contrast  Result Date: 02/03/2021 CLINICAL DATA:  75 year old male with neck trauma. EXAM: CT HEAD WITHOUT CONTRAST CT CERVICAL SPINE WITHOUT CONTRAST TECHNIQUE: Multidetector CT imaging of the head and cervical spine was performed following the standard protocol without intravenous contrast. Multiplanar CT image reconstructions of the cervical spine were also generated. COMPARISON:  None. FINDINGS: CT HEAD FINDINGS Brain: Mild age-related atrophy and chronic microvascular ischemic changes. There is no acute intracranial hemorrhage. No mass effect or midline shift no extra-axial fluid collection. Vascular: No hyperdense vessel or unexpected calcification. Skull: Normal. Negative for fracture or focal  lesion. Sinuses/Orbits: There is complete opacification of the left maxillary sinus. The remainder of the visualized paranasal sinuses and mastoid air cells are clear. No air-fluid level. Other: None CT CERVICAL SPINE FINDINGS Alignment: No acute subluxation. There is straightening of normal cervical lordosis which may be positional or due to muscle spasm. Skull base and vertebrae:  No acute fracture. Osteopenia. Soft tissues and spinal canal: No prevertebral fluid or swelling. No visible canal hematoma. Disc levels: Multilevel degenerative changes with disc space narrowing and spurring. Upper chest: Negative. Other: Bilateral carotid bulb calcified plaques. IMPRESSION: 1. No acute intracranial pathology. Mild age-related atrophy and chronic microvascular ischemic changes. 2. No acute/traumatic cervical spine pathology. Multilevel degenerative changes. Electronically Signed   By: Anner Crete M.D.   On: 02/03/2021 22:41   CT Cervical Spine Wo Contrast  Result Date: 02/03/2021 CLINICAL DATA:  75 year old male with neck trauma. EXAM: CT HEAD WITHOUT CONTRAST CT CERVICAL SPINE WITHOUT CONTRAST TECHNIQUE: Multidetector CT imaging of the head and cervical spine was performed following the standard protocol without intravenous contrast. Multiplanar CT image reconstructions of the cervical spine were also generated. COMPARISON:  None. FINDINGS: CT HEAD FINDINGS Brain: Mild age-related atrophy and chronic microvascular ischemic changes. There is no acute intracranial hemorrhage. No mass effect or midline shift no extra-axial fluid collection. Vascular: No hyperdense vessel or unexpected calcification. Skull: Normal. Negative for fracture or focal lesion. Sinuses/Orbits: There is complete opacification of the left maxillary sinus. The remainder of the visualized paranasal sinuses and mastoid air cells are clear. No air-fluid level. Other: None CT CERVICAL SPINE FINDINGS Alignment: No acute subluxation. There is  straightening of normal cervical lordosis which may be positional or due to muscle spasm. Skull base and vertebrae: No acute fracture. Osteopenia. Soft tissues and spinal canal: No prevertebral fluid or swelling. No visible canal hematoma. Disc levels: Multilevel degenerative changes with disc space narrowing and spurring. Upper chest: Negative. Other: Bilateral carotid bulb calcified plaques. IMPRESSION: 1. No acute intracranial pathology. Mild age-related atrophy and chronic microvascular ischemic changes. 2. No acute/traumatic cervical spine pathology. Multilevel degenerative changes. Electronically Signed   By: Anner Crete M.D.   On: 02/03/2021 22:41    Procedures Procedures   Medications Ordered in ED Medications  acetaminophen (TYLENOL) tablet 650 mg (has no administration in time range)    Or  acetaminophen (TYLENOL) suppository 650 mg (has no administration in time range)  morphine 4 MG/ML injection 4 mg (has no administration in time range)  naloxone (NARCAN) injection 0.4 mg (has no administration in time range)  ondansetron (ZOFRAN) injection 4 mg (has no administration in time range)  morphine 4 MG/ML injection 4 mg (4 mg Intravenous Given 02/03/21 2209)    ED Course  I have reviewed the triage vital signs and the nursing notes.  Pertinent labs & imaging results that were available during my care of the patient were reviewed by me and considered in my medical decision making (see chart for details).    MDM Rules/Calculators/A&P  With extensive past medical history presents to the ED with a chief complaint of right knee pain, right leg pain status post mechanical fall.  Reports he was trying to ambulate with his walker on the living room carpeted floor when he suddenly felt.  There was no dizziness, no headache, no preceding symptom.  Arrived in the ED via EMS, given 100 mics of fentanyl to help with pain control.  During my evaluation right leg was placed on a splint, this  has limited range of motion due to pain.  Pulses are present, he is unable to flex the right knee, pain with palpation along the patella.  Does report multiple surgeries in the past including intervention to his right leg.  He is also on a blood thinner, denies hitting his head, or neck pain.  We will obtain CT  scan to rule out any intracranial pathology.  Given morphine, will obtain x-rays of his right knee.  Along with pelvis.  Xray of his right knee showed:  1. Comminuted, displaced and angulated distal femoral metadiaphyseal  fracture, without definite intra-articular extension.  2. Irregularity of the upper pole of patella, related to prior known  avulsion fracture.  3. 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    Xray of the pelvis showed:  No acute displaced fracture or dislocation on this single AP view of  the pelvis.      10:00 PM spoke to Dr. Griffin Basil of orthopedics, who recommended admission via hospitalist.  Patient will also be n.p.o. after midnight.  I personally order COVID testing along with blood work.  Spoke to hospitalist Dr. Glo Herring who will admit patient for further management.   I have also ordered a CT right knee for patient.  His lab work is currently pending, however this was discussed with hospitalist.  Patient informed of findings, he is agreeable of plan and management at this time  CT Head/ Cervical Spine showed:  1. No acute intracranial pathology. Mild age-related atrophy and  chronic microvascular ischemic changes.  2. No acute/traumatic cervical spine pathology. Multilevel  degenerative changes.      DG Chest showed:  Appearance most compatible with mild CHF/volume overload with  cardiomegaly, vascular congestion and hazy interstitial opacity  suggestive of pulmonary edema.    11:04 PM CT head and cervical spine are normal.  We did discuss blood thinner use, he reports he only currently takes a baby  aspirin.  His blood thinner was discontinued due to GI related issues. Patient agreeable of plan and management.    Portions of this note were generated with [employer]. Dictation errors may occur despite best attempts at proofreading.  Final Clinical Impression(s) / ED Diagnoses Final diagnoses:  Fall, initial encounter  Acute pain of right knee    Rx / DC Orders ED Discharge Orders     None        Janeece Fitting, PA-C 02/03/21 2254    Janeece Fitting, PA-C 02/03/21 2306    Lennice Sites, DO 02/03/21 2316

## 2021-02-03 NOTE — H&P (Signed)
History and Physical    PLEASE NOTE THAT DRAGON DICTATION SOFTWARE WAS USED IN THE CONSTRUCTION OF THIS NOTE.   Brandon [name] XTK:[id number] DOB: [birth year] DOA: 02/03/2021  PCP: Helane Rima, MD Patient coming from: home   I have personally briefly reviewed patient's old medical records in [place]  Chief Complaint: right leg pain  HPI: Brandon [name] is a 75 y.o. male with medical history significant for paroxysmal atrial fibrillation not chronically anticoagulation in setting of history of recurrent gastrointestinal bleeding, cirrhosis, chronic anemia with baseline hemoglobin 11-13, type 2 diabetes mellitus, who is admitted to [facility] on 02/03/2021 with acute distal right femoral fracture after presenting from home to [facility] ED for evaluation of ground-level mechanical fall.   Patient reports tripping while attempting to ambulate at home, resulting in a ground level fall during which his right leg was the principal point of contact with the floor below.  As a result of this fall, the patient reports immediate development of right thigh pain, proximal to the right knee. States that this pain has been constant since onset, with exacerbation when attempting to move the right lower extremity.  As a consequence of the associated intensity of this discomfort, reports that he is unable to bear weight on the right lower extremity at this time.  Otherwise, the patient denies any acute arthralgias or myalgias as a result of the above fall.  Denies any acute numbness or paresthesias in the bilateral lower extremities.   Did not hit head as a component of this fall, and denies any associated loss of consciousness.  Denies any preceding or associated chest pain, shortness of breath, diaphoresis, palpitations, nausea, vomiting, dizziness, presyncope, or syncope.  Denies any subsequent headache, neck pain, blurry vision, or diplopia.   He acknowledges a history of paroxysmal atrial  fibrillation, but conveys that he is not formally/chronically anticoagulated given a history of prior gastrointestinal bleeds.  Consequently, he is on only a daily baby aspirin at home, with most recent dose of this antiplatelet medication occurring on the morning of 02/03/2021.  Denies any known history of underlying coronary artery disease or congestive heart failure.  Denies any recent shortness of breath, orthopnea, or PND.  Most recent echocardiogram occurred in October 2021 and demonstrated LVEF 60 to 65%, no focal wall motion abnormalities, mild concentric LVH, inability to evaluate diastolic function, mildly dilated left atrium, mild aortic regurgitation, mild aortic stenosis associated with aortic valve area by VTI of 1.45 cm2.   Denies any subjective fever, chills, rigors, or generalized myalgias. Denies any recent headache, neck stiffness, rhinitis, rhinorrhea, sore throat, wheezing, cough, nausea, vomiting, abdominal pain, diarrhea, or rash. No recent traveling or known COVID-19 exposures. Denies dysuria, gross hematuria, or change in urinary urgency/frequency.       ED Course:  Vital signs in the ED were notable for the following: Temperature max 97.9, heart rate 82, blood pressure 164/94; respiratory rate 20, oxygen saturation 97% on room air.  Labs were notable for the following: CMP was notable for the following: Sodium 140, potassium 5.0, bicarbonate 29, creatinine 0.98, glucose 143, albumin 2.5, alkaline phosphatase 77, AST 28, ALT 16, and total bilirubin 1.0 serum magnesium level 1.5.  CBC notable for white blood cell count of 9200, hemoglobin 11.5, platelets 127.  INR 1.1.  Screening nasopharyngeal COVID-19/influenza PCR were performed in the ED this evening and found to be negative.  Plain films of the right knee showed commuted displaced and angulated distal femoral metadiaphyseal fracture without  definite intra-articular extension.  Plain films of the pelvis showed no acute  fracture or dislocation.  CT head showed no evidence of acute intracranial process, including no evidence of intracranial hemorrhage while CT cervical spine showed no evidence of acute/traumatic cervical spine pathology.  The patient's case/imaging was discussed with the on-call orthopedic surgeon, Dr.Varkey, who will formally consult and plans to take patient to the OR tomorrow (02/04/21) for definitive surgical intervention.  While in the ED, the following were administered: Morphine 4 mg IV x1.     Review of Systems: As per HPI otherwise 10 point review of systems negative.   Past Medical History:  Diagnosis Date   Anemia 07/2019   Hb 6.7 >> 3 PRBCs >> 8.8 in 07/2019.      Atrial fibrillation ([place])    Cholelithiasis    Cirrhosis of liver ([place])    Presumed due to EtOH as well as fatty liver from morbid obesity.  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  Not performed in [place] N/A 09/17/2019   Procedure: COLONOSCOPY WITH PROPOFOL;  Surgeon: [name], MD;  Location: [place];  Service: Endoscopy;  Laterality: N/A;   CORONARY ARTERY BYPASS GRAFT     in his 64s   ESOPHAGOGASTRODUODENOSCOPY (EGD) WITH PROPOFOL N/A 08/14/2019   Procedure: ESOPHAGOGASTRODUODENOSCOPY (EGD) WITH PROPOFOL;   Surgeon: [name], MD;  Location: [place];  Service: Gastroenterology;  Laterality: N/A;   ESOPHAGOGASTRODUODENOSCOPY (EGD) WITH PROPOFOL N/A 09/17/2019   Procedure: ESOPHAGOGASTRODUODENOSCOPY (EGD) WITH PROPOFOL;  Surgeon: [name], MD;  Location: [place];  Service: Endoscopy;  Laterality: N/A;   ESOPHAGOGASTRODUODENOSCOPY (EGD) WITH PROPOFOL N/A 10/04/2019   Procedure: ESOPHAGOGASTRODUODENOSCOPY (EGD) WITH PROPOFOL;  Surgeon: Mauri Pole, MD;  Location: [facility] ENDOSCOPY;  Service: Endoscopy;  Laterality: N/A;   GIVENS CAPSULE STUDY N/A 09/17/2019   Procedure: GIVENS CAPSULE STUDY;  Surgeon: [name], MD;  Location: [place];  Service: Endoscopy;  Laterality: N/A;   HOT HEMOSTASIS N/A 08/14/2019   Procedure: HOT HEMOSTASIS (ARGON PLASMA COAGULATION/BICAP);  Surgeon: [name], MD;  [facility];  Service: Gastroenterology;  Laterality: N/A;   HOT HEMOSTASIS N/A 09/17/2019   Procedure: HOT HEMOSTASIS (ARGON PLASMA COAGULATION/BICAP);  Surgeon: [name], MD;  Location: [place];  Service: Endoscopy;  Laterality: N/A;   PARTIAL COLECTOMY  2003   To address colon cancer   PATELLAR TENDON REPAIR Right 09/30/2018   Procedure: PATELLA TENDON REPAIR;  Surgeon: Altamese [place], MD;  Location: [place];  Service: Orthopedics;  Laterality: Right;   POLYPECTOMY  09/17/2019   Procedure: POLYPECTOMY;  Surgeon: [name], MD;  Location: [facility] ENDOSCOPY;  Service: Endoscopy;;    Social History:  reports that he quit smoking about 32 years  ago. His smoking use included cigarettes. He has never used smokeless tobacco. He reports previous alcohol use. He reports that he does not use drugs.   No Known Allergies  Family History  Problem Relation Age of Onset   Colon cancer Father    CAD Other    Colon cancer Paternal Uncle    Colon cancer Paternal Uncle    Diabetes Neg Hx     Family history reviewed and not pertinent    Prior to Admission medications    Medication Sig Start Date End Date Taking? Authorizing Provider  acetaminophen (TYLENOL) 500 MG tablet Take 1,000-1,500 mg by mouth every 6 (six) hours as needed for moderate pain.   Yes [provider]  carvedilol (COREG) 25 MG tablet Take 25 mg by mouth 2 (two) times daily with a meal.   Yes [provider]  finasteride (PROSCAR) 5 MG tablet Take 1 tablet (5 mg total) by mouth daily. 06/05/20  Yes Dagar, Meredith Staggers, MD  furosemide (LASIX) 20 MG tablet Take 20 mg by mouth 2 (two) times daily. 12/26/20  Yes [provider]  hydrOXYzine (ATARAX/VISTARIL) 25 MG tablet Take 1 tablet (25 mg total) by mouth daily. 06/04/20  Yes Dagar, Meredith Staggers, MD  metFORMIN (GLUCOPHAGE-XR) 500 MG 24 hr tablet Take 500 mg by mouth daily with breakfast.  02/18/18  Yes [provider]  nystatin (MYCOSTATIN/NYSTOP) powder APPLY TOPICALLY TWO TIMES DAILY FOR 7 DAYS. Patient taking differently: Apply 1 application topically daily as needed (irritation). 06/08/20 06/08/21 Yes Dagar, Meredith Staggers, MD  pantoprazole (PROTONIX) 40 MG tablet Take 1 tablet (40 mg total) by mouth daily. 11/08/19 02/03/21 Yes Esterwood, Amy S, PA-C  polyethylene glycol (MIRALAX / GLYCOLAX) packet Take 17 g by mouth 2 (two) times daily. Patient taking differently: Take 17 g by mouth daily as needed for mild constipation. Mix in coffee and drink 10/08/18  Yes [name] C, DO  pravastatin (PRAVACHOL) 10 MG tablet Take 10 mg by mouth at bedtime. 02/18/18  Yes [provider]  predniSONE (DELTASONE) 5 MG tablet TAKE 2 TABLETS (10 MG TOTAL) BY MOUTH DAILY WITH BREAKFAST FOR 1 DAY. Patient taking differently: Take 5 mg by mouth daily with breakfast. 06/08/20 06/08/21 Yes Dagar, Meredith Staggers, MD  senna-docusate (SENOKOT-S) 8.6-50 MG tablet Take 1 tablet by mouth 2 (two) times daily. Patient taking differently: Take 1 tablet by mouth daily as needed for mild constipation. 10/08/18  Yes [name], DO  spironolactone  (ALDACTONE) 50 MG tablet Take 50 mg by mouth daily.   Yes [provider]  tamsulosin (FLOMAX) 0.4 MG CAPS capsule Take 0.4 mg by mouth at bedtime. 02/18/18  Yes [provider]  triamcinolone cream (KENALOG) 0.1 % Apply 1 application topically 2 (two) times daily. 06/08/20  Yes Aslam, Loralyn Freshwater, MD  triamcinolone ointment (KENALOG) 0.5 % APPLY 1 APPLICATION TOPICALLY TWO TIMES DAILY. Patient not taking: No sig reported 06/08/20 06/08/21  DagarMeredith Staggers, MD     Objective    Physical Exam: Vitals:   02/03/21 2103 02/03/21 2110  BP:  (!) 164/94  Pulse: 82   Resp: 20   Temp: 97.9 F (36.6 C)   TempSrc: Oral   SpO2: 97%     General: appears to be stated age; alert, oriented Skin: warm, dry, no rash Head:  AT/[place] Mouth:  Oral mucosa membranes appear moist, normal dentition Neck: supple; trachea midline Heart:  RRR; did not appreciate any M/R/G Lungs: CTAB, did not appreciate any wheezes, rales, or rhonchi Abdomen: +  BS; soft, ND, NT Vascular: 2+ pedal pulses b/l; 2+ radial pulses b/l Extremities: Trace edema bilateral lower extremities ; no muscle wasting Neuro: In the setting of presenting acute distal right femoral fracture, unable to fully assess strength in the right lower extremity; within these confines, strength and sensation otherwise appear intact in the upper and lower extremities bilaterally.    Labs on Admission: I have personally reviewed following labs and imaging studies  CBC: No results for input(s): WBC, NEUTROABS, HGB, HCT, MCV, PLT in the last 168 hours. Basic Metabolic Panel: No results for input(s): NA, K, CL, CO2, GLUCOSE, BUN, CREATININE, CALCIUM, MG, PHOS in the last 168 hours. GFR: CrCl cannot be calculated (Patient's most recent lab result is older than the maximum 21 days allowed.). Liver Function Tests: No results for input(s): AST, ALT, ALKPHOS, BILITOT, PROT, ALBUMIN in the last 168 hours. No results for input(s): LIPASE, AMYLASE in  the last 168 hours. No results for input(s): AMMONIA in the last 168 hours. Coagulation Profile: No results for input(s): INR, PROTIME in the last 168 hours. Cardiac Enzymes: No results for input(s): CKTOTAL, CKMB, CKMBINDEX, TROPONINI in the last 168 hours. BNP (last 3 results) No results for input(s): PROBNP in the last 8760 hours. HbA1C: No results for input(s): HGBA1C in the last 72 hours. CBG: No results for input(s): GLUCAP in the last 168 hours. Lipid Profile: No results for input(s): CHOL, HDL, LDLCALC, TRIG, CHOLHDL, LDLDIRECT in the last 72 hours. Thyroid Function Tests: No results for input(s): TSH, T4TOTAL, FREET4, T3FREE, THYROIDAB in the last 72 hours. Anemia Panel: No results for input(s): VITAMINB12, FOLATE, FERRITIN, TIBC, IRON, RETICCTPCT in the last 72 hours. Urine analysis:    Component Value Date/Time   COLORURINE YELLOW 06/06/2020 0530   APPEARANCEUR CLOUDY (A) 06/06/2020 0530   LABSPEC 1.020 06/06/2020 0530   PHURINE 5.5 06/06/2020 0530   GLUCOSEU >=500 (A) 06/06/2020 0530   HGBUR MODERATE (A) 06/06/2020 0530   BILIRUBINUR NEGATIVE 06/06/2020 0530   KETONESUR NEGATIVE 06/06/2020 0530   PROTEINUR NEGATIVE 06/06/2020 0530   NITRITE NEGATIVE 06/06/2020 0530   LEUKOCYTESUR MODERATE (A) 06/06/2020 0530    Radiological Exams on Admission: DG Pelvis 1-2 Views  Result Date: 02/03/2021 CLINICAL DATA:  Pain after fall EXAM: PELVIS - 1-2 VIEW COMPARISON:  CT August 14, 2019 FINDINGS: Diffuse demineralization of bone. There is no evidence of pelvic fracture or diastasis. Degenerative change of the hips. Lower lumbar spondylosis. IMPRESSION: No acute displaced fracture or dislocation on this single AP view of the pelvis. Electronically Signed   By: Dahlia Bailiff MD   On: 02/03/2021 22:30   DG Knee 2 Views Right  Result Date: 02/03/2021 CLINICAL DATA:  Pain after fall EXAM: RIGHT KNEE - 1-2 VIEW COMPARISON:  Radiograph September 30, 2018 FINDINGS: Diffuse  demineralization of bone. Comminuted impacted fracture of the distal femoral metadiaphysis with approximately 13 mm medial displacement and lateral angulation of the distal fracture fragment, without definite intra-articular extension. Irregularity of the upper pole of patella, related to prior avulsion fracture. Joint effusion. 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 Vascular calcifications. IMPRESSION: 1. Comminuted, displaced and angulated distal femoral metadiaphyseal fracture, without definite intra-articular extension. 2. Irregularity of the upper pole of patella, related to prior known avulsion fracture. 3. 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 Electronically Signed   By: Dahlia Bailiff MD   On: 02/03/2021 22:28   CT Head Wo Contrast  Result Date: 02/03/2021 CLINICAL DATA:  75 year old male with neck trauma.  EXAM: CT HEAD WITHOUT CONTRAST CT CERVICAL SPINE WITHOUT CONTRAST TECHNIQUE: Multidetector CT imaging of the head and cervical spine was performed following the standard protocol without intravenous contrast. Multiplanar CT image reconstructions of the cervical spine were also generated. COMPARISON:  None. FINDINGS: CT HEAD FINDINGS Brain: Mild age-related atrophy and chronic microvascular ischemic changes. There is no acute intracranial hemorrhage. No mass effect or midline shift no extra-axial fluid collection. Vascular: No hyperdense vessel or unexpected calcification. Skull: Normal. Negative for fracture or focal lesion. Sinuses/Orbits: There is complete opacification of the left maxillary sinus. The remainder of the visualized paranasal sinuses and mastoid air cells are clear. No air-fluid level. Other: None CT CERVICAL SPINE FINDINGS Alignment: No acute subluxation. There is straightening of normal cervical lordosis which may be positional or due to muscle spasm. Skull base and vertebrae: No acute fracture. Osteopenia. Soft  tissues and spinal canal: No prevertebral fluid or swelling. No visible canal hematoma. Disc levels: Multilevel degenerative changes with disc space narrowing and spurring. Upper chest: Negative. Other: Bilateral carotid bulb calcified plaques. IMPRESSION: 1. No acute intracranial pathology. Mild age-related atrophy and chronic microvascular ischemic changes. 2. No acute/traumatic cervical spine pathology. Multilevel degenerative changes. Electronically Signed   By: Anner Crete M.D.   On: 02/03/2021 22:41   CT Cervical Spine Wo Contrast  Result Date: 02/03/2021 CLINICAL DATA:  75 year old male with neck trauma. EXAM: CT HEAD WITHOUT CONTRAST CT CERVICAL SPINE WITHOUT CONTRAST TECHNIQUE: Multidetector CT imaging of the head and cervical spine was performed following the standard protocol without intravenous contrast. Multiplanar CT image reconstructions of the cervical spine were also generated. COMPARISON:  None. FINDINGS: CT HEAD FINDINGS Brain: Mild age-related atrophy and chronic microvascular ischemic changes. There is no acute intracranial hemorrhage. No mass effect or midline shift no extra-axial fluid collection. Vascular: No hyperdense vessel or unexpected calcification. Skull: Normal. Negative for fracture or focal lesion. Sinuses/Orbits: There is complete opacification of the left maxillary sinus. The remainder of the visualized paranasal sinuses and mastoid air cells are clear. No air-fluid level. Other: None CT CERVICAL SPINE FINDINGS Alignment: No acute subluxation. There is straightening of normal cervical lordosis which may be positional or due to muscle spasm. Skull base and vertebrae: No acute fracture. Osteopenia. Soft tissues and spinal canal: No prevertebral fluid or swelling. No visible canal hematoma. Disc levels: Multilevel degenerative changes with disc space narrowing and spurring. Upper chest: Negative. Other: Bilateral carotid bulb calcified plaques. IMPRESSION: 1. No acute  intracranial pathology. Mild age-related atrophy and chronic microvascular ischemic changes. 2. No acute/traumatic cervical spine pathology. Multilevel degenerative changes. Electronically Signed   By: Anner Crete M.D.   On: 02/03/2021 22:41     EKG: Independently reviewed, with result as described above.    Assessment/Plan   Brandon [name] is a 75 y.o. male with medical history significant for paroxysmal atrial fibrillation not chronically anticoagulation in setting of history of recurrent gastrointestinal bleeding, cirrhosis, chronic anemia with baseline hemoglobin 11-13, type 2 diabetes mellitus, who is admitted to [facility] on 02/03/2021 with acute distal right femoral fracture after presenting from home to [facility] ED for evaluation of ground-level mechanical fall.    Principal Problem:   Stress fracture, right femur, initial encounter for fracture Active Problems:   DM (diabetes mellitus), type 2 ([place])   Atrial fibrillation ([place])   Hypomagnesemia   Fall at home, initial encounter   Cirrhosis of liver ([place])   Anemia      #) Acute distal right femoral fracture:  confirmed via presenting plain films and CT scan, and stemming from ground level mechanical fall without associated loss of consciousness that occurred earlier on the day of admission, as further described above, resulting in immediate develop of acute right thigh pain.  The patient's case/imaging were discussed with the on-call orthopedic surgeon, Dr. Griffin Basil,  who recommended admission to the hospitalist service for further evaluation and management of acute distal right femoral fracture, including preoperative medical optimization, and plans to take pt to the OR tomorrow (02/04/21) for definitive surgical management. Consistent with this, orthopedic surgery requests that the pt be made NPO at MN. At this time, the right lower extremity is neurovascularly intact, and the patient reports adequate pain control.  The patient is  on a daily baby aspirin, with most recent dose occurring on the morning of 02/03/2021, otherwise on no blood thinners at home, even in the context of history of paroxysmal atrial fibrillation, as further detailed above. Gupta Score for this patient in the context of anticipated aforementioned orthopedic surgery conveys a 3.57% perioperative risk for significant cardiac event.  Preoperative EKG has been ordered, with result currently pending at this time.  Additionally, INR found to be 1.1.   Plan: Formal orthopedic surgery consult for definitive surgical management, with plan to take patient to the OR tomorrow (02/04/2021). NPO after MN in anticipation of this procedure.  No pharmacologic anticoagulation leading up to this anticipated surgery.  Hold home daily baby aspirin.  SCD's. Prn IV morphine. Anticipate postoperative PT consult. repeat INR in the AM.  Pre-op EKG has been ordered. Check 25-hydroxy vit D level.  In the setting of presenting chest x-ray showing evidence of mild pulmonary vascular congestion in the absence of overt evidence of decompensated heart failure in the context of underlying cirrhosis and missing this evening's dose of Lasix, with anticipation that the patient will miss his next dose of Lasix 20 mg p.o. twice daily given current n.p.o. status, will provide Lasix 40 mg IV x1 dose now.      #) Ground level mechanical fall: The patient reports a ground level mechanical fall earlier today in which he tripped without any associated loss of consciousness. Appears to be purely mechanical in nature, without clinical evidence at this time to suggest contributory dizziness, presyncope, syncope, or acute ischemic CVA. Does not appear to have hit head as component of this fall. presenting CT head showed no evidence of acute intracranial process, including no evidence of intracranial hemorrhage, and presentation does not appear to be associated any acute neurologic deficits.  While this fall  appears to be purely mechanical in nature, will also check urinalysis to evaluate for any underlying infectious contribution.   Plan: Check urinalysis, as above.  Repeat BMP and CBC with differential in the morning. Fall precautions. Anticipate postoperative PT consult.       #) Hypomagnesemia: Presenting serum mag, mildly low at 1.5.  Plan: Magnesium sulfate 2 g IV every 2 hours x1 dose now.  Repeat serum magnesium level in the morning.       #) Paroxysmal atrial fibrillation: Documented history of such. In the setting of a CHA2DS2-VASc score of 5, there is an indication for the patient to be on chronic anticoagulation for thromboembolic prophylaxis.  However, in setting of history of cirrhosis as well as prior gastrointestinal bleeds, the patient is not formally anticoagulated on a chronic basis, rather he is on a daily baby aspirin at home. Home AV nodal blocking regimen: Coreg.  Most recent echocardiogram  performed in October 2021, with results as further detailed above.  Preoperative EKG ordered, with result currently pending, as further detailed above.   Plan: monitor strict I's & O's and daily weights. Repeat BMP and CBC in the morning.  Provide IV serum magnesium supplementation, as above, with repeat serum magnesium level ordered for the morning.  Holding home Coreg for now in the setting of current n.p.o. status, with plan for postoperative resumption of such for associated mortality benefit.  Hold home daily baby aspirin for now in the setting of current n.p.o. status as well as plan for orthopedic surgical intervention tomorrow.       #) Cirrhosis: Documented history of such.  Per chart review, there is suspicion of potential multifactorial contributions, with the prior history of alcohol abuse as well as potential [place].  Patient confirms no recent alcohol consumption.  Complicated by portal hypertension on Lasix and spironolactone as an outpatient.  Associated meld score of 7  associated with 1.9% 85-month mortality.  As the patient missed his evening dose of scheduled Lasix, with anticipation of missing tomorrow morning's dose of Lasix as well in the context of n.p.o. status in preparation for tomorrow's orthopedic surgical intervention, will provide Lasix 40 mg IV x1 now, with close ensuing monitoring of volume status including strict I's and O's and daily weights.  Of note, presenting INR found to be 1.1.  Plan: Holding home scheduled Lasix and spironolactone for now in the setting of n.p.o. status, as above.  Lasix 40 mg IV x1.  Monitor strict I's and O's and daily weights.  Repeat CMP and INR in the morning.       #) Type 2 diabetes mellitus: On metformin as a sole outpatient oral hypoglycemic agent.  Not on any insulin at home.  Presenting blood sugar noted to be 143.  Plan: Hold home metformin during this hospitalization.  Accu-Cheks every 6 hours with low-dose sliding scale insulin in the setting of current n.p.o. status.      #) BPH: Documented history of such on both tamsulosin and finasteride as an outpatient.  Plan: Holding home finasteride/tamsulosin for now in setting of current n.p.o. status.  Monitor strict I's and O's and daily weights.  Repeat BMP in the morning.      DVT prophylaxis: scd's  Code Status: DNR Family Communication: none Disposition Plan: Per Rounding Team Consults called: Case/imaging discussed with on-call orthopedic surgeon, Dr. Griffin Basil, as further detailed above;   Admission status: Inpatient; MedSurg     Of note, this patient was added by me to the following Admit List/Treatment Team: mcadmits.      PLEASE NOTE THAT DRAGON DICTATION SOFTWARE WAS USED IN THE CONSTRUCTION OF THIS NOTE.   [facility] Pager [phone] From [place]  Otherwise, please contact night-coverage  [email or web address] Password [facility]   02/03/2021, 10:51 PM

## 2021-02-03 NOTE — ED Provider Notes (Signed)
I personally evaluated the patient during the encounter and completed a history, physical, procedures, medical decision making to contribute to the overall care of the patient and decision making for the patient briefly, the patient is a 75 y.o. male after mechanical fall.  Has pain to the right knee, thigh.  Patient with morbid obesity, heart failure, high cholesterol, diabetes.  He is tender over the distal right thigh.  X-ray showed comminuted and displaced and angulated distal femoral fracture.  Otherwise images are unremarkable.  To be admitted to medicine.  Orthopedics has been consulted and to repair.  This chart was dictated using voice recognition software.  Despite best efforts to proofread,  errors can occur which can change the documentation meaning.    EKG Interpretation None             Lennice Sites, DO 02/03/21 2308

## 2021-02-03 NOTE — ED Triage Notes (Addendum)
Pt brought to ED by [facility] via stretcher with c/o pain to right knee after mechanical fall this evening. Pt placed in leg brace by EMS. Pt also takes blood thinners but denies hitting head  during fall.

## 2021-02-04 ENCOUNTER — Other Ambulatory Visit: Payer: Self-pay

## 2021-02-04 ENCOUNTER — Inpatient Hospital Stay (HOSPITAL_COMMUNITY): Payer: Medicare Other

## 2021-02-04 ENCOUNTER — Encounter (HOSPITAL_COMMUNITY)
Admission: EM | Disposition: A | Discharge status: Skilled Nursing Facility | Payer: Self-pay | Source: Home / Self Care | Attending: Internal Medicine

## 2021-02-04 ENCOUNTER — Inpatient Hospital Stay (HOSPITAL_COMMUNITY): Payer: Medicare Other | Admitting: Anesthesiology

## 2021-02-04 ENCOUNTER — Encounter (HOSPITAL_COMMUNITY): Payer: Self-pay | Admitting: Internal Medicine

## 2021-02-04 DIAGNOSIS — K7460 Unspecified cirrhosis of liver: Secondary | ICD-10-CM | POA: Diagnosis present

## 2021-02-04 DIAGNOSIS — W19XXXA Unspecified fall, initial encounter: Secondary | ICD-10-CM

## 2021-02-04 DIAGNOSIS — Y92009 Unspecified place in unspecified non-institutional (private) residence as the place of occurrence of the external cause: Secondary | ICD-10-CM

## 2021-02-04 HISTORY — PX: ORIF FEMUR FRACTURE: SHX2119

## 2021-02-04 LAB — CBC
HCT: 43.4 % (ref 39.0–52.0)
HCT: 36.9 % — ABNORMAL LOW (ref 39.0–52.0)
Hemoglobin: 13.3 g/dL (ref 13.0–17.0)
Hemoglobin: 11.3 g/dL — ABNORMAL LOW (ref 13.0–17.0)
MCH: 29.4 pg (ref 26.0–34.0)
MCH: 29.4 pg (ref 26.0–34.0)
MCHC: 30.6 g/dL (ref 30.0–36.0)
MCHC: 30.6 g/dL (ref 30.0–36.0)
MCV: 96 fL (ref 80.0–100.0)
MCV: 95.8 fL (ref 80.0–100.0)
Platelets: 139 10*3/uL — ABNORMAL LOW (ref 150–400)
Platelets: 139 10*3/uL — ABNORMAL LOW (ref 150–400)
RBC: 4.52 MIL/uL (ref 4.22–5.81)
RBC: 3.85 MIL/uL — ABNORMAL LOW (ref 4.22–5.81)
RDW: 14.4 % (ref 11.5–15.5)
RDW: 14.6 % (ref 11.5–15.5)
WBC: 11.9 10*3/uL — ABNORMAL HIGH (ref 4.0–10.5)
WBC: 11 10*3/uL — ABNORMAL HIGH (ref 4.0–10.5)
nRBC: 0 % (ref 0.0–0.2)
nRBC: 0 % (ref 0.0–0.2)

## 2021-02-04 LAB — COMPREHENSIVE METABOLIC PANEL
ALT: 16 U/L (ref 0–44)
ALT: 19 U/L (ref 0–44)
AST: 28 U/L (ref 15–41)
AST: 21 U/L (ref 15–41)
Albumin: 2.5 g/dL — ABNORMAL LOW (ref 3.5–5.0)
Albumin: 3 g/dL — ABNORMAL LOW (ref 3.5–5.0)
Alkaline Phosphatase: 77 U/L (ref 38–126)
Alkaline Phosphatase: 95 U/L (ref 38–126)
Anion gap: 6 (ref 5–15)
Anion gap: 9 (ref 5–15)
BUN: 18 mg/dL (ref 8–23)
BUN: 18 mg/dL (ref 8–23)
CO2: 29 mmol/L (ref 22–32)
CO2: 29 mmol/L (ref 22–32)
Calcium: 8.6 mg/dL — ABNORMAL LOW (ref 8.9–10.3)
Calcium: 9.6 mg/dL (ref 8.9–10.3)
Chloride: 105 mmol/L (ref 98–111)
Chloride: 100 mmol/L (ref 98–111)
Creatinine, Ser: 0.98 mg/dL (ref 0.61–1.24)
Creatinine, Ser: 1.09 mg/dL (ref 0.61–1.24)
GFR, Estimated: >60 mL/min (ref >60)
GFR, Estimated: >60 mL/min (ref >60)
Glucose, Bld: 143 mg/dL — ABNORMAL HIGH (ref 70–99)
Glucose, Bld: 172 mg/dL — ABNORMAL HIGH (ref 70–99)
Potassium: 5 mmol/L (ref 3.5–5.1)
Potassium: 4.6 mmol/L (ref 3.5–5.1)
Sodium: 140 mmol/L (ref 135–145)
Sodium: 138 mmol/L (ref 135–145)
Total Bilirubin: 1 mg/dL (ref 0.3–1.2)
Total Bilirubin: 1.2 mg/dL (ref 0.3–1.2)
Total Protein: 5.2 g/dL — ABNORMAL LOW (ref 6.5–8.1)
Total Protein: 6.5 g/dL (ref 6.5–8.1)

## 2021-02-04 LAB — GLUCOSE, CAPILLARY
Glucose-Capillary: 171 mg/dL — ABNORMAL HIGH (ref 70–99)
Glucose-Capillary: 141 mg/dL — ABNORMAL HIGH (ref 70–99)
Glucose-Capillary: 135 mg/dL — ABNORMAL HIGH (ref 70–99)
Glucose-Capillary: 132 mg/dL — ABNORMAL HIGH (ref 70–99)
Glucose-Capillary: 123 mg/dL — ABNORMAL HIGH (ref 70–99)

## 2021-02-04 LAB — URINALYSIS, ROUTINE W REFLEX MICROSCOPIC
Bilirubin Urine: NEGATIVE
Glucose, UA: NEGATIVE mg/dL
Hgb urine dipstick: NEGATIVE
Ketones, ur: NEGATIVE mg/dL
Nitrite: NEGATIVE
Protein, ur: 100 mg/dL — AB
Specific Gravity, Urine: 1.011 (ref 1.005–1.030)
WBC, UA: >50 WBC/hpf — ABNORMAL HIGH (ref 0–5)
pH: 6 (ref 5.0–8.0)

## 2021-02-04 LAB — CBC WITH DIFFERENTIAL/PLATELET
Abs Immature Granulocytes: 0.05 10*3/uL (ref 0.00–0.07)
Basophils Absolute: 0.1 10*3/uL (ref 0.0–0.1)
Basophils Relative: 1 %
Eosinophils Absolute: 0.2 10*3/uL (ref 0.0–0.5)
Eosinophils Relative: 2 %
HCT: 38.9 % — ABNORMAL LOW (ref 39.0–52.0)
Hemoglobin: 11.5 g/dL — ABNORMAL LOW (ref 13.0–17.0)
Immature Granulocytes: 1 %
Lymphocytes Relative: 7 %
Lymphs Abs: 0.7 10*3/uL (ref 0.7–4.0)
MCH: 28.6 pg (ref 26.0–34.0)
MCHC: 29.6 g/dL — ABNORMAL LOW (ref 30.0–36.0)
MCV: 96.8 fL (ref 80.0–100.0)
Monocytes Absolute: 0.9 10*3/uL (ref 0.1–1.0)
Monocytes Relative: 9 %
Neutro Abs: 7.3 10*3/uL (ref 1.7–7.7)
Neutrophils Relative %: 80 %
Platelets: 127 10*3/uL — ABNORMAL LOW (ref 150–400)
RBC: 4.02 MIL/uL — ABNORMAL LOW (ref 4.22–5.81)
RDW: 14.4 % (ref 11.5–15.5)
WBC: 9.2 10*3/uL (ref 4.0–10.5)
nRBC: 0 % (ref 0.0–0.2)

## 2021-02-04 LAB — CREATININE, SERUM
Creatinine, Ser: 1.2 mg/dL (ref 0.61–1.24)
GFR, Estimated: >60 mL/min (ref >60)

## 2021-02-04 LAB — RESP PANEL BY RT-PCR (FLU A&B, COVID) ARPGX2
Influenza A by PCR: NEGATIVE
Influenza B by PCR: NEGATIVE
SARS Coronavirus 2 by RT PCR: NEGATIVE

## 2021-02-04 LAB — PROTIME-INR
INR: 1.1 (ref 0.8–1.2)
INR: 1.1 (ref 0.8–1.2)
Prothrombin Time: 14 seconds (ref 11.4–15.2)
Prothrombin Time: 14.3 seconds (ref 11.4–15.2)

## 2021-02-04 LAB — VITAMIN D 25 HYDROXY (VIT D DEFICIENCY, FRACTURES)
Vit D, 25-Hydroxy: 40.66 ng/mL (ref 30–100)
Vit D, 25-Hydroxy: 38.12 ng/mL (ref 30–100)

## 2021-02-04 LAB — MAGNESIUM
Magnesium: 1.5 mg/dL — ABNORMAL LOW (ref 1.7–2.4)
Magnesium: 2 mg/dL (ref 1.7–2.4)

## 2021-02-04 IMAGING — DX DG KNEE 1-2V PORT*R*
1 series · 3 of 3 positions shown · non-contrast
Comparison: [DATE]

CLINICAL DATA: Distal right femur ORIF

EXAM:
PORTABLE RIGHT KNEE - 1-2 VIEW

[Series 1: knee · 0.14mm/px · 3 of 3 slices shown]
[im 1/3]
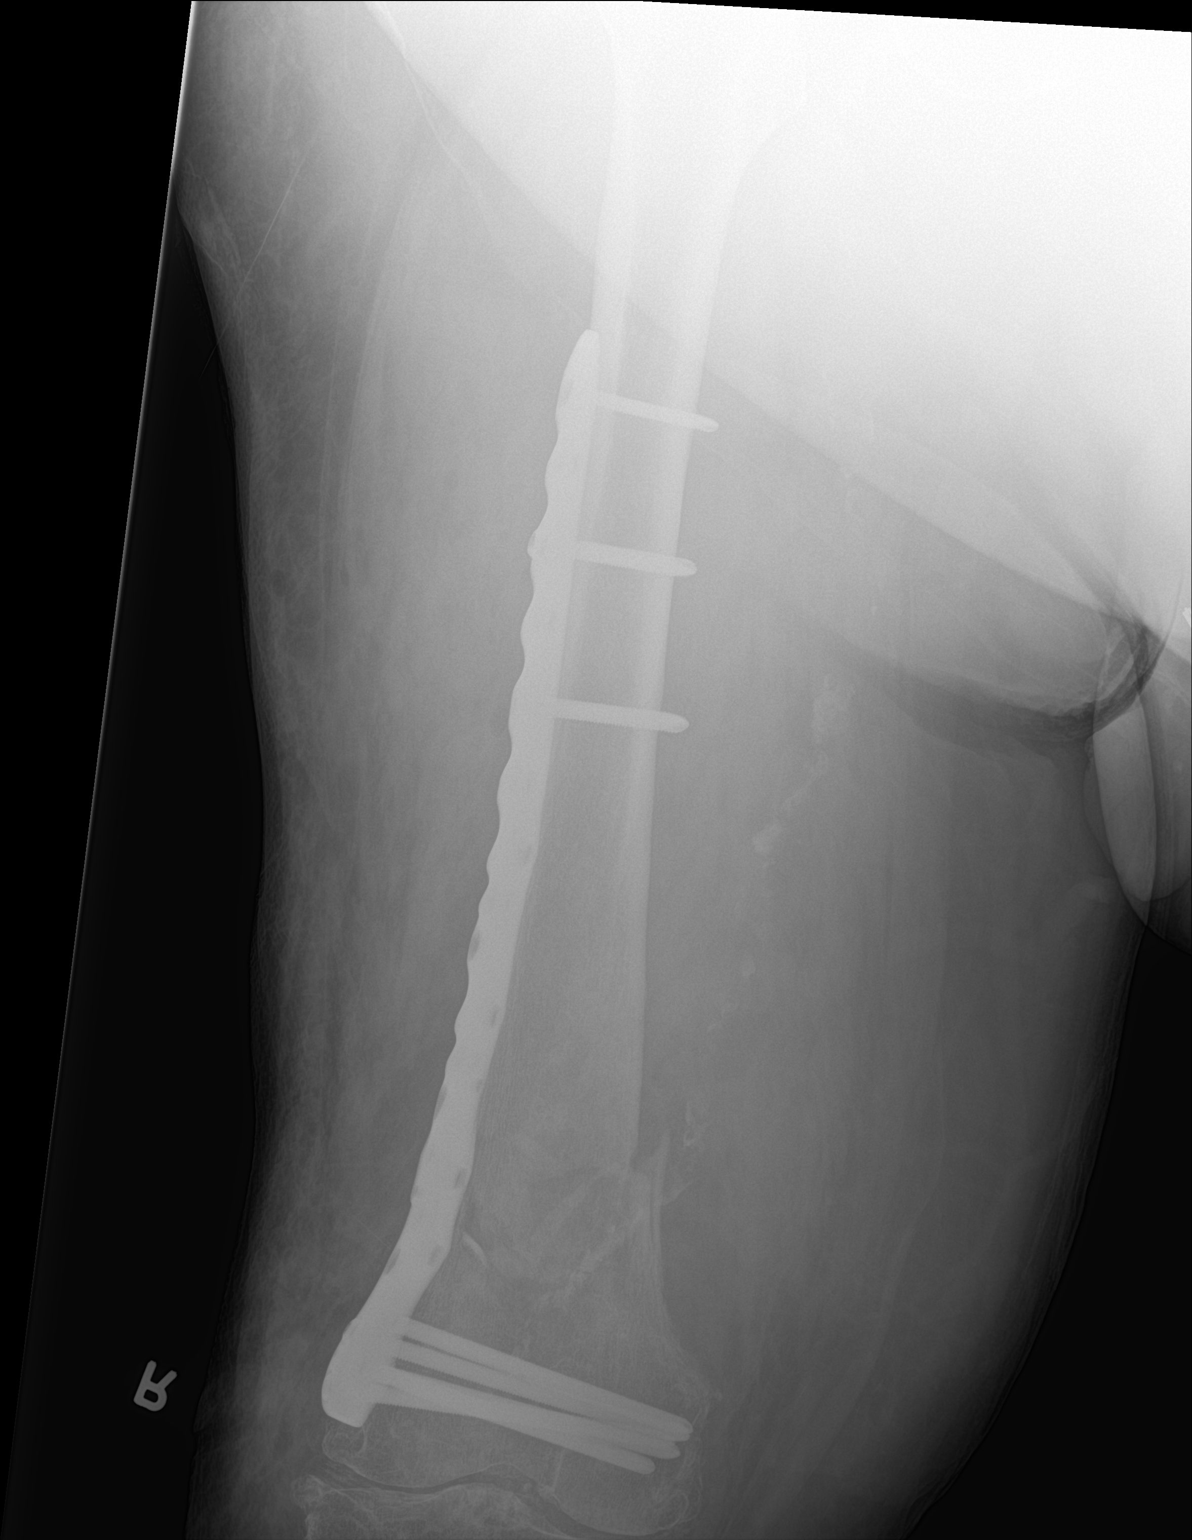
[im 2/3]
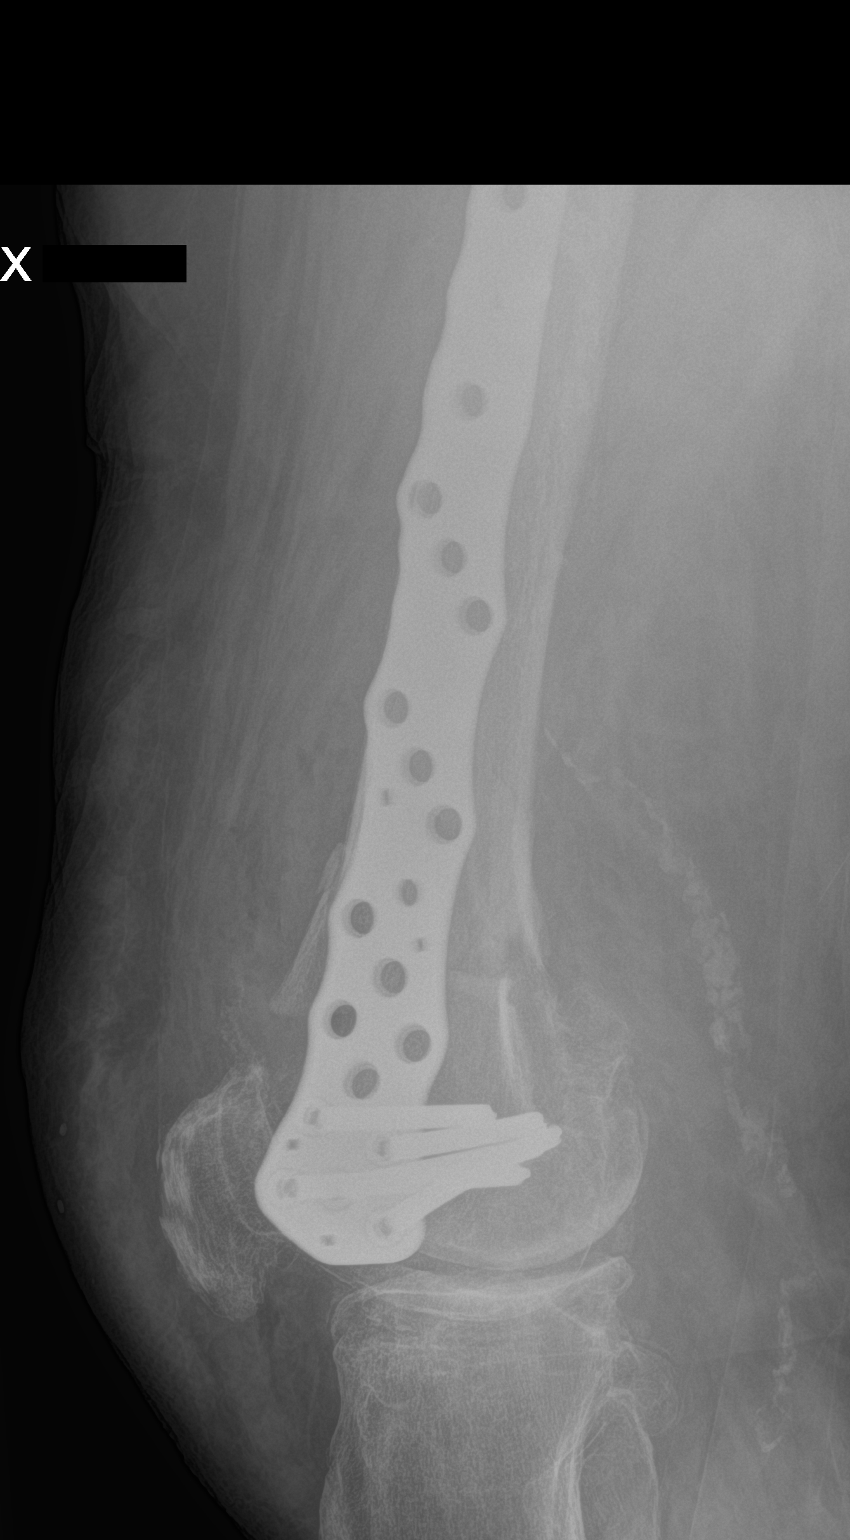
[im 3/3]
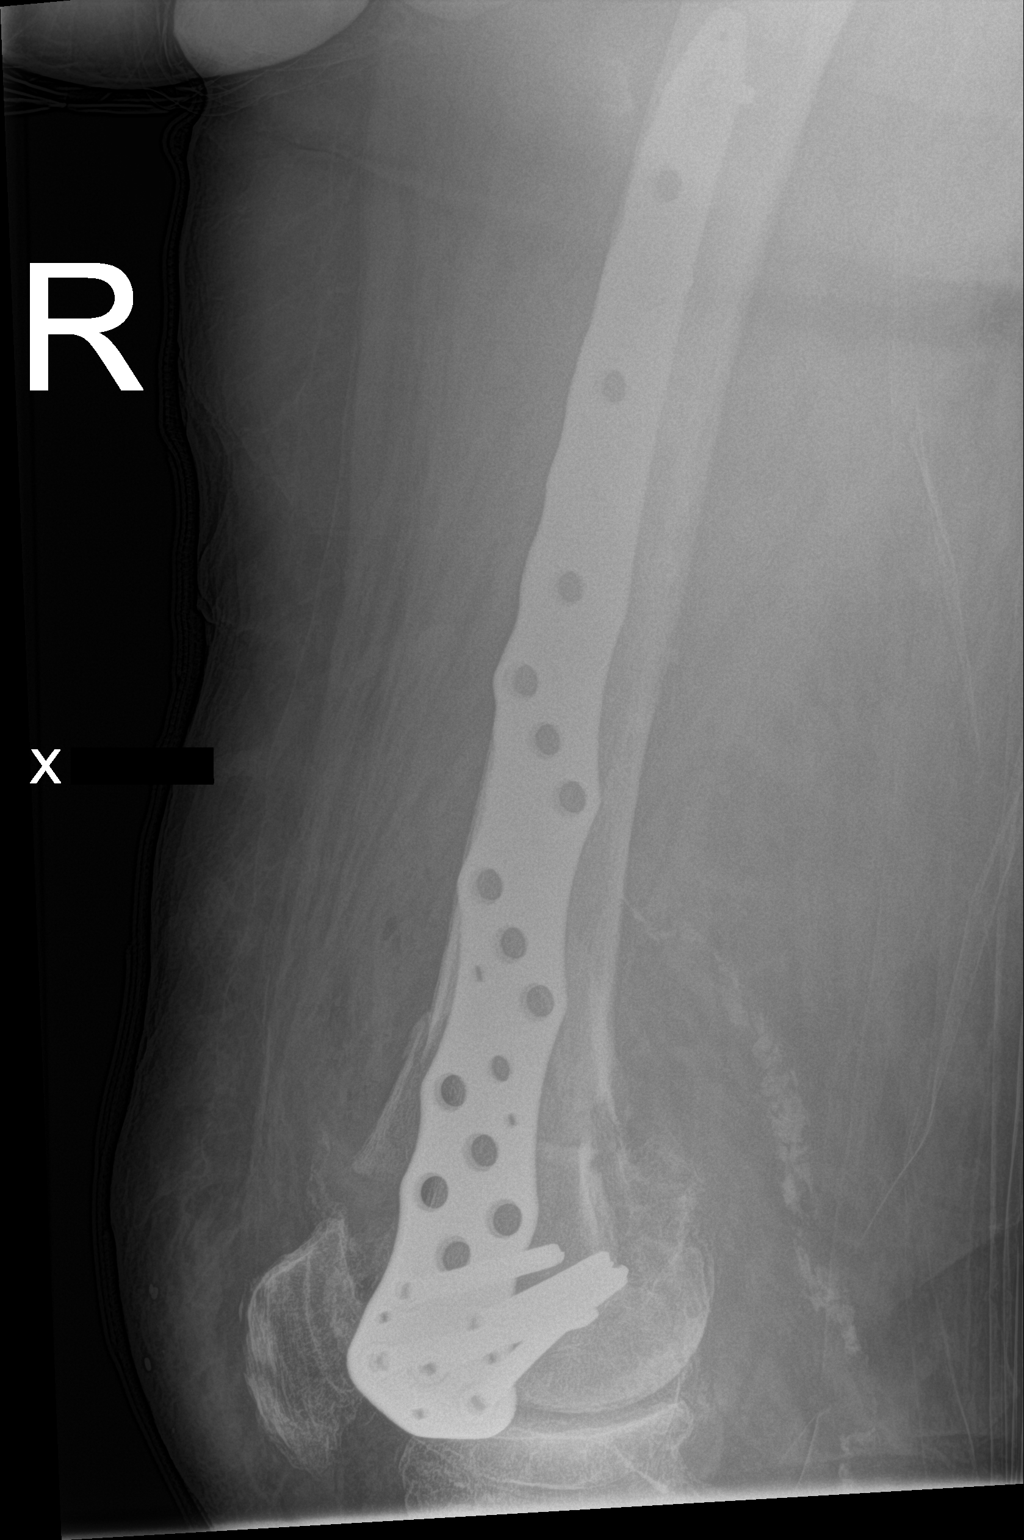

[3 of 3 positions shown; findings below may reference images not displayed]

FINDINGS: Interval ORIF of a distal femoral metaphyseal fracture with lateral
sideplate and screw fixation construct. Femoral fracture alignment
is near anatomic. Mildly displaced patellar fracture appears
unchanged in alignment. Severe tricompartmental osteoarthritis of
the right knee. Expected postoperative changes within the overlying
soft tissues. Extensive vascular calcification.
IMPRESSION: Interval ORIF of a distal femoral metaphyseal fracture, in near
anatomic alignment.

## 2021-02-04 IMAGING — RF DG FEMUR 2+V*R*
1 series · 7 of 7 positions shown · non-contrast
Comparison: Right femur radiograph dated [DATE].

CLINICAL DATA: 75-year-old male with ORIF of the right femur.

EXAM:
RIGHT FEMUR 2 VIEWS; DG C-ARM 1-60 MIN

[Series 1: run · 7 of 7 slices shown]
[im 1/7]
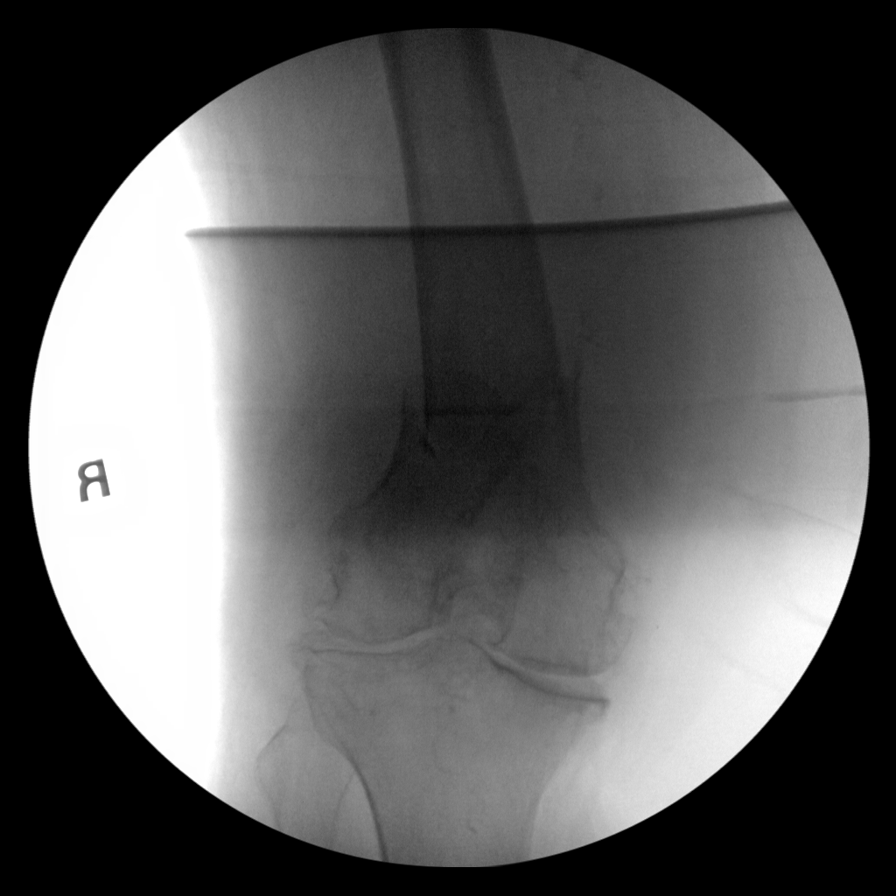
[im 2/7]
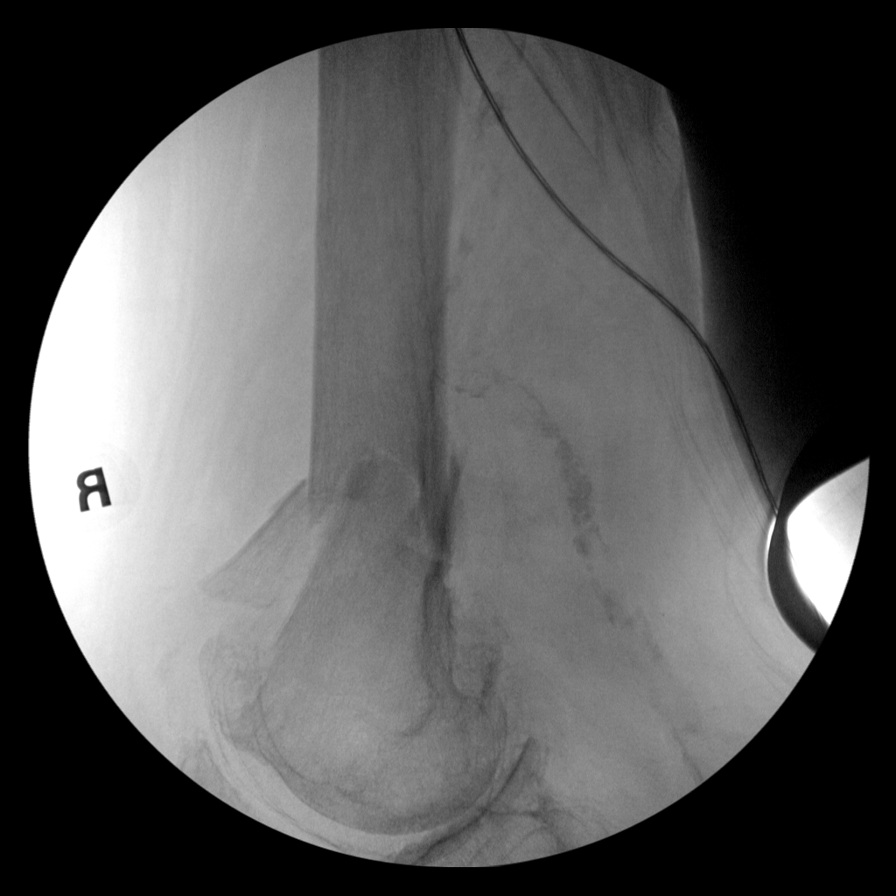
[im 3/7]
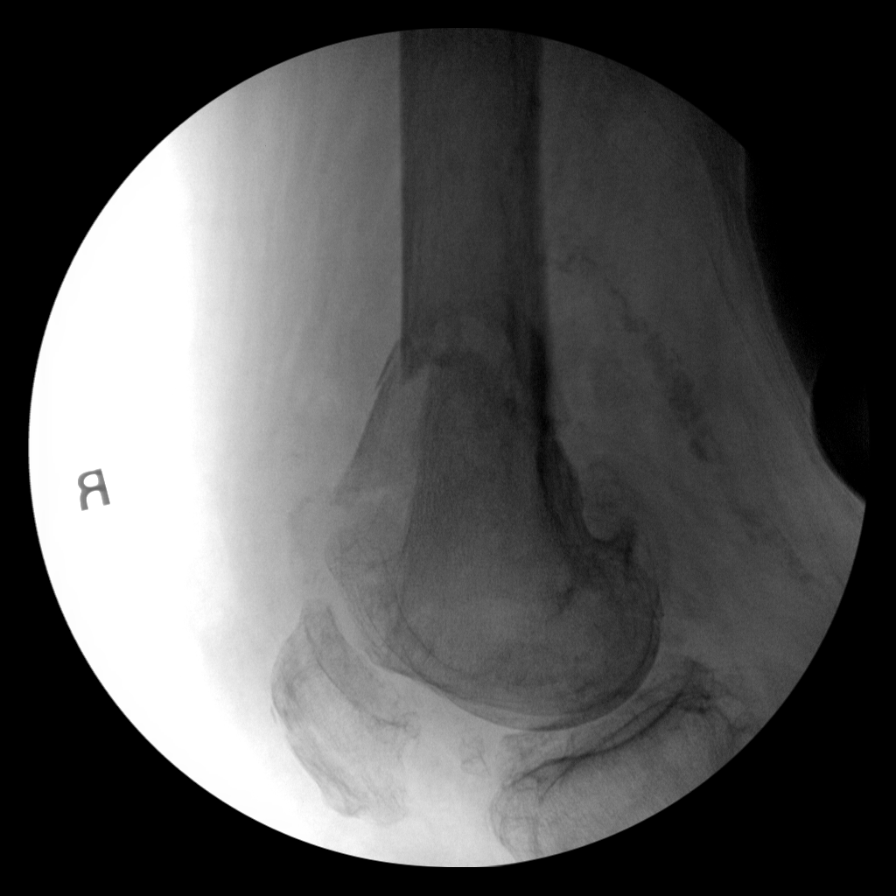
[im 4/7]
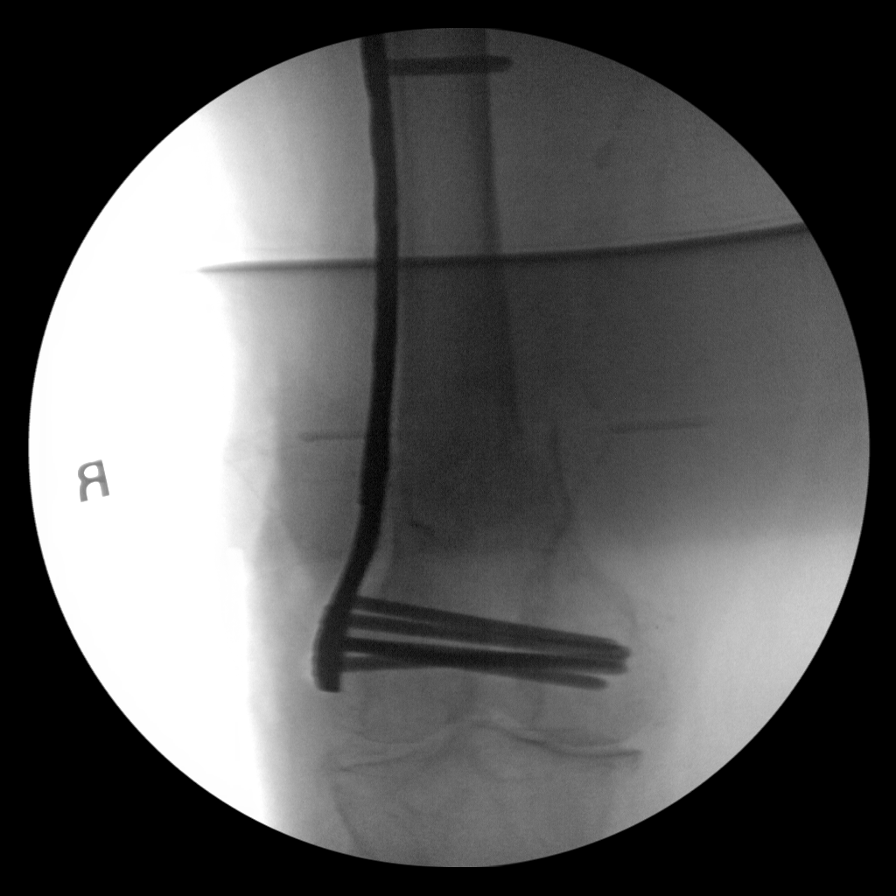
[im 5/7]
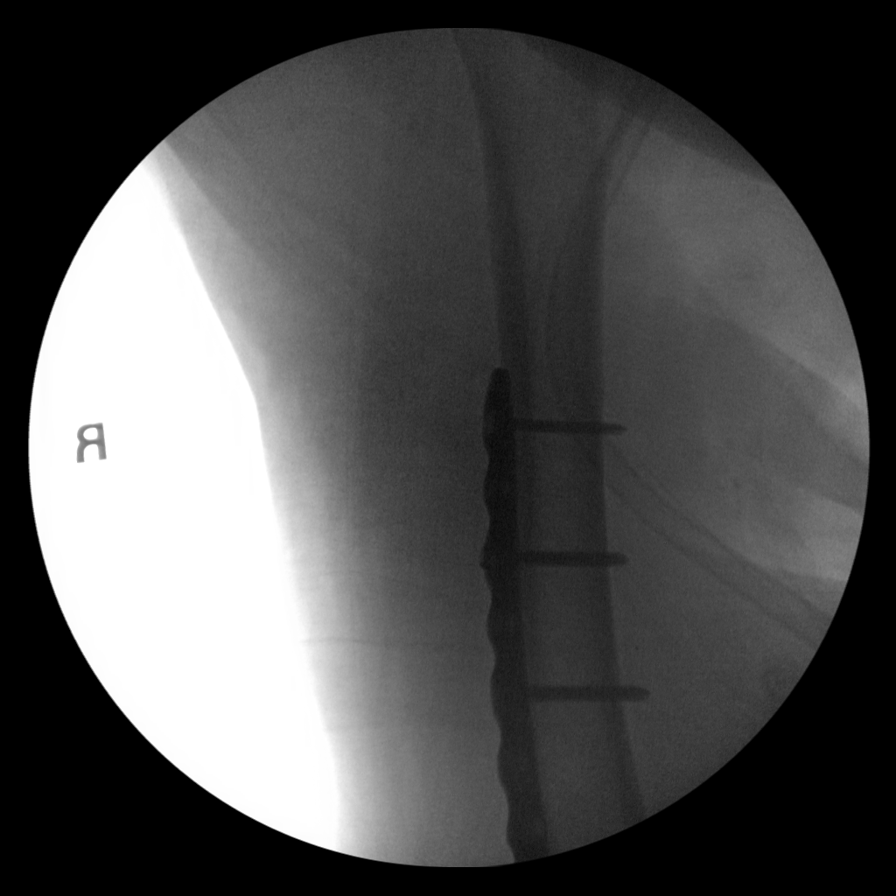
[im 6/7]
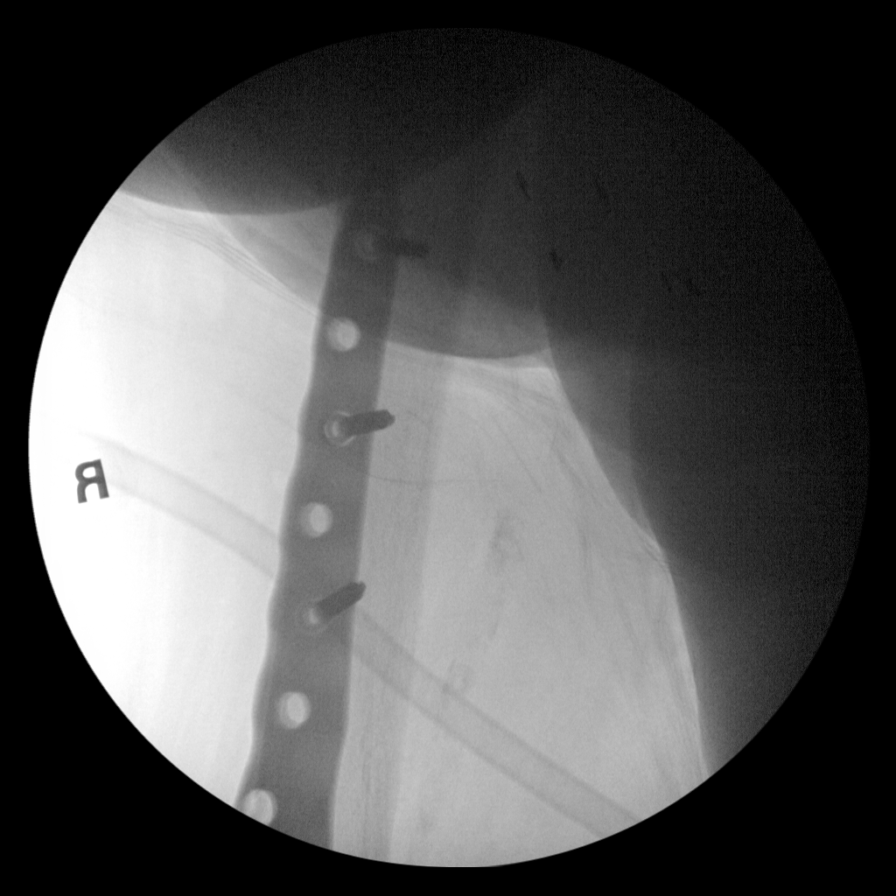
[im 7/7]
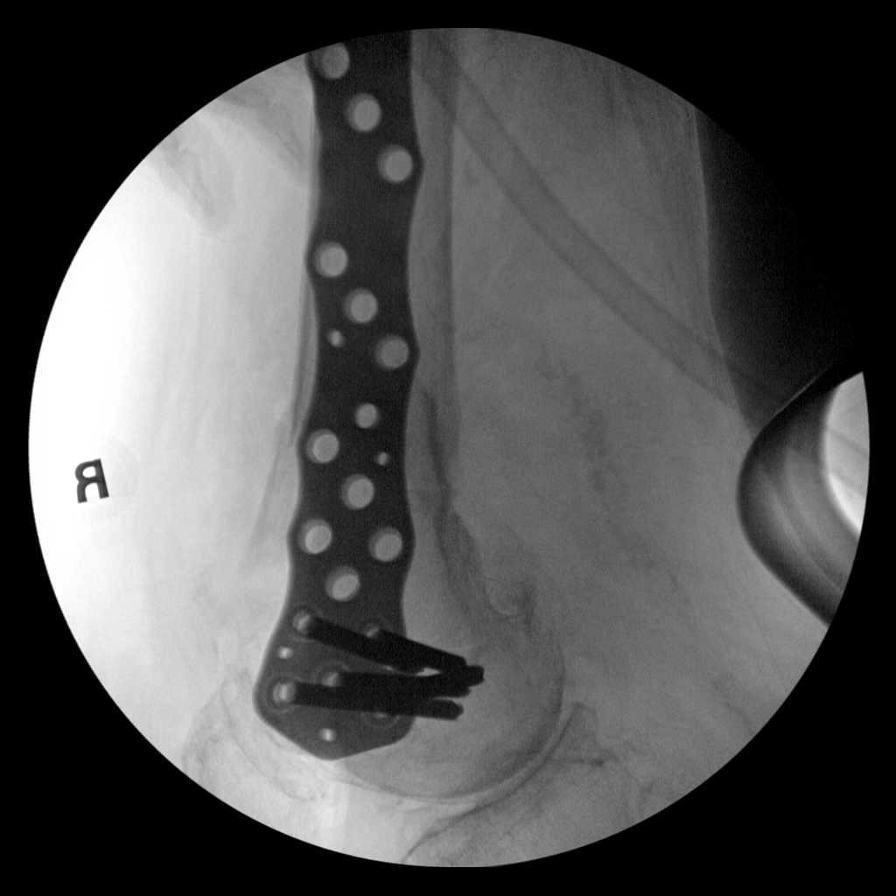

[7 of 7 positions shown; findings below may reference images not displayed]

FINDINGS: Seven intraoperative fluoroscopic spot images provided. The total
fluoroscopic time is 54 seconds with a total air kerma of 9.47 mGy.
There has been interval placement of fixation sideplate screws along
the distal femoral fracture.
IMPRESSION: Fluoroscopic spot images of ORIF of the right femur.

## 2021-02-04 IMAGING — RF DG C-ARM 1-60 MIN
1 series · 7 of 7 positions shown · non-contrast
Comparison: Right femur radiograph dated [DATE].

CLINICAL DATA: 75-year-old male with ORIF of the right femur.

EXAM:
RIGHT FEMUR 2 VIEWS; DG C-ARM 1-60 MIN

[Series 1: run · 7 of 7 slices shown]
[im 1/7]
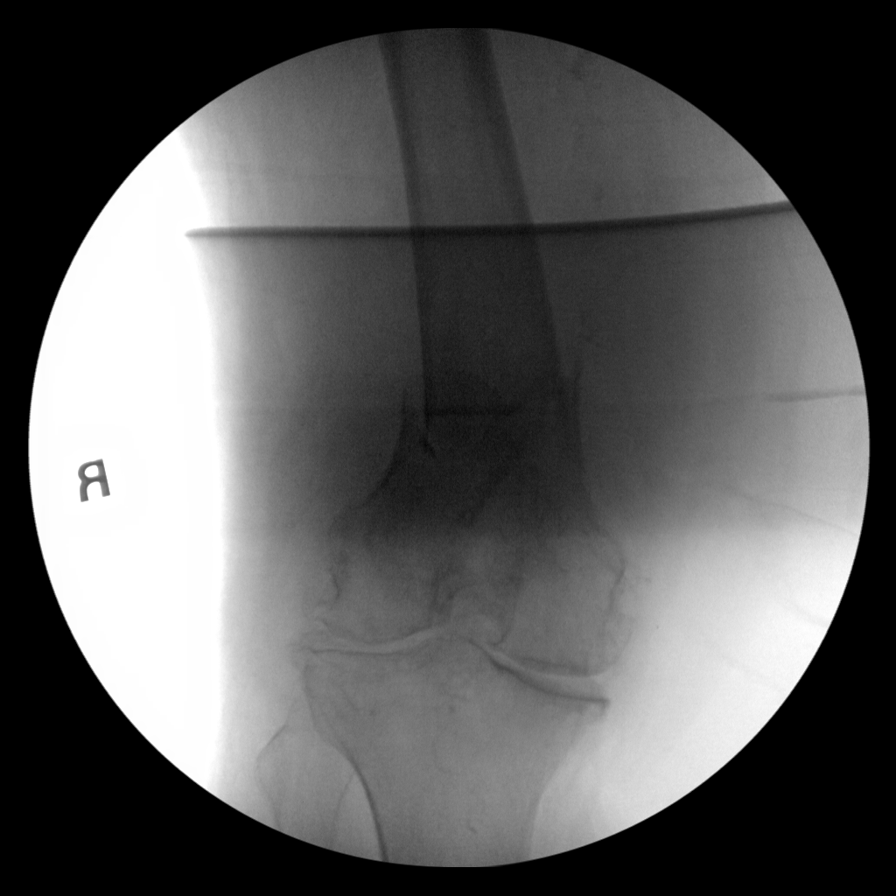
[im 2/7]
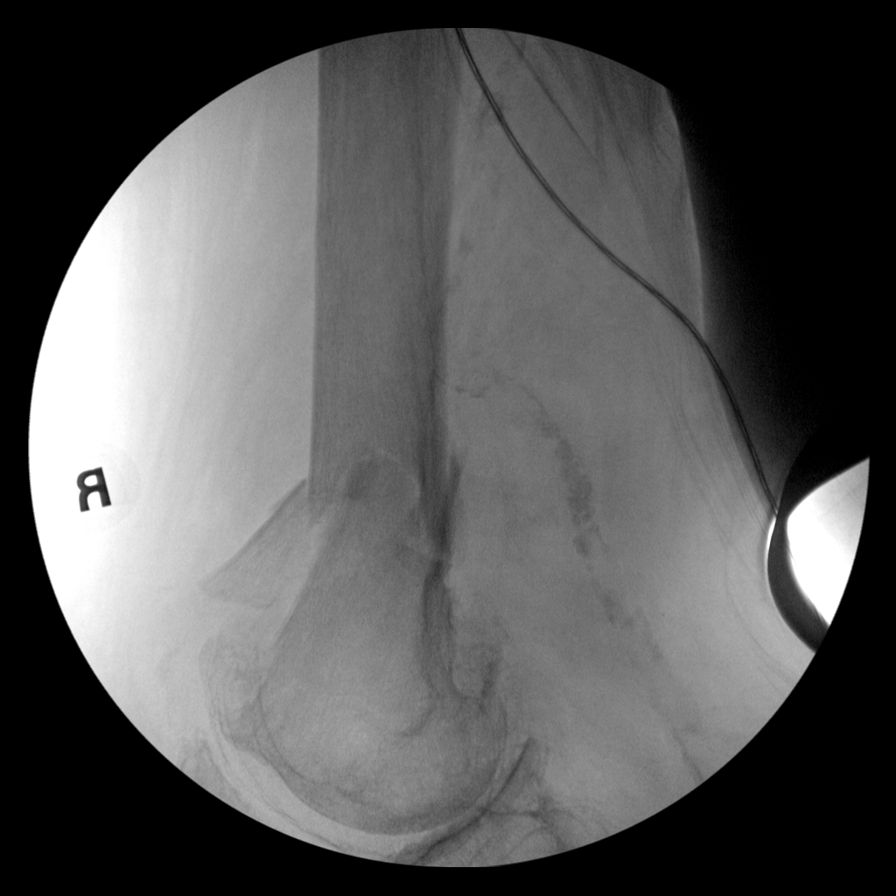
[im 3/7]
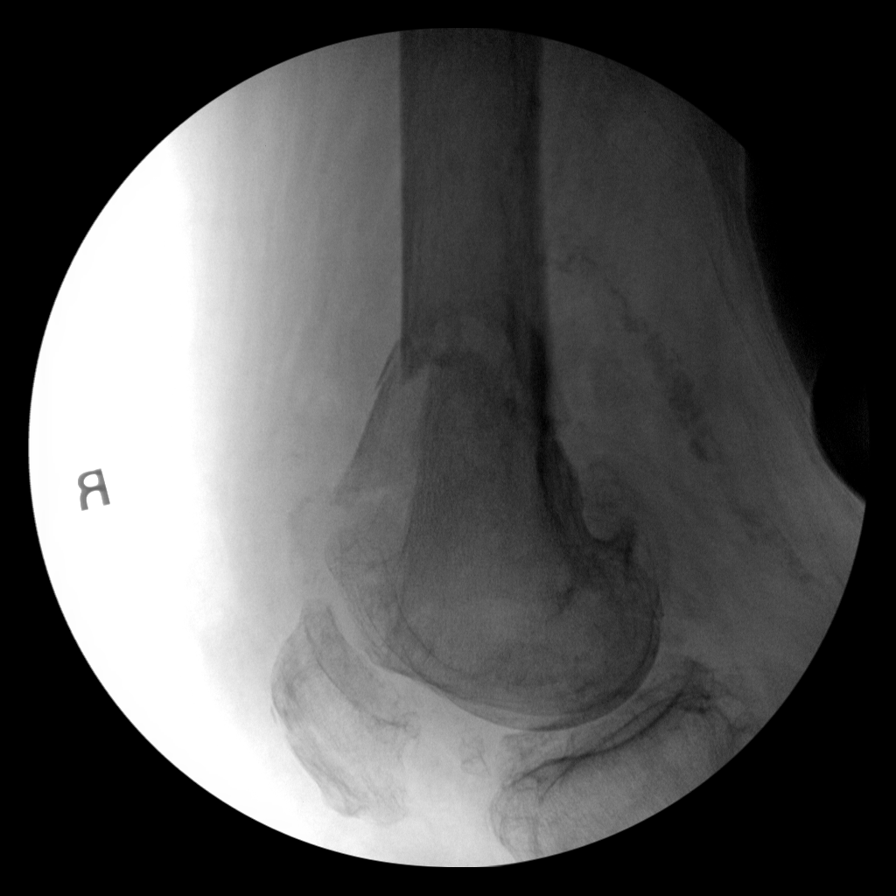
[im 4/7]
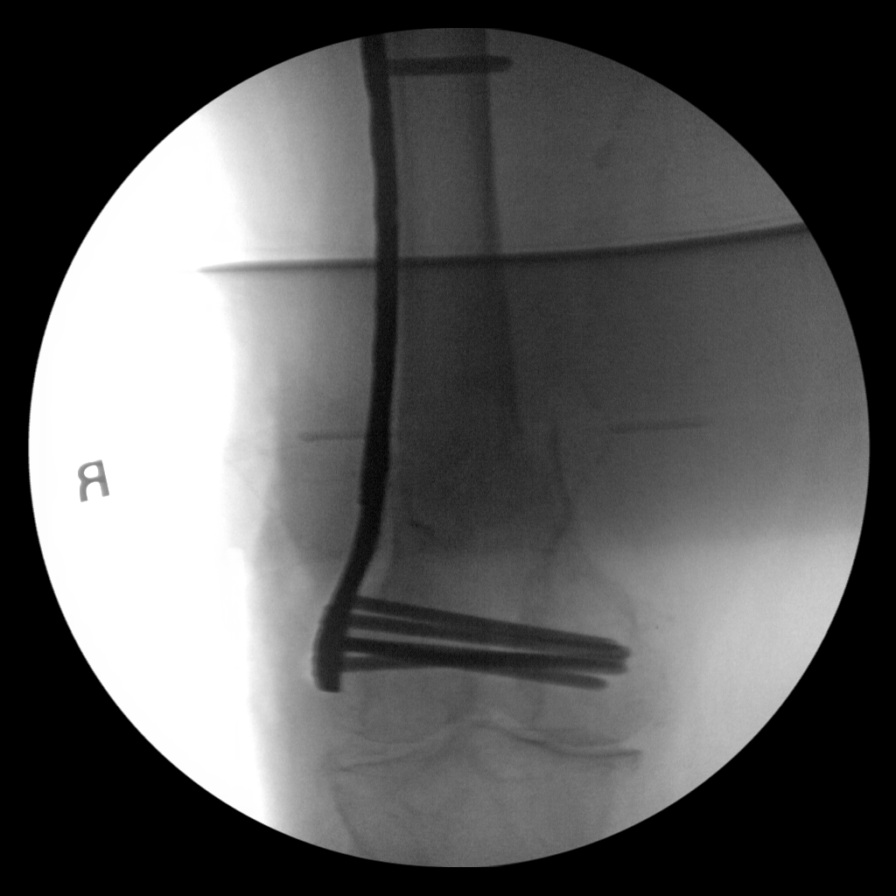
[im 5/7]
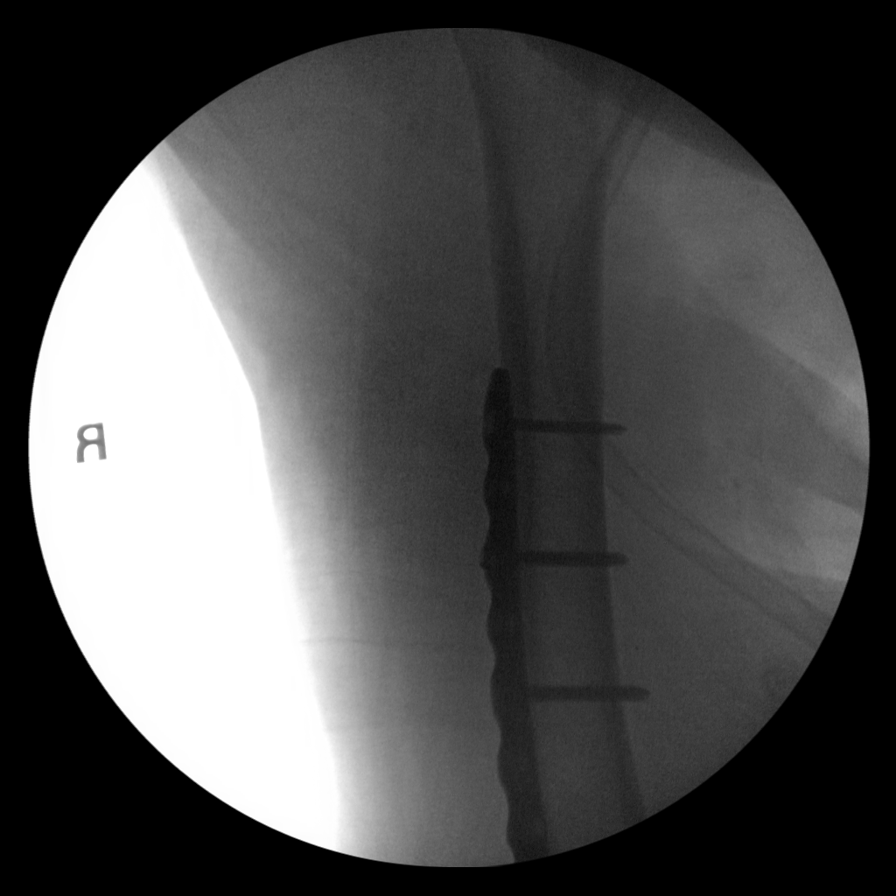
[im 6/7]
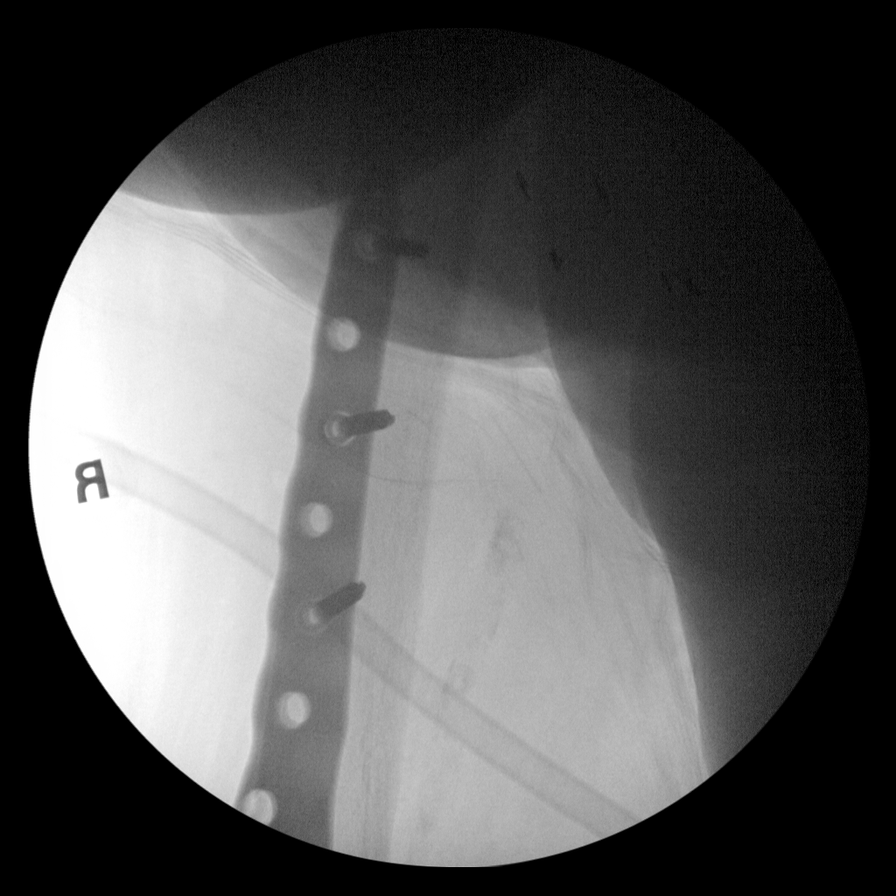
[im 7/7]
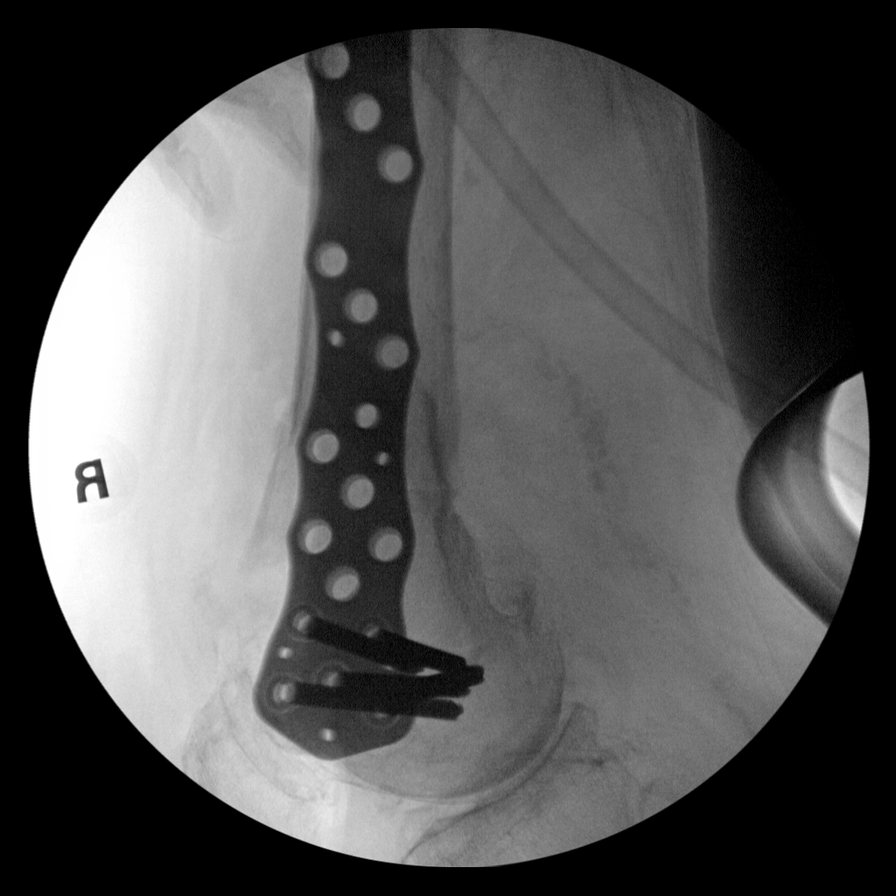

[7 of 7 positions shown; findings below may reference images not displayed]

FINDINGS: Seven intraoperative fluoroscopic spot images provided. The total
fluoroscopic time is 54 seconds with a total air kerma of 9.47 mGy.
There has been interval placement of fixation sideplate screws along
the distal femoral fracture.
IMPRESSION: Fluoroscopic spot images of ORIF of the right femur.

## 2021-02-04 SURGERY — OPEN REDUCTION INTERNAL FIXATION (ORIF) DISTAL FEMUR FRACTURE
Anesthesia: General | Site: Leg Upper | Laterality: Right

## 2021-02-04 MED ORDER — CEFAZOLIN IN SODIUM CHLORIDE 3-0.9 GM/100ML-% IV SOLN
INTRAVENOUS | Status: AC
  Filled 2021-02-04: qty 100

## 2021-02-04 MED ORDER — HYDROCODONE-ACETAMINOPHEN 5-325 MG PO TABS
1.0000 | ORAL_TABLET | ORAL | Status: DC | PRN
  Administered 2021-02-04 – 2021-02-11 (×23): 2 via ORAL
  Filled 2021-02-04 (×23): qty 2

## 2021-02-04 MED ORDER — ONDANSETRON HCL 4 MG/2ML IJ SOLN: 4.0000 mg | Freq: Once | INTRAMUSCULAR | Status: DC | PRN

## 2021-02-04 MED ORDER — VANCOMYCIN HCL 1000 MG IV SOLR
INTRAVENOUS | Status: DC | PRN
  Administered 2021-02-04: 1000 mg via TOPICAL

## 2021-02-04 MED ORDER — ROCURONIUM BROMIDE 10 MG/ML (PF) SYRINGE
PREFILLED_SYRINGE | INTRAVENOUS | Status: AC
  Filled 2021-02-04: qty 10

## 2021-02-04 MED ORDER — FUROSEMIDE 10 MG/ML IJ SOLN
40.0000 mg | Freq: Once | INTRAMUSCULAR | Status: AC
  Administered 2021-02-04: 40 mg via INTRAVENOUS
  Filled 2021-02-04: qty 4

## 2021-02-04 MED ORDER — METOCLOPRAMIDE HCL 5 MG PO TABS
5.0000 mg | ORAL_TABLET | Freq: Three times a day (TID) | ORAL | Status: DC | PRN
Start: 2021-02-04 — End: 2021-02-12

## 2021-02-04 MED ORDER — FENTANYL CITRATE (PF) 100 MCG/2ML IJ SOLN: 25.0000 ug | INTRAMUSCULAR | Status: DC | PRN

## 2021-02-04 MED ORDER — MIDAZOLAM HCL 5 MG/5ML IJ SOLN
INTRAMUSCULAR | Status: DC | PRN
  Administered 2021-02-04: 2 mg via INTRAVENOUS

## 2021-02-04 MED ORDER — PRAVASTATIN SODIUM 10 MG PO TABS
10.0000 mg | ORAL_TABLET | Freq: Every day | ORAL | Status: DC
  Administered 2021-02-04 – 2021-02-10 (×7): 10 mg via ORAL
  Filled 2021-02-04 (×8): qty 1

## 2021-02-04 MED ORDER — LIDOCAINE HCL (CARDIAC) PF 100 MG/5ML IV SOSY
PREFILLED_SYRINGE | INTRAVENOUS | Status: DC | PRN
  Administered 2021-02-04: 100 mg via INTRAVENOUS

## 2021-02-04 MED ORDER — MIDAZOLAM HCL 2 MG/2ML IJ SOLN
INTRAMUSCULAR | Status: AC
  Filled 2021-02-04: qty 2

## 2021-02-04 MED ORDER — ENOXAPARIN SODIUM 80 MG/0.8ML IJ SOSY
70.0000 mg | PREFILLED_SYRINGE | INTRAMUSCULAR | Status: DC
  Administered 2021-02-05 – 2021-02-11 (×7): 70 mg via SUBCUTANEOUS
  Filled 2021-02-04 (×7): qty 0.7

## 2021-02-04 MED ORDER — PHENYLEPHRINE 40 MCG/ML (10ML) SYRINGE FOR IV PUSH (FOR BLOOD PRESSURE SUPPORT)
PREFILLED_SYRINGE | INTRAVENOUS | Status: AC
  Filled 2021-02-04: qty 30

## 2021-02-04 MED ORDER — INSULIN ASPART 100 UNIT/ML IJ SOLN
0.0000 [IU] | Freq: Four times a day (QID) | INTRAMUSCULAR | Status: DC
  Administered 2021-02-05 – 2021-02-07 (×3): 1 [IU] via SUBCUTANEOUS
  Administered 2021-02-10: 2 [IU] via SUBCUTANEOUS
  Administered 2021-02-10 – 2021-02-11 (×3): 1 [IU] via SUBCUTANEOUS

## 2021-02-04 MED ORDER — ONDANSETRON HCL 4 MG/2ML IJ SOLN
INTRAMUSCULAR | Status: AC
  Filled 2021-02-04: qty 2

## 2021-02-04 MED ORDER — 0.9 % SODIUM CHLORIDE (POUR BTL) OPTIME
TOPICAL | Status: DC | PRN
  Administered 2021-02-04: 1000 mL

## 2021-02-04 MED ORDER — POLYETHYLENE GLYCOL 3350 17 G PO PACK: 17.0000 g | PACK | Freq: Every day | ORAL | Status: DC | PRN

## 2021-02-04 MED ORDER — ALBUMIN HUMAN 5 % IV SOLN: INTRAVENOUS | Status: DC | PRN

## 2021-02-04 MED ORDER — PROPOFOL 10 MG/ML IV BOLUS
INTRAVENOUS | Status: AC
  Filled 2021-02-04: qty 20

## 2021-02-04 MED ORDER — PHENYLEPHRINE HCL-NACL 10-0.9 MG/250ML-% IV SOLN
INTRAVENOUS | Status: DC | PRN
  Administered 2021-02-04: 50 ug/min via INTRAVENOUS

## 2021-02-04 MED ORDER — LACTATED RINGERS IV SOLN: INTRAVENOUS | Status: DC

## 2021-02-04 MED ORDER — ROCURONIUM BROMIDE 100 MG/10ML IV SOLN
INTRAVENOUS | Status: DC | PRN
  Administered 2021-02-04: 70 mg via INTRAVENOUS

## 2021-02-04 MED ORDER — CARVEDILOL 25 MG PO TABS
25.0000 mg | ORAL_TABLET | Freq: Two times a day (BID) | ORAL | Status: DC
  Administered 2021-02-04 – 2021-02-11 (×10): 25 mg via ORAL
  Filled 2021-02-04 (×13): qty 1

## 2021-02-04 MED ORDER — ENOXAPARIN SODIUM 40 MG/0.4ML IJ SOSY: 40.0000 mg | PREFILLED_SYRINGE | INTRAMUSCULAR | Status: DC

## 2021-02-04 MED ORDER — ACETAMINOPHEN 325 MG PO TABS: 325.0000 mg | ORAL_TABLET | Freq: Four times a day (QID) | ORAL | Status: DC | PRN

## 2021-02-04 MED ORDER — FENTANYL CITRATE (PF) 100 MCG/2ML IJ SOLN
INTRAMUSCULAR | Status: DC | PRN
  Administered 2021-02-04: 50 ug via INTRAVENOUS
  Administered 2021-02-04: 100 ug via INTRAVENOUS

## 2021-02-04 MED ORDER — ORAL CARE MOUTH RINSE: 15.0000 mL | Freq: Once | OROMUCOSAL | Status: AC

## 2021-02-04 MED ORDER — DOCUSATE SODIUM 100 MG PO CAPS
100.0000 mg | ORAL_CAPSULE | Freq: Two times a day (BID) | ORAL | Status: DC
  Administered 2021-02-04 – 2021-02-11 (×13): 100 mg via ORAL
  Filled 2021-02-04 (×13): qty 1

## 2021-02-04 MED ORDER — PROPOFOL 10 MG/ML IV BOLUS
INTRAVENOUS | Status: DC | PRN
  Administered 2021-02-04: 150 mg via INTRAVENOUS

## 2021-02-04 MED ORDER — SUGAMMADEX SODIUM 500 MG/5ML IV SOLN
INTRAVENOUS | Status: DC | PRN
  Administered 2021-02-04: 300 mg via INTRAVENOUS

## 2021-02-04 MED ORDER — PHENYLEPHRINE HCL (PRESSORS) 10 MG/ML IV SOLN
INTRAVENOUS | Status: DC | PRN
  Administered 2021-02-04 (×6): 200 ug via INTRAVENOUS

## 2021-02-04 MED ORDER — CEFAZOLIN SODIUM-DEXTROSE 2-4 GM/100ML-% IV SOLN
2.0000 g | Freq: Three times a day (TID) | INTRAVENOUS | Status: AC
  Administered 2021-02-04 – 2021-02-05 (×3): 2 g via INTRAVENOUS
  Filled 2021-02-04 (×3): qty 100

## 2021-02-04 MED ORDER — MAGNESIUM SULFATE 2 GM/50ML IV SOLN
2.0000 g | Freq: Once | INTRAVENOUS | Status: AC
  Administered 2021-02-04: 2 g via INTRAVENOUS
  Filled 2021-02-04: qty 50

## 2021-02-04 MED ORDER — ONDANSETRON HCL 4 MG PO TABS: 4.0000 mg | ORAL_TABLET | Freq: Four times a day (QID) | ORAL | Status: DC | PRN

## 2021-02-04 MED ORDER — VANCOMYCIN HCL 1000 MG IV SOLR
INTRAVENOUS | Status: AC
  Filled 2021-02-04: qty 1000

## 2021-02-04 MED ORDER — ONDANSETRON HCL 4 MG/2ML IJ SOLN
INTRAMUSCULAR | Status: DC | PRN
  Administered 2021-02-04: 4 mg via INTRAVENOUS

## 2021-02-04 MED ORDER — PHENYLEPHRINE HCL-NACL 10-0.9 MG/250ML-% IV SOLN
INTRAVENOUS | Status: AC
  Filled 2021-02-04: qty 250

## 2021-02-04 MED ORDER — CHLORHEXIDINE GLUCONATE 0.12 % MT SOLN
OROMUCOSAL | Status: AC
  Administered 2021-02-04: 15 mL via OROMUCOSAL
  Filled 2021-02-04: qty 15

## 2021-02-04 MED ORDER — CHLORHEXIDINE GLUCONATE 0.12 % MT SOLN: 15.0000 mL | Freq: Once | OROMUCOSAL | Status: AC

## 2021-02-04 MED ORDER — TAMSULOSIN HCL 0.4 MG PO CAPS
0.4000 mg | ORAL_CAPSULE | Freq: Every day | ORAL | Status: DC
  Administered 2021-02-04 – 2021-02-10 (×7): 0.4 mg via ORAL
  Filled 2021-02-04 (×7): qty 1

## 2021-02-04 MED ORDER — FINASTERIDE 5 MG PO TABS
5.0000 mg | ORAL_TABLET | Freq: Every day | ORAL | Status: DC
Start: 2021-02-04 — End: 2021-02-12
  Administered 2021-02-05 – 2021-02-11 (×7): 5 mg via ORAL
  Filled 2021-02-04 (×7): qty 1

## 2021-02-04 MED ORDER — MORPHINE SULFATE (PF) 2 MG/ML IV SOLN
0.5000 mg | INTRAVENOUS | Status: DC | PRN
  Filled 2021-02-04: qty 1

## 2021-02-04 MED ORDER — HYDROCODONE-ACETAMINOPHEN 7.5-325 MG PO TABS
1.0000 | ORAL_TABLET | ORAL | Status: DC | PRN
  Administered 2021-02-09 – 2021-02-11 (×2): 2 via ORAL
  Filled 2021-02-04 (×2): qty 2

## 2021-02-04 MED ORDER — SODIUM CHLORIDE 0.9 % IV SOLN: INTRAVENOUS | Status: DC

## 2021-02-04 MED ORDER — FENTANYL CITRATE (PF) 250 MCG/5ML IJ SOLN
INTRAMUSCULAR | Status: AC
  Filled 2021-02-04: qty 5

## 2021-02-04 MED ORDER — METOCLOPRAMIDE HCL 5 MG/ML IJ SOLN
5.0000 mg | Freq: Three times a day (TID) | INTRAMUSCULAR | Status: DC | PRN
Start: 2021-02-04 — End: 2021-02-12

## 2021-02-04 MED ORDER — ONDANSETRON HCL 4 MG/2ML IJ SOLN: 4.0000 mg | Freq: Four times a day (QID) | INTRAMUSCULAR | Status: DC | PRN

## 2021-02-04 MED ORDER — LIDOCAINE 2% (20 MG/ML) 5 ML SYRINGE
INTRAMUSCULAR | Status: AC
  Filled 2021-02-04: qty 5

## 2021-02-04 SURGICAL SUPPLY — 101 items
BAG COUNTER SPONGE SURGICOUNT (BAG) ×3 IMPLANT
BAG SURGICOUNT SPONGE COUNTING (BAG) ×1
BIT DRILL 3.3 LONG (BIT) ×4 IMPLANT
BIT DRILL QC 3.3X195 (BIT) ×4 IMPLANT
BIT DRILL QC NCB-PT 4.3X230 (BIT) ×4 IMPLANT
BLADE CLIPPER SURG (BLADE) ×4 IMPLANT
BLADE SURG 10 STRL SS (BLADE) IMPLANT
BNDG ELASTIC 4X5.8 VLCR STR LF (GAUZE/BANDAGES/DRESSINGS) ×4 IMPLANT
BNDG ELASTIC 6X15 VLCR STRL LF (GAUZE/BANDAGES/DRESSINGS) ×4 IMPLANT
BNDG ELASTIC 6X5.8 VLCR STR LF (GAUZE/BANDAGES/DRESSINGS) ×4 IMPLANT
BNDG GAUZE ELAST 4 BULKY (GAUZE/BANDAGES/DRESSINGS) ×8 IMPLANT
BRUSH SCRUB EZ PLAIN DRY (MISCELLANEOUS) ×8 IMPLANT
CANISTER SUCT 3000ML PPV (MISCELLANEOUS) ×4 IMPLANT
CAP LOCK NCB (Cap) ×28 IMPLANT
COVER SURGICAL LIGHT HANDLE (MISCELLANEOUS) ×8 IMPLANT
CUFF TOURN SGL QUICK 34 (TOURNIQUET CUFF)
CUFF TOURN SGL QUICK 42 (TOURNIQUET CUFF) IMPLANT
CUFF TRNQT CYL 34X4.125X (TOURNIQUET CUFF) IMPLANT
DECANTER SPIKE VIAL GLASS SM (MISCELLANEOUS) IMPLANT
DRAPE C-ARM 42X72 X-RAY (DRAPES) ×4 IMPLANT
DRAPE C-ARMOR (DRAPES) ×4 IMPLANT
DRAPE IMP U-DRAPE 54X76 (DRAPES) ×4 IMPLANT
DRAPE ORTHO SPLIT 77X108 STRL (DRAPES) ×6
DRAPE SURG ORHT 6 SPLT 77X108 (DRAPES) ×6 IMPLANT
DRAPE U-SHAPE 47X51 STRL (DRAPES) ×4 IMPLANT
DRSG ADAPTIC 3X8 NADH LF (GAUZE/BANDAGES/DRESSINGS) ×4 IMPLANT
DRSG EMULSION OIL 3X3 NADH (GAUZE/BANDAGES/DRESSINGS) ×4 IMPLANT
DRSG MEPILEX BORDER 4X8 (GAUZE/BANDAGES/DRESSINGS) ×4 IMPLANT
DRSG PAD ABDOMINAL 8X10 ST (GAUZE/BANDAGES/DRESSINGS) ×16 IMPLANT
ELECT REM PT RETURN 9FT ADLT (ELECTROSURGICAL) ×4
ELECTRODE REM PT RTRN 9FT ADLT (ELECTROSURGICAL) ×2 IMPLANT
EVACUATOR 1/8 PVC DRAIN (DRAIN) IMPLANT
EVACUATOR 3/16  PVC DRAIN (DRAIN)
EVACUATOR 3/16 PVC DRAIN (DRAIN) IMPLANT
GAUZE SPONGE 4X4 12PLY STRL (GAUZE/BANDAGES/DRESSINGS) ×4 IMPLANT
GLOVE SRG 8 PF TXTR STRL LF DI (GLOVE) ×2 IMPLANT
GLOVE SURG ENC MOIS LTX SZ7.5 (GLOVE) ×4 IMPLANT
GLOVE SURG ENC MOIS LTX SZ8 (GLOVE) ×4 IMPLANT
GLOVE SURG UNDER POLY LF SZ7.5 (GLOVE) ×4 IMPLANT
GLOVE SURG UNDER POLY LF SZ8 (GLOVE) ×2
GOWN STRL REUS W/ TWL LRG LVL3 (GOWN DISPOSABLE) ×4 IMPLANT
GOWN STRL REUS W/ TWL XL LVL3 (GOWN DISPOSABLE) ×2 IMPLANT
GOWN STRL REUS W/TWL 2XL LVL3 (GOWN DISPOSABLE) ×8 IMPLANT
GOWN STRL REUS W/TWL LRG LVL3 (GOWN DISPOSABLE) ×4
GOWN STRL REUS W/TWL XL LVL3 (GOWN DISPOSABLE) ×2
K-WIRE 2.0 (WIRE) ×2
K-WIRE FXSTD 280X2XNS SS (WIRE) ×2
KIT BASIN OR (CUSTOM PROCEDURE TRAY) ×4 IMPLANT
KIT TURNOVER KIT B (KITS) ×4 IMPLANT
KWIRE FXSTD 280X2XNS SS (WIRE) ×2 IMPLANT
MANIFOLD NEPTUNE II (INSTRUMENTS) ×4 IMPLANT
NEEDLE 22X1 1/2 (OR ONLY) (NEEDLE) IMPLANT
NS IRRIG 1000ML POUR BTL (IV SOLUTION) ×4 IMPLANT
PACK ORTHO EXTREMITY (CUSTOM PROCEDURE TRAY) ×4 IMPLANT
PACK TOTAL JOINT (CUSTOM PROCEDURE TRAY) ×4 IMPLANT
PACK UNIVERSAL I (CUSTOM PROCEDURE TRAY) ×4 IMPLANT
PAD ARMBOARD 7.5X6 YLW CONV (MISCELLANEOUS) ×8 IMPLANT
PAD CAST 4YDX4 CTTN HI CHSV (CAST SUPPLIES) ×4 IMPLANT
PADDING CAST COTTON 4X4 STRL (CAST SUPPLIES) ×4
PADDING CAST COTTON 6X4 STRL (CAST SUPPLIES) ×4 IMPLANT
PADDING CAST SYN 6 (CAST SUPPLIES) ×2
PADDING CAST SYNTHETIC 4 (CAST SUPPLIES) ×2
PADDING CAST SYNTHETIC 4X4 STR (CAST SUPPLIES) ×2 IMPLANT
PADDING CAST SYNTHETIC 6X4 NS (CAST SUPPLIES) ×2 IMPLANT
PIN GUIDE FEMORAL STERILE (PIN) ×4 IMPLANT
PLATE FEM DIST NCB PP 278MM (Plate) ×4 IMPLANT
RETRIEVER SUT HEWSON (MISCELLANEOUS) IMPLANT
SCREW CORTICAL NCB 5.0X40 (Screw) ×4 IMPLANT
SCREW CORTICAL NCB 5.0X90MM (Screw) ×12 IMPLANT
SCREW NCB 4.0MX38M (Screw) ×4 IMPLANT
SCREW NCB 5.0X38 (Screw) ×4 IMPLANT
SCREW NCB 5.0X85MM (Screw) ×8 IMPLANT
SPONGE T-LAP 18X18 ~~LOC~~+RFID (SPONGE) ×4 IMPLANT
STAPLER VISISTAT 35W (STAPLE) ×4 IMPLANT
SUCTION FRAZIER HANDLE 10FR (MISCELLANEOUS) ×2
SUCTION TUBE FRAZIER 10FR DISP (MISCELLANEOUS) ×2 IMPLANT
SUT FIBERWIRE #2 38 T-5 BLUE (SUTURE)
SUT FIBERWIRE #5 38 CONV NDL (SUTURE)
SUT MNCRL AB 3-0 PS2 27 (SUTURE) ×8 IMPLANT
SUT PROLENE 0 CT 2 (SUTURE) IMPLANT
SUT STEEL 5 V 56 M (SUTURE) ×4 IMPLANT
SUT VIC AB 0 CT1 18XCR BRD 8 (SUTURE) ×2 IMPLANT
SUT VIC AB 0 CT1 27 (SUTURE) ×4
SUT VIC AB 0 CT1 27XBRD ANBCTR (SUTURE) ×4 IMPLANT
SUT VIC AB 0 CT1 8-18 (SUTURE) ×2
SUT VIC AB 1 CT1 27 (SUTURE) ×4
SUT VIC AB 1 CT1 27XBRD ANBCTR (SUTURE) ×4 IMPLANT
SUT VIC AB 2-0 CT1 27 (SUTURE) ×4
SUT VIC AB 2-0 CT1 TAPERPNT 27 (SUTURE) ×4 IMPLANT
SUTURE FIBERWR #2 38 T-5 BLUE (SUTURE) IMPLANT
SUTURE FIBERWR #5 38 CONV NDL (SUTURE) IMPLANT
SYR 20ML ECCENTRIC (SYRINGE) IMPLANT
SYR CONTROL 10ML LL (SYRINGE) IMPLANT
TOWEL GREEN STERILE (TOWEL DISPOSABLE) ×8 IMPLANT
TOWEL GREEN STERILE FF (TOWEL DISPOSABLE) ×4 IMPLANT
TRAY FOLEY MTR SLVR 16FR STAT (SET/KITS/TRAYS/PACK) IMPLANT
TUBE CONNECTING 12'X1/4 (SUCTIONS) ×1
TUBE CONNECTING 12X1/4 (SUCTIONS) ×3 IMPLANT
UNDERPAD 30X36 HEAVY ABSORB (UNDERPADS AND DIAPERS) ×4 IMPLANT
WATER STERILE IRR 1000ML POUR (IV SOLUTION) ×8 IMPLANT
YANKAUER SUCT BULB TIP NO VENT (SUCTIONS) IMPLANT

## 2021-02-04 NOTE — Transfer of Care (Signed)
Immediate Anesthesia Transfer of Care Note  Patient: Brandon [name]  Procedure(s) Performed: OPEN REDUCTION INTERNAL FIXATION (ORIF) DISTAL FEMUR FRACTURE (Right: Leg Upper)  Patient Location: PACU  Anesthesia Type:General  Level of Consciousness: awake, drowsy and patient cooperative  Airway & Oxygen Therapy: Patient Spontanous Breathing and Patient connected to face mask oxygen  [name]-op Assessment: Report given to RN and [name] -op Vital signs reviewed and stable  [name] vital signs: Reviewed and stable  Last Vitals:  Vitals Value Taken Time  BP 122/72 02/04/21 1721  Temp    Pulse 113 02/04/21 1722  Resp 24 02/04/21 1722  SpO2 100 % 02/04/21 1722  Vitals shown include unvalidated device data.  Last Pain:  Vitals:   02/04/21 1339  TempSrc: Oral  PainSc: 9          Complications: No notable events documented.

## 2021-02-04 NOTE — Op Note (Signed)
Orthopaedic Surgery Operative Note (CSN: [id number] ) Date of Surgery: 02/04/2021  Admit Date: 02/03/2021   Diagnoses: Pre-Op Diagnoses: Right distal femur fracture Right patella fracture  Post-Op Diagnosis: Same  Procedures: CPT 27511-Open reduction internal fixation of right distal femur fracture CPT 27520-Closed treatment of patella fracture   Surgeons : Primary: Shona Needles, MD  Assistant: Patrecia Pace, PA-C  Location: OR 8   Anesthesia:General   Antibiotics: Ancef 3g preop with 1 gm vancomycin powder placed topically   Tourniquet time:None    Estimated Blood IEPP:295 mL  Complications:None  Specimens:None   Implants: Implant Name Type Inv. Item Serial No. Manufacturer Lot No. LRB No. Used Action  SCREW CORTICAL NCB 5.0X90MM - [id number] Screw SCREW CORTICAL NCB 5.0X90MM  [employer](ORTH,TRAU,BIO,SG) [id number] Right 3 Implanted  PLATE FEM DIST NCB PP 278MM - [id number] Plate PLATE FEM DIST NCB PP 278MM  [employer](ORTH,TRAU,BIO,SG)  Right 1 Implanted  SCREW NCB 5.0X85MM - [id number] Screw SCREW NCB 5.0X85MM  [employer](ORTH,TRAU,BIO,SG)  Right 2 Implanted  SCREW CORTICAL NCB 5.0X40 - [id number] Screw SCREW CORTICAL NCB 5.0X40  [employer](ORTH,TRAU,BIO,SG)  Right 1 Implanted  SCREW NCB 5.0X38 - [id number] Screw SCREW NCB 5.0X38  [employer](ORTH,TRAU,BIO,SG)  Right 1 Implanted  SCREW NCB 4.0MX38M - [id number] Screw SCREW NCB 4.0MX38M  [employer](ORTH,TRAU,BIO,SG)  Right 1 Implanted  CAP LOCK NCB - [id number] Cap CAP LOCK NCB  [employer](ORTH,TRAU,BIO,SG)  Right 7 Implanted     Indications for Surgery: 75 year old male who fell and sustained a right distal femur fracture.  He also had a nondisplaced patella injury.  Due to the unstable nature of his right femur I recommend proceeding with open reduction internal fixation.  Risks and benefits were discussed with the patient.  Risks include but not limited to bleeding, infection, malunion, nonunion, hardware  failure, hardware rotation, nerve or blood vessel injury, knee stiffness, knee arthritis, DVT, even the possibility of anesthetic complications.  Patient agreed to proceed with surgery and consent was obtained.  Operative Findings: 1.  Open reduction internal fixation of right supracondylar distal femur fracture using [employer] NCB 12 hole distal femoral locking plate 2.  No distraction or displacement of patella on radiographs with nonoperative treatment.  Procedure: The patient was identified in the preoperative holding area. Consent was confirmed with the patient and their family and all questions were answered. The operative extremity was marked after confirmation with the patient. he was then brought back to the operating room by our anesthesia colleagues.  He was placed under general anesthetic and carefully transferred over to a radiolucent flat top table.  A bump was placed under his operative hip.  The right lower extremity was prepped and draped in usual sterile fashion.  A timeout was performed to verify the patient, the procedure, and the extremity.  Fluoroscopic imaging splint obtained to show the unstable nature of his injury.  Knee were flexed over a triangle and appropriate alignment was obtained by traction to the lower extremity.  A lateral approach to the distal femur was carried down through skin and subcutaneous tissue.  I incised the IT band and expose the lateral cortex of the knee femoral condyle.  I then developed the path along the lateral cortex of the femur underneath the vastus lateralis to place the plate.  A 12 hole [employer] NCB plate was then slid submuscularly along the lateral cortex of the femur.  A 2.0 mm K wire was used distally though aligned appropriately.  Percutaneous incision and a threaded  guidewire was used to align the proximal portion of the plate.  I then placed a 3.3 mm drill bit to bicortically drilled the proximal plate.  I then placed a 5.0  millimeter screws distally to bring the plate flush to bone.  I then percutaneously placed a 5.0 millimeter screws into the femoral shaft to correct the coronal alignment.  The most proximal 3.3 mm drill bit was removed and a 4.0 millimeter screw was placed.  Locking caps were placed on the 5.0 millimeter screws in the femoral shaft.  I returned to the distal segment and placed a total of five 5.0 millimeter screws.  Locking caps were placed in all of the distal screws.  Final fluoroscopic imaging was obtained.  There is no distraction or displacement of the patella.  The incision was copiously irrigated.  A gram of vancomycin powder was placed to the incision.  A layered closure of 0 Vicryl, 2-0 Vicryl and 3-0 Monocryl with Dermabond was used to close the skin.  Sterile dressings were placed.  The patient was awoken from anesthesia and taken to the PACU in stable condition.  Post Op Plan/Instructions: Patient will be touchdown weightbearing to the right lower extremity.  He will have unrestricted range of motion of the knee.  He will receive postoperative Ancef.  He will receive Lovenox for DVT prophylaxis.  I was present and performed the entire surgery.  Patrecia Pace, PA-C did assist me throughout the case. An assistant was necessary given the difficulty in approach, maintenance of reduction and ability to instrument the fracture.   Katha Hamming, MD [facility]

## 2021-02-04 NOTE — Consult Note (Signed)
[facility]edic [facility]uma [facility] (OTS) Consult   Patient ID: Brandon [name] MRN: [id number] DOB/AGE: 1946-05-13 75 y.o.  Reason for Consult:Right distal femur fracture Referring Physician: Dr. Marzetta Board, MD [facility]  HPI: Brandon [name] is an 75 y.o. male who is being seen in consultation at the request of Dr. Cruzita Lederer for evaluation of right distal femur fracture and patella fracture.  The patient has a history of atrial fibrillation not on anticoagulation due to previous GI bleeds.  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  He has chronic anemia and diabetes and obesity.  He was at home when he did on his right leg.  He had immediate pain and deformity.  Was brought to the emergency room where x-rays show a right distal femur fracture.  Patient has had previous surgeries with Dr. Marcelino Scot.  The patient lives at home with his mother who is 19.  She recently fractured her femur and is in rehab currently.  They have a handicap accessible apartment.  At baseline ambulates with the use of a walker.  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  Denies any pain other than his right lower extremity.  Denies any significant numbness or tingling.  Past Medical History:  Diagnosis Date   Anemia 07/2019   Hb 6.7 >> 3 PRBCs >> 8.8 in 07/2019.      Atrial fibrillation ([place])    Cholelithiasis    Cirrhosis of liver ([place])    Presumed due to EtOH as well as fatty liver from morbid obesity.  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  Not performed in [place] N/A 09/17/2019   Procedure: COLONOSCOPY WITH PROPOFOL;  Surgeon: [name], MD;  Location: [place];  Service: Endoscopy;  Laterality: N/A;   CORONARY ARTERY BYPASS GRAFT     in his 57s   ESOPHAGOGASTRODUODENOSCOPY (EGD) WITH PROPOFOL N/A 08/14/2019   Procedure: ESOPHAGOGASTRODUODENOSCOPY (EGD) WITH PROPOFOL;  Surgeon: [name], MD;  Location: [place];  Service: Gastroenterology;  Laterality: N/A;   ESOPHAGOGASTRODUODENOSCOPY (EGD) WITH PROPOFOL N/A 09/17/2019   Procedure: ESOPHAGOGASTRODUODENOSCOPY (EGD) WITH PROPOFOL;  Surgeon: [name], MD;  Location: [place];  Service: Endoscopy;  Laterality: N/A;   ESOPHAGOGASTRODUODENOSCOPY (EGD) WITH PROPOFOL N/A 10/04/2019   Procedure: ESOPHAGOGASTRODUODENOSCOPY (EGD) WITH PROPOFOL;  Surgeon: Mauri Pole, MD;  Location: [facility];  Service: Endoscopy;  Laterality: N/A;   GIVENS CAPSULE STUDY N/A 09/17/2019   Procedure: GIVENS CAPSULE STUDY;  Surgeon: [name], MD;  Location: [place];  Service: Endoscopy;  Laterality: N/A;   HOT HEMOSTASIS N/A 08/14/2019   Procedure: HOT HEMOSTASIS (ARGON PLASMA COAGULATION/BICAP);  Surgeon: Hilarie Fredrickson,  Lajuan Lines, MD;  Location: [place];  Service: Gastroenterology;  Laterality: N/A;   HOT HEMOSTASIS N/A 09/17/2019   Procedure: HOT HEMOSTASIS (ARGON PLASMA COAGULATION/BICAP);  Surgeon: [name], MD;  Location: [place];  Service: Endoscopy;  Laterality: N/A;   PARTIAL COLECTOMY  2003   To address colon cancer   PATELLAR TENDON REPAIR Right 09/30/2018   Procedure: PATELLA TENDON REPAIR;  Surgeon: Altamese Nashua, MD;  Location: [place];   Service: Orthopedics;  Laterality: Right;   POLYPECTOMY  09/17/2019   Procedure: POLYPECTOMY;  Surgeon: [name], MD;  Location: [facility] ENDOSCOPY;  Service: Endoscopy;;    Family History  Problem Relation Age of Onset   Colon cancer Father    CAD Other    Colon cancer Paternal Uncle    Colon cancer Paternal Uncle    Diabetes Neg Hx     Social History:  reports that he quit smoking about 32 years ago. His smoking use included cigarettes. He has never used smokeless tobacco. He reports previous alcohol use. He reports that he does not use drugs.  Allergies: No Known Allergies  Medications:  No current facility-administered medications on file prior to encounter.   Current Outpatient Medications on File Prior to Encounter  Medication Sig Dispense Refill   acetaminophen (TYLENOL) 500 MG tablet Take 1,000-1,500 mg by mouth every 6 (six) hours as needed for moderate pain.     carvedilol (COREG) 25 MG tablet Take 25 mg by mouth 2 (two) times daily with a meal.     finasteride (PROSCAR) 5 MG tablet Take 1 tablet (5 mg total) by mouth daily. 30 tablet 0   furosemide (LASIX) 20 MG tablet Take 20 mg by mouth 2 (two) times daily.     hydrOXYzine (ATARAX/VISTARIL) 25 MG tablet Take 1 tablet (25 mg total) by mouth daily. 30 tablet 0   metFORMIN (GLUCOPHAGE-XR) 500 MG 24 hr tablet Take 500 mg by mouth daily with breakfast.      nystatin (MYCOSTATIN/NYSTOP) powder APPLY TOPICALLY TWO TIMES DAILY FOR 7 DAYS. (Patient taking differently: Apply 1 application topically daily as needed (irritation).) 30 g 0   pantoprazole (PROTONIX) 40 MG tablet Take 1 tablet (40 mg total) by mouth daily. 30 tablet 11   polyethylene glycol (MIRALAX / GLYCOLAX) packet Take 17 g by mouth 2 (two) times daily. (Patient taking differently: Take 17 g by mouth daily as needed for mild constipation. Mix in coffee and drink) 14 each 0   pravastatin (PRAVACHOL) 10 MG tablet Take 10 mg by mouth at bedtime.     predniSONE  (DELTASONE) 5 MG tablet TAKE 2 TABLETS (10 MG TOTAL) BY MOUTH DAILY WITH BREAKFAST FOR 1 DAY. (Patient taking differently: Take 5 mg by mouth daily with breakfast.) 2 tablet 0   senna-docusate (SENOKOT-S) 8.6-50 MG tablet Take 1 tablet by mouth 2 (two) times daily. (Patient taking differently: Take 1 tablet by mouth daily as needed for mild constipation.) 30 tablet 0   spironolactone (ALDACTONE) 50 MG tablet Take 50 mg by mouth daily.     tamsulosin (FLOMAX) 0.4 MG CAPS capsule Take 0.4 mg by mouth at bedtime.     triamcinolone cream (KENALOG) 0.1 % Apply 1 application topically 2 (two) times daily. 30 g 0   triamcinolone ointment (KENALOG) 0.5 % APPLY 1 APPLICATION TOPICALLY TWO TIMES DAILY. (Patient not taking: No sig reported) 60 g 0     ROS: Constitutional: No fever or chills Vision: No changes in vision ENT: No difficulty swallowing CV:  No chest pain Pulm: No SOB or wheezing GI: No nausea or vomiting GU: No urgency or inability to hold urine Skin: No poor wound healing Neurologic: No numbness or tingling Psychiatric: No depression or anxiety Heme: No bruising Allergic: No reaction to medications or food   Exam: Blood pressure (!) 169/88, pulse 96, temperature 98.3 F (36.8 C), temperature source Oral, resp. rate 16, height 5\' 6"  (1.676 m), weight (!) 138.8 kg, SpO2 95 %. General: No acute distress Orientation: Awake alert and oriented x3 Mood and Affect: Cooperative and pleasant Gait: Unable to assess due to his fracture Coordination and balance: Within normal limits  Right lower extremity: Obvious deformity about the knee.  Pain with any palpation or movement.  Compartments are soft compressible.  He has active dorsiflexion plantarflexion to his foot and ankle.  He is chronic venous insufficiency changes to his lower extremity.  He has a warm well-perfused toes and foot with a brisk cap refill.  Moderate swelling to the right thigh.  Patient is otherwise neuro intact  Left  lower extremity: Skin without lesions. No tenderness to palpation. Full painless ROM, full strength in each muscle groups without evidence of instability.   Medical Decision Making: Data: Imaging: X-rays of the right knee show a comminuted supracondylar distal femur fracture without significant intracondylar extension.  There is a nondisplaced patella fracture without significant distraction at the fracture site.  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  Labs:  Results for orders placed or performed during the hospital encounter of 02/03/21 (from the past 24 hour(s))  Resp Panel by RT-PCR (Flu A&B, Covid) Nasopharyngeal Swab     Status: None   Collection Time: 02/03/21 11:45 PM   Specimen: Nasopharyngeal Swab; Nasopharyngeal(NP) swabs in vial transport medium  Result Value Ref Range   SARS Coronavirus 2 by RT PCR NEGATIVE NEGATIVE   Influenza A by PCR NEGATIVE NEGATIVE   Influenza B by PCR NEGATIVE NEGATIVE  CBC with Differential     Status: Abnormal   Collection Time: 02/03/21 11:55 PM  Result Value Ref Range   WBC 9.2 4.0 - 10.5 K/uL   RBC 4.02 (L) 4.22 - 5.81 MIL/uL   Hemoglobin 11.5 (L) 13.0 - 17.0 g/dL   HCT 38.9 (L) 39.0 - 52.0 %   MCV 96.8 80.0 - 100.0 fL   MCH 28.6 26.0 - 34.0 pg   MCHC 29.6 (L) 30.0 - 36.0 g/dL   RDW 14.4 11.5 - 15.5 %   Platelets 127 (L) 150 - 400 K/uL   nRBC 0.0 0.0 - 0.2 %   Neutrophils Relative % 80 %   Neutro Abs 7.3 1.7 - 7.7 K/uL   Lymphocytes Relative 7 %   Lymphs Abs 0.7 0.7 - 4.0 K/uL   Monocytes Relative 9 %   Monocytes Absolute 0.9 0.1 - 1.0 K/uL   Eosinophils Relative 2 %   Eosinophils Absolute 0.2 0.0 - 0.5 K/uL   Basophils Relative 1 %   Basophils Absolute 0.1 0.0 - 0.1 K/uL   Immature Granulocytes 1 %   Abs Immature Granulocytes 0.05 0.00 - 0.07 K/uL  Comprehensive metabolic panel     Status: Abnormal   Collection Time: 02/03/21 11:55 PM  Result Value Ref Range   Sodium 140 135 -  145 mmol/L   Potassium 5.0 3.5 - 5.1 mmol/L   Chloride 105 98 - 111 mmol/L   CO2 29 22 - 32 mmol/L   Glucose, Bld 143 (H) 70 - 99 mg/dL  BUN 18 8 - 23 mg/dL   Creatinine, Ser 0.98 0.61 - 1.24 mg/dL   Calcium 8.6 (L) 8.9 - 10.3 mg/dL   Total Protein 5.2 (L) 6.5 - 8.1 g/dL   Albumin 2.5 (L) 3.5 - 5.0 g/dL   AST 28 15 - 41 U/L   ALT 16 0 - 44 U/L   Alkaline Phosphatase 77 38 - 126 U/L   Total Bilirubin 1.0 0.3 - 1.2 mg/dL   GFR, Estimated >60 >60 mL/min   Anion gap 6 5 - 15  Protime-INR     Status: None   Collection Time: 02/03/21 11:55 PM  Result Value Ref Range   Prothrombin Time 14.3 11.4 - 15.2 seconds   INR 1.1 0.8 - 1.2  Magnesium     Status: Abnormal   Collection Time: 02/03/21 11:55 PM  Result Value Ref Range   Magnesium 1.5 (L) 1.7 - 2.4 mg/dL  Magnesium     Status: None   Collection Time: 02/04/21  3:51 AM  Result Value Ref Range   Magnesium 2.0 1.7 - 2.4 mg/dL  Comprehensive metabolic panel     Status: Abnormal   Collection Time: 02/04/21  3:51 AM  Result Value Ref Range   Sodium 138 135 - 145 mmol/L   Potassium 4.6 3.5 - 5.1 mmol/L   Chloride 100 98 - 111 mmol/L   CO2 29 22 - 32 mmol/L   Glucose, Bld 172 (H) 70 - 99 mg/dL   BUN 18 8 - 23 mg/dL   Creatinine, Ser 1.09 0.61 - 1.24 mg/dL   Calcium 9.6 8.9 - 10.3 mg/dL   Total Protein 6.5 6.5 - 8.1 g/dL   Albumin 3.0 (L) 3.5 - 5.0 g/dL   AST 21 15 - 41 U/L   ALT 19 0 - 44 U/L   Alkaline Phosphatase 95 38 - 126 U/L   Total Bilirubin 1.2 0.3 - 1.2 mg/dL   GFR, Estimated >60 >60 mL/min   Anion gap 9 5 - 15  CBC     Status: Abnormal   Collection Time: 02/04/21  3:51 AM  Result Value Ref Range   WBC 11.9 (H) 4.0 - 10.5 K/uL   RBC 4.52 4.22 - 5.81 MIL/uL   Hemoglobin 13.3 13.0 - 17.0 g/dL   HCT 43.4 39.0 - 52.0 %   MCV 96.0 80.0 - 100.0 fL   MCH 29.4 26.0 - 34.0 pg   MCHC 30.6 30.0 - 36.0 g/dL   RDW 14.4 11.5 - 15.5 %   Platelets 139 (L) 150 - 400 K/uL   nRBC 0.0 0.0 - 0.2 %  Protime-INR     Status: None    Collection Time: 02/04/21  3:51 AM  Result Value Ref Range   Prothrombin Time 14.0 11.4 - 15.2 seconds   INR 1.1 0.8 - 1.2  Glucose, capillary     Status: Abnormal   Collection Time: 02/04/21  7:07 AM  Result Value Ref Range   Glucose-Capillary 171 (H) 70 - 99 mg/dL  VITAMIN D 25 Hydroxy (Vit-D Deficiency, Fractures)     Status: None   Collection Time: 02/04/21  9:49 AM  Result Value Ref Range   Vit D, 25-Hydroxy 40.66 30 - 100 ng/mL  Urinalysis, Routine w reflex microscopic Urine, Clean Catch     Status: Abnormal   Collection Time: 02/04/21 10:37 AM  Result Value Ref Range   Color, Urine YELLOW YELLOW   APPearance HAZY (A) CLEAR   Specific Gravity, Urine 1.011 1.005 - 1.030  pH 6.0 5.0 - 8.0   Glucose, UA NEGATIVE NEGATIVE mg/dL   Hgb urine dipstick NEGATIVE NEGATIVE   Bilirubin Urine NEGATIVE NEGATIVE   Ketones, ur NEGATIVE NEGATIVE mg/dL   Protein, ur 100 (A) NEGATIVE mg/dL   Nitrite NEGATIVE NEGATIVE   Leukocytes,Ua LARGE (A) NEGATIVE   RBC / HPF 6-10 0 - 5 RBC/hpf   WBC, UA >50 (H) 0 - 5 WBC/hpf   Bacteria, UA FEW (A) NONE SEEN   WBC Clumps PRESENT   Glucose, capillary     Status: Abnormal   Collection Time: 02/04/21 12:01 PM  Result Value Ref Range   Glucose-Capillary 141 (H) 70 - 99 mg/dL  Glucose, capillary     Status: Abnormal   Collection Time: 02/04/21  1:20 PM  Result Value Ref Range   Glucose-Capillary 135 (H) 70 - 99 mg/dL     Imaging or Labs ordered: None  Medical history and chart was reviewed and case discussed with medical provider.  Assessment/Plan: 75 year old male with a right supracondylar distal femur fracture and associated patella fracture.  I discussed risks and benefits to surgery with the patient.  Risks include but not limited to bleeding, infection, malunion, nonunion, hardware failure, hardware irritation, nerve or blood vessel injury, persistent knee stiffness, progression of the arthritis requiring potential further surgery including  arthroplasty, DVT, even the possibility anesthetic complications.  The patient agreed to proceed with surgery and consent was obtained.  We will plan to likely treat his patella nonoperatively pending intraoperative radiographs.  Shona Needles, MD [facility] [phone] (office) [email or web address]

## 2021-02-04 NOTE — Progress Notes (Signed)
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  He was found to have an acute distal right femoral fracture, orthopedic surgery was consulted and he was admitted to the hospital.  There was no syncope/loss of consciousness it appears that this was a mechanical fall  Subjective / 24h Interval events: Doing well this morning, no significant pain unless he moves.  Prior to his fall he was in his normal state of health, no chest pain, no shortness of breath, no fever or chills.  Assessment & Plan: Principal Problem Acute distal right femoral fracture -Orthopedic surgery consulted, appreciate input.  Consult note is pending, patient appears to be on the schedule for the OR today.  Keep n.p.o. -Mobilize with PT postop, DVT prophylaxis per orthopedic surgery  Active Problems PAF-not on anticoagulation due to history of liver cirrhosis and prior GI bleeds.  He takes aspirin aspirin.  He is on Coreg at home, continue  Type 2 diabetes mellitus -most recent A1c 6.9.  Continue sliding scale  Liver cirrhosis, thrombocytopenia-diagnosed about couple years ago.  NASH/EtOH is mentioned in the GI notes.  Patient does report some EtOH use but has not been drinking for more than a decade.  Overall stable, does not appear to have any active issues but needs close monitoring perioperatively  Hypomagnesemia-repleted  BPH-continue home medications  Obesity class III-BMI 49, he would benefit from weight loss  Scheduled Meds:  insulin aspart  0-6 Units Subcutaneous Q6H   Continuous Infusions: PRN Meds:.acetaminophen **OR** acetaminophen, morphine  injection, naLOXone (NARCAN)  injection, ondansetron (ZOFRAN) IV  Diet Orders (From admission, onward)     Start     Ordered   02/04/21 0001  Diet NPO time specified  Diet effective midnight       Comments: (Regular diet up until midnight)   02/03/21 2247            DVT prophylaxis: SCDs Start: 02/03/21 2247     Code Status: DNR  Family Communication: No family at bedside  Status is: Inpatient  Remains inpatient appropriate because:Inpatient level of care appropriate due to severity of illness  Dispo: The patient is from: Home              Anticipated d/c is to: SNF              Patient currently is not medically stable to d/c.   Difficult to place patient No   Level of care: Med-Surg  Consultants:  Orthopedic surgery  Procedures:  none  Microbiology  none  Antimicrobials: none    Objective: Vitals:   02/04/21 0500 02/04/21 0643 02/04/21 0806 02/04/21 0922  BP: (!) 162/109 (!) 148/77 135/69   Pulse: 99 (!) 104 83   Resp: 17 20 16    Temp:  98.5 F (36.9 C) 98.6 F (37 C)   TempSrc:  Oral Oral   SpO2: 96% 96% 97%   Weight:    (!) 138.8 kg  Height:    5\' 6"  (1.676 m)    Intake/Output Summary (Last 24 hours) at 02/04/2021 1114 Last data filed at 02/04/2021 0336 Gross per 24 hour  Intake 40.87 ml  Output --  Net 40.87 ml   Filed Weights   02/04/21 0922  Weight: (!) 138.8 kg    Examination:  Constitutional: NAD Eyes: no scleral icterus ENMT: Mucous membranes are moist.  Neck: normal, supple Respiratory: clear to auscultation bilaterally, no wheezing, no crackles. Normal respiratory effort. No accessory muscle use.  Cardiovascular: Regular rate and rhythm, no murmurs / rubs / gallops. Trace LE edema.  Abdomen: non distended, no tenderness. Bowel sounds positive.  Musculoskeletal: no clubbing / cyanosis.  Skin: no rashes Neurologic: CN 2-12 grossly intact. Strength 5/5 in all 4.   Data Reviewed: I have independently reviewed following labs  and imaging studies   CBC: Recent Labs  Lab 02/03/21 2355 02/04/21 0351  WBC 9.2 11.9*  NEUTROABS 7.3  --   HGB 11.5* 13.3  HCT 38.9* 43.4  MCV 96.8 96.0  PLT 127* 960*   Basic Metabolic Panel: Recent Labs  Lab 02/03/21 2355 02/04/21 0351  NA 140 138  K 5.0 4.6  CL 105 100  CO2 29 29  GLUCOSE 143* 172*  BUN 18 18  CREATININE 0.98 1.09  CALCIUM 8.6* 9.6  MG 1.5* 2.0   Liver Function Tests: Recent Labs  Lab 02/03/21 2355 02/04/21 0351  AST 28 21  ALT 16 19  ALKPHOS 77 95  BILITOT 1.0 1.2  PROT 5.2* 6.5  ALBUMIN 2.5* 3.0*   Coagulation Profile: Recent Labs  Lab 02/03/21 2355 02/04/21 0351  INR 1.1 1.1   HbA1C: No results for input(s): HGBA1C in the last 72 hours. CBG: Recent Labs  Lab 02/04/21 0707  GLUCAP 171*    Recent Results (from the past 240 hour(s))  Resp Panel by RT-PCR (Flu A&B, Covid) Nasopharyngeal Swab     Status: None   Collection Time: 02/03/21 11:45 PM   Specimen: Nasopharyngeal Swab; Nasopharyngeal(NP) swabs in vial transport medium  Result Value Ref Range Status   SARS Coronavirus 2 by RT PCR NEGATIVE NEGATIVE Final    Comment: (NOTE) SARS-CoV-2 target nucleic acids are NOT DETECTED.  The SARS-CoV-2 RNA is generally detectable in upper respiratory specimens during the acute phase of infection. The lowest concentration of SARS-CoV-2 viral copies this assay can detect is 138 copies/mL. A negative result does not preclude SARS-Cov-2 infection and should not be used as the sole basis for treatment or other patient management decisions. A negative result may occur with  improper specimen collection/handling, submission of specimen other than nasopharyngeal swab, presence of viral mutation(s) within the areas targeted by this assay, and inadequate number of viral copies(<138 copies/mL). A negative result must be combined with clinical observations, patient history, and epidemiological information. The expected result is  Negative.  Fact Sheet for Patients:  EntrepreneurPulse.com.au  Fact Sheet for Healthcare Providers:  IncredibleEmployment.be  This test is no t yet approved or cleared by the [place] FDA and  has been authorized for detection and/or diagnosis of SARS-CoV-2 by FDA under an Emergency Use Authorization (EUA). This EUA will remain  in effect (meaning this test can be used) for the duration of the COVID-19 declaration under Section 564(b)(1) of the Act, 21 U.S.C.section 360bbb-3(b)(1), unless the authorization is terminated  or revoked sooner.       Influenza A by PCR NEGATIVE NEGATIVE Final   Influenza B by PCR NEGATIVE NEGATIVE Final    Comment: (NOTE) The Xpert Xpress SARS-CoV-2/FLU/RSV plus assay is intended as an aid in the diagnosis of influenza from Nasopharyngeal swab specimens and should not be used as a sole  basis for treatment. Nasal washings and aspirates are unacceptable for Xpert Xpress SARS-CoV-2/FLU/RSV testing.  Fact Sheet for Patients: EntrepreneurPulse.com.au  Fact Sheet for Healthcare Providers: IncredibleEmployment.be  This test is not yet approved or cleared by the [place] FDA and has been authorized for detection and/or diagnosis of SARS-CoV-2 by FDA under an Emergency Use Authorization (EUA). This EUA will remain in effect (meaning this test can be used) for the duration of the COVID-19 declaration under Section 564(b)(1) of the Act, 21 U.S.C. section 360bbb-3(b)(1), unless the authorization is terminated or revoked.  Performed at [facility], [place] [address], [place], [place] [address]      Radiology Studies: DG Chest 2 View  Result Date: 02/03/2021 CLINICAL DATA:  Preoperative radiography EXAM: CHEST - 2 VIEW COMPARISON:  09/16/2019 FINDINGS: Prior sternotomy and CABG. Cardiomegaly. Tortuous and calcified aorta. Remaining cardiomediastinal contours are  unremarkable. Pulmonary vascular congestion with indistinct and cephalized pulmonary vascularity. Some hazy reticular opacities are present with mild fissural thickening and few septal lines which could reflect combination of atelectasis and mild interstitial edema. No consolidative process. No pneumothorax. No effusion. Degenerative changes in the shoulders, right greater than left. Multilevel degenerative changes are present in the imaged portions of the spine. IMPRESSION: Appearance most compatible with mild CHF/volume overload with cardiomegaly, vascular congestion and hazy interstitial opacity suggestive of pulmonary edema. Aortic Atherosclerosis (ICD10-I70.0). Electronically Signed   By: Lovena Le M.D.   On: 02/03/2021 23:00   DG Pelvis 1-2 Views  Result Date: 02/03/2021 CLINICAL DATA:  Pain after fall EXAM: PELVIS - 1-2 VIEW COMPARISON:  CT August 14, 2019 FINDINGS: Diffuse demineralization of bone. There is no evidence of pelvic fracture or diastasis. Degenerative change of the hips. Lower lumbar spondylosis. IMPRESSION: No acute displaced fracture or dislocation on this single AP view of the pelvis. Electronically Signed   By: Dahlia Bailiff MD   On: 02/03/2021 22:30   DG Knee 2 Views Right  Result Date: 02/03/2021 CLINICAL DATA:  Pain after fall EXAM: RIGHT KNEE - 1-2 VIEW COMPARISON:  Radiograph September 30, 2018 FINDINGS: Diffuse demineralization of bone. Comminuted impacted fracture of the distal femoral metadiaphysis with approximately 13 mm medial displacement and lateral angulation of the distal fracture fragment, without definite intra-articular extension. Irregularity of the upper pole of patella, related to prior avulsion fracture. Joint effusion. 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 Vascular calcifications. IMPRESSION: 1. Comminuted, displaced and angulated distal femoral metadiaphyseal fracture, without definite intra-articular extension. 2. Irregularity of the  upper pole of patella, related to prior known avulsion fracture. 3. 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 Electronically Signed   By: Dahlia Bailiff MD   On: 02/03/2021 22:28   CT Head Wo Contrast  Result Date: 02/03/2021 CLINICAL DATA:  75 year old male with neck trauma. EXAM: CT HEAD WITHOUT CONTRAST CT CERVICAL SPINE WITHOUT CONTRAST TECHNIQUE: Multidetector CT imaging of the head and cervical spine was performed following the standard protocol without intravenous contrast. Multiplanar CT image reconstructions of the cervical spine were also generated. COMPARISON:  None. FINDINGS: CT HEAD FINDINGS Brain: Mild age-related atrophy and chronic microvascular ischemic changes. There is no acute intracranial hemorrhage. No mass effect or midline shift no extra-axial fluid collection. Vascular: No hyperdense vessel or unexpected calcification. Skull: Normal. Negative for fracture or focal lesion. Sinuses/Orbits: There is complete opacification of the left maxillary sinus. The remainder of the visualized paranasal sinuses and mastoid air cells are clear. No air-fluid level. Other: None CT CERVICAL SPINE FINDINGS  Alignment: No acute subluxation. There is straightening of normal cervical lordosis which may be positional or due to muscle spasm. Skull base and vertebrae: No acute fracture. Osteopenia. Soft tissues and spinal canal: No prevertebral fluid or swelling. No visible canal hematoma. Disc levels: Multilevel degenerative changes with disc space narrowing and spurring. Upper chest: Negative. Other: Bilateral carotid bulb calcified plaques. IMPRESSION: 1. No acute intracranial pathology. Mild age-related atrophy and chronic microvascular ischemic changes. 2. No acute/traumatic cervical spine pathology. Multilevel degenerative changes. Electronically Signed   By: Anner Crete M.D.   On: 02/03/2021 22:41   CT Cervical Spine Wo Contrast  Result Date:  02/03/2021 CLINICAL DATA:  75 year old male with neck trauma. EXAM: CT HEAD WITHOUT CONTRAST CT CERVICAL SPINE WITHOUT CONTRAST TECHNIQUE: Multidetector CT imaging of the head and cervical spine was performed following the standard protocol without intravenous contrast. Multiplanar CT image reconstructions of the cervical spine were also generated. COMPARISON:  None. FINDINGS: CT HEAD FINDINGS Brain: Mild age-related atrophy and chronic microvascular ischemic changes. There is no acute intracranial hemorrhage. No mass effect or midline shift no extra-axial fluid collection. Vascular: No hyperdense vessel or unexpected calcification. Skull: Normal. Negative for fracture or focal lesion. Sinuses/Orbits: There is complete opacification of the left maxillary sinus. The remainder of the visualized paranasal sinuses and mastoid air cells are clear. No air-fluid level. Other: None CT CERVICAL SPINE FINDINGS Alignment: No acute subluxation. There is straightening of normal cervical lordosis which may be positional or due to muscle spasm. Skull base and vertebrae: No acute fracture. Osteopenia. Soft tissues and spinal canal: No prevertebral fluid or swelling. No visible canal hematoma. Disc levels: Multilevel degenerative changes with disc space narrowing and spurring. Upper chest: Negative. Other: Bilateral carotid bulb calcified plaques. IMPRESSION: 1. No acute intracranial pathology. Mild age-related atrophy and chronic microvascular ischemic changes. 2. No acute/traumatic cervical spine pathology. Multilevel degenerative changes. Electronically Signed   By: Anner Crete M.D.   On: 02/03/2021 22:41   CT Knee Right Wo Contrast  Result Date: 02/03/2021 CLINICAL DATA:  Recent fall with known distal right femoral fracture EXAM: CT OF THE RIGHT KNEE WITHOUT CONTRAST TECHNIQUE: Multidetector CT imaging of the right knee was performed according to the standard protocol. Multiplanar CT image reconstructions were also  generated. COMPARISON:  Plain film from earlier in the same day as well as prior CT from 09/30/2018 FINDINGS: Bones/Joint/Cartilage There is again noted a comminuted fracture of the distal right femur involving primarily the distal aspect of the diaphysis and proximal metaphysis. Posterior and lateral displacement of the distal fracture fragments is noted. Some mild impaction is seen as well with approximately 1 cm of bony overlap along the medial aspect of the distal femur. No articular involvement is identified. Osteophytic changes are noted in all 3 joint compartments. Proximal tibia and fibula show no acute fracture. Further fragmentation in the anterior and inferior aspect of the patella is noted although no significant distraction is seen. Ligaments Suboptimally assessed by CT. Muscles and Tendons Surrounding musculature demonstrates some fatty infiltration likely related to disuse atrophy. Soft tissues Joint effusion is noted. Some prepatellar soft tissue swelling and surrounding subcutaneous edema is noted as well. IMPRESSION: Comminuted and displaced distal right femoral fracture without significant intra-articular involvement. Associated soft tissue swelling and joint effusion is noted. Further displacement of patellar fracture when compared with the previous exam. No distraction of the patellar fragments is seen however. Tricompartmental degenerative changes. Electronically Signed   By: Inez Catalina M.D.   On:  02/03/2021 23:06    Marzetta Board, MD, PhD [facility]  Between 7 am - 7 pm I am available, please contact me via Amion (for emergencies) or Securechat (non urgent messages)  Between 7 pm - 7 am I am not available, please contact night coverage MD/APP via Amion

## 2021-02-04 NOTE — Anesthesia Preprocedure Evaluation (Addendum)
Anesthesia Evaluation  Patient identified by MRN, date of birth, ID band Patient awake    Reviewed: Allergy & Precautions, NPO status , Patient's Chart, lab work & pertinent test results, reviewed documented beta blocker date and time   Airway Mallampati: II  TM Distance: >3 FB Neck ROM: Full    Dental  (+) Dental Advisory Given, Upper Dentures, Poor Dentition, Missing   Pulmonary former smoker,    Pulmonary exam normal breath sounds clear to auscultation       Cardiovascular hypertension, Pt. on home beta blockers (-) angina+ CAD, + Past MI and + CABG  (-) Cardiac Stents + dysrhythmias Atrial Fibrillation + Valvular Problems/Murmurs AS  Rhythm:Irregular Rate:Abnormal     Neuro/Psych negative neurological ROS  negative psych ROS   GI/Hepatic GERD  Medicated,(+) Cirrhosis       , S/p partial colectomy    Endo/Other  diabetes, Type 2, Oral Hypoglycemic AgentsMorbid obesity  Renal/GU negative Renal ROS     Musculoskeletal Right distal femur fracture    Abdominal   Peds  Hematology  (+) Blood dyscrasia (Thrombocytopenia), ,   Anesthesia Other Findings Day of surgery medications reviewed with the patient.  Reproductive/Obstetrics                            Anesthesia Physical Anesthesia Plan  ASA: 3  Anesthesia Plan: General   Post-op Pain Management:    Induction: Intravenous  PONV Risk Score and Plan: 2 and Dexamethasone and Ondansetron  Airway Management Planned: Oral ETT  Additional Equipment:   Intra-op Plan:   Post-operative Plan: Extubation in OR  Informed Consent: I have reviewed the patients History and Physical, chart, labs and discussed the procedure including the risks, benefits and alternatives for the proposed anesthesia with the patient or authorized representative who has indicated his/her understanding and acceptance.   Patient has DNR.  Suspend DNR and  Discussed DNR with patient.   Dental advisory given  Plan Discussed with: CRNA  Anesthesia Plan Comments:        Anesthesia Quick Evaluation

## 2021-02-04 NOTE — Anesthesia Procedure Notes (Signed)
Procedure Name: Intubation Date/Time: 02/04/2021 4:08 PM Performed by: Jonna Munro, CRNA Pre-anesthesia Checklist: Patient identified, Patient being monitored, Timeout performed, Emergency Drugs available and Suction available Patient Re-evaluated:Patient Re-evaluated prior to induction Oxygen Delivery Method: Circle System Utilized Preoxygenation: Pre-oxygenation with 100% oxygen Induction Type: IV induction Ventilation: Mask ventilation without difficulty Laryngoscope Size: Mac and 3 Grade View: Grade II Tube type: Oral Tube size: 7.5 mm Number of attempts: 1 Airway Equipment and Method: stylet Placement Confirmation: ETT inserted through vocal cords under direct vision, positive ETCO2 and breath sounds checked- equal and bilateral Secured at: 21 cm Tube secured with: Tape Dental Injury: Teeth and Oropharynx as per pre-operative assessment

## 2021-02-05 ENCOUNTER — Encounter (HOSPITAL_COMMUNITY): Payer: Self-pay | Admitting: Student

## 2021-02-05 DIAGNOSIS — S72451A Displaced supracondylar fracture without intracondylar extension of lower end of right femur, initial encounter for closed fracture: Secondary | ICD-10-CM

## 2021-02-05 LAB — CBC
HCT: 32.2 % — ABNORMAL LOW (ref 39.0–52.0)
Hemoglobin: 9.8 g/dL — ABNORMAL LOW (ref 13.0–17.0)
MCH: 29.4 pg (ref 26.0–34.0)
MCHC: 30.4 g/dL (ref 30.0–36.0)
MCV: 96.7 fL (ref 80.0–100.0)
Platelets: 108 10*3/uL — ABNORMAL LOW (ref 150–400)
RBC: 3.33 MIL/uL — ABNORMAL LOW (ref 4.22–5.81)
RDW: 14.5 % (ref 11.5–15.5)
WBC: 8.8 10*3/uL (ref 4.0–10.5)
nRBC: 0 % (ref 0.0–0.2)

## 2021-02-05 LAB — BASIC METABOLIC PANEL
Anion gap: 3 — ABNORMAL LOW (ref 5–15)
BUN: 27 mg/dL — ABNORMAL HIGH (ref 8–23)
CO2: 33 mmol/L — ABNORMAL HIGH (ref 22–32)
Calcium: 8.8 mg/dL — ABNORMAL LOW (ref 8.9–10.3)
Chloride: 103 mmol/L (ref 98–111)
Creatinine, Ser: 1.66 mg/dL — ABNORMAL HIGH (ref 0.61–1.24)
GFR, Estimated: 43 mL/min — ABNORMAL LOW (ref >60)
Glucose, Bld: 138 mg/dL — ABNORMAL HIGH (ref 70–99)
Potassium: 5 mmol/L (ref 3.5–5.1)
Sodium: 139 mmol/L (ref 135–145)

## 2021-02-05 LAB — GLUCOSE, CAPILLARY
Glucose-Capillary: 124 mg/dL — ABNORMAL HIGH (ref 70–99)
Glucose-Capillary: 139 mg/dL — ABNORMAL HIGH (ref 70–99)
Glucose-Capillary: 149 mg/dL — ABNORMAL HIGH (ref 70–99)
Glucose-Capillary: 162 mg/dL — ABNORMAL HIGH (ref 70–99)
Glucose-Capillary: 163 mg/dL — ABNORMAL HIGH (ref 70–99)

## 2021-02-05 NOTE — TOC Initial Note (Signed)
Transition of Care [facility]) - Initial/Assessment Note    Patient Details  Name: Brandon [name] MRN: [id number] Date of Birth: 09/09/1945  [facility] CM/SW Contact:    Milinda Antis, [place] Phone Number: 02/05/2021, 3:51 PM  Clinical Narrative:                 CSW received consult for possible SNF placement at time of discharge. CSW spoke with patient. Patient expressed understanding of PT recommendation and is agreeable to SNF placement at time of discharge. Patient did not report a preference and agreed for CSW to fax referral to different agencies. CSW discussed insurance authorization process and provided Medicare SNF ratings list. Patient reports having received the COVID vaccines. Patient expressed being hopeful for rehab and to feel better soon. No further questions reported at this time.     Expected Discharge Plan: Skilled Nursing Facility Barriers to Discharge: Insurance Authorization, SNF Pending bed offer, Continued Medical Work up   Patient Goals and CMS Choice   CMS Medicare.gov Compare Post Acute Care list provided to:: Patient Choice offered to / list presented to : Patient  Expected Discharge Plan and Services Expected Discharge Plan: [place]                                              Prior Living Arrangements/Services   Lives with:: Self Patient language and need for interpreter reviewed:: Yes Do you feel safe going back to the place where you live?: Yes      Need for Family Participation in Patient Care: No (Comment) Care giver support system in place?: Yes (comment)   Criminal Activity/Legal Involvement Pertinent to Current Situation/Hospitalization: No - Comment as needed  Activities of Daily Living Home Assistive Devices/Equipment: Shower chair without back, Walker (specify type), Bedside commode/3-in-1 ADL Screening (condition at time of admission) Patient's cognitive ability adequate to safely complete daily  activities?: Yes Is the patient deaf or have difficulty hearing?: No Does the patient have difficulty seeing, even when wearing glasses/contacts?: No Does the patient have difficulty concentrating, remembering, or making decisions?: No Patient able to express need for assistance with ADLs?: Yes Does the patient have difficulty dressing or bathing?: Yes Independently performs ADLs?: Yes (appropriate for developmental age) Does the patient have difficulty walking or climbing stairs?: Yes Weakness of Legs: Both Weakness of Arms/Hands: Both  Permission Sought/Granted   Permission granted to share information with : Yes, Verbal Permission Granted     Permission granted to share info w AGENCY: SNF        Emotional Assessment   Attitude/Demeanor/Rapport: Engaged Affect (typically observed): Accepting Orientation: : Oriented to Self, Oriented to Place, Oriented to  Time, Oriented to Situation Alcohol / Substance Use: Not Applicable Psych Involvement: No (comment)  Admission diagnosis:  Fall, initial encounter [W19.XXXA] Acute pain of right knee [M25.561] Stress fracture, right femur, initial encounter for fracture [G26.948N] Patient Active Problem List   Diagnosis Date Noted   Disp supracondyl fx w/o intracondylar extension of right distal femur ([place]) 02/05/2021   Hypomagnesemia 02/04/2021   Fall at home, initial encounter 02/04/2021   Cirrhosis of liver (HCC)    Chronic heart failure with preserved ejection fraction (HFpEF) ([place]) 06/07/2020   Hyperkalemia 06/07/2020   Weakness generalized 06/07/2020   Physical deconditioning 06/06/2020   Hypothermia 05/31/2020   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   Alcoholic  cirrhosis of liver with ascites ([place])    Portal hypertensive gastropathy ([place])    Melena 10/02/2019   Angiodysplasia of stomach with hemorrhage    Gastrointestinal hemorrhage    Atrial fibrillation (HCC)    Chronic pain syndrome    Symptomatic anemia 08/13/2019   Acute upper  GI bleed 08/13/2019   Anemia 07/2019   Avulsion of right patellar tendon    Closed displaced fracture of phalanx of toe of right foot 10/06/2018   Obesity BMI over 52 09/30/2018   Patella fracture, extensor mechanism disruption R knee  09/29/2018   History of colon cancer 08/23/2018   CAD (coronary artery disease) 08/23/2018   DM (diabetes mellitus), type 2 ([place]) 08/23/2018   Pyuria 08/23/2018   PCP:  Helane Rima, MD Pharmacy:   [id number] - [place], [place] - [address] AT [place] [address] [place] [address] Phone: [phone] Fax: [phone]     Social Determinants of Health (SDOH) Interventions    Readmission Risk Interventions Readmission Risk Prevention Plan 10/06/2019 10/06/2019  Transportation Screening - Complete  PCP or Specialist Appt within 3-5 Days Complete -  HRI or [place] - Complete  Social Work Consult for [place] Planning/Counseling - Complete  Palliative Care Screening - Not Applicable  Medication Review Press photographer) - Complete

## 2021-02-05 NOTE — Progress Notes (Addendum)
Orthopaedic Trauma Progress Note  SUBJECTIVE: Doing ok, pain manageable with current regimen. No chest pain. No SOB. No nausea/vomiting. No other complaints. Has not been out of bed yet since surgery. About to eat breakfast  OBJECTIVE:  Vitals:   02/05/21 0424 02/05/21 0741  BP: (!) 101/58 111/61  Pulse: 88 87  Resp: 16 18  Temp: 98 F (36.7 C) 98.4 F (36.9 C)  SpO2: 95% 98%    General: Sitting up in bed, NAD Respiratory: No increased work of breathing.  RLE: Dressing CDI, no significant tenderness throughout leg. Ankle DF/PF intact. Compartments soft and compressible. Foot warm and well perfused  IMAGING: Stable post op imaging.   LABS:  Results for orders placed or performed during the hospital encounter of 02/03/21 (from the past 24 hour(s))  VITAMIN D 25 Hydroxy (Vit-D Deficiency, Fractures)     Status: None   Collection Time: 02/04/21  9:49 AM  Result Value Ref Range   Vit D, 25-Hydroxy 40.66 30 - 100 ng/mL  Urinalysis, Routine w reflex microscopic Urine, Clean Catch     Status: Abnormal   Collection Time: 02/04/21 10:37 AM  Result Value Ref Range   Color, Urine YELLOW YELLOW   APPearance HAZY (A) CLEAR   Specific Gravity, Urine 1.011 1.005 - 1.030   pH 6.0 5.0 - 8.0   Glucose, UA NEGATIVE NEGATIVE mg/dL   Hgb urine dipstick NEGATIVE NEGATIVE   Bilirubin Urine NEGATIVE NEGATIVE   Ketones, ur NEGATIVE NEGATIVE mg/dL   Protein, ur 100 (A) NEGATIVE mg/dL   Nitrite NEGATIVE NEGATIVE   Leukocytes,Ua LARGE (A) NEGATIVE   RBC / HPF 6-10 0 - 5 RBC/hpf   WBC, UA >50 (H) 0 - 5 WBC/hpf   Bacteria, UA FEW (A) NONE SEEN   WBC Clumps PRESENT   Glucose, capillary     Status: Abnormal   Collection Time: 02/04/21 12:01 PM  Result Value Ref Range   Glucose-Capillary 141 (H) 70 - 99 mg/dL  Glucose, capillary     Status: Abnormal   Collection Time: 02/04/21  1:20 PM  Result Value Ref Range   Glucose-Capillary 135 (H) 70 - 99 mg/dL  Glucose, capillary     Status: Abnormal    Collection Time: 02/04/21  5:18 PM  Result Value Ref Range   Glucose-Capillary 132 (H) 70 - 99 mg/dL   Comment 1 Notify RN    Comment 2 Document in Chart   Glucose, capillary     Status: Abnormal   Collection Time: 02/04/21  6:19 PM  Result Value Ref Range   Glucose-Capillary 123 (H) 70 - 99 mg/dL  CBC     Status: Abnormal   Collection Time: 02/04/21  6:29 PM  Result Value Ref Range   WBC 11.0 (H) 4.0 - 10.5 K/uL   RBC 3.85 (L) 4.22 - 5.81 MIL/uL   Hemoglobin 11.3 (L) 13.0 - 17.0 g/dL   HCT 36.9 (L) 39.0 - 52.0 %   MCV 95.8 80.0 - 100.0 fL   MCH 29.4 26.0 - 34.0 pg   MCHC 30.6 30.0 - 36.0 g/dL   RDW 14.6 11.5 - 15.5 %   Platelets 139 (L) 150 - 400 K/uL   nRBC 0.0 0.0 - 0.2 %  Creatinine, serum     Status: None   Collection Time: 02/04/21  6:29 PM  Result Value Ref Range   Creatinine, Ser 1.20 0.61 - 1.24 mg/dL   GFR, Estimated >60 >60 mL/min  VITAMIN D 25 Hydroxy (Vit-D Deficiency, Fractures)  Status: None   Collection Time: 02/04/21  6:29 PM  Result Value Ref Range   Vit D, 25-Hydroxy 38.12 30 - 100 ng/mL  Glucose, capillary     Status: Abnormal   Collection Time: 02/05/21 12:15 AM  Result Value Ref Range   Glucose-Capillary 163 (H) 70 - 99 mg/dL  Basic metabolic panel     Status: Abnormal   Collection Time: 02/05/21  3:10 AM  Result Value Ref Range   Sodium 139 135 - 145 mmol/L   Potassium 5.0 3.5 - 5.1 mmol/L   Chloride 103 98 - 111 mmol/L   CO2 33 (H) 22 - 32 mmol/L   Glucose, Bld 138 (H) 70 - 99 mg/dL   BUN 27 (H) 8 - 23 mg/dL   Creatinine, Ser 1.66 (H) 0.61 - 1.24 mg/dL   Calcium 8.8 (L) 8.9 - 10.3 mg/dL   GFR, Estimated 43 (L) >60 mL/min   Anion gap 3 (L) 5 - 15  CBC     Status: Abnormal   Collection Time: 02/05/21  3:10 AM  Result Value Ref Range   WBC 8.8 4.0 - 10.5 K/uL   RBC 3.33 (L) 4.22 - 5.81 MIL/uL   Hemoglobin 9.8 (L) 13.0 - 17.0 g/dL   HCT 32.2 (L) 39.0 - 52.0 %   MCV 96.7 80.0 - 100.0 fL   MCH 29.4 26.0 - 34.0 pg   MCHC 30.4 30.0 - 36.0  g/dL   RDW 14.5 11.5 - 15.5 %   Platelets 108 (L) 150 - 400 K/uL   nRBC 0.0 0.0 - 0.2 %  Glucose, capillary     Status: Abnormal   Collection Time: 02/05/21  6:11 AM  Result Value Ref Range   Glucose-Capillary 124 (H) 70 - 99 mg/dL    ASSESSMENT: Brandon [name] is a 75 y.o. male, 1 Day Post-Op  s/p ORIF RIGHT DISTAL FEMUR FRACTURE  CV/Blood loss: Acute blood loss anemia, Hgb 9.8 this morning. Hemodynamically stable  PLAN: Weightbearing: TDWB RLE Incisional and dressing care:  Plan to remove tomorrow Showering: Hold off until dressings removed Orthopedic device(s): None  Pain management:  1. Tylenol 325-650 mg q 6 hours PRN 2. Robaxin 500 mg q 6 hours PRN 3. Norco 5-325 OR 7.5-325 mg q 4 hours PRN 4. Morphine 0.5-1 mg q 2 hours PRN VTE prophylaxis: Lovenox, SCDs ID:  Ancef 2gm post op Foley/Lines:  No foley, KVO IVFs Impediments to Fracture Healing: Vit D level 38, no supplementation needed Dispo: PT/OT eval today, dispo pending. Okay for discharge from ortho standpoint once cleared by medicine team and therapies Follow - up plan: 2 weeks  Contact information:  Katha Hamming MD, Patrecia Pace PA-C. After hours and holidays please check [email or web address] for group call information for [facility]   Constance Hackenberg A. Ricci Barker, PA-C [phone] (office) [email or web address]

## 2021-02-05 NOTE — TOC CAGE-AID Note (Deleted)
Transition of Care [facility]) - CAGE-AID Screening   Patient Details  Name: Brandon [name] MRN: [id number] Date of Birth: 1945-11-16  Transition of Care [facility]) CM/SW Contact:    Bomani Oommen C Tarpley-Carter, [place] Phone Number: 02/05/2021, 9:13 AM   Clinical Narrative: Pt is unable to participate in Cage Aid, due to intubation.  Elzabeth Mcquerry Tarpley-Carter, MSW, LCSW-A Pronouns:  She/Her/Hers                          [place] Clinical Social WorkerTransitions of Care Cell:  [phone] [email or web address]    CAGE-AID Screening: Substance Abuse Screening unable to be completed due to: : Patient unable to participate ((Pt is intubated))

## 2021-02-05 NOTE — Evaluation (Signed)
Physical Therapy Evaluation Patient Details Name: Brandon [name] MRN: [id number] DOB: 08-Nov-1945 Today's Date: 02/05/2021   History of Present Illness  75 yo male wiht onset of mechanical fall from a trip at home, came down on R knee.  Had ORIF R distal femur and R patella was noted to be undisplaced.  Permitted ROM to R knee and TDWB.  PMHx:  PAF, GI bleed, cirrhosis, chronic anemia, DM, thrombocytopenia  Clinical Impression  Pt was seen for initial eval with PT and OT sharing the tx session due to pt's struggle with mobility and pain management of RLE.  He is 2 mod max assist to sit up and became light headed while sitting.  Returned to bed without having been able to scoot up side of bed or attempt standing.  Follow along with him to get back to more independence with  movement, and recommend SNF since his gait distances were quite short previously and were hindered by fall risk.  Will focus on getting him to slide into drop arm chair and work on trying to initiate standing as his strength and limited WB on RLE allow.    Follow Up Recommendations SNF    Equipment Recommendations  None recommended by PT    Recommendations for Other Services       Precautions / Restrictions Precautions Precautions: Fall Precaution Comments: on O2 and monitor sats Restrictions Weight Bearing Restrictions: Yes RLE Weight Bearing: Touchdown weight bearing Other Position/Activity Restrictions: painful to bend RLE      Mobility  Bed Mobility Overal bed mobility: Needs Assistance Bed Mobility: Supine to Sit;Sit to Supine     Supine to sit: Mod assist;Max assist;+2 for physical assistance;+2 for safety/equipment Sit to supine: Mod assist;Max assist;+2 for physical assistance;+2 for safety/equipment   General bed mobility comments: Pt is weak and struggling with control of trunk and legs, in part over concern that his LLE is going to be painful    Transfers Overall transfer level: Needs assistance                General transfer comment: declined to try to stand, feels light headed, very painful R foot/LE, fearful of falling and asked to go back to bed  Ambulation/Gait             General Gait Details: unable  Stairs            Wheelchair Mobility    Modified Rankin (Stroke Patients Only)       Balance Overall balance assessment: History of Falls;Needs assistance Sitting-balance support: No upper extremity supported;Feet supported Sitting balance-Leahy Scale: Fair Sitting balance - Comments: fair once seated Postural control: Posterior lean;Right lateral lean                                   Pertinent Vitals/Pain Pain Assessment: Faces Faces Pain Scale: Hurts whole lot Pain Location: R knee with movement Pain Descriptors / Indicators: Grimacing;Guarding Pain Intervention(s): Limited activity within patient's tolerance;Monitored during session;Premedicated before session;Repositioned    Home Living Family/patient expects to be discharged to:: Private residence Living Arrangements: Parent Available Help at Discharge: Family;Available 24 hours/day Type of Home: Apartment Home Access: Level entry     Home Layout: One level Home Equipment: Walker - 2 wheels;Tub bench;Grab bars - tub/shower;Grab bars - toilet;Toilet riser Additional Comments: Pt. has food and medicine delivered.    Prior Function Level of Independence: Needs assistance   Gait /  Transfers Assistance Needed: Reports uses RW but having difficulty and frequent falls; limited community distances  ADL's / Homemaking Assistance Needed: Pt does sponge baths but reports independent with ADLs  Comments: sleeps in recliner; does not drive     Hand Dominance   Dominant Hand: Right    Extremity/Trunk Assessment   Upper Extremity Assessment Upper Extremity Assessment: Defer to OT evaluation    Lower Extremity Assessment Lower Extremity Assessment: RLE  deficits/detail RLE Deficits / Details: weak and painful, intolerant of IR and flexion of knee RLE Coordination: decreased gross motor    Cervical / Trunk Assessment Cervical / Trunk Assessment: Kyphotic  Communication   Communication: No difficulties  Cognition Arousal/Alertness: Awake/alert Behavior During Therapy: WFL for tasks assessed/performed Overall Cognitive Status: Within Functional Limits for tasks assessed                                        General Comments General comments (skin integrity, edema, etc.): Pt was unable to stand and unable to scoot on the bed despite attempts    Exercises     Assessment/Plan    PT Assessment Patient needs continued PT services  PT Problem List Decreased strength;Decreased range of motion;Decreased activity tolerance;Decreased balance;Decreased mobility;Decreased coordination;Decreased cognition;Obesity;Decreased skin integrity;Decreased knowledge of precautions       PT Treatment Interventions DME instruction;Gait training;Functional mobility training;Therapeutic activities;Therapeutic exercise;Balance training;Neuromuscular re-education;Patient/family education    PT Goals (Current goals can be found in the Care Plan section)  Acute Rehab PT Goals Patient Stated Goal: To get back home again PT Goal Formulation: With patient Time For Goal Achievement: 02/19/21 Potential to Achieve Goals: Good    Frequency Min 3X/week   Barriers to discharge Inaccessible home environment;Decreased caregiver support home alone with drop in family help    Co-evaluation PT/OT/SLP Co-Evaluation/Treatment: Yes Reason for Co-Treatment: Complexity of the patient's impairments (multi-system involvement);To address functional/ADL transfers;For patient/therapist safety PT goals addressed during session: Mobility/safety with mobility;Balance OT goals addressed during session: ADL's and self-care       AM-PAC PT "6 Clicks" Mobility   Outcome Measure Help needed turning from your back to your side while in a flat bed without using bedrails?: A Lot Help needed moving from lying on your back to sitting on the side of a flat bed without using bedrails?: A Lot Help needed moving to and from a bed to a chair (including a wheelchair)?: A Lot Help needed standing up from a chair using your arms (e.g., wheelchair or bedside chair)?: A Lot Help needed to walk in hospital room?: Total Help needed climbing 3-5 steps with a railing? : Total 6 Click Score: 10    End of Session   Activity Tolerance: Patient limited by pain;Patient limited by fatigue Patient left: in bed;with call bell/phone within reach;with bed alarm set Nurse Communication: Mobility status;Other (comment) (repositioned LLE to elevate) PT Visit Diagnosis: Muscle weakness (generalized) (M62.81);History of falling (Z91.81);Pain;Difficulty in walking, not elsewhere classified (R26.2) Pain - Right/Left: Left Pain - part of body: Leg;Knee;Hip    Time: 9449-6759 PT Time Calculation (min) (ACUTE ONLY): 30 min   Charges:   PT Evaluation $PT Eval Moderate Complexity: 1 Mod         [name] 02/05/2021, 3:24 PM  Mee Hives, PT MS Acute Rehab Dept. Number: [place] [facility] [place]

## 2021-02-05 NOTE — Progress Notes (Signed)
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  He was found to have an acute distal right femoral fracture, orthopedic surgery was consulted and he was admitted to the hospital.  There was no syncope/loss of consciousness it appears that this was a mechanical fall  Subjective / 24h Interval events: Some soreness to his right leg, no abdominal pain, no nausea or vomiting.  No chest pain, denies any shortness of breath  Assessment & Plan: Principal Problem Acute distal right femoral fracture -Orthopedic surgery consulted, patient was taken to the OR on 7/11 and he is status post ORIF of right distal femur fracture as well as closed treatment of the patella fracture. -DVT prophylaxis with Lovenox -PT therapies pending, patient would prefer SNF/rehab  Active Problems Acute kidney injury-creatinine jumped to 1.66 today from normal values on admission.  Possibly postoperatively, I wonder whether he had some hypotensive episodes but not clear to me. -Encourage p.o. intake, keep on IV fluids today -bladder scan today -Continue to hold furosemide and spironolactone  PAF-not on anticoagulation due to history of liver cirrhosis and prior GI bleeds.  He takes aspirin aspirin.  He is on Coreg at home, continue.  Rate stable today 80s-90s  Type 2 diabetes mellitus -most recent A1c 6.9.  Continue sliding scale, CBGs are controlled  CBG (last 3)  Recent Labs    02/04/21 1819 02/05/21 0015 02/05/21 0611  GLUCAP 123* 163* 124*   Liver cirrhosis, thrombocytopenia-diagnosed about couple years ago.  NASH/EtOH is mentioned in the GI notes.   Patient does report some EtOH use but has not been drinking for more than a decade.   -Overall stable, platelets 108 today.  Continue to monitor  Hypomagnesemia-repleted  BPH-continue home medications with tamsulosin, finasteride, bladder scan today due to acute kidney injury.  Obesity class III-BMI > 40, he would benefit from weight loss  Scheduled Meds:  carvedilol  25 mg Oral BID WC   docusate sodium  100 mg Oral BID   enoxaparin (LOVENOX) injection  70 mg Subcutaneous Q24H   finasteride  5 mg Oral Daily   insulin aspart  0-6 Units Subcutaneous Q6H   pravastatin  10 mg Oral QHS   tamsulosin  0.4 mg Oral QHS   Continuous Infusions:  sodium chloride 75 mL/hr at 02/05/21 0857    ceFAZolin (ANCEF) IV 2 g (02/05/21 0613)   PRN Meds:.acetaminophen, HYDROcodone-acetaminophen, HYDROcodone-acetaminophen, metoCLOPramide **OR** metoCLOPramide (REGLAN) injection, morphine injection, naLOXone (NARCAN)  injection, ondansetron **OR** ondansetron (ZOFRAN) IV, polyethylene glycol  Diet Orders (From admission, onward)     Start     Ordered   02/04/21 1816  Diet Carb Modified Fluid consistency: Thin; Room service appropriate? Yes  Diet effective now       Question Answer Comment  Calorie Level Medium 1600-2000   Fluid consistency: Thin   Room service appropriate? Yes      02/04/21 1815            DVT prophylaxis: SCDs Start: 02/04/21 1816 SCDs Start: 02/03/21 2247     Code Status: DNR  Family Communication: No family at bedside  Status is: Inpatient  Remains inpatient appropriate  because:Inpatient level of care appropriate due to severity of illness  Dispo: The patient is from: Home              Anticipated d/c is to: SNF              Patient currently is not medically stable to d/c.   Difficult to place patient No   Level of care: Med-Surg  Consultants:  Orthopedic surgery  Procedures:  ORIF femur fracture  Microbiology  none  Antimicrobials: none     Objective: Vitals:   02/05/21 0023 02/05/21 0424 02/05/21 0500 02/05/21 0741  BP: (!) 99/53 (!) 101/58  111/61  Pulse: 97 88  87  Resp: 17 16  18   Temp: 97.8 F (36.6 C) 98 F (36.7 C)  98.4 F (36.9 C)  TempSrc: Oral Oral  Oral  SpO2: 94% 95%  98%  Weight:   (!) 157.7 kg   Height:        Intake/Output Summary (Last 24 hours) at 02/05/2021 1103 Last data filed at 02/05/2021 0500 Gross per 24 hour  Intake 2143.63 ml  Output 100 ml  Net 2043.63 ml    Filed Weights   02/04/21 0922 02/05/21 0500  Weight: (!) 138.8 kg (!) 157.7 kg    Examination:  Constitutional: Is in no distress, in bed Eyes: No scleral icterus ENMT: Moist mucous membranes Neck: normal, supple Respiratory: Lungs are clear on anterior auscultation, no wheezing heard.  Normal respiratory effort Cardiovascular: Regular rate and rhythm, no murmurs, trace edema Abdomen: Soft, nontender, nondistended, bowel sounds positive Musculoskeletal: no clubbing / cyanosis.  Skin: No rashes seen Neurologic: No focal deficits  Data Reviewed: I have independently reviewed following labs and imaging studies   CBC: Recent Labs  Lab 02/03/21 2355 02/04/21 0351 02/04/21 1829 02/05/21 0310  WBC 9.2 11.9* 11.0* 8.8  NEUTROABS 7.3  --   --   --   HGB 11.5* 13.3 11.3* 9.8*  HCT 38.9* 43.4 36.9* 32.2*  MCV 96.8 96.0 95.8 96.7  PLT 127* 139* 139* 108*    Basic Metabolic Panel: Recent Labs  Lab 02/03/21 2355 02/04/21 0351 02/04/21 1829 02/05/21 0310  NA 140 138  --  139  K 5.0 4.6  --  5.0  CL 105 100  --  103  CO2 29 29  --  33*  GLUCOSE 143* 172*  --  138*  BUN 18 18  --  27*  CREATININE 0.98 1.09 1.20 1.66*  CALCIUM 8.6* 9.6  --  8.8*  MG 1.5* 2.0  --   --     Liver Function Tests: Recent Labs  Lab 02/03/21 2355 02/04/21 0351  AST 28 21  ALT 16 19  ALKPHOS 77 95  BILITOT 1.0 1.2  PROT 5.2* 6.5  ALBUMIN 2.5* 3.0*    Coagulation Profile: Recent Labs  Lab 02/03/21 2355 02/04/21 0351   INR 1.1 1.1    HbA1C: No results for input(s): HGBA1C in the last 72 hours. CBG: Recent Labs  Lab 02/04/21 1320 02/04/21 1718 02/04/21 1819 02/05/21 0015 02/05/21 0611  GLUCAP 135* 132* 123* 163* 124*     Recent Results (from the past 240 hour(s))  Resp Panel by RT-PCR (Flu A&B, Covid) Nasopharyngeal Swab     Status: None   Collection Time: 02/03/21 11:45 PM   Specimen: Nasopharyngeal Swab; Nasopharyngeal(NP) swabs in vial transport medium  Result Value Ref Range Status   SARS Coronavirus 2 by RT PCR NEGATIVE NEGATIVE Final    Comment: (  NOTE) SARS-CoV-2 target nucleic acids are NOT DETECTED.  The SARS-CoV-2 RNA is generally detectable in upper respiratory specimens during the acute phase of infection. The lowest concentration of SARS-CoV-2 viral copies this assay can detect is 138 copies/mL. A negative result does not preclude SARS-Cov-2 infection and should not be used as the sole basis for treatment or other patient management decisions. A negative result may occur with  improper specimen collection/handling, submission of specimen other than nasopharyngeal swab, presence of viral mutation(s) within the areas targeted by this assay, and inadequate number of viral copies(<138 copies/mL). A negative result must be combined with clinical observations, patient history, and epidemiological information. The expected result is Negative.  Fact Sheet for Patients:  EntrepreneurPulse.com.au  Fact Sheet for Healthcare Providers:  IncredibleEmployment.be  This test is no t yet approved or cleared by the [place] FDA and  has been authorized for detection and/or diagnosis of SARS-CoV-2 by FDA under an Emergency Use Authorization (EUA). This EUA will remain  in effect (meaning this test can be used) for the duration of the COVID-19 declaration under Section 564(b)(1) of the Act, 21 U.S.C.section 360bbb-3(b)(1), unless the authorization  is terminated  or revoked sooner.       Influenza A by PCR NEGATIVE NEGATIVE Final   Influenza B by PCR NEGATIVE NEGATIVE Final    Comment: (NOTE) The Xpert Xpress SARS-CoV-2/FLU/RSV plus assay is intended as an aid in the diagnosis of influenza from Nasopharyngeal swab specimens and should not be used as a sole basis for treatment. Nasal washings and aspirates are unacceptable for Xpert Xpress SARS-CoV-2/FLU/RSV testing.  Fact Sheet for Patients: EntrepreneurPulse.com.au  Fact Sheet for Healthcare Providers: IncredibleEmployment.be  This test is not yet approved or cleared by the [place] FDA and has been authorized for detection and/or diagnosis of SARS-CoV-2 by FDA under an Emergency Use Authorization (EUA). This EUA will remain in effect (meaning this test can be used) for the duration of the COVID-19 declaration under Section 564(b)(1) of the Act, 21 U.S.C. section 360bbb-3(b)(1), unless the authorization is terminated or revoked.  Performed at [facility], [place] [address], [place], [place] [address]       Radiology Studies: DG Knee Right Port  Result Date: 02/04/2021 CLINICAL DATA:  Distal right femur ORIF EXAM: PORTABLE RIGHT KNEE - 1-2 VIEW COMPARISON:  02/03/2021 FINDINGS: Interval ORIF of a distal femoral metaphyseal fracture with lateral sideplate and screw fixation construct. Femoral fracture alignment is near anatomic. Mildly displaced patellar fracture appears unchanged in alignment. 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 Expected postoperative changes within the overlying soft tissues. Extensive vascular calcification. IMPRESSION: Interval ORIF of a distal femoral metaphyseal fracture, in near anatomic alignment. Electronically Signed   By: Davina Poke D.O.   On: 02/04/2021 21:23   DG C-Arm 1-60 Min  Result Date: 02/04/2021 CLINICAL DATA:  75 year old male with ORIF of the right femur. EXAM:  RIGHT FEMUR 2 VIEWS; DG C-ARM 1-60 MIN COMPARISON:  Right femur radiograph dated 02/03/2021. FINDINGS: Seven intraoperative fluoroscopic spot images provided. The total fluoroscopic time is 54 seconds with a total air kerma of 9.47 mGy. There has been interval placement of fixation sideplate screws along the distal femoral fracture. IMPRESSION: Fluoroscopic spot images of ORIF of the right femur. Electronically Signed   By: Anner Crete M.D.   On: 02/04/2021 21:21   DG FEMUR, MIN 2 VIEWS RIGHT  Result Date: 02/04/2021 CLINICAL DATA:  75 year old male with ORIF of the right femur. EXAM: RIGHT  FEMUR 2 VIEWS; DG C-ARM 1-60 MIN COMPARISON:  Right femur radiograph dated 02/03/2021. FINDINGS: Seven intraoperative fluoroscopic spot images provided. The total fluoroscopic time is 54 seconds with a total air kerma of 9.47 mGy. There has been interval placement of fixation sideplate screws along the distal femoral fracture. IMPRESSION: Fluoroscopic spot images of ORIF of the right femur. Electronically Signed   By: Anner Crete M.D.   On: 02/04/2021 21:21    Marzetta Board, MD, PhD [facility]  Between 7 am - 7 pm I am available, please contact me via Amion (for emergencies) or Securechat (non urgent messages)  Between 7 pm - 7 am I am not available, please contact night coverage MD/APP via Amion

## 2021-02-05 NOTE — Evaluation (Signed)
Occupational Therapy Evaluation Patient Details Name: Brandon [name] MRN: [id number] DOB: [birth year] Today's Date: 02/05/2021    History of Present Illness 75 yo male wiht onset of mechanical fall from a trip at home, came down on R knee.  Had ORIF R distal femur and R patella was noted to be undisplaced.  Permitted ROM to R knee and TDWB.  PMHx:  PAF, GI bleed, cirrhosis, chronic anemia, DM, thrombocytopenia   Clinical Impression   Pt presents with decline in function and safety with ADLs and ADL mobility with impaired strength, balance and endurance. PTA pt lived at home with his 68 year old mother and reports that he was Ind with ADLs/selfcare, home mgt and used a RW. His mother is currently at a SNF for rehab. Pt currently requires min guard A with UB ADLs, max - total A with LB ADLs, total A with toileting, max A +2 for bed mobility to sit EOB, assist to balance self seated EOB initially. Pt declined transfers to recliner. Pt would benefit from acute OT services to address impairments to maximize level of function and safety    Follow Up Recommendations  SNF    Equipment Recommendations  Other (comment) (TBD at SNF)    Recommendations for Other Services       Precautions / Restrictions Precautions Precautions: Fall Precaution Comments: on O2 and monitor sats Restrictions Weight Bearing Restrictions: Yes RLE Weight Bearing: Touchdown weight bearing Other Position/Activity Restrictions: painful to bend RLE      Mobility Bed Mobility Overal bed mobility: Needs Assistance Bed Mobility: Supine to Sit;Sit to Supine     Supine to sit: Mod assist;Max assist;+2 for physical assistance;+2 for safety/equipment Sit to supine: Mod assist;Max assist;+2 for physical assistance;+2 for safety/equipment   General bed mobility comments: Pt is weak and struggling with control of trunk and legs, in part over concern that his LLE is going to be painful    Transfers Overall transfer level:  Needs assistance               General transfer comment: declined to try to stand, feels light headed, very painful R foot/LE, fearful of falling and asked to go back to bed    Balance Overall balance assessment: History of Falls;Needs assistance Sitting-balance support: No upper extremity supported;Feet supported Sitting balance-Leahy Scale: Fair Sitting balance - Comments: fair once seated Postural control: Posterior lean;Right lateral lean                                 ADL either performed or assessed with clinical judgement   ADL Overall ADL's : Needs assistance/impaired Eating/Feeding: Set up;Independent   Grooming: Wash/dry hands;Wash/dry face;Min guard;Sitting   Upper Body Bathing: Min guard;Sitting   Lower Body Bathing: Maximal assistance;Sitting/lateral leans   Upper Body Dressing : Min guard;Sitting   Lower Body Dressing: Total assistance     Toilet Transfer Details (indicate cue type and reason): unable Toileting- Clothing Manipulation and Hygiene: Total assistance;Bed level         General ADL Comments: pt fatigues easily, painful R foot/LE, deferred attempting to transfer by lateral scoot or SPT     Vision Baseline Vision/History: Wears glasses Patient Visual Report: No change from baseline       Perception     Praxis      Pertinent Vitals/Pain Pain Assessment: Faces Faces Pain Scale: Hurts whole lot Pain Location: R knee with movement Pain Descriptors /  Indicators: Grimacing;Guarding Pain Intervention(s): Limited activity within patient's tolerance;Monitored during session;Premedicated before session;Repositioned     Hand Dominance Right   Extremity/Trunk Assessment Upper Extremity Assessment Upper Extremity Assessment: Defer to OT evaluation   Lower Extremity Assessment Lower Extremity Assessment: RLE deficits/detail RLE Deficits / Details: weak and painful, intolerant of IR and flexion of knee RLE Coordination:  decreased gross motor   Cervical / Trunk Assessment Cervical / Trunk Assessment: Kyphotic   Communication Communication Communication: No difficulties   Cognition Arousal/Alertness: Awake/alert Behavior During Therapy: WFL for tasks assessed/performed Overall Cognitive Status: Within Functional Limits for tasks assessed                                     General Comments  Pt was unable to stand and unable to scoot on the bed despite attempts    Exercises     Shoulder Instructions      Home Living Family/patient expects to be discharged to:: Private residence Living Arrangements: Parent Available Help at Discharge: Family;Available 24 hours/day Type of Home: Apartment Home Access: Level entry     Home Layout: One level     Bathroom Shower/Tub: Teacher, early years/pre: Handicapped height Bathroom Accessibility: Yes   Home Equipment: Environmental consultant - 2 wheels;Tub bench;Grab bars - tub/shower;Grab bars - toilet;Toilet riser   Additional Comments: Pt. has food and medicine delivered.      Prior Functioning/Environment Level of Independence: Needs assistance  Gait / Transfers Assistance Needed: Reports uses RW but having difficulty and frequent falls; limited community distances ADL's / Homemaking Assistance Needed: Pt does sponge baths but reports independent with ADLs   Comments: sleeps in recliner; does not drive        OT Problem List: Decreased strength;Impaired balance (sitting and/or standing);Pain;Decreased activity tolerance;Decreased coordination;Decreased knowledge of use of DME or AE      OT Treatment/Interventions: Self-care/ADL training;Therapeutic exercise;Patient/family education;Balance training;Therapeutic activities;DME and/or AE instruction    OT Goals(Current goals can be found in the care plan section) Acute Rehab OT Goals Patient Stated Goal: To get back home again OT Goal Formulation: With patient Time For Goal  Achievement: 02/19/21 Potential to Achieve Goals: Good ADL Goals Pt Will Perform Grooming: with supervision;with set-up;sitting Pt Will Perform Upper Body Bathing: with supervision;with set-up;sitting Pt Will Perform Lower Body Bathing: with mod assist;with min assist;sitting/lateral leans Pt Will Perform Upper Body Dressing: with supervision;with set-up;sitting Pt Will Transfer to Toilet: with max assist;with mod assist;with +2 assist;squat pivot transfer;stand pivot transfer;with transfer board  OT Frequency: Min 2X/week   Barriers to D/C:            Co-evaluation PT/OT/SLP Co-Evaluation/Treatment: Yes Reason for Co-Treatment: Complexity of the patient's impairments (multi-system involvement);To address functional/ADL transfers;For patient/therapist safety PT goals addressed during session: Mobility/safety with mobility;Balance OT goals addressed during session: ADL's and self-care      AM-PAC OT "6 Clicks" Daily Activity     Outcome Measure Help from another person eating meals?: None Help from another person taking care of personal grooming?: A Little Help from another person toileting, which includes using toliet, bedpan, or urinal?: Total Help from another person bathing (including washing, rinsing, drying)?: A Lot Help from another person to put on and taking off regular upper body clothing?: A Little Help from another person to put on and taking off regular lower body clothing?: Total 6 Click Score: 14   End of Session Nurse  Communication: Mobility status  Activity Tolerance: Patient limited by fatigue;Patient limited by pain;Other (comment) (light headed) Patient left:    OT Visit Diagnosis: Other abnormalities of gait and mobility (R26.89);History of falling (Z91.81);Muscle weakness (generalized) (M62.81);Pain Pain - Right/Left: Right Pain - part of body: Ankle and joints of foot;Leg                Time: 7564-3329 OT Time Calculation (min): 27 min Charges:  OT  General Charges $OT Visit: 1 Visit OT Evaluation $OT Eval Moderate Complexity: 1 Mod    Britt Bottom 02/05/2021, 3:24 PM

## 2021-02-05 NOTE — NC FL2 (Signed)
[place] LEVEL OF CARE SCREENING TOOL     IDENTIFICATION  Patient Name: Brandon [name] Birthdate: 08/05/1945 Sex: male Admission Date (Current Location): 02/03/2021  [facility] and [place] Number:  Herbalist and Address:  [facility] [place] [address] [place], [place] [address]      Provider Number: [id number]  Attending Physician Name and Address:  Caren Griffins, MD  Relative Name and Phone Number:  Hassan, [place] [facility])   [phone]    Current Level of Care: Hospital Recommended Level of Care: [place] Prior Approval Number:    Date Approved/Denied:   PASRR Number: [id number]  Discharge Plan: SNF    Current Diagnoses: Patient Active Problem List   Diagnosis Date Noted   Disp supracondyl fx w/o intracondylar extension of right distal femur ([place]) 02/05/2021   Hypomagnesemia 02/04/2021   Fall at home, initial encounter 02/04/2021   Cirrhosis of liver (HCC)    Chronic heart failure with preserved ejection fraction (HFpEF) ([place]) 06/07/2020   Hyperkalemia 06/07/2020   Weakness generalized 06/07/2020   Physical deconditioning 06/06/2020   Hypothermia 05/31/2020   Bullous pemphigoid 72/62/0355   Alcoholic cirrhosis of liver with ascites ([place])    Portal hypertensive gastropathy ([place])    Melena 10/02/2019   Angiodysplasia of stomach with hemorrhage    Gastrointestinal hemorrhage    Atrial fibrillation (HCC)    Chronic pain syndrome    Symptomatic anemia 08/13/2019   Acute upper GI bleed 08/13/2019   Anemia 07/2019   Avulsion of right patellar tendon    Closed displaced fracture of phalanx of toe of right foot 10/06/2018   Obesity BMI over 52 09/30/2018   Patella fracture, extensor mechanism disruption R knee  09/29/2018   History of colon cancer 08/23/2018   CAD (coronary artery disease) 08/23/2018   DM (diabetes mellitus), type 2 ([place]) 08/23/2018   Pyuria 08/23/2018    Orientation RESPIRATION  BLADDER Height & Weight     Self, Time, Situation, Place  Other (Comment) (Nasal Cannula) Continent Weight: (!) 347 lb 10.7 oz (157.7 kg) Height:  5\' 6"  (167.6 cm)  BEHAVIORAL SYMPTOMS/MOOD NEUROLOGICAL BOWEL NUTRITION STATUS      Continent Diet (See d/c orders)  AMBULATORY STATUS COMMUNICATION OF NEEDS Skin   Extensive Assist Verbally Surgical wounds                       Personal Care Assistance Level of Assistance  Bathing, Feeding, Dressing Bathing Assistance: Limited assistance Feeding assistance: Independent Dressing Assistance: Limited assistance     Functional Limitations Info  Sight, Speech, Hearing Sight Info: Adequate Hearing Info: Adequate Speech Info: Adequate    SPECIAL CARE FACTORS FREQUENCY                       Contractures      Additional Factors Info  Code Status, Insulin Sliding Scale, Allergies Code Status Info: DNR Allergies Info: NKA   Insulin Sliding Scale Info: see d/c med list       Current Medications (02/05/2021):  This is the current hospital active medication list Current Facility-Administered Medications  Medication Dose Route Frequency Provider Last Rate Last Admin   0.9 %  sodium chloride infusion   Intravenous Continuous Delray Alt, PA-C 75 mL/hr at 02/05/21 0857 New Bag at 02/05/21 0857   acetaminophen (TYLENOL) tablet 325-650 mg  325-650 mg Oral Q6H PRN Delray Alt, PA-C  carvedilol (COREG) tablet 25 mg  25 mg Oral BID WC Delray Alt, PA-C   25 mg at 02/05/21 0848   docusate sodium (COLACE) capsule 100 mg  100 mg Oral BID Delray Alt, PA-C   100 mg at 02/05/21 0956   enoxaparin (LOVENOX) injection 70 mg  70 mg Subcutaneous Q24H Caren Griffins, MD   70 mg at 02/05/21 4742   finasteride (PROSCAR) tablet 5 mg  5 mg Oral Daily Delray Alt, PA-C   5 mg at 02/05/21 5956   HYDROcodone-acetaminophen (NORCO) 7.5-325 MG per tablet 1-2 tablet  1-2 tablet Oral Q4H PRN Delray Alt, PA-C        HYDROcodone-acetaminophen (NORCO/VICODIN) 5-325 MG per tablet 1-2 tablet  1-2 tablet Oral Q4H PRN Delray Alt, PA-C   2 tablet at 02/05/21 1437   insulin aspart (novoLOG) injection 0-6 Units  0-6 Units Subcutaneous Q6H Delray Alt, PA-C   1 Units at 02/05/21 0047   metoCLOPramide (REGLAN) tablet 5-10 mg  5-10 mg Oral Q8H PRN Delray Alt, PA-C       Or   metoCLOPramide (REGLAN) injection 5-10 mg  5-10 mg Intravenous Q8H PRN Delray Alt, PA-C       morphine 2 MG/ML injection 0.5-1 mg  0.5-1 mg Intravenous Q2H PRN Delray Alt, PA-C       naloxone [facility]) injection 0.4 mg  0.4 mg Intravenous PRN Delray Alt, PA-C       ondansetron [facility]) tablet 4 mg  4 mg Oral Q6H PRN Delray Alt, PA-C       Or   ondansetron (ZOFRAN) injection 4 mg  4 mg Intravenous Q6H PRN Patrecia Pace A, PA-C       polyethylene glycol (MIRALAX / GLYCOLAX) packet 17 g  17 g Oral Daily PRN Patrecia Pace A, PA-C       pravastatin (PRAVACHOL) tablet 10 mg  10 mg Oral QHS Patrecia Pace A, PA-C   10 mg at 02/04/21 2229   tamsulosin (FLOMAX) capsule 0.4 mg  0.4 mg Oral QHS Patrecia Pace A, PA-C   0.4 mg at 02/04/21 2230     Discharge Medications: Please see discharge summary for a list of discharge medications.  Relevant Imaging Results:  Relevant Lab Results:   Additional Information [id number] LCSWA

## 2021-02-06 ENCOUNTER — Other Ambulatory Visit (HOSPITAL_COMMUNITY): Payer: Medicare Other

## 2021-02-06 ENCOUNTER — Inpatient Hospital Stay (HOSPITAL_COMMUNITY): Payer: Medicare Other

## 2021-02-06 LAB — CBC
HCT: 31 % — ABNORMAL LOW (ref 39.0–52.0)
Hemoglobin: 9 g/dL — ABNORMAL LOW (ref 13.0–17.0)
MCH: 28.3 pg (ref 26.0–34.0)
MCHC: 29 g/dL — ABNORMAL LOW (ref 30.0–36.0)
MCV: 97.5 fL (ref 80.0–100.0)
Platelets: 88 10*3/uL — ABNORMAL LOW (ref 150–400)
RBC: 3.18 MIL/uL — ABNORMAL LOW (ref 4.22–5.81)
RDW: 14.2 % (ref 11.5–15.5)
WBC: 6.7 10*3/uL (ref 4.0–10.5)
nRBC: 0 % (ref 0.0–0.2)

## 2021-02-06 LAB — PROTIME-INR
INR: 1.3 — ABNORMAL HIGH (ref 0.8–1.2)
Prothrombin Time: 15.9 seconds — ABNORMAL HIGH (ref 11.4–15.2)

## 2021-02-06 LAB — COMPREHENSIVE METABOLIC PANEL
ALT: 7 U/L (ref 0–44)
AST: 18 U/L (ref 15–41)
Albumin: 2.6 g/dL — ABNORMAL LOW (ref 3.5–5.0)
Alkaline Phosphatase: 74 U/L (ref 38–126)
Anion gap: 8 (ref 5–15)
BUN: 38 mg/dL — ABNORMAL HIGH (ref 8–23)
CO2: 26 mmol/L (ref 22–32)
Calcium: 8.5 mg/dL — ABNORMAL LOW (ref 8.9–10.3)
Chloride: 102 mmol/L (ref 98–111)
Creatinine, Ser: 1.74 mg/dL — ABNORMAL HIGH (ref 0.61–1.24)
GFR, Estimated: 41 mL/min — ABNORMAL LOW (ref >60)
Glucose, Bld: 132 mg/dL — ABNORMAL HIGH (ref 70–99)
Potassium: 4.7 mmol/L (ref 3.5–5.1)
Sodium: 136 mmol/L (ref 135–145)
Total Bilirubin: 0.7 mg/dL (ref 0.3–1.2)
Total Protein: 5.3 g/dL — ABNORMAL LOW (ref 6.5–8.1)

## 2021-02-06 LAB — PHOSPHORUS: Phosphorus: 3.5 mg/dL (ref 2.5–4.6)

## 2021-02-06 LAB — GLUCOSE, CAPILLARY
Glucose-Capillary: 122 mg/dL — ABNORMAL HIGH (ref 70–99)
Glucose-Capillary: 140 mg/dL — ABNORMAL HIGH (ref 70–99)
Glucose-Capillary: 140 mg/dL — ABNORMAL HIGH (ref 70–99)
Glucose-Capillary: 162 mg/dL — ABNORMAL HIGH (ref 70–99)

## 2021-02-06 LAB — MAGNESIUM: Magnesium: 1.7 mg/dL (ref 1.7–2.4)

## 2021-02-06 IMAGING — US US RENAL
1 series · 14 of 25 positions shown · non-contrast
Comparison: CT of the abdomen and pelvis [DATE]. MRI of the
abdomen without and with contrast [DATE]

CLINICAL DATA: Acute kidney insufficiency.

EXAM:
RENAL / URINARY TRACT ULTRASOUND COMPLETE

[Series 1: us renal · 14 of 43 slices shown]
[im 1/43]
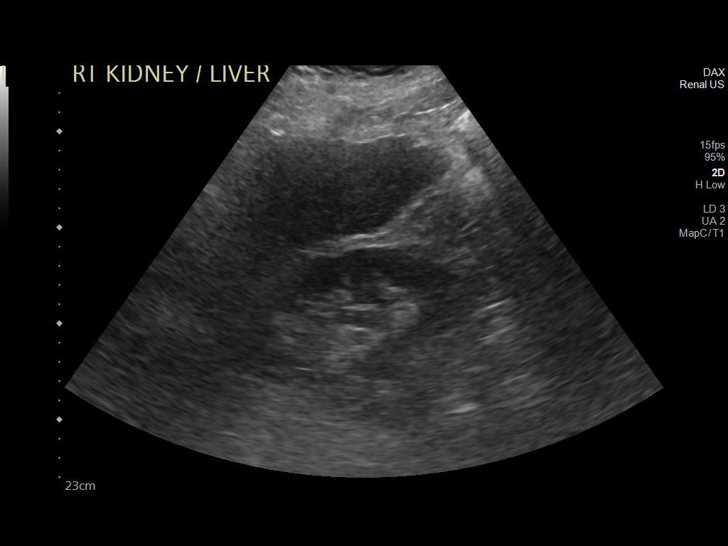
[im 4/43]
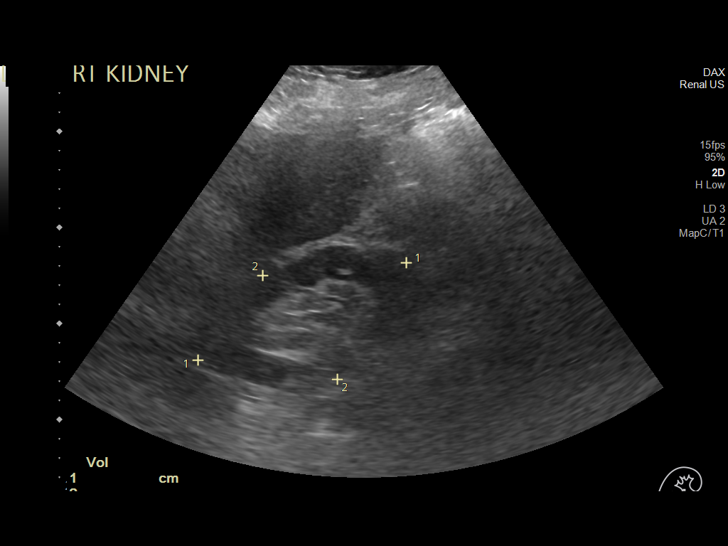
[im 8/43]
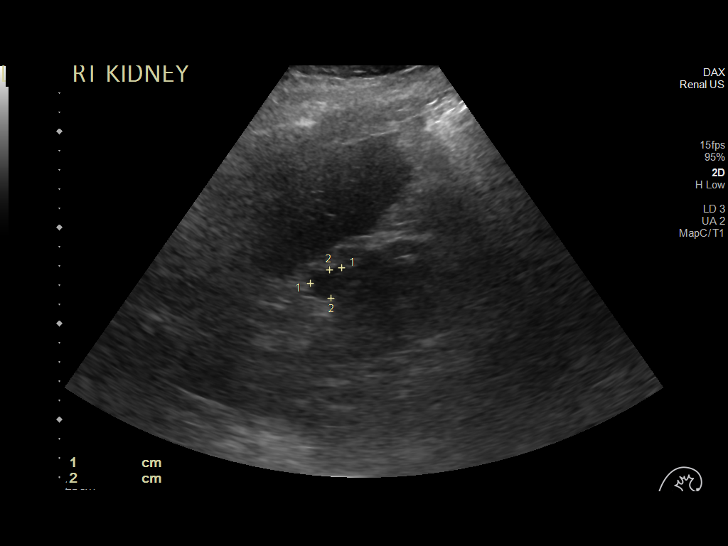
[im 11/43]
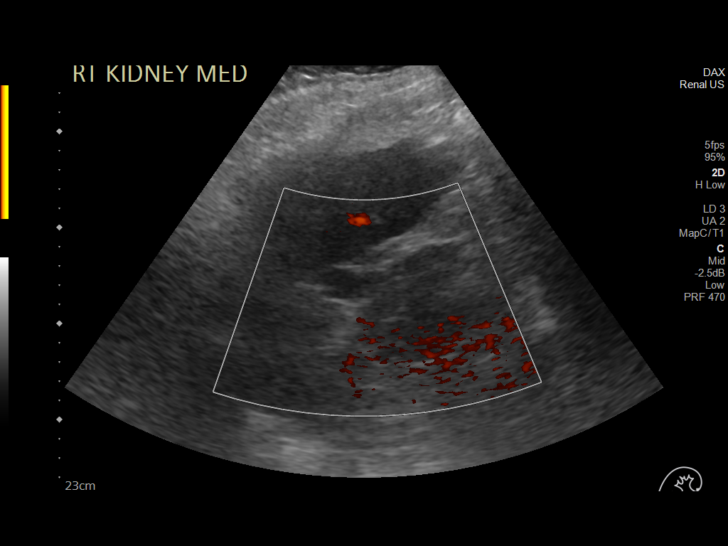
[im 15/43]
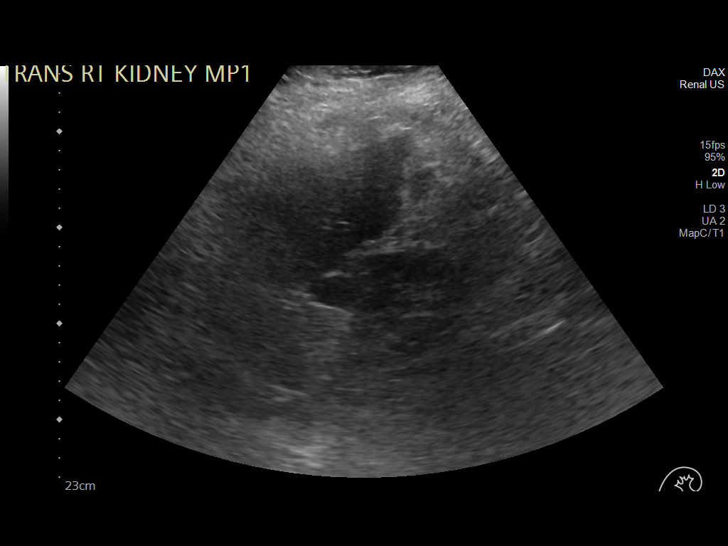
[im 16/43]
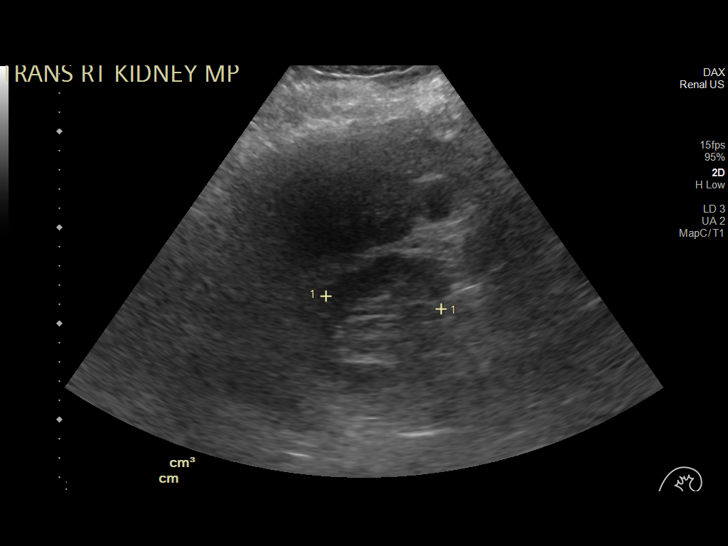
[im 20/43]
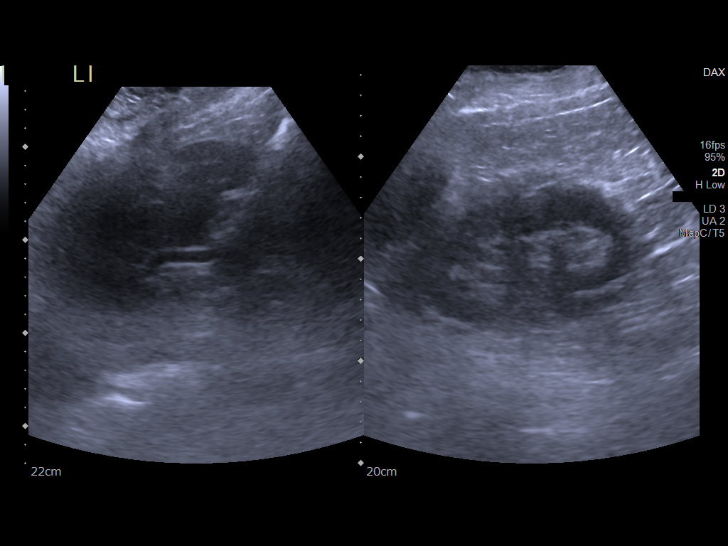
[im 23/43]
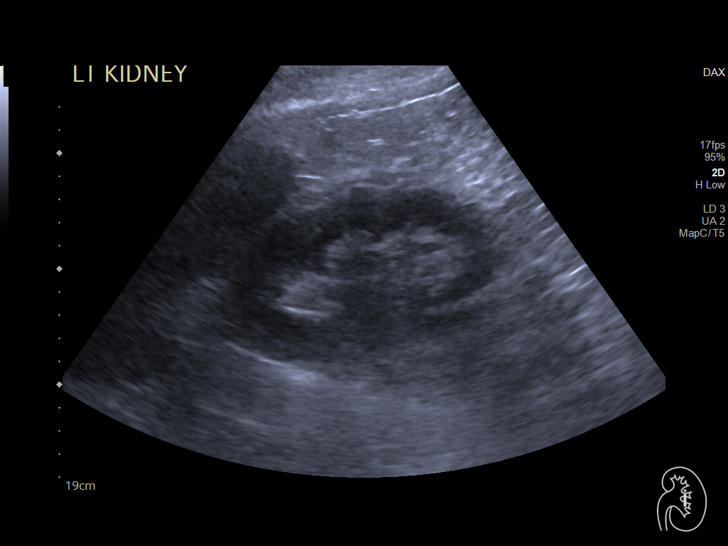
[im 27/43]
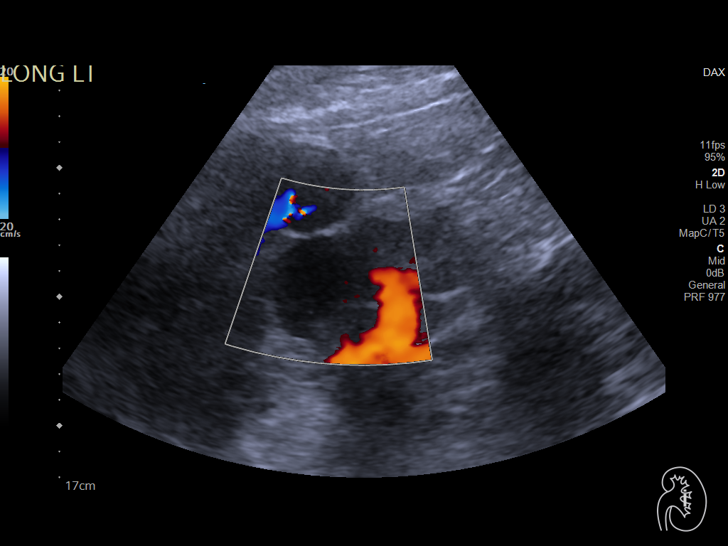
[im 29/43]
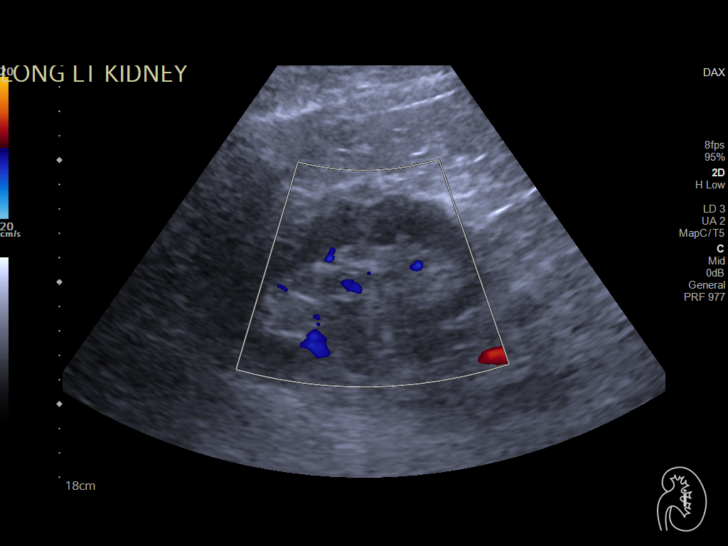
[im 32/43]
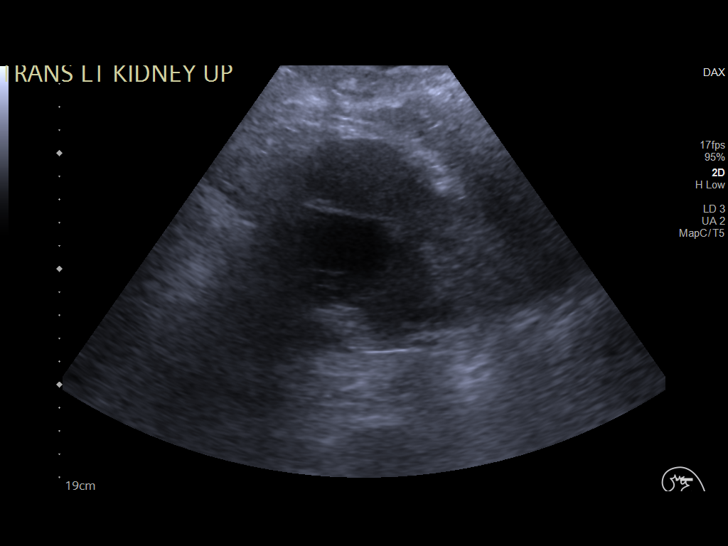
[im 36/43]
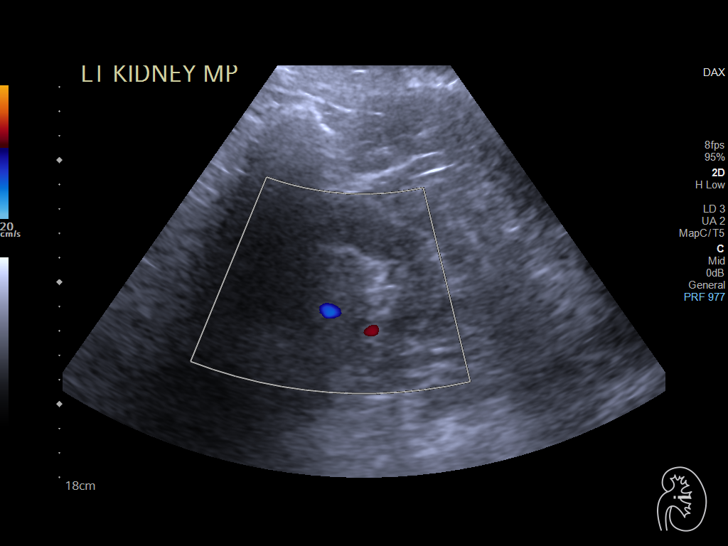
[im 39/43]
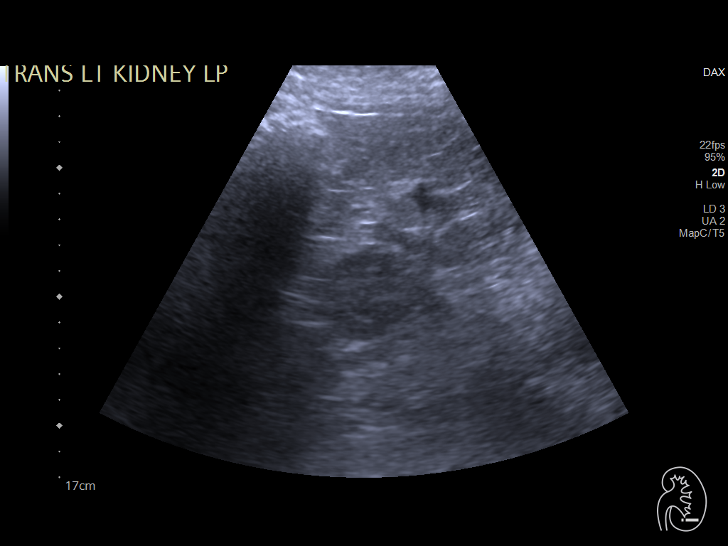
[im 43/43]
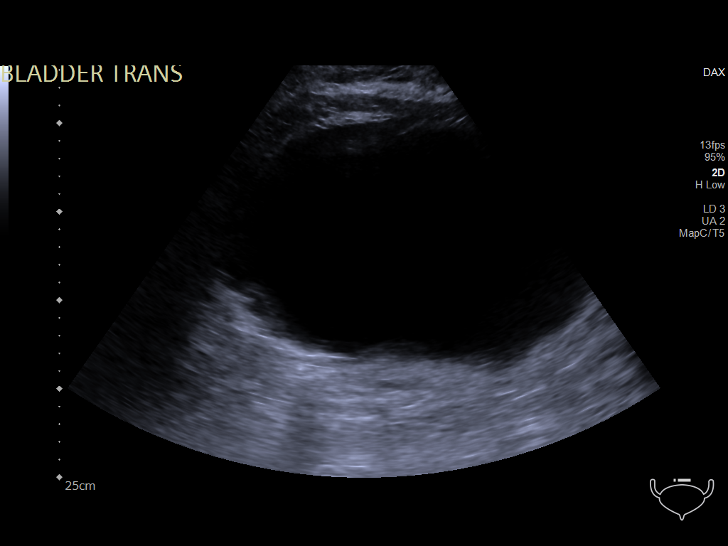

[14 of 25 positions shown; findings below may reference images not displayed]

FINDINGS: Right Kidney:

Renal measurements: 12.0 x 6.7 x 6.0 cm = volume: 251 mL. Lower pole
of the right kidney is poorly visualized due to bowel gas and
patient body habitus. Simple exophytic cyst measuring up to 2.3 mm
is stable. No other focal lesions are present. No obstruction is
present.

Left Kidney:

Renal measurements: 0.5 x 6.7 x 6.7 cm = volume: 267 mL. Simple cyst
at the upper pole of the right kidney that simple cyst at the upper
pole again demonstrated, measuring 4.1 x 3.9 x 3.5 cm no new lesions
are present. Renal parenchyma otherwise normal.

Bladder:

Bladder wall thickness and irregularity again noted. No focal
lesions present.

Other:

None.
IMPRESSION: 1. No acute abnormality.
2. Stable bilateral simple renal cysts.
3. No obstruction.

## 2021-02-06 MED ORDER — ENOXAPARIN SODIUM 80 MG/0.8ML IJ SOSY: 70.0000 mg | PREFILLED_SYRINGE | INTRAMUSCULAR | 0 refills | Status: DC

## 2021-02-06 MED ORDER — SODIUM CHLORIDE 0.9 % IV SOLN: INTRAVENOUS | Status: DC

## 2021-02-06 MED ORDER — ONDANSETRON HCL 4 MG PO TABS: 4.0000 mg | ORAL_TABLET | Freq: Four times a day (QID) | ORAL | 0 refills | Status: DC | PRN

## 2021-02-06 MED ORDER — DOCUSATE SODIUM 100 MG PO CAPS: 100.0000 mg | ORAL_CAPSULE | Freq: Two times a day (BID) | ORAL | 0 refills | Status: DC

## 2021-02-06 MED ORDER — HYDROCODONE-ACETAMINOPHEN 5-325 MG PO TABS: 1.0000 | ORAL_TABLET | ORAL | 0 refills | Status: DC | PRN

## 2021-02-06 NOTE — Progress Notes (Signed)
Orthopaedic Trauma Progress Note  SUBJECTIVE: Doing well. Pain manageable with current regimen. No other complaints.   OBJECTIVE:  Vitals:   02/05/21 1557 02/05/21 2143  BP: (!) 107/52 (!) 116/55  Pulse: 79 93  Resp: 18 16  Temp: 98.1 F (36.7 C) 98.6 F (37 C)  SpO2: 96% 98%    General: Sitting up in bed, NAD Respiratory: No increased work of breathing.  RLE: Dressing removed, incisions CDI with dermabond in place. No significant tenderness throughout leg. Ankle DF/PF intact. Compartments soft and compressible. Foot warm and well perfused  IMAGING: Stable post op imaging.   LABS:  Results for orders placed or performed during the hospital encounter of 02/03/21 (from the past 24 hour(s))  Glucose, capillary     Status: Abnormal   Collection Time: 02/05/21 11:53 AM  Result Value Ref Range   Glucose-Capillary 139 (H) 70 - 99 mg/dL  Glucose, capillary     Status: Abnormal   Collection Time: 02/05/21  5:56 PM  Result Value Ref Range   Glucose-Capillary 149 (H) 70 - 99 mg/dL  Glucose, capillary     Status: Abnormal   Collection Time: 02/05/21  9:02 PM  Result Value Ref Range   Glucose-Capillary 162 (H) 70 - 99 mg/dL  Glucose, capillary     Status: Abnormal   Collection Time: 02/06/21 12:17 AM  Result Value Ref Range   Glucose-Capillary 140 (H) 70 - 99 mg/dL  CBC     Status: Abnormal   Collection Time: 02/06/21  4:25 AM  Result Value Ref Range   WBC 6.7 4.0 - 10.5 K/uL   RBC 3.18 (L) 4.22 - 5.81 MIL/uL   Hemoglobin 9.0 (L) 13.0 - 17.0 g/dL   HCT 31.0 (L) 39.0 - 52.0 %   MCV 97.5 80.0 - 100.0 fL   MCH 28.3 26.0 - 34.0 pg   MCHC 29.0 (L) 30.0 - 36.0 g/dL   RDW 14.2 11.5 - 15.5 %   Platelets 88 (L) 150 - 400 K/uL   nRBC 0.0 0.0 - 0.2 %  Comprehensive metabolic panel     Status: Abnormal   Collection Time: 02/06/21  4:25 AM  Result Value Ref Range   Sodium 136 135 - 145 mmol/L   Potassium 4.7 3.5 - 5.1 mmol/L   Chloride 102 98 - 111 mmol/L   CO2 26 22 - 32 mmol/L    Glucose, Bld 132 (H) 70 - 99 mg/dL   BUN 38 (H) 8 - 23 mg/dL   Creatinine, Ser 1.74 (H) 0.61 - 1.24 mg/dL   Calcium 8.5 (L) 8.9 - 10.3 mg/dL   Total Protein 5.3 (L) 6.5 - 8.1 g/dL   Albumin 2.6 (L) 3.5 - 5.0 g/dL   AST 18 15 - 41 U/L   ALT 7 0 - 44 U/L   Alkaline Phosphatase 74 38 - 126 U/L   Total Bilirubin 0.7 0.3 - 1.2 mg/dL   GFR, Estimated 41 (L) >60 mL/min   Anion gap 8 5 - 15  Magnesium     Status: None   Collection Time: 02/06/21  4:25 AM  Result Value Ref Range   Magnesium 1.7 1.7 - 2.4 mg/dL  Phosphorus     Status: None   Collection Time: 02/06/21  4:25 AM  Result Value Ref Range   Phosphorus 3.5 2.5 - 4.6 mg/dL  Protime-INR     Status: Abnormal   Collection Time: 02/06/21  4:25 AM  Result Value Ref Range   Prothrombin Time 15.9 (H)  11.4 - 15.2 seconds   INR 1.3 (H) 0.8 - 1.2  Glucose, capillary     Status: Abnormal   Collection Time: 02/06/21  6:37 AM  Result Value Ref Range   Glucose-Capillary 122 (H) 70 - 99 mg/dL    ASSESSMENT: Brandon [name] is a 75 y.o. male, 2 Days Post-Op  s/p ORIF RIGHT DISTAL FEMUR FRACTURE  CV/Blood loss: Acute blood loss anemia, Hgb 9.0 this morning. Hemodynamically stable  PLAN: Weightbearing: TDWB RLE Incisional and dressing care:  Ok to leave open to air Showering: Ok to begin showering with assistance 02/07/21 Orthopedic device(s): None  Pain management:  1. Tylenol 325-650 mg q 6 hours PRN 2. Robaxin 500 mg q 6 hours PRN 3. Norco 5-325 OR 7.5-325 mg q 4 hours PRN 4. Morphine 0.5-1 mg q 2 hours PRN VTE prophylaxis: Lovenox, SCDs ID:  Ancef 2gm post op completed Foley/Lines:  No foley, KVO IVFs Impediments to Fracture Healing: Vit D level 38, no supplementation needed Dispo: Therapies as tolerated, PT/OT recommending SNF. Okay for discharge from ortho standpoint once cleared by medicine team and therapies. Have signed and placed rx for pain meds and Lovenox in chart  Follow - up plan: 2 weeks  Contact information:  Katha Hamming MD, Patrecia Pace PA-C. After hours and holidays please check [email or web address] for group call information for [facility]   Angila Wombles A. Ricci Barker, PA-C [phone] (office) [email or web address]

## 2021-02-06 NOTE — TOC Progression Note (Signed)
Transition of Care [facility]) - Progression Note    Patient Details  Name: Brandon [name] MRN: [id number] Date of Birth: 1946/07/13  [facility] CM/SW Contact  Milinda Antis, [place] Phone Number: 02/06/2021, 3:51 PM  Clinical Narrative:    CSW met with the patient at bedside to present bed offers.  The patient reported that he would like to go to the facility where his mother is currently but could not remember the name of the facility.  CSW was informed that the RN for the patient's mother, Brandon [name], would call CSW with this information.    15:54-  CSW spoke with the patient's son, Brandon [name], and was informed that the patient's mother is placed at [facility].  CSW faxed referral to [facility].     Expected Discharge Plan: Skilled Nursing Facility Barriers to Discharge: Insurance Authorization, SNF Pending bed offer, Continued Medical Work up  Expected Discharge Plan and Services Expected Discharge Plan: [place]                                               Social Determinants of Health (SDOH) Interventions    Readmission Risk Interventions Readmission Risk Prevention Plan 10/06/2019 10/06/2019  Transportation Screening - Complete  PCP or Specialist Appt within 3-5 Days Complete -  HRI or [place] - Complete  Social Work Consult for [place] Planning/Counseling - Complete  Palliative Care Screening - Not Applicable  Medication Review Press photographer) - Complete

## 2021-02-06 NOTE — Discharge Instructions (Signed)
[facility]th[facility]uma Service Discharge Instructions   General Discharge Instructions  WEIGHT BEARING STATUS: Touchdown weightbearing right leg  RANGE OF MOTION/ACTIVITY: Ok for unrestricted knee motion  Wound Care: Incisions can be left open to air if there is no drainage. If incision continues to have drainage, follow wound care instructions below. Okay to shower if no drainage from incisions.  DVT/PE prophylaxis: Lovenox  Diet: as you were eating previously.  Can use over the counter stool softeners and bowel preparations, such as Miralax, to help with bowel movements.  Narcotics can be constipating.  Be sure to drink plenty of fluids  PAIN MEDICATION USE AND EXPECTATIONS  You have likely been given narcotic medications to help control your pain.  After a traumatic event that results in an fracture (broken bone) with or without surgery, it is ok to use narcotic pain medications to help control one's pain.  We understand that everyone responds to pain differently and each individual patient will be evaluated on a regular basis for the continued need for narcotic medications. Ideally, narcotic medication use should last no more than 6-8 weeks (coinciding with fracture healing).   As a patient it is your responsibility as well to monitor narcotic medication use and report the amount and frequency you use these medications when you come to your office visit.   We would also advise that if you are using narcotic medications, you should take a dose prior to therapy to maximize you participation.  IF YOU ARE ON NARCOTIC MEDICATIONS IT IS NOT PERMISSIBLE TO OPERATE A MOTOR VEHICLE (MOTORCYCLE/CAR/TRUCK/MOPED) OR HEAVY MACHINERY DO NOT MIX NARCOTICS WITH OTHER CNS (CENTRAL NERVOUS SYSTEM) DEPRESSANTS SUCH AS ALCOHOL   STOP SMOKING OR USING NICOTINE PRODUCTS!!!!  As discussed nicotine severely impairs your body's ability to heal surgical and traumatic wounds but also impairs bone healing.  Wounds  and bone heal by forming microscopic blood vessels (angiogenesis) and nicotine is a vasoconstrictor (essentially, shrinks blood vessels).  Therefore, if vasoconstriction occurs to these microscopic blood vessels they essentially disappear and are unable to deliver necessary nutrients to the healing tissue.  This is one modifiable factor that you can do to dramatically increase your chances of healing your injury.    (This means no smoking, no nicotine gum, patches, etc)  DO NOT USE NONSTEROIDAL ANTI-INFLAMMATORY DRUGS (NSAID'S)  Using products such as Advil (ibuprofen), Aleve (naproxen), Motrin (ibuprofen) for additional pain control during fracture healing can delay and/or prevent the healing response.  If you would like to take over the counter (OTC) medication, Tylenol (acetaminophen) is ok.  However, some narcotic medications that are given for pain control contain acetaminophen as well. Therefore, you should not exceed more than 4000 mg of tylenol in a day if you do not have liver disease.  Also note that there are may OTC medicines, such as cold medicines and allergy medicines that my contain tylenol as well.  If you have any questions about medications and/or interactions please ask your doctor/PA or your pharmacist.      ICE AND ELEVATE INJURED/OPERATIVE EXTREMITY  Using ice and elevating the injured extremity above your heart can help with swelling and pain control.  Icing in a pulsatile fashion, such as 20 minutes on and 20 minutes off, can be followed.    Do not place ice directly on skin. Make sure there is a barrier between to skin and the ice pack.    Using frozen items such as frozen peas works well as the conform nicely  to the are that needs to be iced.  USE AN ACE WRAP OR TED HOSE FOR SWELLING CONTROL  In addition to icing and elevation, Ace wraps or TED hose are used to help limit and resolve swelling.  It is recommended to use Ace wraps or TED hose until you are informed to stop.     When using Ace Wraps start the wrapping distally (farthest away from the body) and wrap proximally (closer to the body)   Example: If you had surgery on your leg or thing and you do not have a splint on, start the ace wrap at the toes and work your way up to the thigh        If you had surgery on your upper extremity and do not have a splint on, start the ace wrap at your fingers and work your way up to the upper arm   [place]: [phone]   VISIT OUR WEBSITE FOR ADDITIONAL INFORMATION: [email or web address]    Discharge Wound Care Instructions  Do NOT apply any ointments, solutions or lotions to pin sites or surgical wounds.  These prevent needed drainage and even though solutions like hydrogen peroxide kill bacteria, they also damage cells lining the pin sites that help fight infection.  Applying lotions or ointments can keep the wounds moist and can cause them to breakdown and open up as well. This can increase the risk for infection. When in doubt call the office.   If any drainage is noted, use one layer of adaptic, then gauze, Kerlix, and an ace wrap.  Once the incision is completely dry and without drainage, it may be left open to air out.  Showering may begin 36-48 hours later.  Cleaning gently with soap and water.

## 2021-02-06 NOTE — Progress Notes (Signed)
Physical Therapy Treatment Patient Details Name: Brandon [name] MRN: [id number] DOB: 1945/08/16 Today's Date: 02/06/2021    History of Present Illness 75 yo male with onset of mechanical fall from a trip at home, came down on R knee.  Had ORIF R distal femur and R patella was noted to be undisplaced.  Permitted ROM to R knee and TDWB.  PMHx:  PAF, GI bleed, cirrhosis, chronic anemia, DM, thrombocytopenia    PT Comments    Pt was tired out and had medical issues this AM that included struggle to breathe per pt.  Agreed to do exercises and get repositioned on Bed, but declines to get up.  Pt is flexing R knee better with active assistance, and should be able to get up in chair next visit.  Recommend SNF still to recover strength, standing control and to return home as planned.  Follow acute PT goals as tolerated.     Follow Up Recommendations  SNF     Equipment Recommendations  None recommended by PT    Recommendations for Other Services       Precautions / Restrictions Precautions Precautions: Fall Precaution Comments: monitor vitals Restrictions Weight Bearing Restrictions: Yes RLE Weight Bearing: Touchdown weight bearing    Mobility  Bed Mobility Overal bed mobility: Needs Assistance             General bed mobility comments: declined OOB but repositioned    Transfers Overall transfer level: Needs assistance               General transfer comment: declined  Ambulation/Gait                 Stairs             Wheelchair Mobility    Modified Rankin (Stroke Patients Only)       Balance Overall balance assessment: History of Falls;Needs assistance Sitting-balance support: Feet supported                                        Cognition Arousal/Alertness: Awake/alert Behavior During Therapy: WFL for tasks assessed/performed Overall Cognitive Status: Within Functional Limits for tasks assessed                                         Exercises General Exercises - Lower Extremity Ankle Circles/Pumps: AAROM;5 reps Quad Sets: AROM;10 reps Gluteal Sets: AAROM;10 reps Heel Slides: AAROM;10 reps Hip ABduction/ADduction: AAROM;10 reps Straight Leg Raises: AAROM;10 reps    General Comments General comments (skin integrity, edema, etc.): pt declined transfers, had a breathing difficulty earlier in the day per pt and is too tired for getting OOB      Pertinent Vitals/Pain Pain Assessment: Faces Faces Pain Scale: Hurts even more Pain Location: R knee with movement Pain Descriptors / Indicators: Grimacing;Guarding Pain Intervention(s): Limited activity within patient's tolerance;Monitored during session;Premedicated before session;Repositioned    Home Living                      Prior Function            PT Goals (current goals can now be found in the care plan section) Acute Rehab PT Goals Patient Stated Goal: To get back home again Progress towards PT goals: Not progressing toward goals - comment  Frequency    Min 3X/week      PT Plan Current plan remains appropriate    Co-evaluation              AM-PAC PT "6 Clicks" Mobility   Outcome Measure  Help needed turning from your back to your side while in a flat bed without using bedrails?: A Lot Help needed moving from lying on your back to sitting on the side of a flat bed without using bedrails?: A Lot Help needed moving to and from a bed to a chair (including a wheelchair)?: A Lot Help needed standing up from a chair using your arms (e.g., wheelchair or bedside chair)?: Total Help needed to walk in hospital room?: Total Help needed climbing 3-5 steps with a railing? : Total 6 Click Score: 9    End of Session   Activity Tolerance: Patient limited by fatigue;Patient limited by pain Patient left: in bed;with call bell/phone within reach;with bed alarm set Nurse Communication: Mobility status;Other  (comment) PT Visit Diagnosis: Muscle weakness (generalized) (M62.81);History of falling (Z91.81);Pain;Difficulty in walking, not elsewhere classified (R26.2) Pain - Right/Left: Right Pain - part of body: Leg;Knee;Hip     Time: 1405-1440 PT Time Calculation (min) (ACUTE ONLY): 35 min  Charges:  $Therapeutic Exercise: 8-22 mins $Therapeutic Activity: 8-22 mins               [name] 02/06/2021, 8:20 PM  Mee Hives, PT MS Acute Rehab Dept. Number: [place] [facility] [place]

## 2021-02-06 NOTE — Progress Notes (Signed)
Patient left floor with transport awake,alert/orientedx4 to ultrasound.

## 2021-02-06 NOTE — Plan of Care (Signed)

## 2021-02-06 NOTE — Progress Notes (Signed)
PROGRESS NOTE    Brandon [name]  CHE:[id number] DOB: 11/13/1945 DOA: 02/03/2021 PCP: Helane Rima, MD    Brief Narrative:  75 year old male with history of PAF not on anticoagulation due to recurrent GI bleed, liver cirrhosis due to remote EtOH not actively drinking, chronic anemia, DM2, obesity class III who comes to the hospital with complaints of a fall followed by right leg pain.  He was found to have an acute distal right femoral fracture, orthopedic surgery was consulted and he was admitted to the hospital.  There was no syncope/loss of consciousness it appears that this was a mechanical fall  7/13 renal function worsening.  Creatinine 1.74 today  Consultants:  Orthopedics  Procedures:   Antimicrobials:      Subjective: Has no complaints this AM.  Lying in bed.  No shortness of breath or chest pain.  Objective: Vitals:   02/05/21 0741 02/05/21 1557 02/05/21 2143 02/06/21 0500  BP: 111/61 (!) 107/52 (!) 116/55   Pulse: 87 79 93   Resp: 18 18 16    Temp: 98.4 F (36.9 C) 98.1 F (36.7 C) 98.6 F (37 C)   TempSrc: Oral Oral Oral   SpO2: 98% 96% 98%   Weight:    (!) 157.1 kg  Height:        Intake/Output Summary (Last 24 hours) at 02/06/2021 0834 Last data filed at 02/05/2021 1700 Gross per 24 hour  Intake 720 ml  Output 300 ml  Net 420 ml   Filed Weights   02/04/21 0922 02/05/21 0500 02/06/21 0500  Weight: (!) 138.8 kg (!) 157.7 kg (!) 157.1 kg    Examination:  General exam: Appears calm and comfortable  Respiratory system: Clear to auscultation. Respiratory effort normal. Cardiovascular system: S1 & S2 heard, RRR.  No gallops  Gastrointestinal system: Abdomen is nondistended, soft and nontender. Normal bowel sounds heard. Central nervous system: Alert and oriented.  Grossly intact Extremities: Mild edema Skin: Warm dry Psychiatry: Mood & affect appropriate.     Data Reviewed: I have personally reviewed following labs and imaging  studies  CBC: Recent Labs  Lab 02/03/21 2355 02/04/21 0351 02/04/21 1829 02/05/21 0310 02/06/21 0425  WBC 9.2 11.9* 11.0* 8.8 6.7  NEUTROABS 7.3  --   --   --   --   HGB 11.5* 13.3 11.3* 9.8* 9.0*  HCT 38.9* 43.4 36.9* 32.2* 31.0*  MCV 96.8 96.0 95.8 96.7 97.5  PLT 127* 139* 139* 108* 88*   Basic Metabolic Panel: Recent Labs  Lab 02/03/21 2355 02/04/21 0351 02/04/21 1829 02/05/21 0310 02/06/21 0425  NA 140 138  --  139 136  K 5.0 4.6  --  5.0 4.7  CL 105 100  --  103 102  CO2 29 29  --  33* 26  GLUCOSE 143* 172*  --  138* 132*  BUN 18 18  --  27* 38*  CREATININE 0.98 1.09 1.20 1.66* 1.74*  CALCIUM 8.6* 9.6  --  8.8* 8.5*  MG 1.5* 2.0  --   --  1.7  PHOS  --   --   --   --  3.5   GFR: Estimated Creatinine Clearance: 53.3 mL/min (A) (by C-G formula based on SCr of 1.74 mg/dL (H)). Liver Function Tests: Recent Labs  Lab 02/03/21 2355 02/04/21 0351 02/06/21 0425  AST 28 21 18   ALT 16 19 7   ALKPHOS 77 95 74  BILITOT 1.0 1.2 0.7  PROT 5.2* 6.5 5.3*  ALBUMIN 2.5* 3.0* 2.6*  No results for input(s): LIPASE, AMYLASE in the last 168 hours. No results for input(s): AMMONIA in the last 168 hours. Coagulation Profile: Recent Labs  Lab 02/03/21 2355 02/04/21 0351 02/06/21 0425  INR 1.1 1.1 1.3*   Cardiac Enzymes: No results for input(s): CKTOTAL, CKMB, CKMBINDEX, TROPONINI in the last 168 hours. BNP (last 3 results) No results for input(s): PROBNP in the last 8760 hours. HbA1C: No results for input(s): HGBA1C in the last 72 hours. CBG: Recent Labs  Lab 02/05/21 1153 02/05/21 1756 02/05/21 2102 02/06/21 0017 02/06/21 0637  GLUCAP 139* 149* 162* 140* 122*   Lipid Profile: No results for input(s): CHOL, HDL, LDLCALC, TRIG, CHOLHDL, LDLDIRECT in the last 72 hours. Thyroid Function Tests: No results for input(s): TSH, T4TOTAL, FREET4, T3FREE, THYROIDAB in the last 72 hours. Anemia Panel: No results for input(s): VITAMINB12, FOLATE, FERRITIN, TIBC, IRON,  RETICCTPCT in the last 72 hours. Sepsis Labs: No results for input(s): PROCALCITON, LATICACIDVEN in the last 168 hours.  Recent Results (from the past 240 hour(s))  Resp Panel by RT-PCR (Flu A&B, Covid) Nasopharyngeal Swab     Status: None   Collection Time: 02/03/21 11:45 PM   Specimen: Nasopharyngeal Swab; Nasopharyngeal(NP) swabs in vial transport medium  Result Value Ref Range Status   SARS Coronavirus 2 by RT PCR NEGATIVE NEGATIVE Final    Comment: (NOTE) SARS-CoV-2 target nucleic acids are NOT DETECTED.  The SARS-CoV-2 RNA is generally detectable in upper respiratory specimens during the acute phase of infection. The lowest concentration of SARS-CoV-2 viral copies this assay can detect is 138 copies/mL. A negative result does not preclude SARS-Cov-2 infection and should not be used as the sole basis for treatment or other patient management decisions. A negative result may occur with  improper specimen collection/handling, submission of specimen other than nasopharyngeal swab, presence of viral mutation(s) within the areas targeted by this assay, and inadequate number of viral copies(<138 copies/mL). A negative result must be combined with clinical observations, patient history, and epidemiological information. The expected result is Negative.  Fact Sheet for Patients:  EntrepreneurPulse.com.au  Fact Sheet for Healthcare Providers:  IncredibleEmployment.be  This test is no t yet approved or cleared by the [place] FDA and  has been authorized for detection and/or diagnosis of SARS-CoV-2 by FDA under an Emergency Use Authorization (EUA). This EUA will remain  in effect (meaning this test can be used) for the duration of the COVID-19 declaration under Section 564(b)(1) of the Act, 21 U.S.C.section 360bbb-3(b)(1), unless the authorization is terminated  or revoked sooner.       Influenza A by PCR NEGATIVE NEGATIVE Final   Influenza  B by PCR NEGATIVE NEGATIVE Final    Comment: (NOTE) The Xpert Xpress SARS-CoV-2/FLU/RSV plus assay is intended as an aid in the diagnosis of influenza from Nasopharyngeal swab specimens and should not be used as a sole basis for treatment. Nasal washings and aspirates are unacceptable for Xpert Xpress SARS-CoV-2/FLU/RSV testing.  Fact Sheet for Patients: EntrepreneurPulse.com.au  Fact Sheet for Healthcare Providers: IncredibleEmployment.be  This test is not yet approved or cleared by the [place] FDA and has been authorized for detection and/or diagnosis of SARS-CoV-2 by FDA under an Emergency Use Authorization (EUA). This EUA will remain in effect (meaning this test can be used) for the duration of the COVID-19 declaration under Section 564(b)(1) of the Act, 21 U.S.C. section 360bbb-3(b)(1), unless the authorization is terminated or revoked.  Performed at [facility], [place] [address] [place], [place]  [address]          Radiology Studies: DG Knee Right Port  Result Date: 02/04/2021 CLINICAL DATA:  Distal right femur ORIF EXAM: PORTABLE RIGHT KNEE - 1-2 VIEW COMPARISON:  02/03/2021 FINDINGS: Interval ORIF of a distal femoral metaphyseal fracture with lateral sideplate and screw fixation construct. Femoral fracture alignment is near anatomic. Mildly displaced patellar fracture appears unchanged in alignment. 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 Expected postoperative changes within the overlying soft tissues. Extensive vascular calcification. IMPRESSION: Interval ORIF of a distal femoral metaphyseal fracture, in near anatomic alignment. Electronically Signed   By: Davina Poke D.O.   On: 02/04/2021 21:23   DG C-Arm 1-60 Min  Result Date: 02/04/2021 CLINICAL DATA:  75 year old male with ORIF of the right femur. EXAM: RIGHT FEMUR 2 VIEWS; DG C-ARM 1-60 MIN COMPARISON:  Right femur radiograph dated 02/03/2021.  FINDINGS: Seven intraoperative fluoroscopic spot images provided. The total fluoroscopic time is 54 seconds with a total air kerma of 9.47 mGy. There has been interval placement of fixation sideplate screws along the distal femoral fracture. IMPRESSION: Fluoroscopic spot images of ORIF of the right femur. Electronically Signed   By: Anner Crete M.D.   On: 02/04/2021 21:21   DG FEMUR, MIN 2 VIEWS RIGHT  Result Date: 02/04/2021 CLINICAL DATA:  75 year old male with ORIF of the right femur. EXAM: RIGHT FEMUR 2 VIEWS; DG C-ARM 1-60 MIN COMPARISON:  Right femur radiograph dated 02/03/2021. FINDINGS: Seven intraoperative fluoroscopic spot images provided. The total fluoroscopic time is 54 seconds with a total air kerma of 9.47 mGy. There has been interval placement of fixation sideplate screws along the distal femoral fracture. IMPRESSION: Fluoroscopic spot images of ORIF of the right femur. Electronically Signed   By: Anner Crete M.D.   On: 02/04/2021 21:21        Scheduled Meds:  carvedilol  25 mg Oral BID WC   docusate sodium  100 mg Oral BID   enoxaparin (LOVENOX) injection  70 mg Subcutaneous Q24H   finasteride  5 mg Oral Daily   insulin aspart  0-6 Units Subcutaneous Q6H   pravastatin  10 mg Oral QHS   tamsulosin  0.4 mg Oral QHS   Continuous Infusions:  sodium chloride 75 mL/hr at 02/05/21 0857    Assessment & Plan:   Active Problems:   DM (diabetes mellitus), type 2 ([place])   Atrial fibrillation (H[facility])   Hypomagnesemia   Fall at home, initial encounter   Cirrhosis of liver ([place])   Anemia   Disp supracondyl fx w/o intracondylar extension of right distal femur ([place])   Acute distal right femoral fracture -Orthopedic surgery consulted, patient was taken to the OR on 7/11 and he is status post ORIF of right distal femur fracture as well as closed treatment of the patella fracture. -DVT prophylaxis with Lovenox -PT therapies pending, patient would prefer SNF/rehab Per Ortho  TDWB RLE Okay to leave open to air the dressing incision Okay to begin showering with assistant on 02/07/2021 Pain management Follow-up with Ortho in 2 weeks   Active Problems Acute kidney injury-creatinine jumped to 1.66 today from normal values on admission.  Possibly postoperatively, I wonder whether he had some hypotensive episodes but not clear to me. 7/13 creatinine worsening. Holding furosemide and spironolactone Will check bladder scan 3 times daily, I's and O cath if bladder scan more than 300 mL Will obtain renal ultrasound    PAF-not on anticoagulation due to history of liver cirrhosis and prior GI  bleeds.   7/13 he takes aspirin.  He is on Coreg at home.   Rate controlled      Type 2 diabetes mellitus -most recent A1c 6.9.   BG stable  R-ISS      Liver cirrhosis, thrombocytopenia-diagnosed about couple years ago.  NASH/EtOH is mentioned in the GI notes.  Patient does report some EtOH use but has not been drinking for more than a decade.   -Overall stable, platelets 108 today.  Continue to monitor   Hypomagnesemia-repleted   BPH-continue home medications with tamsulosin, finasteride, bladder scan today due to acute kidney injury.   Obesity class III-BMI > 40, he would benefit from weight loss     DVT prophylaxis: lovenox Code Status:dnr Family Communication: None at bedside Disposition Plan:  Status is: Inpatient  Remains inpatient appropriate because:Inpatient level of care appropriate due to severity of illness  Dispo: The patient is from: Home              Anticipated d/c is to: SNF              Patient currently is not medically stable to d/c.   Difficult to place patient No            LOS: 3 days   Time spent: 35 minutes with more than 50% on [place], MD [facility] Pager [phone]  If 7PM-7AM, please contact night-coverage 02/06/2021, 8:34 AM

## 2021-02-06 NOTE — Anesthesia Postprocedure Evaluation (Signed)
Anesthesia [name] Note  Patient: Brandon [name]  Procedure(s) Performed: OPEN REDUCTION INTERNAL FIXATION (ORIF) DISTAL FEMUR FRACTURE (Right: Leg Upper)     Patient location during evaluation: PACU Anesthesia Type: General Level of consciousness: awake and alert Pain management: pain level controlled Vital Signs Assessment: [name]-procedure vital signs reviewed and stable Respiratory status: spontaneous breathing, nonlabored ventilation, respiratory function stable and patient connected to nasal cannula oxygen Cardiovascular status: blood pressure returned to baseline and stable Postop Assessment: no apparent nausea or vomiting Anesthetic complications: no   No notable events documented.  Last Vitals:  Vitals:   02/06/21 0725 02/06/21 1220  BP: 106/61 (!) 123/55  Pulse: 90 100  Resp: 18 20  Temp: 36.4 C 37.2 C  SpO2: 96% 96%    Last Pain:  Vitals:   02/06/21 1220  TempSrc: Oral  PainSc:                  Catalina Gravel

## 2021-02-06 NOTE — Plan of Care (Signed)

## 2021-02-07 LAB — BASIC METABOLIC PANEL
Anion gap: 6 (ref 5–15)
BUN: 45 mg/dL — ABNORMAL HIGH (ref 8–23)
CO2: 27 mmol/L (ref 22–32)
Calcium: 8.5 mg/dL — ABNORMAL LOW (ref 8.9–10.3)
Chloride: 102 mmol/L (ref 98–111)
Creatinine, Ser: 1.48 mg/dL — ABNORMAL HIGH (ref 0.61–1.24)
GFR, Estimated: 49 mL/min — ABNORMAL LOW (ref >60)
Glucose, Bld: 142 mg/dL — ABNORMAL HIGH (ref 70–99)
Potassium: 4.4 mmol/L (ref 3.5–5.1)
Sodium: 135 mmol/L (ref 135–145)

## 2021-02-07 LAB — GLUCOSE, CAPILLARY
Glucose-Capillary: 126 mg/dL — ABNORMAL HIGH (ref 70–99)
Glucose-Capillary: 140 mg/dL — ABNORMAL HIGH (ref 70–99)
Glucose-Capillary: 143 mg/dL — ABNORMAL HIGH (ref 70–99)
Glucose-Capillary: 184 mg/dL — ABNORMAL HIGH (ref 70–99)

## 2021-02-07 LAB — CBC
HCT: 28.6 % — ABNORMAL LOW (ref 39.0–52.0)
Hemoglobin: 8.6 g/dL — ABNORMAL LOW (ref 13.0–17.0)
MCH: 29.2 pg (ref 26.0–34.0)
MCHC: 30.1 g/dL (ref 30.0–36.0)
MCV: 96.9 fL (ref 80.0–100.0)
Platelets: 96 10*3/uL — ABNORMAL LOW (ref 150–400)
RBC: 2.95 MIL/uL — ABNORMAL LOW (ref 4.22–5.81)
RDW: 14.2 % (ref 11.5–15.5)
WBC: 5.4 10*3/uL (ref 4.0–10.5)
nRBC: 0.4 % — ABNORMAL HIGH (ref 0.0–0.2)

## 2021-02-07 MED ORDER — POLYETHYLENE GLYCOL 3350 17 G PO PACK
17.0000 g | PACK | Freq: Every day | ORAL | Status: DC
  Administered 2021-02-07 – 2021-02-11 (×5): 17 g via ORAL
  Filled 2021-02-07 (×5): qty 1

## 2021-02-07 NOTE — Progress Notes (Signed)
Attempted a midline x 2 sticks without success. Pt veins appear straight, however, when the needle in inserted, unable to walk in due to splitting. Primary RN to call attending MD to check if IVF can be discontinued. Pt. Refuses ant further sticks at this time.

## 2021-02-07 NOTE — Progress Notes (Signed)
PT notified this RN that patients incision site was bleeding. This RN went in to assess and site continued to bleed even though pressure being applied. Notified team via secure chat. Placed dressing on leaking incisional site and team states they will check it out tomorrow. Patient stable at this time, NAD.

## 2021-02-07 NOTE — Plan of Care (Signed)

## 2021-02-07 NOTE — Progress Notes (Signed)
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  He was found to have an acute distal right femoral fracture, orthopedic surgery was consulted and he was admitted to the hospital.  There was no syncope/loss of consciousness it appears that this was a mechanical fall  7/13 renal function worsening.  Creatinine 1.74 today 7/14-urinating per nsg. Bladder scan was 300 but then urinate overnight. Today 433ml in canister and 70 post void bladder scan.   Consultants:  Orthopedics  Procedures:   Antimicrobials:      Subjective: No abd pain, sob, cp. No bm x2 days  Objective: Vitals:   02/06/21 1955 02/07/21 0500 02/07/21 0830 02/07/21 1220  BP: (!) 101/57  113/70 (!) 106/58  Pulse: 68  90 71  Resp: 18  18 19   Temp: 98.3 F (36.8 C)  98.6 F (37 C) 98.2 F (36.8 C)  TempSrc: Oral  Oral Oral  SpO2: 98%  94% 96%  Weight:  (!) 155.9 kg    Height:        Intake/Output Summary (Last 24 hours) at 02/07/2021 1713 Last data filed at 02/07/2021 6010 Gross per 24 hour  Intake 1360.32 ml  Output 1100 ml  Net 260.32 ml   Filed Weights   02/05/21 0500 02/06/21 0500 02/07/21 0500  Weight: (!) 157.7 kg (!) 157.1 kg (!) 155.9 kg    Examination:  Nad calm Cta no w/r Regular s1/s2 no gallop Soft benign +bs  No edema Aaxox4 grossly intact    Data Reviewed: I have personally reviewed following labs and imaging studies  CBC: Recent Labs  Lab 02/03/21 2355 02/04/21 0351 02/04/21 1829 02/05/21 0310 02/06/21 0425 02/07/21 0209  WBC 9.2 11.9* 11.0* 8.8 6.7 5.4  NEUTROABS 7.3  --   --   --   --   --   HGB 11.5* 13.3 11.3* 9.8* 9.0* 8.6*  HCT  38.9* 43.4 36.9* 32.2* 31.0* 28.6*  MCV 96.8 96.0 95.8 96.7 97.5 96.9  PLT 127* 139* 139* 108* 88* 96*   Basic Metabolic Panel: Recent Labs  Lab 02/03/21 2355 02/04/21 0351 02/04/21 1829 02/05/21 0310 02/06/21 0425 02/07/21 0209  NA 140 138  --  139 136 135  K 5.0 4.6  --  5.0 4.7 4.4  CL 105 100  --  103 102 102  CO2 29 29  --  33* 26 27  GLUCOSE 143* 172*  --  138* 132* 142*  BUN 18 18  --  27* 38* 45*  CREATININE 0.98 1.09 1.20 1.66* 1.74* 1.48*  CALCIUM 8.6* 9.6  --  8.8* 8.5* 8.5*  MG 1.5* 2.0  --   --  1.7  --   PHOS  --   --   --   --  3.5  --    GFR: Estimated Creatinine Clearance: 62.3 mL/min (A) (by C-G formula based on SCr of 1.48 mg/dL (H)). Liver Function Tests: Recent Labs  Lab 02/03/21 2355 02/04/21 0351 02/06/21 0425  AST 28 21 18   ALT 16 19 7   ALKPHOS 77 95 74  BILITOT 1.0 1.2 0.7  PROT 5.2* 6.5 5.3*  ALBUMIN 2.5* 3.0* 2.6*   No  results for input(s): LIPASE, AMYLASE in the last 168 hours. No results for input(s): AMMONIA in the last 168 hours. Coagulation Profile: Recent Labs  Lab 02/03/21 2355 02/04/21 0351 02/06/21 0425  INR 1.1 1.1 1.3*   Cardiac Enzymes: No results for input(s): CKTOTAL, CKMB, CKMBINDEX, TROPONINI in the last 168 hours. BNP (last 3 results) No results for input(s): PROBNP in the last 8760 hours. HbA1C: No results for input(s): HGBA1C in the last 72 hours. CBG: Recent Labs  Lab 02/06/21 1218 02/06/21 1736 02/07/21 0010 02/07/21 0600 02/07/21 1219  GLUCAP 140* 162* 184* 140* 126*   Lipid Profile: No results for input(s): CHOL, HDL, LDLCALC, TRIG, CHOLHDL, LDLDIRECT in the last 72 hours. Thyroid Function Tests: No results for input(s): TSH, T4TOTAL, FREET4, T3FREE, THYROIDAB in the last 72 hours. Anemia Panel: No results for input(s): VITAMINB12, FOLATE, FERRITIN, TIBC, IRON, RETICCTPCT in the last 72 hours. Sepsis Labs: No results for input(s): PROCALCITON, LATICACIDVEN in the last 168 hours.  Recent Results  (from the past 240 hour(s))  Resp Panel by RT-PCR (Flu A&B, Covid) Nasopharyngeal Swab     Status: None   Collection Time: 02/03/21 11:45 PM   Specimen: Nasopharyngeal Swab; Nasopharyngeal(NP) swabs in vial transport medium  Result Value Ref Range Status   SARS Coronavirus 2 by RT PCR NEGATIVE NEGATIVE Final    Comment: (NOTE) SARS-CoV-2 target nucleic acids are NOT DETECTED.  The SARS-CoV-2 RNA is generally detectable in upper respiratory specimens during the acute phase of infection. The lowest concentration of SARS-CoV-2 viral copies this assay can detect is 138 copies/mL. A negative result does not preclude SARS-Cov-2 infection and should not be used as the sole basis for treatment or other patient management decisions. A negative result may occur with  improper specimen collection/handling, submission of specimen other than nasopharyngeal swab, presence of viral mutation(s) within the areas targeted by this assay, and inadequate number of viral copies(<138 copies/mL). A negative result must be combined with clinical observations, patient history, and epidemiological information. The expected result is Negative.  Fact Sheet for Patients:  EntrepreneurPulse.com.au  Fact Sheet for Healthcare Providers:  IncredibleEmployment.be  This test is no t yet approved or cleared by the [place] FDA and  has been authorized for detection and/or diagnosis of SARS-CoV-2 by FDA under an Emergency Use Authorization (EUA). This EUA will remain  in effect (meaning this test can be used) for the duration of the COVID-19 declaration under Section 564(b)(1) of the Act, 21 U.S.C.section 360bbb-3(b)(1), unless the authorization is terminated  or revoked sooner.       Influenza A by PCR NEGATIVE NEGATIVE Final   Influenza B by PCR NEGATIVE NEGATIVE Final    Comment: (NOTE) The Xpert Xpress SARS-CoV-2/FLU/RSV plus assay is intended as an aid in the  diagnosis of influenza from Nasopharyngeal swab specimens and should not be used as a sole basis for treatment. Nasal washings and aspirates are unacceptable for Xpert Xpress SARS-CoV-2/FLU/RSV testing.  Fact Sheet for Patients: EntrepreneurPulse.com.au  Fact Sheet for Healthcare Providers: IncredibleEmployment.be  This test is not yet approved or cleared by the [place] FDA and has been authorized for detection and/or diagnosis of SARS-CoV-2 by FDA under an Emergency Use Authorization (EUA). This EUA will remain in effect (meaning this test can be used) for the duration of the COVID-19 declaration under Section 564(b)(1) of the Act, 21 U.S.C. section 360bbb-3(b)(1), unless the authorization is terminated or revoked.  Performed at [facility], [place] [address] [place], [place] [address]  Radiology Studies: US RENAL  Result Date: 02/06/2021 CLINICAL DATA:  Acute kidney insufficiency. EXAM: RENAL / URINARY TRACT ULTRASOUND COMPLETE COMPARISON:  CT of the abdomen and pelvis 08/14/2019. MRI of the abdomen without and with contrast 10/24/2019 FINDINGS: Right Kidney: Renal measurements: 12.0 x 6.7 x 6.0 cm = volume: 251 mL. Lower pole of the right kidney is poorly visualized due to bowel gas and patient body habitus. Simple exophytic cyst measuring up to 2.3 mm is stable. No other focal lesions are present. No obstruction is present. Left Kidney: Renal measurements: 0.5 x 6.7 x 6.7 cm = volume: 267 mL. Simple cyst at the upper pole of the right kidney that simple cyst at the upper pole again demonstrated, measuring 4.1 x 3.9 x 3.5 cm no new lesions are present. Renal parenchyma otherwise normal. Bladder: Bladder wall thickness and irregularity again noted. No focal lesions present. Other: None. IMPRESSION: 1. No acute abnormality. 2. Stable bilateral simple renal cysts. 3. No obstruction. Electronically Signed   By: San Morelle  M.D.   On: 02/06/2021 19:09        Scheduled Meds:  carvedilol  25 mg Oral BID WC   docusate sodium  100 mg Oral BID   enoxaparin (LOVENOX) injection  70 mg Subcutaneous Q24H   finasteride  5 mg Oral Daily   insulin aspart  0-6 Units Subcutaneous Q6H   pravastatin  10 mg Oral QHS   tamsulosin  0.4 mg Oral QHS   Continuous Infusions:  sodium chloride 75 mL/hr at 02/07/21 1515    Assessment & Plan:   Active Problems:   DM (diabetes mellitus), type 2 ([place])   Atrial fibrillation (HCC)   Hypomagnesemia   Fall at home, initial encounter   Cirrhosis of liver ([place])   Anemia   Disp supracondyl fx w/o intracondylar extension of right distal femur ([place])   Acute distal right femoral fracture -Orthopedic surgery consulted, patient was taken to the OR on 7/11 and he is status post ORIF of right distal femur fracture as well as closed treatment of the patella fracture. -DVT prophylaxis with Lovenox -PT therapies pending, patient would prefer SNF/rehab Per Ortho TDWB RLE Okay to leave open to air the dressing incision 02/07/21 per ortho okay to begin showering with assistant on 7/14. Pain mx Bowel regimen-added miralax qd F/u with ortho in 2 weeks     Active Problems Acute kidney injury-creatinine jumped to 1.66 today from normal values on admission.  Possibly postoperatively, I wonder whether he had some hypotensive episodes but not clear to me. 7/13 creatinine worsening. Holding furosemide and spironolactone 7/14- improving with ivf. Was retaining urine initially but now improved. Will monitor I/'s   PAF-not on anticoagulation due to history of liver cirrhosis and prior GI bleeds.   7/14- on asa.  Continue coreg.  Rate control      Type 2 diabetes mellitus -most recent A1c 6.9.   BG stable Continue riss     Liver cirrhosis, thrombocytopenia-diagnosed about couple years ago.  NASH/EtOH is mentioned in the GI notes.  Patient does report some EtOH use but has not been  drinking for more than a decade.   Plat stable. Improving slowly.   Hypomagnesemia-repleted   BPH-continue home medications with tamsulosin, finasteride, bladder scan today due to acute kidney injury.   Obesity class III-BMI > 40, he would benefit from weight loss     DVT prophylaxis: lovenox Code Status:dnr Family Communication: None at bedside Disposition Plan:  Status is: Inpatient  Remains inpatient appropriate because:Inpatient level of care appropriate due to severity of illness  Dispo: The patient is from: Home              Anticipated d/c is to: SNF              Patient currently is not medically stable to d/c.   Difficult to place patient No            LOS: 4 days   Time spent: 35 minutes with more than 50% on [place], MD [facility] Pager [phone]  If 7PM-7AM, please contact night-coverage 02/07/2021, 5:13 PM

## 2021-02-07 NOTE — TOC Progression Note (Addendum)
Transition of Care [facility]) - Progression Note    Patient Details  Name: Brandon [name] MRN: [id number] Date of Birth: 03/20/1946  Transition of Care [facility]) CM/SW Contact  Milinda Antis, [place] Phone Number: 02/07/2021, 1:28 PM  Clinical Narrative:     13:15-  CSW contacted [facility] to inquire about their availability to accept the patient.  CSW was informed that they cannot accept the patient due to obesity (They do not accept patients over 300lbs).    CSW then contacted the agencies that accepted the patient in the [place].  Three agencies accepted but the patient reported that he is not willing to go back to one of the agencies.  13:23-  CSW contacted [facility] to inquire about whether they have a bed available.  CSW was informed that facility is unable to accept the patient due to obesity.  They do not accept patients over 250lbs. 13:29-  CSW contacted [facility] to inquire about their ability to accept the patient and was informed that they are unabe to accept the patient due to obesity.  They do not accept patients over 330lbs.    CSW then called the remaining agencies who have not reported whether they will be able to accept the patient.  13:54-  CSW contacted [place] at [facility].  There was no answer. CSW left a VM requesting a returned call.  14:00- CSW contacted Lattie Haw with [facility]  CSW was informed that the facility will review the referral today. 14:02- CSW contacted [facility].  CSW was informed that the agency would have to speak with leadership to discuss whether the agency will be able to accept the patient.    Pending bed offer.  14:25-  CSW received a returned call from [facility].  They are willing to accept the patient.    15:43-  CSW presented new bed offers.  The patient was in agreement with going to [facility].    15:45-  CSW called the patient's son and informed him of the information above.  The patient's son was also in agreement.   15:47-  CSW called [name] with [facility] and confirmed that the patient is willing to come to the facility.  The facility is still willing to accept patient.  TOC CMA notified of bed choice and the insurance authorization process has begun.  Pending- insurance authorization   Expected Discharge Plan: Skilled Nursing Facility Barriers to Discharge: Ship broker, SNF Pending bed offer, Continued Medical Work up  Expected Discharge Plan and Services Expected Discharge Plan: [place]                                               Social Determinants of Health (SDOH) Interventions    Readmission Risk Interventions Readmission Risk Prevention Plan 10/06/2019 10/06/2019  Transportation Screening - Complete  PCP or Specialist Appt within 3-5 Days Complete -  HRI or [place] - Complete  Social Work Consult for [place] Planning/Counseling - Complete  Palliative Care Screening - Not Applicable  Medication Review Press photographer) - Complete

## 2021-02-07 NOTE — TOC CAGE-AID Note (Signed)
Transition of Care [facility]) - CAGE-AID Screening   Patient Details  Name: Brandon [name] MRN: [id number] Date of Birth: October 23, 1945  Transition of Care [facility]) CM/SW Contact:    Gaetano Hawthorne Tarpley-Carter, [place] Phone Number: 02/07/2021, 1:57 PM   Clinical Narrative: Pt.participated in Cage-Aid.  Pt stated he use to drink approx. 3 years ago.  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  Pt was offered resources.  Pt stated they did not feel that they were in need of resources at this time.   [name] Tarpley-Carter, MSW, LCSW-A Pronouns:  She/Her/Hers                          [place] Clinical Social WorkerTransitions of Care Cell:  [phone] [email or web address]   CAGE-AID Screening: Substance Abuse Screening unable to be completed due to: : Patient unable to participate ((Pt is intubated))  Have You Ever Felt You Ought to Cut Down on Your Drinking or Drug Use?: No Have People Annoyed You By Critizing Your Drinking Or Drug Use?: No Have You Felt Bad Or Guilty About Your Drinking Or Drug Use?: No Have You Ever Had a Drink or Used Drugs First Thing In The Morning to Steady Your Nerves or to Get Rid of a Hangover?: No CAGE-AID Score: 0  Substance Abuse Education Offered: Yes

## 2021-02-07 NOTE — Progress Notes (Signed)
Bladder scan complete. 70 mL showing. No interventions at this time.

## 2021-02-07 NOTE — Progress Notes (Signed)
Physical Therapy Treatment Patient Details Name: Brandon [name] MRN: [id number] DOB: [birth year] Today's Date: 02/07/2021    History of Present Illness 75 yo male admitted 02/03/21 with onset of mechanical fall from a trip at home, came down on R knee. s/p ORIF R distal femur 02/04/21 and R patella was noted to be undisplaced.  Permitted ROM to R knee and TDWB.  02/07/21 small dihiscence of promximal aspect of incision/pressure dressing applied by RN. PMHx:  PAF, GI bleed, cirrhosis, chronic anemia, DM, thrombocytopenia    PT Comments    Pt supine in bed.  He is motivated to attempt progression but nervous of pain involved.  Able to progress to sitting at edge of bed but unable to move OOB as R thigh started to ooze and would not stop despite holding pressure.  He continues to require snf placement.  Currently max/total +2 for bed mobility.      Follow Up Recommendations  SNF     Equipment Recommendations  None recommended by PT    Recommendations for Other Services       Precautions / Restrictions Precautions Precautions: Fall Precaution Comments: monitor vitals- cardiac history and chronic back pain. Restrictions Weight Bearing Restrictions: Yes RLE Weight Bearing: Touchdown weight bearing Other Position/Activity Restrictions: painful to bend RLE    Mobility  Bed Mobility Overal bed mobility: Needs Assistance Bed Mobility: Supine to Sit;Sit to Supine     Supine to sit: Max assist;+2 for physical assistance Sit to supine: Total assist;+2 for physical assistance (+3 for back to bed.)   General bed mobility comments: Pt move to edge of bed with +2 assistance.  He presents with posterior pelvic tilt and posterior LOB.  Required cues for weight shifting forward and hand placement to avoid LOB posterior.  Pt able to sit edge of bed x 10 min with intermittent assistance.  Incision at proximal border started to ooze and RN applied pressure and unable to resolve bleeding returned back to  bed and ortho PA informed.    Transfers                 General transfer comment: unable to move OOB to chair due to complications with bleeding to R thigh.  Pt will need mechanical lift to move OOB to recliner.  Ambulation/Gait                 Stairs             Wheelchair Mobility    Modified Rankin (Stroke Patients Only)       Balance Overall balance assessment: History of Falls;Needs assistance Sitting-balance support: Feet supported;Bilateral upper extremity supported Sitting balance-Leahy Scale: Poor Sitting balance - Comments: poor balance with intermittent mod assistance to maintain sitting. at times able to sit unsupported.  He fatigues quickly in sitting. Postural control: Posterior lean;Right lateral lean                                  Cognition Arousal/Alertness: Awake/alert Behavior During Therapy: WFL for tasks assessed/performed Overall Cognitive Status: Within Functional Limits for tasks assessed                                        Exercises General Exercises - Lower Extremity Ankle Circles/Pumps: AROM;Both;20 reps;Supine Quad Sets: AROM;Both;10 reps;Supine    General Comments  Pertinent Vitals/Pain Pain Assessment: Faces Faces Pain Scale: Hurts even more Pain Location: R knee with movement Pain Descriptors / Indicators: Grimacing;Guarding Pain Intervention(s): Monitored during session;Repositioned    Home Living                      Prior Function            PT Goals (current goals can now be found in the care plan section) Acute Rehab PT Goals Patient Stated Goal: To get back home again Potential to Achieve Goals: Good Progress towards PT goals: Progressing toward goals    Frequency    Min 3X/week      PT Plan Current plan remains appropriate    Co-evaluation              AM-PAC PT "6 Clicks" Mobility   Outcome Measure  Help needed turning from  your back to your side while in a flat bed without using bedrails?: A Lot Help needed moving from lying on your back to sitting on the side of a flat bed without using bedrails?: A Lot Help needed moving to and from a bed to a chair (including a wheelchair)?: Total Help needed standing up from a chair using your arms (e.g., wheelchair or bedside chair)?: Total Help needed to walk in hospital room?: Total Help needed climbing 3-5 steps with a railing? : Total 6 Click Score: 8    End of Session Equipment Utilized During Treatment: Oxygen Activity Tolerance: Patient limited by fatigue;Patient limited by pain Patient left: in bed;with call bell/phone within reach;with bed alarm set Nurse Communication: Mobility status;Other (comment) (bleeding to R thigh) PT Visit Diagnosis: Muscle weakness (generalized) (M62.81);History of falling (Z91.81);Pain;Difficulty in walking, not elsewhere classified (R26.2) Pain - Right/Left: Right Pain - part of body: Leg;Knee;Hip     Time: 1350-1425 PT Time Calculation (min) (ACUTE ONLY): 35 min  Charges:  $Therapeutic Exercise: 8-22 mins $Therapeutic Activity: 8-22 mins                     Erasmo Leventhal , PTA Acute Rehabilitation Services Pager [phone] Office [phone]    Kannan Proia Eli Hose 02/07/2021, 2:42 PM

## 2021-02-08 ENCOUNTER — Inpatient Hospital Stay (HOSPITAL_COMMUNITY): Payer: Medicare Other

## 2021-02-08 LAB — GLUCOSE, CAPILLARY
Glucose-Capillary: 116 mg/dL — ABNORMAL HIGH (ref 70–99)
Glucose-Capillary: 119 mg/dL — ABNORMAL HIGH (ref 70–99)
Glucose-Capillary: 144 mg/dL — ABNORMAL HIGH (ref 70–99)
Glucose-Capillary: 144 mg/dL — ABNORMAL HIGH (ref 70–99)

## 2021-02-08 LAB — CREATININE, SERUM
Creatinine, Ser: 1.16 mg/dL (ref 0.61–1.24)
GFR, Estimated: >60 mL/min (ref >60)

## 2021-02-08 IMAGING — DX DG CHEST 1V PORT
1 series · 1 of 1 positions shown · non-contrast
Comparison: [DATE]

CLINICAL DATA: Dyspnea

EXAM:
PORTABLE CHEST 1 VIEW

[chest ap]
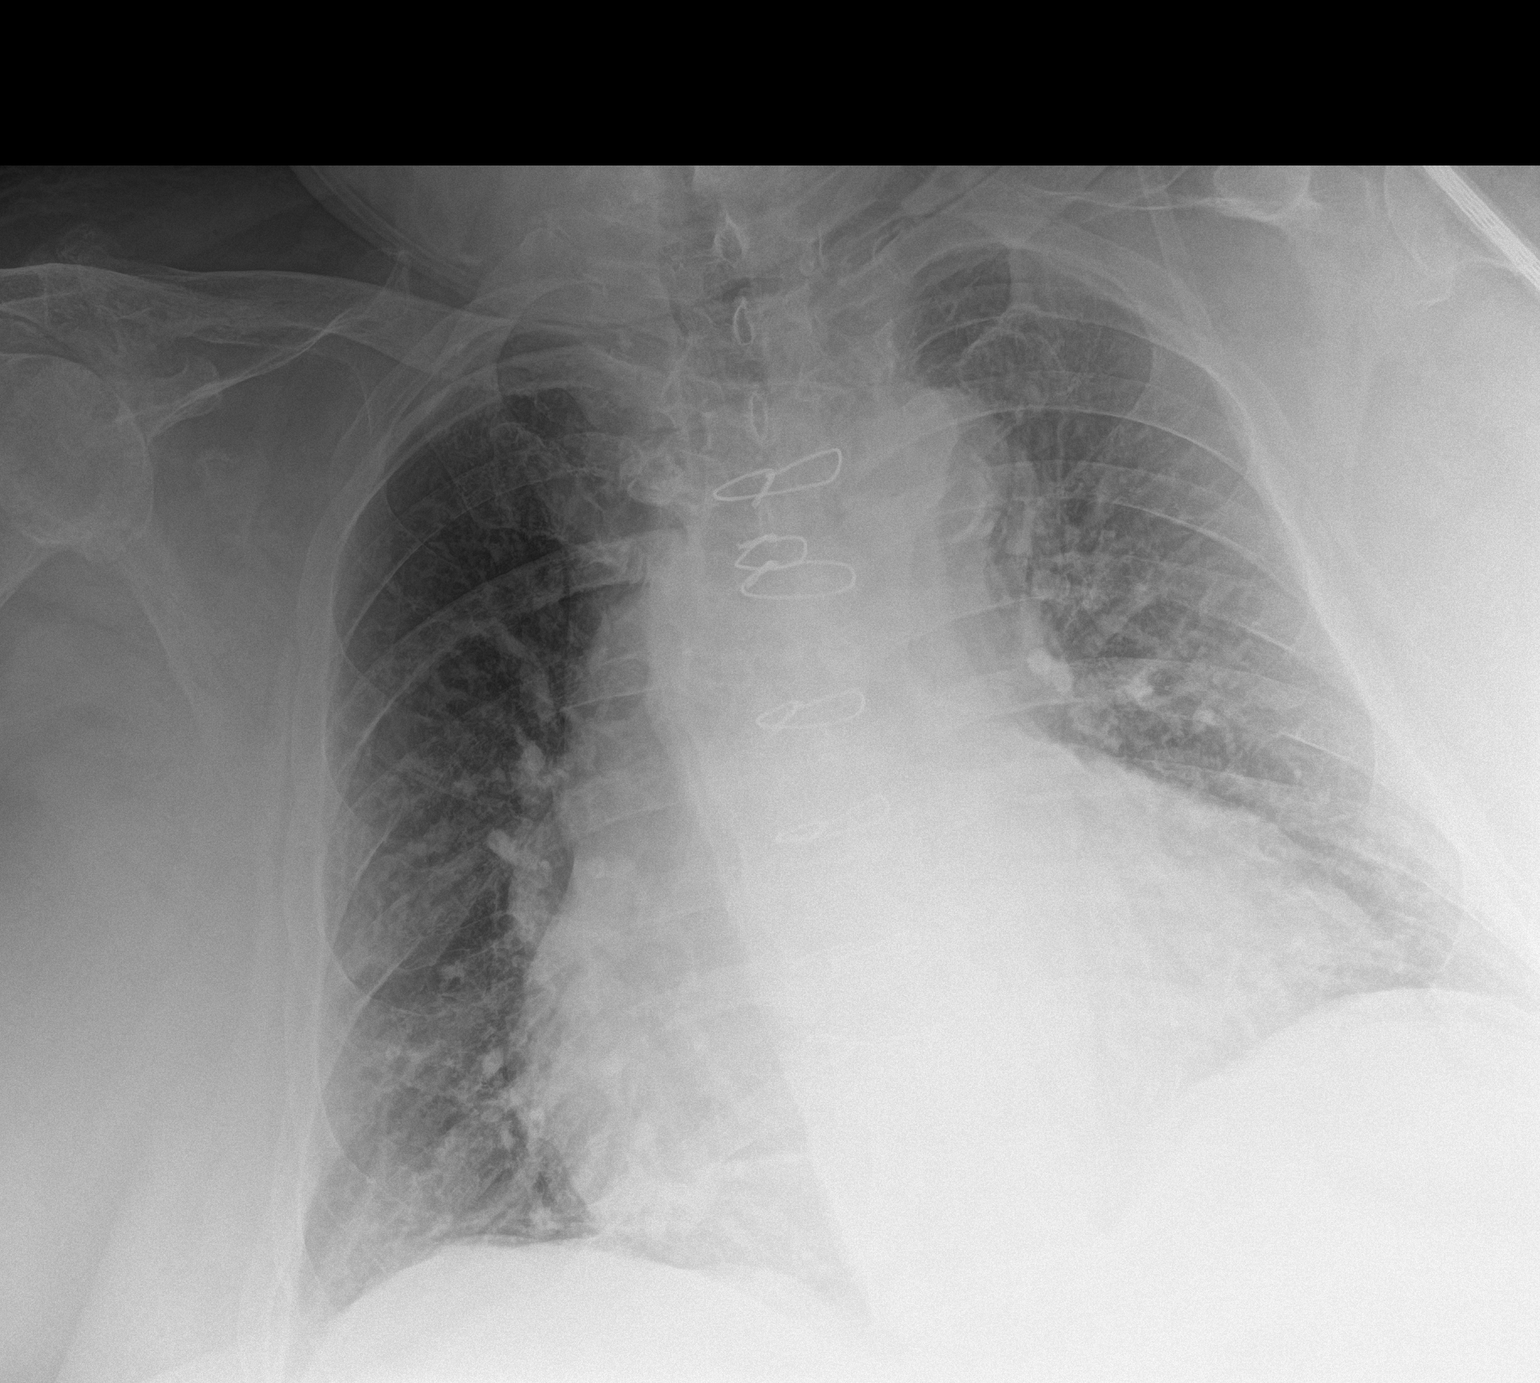

[1 of 1 positions shown; findings below may reference images not displayed]

FINDINGS: Post CABG changes. Stable cardiomegaly. Atherosclerotic
calcification of the aortic knob. Diffuse interstitial opacities
throughout both lungs. No appreciable pleural fluid collection. No
pneumothorax.
IMPRESSION: Diffuse interstitial opacities throughout both lungs suggesting
pulmonary edema versus atypical/viral infection. Findings slightly
progressed from previous radiograph.

## 2021-02-08 MED ORDER — FUROSEMIDE 10 MG/ML IJ SOLN
40.0000 mg | Freq: Once | INTRAMUSCULAR | Status: AC
  Administered 2021-02-08: 40 mg via INTRAVENOUS
  Filled 2021-02-08: qty 4

## 2021-02-08 MED ORDER — POLYETHYLENE GLYCOL 3350 17 G PO PACK: 17.0000 g | PACK | Freq: Every day | ORAL | 0 refills | Status: DC

## 2021-02-08 MED ORDER — NYSTATIN 100000 UNIT/GM EX POWD: 1.0000 "application " | Freq: Every day | CUTANEOUS | Status: DC | PRN

## 2021-02-08 NOTE — TOC Progression Note (Signed)
Transition of Care [facility]) - Progression Note    Patient Details  Name: Brandon [name] MRN: [id number] Date of Birth: 1945-11-25  Transition of Care [facility]) CM/SW Contact  Milinda Antis, [place] Phone Number: 02/08/2021, 10:19 AM  Clinical Narrative:     10:18-  CSW received information from RN that attending stated that patient is medically cleared to d/c.  CSW called [facility] to confirm that they have a bed available today.  There was no answer.  CSW left a VM requesting a returned call.   Pending: facility confirmation  10:23- CSW received a returned call from [place].  The facility is able to accept the patient today.    Expected Discharge Plan: Skilled Nursing Facility Barriers to Discharge: Insurance Authorization, SNF Pending bed offer, Continued Medical Work up  Expected Discharge Plan and Services Expected Discharge Plan: [place]                                               Social Determinants of Health (SDOH) Interventions    Readmission Risk Interventions Readmission Risk Prevention Plan 10/06/2019 10/06/2019  Transportation Screening - Complete  PCP or Specialist Appt within 3-5 Days Complete -  HRI or [place] - Complete  Social Work Consult for [place] Planning/Counseling - Complete  Palliative Care Screening - Not Applicable  Medication Review Press photographer) - Complete

## 2021-02-08 NOTE — Discharge Summary (Addendum)
Physician Discharge Summary  Brandon [name] YWV:[id number] DOB: [birth year] DOA: 02/03/2021  PCP: Helane Rima, MD  Admit date: 02/03/2021 Discharge date: 02/08/2021  Admitted From: Home  Disposition:  SNF   Recommendations for Outpatient Follow-up:  Follow up with PCP Dr> Lavone Neri within 1 week of SNF dischargein  Follow up with Orthopedics Dr. Doreatha Martin in 2 weeks, SNF to transport please SNF: Please obtain BMP in 5 days      Home Health: N/A at SNF  Equipment/Devices: TBD at SNF  Discharge Condition: Fair  CODE STATUS: DNR Diet recommendation: Low sodium  Brief/Interim Summary: Brandon [name] is a 75 y.o. M with pAF not on AC due to recurrent GIB, hx alcohol cirrhosis compensated, and DM, obesity and anemia who presented with leg pain after a fall.  Mechanical fall at home, in the ER, found to have an acute distal right femoral fracture, orthopedic surgery was consulted and he was admitted to the hospital.     Tusculum DIAGNOSIS: Right distal femur fracture    Discharge Diagnoses:   Acute distal right femoral fracture Orthopedic surgery consulted, patient was taken to the OR on 7/11 for ORIF by Dr. Doreatha Martin.    Post-op course complicated by mild AKI, resolved.    May be touch down weight bearing to right leg.  PT at SNF.  Okay to leave open to air the dressing incision.  May shower.  Lovenox for DVT prophylaxis.  Follow up with Dr. Doreatha Martin in 2 weeks     ADDENDUM: After discharge orders were written, patient developed recurrent shortness of breath.  CXR obtained, showed increasing bilateral infiltrates.  Suspect CHF with held diuretics and fluids. -Hold discharge -IV lasix -Close monitoring renal function tomorrow given previous AKI -Strict I/Os      Acute kidney injury Baseline Cr 1.1, increased to 1.7 post-op.  Diuretics held, IVF given.  Creatinine resolved  Resume diuretics at discharge    Paroxysmal atrial fibrillation Not on anticoagulation due  to history of liver cirrhosis and prior GI bleeds.      Type 2 diabetes mellitus  Most recent A1c 6.9.    Liver cirrhosis Thrombocytopenia NASH/EtOH etiology.  Compensated here.      Hypomagnesemia Treated and resolved   Obesity class III   BMI > 40             Discharge Instructions  Discharge Instructions     Diet - low sodium heart healthy   Complete by: As directed    Discharge wound care:   Complete by: As directed    Cover for comfort. No dressing needed. Wash with warm water and pat dry PRN   Increase activity slowly   Complete by: As directed       Allergies as of 02/08/2021   No Known Allergies      Medication List     STOP taking these medications    predniSONE 5 MG tablet Commonly known as: DELTASONE   senna-docusate 8.6-50 MG tablet Commonly known as: Senokot-S       TAKE these medications    acetaminophen 500 MG tablet Commonly known as: TYLENOL Take 1,000-1,500 mg by mouth every 6 (six) hours as needed for moderate pain.   carvedilol 25 MG tablet Commonly known as: COREG Take 25 mg by mouth 2 (two) times daily with a meal.   docusate sodium 100 MG capsule Commonly known as: COLACE Take 1 capsule (100 mg total) by mouth 2 (two) times daily.   enoxaparin 80 MG/0.8ML injection  Commonly known as: LOVENOX Inject 0.7 mLs (70 mg total) into the skin daily.   finasteride 5 MG tablet Commonly known as: PROSCAR Take 1 tablet (5 mg total) by mouth daily.   furosemide 20 MG tablet Commonly known as: LASIX Take 20 mg by mouth 2 (two) times daily.   HYDROcodone-acetaminophen 5-325 MG tablet Commonly known as: NORCO/VICODIN Take 1-2 tablets by mouth every 4 (four) hours as needed for moderate pain (pain score 4-6).   hydrOXYzine 25 MG tablet Commonly known as: ATARAX/VISTARIL Take 1 tablet (25 mg total) by mouth daily.   metFORMIN 500 MG 24 hr tablet Commonly known as: GLUCOPHAGE-XR Take 500 mg by mouth daily with  breakfast.   nystatin powder Commonly known as: MYCOSTATIN/NYSTOP Apply 1 application topically daily as needed (irritation).   ondansetron 4 MG tablet Commonly known as: ZOFRAN Take 1 tablet (4 mg total) by mouth every 6 (six) hours as needed for nausea.   pantoprazole 40 MG tablet Commonly known as: PROTONIX Take 1 tablet (40 mg total) by mouth daily.   polyethylene glycol 17 g packet Commonly known as: MIRALAX / GLYCOLAX Take 17 g by mouth daily. Start taking on: February 09, 2021 What changed: when to take this   pravastatin 10 MG tablet Commonly known as: PRAVACHOL Take 10 mg by mouth at bedtime.   spironolactone 50 MG tablet Commonly known as: ALDACTONE Take 50 mg by mouth daily.   tamsulosin 0.4 MG Caps capsule Commonly known as: FLOMAX Take 0.4 mg by mouth at bedtime.   triamcinolone cream 0.1 % Commonly known as: KENALOG Apply 1 application topically 2 (two) times daily.               Discharge Care Instructions  (From admission, onward)           Start     Ordered   02/08/21 0000  Discharge wound care:       Comments: Cover for comfort. No dressing needed. Wash with warm water and pat dry PRN   02/08/21 1117            Follow-up Information     Haddix, Thomasene Lot, MD. Schedule an appointment as soon as possible for a visit in 2 week(s).   Specialty: Orthopedic Surgery Why: for repeat x-rays and wound check Contact information: [place] [address] [phone]         Helane Rima, MD. Schedule an appointment as soon as possible for a visit in 1 week(s).   Specialty: Family Medicine Contact information: [place] [address] [phone]                No Known Allergies  Consultations: Orthopedics   Procedures/Studies: DG Chest 2 View  Result Date: 02/03/2021 CLINICAL DATA:  Preoperative radiography EXAM: CHEST - 2 VIEW COMPARISON:  09/16/2019 FINDINGS:  Prior sternotomy and CABG. Cardiomegaly. Tortuous and calcified aorta. Remaining cardiomediastinal contours are unremarkable. Pulmonary vascular congestion with indistinct and cephalized pulmonary vascularity. Some hazy reticular opacities are present with mild fissural thickening and few septal lines which could reflect combination of atelectasis and mild interstitial edema. No consolidative process. No pneumothorax. No effusion. Degenerative changes in the shoulders, right greater than left. Multilevel degenerative changes are present in the imaged portions of the spine. IMPRESSION: Appearance most compatible with mild CHF/volume overload with cardiomegaly, vascular congestion and hazy interstitial opacity suggestive of pulmonary edema. Aortic Atherosclerosis (ICD10-I70.0). Electronically Signed   By: March Rummage  [facility] M.D.   On: 02/03/2021 23:00   DG Pelvis 1-2 Views  Result Date: 02/03/2021 CLINICAL DATA:  Pain after fall EXAM: PELVIS - 1-2 VIEW COMPARISON:  CT August 14, 2019 FINDINGS: Diffuse demineralization of bone. There is no evidence of pelvic fracture or diastasis. Degenerative change of the hips. Lower lumbar spondylosis. IMPRESSION: No acute displaced fracture or dislocation on this single AP view of the pelvis. Electronically Signed   By: Dahlia Bailiff MD   On: 02/03/2021 22:30   DG Knee 2 Views Right  Result Date: 02/03/2021 CLINICAL DATA:  Pain after fall EXAM: RIGHT KNEE - 1-2 VIEW COMPARISON:  Radiograph September 30, 2018 FINDINGS: Diffuse demineralization of bone. Comminuted impacted fracture of the distal femoral metadiaphysis with approximately 13 mm medial displacement and lateral angulation of the distal fracture fragment, without definite intra-articular extension. Irregularity of the upper pole of patella, related to prior avulsion fracture. Joint effusion. 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 Vascular calcifications. IMPRESSION: 1. Comminuted, displaced and  angulated distal femoral metadiaphyseal fracture, without definite intra-articular extension. 2. Irregularity of the upper pole of patella, related to prior known avulsion fracture. 3. 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 Electronically Signed   By: Dahlia Bailiff MD   On: 02/03/2021 22:28   CT Head Wo Contrast  Result Date: 02/03/2021 CLINICAL DATA:  75 year old male with neck trauma. EXAM: CT HEAD WITHOUT CONTRAST CT CERVICAL SPINE WITHOUT CONTRAST TECHNIQUE: Multidetector CT imaging of the head and cervical spine was performed following the standard protocol without intravenous contrast. Multiplanar CT image reconstructions of the cervical spine were also generated. COMPARISON:  None. FINDINGS: CT HEAD FINDINGS Brain: Mild age-related atrophy and chronic microvascular ischemic changes. There is no acute intracranial hemorrhage. No mass effect or midline shift no extra-axial fluid collection. Vascular: No hyperdense vessel or unexpected calcification. Skull: Normal. Negative for fracture or focal lesion. Sinuses/Orbits: There is complete opacification of the left maxillary sinus. The remainder of the visualized paranasal sinuses and mastoid air cells are clear. No air-fluid level. Other: None CT CERVICAL SPINE FINDINGS Alignment: No acute subluxation. There is straightening of normal cervical lordosis which may be positional or due to muscle spasm. Skull base and vertebrae: No acute fracture. Osteopenia. Soft tissues and spinal canal: No prevertebral fluid or swelling. No visible canal hematoma. Disc levels: Multilevel degenerative changes with disc space narrowing and spurring. Upper chest: Negative. Other: Bilateral carotid bulb calcified plaques. IMPRESSION: 1. No acute intracranial pathology. Mild age-related atrophy and chronic microvascular ischemic changes. 2. No acute/traumatic cervical spine pathology. Multilevel degenerative changes. Electronically Signed    By: Anner Crete M.D.   On: 02/03/2021 22:41   CT Cervical Spine Wo Contrast  Result Date: 02/03/2021 CLINICAL DATA:  75 year old male with neck trauma. EXAM: CT HEAD WITHOUT CONTRAST CT CERVICAL SPINE WITHOUT CONTRAST TECHNIQUE: Multidetector CT imaging of the head and cervical spine was performed following the standard protocol without intravenous contrast. Multiplanar CT image reconstructions of the cervical spine were also generated. COMPARISON:  None. FINDINGS: CT HEAD FINDINGS Brain: Mild age-related atrophy and chronic microvascular ischemic changes. There is no acute intracranial hemorrhage. No mass effect or midline shift no extra-axial fluid collection. Vascular: No hyperdense vessel or unexpected calcification. Skull: Normal. Negative for fracture or focal lesion. Sinuses/Orbits: There is complete opacification of the left maxillary sinus. The remainder of the visualized paranasal sinuses and mastoid air cells are clear. No air-fluid level. Other: None CT CERVICAL SPINE FINDINGS Alignment: No acute subluxation. There is  straightening of normal cervical lordosis which may be positional or due to muscle spasm. Skull base and vertebrae: No acute fracture. Osteopenia. Soft tissues and spinal canal: No prevertebral fluid or swelling. No visible canal hematoma. Disc levels: Multilevel degenerative changes with disc space narrowing and spurring. Upper chest: Negative. Other: Bilateral carotid bulb calcified plaques. IMPRESSION: 1. No acute intracranial pathology. Mild age-related atrophy and chronic microvascular ischemic changes. 2. No acute/traumatic cervical spine pathology. Multilevel degenerative changes. Electronically Signed   By: Anner Crete M.D.   On: 02/03/2021 22:41   CT Knee Right Wo Contrast  Result Date: 02/03/2021 CLINICAL DATA:  Recent fall with known distal right femoral fracture EXAM: CT OF THE RIGHT KNEE WITHOUT CONTRAST TECHNIQUE: Multidetector CT imaging of the right knee  was performed according to the standard protocol. Multiplanar CT image reconstructions were also generated. COMPARISON:  Plain film from earlier in the same day as well as prior CT from 09/30/2018 FINDINGS: Bones/Joint/Cartilage There is again noted a comminuted fracture of the distal right femur involving primarily the distal aspect of the diaphysis and proximal metaphysis. Posterior and lateral displacement of the distal fracture fragments is noted. Some mild impaction is seen as well with approximately 1 cm of bony overlap along the medial aspect of the distal femur. No articular involvement is identified. Osteophytic changes are noted in all 3 joint compartments. Proximal tibia and fibula show no acute fracture. Further fragmentation in the anterior and inferior aspect of the patella is noted although no significant distraction is seen. Ligaments Suboptimally assessed by CT. Muscles and Tendons Surrounding musculature demonstrates some fatty infiltration likely related to disuse atrophy. Soft tissues Joint effusion is noted. Some prepatellar soft tissue swelling and surrounding subcutaneous edema is noted as well. IMPRESSION: Comminuted and displaced distal right femoral fracture without significant intra-articular involvement. Associated soft tissue swelling and joint effusion is noted. Further displacement of patellar fracture when compared with the previous exam. No distraction of the patellar fragments is seen however. Tricompartmental degenerative changes. Electronically Signed   By: Inez Catalina M.D.   On: 02/03/2021 23:06   US RENAL  Result Date: 02/06/2021 CLINICAL DATA:  Acute kidney insufficiency. EXAM: RENAL / URINARY TRACT ULTRASOUND COMPLETE COMPARISON:  CT of the abdomen and pelvis 08/14/2019. MRI of the abdomen without and with contrast 10/24/2019 FINDINGS: Right Kidney: Renal measurements: 12.0 x 6.7 x 6.0 cm = volume: 251 mL. Lower pole of the right kidney is poorly visualized due to bowel  gas and patient body habitus. Simple exophytic cyst measuring up to 2.3 mm is stable. No other focal lesions are present. No obstruction is present. Left Kidney: Renal measurements: 0.5 x 6.7 x 6.7 cm = volume: 267 mL. Simple cyst at the upper pole of the right kidney that simple cyst at the upper pole again demonstrated, measuring 4.1 x 3.9 x 3.5 cm no new lesions are present. Renal parenchyma otherwise normal. Bladder: Bladder wall thickness and irregularity again noted. No focal lesions present. Other: None. IMPRESSION: 1. No acute abnormality. 2. Stable bilateral simple renal cysts. 3. No obstruction. Electronically Signed   By: San Morelle M.D.   On: 02/06/2021 19:09   DG Knee Right Port  Result Date: 02/04/2021 CLINICAL DATA:  Distal right femur ORIF EXAM: PORTABLE RIGHT KNEE - 1-2 VIEW COMPARISON:  02/03/2021 FINDINGS: Interval ORIF of a distal femoral metaphyseal fracture with lateral sideplate and screw fixation construct. Femoral fracture alignment is near anatomic. Mildly displaced patellar fracture appears unchanged in alignment. 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 Expected postoperative changes within the overlying soft tissues. Extensive vascular calcification. IMPRESSION: Interval ORIF of a distal femoral metaphyseal fracture, in near anatomic alignment. Electronically Signed   By: Davina Poke D.O.   On: 02/04/2021 21:23   DG C-Arm 1-60 Min  Result Date: 02/04/2021 CLINICAL DATA:  75 year old male with ORIF of the right femur. EXAM: RIGHT FEMUR 2 VIEWS; DG C-ARM 1-60 MIN COMPARISON:  Right femur radiograph dated 02/03/2021. FINDINGS: Seven intraoperative fluoroscopic spot images provided. The total fluoroscopic time is 54 seconds with a total air kerma of 9.47 mGy. There has been interval placement of fixation sideplate screws along the distal femoral fracture. IMPRESSION: Fluoroscopic spot images of ORIF of the right femur. Electronically Signed   By: Anner Crete M.D.   On: 02/04/2021 21:21   DG FEMUR, MIN 2 VIEWS RIGHT  Result Date: 02/04/2021 CLINICAL DATA:  75 year old male with ORIF of the right femur. EXAM: RIGHT FEMUR 2 VIEWS; DG C-ARM 1-60 MIN COMPARISON:  Right femur radiograph dated 02/03/2021. FINDINGS: Seven intraoperative fluoroscopic spot images provided. The total fluoroscopic time is 54 seconds with a total air kerma of 9.47 mGy. There has been interval placement of fixation sideplate screws along the distal femoral fracture. IMPRESSION: Fluoroscopic spot images of ORIF of the right femur. Electronically Signed   By: Anner Crete M.D.   On: 02/04/2021 21:21      Subjective: Had some transient chest heaviness this morning, ECG showed Afib, no ST changes.  Symptoms resolved quickly, no O2 need, no pleuritic pain, no cough, no further dyspnea, no orthopnea.  Discharge Exam: Vitals:   02/08/21 0832 02/08/21 0859  BP: (!) 135/55 (!) 104/52  Pulse: 86 97  Resp: 18 18  Temp: 98.6 F (37 C) 98.8 F (37.1 C)  SpO2: 96% 97%   Vitals:   02/07/21 2115 02/08/21 0500 02/08/21 0832 02/08/21 0859  BP: (!) 111/52  (!) 135/55 (!) 104/52  Pulse: 67  86 97  Resp: 20  18 18   Temp: 98.2 F (36.8 C)  98.6 F (37 C) 98.8 F (37.1 C)  TempSrc:   Oral Oral  SpO2: 99%  96% 97%  Weight:  (!) 157.7 kg    Height:        General: Pt is alert, awake, not in acute distress Cardiovascular: RRR, nl S1-S2, no murmurs appreciated.   No LE edema.   Respiratory: Normal respiratory rate and rhythm.  CTAB without rales or wheezes. MSK: Incision clean dry and intact.  Normal expected swelling in right leg Abdominal: Abdomen soft and non-tender.  No distension or HSM.   Neuro/Psych: Strength symmetric in upper and lower extremities.  Judgment and insight appear normal.   The results of significant diagnostics from this hospitalization (including imaging, microbiology, ancillary and laboratory) are listed below for reference.      Microbiology: Recent Results (from the past 240 hour(s))  Resp Panel by RT-PCR (Flu A&B, Covid) Nasopharyngeal Swab     Status: None   Collection Time: 02/03/21 11:45 PM   Specimen: Nasopharyngeal Swab; Nasopharyngeal(NP) swabs in vial transport medium  Result Value Ref Range Status   SARS Coronavirus 2 by RT PCR NEGATIVE NEGATIVE Final    Comment: (NOTE) SARS-CoV-2 target nucleic acids are NOT DETECTED.  The SARS-CoV-2 RNA is generally detectable in upper respiratory specimens during the acute phase of infection. The lowest concentration of SARS-CoV-2 viral copies this assay can detect is 138 copies/mL. A negative result does not preclude  SARS-Cov-2 infection and should not be used as the sole basis for treatment or other patient management decisions. A negative result may occur with  improper specimen collection/handling, submission of specimen other than nasopharyngeal swab, presence of viral mutation(s) within the areas targeted by this assay, and inadequate number of viral copies(<138 copies/mL). A negative result must be combined with clinical observations, patient history, and epidemiological information. The expected result is Negative.  Fact Sheet for Patients:  EntrepreneurPulse.com.au  Fact Sheet for Healthcare Providers:  IncredibleEmployment.be  This test is no t yet approved or cleared by the [place] FDA and  has been authorized for detection and/or diagnosis of SARS-CoV-2 by FDA under an Emergency Use Authorization (EUA). This EUA will remain  in effect (meaning this test can be used) for the duration of the COVID-19 declaration under Section 564(b)(1) of the Act, 21 U.S.C.section 360bbb-3(b)(1), unless the authorization is terminated  or revoked sooner.       Influenza A by PCR NEGATIVE NEGATIVE Final   Influenza B by PCR NEGATIVE NEGATIVE Final    Comment: (NOTE) The Xpert Xpress SARS-CoV-2/FLU/RSV plus assay is  intended as an aid in the diagnosis of influenza from Nasopharyngeal swab specimens and should not be used as a sole basis for treatment. Nasal washings and aspirates are unacceptable for Xpert Xpress SARS-CoV-2/FLU/RSV testing.  Fact Sheet for Patients: EntrepreneurPulse.com.au  Fact Sheet for Healthcare Providers: IncredibleEmployment.be  This test is not yet approved or cleared by the [place] FDA and has been authorized for detection and/or diagnosis of SARS-CoV-2 by FDA under an Emergency Use Authorization (EUA). This EUA will remain in effect (meaning this test can be used) for the duration of the COVID-19 declaration under Section 564(b)(1) of the Act, 21 U.S.C. section 360bbb-3(b)(1), unless the authorization is terminated or revoked.  Performed at [facility], [place] [address] [place], [place] [address]      Labs: BNP (last 3 results) Recent Labs    05/25/20 1458  BNP 626.9*   Basic Metabolic Panel: Recent Labs  Lab 02/03/21 2355 02/04/21 0351 02/04/21 1829 02/05/21 0310 02/06/21 0425 02/07/21 0209 02/08/21 0331  NA 140 138  --  139 136 135  --   K 5.0 4.6  --  5.0 4.7 4.4  --   CL 105 100  --  103 102 102  --   CO2 29 29  --  33* 26 27  --   GLUCOSE 143* 172*  --  138* 132* 142*  --   BUN 18 18  --  27* 38* 45*  --   CREATININE 0.98 1.09 1.20 1.66* 1.74* 1.48* 1.16  CALCIUM 8.6* 9.6  --  8.8* 8.5* 8.5*  --   MG 1.5* 2.0  --   --  1.7  --   --   PHOS  --   --   --   --  3.5  --   --    Liver Function Tests: Recent Labs  Lab 02/03/21 2355 02/04/21 0351 02/06/21 0425  AST 28 21 18   ALT 16 19 7   ALKPHOS 77 95 74  BILITOT 1.0 1.2 0.7  PROT 5.2* 6.5 5.3*  ALBUMIN 2.5* 3.0* 2.6*   No results for input(s): LIPASE, AMYLASE in the last 168 hours. No results for input(s): AMMONIA in the last 168 hours. CBC: Recent Labs  Lab 02/03/21 2355 02/04/21 0351 02/04/21 1829 02/05/21 0310 02/06/21 0425  02/07/21 0209  WBC 9.2 11.9* 11.0* 8.8 6.7 5.4  NEUTROABS 7.3  --   --   --   --   --   HGB 11.5* 13.3 11.3* 9.8* 9.0* 8.6*  HCT 38.9* 43.4 36.9* 32.2* 31.0* 28.6*  MCV 96.8 96.0 95.8 96.7 97.5 96.9  PLT 127* 139* 139* 108* 88* 96*   Cardiac Enzymes: No results for input(s): CKTOTAL, CKMB, CKMBINDEX, TROPONINI in the last 168 hours. BNP: Invalid input(s): POCBNP CBG: Recent Labs  Lab 02/07/21 0600 02/07/21 1219 02/07/21 1809 02/07/21 2359 02/08/21 0607  GLUCAP 140* 126* 143* 119* 116*   D-Dimer No results for input(s): DDIMER in the last 72 hours. Hgb A1c No results for input(s): HGBA1C in the last 72 hours. Lipid Profile No results for input(s): CHOL, HDL, LDLCALC, TRIG, CHOLHDL, LDLDIRECT in the last 72 hours. Thyroid function studies No results for input(s): TSH, T4TOTAL, T3FREE, THYROIDAB in the last 72 hours.  Invalid input(s): FREET3 Anemia work up No results for input(s): VITAMINB12, FOLATE, FERRITIN, TIBC, IRON, RETICCTPCT in the last 72 hours. Urinalysis    Component Value Date/Time   COLORURINE YELLOW 02/04/2021 1037   APPEARANCEUR HAZY (A) 02/04/2021 1037   LABSPEC 1.011 02/04/2021 1037   PHURINE 6.0 02/04/2021 1037   GLUCOSEU NEGATIVE 02/04/2021 1037   HGBUR NEGATIVE 02/04/2021 1037   BILIRUBINUR NEGATIVE 02/04/2021 1037   KETONESUR NEGATIVE 02/04/2021 1037   PROTEINUR 100 (A) 02/04/2021 1037   NITRITE NEGATIVE 02/04/2021 1037   LEUKOCYTESUR LARGE (A) 02/04/2021 1037   Sepsis Labs Invalid input(s): PROCALCITONIN,  WBC,  LACTICIDVEN Microbiology Recent Results (from the past 240 hour(s))  Resp Panel by RT-PCR (Flu A&B, Covid) Nasopharyngeal Swab     Status: None   Collection Time: 02/03/21 11:45 PM   Specimen: Nasopharyngeal Swab; Nasopharyngeal(NP) swabs in vial transport medium  Result Value Ref Range Status   SARS Coronavirus 2 by RT PCR NEGATIVE NEGATIVE Final    Comment: (NOTE) SARS-CoV-2 target nucleic acids are NOT DETECTED.  The  SARS-CoV-2 RNA is generally detectable in upper respiratory specimens during the acute phase of infection. The lowest concentration of SARS-CoV-2 viral copies this assay can detect is 138 copies/mL. A negative result does not preclude SARS-Cov-2 infection and should not be used as the sole basis for treatment or other patient management decisions. A negative result may occur with  improper specimen collection/handling, submission of specimen other than nasopharyngeal swab, presence of viral mutation(s) within the areas targeted by this assay, and inadequate number of viral copies(<138 copies/mL). A negative result must be combined with clinical observations, patient history, and epidemiological information. The expected result is Negative.  Fact Sheet for Patients:  EntrepreneurPulse.com.au  Fact Sheet for Healthcare Providers:  IncredibleEmployment.be  This test is no t yet approved or cleared by the [place] FDA and  has been authorized for detection and/or diagnosis of SARS-CoV-2 by FDA under an Emergency Use Authorization (EUA). This EUA will remain  in effect (meaning this test can be used) for the duration of the COVID-19 declaration under Section 564(b)(1) of the Act, 21 U.S.C.section 360bbb-3(b)(1), unless the authorization is terminated  or revoked sooner.       Influenza A by PCR NEGATIVE NEGATIVE Final   Influenza B by PCR NEGATIVE NEGATIVE Final    Comment: (NOTE) The Xpert Xpress SARS-CoV-2/FLU/RSV plus assay is intended as an aid in the diagnosis of influenza from Nasopharyngeal swab specimens and should not be used as a sole basis for treatment. Nasal washings and aspirates are unacceptable for Xpert Xpress SARS-CoV-2/FLU/RSV testing.  Fact Sheet for  Patients: EntrepreneurPulse.com.au  Fact Sheet for Healthcare Providers: IncredibleEmployment.be  This test is not yet approved or  cleared by the [place] FDA and has been authorized for detection and/or diagnosis of SARS-CoV-2 by FDA under an Emergency Use Authorization (EUA). This EUA will remain in effect (meaning this test can be used) for the duration of the COVID-19 declaration under Section 564(b)(1) of the Act, 21 U.S.C. section 360bbb-3(b)(1), unless the authorization is terminated or revoked.  Performed at [facility], [place] [address], [place], [place] [address]      Time coordinating discharge: 25 minutes The [place] controlled substances registry was reviewed for this patient prior to filling the <5 days supply controlled substances script.    30 Day Unplanned Readmission Risk Score    Flowsheet Row ED to Hosp-Admission (Current) from 02/03/2021 in [place]  30 Day Unplanned Readmission Risk Score (%) 26.67 Filed at 02/08/2021 0801       This score is the patient's risk of an unplanned readmission within 30 days of being discharged (0 -100%). The score is based on dignosis, age, lab data, medications, orders, and past utilization.   Low:  0-14.9   Medium: 15-21.9   High: 22-29.9   Extreme: 30 and above            SIGNED:   Edwin Dada, MD  [facility] 02/08/2021, 11:19 AM

## 2021-02-08 NOTE — Care Management Important Message (Signed)
Important Message  Patient Details  Name: Brandon [name] MRN: [id number] Date of Birth: [birth year]   Medicare Important Message Given:  Yes - Important Message mailed due to current National Emergency   Verbal consent obtained due to current National Emergency  Relationship to patient: Self Contact Name: Eamon Call Date: 02/08/21  Time: 1128 Phone: [phone] Outcome: No Answer/Busy Important Message mailed to: Patient address on file   Delorse Lek 02/08/2021, 11:28 AM

## 2021-02-08 NOTE — Progress Notes (Signed)
Patient started complaining of SOB. During assessment following the complaint patient states feeling pressure in chest. Physician was notified and Dr Loleta Books came to bedside to see patient. Vitals and EKG was obtained. No other orders was given at that time.   Later patient still has complaints of SOB. Dr.Danford was notified again due to patient also having nausea and vomiting. Dr. Doristine Section in some tests to be ran on patient.   Order for IV Lasix was ordered. Patient had no IV, IV team was consulted and they were successful in obtaining peripheral IV. Lasix was given and bladder scan was performed on patient. Patient had >999 in bladder. Dr. Loleta Books was contacted and made aware. He instructed to In & Out Cath the patient. In & Out Cath was performed by myself and Manuela Schwartz, RN. Patient had an out put of >3,000. Dr. Loleta Books was contacted and made aware. Orders given to tell nightshift RN to bladder scan later that night and In & Out Cath if necessary. Patient tolerated everything well. Vitals remained stable. Patient stated feeling better and able to breathe better.  Passed on all information to nightshift nurse Adriana Simas, RN.

## 2021-02-08 NOTE — Progress Notes (Signed)
Orthopaedic Trauma Progress Note  SUBJECTIVE: Doing okay this morning, no specific complaints. Continues working on increasing mobility with therapies. I was contacted yesterday afternoon by PT about bleeding from incision site. Pressure was applied to the area and patient remained stable. Patient states he removed the dressing last night and has not had any issues since then.   OBJECTIVE:  Vitals:   02/07/21 1220 02/07/21 2115  BP: (!) 106/58 (!) 111/52  Pulse: 71 67  Resp: 19 20  Temp: 98.2 F (36.8 C) 98.2 F (36.8 C)  SpO2: 96% 99%    General: Resting in bed,  NAD Respiratory: No increased work of breathing.  RLE: Incision as below. Do not appreciate any areas of dehiscence. No active drainage. Does have some warmth to that area compared to contralateral side. No significant tenderness throughout leg. Ankle DF/PF intact. Compartments soft and compressible. Foot warm and well perfused.     IMAGING: Stable post op imaging.   LABS:  Results for orders placed or performed during the hospital encounter of 02/03/21 (from the past 24 hour(s))  Glucose, capillary     Status: Abnormal   Collection Time: 02/07/21 12:19 PM  Result Value Ref Range   Glucose-Capillary 126 (H) 70 - 99 mg/dL  Glucose, capillary     Status: Abnormal   Collection Time: 02/07/21  6:09 PM  Result Value Ref Range   Glucose-Capillary 143 (H) 70 - 99 mg/dL  Glucose, capillary     Status: Abnormal   Collection Time: 02/07/21 11:59 PM  Result Value Ref Range   Glucose-Capillary 119 (H) 70 - 99 mg/dL  Creatinine, serum     Status: None   Collection Time: 02/08/21  3:31 AM  Result Value Ref Range   Creatinine, Ser 1.16 0.61 - 1.24 mg/dL   GFR, Estimated >60 >60 mL/min  Glucose, capillary     Status: Abnormal   Collection Time: 02/08/21  6:07 AM  Result Value Ref Range   Glucose-Capillary 116 (H) 70 - 99 mg/dL    ASSESSMENT: Brandon [name] is a 75 y.o. male, 4 Days Post-Op  s/p ORIF RIGHT DISTAL FEMUR  FRACTURE  CV/Blood loss: Acute blood loss anemia, Hgb 8.6 on 02/07/21. Hemodynamically stable  PLAN: Weightbearing: TDWB RLE Incisional and dressing care:  Ok to leave open to air Showering: Ok to begin showering with assistance if no drainage from incision Orthopedic device(s): None  Pain management:  1. Tylenol 325-650 mg q 6 hours PRN 2. Robaxin 500 mg q 6 hours PRN 3. Norco 5-325 OR 7.5-325 mg q 4 hours PRN 4. Morphine 0.5-1 mg q 2 hours PRN VTE prophylaxis: Lovenox, SCDs ID:  Ancef 2gm post op completed Foley/Lines:  No foley, KVO IVFs Impediments to Fracture Healing: Vit D level 38, no supplementation needed Dispo: Therapies as tolerated, PT/OT recommending SNF. Bed offer at [facility], insurance authorization pending. Okay for discharge from ortho standpoint once cleared by medicine team and therapies. Have signed and placed rx for pain meds and Lovenox in chart  Follow - up plan: 2 weeks after d/c  Contact information:  Katha Hamming MD, Patrecia Pace PA-C. After hours and holidays please check [email or web address] for group call information for [facility]   Sharnetta Gielow A. Ricci Barker, PA-C [phone] (office) [email or web address]

## 2021-02-08 NOTE — Plan of Care (Signed)
  Problem: Education: Goal: Knowledge of General Education information will improve Description: Including pain rating scale, medication(s)/side effects and non-pharmacologic comfort measures Outcome: Progressing   Problem: Health Behavior/Discharge Planning: Goal: Ability to manage health-related needs will improve Outcome: Progressing   Problem: Nutrition: Goal: Adequate nutrition will be maintained Outcome: Progressing   Problem: Coping: Goal: Level of anxiety will decrease Outcome: Progressing   Problem: Elimination: Goal: Will not experience complications related to bowel motility Outcome: Progressing   Problem: Pain Managment: Goal: General experience of comfort will improve Outcome: Progressing   Problem: Safety: Goal: Ability to remain free from injury will improve Outcome: Progressing   Problem: Skin Integrity: Goal: Risk for impaired skin integrity will decrease Outcome: Progressing   

## 2021-02-08 NOTE — Progress Notes (Signed)
OT Cancellation Note  Patient Details Name: Brandon [name] MRN: [id number] DOB: Mar 10, 1946   Cancelled Treatment:    Reason Eval/Treat Not Completed: Medical issues which prohibited therapy. Pt with chest pains, increased HR. OT will follow up next available time as appropriate  Britt Bottom 02/08/2021, 3:54 PM

## 2021-02-09 LAB — GLUCOSE, CAPILLARY
Glucose-Capillary: 127 mg/dL — ABNORMAL HIGH (ref 70–99)
Glucose-Capillary: 120 mg/dL — ABNORMAL HIGH (ref 70–99)
Glucose-Capillary: 145 mg/dL — ABNORMAL HIGH (ref 70–99)
Glucose-Capillary: 188 mg/dL — ABNORMAL HIGH (ref 70–99)

## 2021-02-09 LAB — BASIC METABOLIC PANEL
Anion gap: 9 (ref 5–15)
BUN: 35 mg/dL — ABNORMAL HIGH (ref 8–23)
CO2: 27 mmol/L (ref 22–32)
Calcium: 9.2 mg/dL (ref 8.9–10.3)
Chloride: 102 mmol/L (ref 98–111)
Creatinine, Ser: 0.95 mg/dL (ref 0.61–1.24)
GFR, Estimated: >60 mL/min (ref >60)
Glucose, Bld: 136 mg/dL — ABNORMAL HIGH (ref 70–99)
Potassium: 4.3 mmol/L (ref 3.5–5.1)
Sodium: 138 mmol/L (ref 135–145)

## 2021-02-09 MED ORDER — CHLORHEXIDINE GLUCONATE CLOTH 2 % EX PADS
6.0000 | MEDICATED_PAD | Freq: Every day | CUTANEOUS | Status: DC
  Administered 2021-02-09 – 2021-02-11 (×3): 6 via TOPICAL

## 2021-02-09 NOTE — Progress Notes (Signed)
Foley catheter was placed. Pt educated.

## 2021-02-09 NOTE — Progress Notes (Signed)
PROGRESS NOTE  Brandon [name] VHQ:[id number] DOB: 1946-02-04 DOA: 02/03/2021 PCP: Helane Rima, MD  HPI/Recap of past 24 hours:  Brandon [name] is a 75 y.o. M with pAF not on AC due to recurrent GIB, hx alcohol cirrhosis compensated, and DM, obesity and anemia who presented with leg pain after a fall.   Mechanical fall at home, in the ER, found to have an acute distal right femoral fracture, orthopedic surgery was consulted and he was admitted to the hospital.  February 09, 2021:   Patient seen and examined at bedside. Patient stated that there was nothing new he said that he was same as yesterday that this is the most comfortable he has been. Nurse reports that patient is retaining urine he has had several in and out Foley catheterization.  Also overnight he had unresponsiveness and a repeat response team was called. Also noted that he is having apneic episodes with desaturation of his oxygen to the 80s while sleeping and goes up to 90s on room air He was also already for discharge yesterday but it was canceled when he developed recurrent shortness of breath and the chest x-ray was obtained showed increasing bilateral infiltrate and there is a suspect of CHF exacerbation so his fluid was discontinued and he was started on IV Lasix   Assessment/Plan: Active Problems:   DM (diabetes mellitus), type 2 ([place])   Atrial fibrillation ([place])   Hypomagnesemia   Fall at home, initial encounter   Cirrhosis of liver ([place])   Anemia   Disp supracondyl fx w/o intracondylar extension of right distal femur ([place]) #1 acute kidney injury which has resolved IV fluid was given and his diuretic were held but due to his shortness of breath it was restarted yesterday  2.  Paroxysmal atrial fibrillation not on anticoagulation due to history of liver cirrhosis and prior GI bleed  3.  Type 2 diabetes mellitus most recent A1c 6.9 continue current management  4.  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  Obesity class III BMI greater than 40 Patient is well compensated  6.  Urinary retention with multiple In-N-Out cath inability to urinate.  We will start him on Foley catheter insertion  Code Status: DNR  Severity of Illness: The appropriate patient status for this patient is INPATIENT. Inpatient status is judged to be reasonable and necessary in order to provide the required intensity of service to ensure the patient's safety. The patient's presenting symptoms, physical exam findings, and initial radiographic and laboratory data in the context of their chronic comorbidities is felt to place them at high risk for further clinical deterioration. Furthermore, it is not anticipated that the patient will be medically stable for discharge from the hospital within 2 midnights of admission. The following factors support the patient status of inpatient.   " Urinary retention desaturation and A. fib  * I certify that at the point of admission it is my clinical judgment that the patient will require inpatient hospital care spanning beyond 2 midnights from the point of admission due to high intensity of service, high risk for further deterioration and high frequency of surveillance required.*   Family Communication: None at bedside  Disposition Plan: For SNF   Consultants: ORTHOPEDIC    Procedures:   Antimicrobials:   DVT prophylaxis:  LEVONOX   Objective: Vitals:   02/08/21 2107 02/09/21 0021 02/09/21 0040 02/09/21 0736  BP: (!) 148/80 137/75 111/72 (!) 154/65  Pulse: 85 84  85  Resp: 18 20  17   Temp: 98.4 F (36.9 C) 98.3 F (36.8 C)  98.4 F (36.9 C)  TempSrc: Oral Oral  Oral  SpO2: 91%   99%  Weight:      Height:        Intake/Output Summary (Last 24 hours) at 02/09/2021 0900 Last data filed at 02/09/2021 0500 Gross per 24 hour  Intake --  Output 2050 ml  Net -2050 ml   Filed Weights   02/06/21 0500 02/07/21 0500 02/08/21 0500  Weight: (!) 157.1 kg (!)  155.9 kg (!) 157.7 kg   Body mass index is 56.11 kg/m.  Exam:  General: 75 y.o. year-old male well developed well nourished in no acute distress.  Alert and oriented x3.  Obese++ Cardiovascular: Regular rate and rhythm with no rubs or gallops.  No thyromegaly or JVD noted.   Respiratory: Clear to auscultation with no wheezes or rales. Good inspiratory effort. Abdomen: Soft nontender nondistended with normal bowel sounds x4 quadrants. Musculoskeletal: No lower extremity edema. 2/4 pulses in all 4 extremities. Skin: No ulcerative lesions noted or rashes, Psychiatry: Mood is appropriate for condition and setting    Data Reviewed: CBC: Recent Labs  Lab 02/03/21 2355 02/04/21 0351 02/04/21 1829 02/05/21 0310 02/06/21 0425 02/07/21 0209  WBC 9.2 11.9* 11.0* 8.8 6.7 5.4  NEUTROABS 7.3  --   --   --   --   --   HGB 11.5* 13.3 11.3* 9.8* 9.0* 8.6*  HCT 38.9* 43.4 36.9* 32.2* 31.0* 28.6*  MCV 96.8 96.0 95.8 96.7 97.5 96.9  PLT 127* 139* 139* 108* 88* 96*   Basic Metabolic Panel: Recent Labs  Lab 02/03/21 2355 02/04/21 0351 02/04/21 1829 02/05/21 0310 02/06/21 0425 02/07/21 0209 02/08/21 0331 02/09/21 0205  NA 140 138  --  139 136 135  --  138  K 5.0 4.6  --  5.0 4.7 4.4  --  4.3  CL 105 100  --  103 102 102  --  102  CO2 29 29  --  33* 26 27  --  27  GLUCOSE 143* 172*  --  138* 132* 142*  --  136*  BUN 18 18  --  27* 38* 45*  --  35*  CREATININE 0.98 1.09   < > 1.66* 1.74* 1.48* 1.16 0.95  CALCIUM 8.6* 9.6  --  8.8* 8.5* 8.5*  --  9.2  MG 1.5* 2.0  --   --  1.7  --   --   --   PHOS  --   --   --   --  3.5  --   --   --    < > = values in this interval not displayed.   GFR: Estimated Creatinine Clearance: 97.8 mL/min (by C-G formula based on SCr of 0.95 mg/dL). Liver Function Tests: Recent Labs  Lab 02/03/21 2355 02/04/21 0351 02/06/21 0425  AST 28 21 18   ALT 16 19 7   ALKPHOS 77 95 74  BILITOT 1.0 1.2 0.7  PROT 5.2* 6.5 5.3*  ALBUMIN 2.5* 3.0* 2.6*   No  results for input(s): LIPASE, AMYLASE in the last 168 hours. No results for input(s): AMMONIA in the last 168 hours. Coagulation Profile: Recent Labs  Lab 02/03/21 2355 02/04/21 0351 02/06/21 0425  INR 1.1 1.1 1.3*   Cardiac Enzymes: No results for input(s): CKTOTAL, CKMB, CKMBINDEX, TROPONINI in the last 168 hours. BNP (last 3 results) No results for input(s): PROBNP in the last 8760 hours.  HbA1C: No results for input(s): HGBA1C in the last 72 hours. CBG: Recent Labs  Lab 02/07/21 2359 02/08/21 0607 02/08/21 1128 02/08/21 1737 02/09/21 0622  GLUCAP 119* 116* 144* 144* 127*   Lipid Profile: No results for input(s): CHOL, HDL, LDLCALC, TRIG, CHOLHDL, LDLDIRECT in the last 72 hours. Thyroid Function Tests: No results for input(s): TSH, T4TOTAL, FREET4, T3FREE, THYROIDAB in the last 72 hours. Anemia Panel: No results for input(s): VITAMINB12, FOLATE, FERRITIN, TIBC, IRON, RETICCTPCT in the last 72 hours. Urine analysis:    Component Value Date/Time   COLORURINE YELLOW 02/04/2021 1037   APPEARANCEUR HAZY (A) 02/04/2021 1037   LABSPEC 1.011 02/04/2021 1037   PHURINE 6.0 02/04/2021 1037   GLUCOSEU NEGATIVE 02/04/2021 1037   HGBUR NEGATIVE 02/04/2021 1037   BILIRUBINUR NEGATIVE 02/04/2021 1037   KETONESUR NEGATIVE 02/04/2021 1037   PROTEINUR 100 (A) 02/04/2021 1037   NITRITE NEGATIVE 02/04/2021 1037   LEUKOCYTESUR LARGE (A) 02/04/2021 1037   Sepsis Labs: @LABRCNTIP (procalcitonin:4,lacticidven:4)  ) Recent Results (from the past 240 hour(s))  Resp Panel by RT-PCR (Flu A&B, Covid) Nasopharyngeal Swab     Status: None   Collection Time: 02/03/21 11:45 PM   Specimen: Nasopharyngeal Swab; Nasopharyngeal(NP) swabs in vial transport medium  Result Value Ref Range Status   SARS Coronavirus 2 by RT PCR NEGATIVE NEGATIVE Final    Comment: (NOTE) SARS-CoV-2 target nucleic acids are NOT DETECTED.  The SARS-CoV-2 RNA is generally detectable in upper respiratory specimens  during the acute phase of infection. The lowest concentration of SARS-CoV-2 viral copies this assay can detect is 138 copies/mL. A negative result does not preclude SARS-Cov-2 infection and should not be used as the sole basis for treatment or other patient management decisions. A negative result may occur with  improper specimen collection/handling, submission of specimen other than nasopharyngeal swab, presence of viral mutation(s) within the areas targeted by this assay, and inadequate number of viral copies(<138 copies/mL). A negative result must be combined with clinical observations, patient history, and epidemiological information. The expected result is Negative.  Fact Sheet for Patients:  EntrepreneurPulse.com.au  Fact Sheet for Healthcare Providers:  IncredibleEmployment.be  This test is no t yet approved or cleared by the [place] FDA and  has been authorized for detection and/or diagnosis of SARS-CoV-2 by FDA under an Emergency Use Authorization (EUA). This EUA will remain  in effect (meaning this test can be used) for the duration of the COVID-19 declaration under Section 564(b)(1) of the Act, 21 U.S.C.section 360bbb-3(b)(1), unless the authorization is terminated  or revoked sooner.       Influenza A by PCR NEGATIVE NEGATIVE Final   Influenza B by PCR NEGATIVE NEGATIVE Final    Comment: (NOTE) The Xpert Xpress SARS-CoV-2/FLU/RSV plus assay is intended as an aid in the diagnosis of influenza from Nasopharyngeal swab specimens and should not be used as a sole basis for treatment. Nasal washings and aspirates are unacceptable for Xpert Xpress SARS-CoV-2/FLU/RSV testing.  Fact Sheet for Patients: EntrepreneurPulse.com.au  Fact Sheet for Healthcare Providers: IncredibleEmployment.be  This test is not yet approved or cleared by the [place] FDA and has been authorized for detection  and/or diagnosis of SARS-CoV-2 by FDA under an Emergency Use Authorization (EUA). This EUA will remain in effect (meaning this test can be used) for the duration of the COVID-19 declaration under Section 564(b)(1) of the Act, 21 U.S.C. section 360bbb-3(b)(1), unless the authorization is terminated or revoked.  Performed at [facility], [place],  [place] [address]       Studies: DG CHEST PORT 1 VIEW  Result Date: 02/08/2021 CLINICAL DATA:  Dyspnea EXAM: PORTABLE CHEST 1 VIEW COMPARISON:  02/03/2021 FINDINGS: Post CABG changes. Stable cardiomegaly. Atherosclerotic calcification of the aortic knob. Diffuse interstitial opacities throughout both lungs. No appreciable pleural fluid collection. No pneumothorax. IMPRESSION: Diffuse interstitial opacities throughout both lungs suggesting pulmonary edema versus atypical/viral infection. Findings slightly progressed from previous radiograph. Electronically Signed   By: Davina Poke D.O.   On: 02/08/2021 12:16    Scheduled Meds:  carvedilol  25 mg Oral BID WC   docusate sodium  100 mg Oral BID   enoxaparin (LOVENOX) injection  70 mg Subcutaneous Q24H   finasteride  5 mg Oral Daily   insulin aspart  0-6 Units Subcutaneous Q6H   polyethylene glycol  17 g Oral Daily   pravastatin  10 mg Oral QHS   tamsulosin  0.4 mg Oral QHS    Continuous Infusions:   LOS: 6 days     Cristal Deer, MD [facility]  To reach me or the doctor on call, go to: [email or web address] Password [email or web address]  02/09/2021, 9:00 AM

## 2021-02-09 NOTE — Progress Notes (Signed)
Bladder scan showed >350. In and out was done. 900 ml was drained. MD messaged to ask about foley placement as this was the 3rd time being straight catheterized in 24 hours. MD placed orders for foley.

## 2021-02-09 NOTE — Plan of Care (Signed)
  Problem: Education: Goal: Knowledge of General Education information will improve Description: Including pain rating scale, medication(s)/side effects and non-pharmacologic comfort measures Outcome: Progressing   Problem: Nutrition: Goal: Adequate nutrition will be maintained Outcome: Progressing   Problem: Pain Managment: Goal: General experience of comfort will improve Outcome: Progressing   Problem: Health Behavior/Discharge Planning: Goal: Ability to manage health-related needs will improve Outcome: Not Progressing   Problem: Clinical Measurements: Goal: Ability to maintain clinical measurements within normal limits will improve Outcome: Not Progressing   Problem: Activity: Goal: Risk for activity intolerance will decrease Outcome: Not Progressing   Problem: Elimination: Goal: Will not experience complications related to bowel motility Outcome: Not Progressing Goal: Will not experience complications related to urinary retention Outcome: Not Progressing

## 2021-02-09 NOTE — Progress Notes (Signed)
Pt bladder scanned >540 ml. In and out done with 1600 output. Pt also c/o chest discomfort with shortness of breath. Pt noted to have apneic episodes while sleeping with oxygen drops to low 80's intermittently and goes up to 90's on room air.   Rapid RN called and came to assess the pt. EKG was done showing afib with rate of 77. Pt also had similar symptoms early today and received IV lasix. NP Ouma notified and informed RN to continue to monitor pt for worsening symptoms and followed labs in AM.

## 2021-02-09 NOTE — Progress Notes (Signed)
Physical Therapy Treatment Patient Details Name: Brandon [name] MRN: [id number] DOB: Mar 08, 1946 Today's Date: 02/09/2021    History of Present Illness 75 yo male admitted 02/03/21 with onset of mechanical fall from a trip at home, came down on R knee. s/p ORIF R distal femur 02/04/21 and R patella was noted to be undisplaced.  Permitted ROM to R knee and TDWB.  02/07/21 small dihiscence of promximal aspect of incision/pressure dressing applied by RN. PMHx: OA, PAF, GI bleed, chronic back pain, cirrhosis, chronic anemia, DM, thrombocytopenia    PT Comments    Pt received in supine, agreeable to therapy session, with good participation and fair tolerance for mobility, limited due RLE and back pain. Pt unable to tolerate long sit in bed due to pain so instead focus on supine UE/LE strengthening exercises, pressure relief strategies and encouraged edema reduction techniques (RLE elevated). Pt continues to benefit from PT services to progress toward functional mobility goals. Continue to recommend SNF.   Follow Up Recommendations  SNF     Equipment Recommendations  None recommended by PT    Recommendations for Other Services       Precautions / Restrictions Precautions Precautions: Fall Precaution Comments: monitor vitals Restrictions Weight Bearing Restrictions: Yes RLE Weight Bearing: Touchdown weight bearing Other Position/Activity Restrictions: significant R foot/ankle edema    Mobility  Bed Mobility Overal bed mobility: Needs Assistance Bed Mobility: Rolling Rolling: Mod assist     Sit to supine:  (+3 for back to bed.)   General bed mobility comments: with pt HOB fully elevated and bed in chair position, pt able to perform partial long sit x3 reps using B bed rails to pull trunk forward and modA from therapist, unable to reach fully upright or maintain >5-10 seconds due to reported chronic back back ("tailbone to shoulders") and R hip pain with increased flexion; pt able to roll  with +68modA and cues for cross-body reaching to rail, did need +27maxA for lateral hip translation in supine as pt unable to perform bridging 2/2 body habitus/pain.    Transfers                 General transfer comment: Pt will need mechanical lift to move OOB to recliner.  Ambulation/Gait                 Stairs             Wheelchair Mobility    Modified Rankin (Stroke Patients Only)       Balance Overall balance assessment: History of Falls;Needs assistance Sitting-balance support: Feet supported;Bilateral upper extremity supported Sitting balance-Leahy Scale: Zero Sitting balance - Comments: unable to maintain long sitting even with mod/maxA (hip/back pain limiting)       Standing balance comment: pt unable                            Cognition Arousal/Alertness: Awake/alert Behavior During Therapy: WFL for tasks assessed/performed Overall Cognitive Status: Within Functional Limits for tasks assessed                                 General Comments: pt anxious to attempt mobility, good following of most 1 and 2-step commands      Exercises General Exercises - Lower Extremity Ankle Circles/Pumps: AROM;Both;20 reps;Supine Quad Sets: AROM;Both;10 reps;Supine Short Arc Quad: AROM;Both;10 reps;Other (comment) (bed in chair posture) Heel Slides: AROM;AAROM;Both;10  reps;Supine (AA on RLE) Hip ABduction/ADduction: AAROM;10 reps;Both;Supine Straight Leg Raises: AAROM;10 reps;Left (unable to tolerate on RLE) Other Exercises Other Exercises: hip ADduction pillow squeezes x10 reps bilaterally Other Exercises: IS x10 reps (~1066mL) Other Exercises: supine BUE A/AAROM: shoulder flexion, shoulder abduction (A/AAROM limited on RUE 2/2 arthritis/previous injury with notable crepitus, pt able to AROM on LUE)x 10 reps ea    General Comments General comments (skin integrity, edema, etc.): heavily encouraged pressure relief strategies q2H  in supine, pt and RN notified and RLE elevated for edema relief; deferred OOB due to pt pain and RN needing to insert foley cath at end of session.      Pertinent Vitals/Pain Pain Assessment: Faces Faces Pain Scale: Hurts even more Pain Location: R knee, hip and ankle with movement Pain Descriptors / Indicators: Grimacing;Guarding;Moaning Pain Intervention(s): Limited activity within patient's tolerance;Monitored during session;Repositioned;Other (comment) (pt deferred ice due to discomfort from chronic arthritis, agreeable to RLE slight elevation with heel floated)    PT Goals (current goals can now be found in the care plan section) Acute Rehab PT Goals Patient Stated Goal: To get back home again PT Goal Formulation: With patient Time For Goal Achievement: 02/19/21 Potential to Achieve Goals: Fair Progress towards PT goals: Not progressing toward goals - comment (slow progress, pain limiting)    Frequency    Min 3X/week      PT Plan Current plan remains appropriate    AM-PAC PT "6 Clicks" Mobility   Outcome Measure  Help needed turning from your back to your side while in a flat bed without using bedrails?: A Lot Help needed moving from lying on your back to sitting on the side of a flat bed without using bedrails?: A Lot Help needed moving to and from a bed to a chair (including a wheelchair)?: Total Help needed standing up from a chair using your arms (e.g., wheelchair or bedside chair)?: Total Help needed to walk in hospital room?: Total Help needed climbing 3-5 steps with a railing? : Total 6 Click Score: 8    End of Session Equipment Utilized During Treatment: Oxygen Activity Tolerance: Patient limited by fatigue;Patient limited by pain Patient left: in bed;with call bell/phone within reach;with bed alarm set;with nursing/sitter in room;Other (comment) (semi-sidelying toward L side, NT/RN asked to help him reposition in ~2hrs, RLE elevated) Nurse Communication:  Mobility status PT Visit Diagnosis: Muscle weakness (generalized) (M62.81);History of falling (Z91.81);Pain;Difficulty in walking, not elsewhere classified (R26.2) Pain - Right/Left: Right Pain - part of body: Leg;Knee;Hip     Time: 1610-9604 PT Time Calculation (min) (ACUTE ONLY): 28 min  Charges:  $Therapeutic Exercise: 8-22 mins $Therapeutic Activity: 8-22 mins                     Kalen Neidert P., PTA Acute Rehabilitation Services Pager: [phone] Office: [place] 02/09/2021, 5:40 PM

## 2021-02-09 NOTE — Significant Event (Signed)
Rapid Response Event Note   Reason for Call :  SOB/chest pressure  Initial Focused Assessment:  Pt lying in bed with eyes closed. He is easily arousable. He is alert and oriented, c/o SOB and chest pressure across top part of chest. Lungs are clear and diminished. Skin warm and dry. Pt goes back to sleep with no stimulation. He is having apneic episodes while sleeping, accompanied by his SpO2 dropping to mid 80s and then going back up to 94% on RA. Pt denies any h/o OSA.   T-98.3, HR-84, BP-137/75, RR-20, SpO2-94% on RA.    Interventions:  EKG-Afib  Plan of Care:  Pt had a similar episode yesterday AM for which he had a PCXR showing "Diffuse interstitial opacities throughout both lungs suggesting pulmonary edema versus atypical/viral infection." He was given 40mg  Lasix IV at this time.  Alert PCP of happenings and await response/orders. Call RRT if further assistance needed.   Event Summary:   MD Notified: Bedside RN to notified PCP.  Call Weld  Dillard Essex, RN

## 2021-02-10 LAB — CBC
HCT: 30.4 % — ABNORMAL LOW (ref 39.0–52.0)
Hemoglobin: 9 g/dL — ABNORMAL LOW (ref 13.0–17.0)
MCH: 28.7 pg (ref 26.0–34.0)
MCHC: 29.6 g/dL — ABNORMAL LOW (ref 30.0–36.0)
MCV: 96.8 fL (ref 80.0–100.0)
Platelets: 158 10*3/uL (ref 150–400)
RBC: 3.14 MIL/uL — ABNORMAL LOW (ref 4.22–5.81)
RDW: 14.2 % (ref 11.5–15.5)
WBC: 5.6 10*3/uL (ref 4.0–10.5)
nRBC: 0.5 % — ABNORMAL HIGH (ref 0.0–0.2)

## 2021-02-10 LAB — BASIC METABOLIC PANEL
Anion gap: 4 — ABNORMAL LOW (ref 5–15)
BUN: 24 mg/dL — ABNORMAL HIGH (ref 8–23)
CO2: 31 mmol/L (ref 22–32)
Calcium: 9.2 mg/dL (ref 8.9–10.3)
Chloride: 102 mmol/L (ref 98–111)
Creatinine, Ser: 0.78 mg/dL (ref 0.61–1.24)
GFR, Estimated: >60 mL/min (ref >60)
Glucose, Bld: 127 mg/dL — ABNORMAL HIGH (ref 70–99)
Potassium: 4.1 mmol/L (ref 3.5–5.1)
Sodium: 137 mmol/L (ref 135–145)

## 2021-02-10 LAB — GLUCOSE, CAPILLARY
Glucose-Capillary: 119 mg/dL — ABNORMAL HIGH (ref 70–99)
Glucose-Capillary: 142 mg/dL — ABNORMAL HIGH (ref 70–99)
Glucose-Capillary: 183 mg/dL — ABNORMAL HIGH (ref 70–99)
Glucose-Capillary: 216 mg/dL — ABNORMAL HIGH (ref 70–99)

## 2021-02-10 NOTE — Plan of Care (Signed)
  Problem: Education: Goal: Knowledge of General Education information will improve Description: Including pain rating scale, medication(s)/side effects and non-pharmacologic comfort measures Outcome: Progressing   Problem: Health Behavior/Discharge Planning: Goal: Ability to manage health-related needs will improve Outcome: Progressing   Problem: Nutrition: Goal: Adequate nutrition will be maintained Outcome: Progressing   Problem: Pain Managment: Goal: General experience of comfort will improve Outcome: Progressing   Problem: Activity: Goal: Risk for activity intolerance will decrease Outcome: Not Progressing

## 2021-02-10 NOTE — Progress Notes (Signed)
PROGRESS NOTE  Lambros Cerro ZMO:[id number] DOB: Jul 23, 1946 DOA: 02/03/2021 PCP: Helane Rima, MD  HPI/Recap of past 24 hours:  [name] is a 75 y.o. M with pAF not on AC due to recurrent GIB, hx alcohol cirrhosis compensated, and DM, obesity and anemia who presented with leg pain after a fall.   Mechanical fall at home, in the ER, found to have an acute distal right femoral fracture, orthopedic surgery was consulted and he was admitted to the hospital.  February 09, 2021:   Patient seen and examined at bedside. Patient stated that there was nothing new he said that he was same as yesterday that this is the most comfortable he has been. Nurse reports that patient is retaining urine he has had several in and out Foley catheterization.  Also overnight he had unresponsiveness and a repeat response team was called. Also noted that he is having apneic episodes with desaturation of his oxygen to the 80s while sleeping and goes up to 90s on room air He was also already for discharge yesterday but it was canceled when he developed recurrent shortness of breath and the chest x-ray was obtained showed increasing bilateral infiltrate and there is a suspect of CHF exacerbation so his fluid was discontinued and he was started on IV Lasix  February 11, 2019 2020: Patient seen and examined at bedside he is doing much brighter spirits today he participated in physical therapy even though he was not able to get out of bed and was in bed physical therapy.  I also spoke with his son [name] and explained what is going on and updated him.  He noted that patient had a similar urinary retention when he was in the hospital last time and once he was discharged and [facility] he was able to void on his own apparently he is not able to void because he is recumbent in the bed.  Assessment/Plan: Active Problems:   DM (diabetes mellitus), type 2 ([place])   Atrial fibrillation (HCC)   Hypomagnesemia   Fall at home, initial  encounter   Cirrhosis of liver ([place])   Anemia   Disp supracondyl fx w/o intracondylar extension of right distal femur ([place]) #1 acute kidney injury which has resolved IV fluid was given and his diuretic were held but due to his shortness of breath it was restarted yesterday  2.  Paroxysmal atrial fibrillation not on anticoagulation due to history of liver cirrhosis and prior GI bleed  3.  Type 2 diabetes mellitus most recent A1c 6.9 continue current management  4.  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  Obesity class III BMI greater than 40 Patient is well compensated  6.  Urinary retention with multiple In-N-Out cath inability to urinate.  We will start him on Foley catheter insertion  Code Status: DNR  Severity of Illness: The appropriate patient status for this patient is INPATIENT. Inpatient status is judged to be reasonable and necessary in order to provide the required intensity of service to ensure the patient's safety. The patient's presenting symptoms, physical exam findings, and initial radiographic and laboratory data in the context of their chronic comorbidities is felt to place them at high risk for further clinical deterioration. Furthermore, it is not anticipated that the patient will be medically stable for discharge from the hospital within 2 midnights of admission. The following factors support the patient status of inpatient.   " Urinary retention desaturation and A. fib  *  I certify that at the point of admission it is my clinical judgment that the patient will require inpatient hospital care spanning beyond 2 midnights from the point of admission due to high intensity of service, high risk for further deterioration and high frequency of surveillance required.*   Family Communication: Son [name] over the phone  Disposition Plan: For SNF   Consultants: ORTHOPEDIC    Procedures:   Antimicrobials:   DVT prophylaxis:   LEVONOX   Objective: Vitals:   02/09/21 2035 02/10/21 0500 02/10/21 0735 02/10/21 1510  BP: 130/75  (!) 141/83 116/60  Pulse: 74  87 82  Resp: 18     Temp: 97.6 F (36.4 C)  98 F (36.7 C) 98.1 F (36.7 C)  TempSrc: Oral  Oral Oral  SpO2: 99%  97% 98%  Weight:  (!) 153 kg    Height:        Intake/Output Summary (Last 24 hours) at 02/10/2021 1841 Last data filed at 02/10/2021 0500 Gross per 24 hour  Intake --  Output 700 ml  Net -700 ml    Filed Weights   02/07/21 0500 02/08/21 0500 02/10/21 0500  Weight: (!) 155.9 kg (!) 157.7 kg (!) 153 kg   Body mass index is 54.44 kg/m.  Exam:  General: 75 y.o. year-old male well developed well nourished in no acute distress.  Alert and oriented x3.  Obese++ Cardiovascular: Regular rate and rhythm with no rubs or gallops.  No thyromegaly or JVD noted.   Respiratory: Clear to auscultation with no wheezes or rales. Good inspiratory effort. Abdomen: Soft nontender nondistended with normal bowel sounds x4 quadrants. Musculoskeletal: No lower extremity edema. 2/4 pulses in all 4 extremities. Skin: No ulcerative lesions noted or rashes, Psychiatry: Mood is appropriate for condition and setting    Data Reviewed: CBC: Recent Labs  Lab 02/03/21 2355 02/04/21 0351 02/04/21 1829 02/05/21 0310 02/06/21 0425 02/07/21 0209 02/10/21 0233  WBC 9.2   < > 11.0* 8.8 6.7 5.4 5.6  NEUTROABS 7.3  --   --   --   --   --   --   HGB 11.5*   < > 11.3* 9.8* 9.0* 8.6* 9.0*  HCT 38.9*   < > 36.9* 32.2* 31.0* 28.6* 30.4*  MCV 96.8   < > 95.8 96.7 97.5 96.9 96.8  PLT 127*   < > 139* 108* 88* 96* 158   < > = values in this interval not displayed.    Basic Metabolic Panel: Recent Labs  Lab 02/03/21 2355 02/04/21 0351 02/04/21 1829 02/05/21 0310 02/06/21 0425 02/07/21 0209 02/08/21 0331 02/09/21 0205 02/10/21 0233  NA 140 138  --  139 136 135  --  138 137  K 5.0 4.6  --  5.0 4.7 4.4  --  4.3 4.1  CL 105 100  --  103 102 102  --  102 102   CO2 29 29  --  33* 26 27  --  27 31  GLUCOSE 143* 172*  --  138* 132* 142*  --  136* 127*  BUN 18 18  --  27* 38* 45*  --  35* 24*  CREATININE 0.98 1.09   < > 1.66* 1.74* 1.48* 1.16 0.95 0.78  CALCIUM 8.6* 9.6  --  8.8* 8.5* 8.5*  --  9.2 9.2  MG 1.5* 2.0  --   --  1.7  --   --   --   --   PHOS  --   --   --   --  3.5  --   --   --   --    < > = values in this interval not displayed.    GFR: Estimated Creatinine Clearance: 114 mL/min (by C-G formula based on SCr of 0.78 mg/dL). Liver Function Tests: Recent Labs  Lab 02/03/21 2355 02/04/21 0351 02/06/21 0425  AST 28 21 18   ALT 16 19 7   ALKPHOS 77 95 74  BILITOT 1.0 1.2 0.7  PROT 5.2* 6.5 5.3*  ALBUMIN 2.5* 3.0* 2.6*    No results for input(s): LIPASE, AMYLASE in the last 168 hours. No results for input(s): AMMONIA in the last 168 hours. Coagulation Profile: Recent Labs  Lab 02/03/21 2355 02/04/21 0351 02/06/21 0425  INR 1.1 1.1 1.3*    Cardiac Enzymes: No results for input(s): CKTOTAL, CKMB, CKMBINDEX, TROPONINI in the last 168 hours. BNP (last 3 results) No results for input(s): PROBNP in the last 8760 hours. HbA1C: No results for input(s): HGBA1C in the last 72 hours. CBG: Recent Labs  Lab 02/09/21 2036 02/10/21 0012 02/10/21 0626 02/10/21 1105 02/10/21 1611  GLUCAP 188* 142* 119* 216* 183*    Lipid Profile: No results for input(s): CHOL, HDL, LDLCALC, TRIG, CHOLHDL, LDLDIRECT in the last 72 hours. Thyroid Function Tests: No results for input(s): TSH, T4TOTAL, FREET4, T3FREE, THYROIDAB in the last 72 hours. Anemia Panel: No results for input(s): VITAMINB12, FOLATE, FERRITIN, TIBC, IRON, RETICCTPCT in the last 72 hours. Urine analysis:    Component Value Date/Time   COLORURINE YELLOW 02/04/2021 1037   APPEARANCEUR HAZY (A) 02/04/2021 1037   LABSPEC 1.011 02/04/2021 1037   PHURINE 6.0 02/04/2021 1037   GLUCOSEU NEGATIVE 02/04/2021 1037   HGBUR NEGATIVE 02/04/2021 1037   BILIRUBINUR NEGATIVE  02/04/2021 1037   KETONESUR NEGATIVE 02/04/2021 1037   PROTEINUR 100 (A) 02/04/2021 1037   NITRITE NEGATIVE 02/04/2021 1037   LEUKOCYTESUR LARGE (A) 02/04/2021 1037   Sepsis Labs: @LABRCNTIP (procalcitonin:4,lacticidven:4)  ) Recent Results (from the past 240 hour(s))  Resp Panel by RT-PCR (Flu A&B, Covid) Nasopharyngeal Swab     Status: None   Collection Time: 02/03/21 11:45 PM   Specimen: Nasopharyngeal Swab; Nasopharyngeal(NP) swabs in vial transport medium  Result Value Ref Range Status   SARS Coronavirus 2 by RT PCR NEGATIVE NEGATIVE Final    Comment: (NOTE) SARS-CoV-2 target nucleic acids are NOT DETECTED.  The SARS-CoV-2 RNA is generally detectable in upper respiratory specimens during the acute phase of infection. The lowest concentration of SARS-CoV-2 viral copies this assay can detect is 138 copies/mL. A negative result does not preclude SARS-Cov-2 infection and should not be used as the sole basis for treatment or other patient management decisions. A negative result may occur with  improper specimen collection/handling, submission of specimen other than nasopharyngeal swab, presence of viral mutation(s) within the areas targeted by this assay, and inadequate number of viral copies(<138 copies/mL). A negative result must be combined with clinical observations, patient history, and epidemiological information. The expected result is Negative.  Fact Sheet for Patients:  EntrepreneurPulse.com.au  Fact Sheet for Healthcare Providers:  IncredibleEmployment.be  This test is no t yet approved or cleared by the [place] FDA and  has been authorized for detection and/or diagnosis of SARS-CoV-2 by FDA under an Emergency Use Authorization (EUA). This EUA will remain  in effect (meaning this test can be used) for the duration of the COVID-19 declaration under Section 564(b)(1) of the Act, 21 U.S.C.section 360bbb-3(b)(1), unless the  authorization is terminated  or revoked sooner.  Influenza A by PCR NEGATIVE NEGATIVE Final   Influenza B by PCR NEGATIVE NEGATIVE Final    Comment: (NOTE) The Xpert Xpress SARS-CoV-2/FLU/RSV plus assay is intended as an aid in the diagnosis of influenza from Nasopharyngeal swab specimens and should not be used as a sole basis for treatment. Nasal washings and aspirates are unacceptable for Xpert Xpress SARS-CoV-2/FLU/RSV testing.  Fact Sheet for Patients: EntrepreneurPulse.com.au  Fact Sheet for Healthcare Providers: IncredibleEmployment.be  This test is not yet approved or cleared by the [place] FDA and has been authorized for detection and/or diagnosis of SARS-CoV-2 by FDA under an Emergency Use Authorization (EUA). This EUA will remain in effect (meaning this test can be used) for the duration of the COVID-19 declaration under Section 564(b)(1) of the Act, 21 U.S.C. section 360bbb-3(b)(1), unless the authorization is terminated or revoked.  Performed at [facility], [place] [address], [place], [place] [address]       Studies: No results found.  Scheduled Meds:  carvedilol  25 mg Oral BID WC   Chlorhexidine Gluconate Cloth  6 each Topical Daily   docusate sodium  100 mg Oral BID   enoxaparin (LOVENOX) injection  70 mg Subcutaneous Q24H   finasteride  5 mg Oral Daily   insulin aspart  0-6 Units Subcutaneous Q6H   polyethylene glycol  17 g Oral Daily   pravastatin  10 mg Oral QHS   tamsulosin  0.4 mg Oral QHS    Continuous Infusions:   LOS: 7 days     Cristal Deer, MD [facility]  To reach me or the doctor on call, go to: [email or web address] Password [email or web address]  02/10/2021, 6:41 PM

## 2021-02-10 NOTE — Plan of Care (Signed)

## 2021-02-11 ENCOUNTER — Inpatient Hospital Stay (HOSPITAL_COMMUNITY): Payer: Medicare Other

## 2021-02-11 DIAGNOSIS — M79604 Pain in right leg: Secondary | ICD-10-CM

## 2021-02-11 DIAGNOSIS — M25561 Pain in right knee: Secondary | ICD-10-CM

## 2021-02-11 DIAGNOSIS — N179 Acute kidney failure, unspecified: Secondary | ICD-10-CM

## 2021-02-11 LAB — CREATININE, SERUM
Creatinine, Ser: 0.85 mg/dL (ref 0.61–1.24)
GFR, Estimated: >60 mL/min (ref >60)

## 2021-02-11 LAB — GLUCOSE, CAPILLARY
Glucose-Capillary: 115 mg/dL — ABNORMAL HIGH (ref 70–99)
Glucose-Capillary: 116 mg/dL — ABNORMAL HIGH (ref 70–99)
Glucose-Capillary: 136 mg/dL — ABNORMAL HIGH (ref 70–99)
Glucose-Capillary: 153 mg/dL — ABNORMAL HIGH (ref 70–99)

## 2021-02-11 LAB — RESP PANEL BY RT-PCR (FLU A&B, COVID) ARPGX2
Influenza A by PCR: NEGATIVE
Influenza B by PCR: NEGATIVE
SARS Coronavirus 2 by RT PCR: NEGATIVE

## 2021-02-11 NOTE — TOC Transition Note (Signed)
Transition of Care [facility]) - CM/SW Discharge Note   Patient Details  Name: Tom Macpherson MRN: [id number] Date of Birth: 1945/12/14  Transition of Care [facility] [place]) CM/SW Contact:  Milinda Antis, [place] Phone Number: 02/11/2021, 3:55 PM   Clinical Narrative:     Patient will DC to: [place] Anticipated DC date: 02/11/2021 Family notified: Yes Transport by: Corey Harold   Per MD patient ready for DC to SNF . RN to call report prior to discharge [phone]. RN, patient, patient's family, and facility notified of DC. Discharge Summary and FL2 sent to facility. DC packet on chart. Ambulance transport requested for patient.   CSW will sign off for now as social work intervention is no longer needed. Please consult [place] again if new needs arise.    Final next level of care: Skilled Nursing Facility Barriers to Discharge: No Barriers Identified   Patient Goals and CMS Choice   CMS Medicare.gov Compare Post Acute Care list provided to:: Patient Choice offered to / list presented to : Patient  Discharge Placement              Patient chooses bed at:  [facility]) Patient to be transferred to facility by: [place] Name of family member notified: Kristi, [facility])   [phone] Patient and family notified of of transfer: 02/08/21  Discharge Plan and Services                                     Social Determinants of Health ([place]) Interventions     Readmission Risk Interventions Readmission Risk Prevention Plan 10/06/2019 10/06/2019  Transportation Screening - Complete  PCP or Specialist Appt within 3-5 Days Complete -  [place]RI or [place] - Complete  Social Work Consult for [place] Planning/Counseling - Complete  Palliative Care Screening - Not Applicable  Medication Review [employer] photographer) - Complete

## 2021-02-11 NOTE — Progress Notes (Deleted)
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  He was found to have an acute distal right femoral fracture, orthopedic surgery was consulted and he was admitted to the hospital.  There was no syncope/loss of consciousness it appears that this was a mechanical fall  Subjective / 24h Interval events: Complains of swelling in the right leg  Assessment & Plan: Principal Problem Acute distal right femoral fracture -Orthopedic surgery consulted, patient was taken to the OR on 7/11 and he is status post ORIF of right distal femur fracture as well as closed treatment of the patella fracture. -DVT prophylaxis with Lovenox -PT recommending SNF, insurance authorization still pending today  Active Problems Acute kidney injury due to intermittent urinary obstruction-patient has BPH and has been having issues with urinary obstruction in the past.  He now has a Foley catheter which I would recommend staying in for at least 3 to 4 days after discharge and give a voiding trial once more mobile  Right leg swelling-significant today, there is a degree of postop swelling but this appears to involve the calf as well as mid thigh.  Obtain a Doppler to rule out DV  PAF-not on anticoagulation due to history of liver cirrhosis and prior GI bleeds.    Type 2 diabetes mellitus -most recent A1c 6.9.  Continue sliding scale, CBGs are controlled  CBG (last 3)  Recent Labs    02/11/21 0003 02/11/21 0603 02/11/21 1226  GLUCAP 153* 136* 115*    Liver cirrhosis, thrombocytopenia-diagnosed about couple years ago.  NASH/EtOH is mentioned in the GI  notes.  Patient does report some EtOH use but has not been drinking for more than a decade.   -Overall stable, platelets 108 today.  Continue to monitor  Hypomagnesemia-repleted  BPH-continue home medications with tamsulosin, finasteride, bladder scan today due to acute kidney injury.  Obesity class III-BMI > 40, he would benefit from weight loss  Scheduled Meds:  carvedilol  25 mg Oral BID WC   Chlorhexidine Gluconate Cloth  6 each Topical Daily   docusate sodium  100 mg Oral BID   enoxaparin (LOVENOX) injection  70 mg Subcutaneous Q24H   finasteride  5 mg Oral Daily   insulin aspart  0-6 Units Subcutaneous Q6H   polyethylene glycol  17 g Oral Daily   pravastatin  10 mg Oral QHS   tamsulosin  0.4 mg Oral QHS   Continuous Infusions:  PRN Meds:.acetaminophen, HYDROcodone-acetaminophen, HYDROcodone-acetaminophen, metoCLOPramide **OR** metoCLOPramide (REGLAN) injection, morphine injection, naLOXone (NARCAN)  injection, ondansetron **OR** ondansetron (ZOFRAN) IV  Diet Orders (From admission, onward)     Start     Ordered   02/08/21 0000  Diet - low sodium heart healthy        02/08/21 1117   02/04/21 1816  Diet Carb Modified Fluid consistency: Thin; Room service appropriate? Yes  Diet effective now       Question Answer Comment  Calorie Level Medium 1600-2000   Fluid consistency: Thin   Room service appropriate? Yes      02/04/21 1815            DVT prophylaxis: SCDs Start: 02/04/21  5 SCDs Start: 02/03/21 2247     Code Status: DNR  Family Communication: No family at bedside  Status is: Inpatient  Remains inpatient appropriate because:Inpatient level of care appropriate due to severity of illness  Dispo: The patient is from: Home              Anticipated d/c is to: SNF              Patient currently is not medically stable to d/c.   Difficult to place patient No   Level of care: Med-Surg  Consultants:  Orthopedic surgery  Procedures:  ORIF femur  fracture  Microbiology  none  Antimicrobials: none    Objective: Vitals:   02/10/21 1510 02/10/21 2112 02/11/21 0500 02/11/21 0803  BP: 116/60 (!) 101/56  (!) 147/93  Pulse: 82 77  84  Resp:  16  18  Temp: 98.1 F (36.7 C) 98.2 F (36.8 C)  98.9 F (37.2 C)  TempSrc: Oral Oral  Oral  SpO2: 98% 96%  97%  Weight:   (!) 152.2 kg   Height:        Intake/Output Summary (Last 24 hours) at 02/11/2021 1245 Last data filed at 02/11/2021 0500 Gross per 24 hour  Intake --  Output 550 ml  Net -550 ml    Filed Weights   02/08/21 0500 02/10/21 0500 02/11/21 0500  Weight: (!) 157.7 kg (!) 153 kg (!) 152.2 kg    Examination:  Constitutional: NAD, in bed Eyes: No icterus ENMT: mmm Neck: normal, supple Respiratory: Clear bilaterally, no wheezing, normal respiratory effort Cardiovascular: rrr, no mrg, RLE edema >> LLE Abdomen: Soft, NT, ND, bowel sounds positive Musculoskeletal: no clubbing / cyanosis.  Skin: No rashes seen Neurologic: Nonfocal  Data Reviewed: I have independently reviewed following labs and imaging studies   CBC: Recent Labs  Lab 02/04/21 1829 02/05/21 0310 02/06/21 0425 02/07/21 0209 02/10/21 0233  WBC 11.0* 8.8 6.7 5.4 5.6  HGB 11.3* 9.8* 9.0* 8.6* 9.0*  HCT 36.9* 32.2* 31.0* 28.6* 30.4*  MCV 95.8 96.7 97.5 96.9 96.8  PLT 139* 108* 88* 96* 761    Basic Metabolic Panel: Recent Labs  Lab 02/05/21 0310 02/06/21 0425 02/07/21 0209 02/08/21 0331 02/09/21 0205 02/10/21 0233 02/11/21 0719  NA 139 136 135  --  138 137  --   K 5.0 4.7 4.4  --  4.3 4.1  --   CL 103 102 102  --  102 102  --   CO2 33* 26 27  --  27 31  --   GLUCOSE 138* 132* 142*  --  136* 127*  --   BUN 27* 38* 45*  --  35* 24*  --   CREATININE 1.66* 1.74* 1.48* 1.16 0.95 0.78 0.85  CALCIUM 8.8* 8.5* 8.5*  --  9.2 9.2  --   MG  --  1.7  --   --   --   --   --   PHOS  --  3.5  --   --   --   --   --     Liver Function Tests: Recent Labs  Lab 02/06/21 0425  AST 18  ALT  7  ALKPHOS 74  BILITOT 0.7  PROT 5.3*  ALBUMIN 2.6*    Coagulation Profile: Recent Labs  Lab 02/06/21 0425  INR 1.3*    HbA1C: No results for input(s): HGBA1C in the last 72 hours. CBG: Recent Labs  Lab 02/10/21 1105 02/10/21 1611 02/11/21 0003 02/11/21  0603 02/11/21 1226  GLUCAP 216* 183* 153* 136* 115*     Recent Results (from the past 240 hour(s))  Resp Panel by RT-PCR (Flu A&B, Covid) Nasopharyngeal Swab     Status: None   Collection Time: 02/03/21 11:45 PM   Specimen: Nasopharyngeal Swab; Nasopharyngeal(NP) swabs in vial transport medium  Result Value Ref Range Status   SARS Coronavirus 2 by RT PCR NEGATIVE NEGATIVE Final    Comment: (NOTE) SARS-CoV-2 target nucleic acids are NOT DETECTED.  The SARS-CoV-2 RNA is generally detectable in upper respiratory specimens during the acute phase of infection. The lowest concentration of SARS-CoV-2 viral copies this assay can detect is 138 copies/mL. A negative result does not preclude SARS-Cov-2 infection and should not be used as the sole basis for treatment or other patient management decisions. A negative result may occur with  improper specimen collection/handling, submission of specimen other than nasopharyngeal swab, presence of viral mutation(s) within the areas targeted by this assay, and inadequate number of viral copies(<138 copies/mL). A negative result must be combined with clinical observations, patient history, and epidemiological information. The expected result is Negative.  Fact Sheet for Patients:  EntrepreneurPulse.com.au  Fact Sheet for Healthcare Providers:  IncredibleEmployment.be  This test is no t yet approved or cleared by the [place] FDA and  has been authorized for detection and/or diagnosis of SARS-CoV-2 by FDA under an Emergency Use Authorization (EUA). This EUA will remain  in effect (meaning this test can be used) for the duration of  the COVID-19 declaration under Section 564(b)(1) of the Act, 21 U.S.C.section 360bbb-3(b)(1), unless the authorization is terminated  or revoked sooner.       Influenza A by PCR NEGATIVE NEGATIVE Final   Influenza B by PCR NEGATIVE NEGATIVE Final    Comment: (NOTE) The Xpert Xpress SARS-CoV-2/FLU/RSV plus assay is intended as an aid in the diagnosis of influenza from Nasopharyngeal swab specimens and should not be used as a sole basis for treatment. Nasal washings and aspirates are unacceptable for Xpert Xpress SARS-CoV-2/FLU/RSV testing.  Fact Sheet for Patients: EntrepreneurPulse.com.au  Fact Sheet for Healthcare Providers: IncredibleEmployment.be  This test is not yet approved or cleared by the [place] FDA and has been authorized for detection and/or diagnosis of SARS-CoV-2 by FDA under an Emergency Use Authorization (EUA). This EUA will remain in effect (meaning this test can be used) for the duration of the COVID-19 declaration under Section 564(b)(1) of the Act, 21 U.S.C. section 360bbb-3(b)(1), unless the authorization is terminated or revoked.  Performed at [facility], [place] [address] [place], [place] [address]       Radiology Studies: No results found.  Marzetta Board, MD, PhD [facility]  Between 7 am - 7 pm I am available, please contact me via Amion (for emergencies) or Securechat (non urgent messages)  Between 7 pm - 7 am I am not available, please contact night coverage MD/APP via Amion

## 2021-02-11 NOTE — Progress Notes (Signed)
PT Cancellation Note  Patient Details Name: Brandon [name] MRN: [id number] DOB: 1945/12/18   Cancelled Treatment:    Reason Eval/Treat Not Completed: (P) Patient not medically ready;Medical issues which prohibited therapy (Pt awaiting doppler study, will f/u per POC)   [name] 02/11/2021, 11:10 AM  Erasmo Leventhal , PTA Acute Rehabilitation Services Pager [phone] Office [phone]

## 2021-02-11 NOTE — Progress Notes (Signed)
Attempted calling report to [facility]; but was told by receiver on the other line that the nurse had stepped out and to call back within 30 minutes. Will re-attempt then.  VWilliams,RN.

## 2021-02-11 NOTE — Progress Notes (Signed)
OT Cancellation Note  Patient Details Name: Brandon [name] MRN: [id number] DOB: Jun 11, 1946   Cancelled Treatment:    Reason Eval/Treat Not Completed: Medical issues which prohibited therapy. Pt awaiting doppler study, OT will follow up as appropriate  Britt Bottom 02/11/2021, 11:57 AM

## 2021-02-11 NOTE — Progress Notes (Signed)
Discharge information including scripts placed in discharge envelope and left in drawer at patient's room. Report called and given to [place], Therapist, sports at [facility]. All of nurse's questions answered to her satisfaction. Pt to be transported by [facility]. Awaiting ambulance for transfer.

## 2021-02-11 NOTE — Discharge Summary (Signed)
Physician Discharge Summary  Brandon [name] PRF:[id number] DOB: 16-May-1946 DOA: 02/03/2021  PCP: Helane Rima, MD  Admit date: 02/03/2021 Discharge date: 02/11/2021  Admitted From: home Disposition:  SNF  Recommendations for Outpatient Follow-up:  Follow up with PCP in 1-2 weeks  Home Health: none Equipment/Devices: none  Discharge Condition: stable CODE STATUS: DNR Diet recommendation: regular  HPI: Per admitting MD, Brandon [name] is a 75 y.o. male with medical history significant for paroxysmal atrial fibrillation not chronically anticoagulation in setting of history of recurrent gastrointestinal bleeding, cirrhosis, chronic anemia with baseline hemoglobin 11-13, type 2 diabetes mellitus, who is admitted to [facility] on 02/03/2021 with acute distal right femoral fracture after presenting from home to [facility] ED for evaluation of ground-level mechanical fall.   Hospital Course / Discharge diagnoses: Principal Problem Acute distal right femoral fracture -Orthopedic surgery consulted, patient was taken to the OR on 7/11 and he is status post ORIF of right distal femur fracture as well as closed treatment of the patella fracture. DVT prophylaxis with Lovenox. PT recommending SNF   Active Problems Acute kidney injury due to intermittent urinary obstruction-patient has BPH and has been having issues with urinary obstruction in the past.  He now has a Foley catheter which I would recommend staying in for at least 3 to 4 days after discharge and give a voiding trial once more mobile Right leg swelling-significant today, [place] negative for DVT. Mobilize PAF-not on anticoagulation due to history of liver cirrhosis and prior GI bleeds.   Type 2 diabetes mellitus -most recent A1c 6.9.   Liver cirrhosis, thrombocytopenia-diagnosed about couple years ago.  NASH/EtOH is mentioned in the GI notes.  Patient does report some EtOH use but has not been drinking for more than a decade.    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 No dressing needed. Wash with warm water and pat dry PRN   Increase activity slowly   Complete by: As directed       Allergies as of 02/11/2021   No Known Allergies      Medication List     STOP taking these medications    predniSONE 5 MG tablet Commonly known as: DELTASONE   senna-docusate 8.6-50 MG tablet Commonly known as: Senokot-S       TAKE these medications    acetaminophen 500 MG tablet Commonly known as: TYLENOL Take 1,000-1,500 mg by mouth every 6 (six) hours as needed for moderate pain.   carvedilol 25 MG tablet Commonly known as: COREG Take 25 mg by mouth 2 (two) times daily with a meal.   docusate sodium 100 MG capsule Commonly known as: COLACE Take 1 capsule (100 mg total) by mouth 2 (two) times daily.   enoxaparin 80 MG/0.8ML injection Commonly known as: LOVENOX Inject 0.7 mLs (70 mg total) into the skin daily.   finasteride 5 MG tablet Commonly known as: PROSCAR Take 1 tablet (5 mg total) by mouth daily.   furosemide 20 MG tablet Commonly known as: LASIX Take 20 mg by mouth 2 (two) times daily.   HYDROcodone-acetaminophen 5-325 MG tablet Commonly known as: NORCO/VICODIN Take 1-2 tablets by mouth every 4 (four) hours as  needed for moderate pain (pain score 4-6).   hydrOXYzine 25 MG tablet Commonly known as: ATARAX/VISTARIL Take 1 tablet (25 mg total) by mouth daily.   metFORMIN 500 MG 24 hr tablet Commonly known as: GLUCOPHAGE-XR Take 500 mg by mouth daily with breakfast.   nystatin powder Commonly known as: MYCOSTATIN/NYSTOP Apply 1 application topically daily as needed  (irritation).   ondansetron 4 MG tablet Commonly known as: ZOFRAN Take 1 tablet (4 mg total) by mouth every 6 (six) hours as needed for nausea.   pantoprazole 40 MG tablet Commonly known as: PROTONIX Take 1 tablet (40 mg total) by mouth daily.   polyethylene glycol 17 g packet Commonly known as: MIRALAX / GLYCOLAX Take 17 g by mouth daily. What changed: when to take this   pravastatin 10 MG tablet Commonly known as: PRAVACHOL Take 10 mg by mouth at bedtime.   spironolactone 50 MG tablet Commonly known as: ALDACTONE Take 50 mg by mouth daily.   tamsulosin 0.4 MG Caps capsule Commonly known as: FLOMAX Take 0.4 mg by mouth at bedtime.   triamcinolone cream 0.1 % Commonly known as: KENALOG Apply 1 application topically 2 (two) times daily.               Discharge Care Instructions  (From admission, onward)           Start     Ordered   02/08/21 0000  Discharge wound care:       Comments: Cover for comfort. No dressing needed. Wash with warm water and pat dry PRN   02/08/21 1117            Contact information for follow-up providers     Haddix, Thomasene Lot, MD. Schedule an appointment as soon as possible for a visit in 2 week(s).   Specialty: Orthopedic Surgery Why: for repeat x-rays and wound check Contact information: [place] [address] [phone]         Helane Rima, MD. Schedule an appointment as soon as possible for a visit in 1 week(s).   Specialty: Family Medicine Contact information: [place] [address] [phone]              Contact information for after-discharge care     Destination     [facility] .   Service: Skilled Nursing Contact information: [address] [place] [phone]                     Consultations: Orthopedic surgery   Procedures/Studies:  DG Chest 2 View  Result  Date: 02/03/2021 CLINICAL DATA:  Preoperative radiography EXAM: CHEST - 2 VIEW COMPARISON:  09/16/2019 FINDINGS: Prior sternotomy and CABG. Cardiomegaly. Tortuous and calcified aorta. Remaining cardiomediastinal contours are unremarkable. Pulmonary vascular congestion with indistinct and cephalized pulmonary vascularity. Some hazy reticular opacities are present with mild fissural thickening and few septal lines which could reflect combination of atelectasis and mild interstitial edema. No consolidative process. No pneumothorax. No effusion. Degenerative changes in the shoulders, right greater than left. Multilevel degenerative changes are present in the imaged portions of the spine. IMPRESSION: Appearance most compatible with mild CHF/volume overload with cardiomegaly, vascular congestion and hazy interstitial opacity suggestive of pulmonary edema. Aortic Atherosclerosis (ICD10-I70.0). Electronically Signed   By: Lovena Le M.D.   On: 02/03/2021 23:00   DG Pelvis 1-2 Views  Result Date: 02/03/2021 CLINICAL DATA:  Pain after fall  EXAM: PELVIS - 1-2 VIEW COMPARISON:  CT August 14, 2019 FINDINGS: Diffuse demineralization of bone. There is no evidence of pelvic fracture or diastasis. Degenerative change of the hips. Lower lumbar spondylosis. IMPRESSION: No acute displaced fracture or dislocation on this single AP view of the pelvis. Electronically Signed   By: Dahlia Bailiff MD   On: 02/03/2021 22:30   DG Knee 2 Views Right  Result Date: 02/03/2021 CLINICAL DATA:  Pain after fall EXAM: RIGHT KNEE - 1-2 VIEW COMPARISON:  Radiograph September 30, 2018 FINDINGS: Diffuse demineralization of bone. Comminuted impacted fracture of the distal femoral metadiaphysis with approximately 13 mm medial displacement and lateral angulation of the distal fracture fragment, without definite intra-articular extension. Irregularity of the upper pole of patella, related to prior avulsion fracture. Joint effusion. Advanced  tricompartment degenerative change chondrocalcinosis. Vascular calcifications. IMPRESSION: 1. Comminuted, displaced and angulated distal femoral metadiaphyseal fracture, without definite intra-articular extension. 2. Irregularity of the upper pole of patella, related to prior known avulsion fracture. 3. 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 Electronically Signed   By: Dahlia Bailiff MD   On: 02/03/2021 22:28   CT Head Wo Contrast  Result Date: 02/03/2021 CLINICAL DATA:  75 year old male with neck trauma. EXAM: CT HEAD WITHOUT CONTRAST CT CERVICAL SPINE WITHOUT CONTRAST TECHNIQUE: Multidetector CT imaging of the head and cervical spine was performed following the standard protocol without intravenous contrast. Multiplanar CT image reconstructions of the cervical spine were also generated. COMPARISON:  None. FINDINGS: CT HEAD FINDINGS Brain: Mild age-related atrophy and chronic microvascular ischemic changes. There is no acute intracranial hemorrhage. No mass effect or midline shift no extra-axial fluid collection. Vascular: No hyperdense vessel or unexpected calcification. Skull: Normal. Negative for fracture or focal lesion. Sinuses/Orbits: There is complete opacification of the left maxillary sinus. The remainder of the visualized paranasal sinuses and mastoid air cells are clear. No air-fluid level. Other: None CT CERVICAL SPINE FINDINGS Alignment: No acute subluxation. There is straightening of normal cervical lordosis which may be positional or due to muscle spasm. Skull base and vertebrae: No acute fracture. Osteopenia. Soft tissues and spinal canal: No prevertebral fluid or swelling. No visible canal hematoma. Disc levels: Multilevel degenerative changes with disc space narrowing and spurring. Upper chest: Negative. Other: Bilateral carotid bulb calcified plaques. IMPRESSION: 1. No acute intracranial pathology. Mild age-related atrophy and chronic microvascular  ischemic changes. 2. No acute/traumatic cervical spine pathology. Multilevel degenerative changes. Electronically Signed   By: Anner Crete M.D.   On: 02/03/2021 22:41   CT Cervical Spine Wo Contrast  Result Date: 02/03/2021 CLINICAL DATA:  75 year old male with neck trauma. EXAM: CT HEAD WITHOUT CONTRAST CT CERVICAL SPINE WITHOUT CONTRAST TECHNIQUE: Multidetector CT imaging of the head and cervical spine was performed following the standard protocol without intravenous contrast. Multiplanar CT image reconstructions of the cervical spine were also generated. COMPARISON:  None. FINDINGS: CT HEAD FINDINGS Brain: Mild age-related atrophy and chronic microvascular ischemic changes. There is no acute intracranial hemorrhage. No mass effect or midline shift no extra-axial fluid collection. Vascular: No hyperdense vessel or unexpected calcification. Skull: Normal. Negative for fracture or focal lesion. Sinuses/Orbits: There is complete opacification of the left maxillary sinus. The remainder of the visualized paranasal sinuses and mastoid air cells are clear. No air-fluid level. Other: None CT CERVICAL SPINE FINDINGS Alignment: No acute subluxation. There is straightening of normal cervical lordosis which may be positional or due to muscle spasm. Skull base and vertebrae: No acute fracture. Osteopenia. Soft  tissues and spinal canal: No prevertebral fluid or swelling. No visible canal hematoma. Disc levels: Multilevel degenerative changes with disc space narrowing and spurring. Upper chest: Negative. Other: Bilateral carotid bulb calcified plaques. IMPRESSION: 1. No acute intracranial pathology. Mild age-related atrophy and chronic microvascular ischemic changes. 2. No acute/traumatic cervical spine pathology. Multilevel degenerative changes. Electronically Signed   By: Anner Crete M.D.   On: 02/03/2021 22:41   CT Knee Right Wo Contrast  Result Date: 02/03/2021 CLINICAL DATA:  Recent fall with known distal  right femoral fracture EXAM: CT OF THE RIGHT KNEE WITHOUT CONTRAST TECHNIQUE: Multidetector CT imaging of the right knee was performed according to the standard protocol. Multiplanar CT image reconstructions were also generated. COMPARISON:  Plain film from earlier in the same day as well as prior CT from 09/30/2018 FINDINGS: Bones/Joint/Cartilage There is again noted a comminuted fracture of the distal right femur involving primarily the distal aspect of the diaphysis and proximal metaphysis. Posterior and lateral displacement of the distal fracture fragments is noted. Some mild impaction is seen as well with approximately 1 cm of bony overlap along the medial aspect of the distal femur. No articular involvement is identified. Osteophytic changes are noted in all 3 joint compartments. Proximal tibia and fibula show no acute fracture. Further fragmentation in the anterior and inferior aspect of the patella is noted although no significant distraction is seen. Ligaments Suboptimally assessed by CT. Muscles and Tendons Surrounding musculature demonstrates some fatty infiltration likely related to disuse atrophy. Soft tissues Joint effusion is noted. Some prepatellar soft tissue swelling and surrounding subcutaneous edema is noted as well. IMPRESSION: Comminuted and displaced distal right femoral fracture without significant intra-articular involvement. Associated soft tissue swelling and joint effusion is noted. Further displacement of patellar fracture when compared with the previous exam. No distraction of the patellar fragments is seen however. Tricompartmental degenerative changes. Electronically Signed   By: Inez Catalina M.D.   On: 02/03/2021 23:06   US RENAL  Result Date: 02/06/2021 CLINICAL DATA:  Acute kidney insufficiency. EXAM: RENAL / URINARY TRACT ULTRASOUND COMPLETE COMPARISON:  CT of the abdomen and pelvis 08/14/2019. MRI of the abdomen without and with contrast 10/24/2019 FINDINGS: Right Kidney:  Renal measurements: 12.0 x 6.7 x 6.0 cm = volume: 251 mL. Lower pole of the right kidney is poorly visualized due to bowel gas and patient body habitus. Simple exophytic cyst measuring up to 2.3 mm is stable. No other focal lesions are present. No obstruction is present. Left Kidney: Renal measurements: 0.5 x 6.7 x 6.7 cm = volume: 267 mL. Simple cyst at the upper pole of the right kidney that simple cyst at the upper pole again demonstrated, measuring 4.1 x 3.9 x 3.5 cm no new lesions are present. Renal parenchyma otherwise normal. Bladder: Bladder wall thickness and irregularity again noted. No focal lesions present. Other: None. IMPRESSION: 1. No acute abnormality. 2. Stable bilateral simple renal cysts. 3. No obstruction. Electronically Signed   By: San Morelle M.D.   On: 02/06/2021 19:09   DG CHEST PORT 1 VIEW  Result Date: 02/08/2021 CLINICAL DATA:  Dyspnea EXAM: PORTABLE CHEST 1 VIEW COMPARISON:  02/03/2021 FINDINGS: Post CABG changes. Stable cardiomegaly. Atherosclerotic calcification of the aortic knob. Diffuse interstitial opacities throughout both lungs. No appreciable pleural fluid collection. No pneumothorax. IMPRESSION: Diffuse interstitial opacities throughout both lungs suggesting pulmonary edema versus atypical/viral infection. Findings slightly progressed from previous radiograph. Electronically Signed   By: Davina Poke D.O.   On: 02/08/2021 12:16  DG Knee Right Port  Result Date: 02/04/2021 CLINICAL DATA:  Distal right femur ORIF EXAM: PORTABLE RIGHT KNEE - 1-2 VIEW COMPARISON:  02/03/2021 FINDINGS: Interval ORIF of a distal femoral metaphyseal fracture with lateral sideplate and screw fixation construct. Femoral fracture alignment is near anatomic. Mildly displaced patellar fracture appears unchanged in alignment. 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 Expected postoperative changes within the overlying soft tissues. Extensive vascular calcification.  IMPRESSION: Interval ORIF of a distal femoral metaphyseal fracture, in near anatomic alignment. Electronically Signed   By: Davina Poke D.O.   On: 02/04/2021 21:23   DG C-Arm 1-60 Min  Result Date: 02/04/2021 CLINICAL DATA:  75 year old male with ORIF of the right femur. EXAM: RIGHT FEMUR 2 VIEWS; DG C-ARM 1-60 MIN COMPARISON:  Right femur radiograph dated 02/03/2021. FINDINGS: Seven intraoperative fluoroscopic spot images provided. The total fluoroscopic time is 54 seconds with a total air kerma of 9.47 mGy. There has been interval placement of fixation sideplate screws along the distal femoral fracture. IMPRESSION: Fluoroscopic spot images of ORIF of the right femur. Electronically Signed   By: Anner Crete M.D.   On: 02/04/2021 21:21   DG FEMUR, MIN 2 VIEWS RIGHT  Result Date: 02/04/2021 CLINICAL DATA:  75 year old male with ORIF of the right femur. EXAM: RIGHT FEMUR 2 VIEWS; DG C-ARM 1-60 MIN COMPARISON:  Right femur radiograph dated 02/03/2021. FINDINGS: Seven intraoperative fluoroscopic spot images provided. The total fluoroscopic time is 54 seconds with a total air kerma of 9.47 mGy. There has been interval placement of fixation sideplate screws along the distal femoral fracture. IMPRESSION: Fluoroscopic spot images of ORIF of the right femur. Electronically Signed   By: Anner Crete M.D.   On: 02/04/2021 21:21   VAS [place] LOWER EXTREMITY VENOUS (DVT)  Result Date: 02/11/2021  Lower Venous DVT Study Patient Name:  Brandon [name]  Date of Exam:   02/11/2021 Medical Rec #: [id number]     Accession #:    [id number] Date of Birth: 03-18-1946     Patient Gender: M Patient Age:   65Y Exam Location:  [facility] Procedure:      VAS [place] LOWER EXTREMITY VENOUS (DVT) Referring Phys: [place] --------------------------------------------------------------------------------  Indications: Pain.  Risk Factors: Trauma. Limitations: Body habitus and poor ultrasound/tissue interface.  Comparison Study: No prior studies. Performing Technologist: Oliver Hum RVT  Examination Guidelines: A complete evaluation includes B-mode imaging, spectral Doppler, color Doppler, and power Doppler as needed of all accessible portions of each vessel. Bilateral testing is considered an integral part of a complete examination. Limited examinations for reoccurring indications may be performed as noted. The reflux portion of the exam is performed with the patient in reverse Trendelenburg.  +---------+---------------+---------+-----------+----------+-------------------+ RIGHT    CompressibilityPhasicitySpontaneityPropertiesThrombus Aging      +---------+---------------+---------+-----------+----------+-------------------+ CFV      Full           Yes      Yes                                      +---------+---------------+---------+-----------+----------+-------------------+ SFJ      Full                                                             +---------+---------------+---------+-----------+----------+-------------------+  FV Prox  Full                                                             +---------+---------------+---------+-----------+----------+-------------------+ FV Mid                  Yes      Yes                                      +---------+---------------+---------+-----------+----------+-------------------+ FV Distal               Yes      Yes                                      +---------+---------------+---------+-----------+----------+-------------------+ PFV      Full                                                             +---------+---------------+---------+-----------+----------+-------------------+ POP      Full           Yes      Yes                                      +---------+---------------+---------+-----------+----------+-------------------+ PTV      Full                                                              +---------+---------------+---------+-----------+----------+-------------------+ PERO                                                  Not well visualized +---------+---------------+---------+-----------+----------+-------------------+   +----+---------------+---------+-----------+----------+--------------+ LEFTCompressibilityPhasicitySpontaneityPropertiesThrombus Aging +----+---------------+---------+-----------+----------+--------------+ CFV Full           Yes      Yes                                 +----+---------------+---------+-----------+----------+--------------+    Summary: RIGHT: - There is no evidence of deep vein thrombosis in the lower extremity. However, portions of this examination were limited- see technologist comments above.  - No cystic structure found in the popliteal fossa.  LEFT: - No evidence of common femoral vein obstruction.  *See table(s) above for measurements and observations.    Preliminary      Subjective: - no chest pain, shortness of breath, no abdominal pain, nausea or vomiting.   Discharge Exam: BP (!) 147/93 (BP Location: Left Arm)   Pulse 84   Temp 98.9  F (37.2 C) (Oral)   Resp 18   Ht 5\' 6"  (1.676 m)   Wt (!) 152.2 kg   SpO2 97%   BMI 54.16 kg/m   General: Pt is alert, awake, not in acute distress Cardiovascular: RRR, S1/S2 +, no rubs, no gallops Respiratory: CTA bilaterally, no wheezing, no rhonchi Abdominal: Soft, NT, ND, bowel sounds + Extremities: no edema, no cyanosis   The results of significant diagnostics from this hospitalization (including imaging, microbiology, ancillary and laboratory) are listed below for reference.     Microbiology: Recent Results (from the past 240 hour(s))  Resp Panel by RT-PCR (Flu A&B, Covid) Nasopharyngeal Swab     Status: None   Collection Time: 02/03/21 11:45 PM   Specimen: Nasopharyngeal Swab; Nasopharyngeal(NP) swabs in vial transport medium  Result Value Ref Range Status   SARS  Coronavirus 2 by RT PCR NEGATIVE NEGATIVE Final    Comment: (NOTE) SARS-CoV-2 target nucleic acids are NOT DETECTED.  The SARS-CoV-2 RNA is generally detectable in upper respiratory specimens during the acute phase of infection. The lowest concentration of SARS-CoV-2 viral copies this assay can detect is 138 copies/mL. A negative result does not preclude SARS-Cov-2 infection and should not be used as the sole basis for treatment or other patient management decisions. A negative result may occur with  improper specimen collection/handling, submission of specimen other than nasopharyngeal swab, presence of viral mutation(s) within the areas targeted by this assay, and inadequate number of viral copies(<138 copies/mL). A negative result must be combined with clinical observations, patient history, and epidemiological information. The expected result is Negative.  Fact Sheet for Patients:  EntrepreneurPulse.com.au  Fact Sheet for Healthcare Providers:  IncredibleEmployment.be  This test is no t yet approved or cleared by the [place] FDA and  has been authorized for detection and/or diagnosis of SARS-CoV-2 by FDA under an Emergency Use Authorization (EUA). This EUA will remain  in effect (meaning this test can be used) for the duration of the COVID-19 declaration under Section 564(b)(1) of the Act, 21 U.S.C.section 360bbb-3(b)(1), unless the authorization is terminated  or revoked sooner.       Influenza A by PCR NEGATIVE NEGATIVE Final   Influenza B by PCR NEGATIVE NEGATIVE Final    Comment: (NOTE) The Xpert Xpress SARS-CoV-2/FLU/RSV plus assay is intended as an aid in the diagnosis of influenza from Nasopharyngeal swab specimens and should not be used as a sole basis for treatment. Nasal washings and aspirates are unacceptable for Xpert Xpress SARS-CoV-2/FLU/RSV testing.  Fact Sheet for  Patients: EntrepreneurPulse.com.au  Fact Sheet for Healthcare Providers: IncredibleEmployment.be  This test is not yet approved or cleared by the [place] FDA and has been authorized for detection and/or diagnosis of SARS-CoV-2 by FDA under an Emergency Use Authorization (EUA). This EUA will remain in effect (meaning this test can be used) for the duration of the COVID-19 declaration under Section 564(b)(1) of the Act, 21 U.S.C. section 360bbb-3(b)(1), unless the authorization is terminated or revoked.  Performed at [facility], [place] [address], [place], [place] [address]      Labs: Basic Metabolic Panel: Recent Labs  Lab 02/05/21 0310 02/06/21 0425 02/07/21 2409 02/08/21 0331 02/09/21 0205 02/10/21 0233 02/11/21 0719  NA 139 136 135  --  138 137  --   K 5.0 4.7 4.4  --  4.3 4.1  --   CL 103 102 102  --  102 102  --   CO2 33* 26 27  --  27 31  --   GLUCOSE 138* 132* 142*  --  136* 127*  --   BUN 27* 38* 45*  --  35* 24*  --   CREATININE 1.66* 1.74* 1.48* 1.16 0.95 0.78 0.85  CALCIUM 8.8* 8.5* 8.5*  --  9.2 9.2  --   MG  --  1.7  --   --   --   --   --   PHOS  --  3.5  --   --   --   --   --    Liver Function Tests: Recent Labs  Lab 02/06/21 0425  AST 18  ALT 7  ALKPHOS 74  BILITOT 0.7  PROT 5.3*  ALBUMIN 2.6*   CBC: Recent Labs  Lab 02/04/21 1829 02/05/21 0310 02/06/21 0425 02/07/21 0209 02/10/21 0233  WBC 11.0* 8.8 6.7 5.4 5.6  HGB 11.3* 9.8* 9.0* 8.6* 9.0*  HCT 36.9* 32.2* 31.0* 28.6* 30.4*  MCV 95.8 96.7 97.5 96.9 96.8  PLT 139* 108* 88* 96* 158   CBG: Recent Labs  Lab 02/10/21 1105 02/10/21 1611 02/11/21 0003 02/11/21 0603 02/11/21 1226  GLUCAP 216* 183* 153* 136* 115*   Hgb A1c No results for input(s): HGBA1C in the last 72 hours. Lipid Profile No results for input(s): CHOL, HDL, LDLCALC, TRIG, CHOLHDL, LDLDIRECT in the last 72 hours. Thyroid function studies No results for input(s):  TSH, T4TOTAL, T3FREE, THYROIDAB in the last 72 hours.  Invalid input(s): FREET3 Urinalysis    Component Value Date/Time   COLORURINE YELLOW 02/04/2021 1037   APPEARANCEUR HAZY (A) 02/04/2021 1037   LABSPEC 1.011 02/04/2021 1037   PHURINE 6.0 02/04/2021 1037   GLUCOSEU NEGATIVE 02/04/2021 1037   HGBUR NEGATIVE 02/04/2021 1037   BILIRUBINUR NEGATIVE 02/04/2021 1037   KETONESUR NEGATIVE 02/04/2021 1037   PROTEINUR 100 (A) 02/04/2021 1037   NITRITE NEGATIVE 02/04/2021 1037   LEUKOCYTESUR LARGE (A) 02/04/2021 1037    FURTHER DISCHARGE INSTRUCTIONS:   Get Medicines reviewed and adjusted: Please take all your medications with you for your next visit with your Primary MD   Laboratory/radiological data: Please request your Primary MD to go over all hospital tests and procedure/radiological results at the follow up, please ask your Primary MD to get all Hospital records sent to his/her office.   In some cases, they will be blood work, cultures and biopsy results pending at the time of your discharge. Please request that your primary care M.D. goes through all the records of your hospital data and follows up on these results.   Also Note the following: If you experience worsening of your admission symptoms, develop shortness of breath, life threatening emergency, suicidal or homicidal thoughts you must seek medical attention immediately by calling 911 or calling your MD immediately  if symptoms less severe.   You must read complete instructions/literature along with all the possible adverse reactions/side effects for all the Medicines you take and that have been prescribed to you. Take any new Medicines after you have completely understood and accpet all the possible adverse reactions/side effects.    Do not drive when taking Pain medications or sleeping medications (Benzodaizepines)   Do not take more than prescribed Pain, Sleep and Anxiety Medications. It is not advisable to combine  anxiety,sleep and pain medications without talking with your primary care practitioner   Special Instructions: If you have smoked or chewed Tobacco  in the last 2 yrs please stop smoking, stop any regular Alcohol  and or any Recreational drug use.  Wear Seat belts while driving.   Please note: You were cared for by a hospitalist during your hospital stay. Once you are discharged, your primary care physician will handle any further medical issues. Please note that NO REFILLS for any discharge medications will be authorized once you are discharged, as it is imperative that you return to your primary care physician (or establish a relationship with a primary care physician if you do not have one) for your post hospital discharge needs so that they can reassess your need for medications and monitor your lab values.  Time coordinating discharge: 40 minutes  SIGNED:  Marzetta Board, MD, PhD 02/11/2021, 1:40 PM

## 2021-02-11 NOTE — Progress Notes (Signed)
Right lower extremity venous duplex has been completed. Preliminary results can be found in CV Proc through chart review.   02/11/21 1:06 PM Brandon [name] RVT

## 2021-02-11 NOTE — TOC Progression Note (Signed)
Transition of Care [facility]) - Progression Note    Patient Details  Name: Brandon [name] MRN: [id number] Date of Birth: 1946/02/26  Transition of Care [facility]) CM/SW Contact  Milinda Antis, LCSWA Phone Number: 02/11/2021, 11:31 AM  Clinical Narrative:     08:55-  CSW contacted [name] with [name] to inquire about whether they are able to accept the patient if he is cleared today.  The facility can accept.    The patient's insurance authorization period ends tomorrow.    Pending: Medical clearance    Expected Discharge Plan: Skilled Nursing Facility Barriers to Discharge: Barriers Resolved  Expected Discharge Plan and Services Expected Discharge Plan: [place]         Expected Discharge Date: 02/08/21                                     Social Determinants of Health (SDOH) Interventions    Readmission Risk Interventions Readmission Risk Prevention Plan 10/06/2019 10/06/2019  Transportation Screening - Complete  PCP or Specialist Appt within 3-5 Days Complete -  [place] or [place] - Complete  Social Work Consult for [place] Planning/Counseling - Complete  Palliative Care Screening - Not Applicable  Medication Review [employer]) - Complete

## 2021-03-15 ENCOUNTER — Other Ambulatory Visit: Payer: Self-pay

## 2021-03-15 ENCOUNTER — Inpatient Hospital Stay (HOSPITAL_COMMUNITY)
Admission: EM | Admit: 2021-03-15 | Discharge: 2021-03-19 | DRG: 689 | Disposition: A | Discharge status: Skilled Nursing Facility | Payer: Medicare Other | Source: Skilled Nursing Facility | Attending: Internal Medicine | Admitting: Internal Medicine

## 2021-03-15 ENCOUNTER — Observation Stay (HOSPITAL_COMMUNITY): Payer: Medicare Other

## 2021-03-15 ENCOUNTER — Encounter (HOSPITAL_COMMUNITY): Payer: Self-pay

## 2021-03-15 ENCOUNTER — Emergency Department (HOSPITAL_COMMUNITY): Payer: Medicare Other

## 2021-03-15 DIAGNOSIS — L89313 Pressure ulcer of right buttock, stage 3: Secondary | ICD-10-CM | POA: Diagnosis present

## 2021-03-15 DIAGNOSIS — R531 Weakness: Secondary | ICD-10-CM

## 2021-03-15 DIAGNOSIS — W19XXXD Unspecified fall, subsequent encounter: Secondary | ICD-10-CM | POA: Diagnosis present

## 2021-03-15 DIAGNOSIS — Z87891 Personal history of nicotine dependence: Secondary | ICD-10-CM

## 2021-03-15 DIAGNOSIS — E669 Obesity, unspecified: Secondary | ICD-10-CM | POA: Diagnosis present

## 2021-03-15 DIAGNOSIS — Z85038 Personal history of other malignant neoplasm of large intestine: Secondary | ICD-10-CM | POA: Diagnosis present

## 2021-03-15 DIAGNOSIS — I110 Hypertensive heart disease with heart failure: Secondary | ICD-10-CM | POA: Diagnosis present

## 2021-03-15 DIAGNOSIS — S72401D Unspecified fracture of lower end of right femur, subsequent encounter for closed fracture with routine healing: Secondary | ICD-10-CM

## 2021-03-15 DIAGNOSIS — N401 Enlarged prostate with lower urinary tract symptoms: Secondary | ICD-10-CM | POA: Diagnosis present

## 2021-03-15 DIAGNOSIS — I480 Paroxysmal atrial fibrillation: Secondary | ICD-10-CM | POA: Diagnosis present

## 2021-03-15 DIAGNOSIS — K7031 Alcoholic cirrhosis of liver with ascites: Secondary | ICD-10-CM | POA: Diagnosis present

## 2021-03-15 DIAGNOSIS — Z7984 Long term (current) use of oral hypoglycemic drugs: Secondary | ICD-10-CM

## 2021-03-15 DIAGNOSIS — G894 Chronic pain syndrome: Secondary | ICD-10-CM | POA: Diagnosis present

## 2021-03-15 DIAGNOSIS — Z6841 Body Mass Index (BMI) 40.0 and over, adult: Secondary | ICD-10-CM

## 2021-03-15 DIAGNOSIS — K760 Fatty (change of) liver, not elsewhere classified: Secondary | ICD-10-CM | POA: Diagnosis present

## 2021-03-15 DIAGNOSIS — S31000A Unspecified open wound of lower back and pelvis without penetration into retroperitoneum, initial encounter: Secondary | ICD-10-CM

## 2021-03-15 DIAGNOSIS — L89152 Pressure ulcer of sacral region, stage 2: Secondary | ICD-10-CM | POA: Diagnosis present

## 2021-03-15 DIAGNOSIS — N390 Urinary tract infection, site not specified: Secondary | ICD-10-CM | POA: Diagnosis not present

## 2021-03-15 DIAGNOSIS — Z20822 Contact with and (suspected) exposure to covid-19: Secondary | ICD-10-CM | POA: Diagnosis present

## 2021-03-15 DIAGNOSIS — Z7901 Long term (current) use of anticoagulants: Secondary | ICD-10-CM

## 2021-03-15 DIAGNOSIS — E119 Type 2 diabetes mellitus without complications: Secondary | ICD-10-CM

## 2021-03-15 DIAGNOSIS — Z79899 Other long term (current) drug therapy: Secondary | ICD-10-CM

## 2021-03-15 DIAGNOSIS — Z951 Presence of aortocoronary bypass graft: Secondary | ICD-10-CM

## 2021-03-15 DIAGNOSIS — E860 Dehydration: Secondary | ICD-10-CM | POA: Diagnosis present

## 2021-03-15 DIAGNOSIS — B9689 Other specified bacterial agents as the cause of diseases classified elsewhere: Secondary | ICD-10-CM | POA: Diagnosis present

## 2021-03-15 DIAGNOSIS — Z9049 Acquired absence of other specified parts of digestive tract: Secondary | ICD-10-CM

## 2021-03-15 DIAGNOSIS — N179 Acute kidney failure, unspecified: Secondary | ICD-10-CM | POA: Diagnosis present

## 2021-03-15 DIAGNOSIS — D649 Anemia, unspecified: Secondary | ICD-10-CM | POA: Diagnosis present

## 2021-03-15 DIAGNOSIS — I5032 Chronic diastolic (congestive) heart failure: Secondary | ICD-10-CM | POA: Diagnosis present

## 2021-03-15 DIAGNOSIS — Z800 Family history of malignant neoplasm of digestive organs: Secondary | ICD-10-CM

## 2021-03-15 DIAGNOSIS — L89322 Pressure ulcer of left buttock, stage 2: Secondary | ICD-10-CM | POA: Diagnosis present

## 2021-03-15 DIAGNOSIS — Z66 Do not resuscitate: Secondary | ICD-10-CM | POA: Diagnosis present

## 2021-03-15 DIAGNOSIS — M542 Cervicalgia: Secondary | ICD-10-CM

## 2021-03-15 DIAGNOSIS — I2510 Atherosclerotic heart disease of native coronary artery without angina pectoris: Secondary | ICD-10-CM | POA: Diagnosis present

## 2021-03-15 DIAGNOSIS — Z8249 Family history of ischemic heart disease and other diseases of the circulatory system: Secondary | ICD-10-CM

## 2021-03-15 DIAGNOSIS — I4891 Unspecified atrial fibrillation: Secondary | ICD-10-CM | POA: Diagnosis present

## 2021-03-15 DIAGNOSIS — L8990 Pressure ulcer of unspecified site, unspecified stage: Secondary | ICD-10-CM | POA: Insufficient documentation

## 2021-03-15 LAB — I-STAT CHEM 8, ED
BUN: 56 mg/dL — ABNORMAL HIGH (ref 8–23)
Calcium, Ion: 1.2 mmol/L (ref 1.15–1.40)
Chloride: 99 mmol/L (ref 98–111)
Creatinine, Ser: 1.4 mg/dL — ABNORMAL HIGH (ref 0.61–1.24)
Glucose, Bld: 130 mg/dL — ABNORMAL HIGH (ref 70–99)
HCT: 44 % (ref 39.0–52.0)
Hemoglobin: 15 g/dL (ref 13.0–17.0)
Potassium: 4.8 mmol/L (ref 3.5–5.1)
Sodium: 135 mmol/L (ref 135–145)
TCO2: 32 mmol/L (ref 22–32)

## 2021-03-15 LAB — I-STAT VENOUS BLOOD GAS, ED
Acid-Base Excess: 7 mmol/L — ABNORMAL HIGH (ref 0.0–2.0)
Bicarbonate: 33.8 mmol/L — ABNORMAL HIGH (ref 20.0–28.0)
Calcium, Ion: 1.21 mmol/L (ref 1.15–1.40)
HCT: 44 % (ref 39.0–52.0)
Hemoglobin: 15 g/dL (ref 13.0–17.0)
O2 Saturation: 99 %
Potassium: 4.8 mmol/L (ref 3.5–5.1)
Sodium: 136 mmol/L (ref 135–145)
TCO2: 35 mmol/L — ABNORMAL HIGH (ref 22–32)
pCO2, Ven: 55.1 mmHg (ref 44.0–60.0)
pH, Ven: 7.396 (ref 7.250–7.430)
pO2, Ven: 140 mmHg — ABNORMAL HIGH (ref 32.0–45.0)

## 2021-03-15 LAB — URINALYSIS, ROUTINE W REFLEX MICROSCOPIC
Bilirubin Urine: NEGATIVE
Glucose, UA: NEGATIVE mg/dL
Ketones, ur: NEGATIVE mg/dL
Nitrite: NEGATIVE
Protein, ur: 100 mg/dL — AB
RBC / HPF: >50 RBC/hpf — ABNORMAL HIGH (ref 0–5)
Specific Gravity, Urine: 1.012 (ref 1.005–1.030)
WBC, UA: >50 WBC/hpf — ABNORMAL HIGH (ref 0–5)
pH: 8 (ref 5.0–8.0)

## 2021-03-15 LAB — CBC WITH DIFFERENTIAL/PLATELET
Abs Immature Granulocytes: 0.06 10*3/uL (ref 0.00–0.07)
Basophils Absolute: 0.1 10*3/uL (ref 0.0–0.1)
Basophils Relative: 1 %
Eosinophils Absolute: 0.5 10*3/uL (ref 0.0–0.5)
Eosinophils Relative: 4 %
HCT: 45 % (ref 39.0–52.0)
Hemoglobin: 13.4 g/dL (ref 13.0–17.0)
Immature Granulocytes: 1 %
Lymphocytes Relative: 9 %
Lymphs Abs: 0.9 10*3/uL (ref 0.7–4.0)
MCH: 28.1 pg (ref 26.0–34.0)
MCHC: 29.8 g/dL — ABNORMAL LOW (ref 30.0–36.0)
MCV: 94.3 fL (ref 80.0–100.0)
Monocytes Absolute: 1 10*3/uL (ref 0.1–1.0)
Monocytes Relative: 10 %
Neutro Abs: 7.8 10*3/uL — ABNORMAL HIGH (ref 1.7–7.7)
Neutrophils Relative %: 75 %
Platelets: 231 10*3/uL (ref 150–400)
RBC: 4.77 MIL/uL (ref 4.22–5.81)
RDW: 14.2 % (ref 11.5–15.5)
WBC: 10.3 10*3/uL (ref 4.0–10.5)
nRBC: 0 % (ref 0.0–0.2)

## 2021-03-15 LAB — APTT: aPTT: 25 seconds (ref 24–36)

## 2021-03-15 LAB — COMPREHENSIVE METABOLIC PANEL
ALT: 8 U/L (ref 0–44)
AST: 12 U/L — ABNORMAL LOW (ref 15–41)
Albumin: 2.7 g/dL — ABNORMAL LOW (ref 3.5–5.0)
Alkaline Phosphatase: 104 U/L (ref 38–126)
Anion gap: 11 (ref 5–15)
BUN: 44 mg/dL — ABNORMAL HIGH (ref 8–23)
CO2: 27 mmol/L (ref 22–32)
Calcium: 10 mg/dL (ref 8.9–10.3)
Chloride: 97 mmol/L — ABNORMAL LOW (ref 98–111)
Creatinine, Ser: 1.53 mg/dL — ABNORMAL HIGH (ref 0.61–1.24)
GFR, Estimated: 47 mL/min — ABNORMAL LOW (ref >60)
Glucose, Bld: 131 mg/dL — ABNORMAL HIGH (ref 70–99)
Potassium: 4.6 mmol/L (ref 3.5–5.1)
Sodium: 135 mmol/L (ref 135–145)
Total Bilirubin: 0.8 mg/dL (ref 0.3–1.2)
Total Protein: 6.5 g/dL (ref 6.5–8.1)

## 2021-03-15 LAB — RESP PANEL BY RT-PCR (FLU A&B, COVID) ARPGX2
Influenza A by PCR: NEGATIVE
Influenza B by PCR: NEGATIVE
SARS Coronavirus 2 by RT PCR: NEGATIVE

## 2021-03-15 LAB — LACTIC ACID, PLASMA
Lactic Acid, Venous: 2.2 mmol/L (ref 0.5–1.9)
Lactic Acid, Venous: 2 mmol/L (ref 0.5–1.9)

## 2021-03-15 LAB — PROTIME-INR
INR: 1.2 (ref 0.8–1.2)
Prothrombin Time: 14.7 seconds (ref 11.4–15.2)

## 2021-03-15 LAB — TSH: TSH: 1.028 u[IU]/mL (ref 0.350–4.500)

## 2021-03-15 LAB — GLUCOSE, CAPILLARY: Glucose-Capillary: 129 mg/dL — ABNORMAL HIGH (ref 70–99)

## 2021-03-15 LAB — BRAIN NATRIURETIC PEPTIDE: B Natriuretic Peptide: 131.1 pg/mL — ABNORMAL HIGH (ref 0.0–100.0)

## 2021-03-15 LAB — AMMONIA: Ammonia: 97 umol/L — ABNORMAL HIGH (ref 9–35)

## 2021-03-15 LAB — CBG MONITORING, ED: Glucose-Capillary: 130 mg/dL — ABNORMAL HIGH (ref 70–99)

## 2021-03-15 IMAGING — DX DG SACRUM/COCCYX 2+V
3 series · 3 of 3 positions shown · non-contrast
Comparison: [DATE]

CLINICAL DATA: Wound

EXAM:
SACRUM AND COCCYX - 2+ VIEW

[sacrum ap]
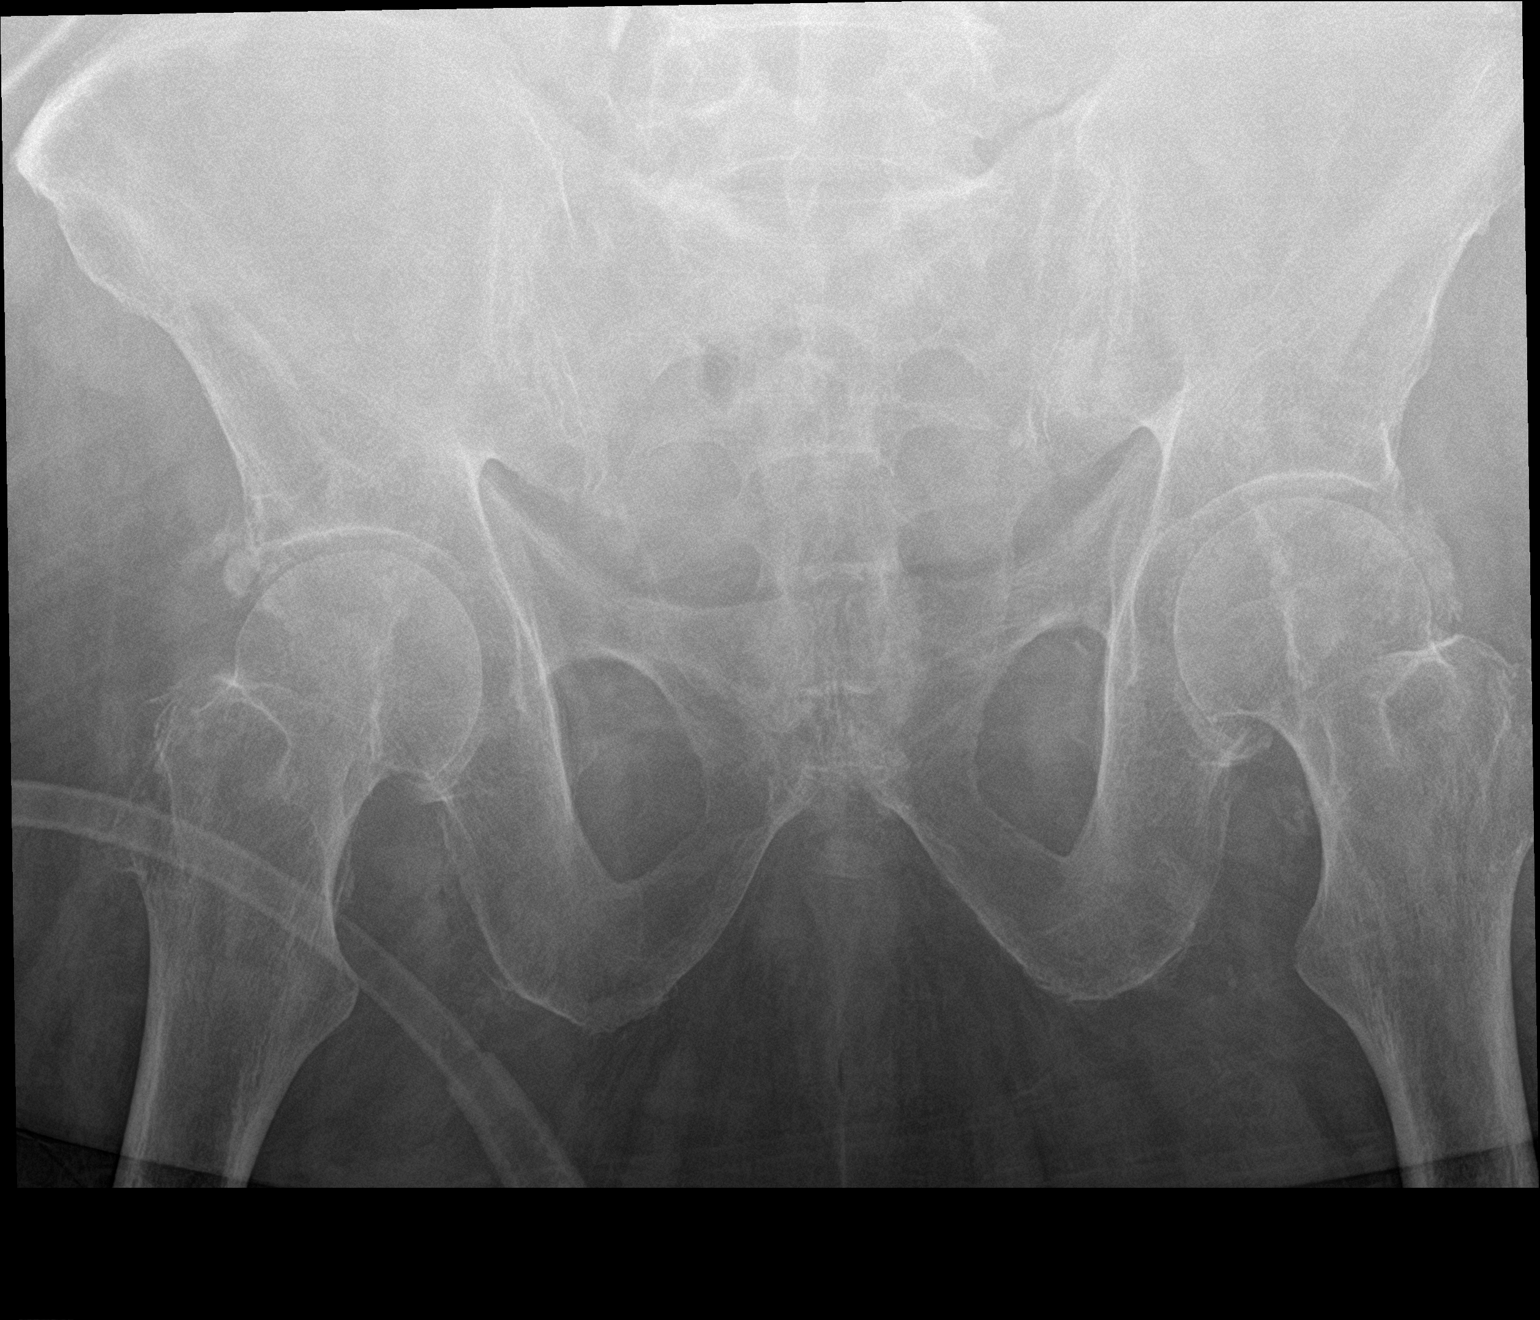

[coccyx ap]
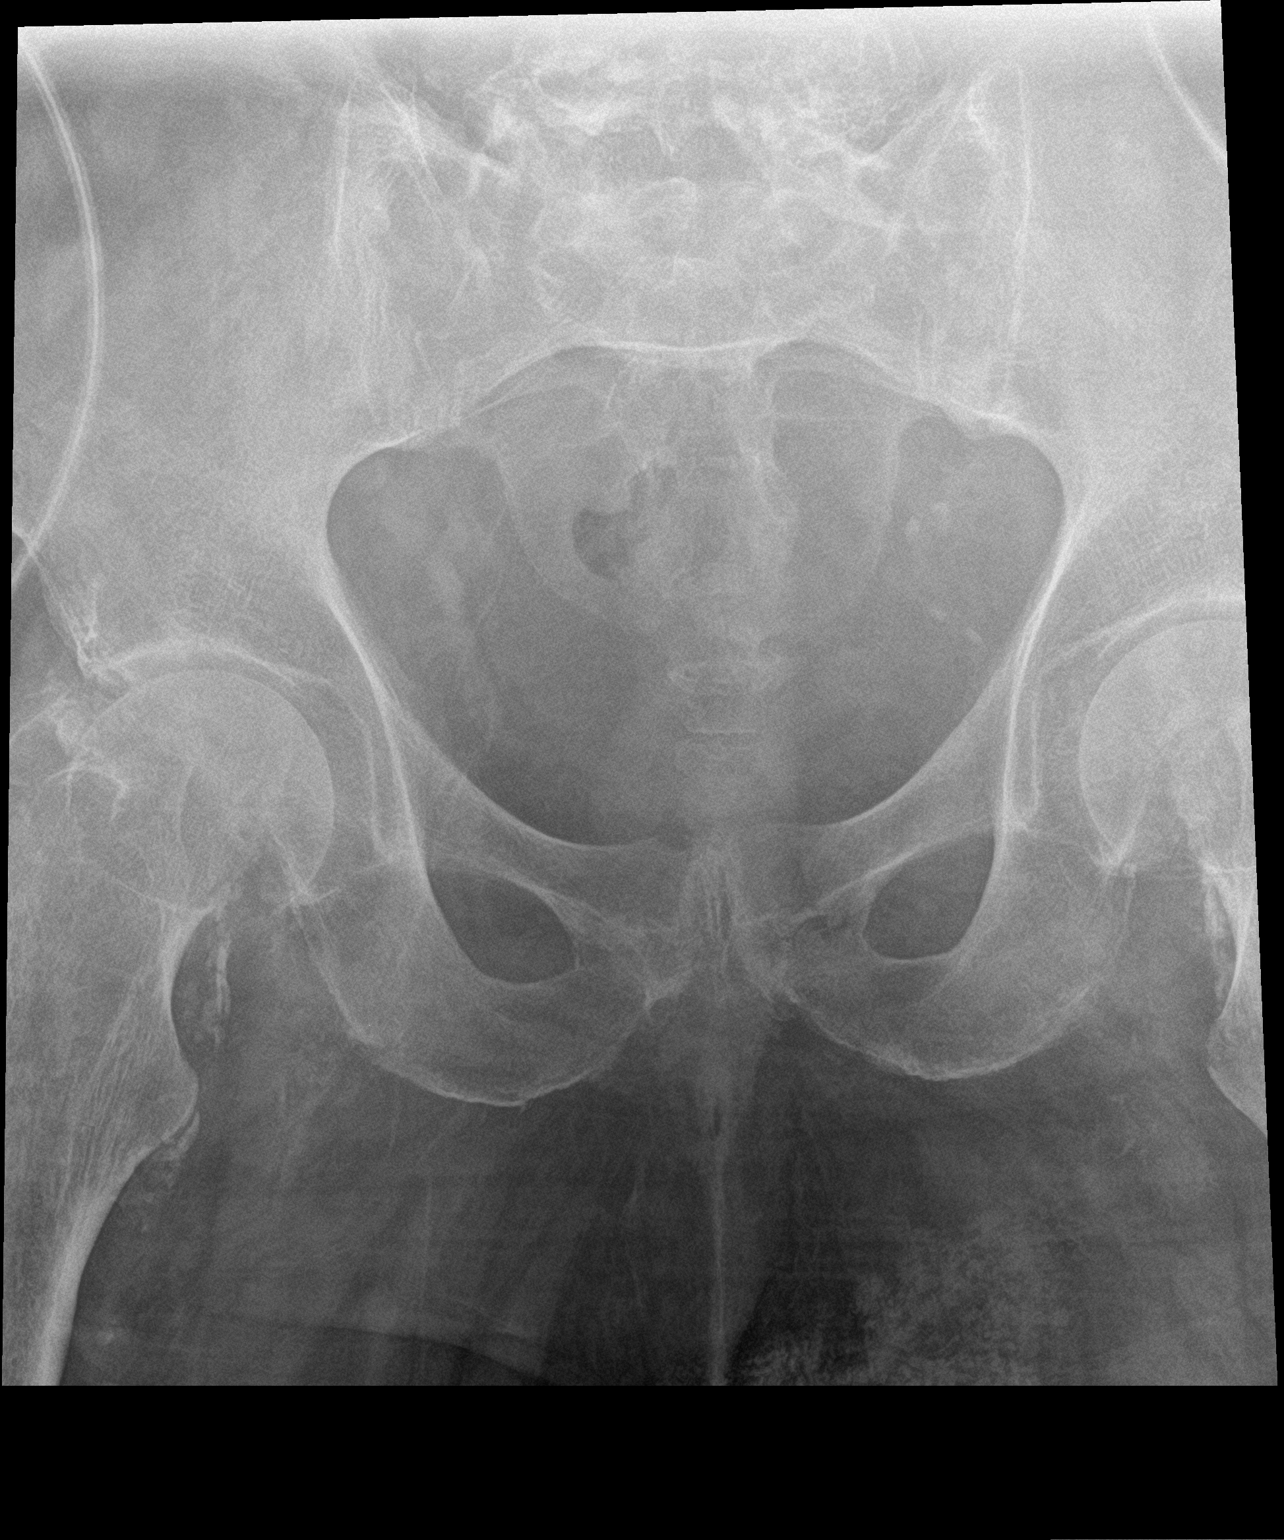

[sacrum lat]
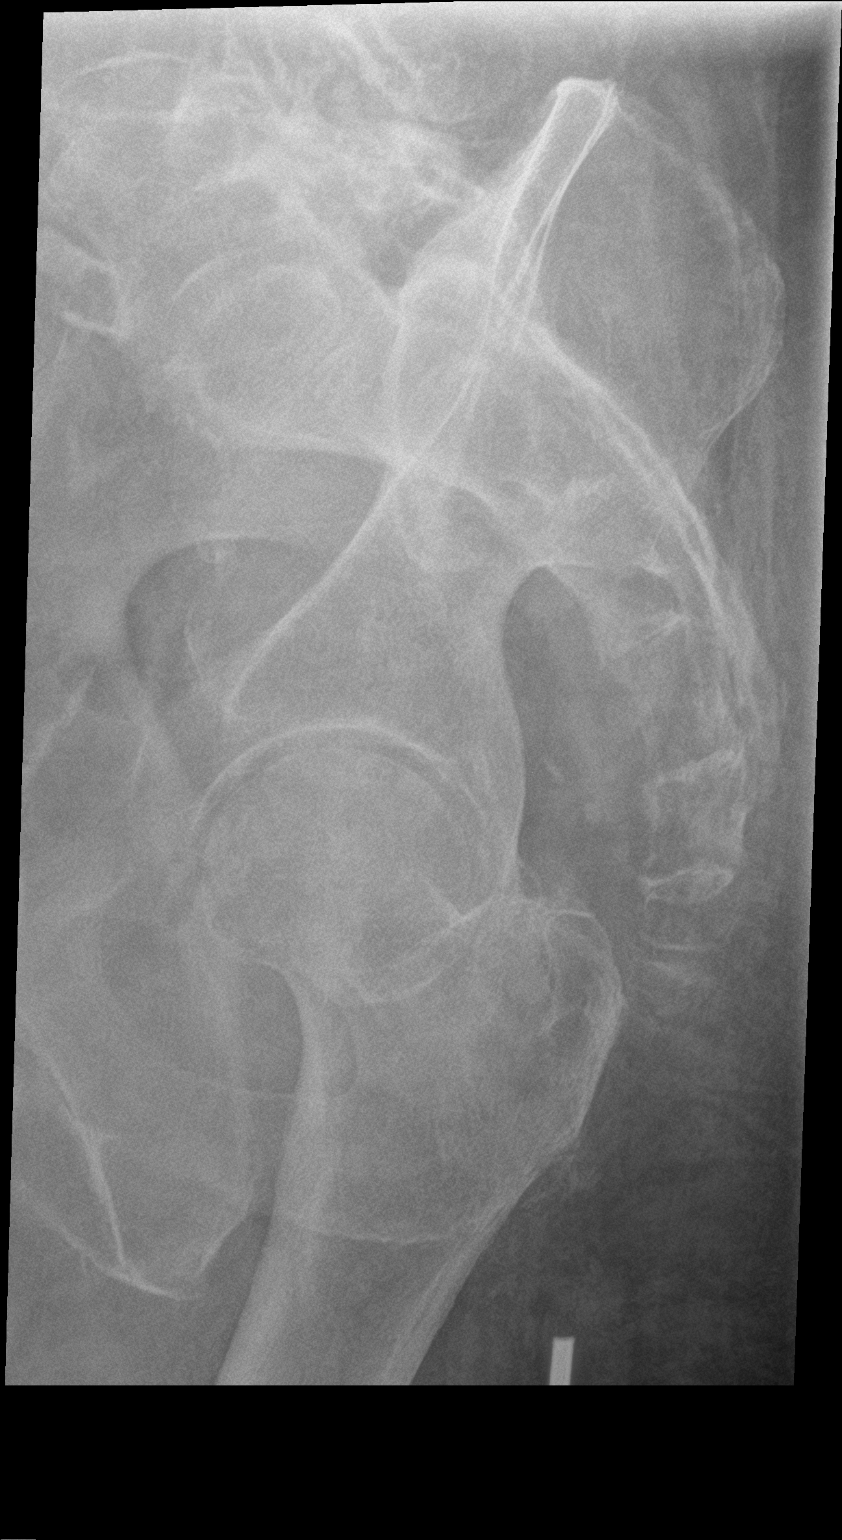

[3 of 3 positions shown; findings below may reference images not displayed]

FINDINGS: Pubic symphysis and rami are intact. No SI joint widening. No
fracture or malalignment. No definitive osseous destructive change.
IMPRESSION: No definite acute osseous abnormality. Cross-sectional imaging
follow-up if continued suspicion for infection

## 2021-03-15 IMAGING — DX DG CHEST 1V
1 series · 1 of 1 positions shown · non-contrast
Comparison: [DATE]

CLINICAL DATA: Sacral osteomyelitis, weakness

EXAM:
CHEST  1 VIEW

[chest pa]
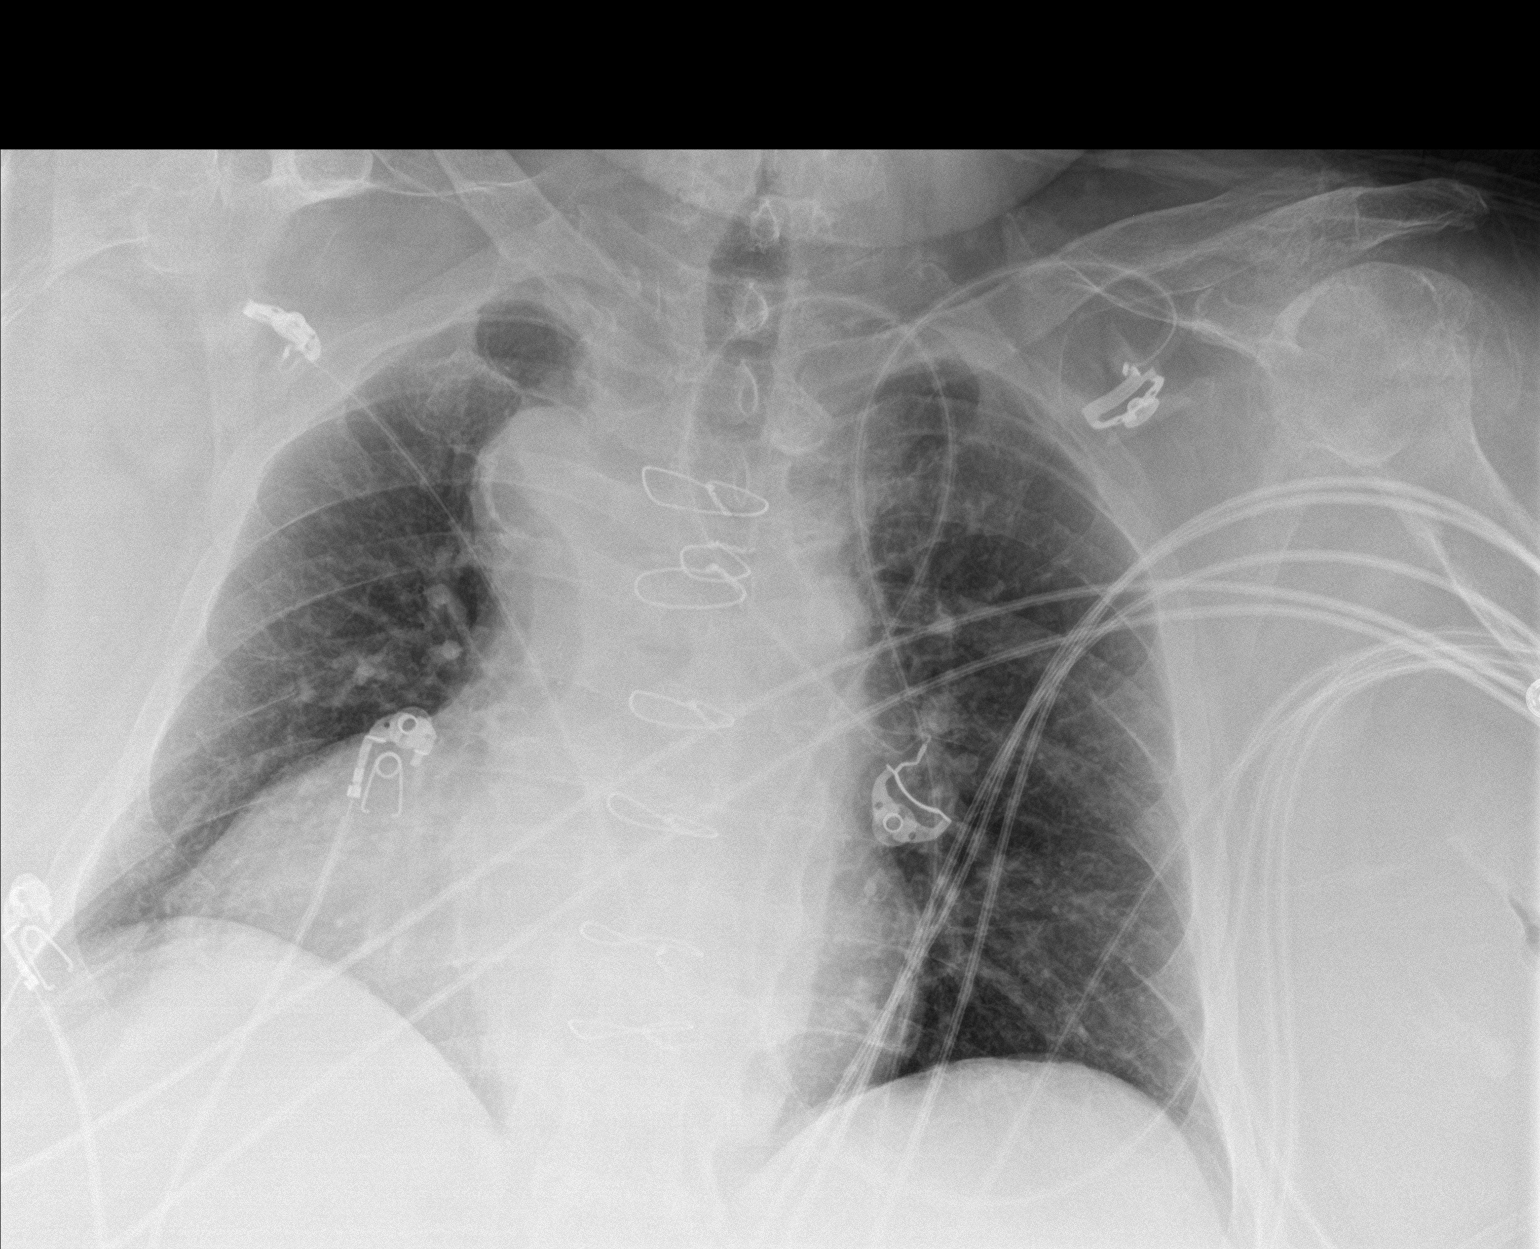

[1 of 1 positions shown; findings below may reference images not displayed]

FINDINGS: Single frontal view of the chest demonstrates an unremarkable
cardiac silhouette. Postsurgical changes from median sternotomy.
Stable atherosclerosis of the aorta. No airspace disease, effusion,
or pneumothorax. No acute bony abnormality.
IMPRESSION: 1. Stable chest, no acute process.

## 2021-03-15 IMAGING — DX DG KNEE 1-2V*R*
3 series · 3 of 3 positions shown · non-contrast
Comparison: [DATE]

CLINICAL DATA: Knee pain

EXAM:
RIGHT KNEE - 1-2 VIEW

[knee ap]
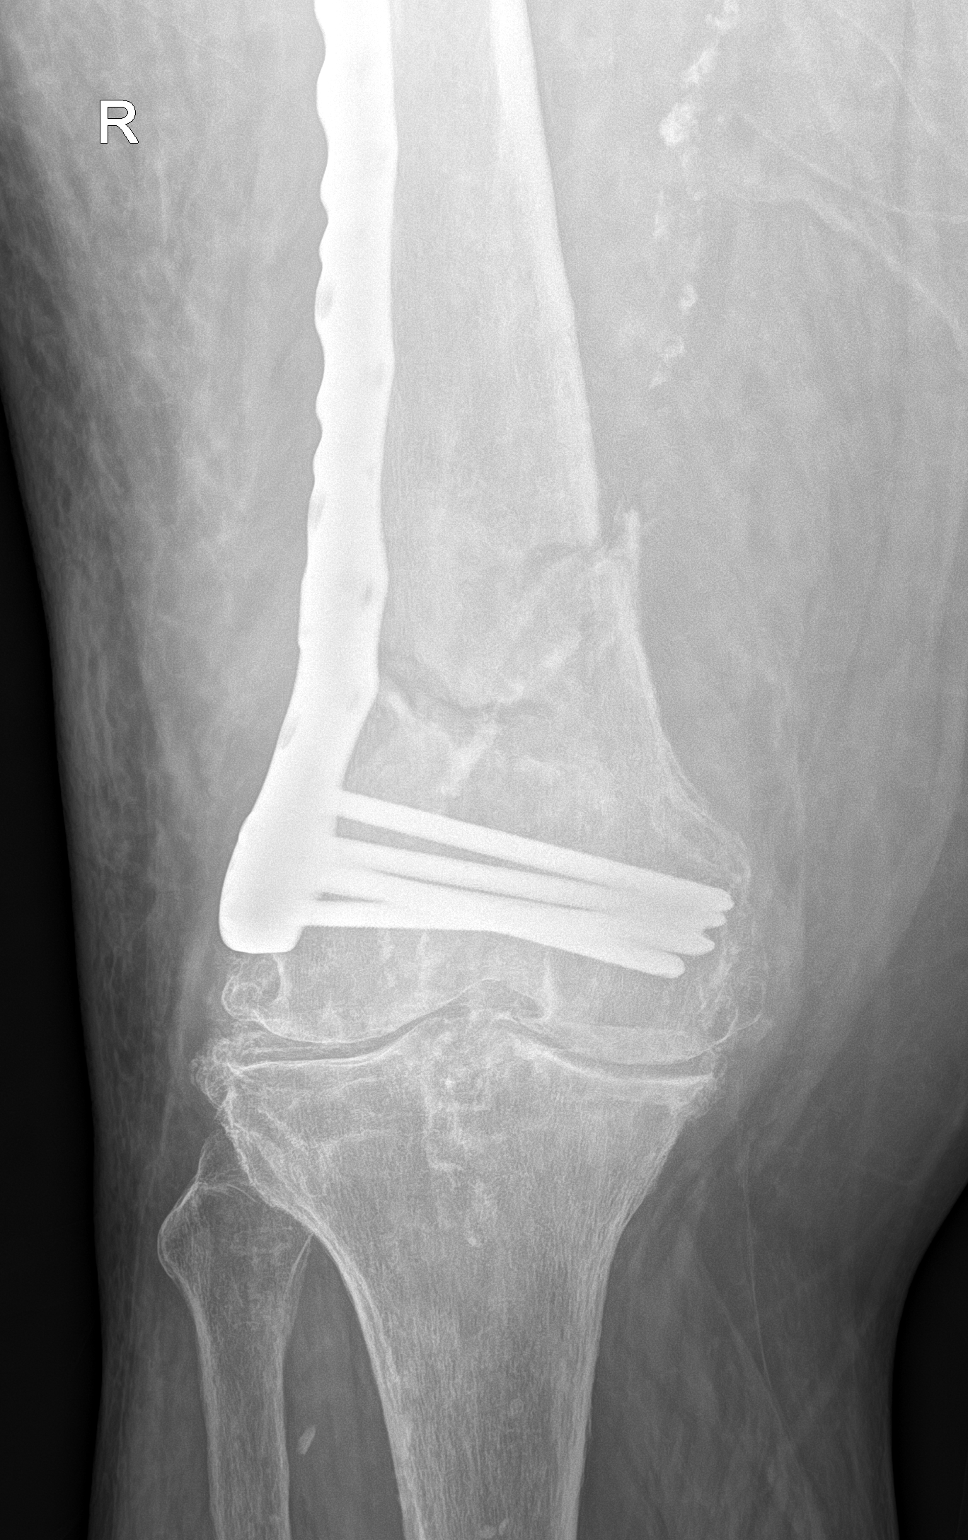

[knee lat (1 of 2)]
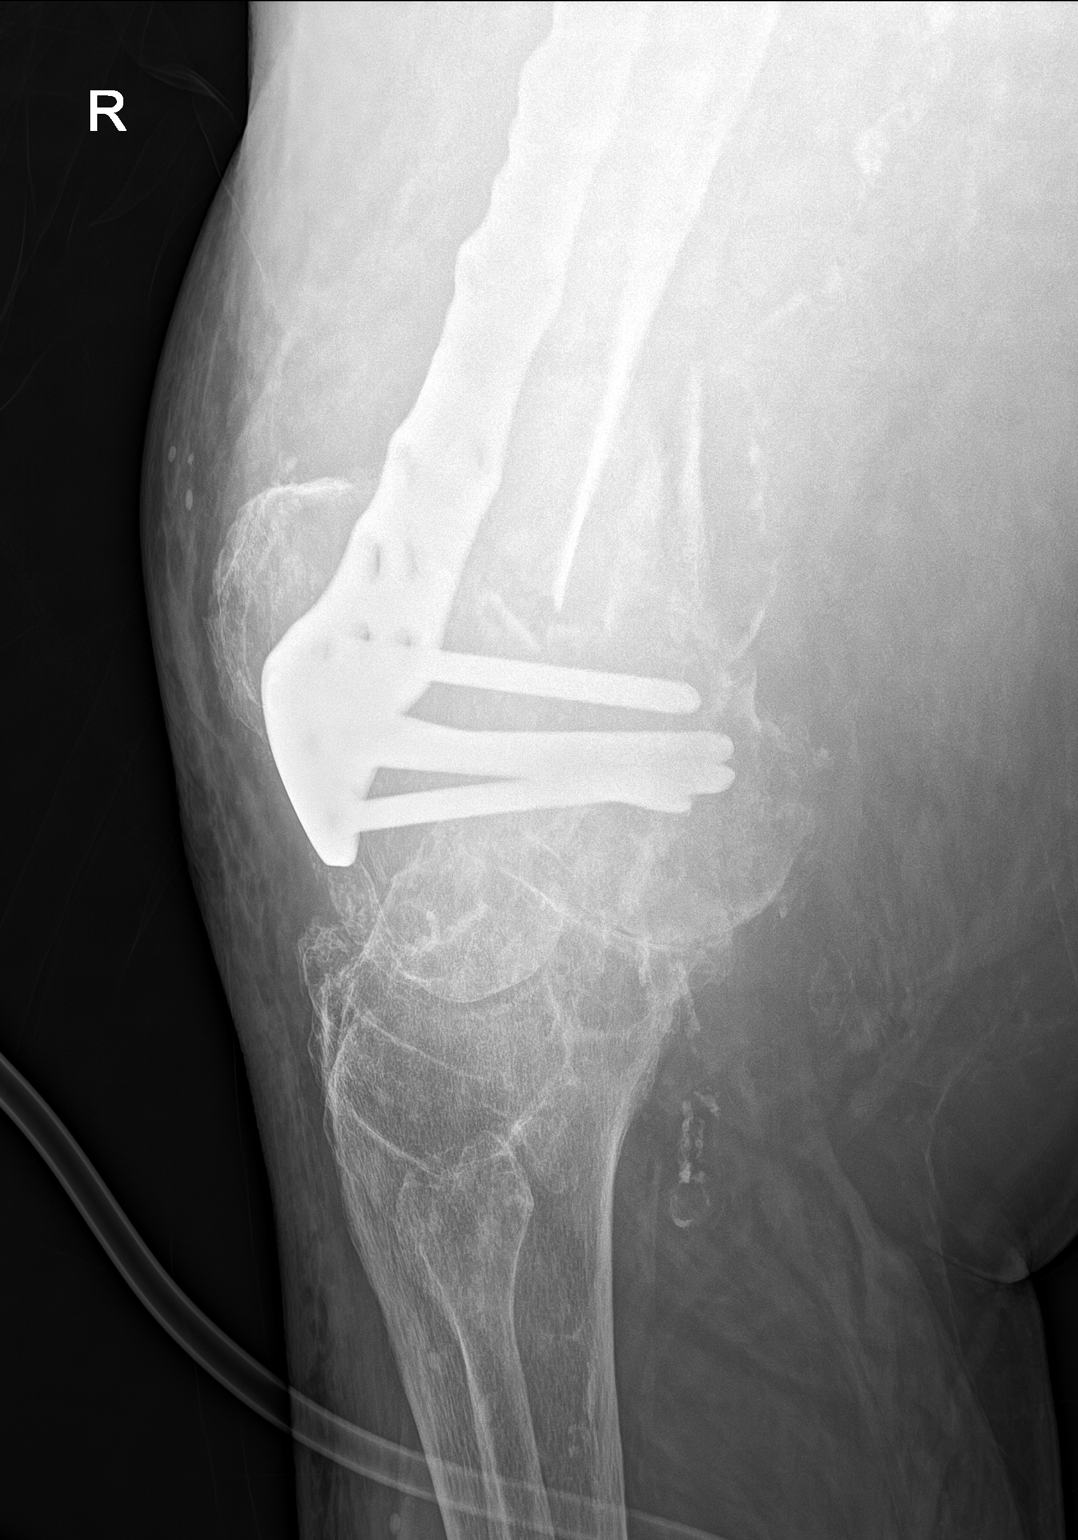

[knee lat (2 of 2)]
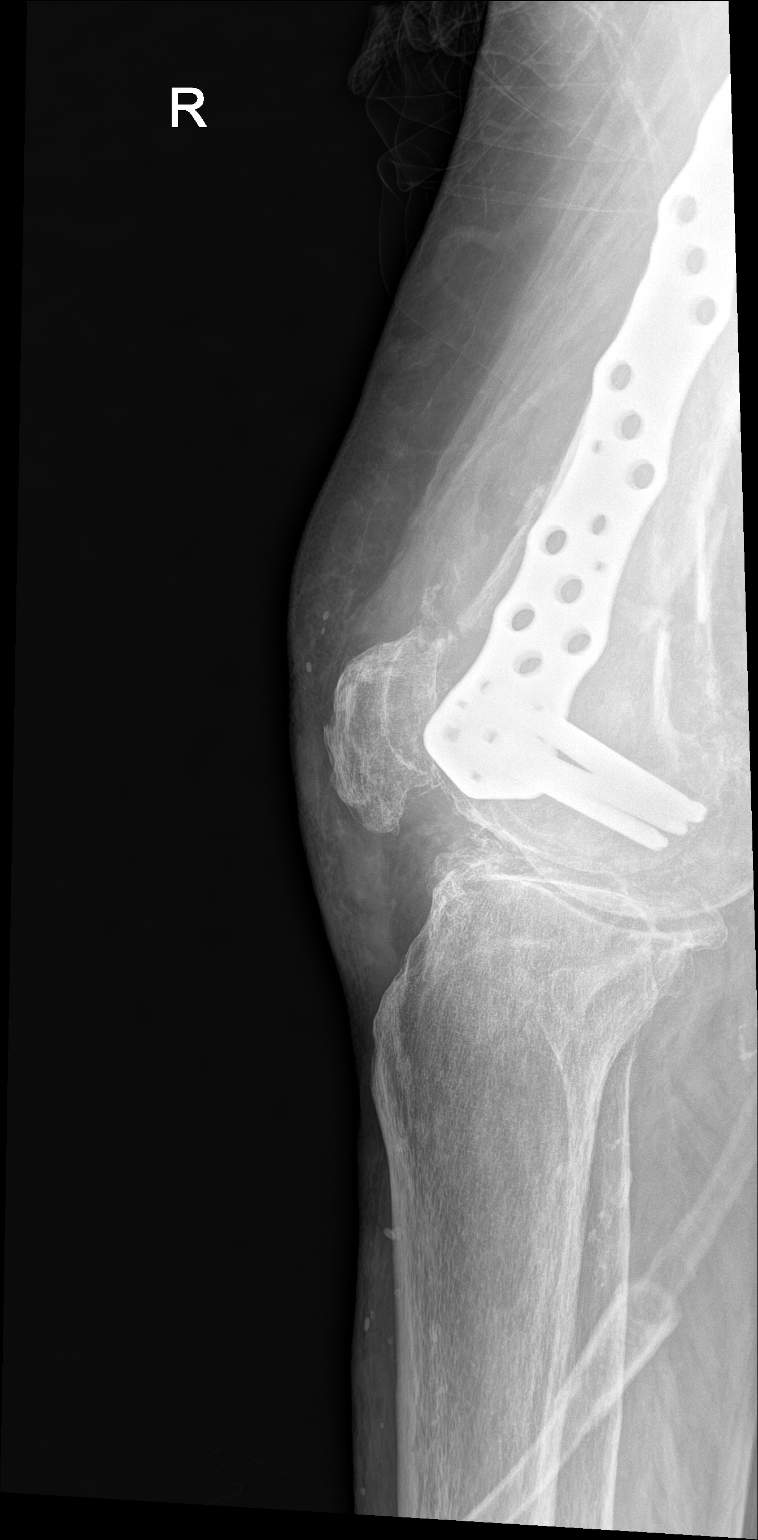

[3 of 3 positions shown; findings below may reference images not displayed]

FINDINGS: Partially visualized surgical plate and fixating screws across
comminuted extra-articular distal femoral fracture with residual
lucency and no abundant callus. Patellar fracture with less apparent
lucency suggests some interval healing. Diffuse soft tissue swelling
with effusion. Advanced arthritis of the knee.
IMPRESSION: 1. Partially visualized surgical plate and fixating screws in the
distal femur across comminuted distal femoral fracture without much
interval bridging callus.
2. Redemonstrated patella fracture with less visible lucency
suggestive of some healing
3. Arthritis of the knee

## 2021-03-15 MED ORDER — SODIUM CHLORIDE 0.9 % IV SOLN
1.0000 g | INTRAVENOUS | Status: DC
  Administered 2021-03-16 – 2021-03-17 (×2): 1 g via INTRAVENOUS
  Filled 2021-03-15 (×2): qty 10

## 2021-03-15 MED ORDER — TAMSULOSIN HCL 0.4 MG PO CAPS
0.4000 mg | ORAL_CAPSULE | Freq: Every day | ORAL | Status: DC
  Administered 2021-03-15 – 2021-03-18 (×4): 0.4 mg via ORAL
  Filled 2021-03-15 (×4): qty 1

## 2021-03-15 MED ORDER — INSULIN ASPART 100 UNIT/ML IJ SOLN
0.0000 [IU] | Freq: Three times a day (TID) | INTRAMUSCULAR | Status: DC
  Administered 2021-03-16 (×2): 2 [IU] via SUBCUTANEOUS
  Administered 2021-03-17 – 2021-03-18 (×3): 1 [IU] via SUBCUTANEOUS

## 2021-03-15 MED ORDER — PANTOPRAZOLE SODIUM 40 MG PO TBEC
40.0000 mg | DELAYED_RELEASE_TABLET | Freq: Every day | ORAL | Status: DC
  Administered 2021-03-16 – 2021-03-19 (×4): 40 mg via ORAL
  Filled 2021-03-15 (×4): qty 1

## 2021-03-15 MED ORDER — FINASTERIDE 5 MG PO TABS
5.0000 mg | ORAL_TABLET | Freq: Every day | ORAL | Status: DC
Start: 2021-03-16 — End: 2021-03-19
  Administered 2021-03-16 – 2021-03-19 (×4): 5 mg via ORAL
  Filled 2021-03-15 (×4): qty 1

## 2021-03-15 MED ORDER — HYDROCODONE-ACETAMINOPHEN 5-325 MG PO TABS
1.0000 | ORAL_TABLET | ORAL | Status: DC | PRN
  Administered 2021-03-16 – 2021-03-19 (×4): 1 via ORAL
  Filled 2021-03-15 (×4): qty 1

## 2021-03-15 MED ORDER — SENNA 8.6 MG PO TABS
1.0000 | ORAL_TABLET | Freq: Two times a day (BID) | ORAL | Status: DC
  Administered 2021-03-15 – 2021-03-17 (×3): 8.6 mg via ORAL
  Filled 2021-03-15 (×3): qty 1

## 2021-03-15 MED ORDER — ONDANSETRON HCL 4 MG PO TABS
4.0000 mg | ORAL_TABLET | Freq: Four times a day (QID) | ORAL | Status: DC | PRN
  Administered 2021-03-18: 4 mg via ORAL
  Filled 2021-03-15: qty 1

## 2021-03-15 MED ORDER — SODIUM CHLORIDE 0.9 % IV SOLN
1.0000 g | Freq: Once | INTRAVENOUS | Status: AC
  Administered 2021-03-15: 1 g via INTRAVENOUS
  Filled 2021-03-15: qty 10

## 2021-03-15 MED ORDER — PRAVASTATIN SODIUM 10 MG PO TABS
10.0000 mg | ORAL_TABLET | Freq: Every day | ORAL | Status: DC
  Administered 2021-03-15 – 2021-03-18 (×4): 10 mg via ORAL
  Filled 2021-03-15 (×4): qty 1

## 2021-03-15 MED ORDER — ACETAMINOPHEN 650 MG RE SUPP: 650.0000 mg | Freq: Four times a day (QID) | RECTAL | Status: DC | PRN

## 2021-03-15 MED ORDER — ONDANSETRON HCL 4 MG/2ML IJ SOLN: 4.0000 mg | Freq: Four times a day (QID) | INTRAMUSCULAR | Status: DC | PRN

## 2021-03-15 MED ORDER — CARVEDILOL 25 MG PO TABS
25.0000 mg | ORAL_TABLET | Freq: Two times a day (BID) | ORAL | Status: DC
  Administered 2021-03-16 – 2021-03-19 (×7): 25 mg via ORAL
  Filled 2021-03-15 (×7): qty 1

## 2021-03-15 MED ORDER — SODIUM CHLORIDE 0.9 % IV SOLN: 1.0000 g | INTRAVENOUS | Status: DC

## 2021-03-15 MED ORDER — DOCUSATE SODIUM 100 MG PO CAPS
100.0000 mg | ORAL_CAPSULE | Freq: Two times a day (BID) | ORAL | Status: DC
Start: 2021-03-15 — End: 2021-03-18
  Administered 2021-03-15 – 2021-03-17 (×3): 100 mg via ORAL
  Filled 2021-03-15 (×3): qty 1

## 2021-03-15 MED ORDER — SODIUM CHLORIDE 0.9 % IV SOLN: INTRAVENOUS | Status: DC

## 2021-03-15 MED ORDER — SODIUM CHLORIDE 0.9 % IV BOLUS
500.0000 mL | Freq: Once | INTRAVENOUS | Status: AC
  Administered 2021-03-15: 500 mL via INTRAVENOUS

## 2021-03-15 MED ORDER — ENOXAPARIN SODIUM 80 MG/0.8ML IJ SOSY
70.0000 mg | PREFILLED_SYRINGE | INTRAMUSCULAR | Status: DC
  Administered 2021-03-15 – 2021-03-18 (×4): 70 mg via SUBCUTANEOUS
  Filled 2021-03-15: qty 0.8
  Filled 2021-03-15: qty 0.7
  Filled 2021-03-15 (×3): qty 0.8

## 2021-03-15 MED ORDER — ACETAMINOPHEN 325 MG PO TABS
650.0000 mg | ORAL_TABLET | Freq: Four times a day (QID) | ORAL | Status: DC | PRN
  Administered 2021-03-16 – 2021-03-19 (×2): 650 mg via ORAL
  Filled 2021-03-15 (×2): qty 2

## 2021-03-15 NOTE — ED Triage Notes (Signed)
From [place] sp right knee surgery. Site is clean. Pt reports weaknes. Also reports ulcers to back x 1 week.

## 2021-03-15 NOTE — Plan of Care (Signed)

## 2021-03-15 NOTE — ED Provider Notes (Signed)
[facility] EMERGENCY DEPARTMENT Provider Note   CSN: [id number] Arrival date & time: 03/15/21  1500     History Chief Complaint  Patient presents with   Weakness   Wound Check    Brandon [name] is a 75 y.o. male presenting for evaluation of generalized weakness, nausea, vomiting, sacral wounds.  Patient states that the past week he has not been feeling well.  He reports feeling very tired.  He reports intermittent episodes of nausea and vomiting.  He has had wounds of his sacrum for the past week, does not know if he is on any antibiotics.  He reports urinary frequency and dysuria.  He denies headache, cough, chest pain, shortness of breath, abdominal pain, abnormal bowel movements.  He did recently have knee surgery on his right knee about a month ago.  This is why he is in the facility.  He is on a blood thinner, per paperwork this is Lovenox.  Additional history obtained in chart review.  Patient with a history of alcoholic cirrhosis of the liver, anemia, A. fib on anticoagulation, CAD, diabetes, obesity, hypertension, CHF  HPI     Past Medical History:  Diagnosis Date   Anemia 07/2019   Hb 6.7 >> 3 PRBCs >> 8.8 in 07/2019.      Atrial fibrillation ([place])    Cholelithiasis    Cirrhosis of liver ([place])    Presumed due to EtOH as well as fatty liver from morbid obesity.  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/2019   Obesity 07/2019   Portal hypertension ([place])    Tubular adenoma of colon     Patient Active Problem List   Diagnosis Date Noted   Disp supracondyl fx w/o intracondylar extension of right distal femur ([place]) 02/05/2021   Hypomagnesemia 02/04/2021    Fall at home, initial encounter 02/04/2021   Cirrhosis of liver (HCC)    Chronic heart failure with preserved ejection fraction (HFpEF) ([place]) 06/07/2020   Hyperkalemia 06/07/2020   Weakness generalized 06/07/2020   Physical deconditioning 06/06/2020   Hypothermia 05/31/2020   Bullous pemphigoid [id number]   Alcoholic cirrhosis of liver with ascites ([place])    Portal hypertensive gastropathy ([place])    Melena 10/02/2019   Angiodysplasia of stomach with hemorrhage    Gastrointestinal hemorrhage    Atrial fibrillation (HCC)    Chronic pain syndrome    Symptomatic anemia 08/13/2019   Acute upper GI bleed 08/13/2019   Anemia 07/2019   Avulsion of right patellar tendon    Closed displaced fracture of phalanx of toe of right foot 10/06/2018   Obesity BMI over 52 09/30/2018   Patella fracture, extensor mechanism disruption R knee  09/29/2018   History of colon cancer 08/23/2018   CAD (coronary artery disease) 08/23/2018   DM (diabetes mellitus), type 2 ([place]) 08/23/2018   Pyuria 08/23/2018    Past Surgical History:  Procedure Laterality Date   APPLICATION OF WOUND VAC Right 09/30/2018   Procedure: Application Of Wound Vac;  Surgeon: Altamese Marion, MD;  Location: [place];  Service: Orthopedics;  Laterality: Right;   COLONOSCOPY  2010   Removed 2 polyps of unclear type.  Not  performed in [place] N/A 09/17/2019   Procedure: COLONOSCOPY WITH PROPOFOL;  Surgeon: [name], MD;  Location: [place];  Service: Endoscopy;  Laterality: N/A;   CORONARY ARTERY BYPASS GRAFT     in his 42s   ESOPHAGOGASTRODUODENOSCOPY (EGD) WITH PROPOFOL N/A 08/14/2019   Procedure: ESOPHAGOGASTRODUODENOSCOPY (EGD) WITH PROPOFOL;  Surgeon: [name], MD;  Location: [place];  Service: Gastroenterology;  Laterality: N/A;   ESOPHAGOGASTRODUODENOSCOPY (EGD) WITH PROPOFOL N/A 09/17/2019   Procedure: ESOPHAGOGASTRODUODENOSCOPY (EGD) WITH PROPOFOL;  Surgeon: [name], MD;   Location: [place];  Service: Endoscopy;  Laterality: N/A;   ESOPHAGOGASTRODUODENOSCOPY (EGD) WITH PROPOFOL N/A 10/04/2019   Procedure: ESOPHAGOGASTRODUODENOSCOPY (EGD) WITH PROPOFOL;  Surgeon: Mauri Pole, MD;  Location: [facility] ENDOSCOPY;  Service: Endoscopy;  Laterality: N/A;   GIVENS CAPSULE STUDY N/A 09/17/2019   Procedure: GIVENS CAPSULE STUDY;  Surgeon: [name], MD;  Location: [place];  Service: Endoscopy;  Laterality: N/A;   HOT HEMOSTASIS N/A 08/14/2019   Procedure: HOT HEMOSTASIS (ARGON PLASMA COAGULATION/BICAP);  Surgeon: [name], MD;  Location: [facility] ENDOSCOPY;  Service: Gastroenterology;  Laterality: N/A;   HOT HEMOSTASIS N/A 09/17/2019   Procedure: HOT HEMOSTASIS (ARGON PLASMA COAGULATION/BICAP);  Surgeon: [name], MD;  Location: [place];  Service: Endoscopy;  Laterality: N/A;   ORIF FEMUR FRACTURE Right 02/04/2021   Procedure: OPEN REDUCTION INTERNAL FIXATION (ORIF) DISTAL FEMUR FRACTURE;  Surgeon: Shona Needles, MD;  Location: [place];  Service: Orthopedics;  Laterality: Right;   PARTIAL COLECTOMY  2003   To address colon cancer   PATELLAR TENDON REPAIR Right 09/30/2018   Procedure: PATELLA TENDON REPAIR;  Surgeon: Altamese Bellefontaine, MD;  Location: [place];  Service: Orthopedics;  Laterality: Right;   POLYPECTOMY  09/17/2019   Procedure: POLYPECTOMY;  Surgeon: [name], MD;  Location: [facility] ENDOSCOPY;  Service: Endoscopy;;       Family History  Problem Relation Age of Onset   Colon cancer Father    CAD Other    Colon cancer Paternal Uncle    Colon cancer Paternal Uncle    Diabetes Neg Hx     Social History   Tobacco Use   Smoking status: Former    Types: Cigarettes    Quit date: 1990    Years since quitting: 32.6   Smokeless tobacco: Never  Vaping Use   Vaping Use: Never used  Substance Use Topics   Alcohol use: Not Currently    Comment: 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 Authorizing Provider  carvedilol (COREG) 25 MG tablet Take 25 mg by mouth 2 (two) times daily with a meal.   Yes [provider]  docusate sodium (COLACE) 100 MG capsule Take 1 capsule (100 mg total) by mouth 2 (two) times daily. 02/06/21  Yes Delray Alt, PA-C  enoxaparin (LOVENOX) 30 MG/0.3ML injection Inject 30 mg into the skin daily. This is taken in addition to the '40mg'$  dose, for a total of '70mg'$  per day   Yes [provider]  enoxaparin (LOVENOX) 40 MG/0.4ML injection Inject 40 mg into the skin daily. This is taken in addition to the '30mg'$  dose, for a total of '70mg'$  per day   Yes [provider]  finasteride (PROSCAR) 5 MG tablet Take 1 tablet (5 mg total) by mouth daily. 06/05/20  Yes Dagar, Meredith Staggers, MD  furosemide (LASIX) 20 MG tablet Take 20 mg by mouth 2 (two) times daily. 12/26/20  Yes [provider]  gabapentin (NEURONTIN) 300 MG capsule Take 600 mg by mouth 3 (three) times daily.   Yes [provider]  HYDROcodone-acetaminophen (NORCO/VICODIN) 5-325 MG tablet Take 1-2 tablets by mouth every 4 (four) hours as needed for moderate pain (pain score 4-6). 02/06/21  Yes Delray Alt, PA-C  hydrOXYzine (ATARAX/VISTARIL) 25 MG tablet Take 1 tablet (25 mg total) by mouth daily. 06/04/20  Yes Dagar, Meredith Staggers, MD  metFORMIN (GLUCOPHAGE-XR) 500 MG 24 hr tablet Take 500 mg by mouth daily with breakfast.  02/18/18  Yes [provider]  pantoprazole (PROTONIX) 40 MG tablet Take 1 tablet (40 mg total) by mouth daily. 11/08/19 03/15/21 Yes E[place], Amy S, PA-C  polyethylene glycol (MIRALAX / GLYCOLAX) 17 g packet Take 17 g by mouth daily. 02/09/21  Yes [place]ford, Suann Larry, MD  rosuvastatin (CRESTOR) 5 MG tablet Take 5 mg by mouth daily.   Yes [provider]  spironolactone (ALDACTONE) 50 MG tablet Take 50 mg by mouth daily.   Yes [provider]  tamsulosin (FLOMAX) 0.4 MG CAPS  capsule Take 0.4 mg by mouth at bedtime. 02/18/18  Yes [provider]  acetaminophen (TYLENOL) 500 MG tablet Take 1,000-1,500 mg by mouth every 6 (six) hours as needed for moderate pain.    [provider]  enoxaparin (LOVENOX) 80 MG/0.8ML injection Inject 0.7 mLs (70 mg total) into the skin daily. 02/06/21 03/08/21  Delray Alt, PA-C  nystatin (MYCOSTATIN/NYSTOP) powder Apply 1 application topically daily as needed (irritation). 02/08/21 02/08/22  [place]ford, Suann Larry, MD  ondansetron (ZOFRAN) 4 MG tablet Take 1 tablet (4 mg total) by mouth every 6 (six) hours as needed for nausea. 02/06/21   Delray Alt, PA-C  pravastatin (PRAVACHOL) 10 MG tablet Take 10 mg by mouth at bedtime. 02/18/18   [provider]  triamcinolone cream (KENALOG) 0.1 % Apply 1 application topically 2 (two) times daily. Patient not taking: Reported on 03/15/2021 06/08/20   [name], MD    Allergies    Patient has no known allergies.  Review of Systems   Review of Systems  Gastrointestinal:  Positive for nausea and vomiting.  Skin:  Positive for wound.  Neurological:  Positive for weakness.  All other systems reviewed and are negative.  Physical Exam Updated Vital Signs BP 118/82   Pulse 78   Temp 99.6 F (37.6 C) (Rectal)   Resp 12   Ht '5\' 6"'$  (1.676 m)   Wt (!) 152.2 kg   SpO2 99%   BMI 54.16 kg/m   Physical Exam Vitals and nursing note reviewed.  Constitutional:      General: He is not in acute distress.    Appearance: Normal appearance. He is obese.     Comments: Appears chronically ill  HENT:     Head: Normocephalic and atraumatic.  Eyes:     Conjunctiva/sclera: Conjunctivae normal.     Pupils: Pupils are equal, round, and reactive to light.  Cardiovascular:     Rate and Rhythm: Normal rate and regular rhythm.     Pulses: Normal pulses.  Pulmonary:     Effort: Pulmonary effort is normal. No respiratory distress.     Breath sounds: Normal breath sounds. No  wheezing.     Comments: Speaking in full sentences.  Clear lung sounds in all fields. Abdominal:     General: There is no distension.  Palpations: Abdomen is soft. There is no mass.     Tenderness: There is no abdominal tenderness. There is no guarding or rebound.  Musculoskeletal:        General: Normal range of motion.     Cervical back: Normal range of motion and neck supple.     Comments: Sacral wound, stage 2  Skin:    General: Skin is warm and dry.     Capillary Refill: Capillary refill takes less than 2 seconds.  Neurological:     Mental Status: He is alert and oriented to person, place, and time.  Psychiatric:        Mood and Affect: Mood and affect normal.        Speech: Speech normal.        Behavior: Behavior normal.      ED Results / Procedures / Treatments   Labs (all labs ordered are listed, but only abnormal results are displayed) Labs Reviewed  LACTIC ACID, PLASMA - Abnormal; Notable for the following components:      Result Value   Lactic Acid, Venous 2.2 (*)    All other components within normal limits  COMPREHENSIVE METABOLIC PANEL - Abnormal; Notable for the following components:   Chloride 97 (*)    Glucose, Bld 131 (*)    BUN 44 (*)    Creatinine, Ser 1.53 (*)    Albumin 2.7 (*)    AST 12 (*)    GFR, Estimated 47 (*)    All other components within normal limits  CBC WITH DIFFERENTIAL/PLATELET - Abnormal; Notable for the following components:   MCHC 29.8 (*)    Neutro Abs 7.8 (*)    All other components within normal limits  URINALYSIS, ROUTINE W REFLEX MICROSCOPIC - Abnormal; Notable for the following components:   Color, Urine AMBER (*)    APPearance CLOUDY (*)    Hgb urine dipstick SMALL (*)    Protein, ur 100 (*)    Leukocytes,Ua MODERATE (*)    RBC / HPF >50 (*)    WBC, UA >50 (*)    Bacteria, UA MANY (*)    All other components within normal limits  AMMONIA - Abnormal; Notable for the following components:   Ammonia 97 (*)    All  other components within normal limits  I-STAT VENOUS BLOOD GAS, ED - Abnormal; Notable for the following components:   pO2, Ven 140.0 (*)    Bicarbonate 33.8 (*)    TCO2 35 (*)    Acid-Base Excess 7.0 (*)    All other components within normal limits  I-STAT CHEM 8, ED - Abnormal; Notable for the following components:   BUN 56 (*)    Creatinine, Ser 1.40 (*)    Glucose, Bld 130 (*)    All other components within normal limits  CBG MONITORING, ED - Abnormal; Notable for the following components:   Glucose-Capillary 130 (*)    All other components within normal limits  URINE CULTURE  CULTURE, BLOOD (ROUTINE X 2)  CULTURE, BLOOD (ROUTINE X 2)  RESP PANEL BY RT-PCR (FLU A&B, COVID) ARPGX2  PROTIME-INR  APTT  LACTIC ACID, PLASMA    EKG None  Radiology DG Chest 1 View  Result Date: 03/15/2021 CLINICAL DATA:  Sacral osteomyelitis, weakness EXAM: CHEST  1 VIEW COMPARISON:  02/08/2021 FINDINGS: Single frontal view of the chest demonstrates an unremarkable cardiac silhouette. Postsurgical changes from median sternotomy. Stable atherosclerosis of the aorta. No airspace disease, effusion, or pneumothorax. No acute bony  abnormality. IMPRESSION: 1. Stable chest, no acute process. Electronically Signed   By: Randa Ngo M.D.   On: 03/15/2021 17:02   DG Sacrum/Coccyx  Result Date: 03/15/2021 CLINICAL DATA:  Wound EXAM: SACRUM AND COCCYX - 2+ VIEW COMPARISON:  02/03/2021 FINDINGS: Pubic symphysis and rami are intact. No SI joint widening. No fracture or malalignment. No definitive osseous destructive change. IMPRESSION: No definite acute osseous abnormality. Cross-sectional imaging follow-up if continued suspicion for infection Electronically Signed   By: Donavan Foil M.D.   On: 03/15/2021 16:47    Procedures Procedures   Medications Ordered in ED Medications  cefTRIAXone (ROCEPHIN) 1 g in sodium chloride 0.9 % 100 mL IVPB (1 g Intravenous New Bag/Given 03/15/21 1712)  sodium chloride 0.9  % bolus 500 mL (500 mLs Intravenous New Bag/Given 03/15/21 1713)    ED Course  I have reviewed the triage vital signs and the nursing notes.  Pertinent labs & imaging results that were available during my care of the patient were reviewed by me and considered in my medical decision making (see chart for details).    MDM Rules/Calculators/A&P                           Patient presenting for evaluation of generalized weakness, nausea, vomiting, sacral wound.  On exam, patient appears chronically ill.  Vital signs are stable.  Patient does not meet SIRS criteria, will not call a code sepsis, however will obtain sepsis labs including blood cultures and lactic.  Will obtain urine and chest x-ray to look for signs of sources of infection.  Sacral x-ray to ensure no osteo-, although I have low suspicion as this appears very superficial at this time.  Labs interpreted by me, shows elevated creatinine from baseline at 1.5, but not quite double baseline.  Lactic is minimally elevated at 2.2, likely due to infection, however there is no leukocytosis.  Vital signs remained stable.  Urine consistent with infection.  Chest x-ray viewed and independently interpreted by me, no pneumonia pneumothorax and effusion.  As patient appears deconditioned in the setting of infection and minimally elevated lactic, will rehydrate, treat with IV antibiotics, and plan for admission.  Discussed with Dr. Dwyane Dee from [facility] hospitalist service, patient to be admitted  Final Clinical Impression(s) / ED Diagnoses Final diagnoses:  Urinary tract infection without hematuria, site unspecified  Wound of sacral region, initial encounter  Dehydration    Rx / DC Orders ED Discharge Orders     None        Franchot Heidelberg, PA-C 03/15/21 1719    Luna Fuse, MD 03/19/21 1511

## 2021-03-15 NOTE — H&P (Addendum)
History and Physical    Brandon [name] [id number] DOB: Aug 28, 1945 DOA: 03/15/2021  PCP: Helane Rima, MD   Patient coming from: SNF [facility]  I have personally briefly reviewed patient's old medical records in [place]  Chief Complaint: Generalized weakness, Nausea, Vomiting and wound check  HPI: Brandon [name] is a 75 y.o. male with PMH significant for atrial fibrillation, cirrhosis of liver, colon cancer, coronary artery disease, diabetes, hypertension, history of GI bleed, obesity, BPH,  s/p right knee surgery 4 weeks ago on therapeutic Lovenox was sent from skilled nursing facility for generalized weakness, intermittent nausea, vomiting and for sacral wound check.  Patient reports generalized weakness, tiredness, intermittent nausea and vomiting for 1 week.  He was discharged to a skilled nursing facility for rehab after having right knee surgery.  Patient reported he has developed a wound on his sacrum for last 1 week which has been very painful, he reports changing his posture every 2 hours while being on the bed.  He is not prescribed any antibiotics in the nursing home.  He also reports has increased urinary frequency and burning for last few days.  He denies any fever, cough, chest pain, shortness of breath, dizziness, recent travel, sick contacts.  ED Course: He is hemodynamically stable, Vitals : HR 78, RR 12, BP 118/82, Temp 99.6 F, SPO2 99% Labs: Sodium 135, potassium 4.6 ,chloride 97, bicarb 27, glucose 130, BUN 44, creatinine 1.53, calcium 10.0 ,anion gap 11, albumin 2.7 ,AST 12 ,ALT 8, total protein 6.7, ammonia 97 ,total bilirubin 0.8 ,lactic acid 2.2, WBC 10.3 ,hemoglobin 13.4, hematocrit 45.0, platelet 231, influenza negative COVID-negative.  UA leukocyte : moderate, bacteria many.  Chest x-ray no acute infiltrate, x-ray sacrum no acute osseous abnormality.  Review of Systems:  Review of Systems  Constitutional:  Positive for malaise/fatigue.  HENT: Negative.     Eyes: Negative.   Respiratory: Negative.    Cardiovascular: Negative.   Gastrointestinal:  Positive for nausea and vomiting.  Genitourinary:  Positive for dysuria, frequency and urgency.  Musculoskeletal:  Positive for back pain.  Skin: Negative.   Neurological:  Positive for dizziness and weakness.  Psychiatric/Behavioral: Negative.     Past Medical History:  Diagnosis Date   Anemia 07/2019   Hb 6.7 >> 3 PRBCs >> 8.8 in 07/2019.      Atrial fibrillation ([place])    Cholelithiasis    Cirrhosis of liver ([place])    Presumed due to EtOH Brandon well Brandon fatty liver from morbid obesity.  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  Not performed in [place] N/A 09/17/2019   Procedure: COLONOSCOPY WITH PROPOFOL;  Surgeon: [name], MD;  Location: [place];  Service: Endoscopy;  Laterality: N/A;  CORONARY ARTERY BYPASS GRAFT     in his 54s   ESOPHAGOGASTRODUODENOSCOPY (EGD) WITH PROPOFOL N/A 08/14/2019   Procedure: ESOPHAGOGASTRODUODENOSCOPY (EGD) WITH PROPOFOL;  Surgeon: [name], MD;  Location: [place];  Service: Gastroenterology;  Laterality: N/A;   ESOPHAGOGASTRODUODENOSCOPY (EGD) WITH PROPOFOL N/A 09/17/2019   Procedure: ESOPHAGOGASTRODUODENOSCOPY (EGD) WITH PROPOFOL;  Surgeon: [name], MD;  Location: [place];  Service: Endoscopy;  Laterality: N/A;   ESOPHAGOGASTRODUODENOSCOPY (EGD) WITH PROPOFOL N/A 10/04/2019   Procedure: ESOPHAGOGASTRODUODENOSCOPY (EGD) WITH PROPOFOL;  Surgeon: Mauri Pole, MD;  Location: [facility] ENDOSCOPY;  Service: Endoscopy;  Laterality: N/A;   GIVENS CAPSULE STUDY N/A 09/17/2019   Procedure: GIVENS CAPSULE STUDY;  Surgeon: [name], MD;  Location: [place];  Service: Endoscopy;  Laterality: N/A;   HOT HEMOSTASIS N/A 08/14/2019   Procedure: HOT HEMOSTASIS (ARGON PLASMA COAGULATION/BICAP);  Surgeon: [name], MD;  Location: [facility] ENDOSCOPY;  Service: Gastroenterology;  Laterality: N/A;   HOT HEMOSTASIS N/A 09/17/2019   Procedure: HOT HEMOSTASIS (ARGON PLASMA COAGULATION/BICAP);  Surgeon: [name], MD;  Location: [place];  Service: Endoscopy;  Laterality: N/A;   ORIF FEMUR FRACTURE Right 02/04/2021   Procedure: OPEN REDUCTION INTERNAL FIXATION (ORIF) DISTAL FEMUR FRACTURE;  Surgeon: Shona Needles, MD;  Location: [place];  Service: Orthopedics;  Laterality: Right;   PARTIAL COLECTOMY  2003   To address colon cancer   PATELLAR TENDON REPAIR Right 09/30/2018   Procedure: PATELLA TENDON REPAIR;  Surgeon: Altamese [place], MD;  Location: [place];  Service: Orthopedics;  Laterality: Right;   POLYPECTOMY  09/17/2019   Procedure: POLYPECTOMY;  Surgeon: [name], MD;  Location: [place];  Service: Endoscopy;;     reports that he quit smoking about 32 years ago. His smoking use included cigarettes. He has never used smokeless tobacco. He reports that he does not currently use alcohol. He reports that he does not use drugs.  No Known Allergies  Family History  Problem Relation Age of Onset   Colon cancer Father    CAD Other    Colon cancer Paternal Uncle    Colon cancer Paternal Uncle    Diabetes Neg Hx      Family history reviewed and not pertinent.  Prior to Admission medications   Medication Sig Start Date End Date  Taking? Authorizing Provider  carvedilol (COREG) 25 MG tablet Take 25 mg by mouth 2 (two) times daily with a meal.   Yes [provider]  docusate sodium (COLACE) 100 MG capsule Take 1 capsule (100 mg total) by mouth 2 (two) times daily. 02/06/21  Yes Delray Alt, PA-C  enoxaparin (LOVENOX) 30 MG/0.3ML injection Inject 30 mg into the skin daily. This is taken in addition to the '40mg'$  dose, for a total of '70mg'$  per day   Yes [provider]  enoxaparin (LOVENOX) 40 MG/0.4ML injection Inject 40 mg into the skin daily. This is taken in addition to the '30mg'$  dose, for a total of '70mg'$  per day   Yes [provider]  finasteride (PROSCAR) 5 MG tablet Take 1 tablet (5 mg total) by mouth daily. 06/05/20  Yes Dagar, Meredith Staggers, MD  furosemide (LASIX) 20 MG tablet Take 20 mg by mouth 2 (two) times daily. 12/26/20  Yes [provider]  gabapentin (NEURONTIN) 300 MG capsule Take 600 mg by mouth 3 (three) times daily.   Yes [provider]  HYDROcodone-acetaminophen (NORCO/VICODIN) 5-325 MG tablet Take 1-2 tablets by mouth every 4 (four) hours Brandon needed for moderate  pain (pain score 4-6). 02/06/21  Yes Delray Alt, PA-C  hydrOXYzine (ATARAX/VISTARIL) 25 MG tablet Take 1 tablet (25 mg total) by mouth daily. 06/04/20  Yes Dagar, Meredith Staggers, MD  metFORMIN (GLUCOPHAGE-XR) 500 MG 24 hr tablet Take 500 mg by mouth daily with breakfast.  02/18/18  Yes [provider]  pantoprazole (PROTONIX) 40 MG tablet Take 1 tablet (40 mg total) by mouth daily. 11/08/19 03/15/21 Yes Esterwood, Amy S, PA-C  polyethylene glycol (MIRALAX / GLYCOLAX) 17 g packet Take 17 g by mouth daily. 02/09/21  Yes Danford, Suann Larry, MD  rosuvastatin (CRESTOR) 5 MG tablet Take 5 mg by mouth daily.   Yes [provider]  spironolactone (ALDACTONE) 50 MG tablet Take 50 mg by mouth daily.   Yes [provider]  tamsulosin (FLOMAX) 0.4 MG CAPS capsule Take 0.4 mg by mouth at bedtime. 02/18/18   Yes [provider]  acetaminophen (TYLENOL) 500 MG tablet Take 1,000-1,500 mg by mouth every 6 (six) hours Brandon needed for moderate pain.    [provider]  enoxaparin (LOVENOX) 80 MG/0.8ML injection Inject 0.7 mLs (70 mg total) into the skin daily. 02/06/21 03/08/21  Delray Alt, PA-C  nystatin (MYCOSTATIN/NYSTOP) powder Apply 1 application topically daily Brandon needed (irritation). 02/08/21 02/08/22  Danford, Suann Larry, MD  ondansetron (ZOFRAN) 4 MG tablet Take 1 tablet (4 mg total) by mouth every 6 (six) hours Brandon needed for nausea. 02/06/21   Delray Alt, PA-C  pravastatin (PRAVACHOL) 10 MG tablet Take 10 mg by mouth at bedtime. 02/18/18   [provider]  triamcinolone cream (KENALOG) 0.1 % Apply 1 application topically 2 (two) times daily. Patient not taking: Reported on 03/15/2021 06/08/20   [name], MD    Physical Exam: Vitals:   03/15/21 1550 03/15/21 1700 03/15/21 1710 03/15/21 1715  BP: (!) 121/104 (!) 80/65 116/76 118/82  Pulse: 88 89 78 78  Resp: '16 15 16 12  '$ Temp:      TempSrc:      SpO2: 99% 97% 99% 99%  Weight:      Height:        Constitutional: Appears comfortable, not in any acute distress. Vitals:   03/15/21 1550 03/15/21 1700 03/15/21 1710 03/15/21 1715  BP: (!) 121/104 (!) 80/65 116/76 118/82  Pulse: 88 89 78 78  Resp: '16 15 16 12  '$ Temp:      TempSrc:      SpO2: 99% 97% 99% 99%  Weight:      Height:       Eyes: PERRL, lids and conjunctivae normal. ENMT: Mucous membranes are moist. Posterior pharynx clear of any exudate or lesions.Normal dentition.  Neck: normal, supple, no masses, no thyromegaly Respiratory: Clear to auscultation bilaterally, no wheezes.  No accessory muscle use.  Cardiovascular: Irregular rhythm, no murmur. No extremity edema. 2+ pedal pulses. No carotid bruits.  Abdomen: no tenderness, no masses palpated. No hepatosplenomegaly. Bowel sounds positive.  Musculoskeletal: Right knee tenderness noted,  surgical scar from previous surgery+. Good ROM, no contractures. Normal muscle tone.  Skin: Stage II decubitus sacral ulcer noted. Neurologic: CN 2-12 grossly intact. Sensation intact, DTR normal. Strength 5/5 in all 4.  Psychiatric: Normal judgment and insight. Alert and oriented x 3. Normal mood.    Labs on Admission: I have personally reviewed following labs and imaging studies  CBC: Recent Labs  Lab 03/15/21 1521 03/15/21 1620  WBC 10.3  --   NEUTROABS 7.8*  --   HGB 13.4 15.0  15.0  HCT 45.0 44.0  44.0  MCV 94.3  --   PLT 231  --    Basic Metabolic Panel: Recent Labs  Lab 03/15/21 1521 03/15/21 1620  NA 135 136  135  K 4.6 4.8  4.8  CL 97* 99  CO2 27  --   GLUCOSE 131* 130*  BUN 44* 56*  CREATININE 1.53* 1.40*  CALCIUM 10.0  --    GFR: Estimated Creatinine Clearance: 65 mL/min (A) (by C-G formula based on SCr of 1.4 mg/dL (H)). Liver Function Tests: Recent Labs  Lab 03/15/21 1521  AST 12*  ALT 8  ALKPHOS 104  BILITOT 0.8  PROT 6.5  ALBUMIN 2.7*   No results for input(s): LIPASE, AMYLASE in the last 168 hours. Recent Labs  Lab 03/15/21 1523  AMMONIA 97*   Coagulation Profile: Recent Labs  Lab 03/15/21 1521  INR 1.2   Cardiac Enzymes: No results for input(s): CKTOTAL, CKMB, CKMBINDEX, TROPONINI in the last 168 hours. BNP (last 3 results) No results for input(s): PROBNP in the last 8760 hours. HbA1C: No results for input(s): HGBA1C in the last 72 hours. CBG: Recent Labs  Lab 03/15/21 1548  GLUCAP 130*   Lipid Profile: No results for input(s): CHOL, HDL, LDLCALC, TRIG, CHOLHDL, LDLDIRECT in the last 72 hours. Thyroid Function Tests: No results for input(s): TSH, T4TOTAL, FREET4, T3FREE, THYROIDAB in the last 72 hours. Anemia Panel: No results for input(s): VITAMINB12, FOLATE, FERRITIN, TIBC, IRON, RETICCTPCT in the last 72 hours. Urine analysis:    Component Value Date/Time   COLORURINE AMBER (A) 03/15/2021 1521   APPEARANCEUR  CLOUDY (A) 03/15/2021 1521   LABSPEC 1.012 03/15/2021 1521   PHURINE 8.0 03/15/2021 1521   GLUCOSEU NEGATIVE 03/15/2021 1521   HGBUR SMALL (A) 03/15/2021 1521   BILIRUBINUR NEGATIVE 03/15/2021 1521   KETONESUR NEGATIVE 03/15/2021 1521   PROTEINUR 100 (A) 03/15/2021 1521   NITRITE NEGATIVE 03/15/2021 1521   LEUKOCYTESUR MODERATE (A) 03/15/2021 1521    Radiological Exams on Admission: DG Chest 1 View  Result Date: 03/15/2021 CLINICAL DATA:  Sacral osteomyelitis, weakness EXAM: CHEST  1 VIEW COMPARISON:  02/08/2021 FINDINGS: Single frontal view of the chest demonstrates an unremarkable cardiac silhouette. Postsurgical changes from median sternotomy. Stable atherosclerosis of the aorta. No airspace disease, effusion, or pneumothorax. No acute bony abnormality. IMPRESSION: 1. Stable chest, no acute process. Electronically Signed   By: Randa Ngo M.D.   On: 03/15/2021 17:02   DG Sacrum/Coccyx  Result Date: 03/15/2021 CLINICAL DATA:  Wound EXAM: SACRUM AND COCCYX - 2+ VIEW COMPARISON:  02/03/2021 FINDINGS: Pubic symphysis and rami are intact. No SI joint widening. No fracture or malalignment. No definitive osseous destructive change. IMPRESSION: No definite acute osseous abnormality. Cross-sectional imaging follow-up if continued suspicion for infection Electronically Signed   By: Donavan Foil M.D.   On: 03/15/2021 16:47    EKG: Independently reviewed.  Atrial fibrillation.  Assessment/Plan Principal Problem:   Generalized weakness Active Problems:   History of colon cancer   CAD (coronary artery disease)   DM (diabetes mellitus), type 2 (HCC)   Obesity BMI over 52   Symptomatic anemia   Atrial fibrillation (HCC)   Chronic pain syndrome   Alcoholic cirrhosis of liver with ascites (HCC)   Chronic heart failure with preserved ejection fraction (HFpEF) (HCC)   Generalized weakness,  fatigue could be secondary to UTI / Dehydration. Patient reports generalized weakness, dizziness and  increased urinary frequency. Patient also reported intermittent nausea and vomiting UA  consistent with UTI, continue ceftriaxone for now. Lactic acid 2.2 , No leucocytosis, vitals are stable.   No signs of sepsis at this point. Follow-up urine cultures. Continue gentle IV hydration. Zofran Brandon needed. PT and OT evaluation.  AKI : Serum creatinine 0.89 at baseline, creatinine at admission 1.5 Avoid nephrotoxic medication, continue to monitor, continue gentle hydration.  UTI: Continue ceftriaxone, follow-up urine cultures  Hyperammonemia:  Ammonia level 97, Patient alert and oriented X3 Recheck ammonia level and start lactulose.  A. Fibrillation: Heart rate well controlled, Patient is on Lovenox therapeutic dose.  Diabetes mellitus: Obtain hemoglobin A1c, hold metformin Regular insulin sliding scale, carb modified diet.  Sacral wound: Patient has developed a stage II sacral decubitus ulcer while being in nursing home. Obtain wound care consult.  Hypertension: Continue Coreg.  S/p right knee surgery: X-ray knee no obvious deformity. Continue Lovenox, PT and OT   DVT prophylaxis: Lovenox Code Status: DNR Family Communication: No family at bed side. Disposition Plan:   Status is: Observation  The patient remains OBS appropriate and will d/c before 2 midnights.  Dispo: The patient is from: SNF              Anticipated d/c is to: SNF              Patient currently is not medically stable to d/c.   Difficult to place patient No   Consults called: None Admission status: Observation   Shawna Clamp MD [facility]   If 7PM-7AM, please contact night-coverage   03/15/2021, 6:29 PM

## 2021-03-16 DIAGNOSIS — S31000A Unspecified open wound of lower back and pelvis without penetration into retroperitoneum, initial encounter: Secondary | ICD-10-CM | POA: Diagnosis not present

## 2021-03-16 DIAGNOSIS — Z66 Do not resuscitate: Secondary | ICD-10-CM | POA: Diagnosis present

## 2021-03-16 DIAGNOSIS — E119 Type 2 diabetes mellitus without complications: Secondary | ICD-10-CM | POA: Diagnosis present

## 2021-03-16 DIAGNOSIS — N390 Urinary tract infection, site not specified: Secondary | ICD-10-CM | POA: Diagnosis present

## 2021-03-16 DIAGNOSIS — E7220 Disorder of urea cycle metabolism, unspecified: Secondary | ICD-10-CM | POA: Diagnosis not present

## 2021-03-16 DIAGNOSIS — B9689 Other specified bacterial agents as the cause of diseases classified elsewhere: Secondary | ICD-10-CM | POA: Diagnosis present

## 2021-03-16 DIAGNOSIS — I480 Paroxysmal atrial fibrillation: Secondary | ICD-10-CM

## 2021-03-16 DIAGNOSIS — N401 Enlarged prostate with lower urinary tract symptoms: Secondary | ICD-10-CM | POA: Diagnosis present

## 2021-03-16 DIAGNOSIS — I110 Hypertensive heart disease with heart failure: Secondary | ICD-10-CM | POA: Diagnosis present

## 2021-03-16 DIAGNOSIS — W19XXXD Unspecified fall, subsequent encounter: Secondary | ICD-10-CM | POA: Diagnosis present

## 2021-03-16 DIAGNOSIS — Z800 Family history of malignant neoplasm of digestive organs: Secondary | ICD-10-CM | POA: Diagnosis not present

## 2021-03-16 DIAGNOSIS — D649 Anemia, unspecified: Secondary | ICD-10-CM | POA: Diagnosis present

## 2021-03-16 DIAGNOSIS — K7031 Alcoholic cirrhosis of liver with ascites: Secondary | ICD-10-CM | POA: Diagnosis present

## 2021-03-16 DIAGNOSIS — I5032 Chronic diastolic (congestive) heart failure: Secondary | ICD-10-CM | POA: Diagnosis present

## 2021-03-16 DIAGNOSIS — N179 Acute kidney failure, unspecified: Secondary | ICD-10-CM | POA: Diagnosis present

## 2021-03-16 DIAGNOSIS — Z20822 Contact with and (suspected) exposure to covid-19: Secondary | ICD-10-CM | POA: Diagnosis present

## 2021-03-16 DIAGNOSIS — I2510 Atherosclerotic heart disease of native coronary artery without angina pectoris: Secondary | ICD-10-CM | POA: Diagnosis present

## 2021-03-16 DIAGNOSIS — S72401D Unspecified fracture of lower end of right femur, subsequent encounter for closed fracture with routine healing: Secondary | ICD-10-CM | POA: Diagnosis not present

## 2021-03-16 DIAGNOSIS — R531 Weakness: Secondary | ICD-10-CM | POA: Diagnosis not present

## 2021-03-16 DIAGNOSIS — L8990 Pressure ulcer of unspecified site, unspecified stage: Secondary | ICD-10-CM | POA: Insufficient documentation

## 2021-03-16 DIAGNOSIS — K760 Fatty (change of) liver, not elsewhere classified: Secondary | ICD-10-CM | POA: Diagnosis present

## 2021-03-16 DIAGNOSIS — L89313 Pressure ulcer of right buttock, stage 3: Secondary | ICD-10-CM | POA: Diagnosis present

## 2021-03-16 DIAGNOSIS — L89152 Pressure ulcer of sacral region, stage 2: Secondary | ICD-10-CM | POA: Diagnosis present

## 2021-03-16 DIAGNOSIS — G894 Chronic pain syndrome: Secondary | ICD-10-CM | POA: Diagnosis present

## 2021-03-16 DIAGNOSIS — Z6841 Body Mass Index (BMI) 40.0 and over, adult: Secondary | ICD-10-CM | POA: Diagnosis not present

## 2021-03-16 DIAGNOSIS — L89322 Pressure ulcer of left buttock, stage 2: Secondary | ICD-10-CM | POA: Diagnosis present

## 2021-03-16 DIAGNOSIS — E860 Dehydration: Secondary | ICD-10-CM | POA: Diagnosis present

## 2021-03-16 DIAGNOSIS — Z8249 Family history of ischemic heart disease and other diseases of the circulatory system: Secondary | ICD-10-CM | POA: Diagnosis not present

## 2021-03-16 LAB — COMPREHENSIVE METABOLIC PANEL
ALT: 8 U/L (ref 0–44)
AST: 15 U/L (ref 15–41)
Albumin: 2.6 g/dL — ABNORMAL LOW (ref 3.5–5.0)
Alkaline Phosphatase: 103 U/L (ref 38–126)
Anion gap: 12 (ref 5–15)
BUN: 45 mg/dL — ABNORMAL HIGH (ref 8–23)
CO2: 25 mmol/L (ref 22–32)
Calcium: 9.9 mg/dL (ref 8.9–10.3)
Chloride: 99 mmol/L (ref 98–111)
Creatinine, Ser: 1.42 mg/dL — ABNORMAL HIGH (ref 0.61–1.24)
GFR, Estimated: 52 mL/min — ABNORMAL LOW (ref >60)
Glucose, Bld: 137 mg/dL — ABNORMAL HIGH (ref 70–99)
Potassium: 4.2 mmol/L (ref 3.5–5.1)
Sodium: 136 mmol/L (ref 135–145)
Total Bilirubin: 0.7 mg/dL (ref 0.3–1.2)
Total Protein: 6.4 g/dL — ABNORMAL LOW (ref 6.5–8.1)

## 2021-03-16 LAB — MAGNESIUM: Magnesium: 1.7 mg/dL (ref 1.7–2.4)

## 2021-03-16 LAB — CBC
HCT: 42.8 % (ref 39.0–52.0)
Hemoglobin: 13 g/dL (ref 13.0–17.0)
MCH: 28 pg (ref 26.0–34.0)
MCHC: 30.4 g/dL (ref 30.0–36.0)
MCV: 92 fL (ref 80.0–100.0)
Platelets: 231 10*3/uL (ref 150–400)
RBC: 4.65 MIL/uL (ref 4.22–5.81)
RDW: 14.3 % (ref 11.5–15.5)
WBC: 7.9 10*3/uL (ref 4.0–10.5)
nRBC: 0 % (ref 0.0–0.2)

## 2021-03-16 LAB — GLUCOSE, CAPILLARY
Glucose-Capillary: 136 mg/dL — ABNORMAL HIGH (ref 70–99)
Glucose-Capillary: 204 mg/dL — ABNORMAL HIGH (ref 70–99)
Glucose-Capillary: 231 mg/dL — ABNORMAL HIGH (ref 70–99)
Glucose-Capillary: 145 mg/dL — ABNORMAL HIGH (ref 70–99)

## 2021-03-16 LAB — AMMONIA: Ammonia: 101 umol/L — ABNORMAL HIGH (ref 9–35)

## 2021-03-16 LAB — PHOSPHORUS: Phosphorus: 3 mg/dL (ref 2.5–4.6)

## 2021-03-16 LAB — HEMOGLOBIN A1C
Hgb A1c MFr Bld: 6.3 % — ABNORMAL HIGH (ref 4.8–5.6)
Mean Plasma Glucose: 134.11 mg/dL

## 2021-03-16 MED ORDER — LACTULOSE 10 GM/15ML PO SOLN
20.0000 g | Freq: Two times a day (BID) | ORAL | Status: DC
  Administered 2021-03-16 – 2021-03-18 (×5): 20 g via ORAL
  Filled 2021-03-16 (×5): qty 30

## 2021-03-16 MED ORDER — ZINC OXIDE 40 % EX OINT
TOPICAL_OINTMENT | Freq: Three times a day (TID) | CUTANEOUS | Status: DC
  Administered 2021-03-16: 1 via TOPICAL
  Filled 2021-03-16: qty 57

## 2021-03-16 NOTE — Progress Notes (Signed)
[facility] PROGRESS NOTE   Brandon [name] [id number] DOB: 24-Jun-1946 DOA: 03/15/2021  PCP: Helane Rima, MD  Brief History/Interval Summary: 75 y.o. male with PMH significant for atrial fibrillation, cirrhosis of liver, colon cancer, coronary artery disease, diabetes, hypertension, history of GI bleed, obesity, BPH,  s/p right knee surgery 4 weeks ago on therapeutic Lovenox was sent from skilled nursing facility for generalized weakness, intermittent nausea, vomiting and for sacral wound check.  Patient was found to have urinary tract infection.  He was hospitalized for further management.    Consultants: None  Procedures: None  Antibiotics: Anti-infectives (From admission, onward)    Start     Dose/Rate Route Frequency Ordered Stop   03/16/21 1700  cefTRIAXone (ROCEPHIN) 1 g in sodium chloride 0.9 % 100 mL IVPB        1 g 200 mL/hr over 30 Minutes Intravenous Every 24 hours 03/15/21 1724     03/15/21 1730  cefTRIAXone (ROCEPHIN) 1 g in sodium chloride 0.9 % 100 mL IVPB  Status:  Discontinued        1 g 200 mL/hr over 30 Minutes Intravenous Every 24 hours 03/15/21 1724 03/15/21 1724   03/15/21 1700  cefTRIAXone (ROCEPHIN) 1 g in sodium chloride 0.9 % 100 mL IVPB        1 g 200 mL/hr over 30 Minutes Intravenous  Once 03/15/21 1651 03/15/21 1847        Subjective/Interval History: Patient seems to be slightly distracted this morning.  Denies any abdominal pain nausea or vomiting.  He does complain of pain with urinating.  Denies any diarrhea.  No pain in the rectum.    Assessment/Plan:  Urinary tract infection with generalized weakness and fatigue Patient did report dysuria.  His UA was noted to be abnormal.  He did have a Foley catheter during previous hospitalization.  Patient with history of BPH with chronic bladder outlet obstruction and has had difficulty voiding urine in the past Follow-up on cultures.  Continue ceftriaxone for now.  Lactic acid level has  improved.  TSH normal at 1.02.  PT and OT evaluation will be needed.  Acute kidney injury Baseline creatinine 0.89.  Presented with creatinine of 1.5.  Avoid nephrotoxic agents.  Continue gentle IV hydration.  Monitor urine output.  If there is no improvement consider imaging studies.  History of liver cirrhosis with hyperammonemia Patient does exhibit some asterixis.  Ammonia level noted to be significantly elevated.  Started on lactulose.  Recheck levels tomorrow.  No clear evidence for hepatic encephalopathy at this time.  Atrial fibrillation Was not on long-term anticoagulation due to history of liver cirrhosis and prior GI bleed.  He was discharged on DVT prophylaxis after his distal right femoral fracture in July.  Diabetes mellitus type 2, controlled HbA1c 6.3.  Monitor CBGs.  SSI.  Essential hypertension Continue carvedilol.  Status post surgery for recent right distal femoral fracture  Seems to be stable.  Wound is healing well.  Good mobility in the right knee is noted.  Stage II sacral decubitus, present on admission Developed this while he was in the nursing home.  Wound care nurse. Pressure Injury 03/15/21 Buttocks Right Stage 2 -  Partial thickness loss of dermis presenting as a shallow open injury with a red, pink wound bed without slough. Sloughing noted pink and tan areas on buttock (Active)  03/15/21 2029  Location: Buttocks  Location Orientation: Right  Staging: Stage 2 -  Partial thickness loss of dermis presenting as a  shallow open injury with a red, pink wound bed without slough.  Wound Description (Comments): Sloughing noted pink and tan areas on buttock  Present on Admission: Yes    Class III obesity Estimated body mass index is 48.07 kg/m as calculated from the following:   Height as of this encounter: '5\' 6"'$  (1.676 m).   Weight as of this encounter: 135.1 kg.   DVT Prophylaxis: On Lovenox Code Status: DNR Family Communication: No family at  bedside Disposition Plan: Hopefully return to SNF when improved  Status is: Observation  The patient will require care spanning > 2 midnights and should be moved to inpatient because: Altered mental status, IV treatments appropriate due to intensity of illness or inability to take PO, and Inpatient level of care appropriate due to severity of illness  Dispo: The patient is from: SNF              Anticipated d/c is to: SNF              Patient currently is not medically stable to d/c.   Difficult to place patient No       Medications: Scheduled:  carvedilol  25 mg Oral BID WC   docusate sodium  100 mg Oral BID   enoxaparin  70 mg Subcutaneous Q24H   finasteride  5 mg Oral Daily   insulin aspart  0-6 Units Subcutaneous TID WC   lactulose  20 g Oral BID   pantoprazole  40 mg Oral Daily   pravastatin  10 mg Oral QHS   senna  1 tablet Oral BID   tamsulosin  0.4 mg Oral QHS   Continuous:  sodium chloride 75 mL/hr at 03/16/21 0817   cefTRIAXone (ROCEPHIN)  IV     [id number] **OR** acetaminophen, HYDROcodone-acetaminophen, ondansetron **OR** ondansetron (ZOFRAN) IV   Objective:  Vital Signs  Vitals:   03/15/21 2138 03/16/21 0015 03/16/21 0456 03/16/21 0800  BP: 105/79 136/75 119/72 110/66  Pulse: 68 75 78   Resp: '14 20 14   '$ Temp: 98 F (36.7 C) 99.2 F (37.3 C) 98.5 F (36.9 C)   TempSrc: Oral Oral Oral   SpO2: 98%  98%   Weight:      Height:        Intake/Output Summary (Last 24 hours) at 03/16/2021 1037 Last data filed at 03/16/2021 0526 Gross per 24 hour  Intake 1756.69 ml  Output 400 ml  Net 1356.69 ml   Filed Weights   03/15/21 1506 03/15/21 2029  Weight: (!) 152.2 kg 135.1 kg    General appearance: Awake alert.  In no distress.  Slightly distracted Resp: Clear to auscultation bilaterally.  Normal effort Cardio: S1-S2 is normal regular.  No S3-S4.  No rubs murmurs or bruit GI: Abdomen is soft.  Nontender nondistended.  Bowel sounds are present  normal.  No masses organomegaly Extremities: No edema.  Good range of motion of the right knee. Neurologic: No focal neurological deficits.  Asterixis is present.   Lab Results:  Data Reviewed: I have personally reviewed following labs and imaging studies  CBC: Recent Labs  Lab 03/15/21 1521 03/15/21 1620 03/16/21 0300  WBC 10.3  --  7.9  NEUTROABS 7.8*  --   --   HGB 13.4 15.0  15.0 13.0  HCT 45.0 44.0  44.0 42.8  MCV 94.3  --  92.0  PLT 231  --  [id number]    Basic Metabolic Panel: Recent Labs  Lab 03/15/21 1521 03/15/21 1620 03/16/21 0300  NA 135 136  135 136  K 4.6 4.8  4.8 4.2  CL 97* 99 99  CO2 27  --  25  GLUCOSE 131* 130* 137*  BUN 44* 56* 45*  CREATININE 1.53* 1.40* 1.42*  CALCIUM 10.0  --  9.9  MG  --   --  1.7  PHOS  --   --  3.0    GFR: Estimated Creatinine Clearance: 59.6 mL/min (A) (by C-G formula based on SCr of 1.42 mg/dL (H)).  Liver Function Tests: Recent Labs  Lab 03/15/21 1521 03/16/21 0300  AST 12* 15  ALT 8 8  ALKPHOS 104 103  BILITOT 0.8 0.7  PROT 6.5 6.4*  ALBUMIN 2.7* 2.6*     Recent Labs  Lab 03/15/21 1523 03/16/21 0300  AMMONIA 97* 101*    Coagulation Profile: Recent Labs  Lab 03/15/21 1521  INR 1.2     HbA1C: Recent Labs    03/16/21 0300  HGBA1C 6.3*    CBG: Recent Labs  Lab 03/15/21 1548 03/15/21 2210 03/16/21 0809  GLUCAP 130* 129* 136*     Thyroid Function Tests: Recent Labs    03/15/21 1722  TSH 1.028     Recent Results (from the past 240 hour(s))  Blood Culture (routine x 2)     Status: None (Preliminary result)   Collection Time: 03/15/21  3:35 PM   Specimen: BLOOD RIGHT HAND  Result Value Ref Range Status   Specimen Description BLOOD RIGHT HAND  Final   Special Requests   Final    BOTTLES DRAWN AEROBIC AND ANAEROBIC Blood Culture results may not be optimal due to an inadequate volume of blood received in culture bottles   Culture   Final    NO GROWTH < 24 HOURS Performed at  [facility], [place] [address], [place], [place] [address]    Report Status PENDING  Incomplete  Blood Culture (routine x 2)     Status: None (Preliminary result)   Collection Time: 03/15/21  3:35 PM   Specimen: BLOOD LEFT HAND  Result Value Ref Range Status   Specimen Description BLOOD LEFT HAND  Final   Special Requests   Final    BOTTLES DRAWN AEROBIC AND ANAEROBIC Blood Culture results may not be optimal due to an inadequate volume of blood received in culture bottles   Culture   Final    NO GROWTH < 24 HOURS Performed at [facility], [place] [address] [place], [place] [address]    Report Status PENDING  Incomplete  Resp Panel by RT-PCR (Flu A&B, Covid) Nasopharyngeal Swab     Status: None   Collection Time: 03/15/21  7:30 PM   Specimen: Nasopharyngeal Swab; Nasopharyngeal(NP) swabs in vial transport medium  Result Value Ref Range Status   SARS Coronavirus 2 by RT PCR NEGATIVE NEGATIVE Final    Comment: (NOTE) SARS-CoV-2 target nucleic acids are NOT DETECTED.  The SARS-CoV-2 RNA is generally detectable in upper respiratory specimens during the acute phase of infection. The lowest concentration of SARS-CoV-2 viral copies this assay can detect is 138 copies/mL. A negative result does not preclude SARS-Cov-2 infection and should not be used as the sole basis for treatment or other patient management decisions. A negative result may occur with  improper specimen collection/handling, submission of specimen other than nasopharyngeal swab, presence of viral mutation(s) within the areas targeted by this assay, and inadequate number of viral copies(<138 copies/mL). A negative result must be combined with clinical observations, patient  history, and epidemiological information. The expected result is Negative.  Fact Sheet for Patients:  EntrepreneurPulse.com.au  Fact Sheet for Healthcare Providers:  IncredibleEmployment.be  This test is  no t yet approved or cleared by the [place] FDA and  has been authorized for detection and/or diagnosis of SARS-CoV-2 by FDA under an Emergency Use Authorization (EUA). This EUA will remain  in effect (meaning this test can be used) for the duration of the COVID-19 declaration under Section 564(b)(1) of the Act, 21 U.S.C.section 360bbb-3(b)(1), unless the authorization is terminated  or revoked sooner.       Influenza A by PCR NEGATIVE NEGATIVE Final   Influenza B by PCR NEGATIVE NEGATIVE Final    Comment: (NOTE) The Xpert Xpress SARS-CoV-2/FLU/RSV plus assay is intended as an aid in the diagnosis of influenza from Nasopharyngeal swab specimens and should not be used as a sole basis for treatment. Nasal washings and aspirates are unacceptable for Xpert Xpress SARS-CoV-2/FLU/RSV testing.  Fact Sheet for Patients: EntrepreneurPulse.com.au  Fact Sheet for Healthcare Providers: IncredibleEmployment.be  This test is not yet approved or cleared by the [place] FDA and has been authorized for detection and/or diagnosis of SARS-CoV-2 by FDA under an Emergency Use Authorization (EUA). This EUA will remain in effect (meaning this test can be used) for the duration of the COVID-19 declaration under Section 564(b)(1) of the Act, 21 U.S.C. section 360bbb-3(b)(1), unless the authorization is terminated or revoked.  Performed at [facility], [place] [address], [place], [place] [address]       Radiology Studies: DG Chest 1 View  Result Date: 03/15/2021 CLINICAL DATA:  Sacral osteomyelitis, weakness EXAM: CHEST  1 VIEW COMPARISON:  02/08/2021 FINDINGS: Single frontal view of the chest demonstrates an unremarkable cardiac silhouette. Postsurgical changes from median sternotomy. Stable atherosclerosis of the aorta. No airspace disease, effusion, or pneumothorax. No acute bony abnormality. IMPRESSION: 1. Stable chest, no acute process.  Electronically Signed   By: Randa Ngo M.D.   On: 03/15/2021 17:02   DG Sacrum/Coccyx  Result Date: 03/15/2021 CLINICAL DATA:  Wound EXAM: SACRUM AND COCCYX - 2+ VIEW COMPARISON:  02/03/2021 FINDINGS: Pubic symphysis and rami are intact. No SI joint widening. No fracture or malalignment. No definitive osseous destructive change. IMPRESSION: No definite acute osseous abnormality. Cross-sectional imaging follow-up if continued suspicion for infection Electronically Signed   By: Donavan Foil M.D.   On: 03/15/2021 16:47   DG Knee 1-2 Views Right  Result Date: 03/15/2021 CLINICAL DATA:  Knee pain EXAM: RIGHT KNEE - 1-2 VIEW COMPARISON:  02/04/2021 FINDINGS: Partially visualized surgical plate and fixating screws across comminuted extra-articular distal femoral fracture with residual lucency and no abundant callus. Patellar fracture with less apparent lucency suggests some interval healing. Diffuse soft tissue swelling with effusion. Advanced arthritis of the knee. IMPRESSION: 1. Partially visualized surgical plate and fixating screws in the distal femur across comminuted distal femoral fracture without much interval bridging callus. 2. Redemonstrated patella fracture with less visible lucency suggestive of some healing 3. Arthritis of the knee Electronically Signed   By: Donavan Foil M.D.   On: 03/15/2021 19:40       LOS: 0 days   [facility] Pager on [email or web address]  03/16/2021, 10:37 AM

## 2021-03-16 NOTE — Evaluation (Signed)
Physical Therapy Evaluation Patient Details Name: Brandon [name] MRN: [id number] DOB: 03/21/1946 Today's Date: 03/16/2021   History of Present Illness  75 y/o male presented to ED on 8/19 for generalized weakness, intermittent nausea, vomiting, and sacral wound check. Found to have UTI. Recent R knee surgery 4 weeks ago (TDWB). PMH: Afib, cirrhosis of liver, colon cancer, CAD, diabetes, HTN, history of GI bleed, obesity, BPH  Clinical Impression  PTA, patient was at [facility] following recent R knee sx and currently TDWB. Patient with reports of nausea and episode of emesis and deferred OOB mobility. Patient able to roll with maxA and reposition higher in bed with use of bed rails above head and maxA+2. Patient presents with generalized weakness, impaired balance, decreased activity tolerance, obesity, and impaired functional mobility. Patient will benefit from skilled PT services during acute stay to address listed deficits. Recommend return to SNF to continue therapy to maximize functional mobility.     Follow Up Recommendations SNF (return to SNF)    Equipment Recommendations  None recommended by PT    Recommendations for Other Services       Precautions / Restrictions Precautions Precautions: Fall Precaution Comments: recent R knee surgery (TDWB) Restrictions Weight Bearing Restrictions: Yes RLE Weight Bearing: Touchdown weight bearing      Mobility  Bed Mobility Overal bed mobility: Needs Assistance Bed Mobility: Rolling Rolling: Max assist         General bed mobility comments: able to initiate rolling towards R however requires maxA to complete rolling onto R side. Able to assist repositioning higher in bed with use of bed rail above head but still required maxA+2. Patient able to pull self to long sit with HOB elevated and maxA+2    Transfers                 General transfer comment: deferred due to patient complaining of nausea and recent episode of  emesis  Ambulation/Gait                Stairs            Wheelchair Mobility    Modified Rankin (Stroke Patients Only)       Balance                                             Pertinent Vitals/Pain Pain Assessment: No/denies pain    Home Living Family/patient expects to be discharged to:: Skilled nursing facility                      Prior Function Level of Independence: Needs assistance   Gait / Transfers Assistance Needed: primarily bed bound at SNF due to inability to maintain TDWB in standing  ADL's / Homemaking Assistance Needed: requires totalA for ADLs per patient. Rolls to perform pericare        Hand Dominance        Extremity/Trunk Assessment   Upper Extremity Assessment Upper Extremity Assessment: Defer to OT evaluation    Lower Extremity Assessment Lower Extremity Assessment: Generalized weakness       Communication   Communication: No difficulties  Cognition Arousal/Alertness: Awake/alert Behavior During Therapy: WFL for tasks assessed/performed Overall Cognitive Status: Within Functional Limits for tasks assessed  General Comments      Exercises     Assessment/Plan    PT Assessment Patient needs continued PT services  PT Problem List Decreased range of motion;Decreased strength;Decreased activity tolerance;Decreased balance;Decreased mobility;Decreased safety awareness;Decreased knowledge of use of DME;Decreased knowledge of precautions;Obesity       PT Treatment Interventions DME instruction;Functional mobility training;Therapeutic activities;Therapeutic exercise;Balance training;Patient/family education;Wheelchair mobility training    PT Goals (Current goals can be found in the Care Plan section)  Acute Rehab PT Goals Patient Stated Goal: to get better PT Goal Formulation: With patient Time For Goal Achievement: 03/30/21 Potential to  Achieve Goals: Fair    Frequency Min 2X/week   Barriers to discharge        Co-evaluation PT/OT/SLP Co-Evaluation/Treatment: Yes Reason for Co-Treatment: For patient/therapist safety;Complexity of the patient's impairments (multi-system involvement);To address functional/ADL transfers PT goals addressed during session: Mobility/safety with mobility;Strengthening/ROM         AM-PAC PT "6 Clicks" Mobility  Outcome Measure Help needed turning from your back to your side while in a flat bed without using bedrails?: A Lot Help needed moving from lying on your back to sitting on the side of a flat bed without using bedrails?: Total Help needed moving to and from a bed to a chair (including a wheelchair)?: Total Help needed standing up from a chair using your arms (e.g., wheelchair or bedside chair)?: Total Help needed to walk in hospital room?: Total Help needed climbing 3-5 steps with a railing? : Total 6 Click Score: 7    End of Session   Activity Tolerance: Patient tolerated treatment well Patient left: in bed;with bed alarm set;with call bell/phone within reach Nurse Communication: Mobility status PT Visit Diagnosis: Unsteadiness on feet (R26.81);Other abnormalities of gait and mobility (R26.89);Muscle weakness (generalized) (M62.81);History of falling (Z91.81);Difficulty in walking, not elsewhere classified (R26.2)    Time: [id number] PT Time Calculation (min) (ACUTE ONLY): 16 min   Charges:   PT Evaluation $PT Eval Moderate Complexity: 1 Mod          Josephanthony Tindel A. Gilford Rile PT, DPT Acute Rehabilitation Services Pager [phone] Office [phone]   Linna Hoff 03/16/2021, 11:32 AM

## 2021-03-16 NOTE — Consult Note (Signed)
WOC Nurse Consult Note: Reason for Consult: Moisture associated skin damage secondary to incontinence and intertriginous moisture, pressure. I am assisted with the development of the integumentary POC by the patient's Bedside RN,  L. Cameron Ali. Wound type:Moisture, pressure Pressure Injury POA: Yes Measurement:Per Nursing Flow Sheet Right Buttock: Stage 3, measures 6.5cm x 6.5cm x 0.2cm with read and yellow wound bed, small amount serous exudate Left Buttock: Stage 2, measures 0.5cm x 1.2cm x 0.1cm with red wound bed and scant amount of serous exudate Gluteal cleft, scrotum, perineum, bilateral inguinal and subpannicular areas: erythema and maceration Wound bed:As described above Drainage (amount, consistency, odor) scant, serous Periwound: intact Dressing procedure/placement/frequency: Patient is provided a mattress replacement for bariatric patients with low air loss feature, for microclimate management and bilateral pressure redistribution heel boots pressure injury prevention. Topical wound care with xeroform gauze topped with dry gauze and covered with silicone foam dressings will be used for the bilateral buttock wounds. Skin care for the gluteal cleft and other areas of MASD secondary to incontinence will be with 40% zinc oxide (Desitin).  Our house antimicrobial wicking textile (InterDry) will be used to manage and mitigate the subpannicular moisture collection.  Turning side to side is in place. Recommendation is provided to minimize time in the side lying position.  [place] nursing team will not follow, but will remain available to this patient, the nursing and medical teams.  Please re-consult if needed. Thanks, Maudie Flakes, MSN, RN, [place], Arther Abbott  Pager# [phone]

## 2021-03-16 NOTE — Evaluation (Signed)
Occupational Therapy Evaluation Patient Details Name: Brandon [name] MRN: [id number] DOB: 03/31/1946 Today's Date: 03/16/2021    History of Present Illness 75 y/o male presented to ED on 8/19 for generalized weakness, intermittent nausea, vomiting, and sacral wound check. Found to have UTI. Recent R knee surgery 4 weeks ago (TDWB). PMH: Afib, cirrhosis of liver, colon cancer, CAD, diabetes, HTN, history of GI bleed, obesity, BPH   Clinical Impression   Pt admitted for concerns listed above. PTA pt was at a rehab facility due to recent knee surgery with TDWB. At this time, pt presents with increased weakness and decreased activity tolerance. He required max A for rolling and repositioning in bed and max A +2 to long sit. OT recommends pt return to SNF to continue therapy and maximize functional mobility and ADL performance. OT will follow acutely.     Follow Up Recommendations  SNF    Equipment Recommendations  None recommended by OT    Recommendations for Other Services       Precautions / Restrictions Precautions Precautions: Fall Precaution Comments: recent R knee surgery (TDWB) Restrictions Weight Bearing Restrictions: Yes RLE Weight Bearing: Touchdown weight bearing      Mobility Bed Mobility Overal bed mobility: Needs Assistance Bed Mobility: Rolling Rolling: Max assist         General bed mobility comments: able to initiate rolling towards R however requires maxA to complete rolling onto R side. Able to assist repositioning higher in bed with use of bed rail above head but still required maxA+2. Patient able to pull self to long sit with HOB elevated and maxA+2    Transfers                 General transfer comment: deferred due to patient complaining of nausea and recent episode of emesis    Balance                                           ADL either performed or assessed with clinical judgement   ADL Overall ADL's : Needs  assistance/impaired Eating/Feeding: Set up;Bed level   Grooming: Set up;Bed level   Upper Body Bathing: Moderate assistance;Bed level   Lower Body Bathing: Total assistance;+2 for physical assistance;+2 for safety/equipment;Bed level   Upper Body Dressing : Minimal assistance;Bed level   Lower Body Dressing: Total assistance;+2 for physical assistance;+2 for safety/equipment;Bed level                 General ADL Comments: Pt is bed level at this time due to increased weakness and nausea at this time.     Vision Baseline Vision/History: Wears glasses Wears Glasses: Reading only Patient Visual Report: No change from baseline Vision Assessment?: No apparent visual deficits     Perception Perception Perception Tested?: No   Praxis Praxis Praxis tested?: Not tested    Pertinent Vitals/Pain Pain Assessment: No/denies pain     Hand Dominance Right   Extremity/Trunk Assessment Upper Extremity Assessment Upper Extremity Assessment: Generalized weakness   Lower Extremity Assessment Lower Extremity Assessment: Defer to PT evaluation       Communication Communication Communication: No difficulties   Cognition Arousal/Alertness: Awake/alert Behavior During Therapy: WFL for tasks assessed/performed Overall Cognitive Status: Within Functional Limits for tasks assessed  General Comments  VSS on RA    Exercises     Shoulder Instructions      Home Living Family/patient expects to be discharged to:: Skilled nursing facility                                        Prior Functioning/Environment Level of Independence: Needs assistance  Gait / Transfers Assistance Needed: primarily bed bound at SNF due to inability to maintain TDWB in standing ADL's / Homemaking Assistance Needed: requires totalA for ADLs per patient. Rolls to perform pericare            OT Problem List: Decreased  strength;Decreased range of motion;Decreased activity tolerance;Decreased coordination;Decreased safety awareness;Decreased knowledge of use of DME or AE;Obesity;Pain      OT Treatment/Interventions: Self-care/ADL training;Therapeutic exercise;Energy conservation;DME and/or AE instruction;Therapeutic activities;Patient/family education;Balance training    OT Goals(Current goals can be found in the care plan section) Acute Rehab OT Goals Patient Stated Goal: to get better OT Goal Formulation: With patient Time For Goal Achievement: 03/30/21 Potential to Achieve Goals: Fair ADL Goals Pt Will Perform Upper Body Bathing: with min guard assist;sitting Pt Will Perform Upper Body Dressing: with min guard assist;sitting Pt Will Transfer to Toilet: with mod assist;with +2 assist;squat pivot transfer Additional ADL Goal #1: Pt will complete bed mobility with Mod A +2 Additional ADL Goal #2: Pt will tolerate sitting EOB for 5 mins with min g for support  OT Frequency: Min 2X/week   Barriers to D/C:            Co-evaluation PT/OT/SLP Co-Evaluation/Treatment: Yes Reason for Co-Treatment: For patient/therapist safety;Complexity of the patient's impairments (multi-system involvement);To address functional/ADL transfers PT goals addressed during session: Mobility/safety with mobility;Strengthening/ROM OT goals addressed during session: ADL's and self-care;Strengthening/ROM      AM-PAC OT "6 Clicks" Daily Activity     Outcome Measure Help from another person eating meals?: A Little Help from another person taking care of personal grooming?: A Little Help from another person toileting, which includes using toliet, bedpan, or urinal?: Total Help from another person bathing (including washing, rinsing, drying)?: A Lot Help from another person to put on and taking off regular upper body clothing?: A Little Help from another person to put on and taking off regular lower body clothing?: Total 6 Click  Score: 13   End of Session Nurse Communication: Mobility status  Activity Tolerance: Patient limited by fatigue Patient left: in bed;with call bell/phone within reach  OT Visit Diagnosis: Unsteadiness on feet (R26.81);Other abnormalities of gait and mobility (R26.89);Muscle weakness (generalized) (M62.81)                Time: 1003-1020 OT Time Calculation (min): 17 min Charges:  OT General Charges $OT Visit: 1 Visit OT Evaluation $OT Eval Moderate Complexity: 1 Mod  Leveon Pelzer H., OTR/L Acute Rehabilitation  Kathyrn [place] Elane Yolanda Bonine 03/16/2021, 5:25 PM

## 2021-03-16 NOTE — NC FL2 (Signed)
[place] LEVEL OF CARE SCREENING TOOL     IDENTIFICATION  Patient Name: Brandon [name] Birthdate: Feb 19, 1946 Sex: male Admission Date (Current Location): 03/15/2021  [facility] and [place] Number:  Herbalist and Address:  [facility], [place] [address] [place],  [address]      Provider Number: [id number]  Attending Physician Name and Address:  Bonnielee Haff, MD  Relative Name and Phone Number:       Current Level of Care: Hospital Recommended Level of Care: [place] Prior Approval Number:    Date Approved/Denied:   PASRR Number: [id number]  Discharge Plan: SNF    Current Diagnoses: Patient Active Problem List   Diagnosis Date Noted   Pressure injury of skin 03/16/2021   Generalized weakness 03/15/2021   Disp supracondyl fx w/o intracondylar extension of right distal femur ([place]) 02/05/2021   Hypomagnesemia 02/04/2021   Fall at home, initial encounter 02/04/2021   Cirrhosis of liver ([place])    Chronic heart failure with preserved ejection fraction (HFpEF) ([place]) 06/07/2020   Hyperkalemia 06/07/2020   Weakness generalized 06/07/2020   Physical deconditioning 06/06/2020   Hypothermia 05/31/2020   Bullous pemphigoid [id number]   Alcoholic cirrhosis of liver with ascites ([place])    Portal hypertensive gastropathy ([place])    Melena 10/02/2019   Angiodysplasia of stomach with hemorrhage    Gastrointestinal hemorrhage    Atrial fibrillation (H[facility])    Chronic pain syndrome    Symptomatic anemia 08/13/2019   Acute upper GI bleed 08/13/2019   Anemia 07/2019   Avulsion of right patellar tendon    Closed displaced fracture of phalanx of toe of right foot 10/06/2018   Obesity BMI over 52 09/30/2018   Patella fracture, extensor mechanism disruption R knee  09/29/2018   History of colon cancer 08/23/2018   CAD (coronary artery disease) 08/23/2018   DM (diabetes mellitus), type 2 ([place]) 08/23/2018   Pyuria  08/23/2018    Orientation RESPIRATION BLADDER Height & Weight     Self, Time, Situation, Place  Normal Incontinent, External catheter Weight: 297 lb 12.8 oz (135.1 kg) Height:  '5\' 6"'$  (167.6 cm)  BEHAVIORAL SYMPTOMS/MOOD NEUROLOGICAL BOWEL NUTRITION STATUS      Continent Diet (see dc summary)  AMBULATORY STATUS COMMUNICATION OF NEEDS Skin   Extensive Assist Verbally PU Stage and Appropriate Care, Surgical wounds (Stage II on buttocks; closed incision on knee)                       Personal Care Assistance Level of Assistance  Bathing, Feeding, Dressing Bathing Assistance: Maximum assistance Feeding assistance: Limited assistance Dressing Assistance: Limited assistance     Functional Limitations Info             SPECIAL CARE FACTORS FREQUENCY  PT (By licensed PT), OT (By licensed OT)     PT Frequency: 5x/week OT Frequency: 5x/week            Contractures Contractures Info: Not present    Additional Factors Info  Code Status, Allergies, Insulin Sliding Scale Code Status Info: DNR Allergies Info: NKA   Insulin Sliding Scale Info: see dc summary       Current Medications (03/16/2021):  This is the current hospital active medication list Current Facility-Administered Medications  Medication Dose Route Frequency Provider Last Rate Last Admin   0.9 %  sodium chloride infusion   Intravenous Continuous Shawna Clamp, MD 75 mL/hr at 03/16/21 0817 New Bag at  03/16/21 0817   acetaminophen (TYLENOL) tablet 650 mg  650 mg Oral Q6H PRN Shawna Clamp, MD       Or   acetaminophen (TYLENOL) suppository 650 mg  650 mg Rectal Q6H PRN Shawna Clamp, MD       carvedilol (COREG) tablet 25 mg  25 mg Oral BID WC Shawna Clamp, MD   25 mg at 03/16/21 0815   cefTRIAXone (ROCEPHIN) 1 g in sodium chloride 0.9 % 100 mL IVPB  1 g Intravenous Q24H Shawna Clamp, MD       docusate sodium (COLACE) capsule 100 mg  100 mg Oral BID Shawna Clamp, MD   100 mg at 03/16/21 0815    enoxaparin (LOVENOX) injection 70 mg  70 mg Subcutaneous Q24H Shawna Clamp, MD   70 mg at 03/15/21 2145   finasteride (PROSCAR) tablet 5 mg  5 mg Oral Daily Shawna Clamp, MD   5 mg at 03/16/21 0815   HYDROcodone-acetaminophen (NORCO/VICODIN) 5-325 MG per tablet 1-2 tablet  1-2 tablet Oral Q4H PRN Shawna Clamp, MD       insulin aspart (novoLOG) injection 0-6 Units  0-6 Units Subcutaneous TID WC Shawna Clamp, MD       lactulose (CHRONULAC) 10 GM/15ML solution 20 g  20 g Oral BID Bonnielee Haff, MD   20 g at 03/16/21 0930   ondansetron (ZOFRAN) tablet 4 mg  4 mg Oral Q6H PRN Shawna Clamp, MD       Or   ondansetron [facility]) injection 4 mg  4 mg Intravenous Q6H PRN Shawna Clamp, MD       pantoprazole (PROTONIX) EC tablet 40 mg  40 mg Oral Daily Shawna Clamp, MD   40 mg at 03/16/21 0815   pravastatin (PRAVACHOL) tablet 10 mg  10 mg Oral QHS Shawna Clamp, MD   10 mg at 03/15/21 2144   senna (SENOKOT) tablet 8.6 mg  1 tablet Oral BID Shawna Clamp, MD   8.6 mg at 03/16/21 0815   tamsulosin (FLOMAX) capsule 0.4 mg  0.4 mg Oral QHS Shawna Clamp, MD   0.4 mg at 03/15/21 2144     Discharge Medications: Please see discharge summary for a list of discharge medications.  Relevant Imaging Results:  Relevant Lab Results:   Additional Information SSI [id number]. [employer] 02/12/20, 03/05/20  Benard Halsted, LCSW

## 2021-03-17 DIAGNOSIS — S31000A Unspecified open wound of lower back and pelvis without penetration into retroperitoneum, initial encounter: Secondary | ICD-10-CM

## 2021-03-17 LAB — BASIC METABOLIC PANEL
Anion gap: 9 (ref 5–15)
BUN: 37 mg/dL — ABNORMAL HIGH (ref 8–23)
CO2: 23 mmol/L (ref 22–32)
Calcium: 9.3 mg/dL (ref 8.9–10.3)
Chloride: 103 mmol/L (ref 98–111)
Creatinine, Ser: 1.24 mg/dL (ref 0.61–1.24)
GFR, Estimated: >60 mL/min (ref >60)
Glucose, Bld: 117 mg/dL — ABNORMAL HIGH (ref 70–99)
Potassium: 3.9 mmol/L (ref 3.5–5.1)
Sodium: 135 mmol/L (ref 135–145)

## 2021-03-17 LAB — CBC
HCT: 40.8 % (ref 39.0–52.0)
Hemoglobin: 11.8 g/dL — ABNORMAL LOW (ref 13.0–17.0)
MCH: 28.4 pg (ref 26.0–34.0)
MCHC: 28.9 g/dL — ABNORMAL LOW (ref 30.0–36.0)
MCV: 98.1 fL (ref 80.0–100.0)
Platelets: 185 10*3/uL (ref 150–400)
RBC: 4.16 MIL/uL — ABNORMAL LOW (ref 4.22–5.81)
RDW: 14.5 % (ref 11.5–15.5)
WBC: 6.6 10*3/uL (ref 4.0–10.5)
nRBC: 0 % (ref 0.0–0.2)

## 2021-03-17 LAB — GLUCOSE, CAPILLARY
Glucose-Capillary: 102 mg/dL — ABNORMAL HIGH (ref 70–99)
Glucose-Capillary: 157 mg/dL — ABNORMAL HIGH (ref 70–99)
Glucose-Capillary: 138 mg/dL — ABNORMAL HIGH (ref 70–99)
Glucose-Capillary: 110 mg/dL — ABNORMAL HIGH (ref 70–99)

## 2021-03-17 LAB — AMMONIA: Ammonia: 63 umol/L — ABNORMAL HIGH (ref 9–35)

## 2021-03-17 NOTE — Progress Notes (Signed)
[facility] PROGRESS NOTE   Brandon [name] [id number] DOB: 1945-12-06 DOA: 03/15/2021  PCP: Helane Rima, MD  Brief History/Interval Summary: 75 y.o. male with PMH significant for atrial fibrillation, cirrhosis of liver, colon cancer, coronary artery disease, diabetes, hypertension, history of GI bleed, obesity, BPH,  s/p right knee surgery 4 weeks ago on therapeutic Lovenox was sent from skilled nursing facility for generalized weakness, intermittent nausea, vomiting and for sacral wound check.  Patient was found to have urinary tract infection.  He was hospitalized for further management.    Consultants: None  Procedures: None  Antibiotics: Anti-infectives (From admission, onward)    Start     Dose/Rate Route Frequency Ordered Stop   03/16/21 1700  cefTRIAXone (ROCEPHIN) 1 g in sodium chloride 0.9 % 100 mL IVPB        1 g 200 mL/hr over 30 Minutes Intravenous Every 24 hours 03/15/21 1724     03/15/21 1730  cefTRIAXone (ROCEPHIN) 1 g in sodium chloride 0.9 % 100 mL IVPB  Status:  Discontinued        1 g 200 mL/hr over 30 Minutes Intravenous Every 24 hours 03/15/21 1724 03/15/21 1724   03/15/21 1700  cefTRIAXone (ROCEPHIN) 1 g in sodium chloride 0.9 % 100 mL IVPB        1 g 200 mL/hr over 30 Minutes Intravenous  Once 03/15/21 1651 03/15/21 1847        Subjective/Interval History: Patient's mentation seems to be better this morning.  He denies any abdominal pain nausea or vomiting at this time.  Feels hungry.     Assessment/Plan:  Urinary tract infection with gram-negative bacteria Patient did report dysuria.  His UA was noted to be abnormal.  He did have a Foley catheter during previous hospitalization.  Patient with history of BPH with chronic bladder outlet obstruction and has had difficulty voiding urine in the past Remains on ceftriaxone.  Urine culture with greater than 100,000 colonies of gram-negative bacteria.  Waiting on final identification and  sensitivities Lactic acid level improved.  TSH was normal.  PT and OT evaluation.  Will need to go back to skilled nursing facility to continue rehab.  Acute kidney injury Baseline creatinine 0.89.  Presented with creatinine of 1.5.  Renal function improved with IV hydration.  Avoid nephrotoxic agents.  Monitor urine output.    History of liver cirrhosis with hyperammonemia Although patient did not have clear-cut encephalopathy he was noted to be distracted.  He did have some asterixis.  Patient's ammonia level has improved with the lactulose.  Continue for now.    Atrial fibrillation Was not on long-term anticoagulation due to history of liver cirrhosis and prior GI bleed.  He was discharged on DVT prophylaxis with Lovenox after his distal right femoral fracture in July.  Diabetes mellitus type 2, controlled HbA1c 6.3.  Monitor CBGs.  SSI.  Essential hypertension Continue carvedilol.  Blood pressure is reasonably well controlled.  Status post surgery for recent right distal femoral fracture  Seems to be stable.  Wound is healing well.  Good mobility in the right knee is noted.  Stage II sacral decubitus, present on admission Developed this while he was in the nursing home.  Wound care nurse. Pressure Injury 03/15/21 Buttocks Right Stage 2 -  Partial thickness loss of dermis presenting as a shallow open injury with a red, pink wound bed without slough. Sloughing noted pink and tan areas on buttock (Active)  03/15/21 2029  Location: Buttocks  Location Orientation:  Right  Staging: Stage 2 -  Partial thickness loss of dermis presenting as a shallow open injury with a red, pink wound bed without slough.  Wound Description (Comments): Sloughing noted pink and tan areas on buttock  Present on Admission: Yes     Pressure Injury 03/16/21 Buttocks Left;Medial Stage 2 -  Partial thickness loss of dermis presenting as a shallow open injury with a red, pink wound bed without slough. (Active)   03/16/21 1456  Location: Buttocks  Location Orientation: Left;Medial  Staging: Stage 2 -  Partial thickness loss of dermis presenting as a shallow open injury with a red, pink wound bed without slough.  Wound Description (Comments):   Present on Admission: Yes    Class III obesity Estimated body mass index is 48.07 kg/m as calculated from the following:   Height as of this encounter: '5\' 6"'$  (1.676 m).   Weight as of this encounter: 135.1 kg.   DVT Prophylaxis: On Lovenox Code Status: DNR Family Communication: No family at bedside Disposition Plan: Hopefully return to SNF when improved  Status is: Inpatient  Remains inpatient appropriate because:IV treatments appropriate due to intensity of illness or inability to take PO and Inpatient level of care appropriate due to severity of illness  Dispo:  Patient From: [facility]  Planned Disposition: [place]  Medically stable for discharge: No           Medications: Scheduled:  carvedilol  25 mg Oral BID WC   docusate sodium  100 mg Oral BID   enoxaparin  70 mg Subcutaneous Q24H   finasteride  5 mg Oral Daily   insulin aspart  0-6 Units Subcutaneous TID WC   lactulose  20 g Oral BID   liver oil-zinc oxide   Topical TID   pantoprazole  40 mg Oral Daily   pravastatin  10 mg Oral QHS   senna  1 tablet Oral BID   tamsulosin  0.4 mg Oral QHS   Continuous:  sodium chloride 75 mL/hr at 03/16/21 2139   cefTRIAXone (ROCEPHIN)  IV 1 g (03/16/21 1618)   [id number] **OR** acetaminophen, HYDROcodone-acetaminophen, ondansetron **OR** ondansetron (ZOFRAN) IV   Objective:  Vital Signs  Vitals:   03/16/21 1136 03/16/21 2009 03/17/21 0413 03/17/21 0721  BP: 136/61 103/65 (!) 108/56 116/70  Pulse: 75 (!) 104 75 85  Resp: '17 20 16 16  '$ Temp: 99 F (37.2 C) 98.4 F (36.9 C) 97.7 F (36.5 C) 98 F (36.7 C)  TempSrc: Axillary Oral Oral Oral  SpO2: 92%  95% 95%  Weight:      Height:         Intake/Output Summary (Last 24 hours) at 03/17/2021 1117 Last data filed at 03/17/2021 0900 Gross per 24 hour  Intake 1311.39 ml  Output 700 ml  Net 611.39 ml    Filed Weights   03/15/21 1506 03/15/21 2029  Weight: (!) 152.2 kg 135.1 kg    General appearance: Awake alert.  In no distress.  Less distracted Resp: Clear to auscultation bilaterally.  Normal effort Cardio: S1-S2 is normal regular.  No S3-S4.  No rubs murmurs or bruit GI: Abdomen is soft.  Nontender nondistended.  Bowel sounds are present normal.  No masses organomegaly Extremities: No edema.  Full range of motion of lower extremities. Neurologic: No focal deficits noted.  No asterixis noted today.    Lab Results:  Data Reviewed: I have personally reviewed following labs and imaging studies  CBC: Recent Labs  Lab  03/15/21 1521 03/15/21 1620 03/16/21 0300 03/17/21 0256  WBC 10.3  --  7.9 6.6  NEUTROABS 7.8*  --   --   --   HGB 13.4 15.0  15.0 13.0 11.8*  HCT 45.0 44.0  44.0 42.8 40.8  MCV 94.3  --  92.0 98.1  PLT 231  --  231 185     Basic Metabolic Panel: Recent Labs  Lab 03/15/21 1521 03/15/21 1620 03/16/21 0300 03/17/21 0256  NA 135 136  135 136 135  K 4.6 4.8  4.8 4.2 3.9  CL 97* 99 99 103  CO2 27  --  25 23  GLUCOSE 131* 130* 137* 117*  BUN 44* 56* 45* 37*  CREATININE 1.53* 1.40* 1.42* 1.24  CALCIUM 10.0  --  9.9 9.3  MG  --   --  1.7  --   PHOS  --   --  3.0  --      GFR: Estimated Creatinine Clearance: 68.2 mL/min (by C-G formula based on SCr of 1.24 mg/dL).  Liver Function Tests: Recent Labs  Lab 03/15/21 1521 03/16/21 0300  AST 12* 15  ALT 8 8  ALKPHOS 104 103  BILITOT 0.8 0.7  PROT 6.5 6.4*  ALBUMIN 2.7* 2.6*      Recent Labs  Lab 03/15/21 1523 03/16/21 0300 03/17/21 0256  AMMONIA 97* 101* 63*     Coagulation Profile: Recent Labs  Lab 03/15/21 1521  INR 1.2      HbA1C: Recent Labs    03/16/21 0300  HGBA1C 6.3*     CBG: Recent Labs   Lab 03/16/21 0809 03/16/21 1131 03/16/21 1628 03/16/21 2124 03/17/21 0720  GLUCAP 136* 204* 231* 145* 102*      Thyroid Function Tests: Recent Labs    03/15/21 1722  TSH 1.028      Recent Results (from the past 240 hour(s))  Urine Culture     Status: Abnormal (Preliminary result)   Collection Time: 03/15/21  3:21 PM   Specimen: In/Out Cath Urine  Result Value Ref Range Status   Specimen Description IN/OUT CATH URINE  Final   Special Requests NONE  Final   Culture (A)  Final    >=100,000 COLONIES/mL GRAM NEGATIVE RODS SUSCEPTIBILITIES TO FOLLOW Performed at [facility], [place] [address], [place], [place] [address]    Report Status PENDING  Incomplete  Blood Culture (routine x 2)     Status: None (Preliminary result)   Collection Time: 03/15/21  3:35 PM   Specimen: BLOOD RIGHT HAND  Result Value Ref Range Status   Specimen Description BLOOD RIGHT HAND  Final   Special Requests   Final    BOTTLES DRAWN AEROBIC AND ANAEROBIC Blood Culture results may not be optimal due to an inadequate volume of blood received in culture bottles   Culture   Final    NO GROWTH < 24 HOURS Performed at [facility], [place] [address] [place], [place] [address]    Report Status PENDING  Incomplete  Blood Culture (routine x 2)     Status: None (Preliminary result)   Collection Time: 03/15/21  3:35 PM   Specimen: BLOOD LEFT HAND  Result Value Ref Range Status   Specimen Description BLOOD LEFT HAND  Final   Special Requests   Final    BOTTLES DRAWN AEROBIC AND ANAEROBIC Blood Culture results may not be optimal due to an inadequate volume of blood received in culture bottles   Culture   Final  NO GROWTH < 24 HOURS Performed at [facility] [address], [place], [place] [address]    Report Status PENDING  Incomplete  Resp Panel by RT-PCR (Flu A&B, Covid) Nasopharyngeal Swab     Status: None   Collection Time: 03/15/21  7:30 PM   Specimen: Nasopharyngeal Swab;  Nasopharyngeal(NP) swabs in vial transport medium  Result Value Ref Range Status   SARS Coronavirus 2 by RT PCR NEGATIVE NEGATIVE Final    Comment: (NOTE) SARS-CoV-2 target nucleic acids are NOT DETECTED.  The SARS-CoV-2 RNA is generally detectable in upper respiratory specimens during the acute phase of infection. The lowest concentration of SARS-CoV-2 viral copies this assay can detect is 138 copies/mL. A negative result does not preclude SARS-Cov-2 infection and should not be used as the sole basis for treatment or other patient management decisions. A negative result may occur with  improper specimen collection/handling, submission of specimen other than nasopharyngeal swab, presence of viral mutation(s) within the areas targeted by this assay, and inadequate number of viral copies(<138 copies/mL). A negative result must be combined with clinical observations, patient history, and epidemiological information. The expected result is Negative.  Fact Sheet for Patients:  EntrepreneurPulse.com.au  Fact Sheet for Healthcare Providers:  IncredibleEmployment.be  This test is no t yet approved or cleared by the [place] FDA and  has been authorized for detection and/or diagnosis of SARS-CoV-2 by FDA under an Emergency Use Authorization (EUA). This EUA will remain  in effect (meaning this test can be used) for the duration of the COVID-19 declaration under Section 564(b)(1) of the Act, 21 U.S.C.section 360bbb-3(b)(1), unless the authorization is terminated  or revoked sooner.       Influenza A by PCR NEGATIVE NEGATIVE Final   Influenza B by PCR NEGATIVE NEGATIVE Final    Comment: (NOTE) The Xpert Xpress SARS-CoV-2/FLU/RSV plus assay is intended as an aid in the diagnosis of influenza from Nasopharyngeal swab specimens and should not be used as a sole basis for treatment. Nasal washings and aspirates are unacceptable for Xpert Xpress  SARS-CoV-2/FLU/RSV testing.  Fact Sheet for Patients: EntrepreneurPulse.com.au  Fact Sheet for Healthcare Providers: IncredibleEmployment.be  This test is not yet approved or cleared by the [place] FDA and has been authorized for detection and/or diagnosis of SARS-CoV-2 by FDA under an Emergency Use Authorization (EUA). This EUA will remain in effect (meaning this test can be used) for the duration of the COVID-19 declaration under Section 564(b)(1) of the Act, 21 U.S.C. section 360bbb-3(b)(1), unless the authorization is terminated or revoked.  Performed at [facility], [place] [address], [place], [place] [address]        Radiology Studies: DG Chest 1 View  Result Date: 03/15/2021 CLINICAL DATA:  Sacral osteomyelitis, weakness EXAM: CHEST  1 VIEW COMPARISON:  02/08/2021 FINDINGS: Single frontal view of the chest demonstrates an unremarkable cardiac silhouette. Postsurgical changes from median sternotomy. Stable atherosclerosis of the aorta. No airspace disease, effusion, or pneumothorax. No acute bony abnormality. IMPRESSION: 1. Stable chest, no acute process. Electronically Signed   By: Randa Ngo M.D.   On: 03/15/2021 17:02   DG Sacrum/Coccyx  Result Date: 03/15/2021 CLINICAL DATA:  Wound EXAM: SACRUM AND COCCYX - 2+ VIEW COMPARISON:  02/03/2021 FINDINGS: Pubic symphysis and rami are intact. No SI joint widening. No fracture or malalignment. No definitive osseous destructive change. IMPRESSION: No definite acute osseous abnormality. Cross-sectional imaging follow-up if continued suspicion for infection Electronically Signed   By: Donavan Foil  M.D.   On: 03/15/2021 16:47   DG Knee 1-2 Views Right  Result Date: 03/15/2021 CLINICAL DATA:  Knee pain EXAM: RIGHT KNEE - 1-2 VIEW COMPARISON:  02/04/2021 FINDINGS: Partially visualized surgical plate and fixating screws across comminuted extra-articular distal femoral fracture with  residual lucency and no abundant callus. Patellar fracture with less apparent lucency suggests some interval healing. Diffuse soft tissue swelling with effusion. Advanced arthritis of the knee. IMPRESSION: 1. Partially visualized surgical plate and fixating screws in the distal femur across comminuted distal femoral fracture without much interval bridging callus. 2. Redemonstrated patella fracture with less visible lucency suggestive of some healing 3. Arthritis of the knee Electronically Signed   By: Donavan Foil M.D.   On: 03/15/2021 19:40       LOS: 1 day   [facility] Pager on [email or web address]  03/17/2021, 11:17 AM

## 2021-03-17 NOTE — TOC Initial Note (Signed)
Transition of Care [facility]) - Initial/Assessment Note    Patient Details  Name: Brandon [name] MRN: [id number] Date of Birth: 1946-05-18  [facility]) CM/SW Contact:    Bary Castilla, LCSW Phone Number: [phone] 03/17/2021, 10:51 AM  Clinical Narrative:                  CSW was alerted that pt may not want to return to [facility]. CSW spoke with pt bedside and he informed CSW that it was going better at facility therefore he would return. CSW received permission to speak with son via phone while in pt room. Son stated that it was the pt decision. It was decided that pt would return to [facility] attempted to call [place] to ascertain what is needed for pt to return at dc and had to send a text, VM full.  TOC team will continue to assist with discharge planning needs.    Expected Discharge Plan: Skilled Nursing Facility Barriers to Discharge: Continued Medical Work up, Ship broker   Patient Goals and CMS Choice Patient states their goals for this hospitalization and ongoing recovery are:: To return home      Expected Discharge Plan and Services Expected Discharge Plan: [place] arrangements for the past 2 months: [place]                                      Prior Living Arrangements/Services Living arrangements for the past 2 months: [place] Lives with:: Facility Resident Patient language and need for interpreter reviewed:: Yes          Care giver support system in place?: Yes (comment)      Activities of Daily Living Home Assistive Devices/Equipment: Eyeglasses, Oxygen, Other (Comment) (bedbound) ADL Screening (condition at time of admission) Patient's cognitive ability adequate to safely complete daily activities?: Yes Is the patient deaf or have difficulty hearing?: No Does the patient have difficulty seeing, even when wearing  glasses/contacts?: No Does the patient have difficulty concentrating, remembering, or making decisions?: Yes Patient able to express need for assistance with ADLs?: Yes Does the patient have difficulty dressing or bathing?: Yes Independently performs ADLs?: No Communication: Needs assistance Is this a change from baseline?: Pre-admission baseline Dressing (OT): Needs assistance Is this a change from baseline?: Pre-admission baseline Grooming: Needs assistance Is this a change from baseline?: Pre-admission baseline Feeding: Needs assistance Is this a change from baseline?: Pre-admission baseline Bathing: Needs assistance Is this a change from baseline?: Pre-admission baseline Toileting: Needs assistance Is this a change from baseline?: Pre-admission baseline In/Out Bed: Needs assistance Is this a change from baseline?: Pre-admission baseline Walks in Home: Needs assistance, Dependent Is this a change from baseline?: Pre-admission baseline Does the patient have difficulty walking or climbing stairs?: Yes Weakness of Legs: Both Weakness of Arms/Hands: Both  Permission Sought/Granted   Permission granted to share information with : Yes, Verbal Permission Granted  Share Information with NAME: son - stephen  Permission granted to share info w AGENCY: [employer] granted to share info w Relationship: Son  Permission granted to share info w Contact Information: [phone]  Emotional Assessment Appearance:: Appears stated age Attitude/Demeanor/Rapport: Engaged Affect (typically observed): Accepting, Adaptable Orientation: : Oriented to Self, Oriented to Place, Oriented to  Time, Oriented to  Situation      Admission diagnosis:  Dehydration [E86.0] Neck pain [M54.2] Generalized weakness [R53.1] Wound of sacral region [S31.000A] Wound of sacral region, initial encounter [id number] Urinary tract infection without hematuria, site unspecified [N39.0] UTI (urinary  tract infection) [N39.0] Patient Active Problem List   Diagnosis Date Noted   Pressure injury of skin 03/16/2021   UTI (urinary tract infection) 03/16/2021   Generalized weakness 03/15/2021   Disp supracondyl fx w/o intracondylar extension of right distal femur ([place]) 02/05/2021   Hypomagnesemia 02/04/2021   Fall at home, initial encounter 02/04/2021   Cirrhosis of liver (HCC)    Chronic heart failure with preserved ejection fraction (HFpEF) ([place]) 06/07/2020   Hyperkalemia 06/07/2020   Weakness generalized 06/07/2020   Physical deconditioning 06/06/2020   Hypothermia 05/31/2020   Bullous pemphigoid [id number]   Alcoholic cirrhosis of liver with ascites ([place])    Portal hypertensive gastropathy ([place])    Melena 10/02/2019   Angiodysplasia of stomach with hemorrhage    Gastrointestinal hemorrhage    Atrial fibrillation (HCC)    Chronic pain syndrome    Symptomatic anemia 08/13/2019   Acute upper GI bleed 08/13/2019   Anemia 07/2019   Avulsion of right patellar tendon    Closed displaced fracture of phalanx of toe of right foot 10/06/2018   Obesity BMI over 52 09/30/2018   Patella fracture, extensor mechanism disruption R knee  09/29/2018   History of colon cancer 08/23/2018   CAD (coronary artery disease) 08/23/2018   DM (diabetes mellitus), type 2 ([place]) 08/23/2018   Pyuria 08/23/2018   PCP:  Helane Rima, MD Pharmacy:   [id number] - [place], [place] - [address] AT [place] [address] [place] [address] Phone: [phone] Fax: [phone]     Social Determinants of Health (SDOH) Interventions    Readmission Risk Interventions Readmission Risk Prevention Plan 10/06/2019 10/06/2019  Transportation Screening - Complete  PCP or Specialist Appt within 3-5 Days Complete -  HRI or [place] - Complete  Social Work Consult for [place] Planning/Counseling - Complete  Palliative Care Screening - Not  Applicable  Medication Review Press photographer) - Complete

## 2021-03-18 LAB — URINE CULTURE: Culture: >=100000 — AB

## 2021-03-18 LAB — RESP PANEL BY RT-PCR (FLU A&B, COVID) ARPGX2
Influenza A by PCR: NEGATIVE
Influenza B by PCR: NEGATIVE
SARS Coronavirus 2 by RT PCR: NEGATIVE

## 2021-03-18 LAB — CBC
HCT: 37.7 % — ABNORMAL LOW (ref 39.0–52.0)
Hemoglobin: 11.4 g/dL — ABNORMAL LOW (ref 13.0–17.0)
MCH: 27.9 pg (ref 26.0–34.0)
MCHC: 30.2 g/dL (ref 30.0–36.0)
MCV: 92.4 fL (ref 80.0–100.0)
Platelets: 185 10*3/uL (ref 150–400)
RBC: 4.08 MIL/uL — ABNORMAL LOW (ref 4.22–5.81)
RDW: 14 % (ref 11.5–15.5)
WBC: 6.6 10*3/uL (ref 4.0–10.5)
nRBC: 0 % (ref 0.0–0.2)

## 2021-03-18 LAB — BASIC METABOLIC PANEL
Anion gap: 8 (ref 5–15)
BUN: 23 mg/dL (ref 8–23)
CO2: 24 mmol/L (ref 22–32)
Calcium: 9.3 mg/dL (ref 8.9–10.3)
Chloride: 103 mmol/L (ref 98–111)
Creatinine, Ser: 1.1 mg/dL (ref 0.61–1.24)
GFR, Estimated: >60 mL/min (ref >60)
Glucose, Bld: 100 mg/dL — ABNORMAL HIGH (ref 70–99)
Potassium: 3.7 mmol/L (ref 3.5–5.1)
Sodium: 135 mmol/L (ref 135–145)

## 2021-03-18 LAB — GLUCOSE, CAPILLARY
Glucose-Capillary: 111 mg/dL — ABNORMAL HIGH (ref 70–99)
Glucose-Capillary: 128 mg/dL — ABNORMAL HIGH (ref 70–99)
Glucose-Capillary: 177 mg/dL — ABNORMAL HIGH (ref 70–99)
Glucose-Capillary: 145 mg/dL — ABNORMAL HIGH (ref 70–99)

## 2021-03-18 MED ORDER — HYDROXYZINE HCL 25 MG PO TABS
25.0000 mg | ORAL_TABLET | Freq: Every day | ORAL | Status: DC | PRN
  Administered 2021-03-18: 25 mg via ORAL
  Filled 2021-03-18: qty 1

## 2021-03-18 MED ORDER — LACTULOSE 10 GM/15ML PO SOLN
20.0000 g | Freq: Every day | ORAL | Status: DC
  Administered 2021-03-19: 20 g via ORAL
  Filled 2021-03-18 (×2): qty 30

## 2021-03-18 MED ORDER — CIPROFLOXACIN HCL 500 MG PO TABS
500.0000 mg | ORAL_TABLET | Freq: Two times a day (BID) | ORAL | Status: DC
  Administered 2021-03-18 (×2): 500 mg via ORAL
  Filled 2021-03-18 (×2): qty 1

## 2021-03-18 NOTE — TOC Progression Note (Signed)
Transition of Care [facility]) - Progression Note    Patient Details  Name: Hue Feeman MRN: [id number] Date of Birth: 1945-12-19  Transition of Care [facility]) CM/SW [place], [place] Phone Number: 03/18/2021, 1:37 PM  Clinical Narrative:     CSW spoke with [name] from [facility] confirmed patient came from there short term. [name] confirmed patient will need insurance authorization to return . CSW started insurance authorization for patient. Candace Cruise ID# [id number]. Patient has SNF bed at [facility]. Insurance authorization is pending. Covid requested. CSW will continue to follow and assist with dc planning needs.  Expected Discharge Plan: [place] Barriers to Discharge: Continued Medical Work up, Ship broker  Expected Discharge Plan and Services Expected Discharge Plan: [place]       Living arrangements for the past 2 months: [place]                                       Social Determinants of Health (SDOH) Interventions    Readmission Risk Interventions Readmission Risk Prevention Plan 10/06/2019 10/06/2019  Transportation Screening - Complete  PCP or Specialist Appt within 3-5 Days Complete -  HRI or [place] - Complete  Social Work Consult for [place] Planning/Counseling - Complete  Palliative Care Screening - Not Applicable  Medication Review Press photographer) - Complete

## 2021-03-18 NOTE — Progress Notes (Signed)
[facility] PROGRESS NOTE   Brandon [name] [id number] DOB: 1946/05/31 DOA: 03/15/2021  PCP: Helane Rima, MD  Brief History/Interval Summary: 75 y.o. male with PMH significant for atrial fibrillation, cirrhosis of liver, colon cancer, coronary artery disease, diabetes, hypertension, history of GI bleed, obesity, BPH,  s/p right knee surgery 4 weeks ago on therapeutic Lovenox was sent from skilled nursing facility for generalized weakness, intermittent nausea, vomiting and for sacral wound check.  Patient was found to have urinary tract infection.  He was hospitalized for further management.    Consultants: None  Procedures: None  Antibiotics: Anti-infectives (From admission, onward)    Start     Dose/Rate Route Frequency Ordered Stop   03/18/21 1015  ciprofloxacin (CIPRO) tablet 500 mg        500 mg Oral 2 times daily 03/18/21 0920 03/20/21 1959   03/16/21 1700  cefTRIAXone (ROCEPHIN) 1 g in sodium chloride 0.9 % 100 mL IVPB  Status:  Discontinued        1 g 200 mL/hr over 30 Minutes Intravenous Every 24 hours 03/15/21 1724 03/18/21 0920   03/15/21 1730  cefTRIAXone (ROCEPHIN) 1 g in sodium chloride 0.9 % 100 mL IVPB  Status:  Discontinued        1 g 200 mL/hr over 30 Minutes Intravenous Every 24 hours 03/15/21 1724 03/15/21 1724   03/15/21 1700  cefTRIAXone (ROCEPHIN) 1 g in sodium chloride 0.9 % 100 mL IVPB        1 g 200 mL/hr over 30 Minutes Intravenous  Once 03/15/21 1651 03/15/21 1847        Subjective/Interval History: Patient states that he is feeling better.  However has been having frequent bowel movements.  Denies any abdominal pain nausea or vomiting.     Assessment/Plan:  Urinary tract infection with Enterobacter Patient did report dysuria.  His UA was noted to be abnormal.  He did have a Foley catheter during previous hospitalization.  Patient with history of BPH with chronic bladder outlet obstruction and has had difficulty voiding urine in the  past Patient was started on ceftriaxone.  Urine culture growing Enterobacter.  Sensitivities reviewed.  We will change him over to Cipro.   TSH was normal.  Will need to go back to skilled nursing facility to continue rehab.  Acute kidney injury Baseline creatinine 0.89.  Presented with creatinine of 1.5.  Renal function improved with IV hydration.  Avoid nephrotoxic agents.  Monitor urine output.    History of liver cirrhosis with hyperammonemia Although patient did not have clear-cut encephalopathy he was noted to be distracted.  He did have some asterixis.  Patient's ammonia level has improved with the lactulose.   Appears to be having frequent bowel movements.  We will discontinue stool softeners and Senokot.  We will cut back on the dose of the lactulose.  Atrial fibrillation Was not on long-term anticoagulation due to history of liver cirrhosis and prior GI bleed.  He was discharged on DVT prophylaxis with Lovenox after his distal right femoral fracture in July.  Diabetes mellitus type 2, controlled HbA1c 6.3.  Monitor CBGs.  SSI.  Essential hypertension Continue carvedilol.  Blood pressure is reasonably well controlled.  Status post surgery for recent right distal femoral fracture  Seems to be stable.  Wound is healing well.  Good mobility in the right knee is noted.  Stage II sacral decubitus, present on admission Developed this while he was in the nursing home.  Wound care nurse. Pressure Injury  03/15/21 Buttocks Right Stage 2 -  Partial thickness loss of dermis presenting as a shallow open injury with a red, pink wound bed without slough. Sloughing noted pink and tan areas on buttock (Active)  03/15/21 2029  Location: Buttocks  Location Orientation: Right  Staging: Stage 2 -  Partial thickness loss of dermis presenting as a shallow open injury with a red, pink wound bed without slough.  Wound Description (Comments): Sloughing noted pink and tan areas on buttock  Present on  Admission: Yes     Pressure Injury 03/16/21 Buttocks Left;Medial Stage 2 -  Partial thickness loss of dermis presenting as a shallow open injury with a red, pink wound bed without slough. (Active)  03/16/21 1456  Location: Buttocks  Location Orientation: Left;Medial  Staging: Stage 2 -  Partial thickness loss of dermis presenting as a shallow open injury with a red, pink wound bed without slough.  Wound Description (Comments):   Present on Admission: Yes    Class III obesity Estimated body mass index is 48.07 kg/m as calculated from the following:   Height as of this encounter: '5\' 6"'$  (1.676 m).   Weight as of this encounter: 135.1 kg.   DVT Prophylaxis: On Lovenox Code Status: DNR Family Communication: Son updated periodically Disposition Plan: Hopefully return to SNF when improved  Status is: Inpatient  Remains inpatient appropriate because:IV treatments appropriate due to intensity of illness or inability to take PO and Inpatient level of care appropriate due to severity of illness  Dispo:  Patient From: [facility]  Planned Disposition: [place]  Medically stable for discharge: No           Medications: Scheduled:  carvedilol  25 mg Oral BID WC   ciprofloxacin  500 mg Oral BID   enoxaparin  70 mg Subcutaneous Q24H   finasteride  5 mg Oral Daily   insulin aspart  0-6 Units Subcutaneous TID WC   [START ON 03/19/2021] lactulose  20 g Oral Daily   liver oil-zinc oxide   Topical TID   pantoprazole  40 mg Oral Daily   pravastatin  10 mg Oral QHS   tamsulosin  0.4 mg Oral QHS   Continuous:   [id number] **OR** acetaminophen, HYDROcodone-acetaminophen, hydrOXYzine, ondansetron **OR** ondansetron (ZOFRAN) IV   Objective:  Vital Signs  Vitals:   03/17/21 1620 03/17/21 1948 03/18/21 0349 03/18/21 0805  BP: 121/75 131/85 129/74 (!) 160/94  Pulse: (!) 52 85 88 82  Resp: (!) '22 17 17 18  '$ Temp: 98.5 F (36.9 C) 98 F (36.7 C)  98.6 F (37 C)   TempSrc: Oral Oral Oral Oral  SpO2:  95% 97% 97%  Weight:      Height:        Intake/Output Summary (Last 24 hours) at 03/18/2021 1059 Last data filed at 03/18/2021 0900 Gross per 24 hour  Intake 840 ml  Output 1300 ml  Net -460 ml    Filed Weights   03/15/21 1506 03/15/21 2029  Weight: (!) 152.2 kg 135.1 kg    General appearance: Awake alert.  In no distress Resp: Clear to auscultation bilaterally.  Normal effort Cardio: S1-S2 is normal regular.  No S3-S4.  No rubs murmurs or bruit GI: Abdomen is soft.  Nontender nondistended.  Bowel sounds are present normal.  No masses organomegaly Extremities: No edema.  Full range of motion of lower extremities. Neurologic: Alert and oriented x3.  No obvious focal deficits.  No asterixis noted today.  Lab Results:  Data Reviewed: I have personally reviewed following labs and imaging studies  CBC: Recent Labs  Lab 03/15/21 1521 03/15/21 1620 03/16/21 0300 03/17/21 0256 03/18/21 0304  WBC 10.3  --  7.9 6.6 6.6  NEUTROABS 7.8*  --   --   --   --   HGB 13.4 15.0  15.0 13.0 11.8* 11.4*  HCT 45.0 44.0  44.0 42.8 40.8 37.7*  MCV 94.3  --  92.0 98.1 92.4  PLT 231  --  231 185 185     Basic Metabolic Panel: Recent Labs  Lab 03/15/21 1521 03/15/21 1620 03/16/21 0300 03/17/21 0256 03/18/21 0304  NA 135 136  135 136 135 135  K 4.6 4.8  4.8 4.2 3.9 3.7  CL 97* 99 99 103 103  CO2 27  --  '25 23 24  '$ GLUCOSE 131* 130* 137* 117* 100*  BUN 44* 56* 45* 37* 23  CREATININE 1.53* 1.40* 1.42* 1.24 1.10  CALCIUM 10.0  --  9.9 9.3 9.3  MG  --   --  1.7  --   --   PHOS  --   --  3.0  --   --      GFR: Estimated Creatinine Clearance: 76.9 mL/min (by C-G formula based on SCr of 1.1 mg/dL).  Liver Function Tests: Recent Labs  Lab 03/15/21 1521 03/16/21 0300  AST 12* 15  ALT 8 8  ALKPHOS 104 103  BILITOT 0.8 0.7  PROT 6.5 6.4*  ALBUMIN 2.7* 2.6*      Recent Labs  Lab 03/15/21 1523 03/16/21 0300  03/17/21 0256  AMMONIA 97* 101* 63*     Coagulation Profile: Recent Labs  Lab 03/15/21 1521  INR 1.2      HbA1C: Recent Labs    03/16/21 0300  HGBA1C 6.3*     CBG: Recent Labs  Lab 03/17/21 0720 03/17/21 1145 03/17/21 1619 03/17/21 2146 03/18/21 0746  GLUCAP 102* 157* 138* 110* 111*      Thyroid Function Tests: Recent Labs    03/15/21 1722  TSH 1.028      Recent Results (from the past 240 hour(s))  Urine Culture     Status: Abnormal   Collection Time: 03/15/21  3:21 PM   Specimen: In/Out Cath Urine  Result Value Ref Range Status   Specimen Description IN/OUT CATH URINE  Final   Special Requests   Final    NONE Performed at [facility], [place] [address], [place], [place] [address]    Culture >=100,000 COLONIES/mL ENTEROBACTER CLOACAE (A)  Final   Report Status 03/18/2021 FINAL  Final   Organism ID, Bacteria ENTEROBACTER CLOACAE (A)  Final      Susceptibility   Enterobacter cloacae - MIC*    CEFAZOLIN >=64 RESISTANT Resistant     CEFEPIME <=0.12 SENSITIVE Sensitive     CIPROFLOXACIN <=0.25 SENSITIVE Sensitive     GENTAMICIN <=1 SENSITIVE Sensitive     IMIPENEM 0.5 SENSITIVE Sensitive     NITROFURANTOIN 32 SENSITIVE Sensitive     TRIMETH/SULFA <=20 SENSITIVE Sensitive     PIP/TAZO <=4 SENSITIVE Sensitive     * >=100,000 COLONIES/mL ENTEROBACTER CLOACAE  Blood Culture (routine x 2)     Status: None (Preliminary result)   Collection Time: 03/15/21  3:35 PM   Specimen: BLOOD RIGHT HAND  Result Value Ref Range Status   Specimen Description BLOOD RIGHT HAND  Final   Special Requests   Final    BOTTLES DRAWN AEROBIC AND  ANAEROBIC Blood Culture results may not be optimal due to an inadequate volume of blood received in culture bottles   Culture   Final    NO GROWTH 3 DAYS Performed at [facility], [place] [address], [place], [place] [address]    Report Status PENDING  Incomplete  Blood Culture (routine x 2)     Status: None (Preliminary  result)   Collection Time: 03/15/21  3:35 PM   Specimen: BLOOD LEFT HAND  Result Value Ref Range Status   Specimen Description BLOOD LEFT HAND  Final   Special Requests   Final    BOTTLES DRAWN AEROBIC AND ANAEROBIC Blood Culture results may not be optimal due to an inadequate volume of blood received in culture bottles   Culture   Final    NO GROWTH 3 DAYS Performed at [facility], [place] [address], [place], [place] [address]    Report Status PENDING  Incomplete  Resp Panel by RT-PCR (Flu A&B, Covid) Nasopharyngeal Swab     Status: None   Collection Time: 03/15/21  7:30 PM   Specimen: Nasopharyngeal Swab; Nasopharyngeal(NP) swabs in vial transport medium  Result Value Ref Range Status   SARS Coronavirus 2 by RT PCR NEGATIVE NEGATIVE Final    Comment: (NOTE) SARS-CoV-2 target nucleic acids are NOT DETECTED.  The SARS-CoV-2 RNA is generally detectable in upper respiratory specimens during the acute phase of infection. The lowest concentration of SARS-CoV-2 viral copies this assay can detect is 138 copies/mL. A negative result does not preclude SARS-Cov-2 infection and should not be used as the sole basis for treatment or other patient management decisions. A negative result may occur with  improper specimen collection/handling, submission of specimen other than nasopharyngeal swab, presence of viral mutation(s) within the areas targeted by this assay, and inadequate number of viral copies(<138 copies/mL). A negative result must be combined with clinical observations, patient history, and epidemiological information. The expected result is Negative.  Fact Sheet for Patients:  EntrepreneurPulse.com.au  Fact Sheet for Healthcare Providers:  IncredibleEmployment.be  This test is no t yet approved or cleared by the [place] FDA and  has been authorized for detection and/or diagnosis of SARS-CoV-2 by FDA under an Emergency Use  Authorization (EUA). This EUA will remain  in effect (meaning this test can be used) for the duration of the COVID-19 declaration under Section 564(b)(1) of the Act, 21 U.S.C.section 360bbb-3(b)(1), unless the authorization is terminated  or revoked sooner.       Influenza A by PCR NEGATIVE NEGATIVE Final   Influenza B by PCR NEGATIVE NEGATIVE Final    Comment: (NOTE) The Xpert Xpress SARS-CoV-2/FLU/RSV plus assay is intended as an aid in the diagnosis of influenza from Nasopharyngeal swab specimens and should not be used as a sole basis for treatment. Nasal washings and aspirates are unacceptable for Xpert Xpress SARS-CoV-2/FLU/RSV testing.  Fact Sheet for Patients: EntrepreneurPulse.com.au  Fact Sheet for Healthcare Providers: IncredibleEmployment.be  This test is not yet approved or cleared by the [place] FDA and has been authorized for detection and/or diagnosis of SARS-CoV-2 by FDA under an Emergency Use Authorization (EUA). This EUA will remain in effect (meaning this test can be used) for the duration of the COVID-19 declaration under Section 564(b)(1) of the Act, 21 U.S.C. section 360bbb-3(b)(1), unless the authorization is terminated or revoked.  Performed at [facility], [place] [address] [place], [place] [address]        Radiology Studies: No results  found.     LOS: 2 days   [facility] Pager on [email or web address]  03/18/2021, 10:59 AM

## 2021-03-19 LAB — BASIC METABOLIC PANEL
Anion gap: 7 (ref 5–15)
BUN: 19 mg/dL (ref 8–23)
CO2: 25 mmol/L (ref 22–32)
Calcium: 9.2 mg/dL (ref 8.9–10.3)
Chloride: 101 mmol/L (ref 98–111)
Creatinine, Ser: 1.08 mg/dL (ref 0.61–1.24)
GFR, Estimated: >60 mL/min (ref >60)
Glucose, Bld: 106 mg/dL — ABNORMAL HIGH (ref 70–99)
Potassium: 4.6 mmol/L (ref 3.5–5.1)
Sodium: 133 mmol/L — ABNORMAL LOW (ref 135–145)

## 2021-03-19 LAB — GLUCOSE, CAPILLARY
Glucose-Capillary: 105 mg/dL — ABNORMAL HIGH (ref 70–99)
Glucose-Capillary: 131 mg/dL — ABNORMAL HIGH (ref 70–99)

## 2021-03-19 MED ORDER — LACTULOSE 10 GM/15ML PO SOLN: 20.0000 g | Freq: Every day | ORAL | 0 refills | Status: DC

## 2021-03-19 MED ORDER — CIPROFLOXACIN HCL 500 MG PO TABS: 500.0000 mg | ORAL_TABLET | Freq: Two times a day (BID) | ORAL | 0 refills | Status: DC

## 2021-03-19 MED ORDER — GABAPENTIN 300 MG PO CAPS: 300.0000 mg | ORAL_CAPSULE | Freq: Three times a day (TID) | ORAL | Status: DC

## 2021-03-19 MED ORDER — CIPROFLOXACIN HCL 500 MG PO TABS
500.0000 mg | ORAL_TABLET | Freq: Two times a day (BID) | ORAL | Status: DC
  Administered 2021-03-19: 500 mg via ORAL
  Filled 2021-03-19: qty 1

## 2021-03-19 MED ORDER — HYDROCODONE-ACETAMINOPHEN 5-325 MG PO TABS: 1.0000 | ORAL_TABLET | ORAL | 0 refills | Status: DC | PRN

## 2021-03-19 MED ORDER — HYDROXYZINE HCL 25 MG PO TABS: 25.0000 mg | ORAL_TABLET | Freq: Every day | ORAL | 0 refills | Status: DC

## 2021-03-19 NOTE — Progress Notes (Signed)
Pt Report called out to [facility]

## 2021-03-19 NOTE — Discharge Summary (Signed)
[facility]  Physician Discharge Summary   Patient ID: Brandon [name] MRN: [id number] DOB/AGE: [id number] 74 y.o.  Admit date: 03/15/2021 Discharge date:   03/19/2021   PCP: Helane Rima, MD  DISCHARGE DIAGNOSES:  Urinary tract infection with Enterobacter Acute kidney injury, resolved Liver cirrhosis Hyperammonemia Paroxysmal atrial fibrillation Diabetes mellitus type 2, controlled Essential hypertension   RECOMMENDATIONS FOR OUTPATIENT FOLLOW UP: Please consult palliative care at the skilled nursing facility to go over goals of care with patient and family    Home Health: Going to SNF Equipment/Devices: None  CODE STATUS: DNR  DISCHARGE CONDITION: fair  Diet recommendation: Modified carbohydrate  INITIAL HISTORY: 75 y.o. male with PMH significant for atrial fibrillation, cirrhosis of liver, colon cancer, coronary artery disease, diabetes, hypertension, history of GI bleed, obesity, BPH,  s/p right knee surgery 4 weeks ago on therapeutic Lovenox was sent from skilled nursing facility for generalized weakness, intermittent nausea, vomiting and for sacral wound check.  Patient was found to have urinary tract infection.  He was hospitalized for further management.     HOSPITAL COURSE:   Urinary tract infection with Enterobacter Patient did report dysuria.  His UA was noted to be abnormal.  He did have a Foley catheter during previous hospitalization.  Patient with history of BPH with chronic bladder outlet obstruction and has had difficulty voiding urine in the past Patient was started on ceftriaxone.  Urine culture growing Enterobacter.  Sensitivities reviewed.  He was changed over to oral ciprofloxacin.  Will need treatment to 8/27.   TSH was normal.  Will need to go back to skilled nursing facility to continue rehab.  Acute kidney injury Baseline creatinine 0.89.  Presented with creatinine of 1.5.  Renal function improved with IV hydration.    History of  liver cirrhosis with hyperammonemia Although patient did not have clear-cut encephalopathy he was noted to be distracted.  He did have some asterixis.  Patient's ammonia level has improved with the lactulose.   Had to cut back on dose of lactulose and discontinue his Colace and MiraLAX due to frequent bowel movements. Since his renal function has not improved we can resume his furosemide and spironolactone.  Atrial fibrillation, paroxysmal Was not on long-term anticoagulation due to history of liver cirrhosis and prior GI bleed.  He was discharged on DVT prophylaxis with Lovenox after his distal right femoral fracture in July.  This will be continued till his ambulatory status improves.   Diabetes mellitus type 2, controlled HbA1c 6.3.  May resume home medications.  Essential hypertension Continue carvedilol.  Blood pressure is reasonably well controlled.  Status post surgery for recent right distal femoral fracture  Seems to be stable.  Wound is healing well.  Good mobility in the right knee is noted.   Stage II sacral decubitus, present on admission Developed this while he was in the nursing home.   Pressure Injury 03/15/21 Buttocks Right Stage 2 -  Partial thickness loss of dermis presenting as a shallow open injury with a red, pink wound bed without slough. Sloughing noted pink and tan areas on buttock (Active)  03/15/21 2029  Location: Buttocks  Location Orientation: Right  Staging: Stage 2 -  Partial thickness loss of dermis presenting as a shallow open injury with a red, pink wound bed without slough.  Wound Description (Comments): Sloughing noted pink and tan areas on buttock  Present on Admission: Yes     Pressure Injury 03/16/21 Buttocks Left;Medial Stage 2 -  Partial thickness loss of  dermis presenting as a shallow open injury with a red, pink wound bed without slough. (Active)  03/16/21 1456  Location: Buttocks  Location Orientation: Left;Medial  Staging: Stage 2 -  Partial  thickness loss of dermis presenting as a shallow open injury with a red, pink wound bed without slough.  Wound Description (Comments):   Present on Admission: Yes      Class III obesity Estimated body mass index is 48.07 kg/m as calculated from the following:   Height as of this encounter: '5\' 6"'$  (1.676 m).   Weight as of this encounter: 135.1 kg.  Patient is stable.  Okay for discharge back to SNF for rehabilitation.   PERTINENT LABS:  The results of significant diagnostics from this hospitalization (including imaging, microbiology, ancillary and laboratory) are listed below for reference.    Microbiology: Recent Results (from the past 240 hour(s))  Urine Culture     Status: Abnormal   Collection Time: 03/15/21  3:21 PM   Specimen: In/Out Cath Urine  Result Value Ref Range Status   Specimen Description IN/OUT CATH URINE  Final   Special Requests   Final    NONE Performed at [facility], [address], [place], [place] [address]    Culture >=100,000 COLONIES/mL ENTEROBACTER CLOACAE (A)  Final   Report Status 03/18/2021 FINAL  Final   Organism ID, Bacteria ENTEROBACTER CLOACAE (A)  Final      Susceptibility   Enterobacter cloacae - MIC*    CEFAZOLIN >=64 RESISTANT Resistant     CEFEPIME <=0.12 SENSITIVE Sensitive     CIPROFLOXACIN <=0.25 SENSITIVE Sensitive     GENTAMICIN <=1 SENSITIVE Sensitive     IMIPENEM 0.5 SENSITIVE Sensitive     NITROFURANTOIN 32 SENSITIVE Sensitive     TRIMETH/SULFA <=20 SENSITIVE Sensitive     PIP/TAZO <=4 SENSITIVE Sensitive     * >=100,000 COLONIES/mL ENTEROBACTER CLOACAE  Blood Culture (routine x 2)     Status: None (Preliminary result)   Collection Time: 03/15/21  3:35 PM   Specimen: BLOOD RIGHT HAND  Result Value Ref Range Status   Specimen Description BLOOD RIGHT HAND  Final   Special Requests   Final    BOTTLES DRAWN AEROBIC AND ANAEROBIC Blood Culture results may not be optimal due to an inadequate volume of blood received in  culture bottles   Culture   Final    NO GROWTH 4 DAYS Performed at [facility], [place] [address], [place], [place] [address]    Report Status PENDING  Incomplete  Blood Culture (routine x 2)     Status: None (Preliminary result)   Collection Time: 03/15/21  3:35 PM   Specimen: BLOOD LEFT HAND  Result Value Ref Range Status   Specimen Description BLOOD LEFT HAND  Final   Special Requests   Final    BOTTLES DRAWN AEROBIC AND ANAEROBIC Blood Culture results may not be optimal due to an inadequate volume of blood received in culture bottles   Culture   Final    NO GROWTH 4 DAYS Performed at [facility], [place] [address] [place], [place] [address]    Report Status PENDING  Incomplete  Resp Panel by RT-PCR (Flu A&B, Covid) Nasopharyngeal Swab     Status: None   Collection Time: 03/15/21  7:30 PM   Specimen: Nasopharyngeal Swab; Nasopharyngeal(NP) swabs in vial transport medium  Result Value Ref Range Status   SARS Coronavirus 2 by RT PCR NEGATIVE NEGATIVE Final  Comment: (NOTE) SARS-CoV-2 target nucleic acids are NOT DETECTED.  The SARS-CoV-2 RNA is generally detectable in upper respiratory specimens during the acute phase of infection. The lowest concentration of SARS-CoV-2 viral copies this assay can detect is 138 copies/mL. A negative result does not preclude SARS-Cov-2 infection and should not be used as the sole basis for treatment or other patient management decisions. A negative result may occur with  improper specimen collection/handling, submission of specimen other than nasopharyngeal swab, presence of viral mutation(s) within the areas targeted by this assay, and inadequate number of viral copies(<138 copies/mL). A negative result must be combined with clinical observations, patient history, and epidemiological information. The expected result is Negative.  Fact Sheet for Patients:  EntrepreneurPulse.com.au  Fact Sheet for Healthcare  Providers:  IncredibleEmployment.be  This test is no t yet approved or cleared by the [place] FDA and  has been authorized for detection and/or diagnosis of SARS-CoV-2 by FDA under an Emergency Use Authorization (EUA). This EUA will remain  in effect (meaning this test can be used) for the duration of the COVID-19 declaration under Section 564(b)(1) of the Act, 21 U.S.C.section 360bbb-3(b)(1), unless the authorization is terminated  or revoked sooner.       Influenza A by PCR NEGATIVE NEGATIVE Final   Influenza B by PCR NEGATIVE NEGATIVE Final    Comment: (NOTE) The Xpert Xpress SARS-CoV-2/FLU/RSV plus assay is intended as an aid in the diagnosis of influenza from Nasopharyngeal swab specimens and should not be used as a sole basis for treatment. Nasal washings and aspirates are unacceptable for Xpert Xpress SARS-CoV-2/FLU/RSV testing.  Fact Sheet for Patients: EntrepreneurPulse.com.au  Fact Sheet for Healthcare Providers: IncredibleEmployment.be  This test is not yet approved or cleared by the [place] FDA and has been authorized for detection and/or diagnosis of SARS-CoV-2 by FDA under an Emergency Use Authorization (EUA). This EUA will remain in effect (meaning this test can be used) for the duration of the COVID-19 declaration under Section 564(b)(1) of the Act, 21 U.S.C. section 360bbb-3(b)(1), unless the authorization is terminated or revoked.  Performed at [facility], [place] [address], [place], [place] [address]   Resp Panel by RT-PCR (Flu A&B, Covid) Nasopharyngeal Swab     Status: None   Collection Time: 03/18/21  4:14 PM   Specimen: Nasopharyngeal Swab; Nasopharyngeal(NP) swabs in vial transport medium  Result Value Ref Range Status   SARS Coronavirus 2 by RT PCR NEGATIVE NEGATIVE Final    Comment: (NOTE) SARS-CoV-2 target nucleic acids are NOT DETECTED.  The SARS-CoV-2 RNA is generally  detectable in upper respiratory specimens during the acute phase of infection. The lowest concentration of SARS-CoV-2 viral copies this assay can detect is 138 copies/mL. A negative result does not preclude SARS-Cov-2 infection and should not be used as the sole basis for treatment or other patient management decisions. A negative result may occur with  improper specimen collection/handling, submission of specimen other than nasopharyngeal swab, presence of viral mutation(s) within the areas targeted by this assay, and inadequate number of viral copies(<138 copies/mL). A negative result must be combined with clinical observations, patient history, and epidemiological information. The expected result is Negative.  Fact Sheet for Patients:  EntrepreneurPulse.com.au  Fact Sheet for Healthcare Providers:  IncredibleEmployment.be  This test is no t yet approved or cleared by the [place] FDA and  has been authorized for detection and/or diagnosis of SARS-CoV-2 by FDA under an Emergency Use Authorization (EUA). This EUA will remain  in  effect (meaning this test can be used) for the duration of the COVID-19 declaration under Section 564(b)(1) of the Act, 21 U.S.C.section 360bbb-3(b)(1), unless the authorization is terminated  or revoked sooner.       Influenza A by PCR NEGATIVE NEGATIVE Final   Influenza B by PCR NEGATIVE NEGATIVE Final    Comment: (NOTE) The Xpert Xpress SARS-CoV-2/FLU/RSV plus assay is intended as an aid in the diagnosis of influenza from Nasopharyngeal swab specimens and should not be used as a sole basis for treatment. Nasal washings and aspirates are unacceptable for Xpert Xpress SARS-CoV-2/FLU/RSV testing.  Fact Sheet for Patients: EntrepreneurPulse.com.au  Fact Sheet for Healthcare Providers: IncredibleEmployment.be  This test is not yet approved or cleared by the [place] FDA  and has been authorized for detection and/or diagnosis of SARS-CoV-2 by FDA under an Emergency Use Authorization (EUA). This EUA will remain in effect (meaning this test can be used) for the duration of the COVID-19 declaration under Section 564(b)(1) of the Act, 21 U.S.C. section 360bbb-3(b)(1), unless the authorization is terminated or revoked.  Performed at [facility], [place] [address], [place], [place] [address]      Labs:  COVID-19 Labs   Lab Results  Component Value Date   [place] 03/18/2021   [place] NEGATIVE 03/15/2021   [place] NEGATIVE 02/11/2021   [place] NEGATIVE 02/03/2021      Basic Metabolic Panel: Recent Labs  Lab 03/15/21 1521 03/15/21 1620 03/16/21 0300 03/17/21 0256 03/18/21 0304 03/19/21 0159  NA 135 136  135 136 135 135 133*  K 4.6 4.8  4.8 4.2 3.9 3.7 4.6  CL 97* 99 99 103 103 101  CO2 27  --  '25 23 24 25  '$ GLUCOSE 131* 130* 137* 117* 100* 106*  BUN 44* 56* 45* 37* 23 19  CREATININE 1.53* 1.40* 1.42* 1.24 1.10 1.08  CALCIUM 10.0  --  9.9 9.3 9.3 9.2  MG  --   --  1.7  --   --   --   PHOS  --   --  3.0  --   --   --    Liver Function Tests: Recent Labs  Lab 03/15/21 1521 03/16/21 0300  AST 12* 15  ALT 8 8  ALKPHOS 104 103  BILITOT 0.8 0.7  PROT 6.5 6.4*  ALBUMIN 2.7* 2.6*    Recent Labs  Lab 03/15/21 1523 03/16/21 0300 03/17/21 0256  AMMONIA 97* 101* 63*   CBC: Recent Labs  Lab 03/15/21 1521 03/15/21 1620 03/16/21 0300 03/17/21 0256 03/18/21 0304  WBC 10.3  --  7.9 6.6 6.6  NEUTROABS 7.8*  --   --   --   --   HGB 13.4 15.0  15.0 13.0 11.8* 11.4*  HCT 45.0 44.0  44.0 42.8 40.8 37.7*  MCV 94.3  --  92.0 98.1 92.4  PLT 231  --  231 185 185    BNP: BNP (last 3 results) Recent Labs    05/25/20 1458 03/15/21 1722  BNP 336.4* 131.1*     CBG: Recent Labs  Lab 03/18/21 0746 03/18/21 1130 03/18/21 1630 03/18/21 2105 03/19/21 0744  GLUCAP 111* 128* 177* 145* 105*      IMAGING STUDIES DG Chest 1 View  Result Date: 03/15/2021 CLINICAL DATA:  Sacral osteomyelitis, weakness EXAM: CHEST  1 VIEW COMPARISON:  02/08/2021 FINDINGS: Single frontal view of the chest demonstrates an unremarkable cardiac silhouette. Postsurgical changes from median sternotomy. Stable atherosclerosis of the aorta. No airspace disease, effusion,  or pneumothorax. No acute bony abnormality. IMPRESSION: 1. Stable chest, no acute process. Electronically Signed   By: Randa Ngo M.D.   On: 03/15/2021 17:02   DG Sacrum/Coccyx  Result Date: 03/15/2021 CLINICAL DATA:  Wound EXAM: SACRUM AND COCCYX - 2+ VIEW COMPARISON:  02/03/2021 FINDINGS: Pubic symphysis and rami are intact. No SI joint widening. No fracture or malalignment. No definitive osseous destructive change. IMPRESSION: No definite acute osseous abnormality. Cross-sectional imaging follow-up if continued suspicion for infection Electronically Signed   By: Donavan Foil M.D.   On: 03/15/2021 16:47   DG Knee 1-2 Views Right  Result Date: 03/15/2021 CLINICAL DATA:  Knee pain EXAM: RIGHT KNEE - 1-2 VIEW COMPARISON:  02/04/2021 FINDINGS: Partially visualized surgical plate and fixating screws across comminuted extra-articular distal femoral fracture with residual lucency and no abundant callus. Patellar fracture with less apparent lucency suggests some interval healing. Diffuse soft tissue swelling with effusion. Advanced arthritis of the knee. IMPRESSION: 1. Partially visualized surgical plate and fixating screws in the distal femur across comminuted distal femoral fracture without much interval bridging callus. 2. Redemonstrated patella fracture with less visible lucency suggestive of some healing 3. Arthritis of the knee Electronically Signed   By: Donavan Foil M.D.   On: 03/15/2021 19:40    DISCHARGE EXAMINATION: Vitals:   03/18/21 1953 03/19/21 0136 03/19/21 0318 03/19/21 0922  BP: 126/74 137/76 (!) 128/91   Pulse: 78 75 (!) 57 82   Resp: 18 (!) 22 16   Temp: 98.7 F (37.1 C)  98.1 F (36.7 C)   TempSrc: Oral  Oral   SpO2: 94% 97% 94% 95%  Weight:      Height:       General appearance: Awake alert.  In no distress Resp: Clear to auscultation bilaterally.  Normal effort Cardio: S1-S2 is normal regular.  No S3-S4.  No rubs murmurs or bruit GI: Abdomen is soft.  Nontender nondistended.  Bowel sounds are present normal.  No masses organomegaly    DISPOSITION: SNF  Discharge Instructions     Call MD for:  difficulty breathing, headache or visual disturbances   Complete by: As directed    Call MD for:  extreme fatigue   Complete by: As directed    Call MD for:  persistant dizziness or light-headedness   Complete by: As directed    Call MD for:  persistant nausea and vomiting   Complete by: As directed    Call MD for:  severe uncontrolled pain   Complete by: As directed    Call MD for:  temperature >100.4   Complete by: As directed    Discharge instructions   Complete by: As directed    Please review instructions on the discharge summary.  You were cared for by a hospitalist during your hospital stay. If you have any questions about your discharge medications or the care you received while you were in the hospital after you are discharged, you can call the unit and asked to speak with the hospitalist on call if the hospitalist that took care of you is not available. Once you are discharged, your primary care physician will handle any further medical issues. Please note that NO REFILLS for any discharge medications will be authorized once you are discharged, as it is imperative that you return to your primary care physician (or establish a relationship with a primary care physician if you do not have one) for your aftercare needs so that they can reassess your need for  medications and monitor your lab values. If you do not have a primary care physician, you can call [phone] for a physician referral.   Discharge  wound care:   Complete by: As directed    Wound care to Stage 3 pressure injury to right buttock and Stage 2 pressure injury to left buttock (R>L): Cleanse with NS, pat dry. Cover with xeroform gauze Kellie Simmering [phone]), top with dry gauze and cover with silicone foam dressing.  Lift silicone dressing twice daily and change xeroform gauze. Change twice daily and PRN soiling or dressing dislodgement.   Increase activity slowly   Complete by: As directed          Allergies as of 03/19/2021   No Known Allergies      Medication List     STOP taking these medications    docusate sodium 100 MG capsule Commonly known as: COLACE   nystatin powder Commonly known as: MYCOSTATIN/NYSTOP   polyethylene glycol 17 g packet Commonly known as: MIRALAX / GLYCOLAX   rosuvastatin 5 MG tablet Commonly known as: CRESTOR   triamcinolone cream 0.1 % Commonly known as: KENALOG       TAKE these medications    acetaminophen 500 MG tablet Commonly known as: TYLENOL Take 1,000-1,500 mg by mouth every 6 (six) hours as needed for moderate pain.   carvedilol 25 MG tablet Commonly known as: COREG Take 25 mg by mouth 2 (two) times daily with a meal.   ciprofloxacin 500 MG tablet Commonly known as: CIPRO Take 1 tablet (500 mg total) by mouth 2 (two) times daily for 4 days.   enoxaparin 30 MG/0.3ML injection Commonly known as: LOVENOX Inject 30 mg into the skin daily. This is taken in addition to the '40mg'$  dose, for a total of '70mg'$  per day What changed: Another medication with the same name was removed. Continue taking this medication, and follow the directions you see here.   enoxaparin 40 MG/0.4ML injection Commonly known as: LOVENOX Inject 40 mg into the skin daily. This is taken in addition to the '30mg'$  dose, for a total of '70mg'$  per day What changed: Another medication with the same name was removed. Continue taking this medication, and follow the directions you see here.   finasteride 5 MG  tablet Commonly known as: PROSCAR Take 1 tablet (5 mg total) by mouth daily.   furosemide 20 MG tablet Commonly known as: LASIX Take 20 mg by mouth 2 (two) times daily.   gabapentin 300 MG capsule Commonly known as: NEURONTIN Take 1 capsule (300 mg total) by mouth 3 (three) times daily. What changed: how much to take   HYDROcodone-acetaminophen 5-325 MG tablet Commonly known as: NORCO/VICODIN Take 1-2 tablets by mouth every 4 (four) hours as needed for moderate pain (pain score 4-6).   hydrOXYzine 25 MG tablet Commonly known as: ATARAX/VISTARIL Take 1 tablet (25 mg total) by mouth daily.   lactulose 10 GM/15ML solution Commonly known as: CHRONULAC Take 30 mLs (20 g total) by mouth daily. Start taking on: March 20, 2021   metFORMIN 500 MG 24 hr tablet Commonly known as: GLUCOPHAGE-XR Take 500 mg by mouth daily with breakfast.   ondansetron 4 MG tablet Commonly known as: ZOFRAN Take 1 tablet (4 mg total) by mouth every 6 (six) hours as needed for nausea.   pantoprazole 40 MG tablet Commonly known as: PROTONIX Take 1 tablet (40 mg total) by mouth daily.   pravastatin 10 MG tablet Commonly known as: PRAVACHOL Take 10 mg by  mouth at bedtime.   spironolactone 50 MG tablet Commonly known as: ALDACTONE Take 50 mg by mouth daily.   tamsulosin 0.4 MG Caps capsule Commonly known as: FLOMAX Take 0.4 mg by mouth at bedtime.               Discharge Care Instructions  (From admission, onward)           Start     Ordered   03/19/21 0000  Discharge wound care:       Comments: Wound care to Stage 3 pressure injury to right buttock and Stage 2 pressure injury to left buttock (R>L): Cleanse with NS, pat dry. Cover with xeroform gauze Kellie Simmering [phone]), top with dry gauze and cover with silicone foam dressing.  Lift silicone dressing twice daily and change xeroform gauze. Change twice daily and PRN soiling or dressing dislodgement.   03/19/21 1052               Follow-up Information     Helane Rima, MD Follow up in 1 week(s).   Specialty: Family Medicine Contact information: [address] [place] [address] [phone]                 TOTAL DISCHARGE TIME: 32 minutes  [facility] Pager on [email or web address]  03/19/2021, 10:52 AM

## 2021-03-19 NOTE — Care Management Important Message (Signed)
Important Message  Patient Details  Name: Quendarius Burrous MRN: [id number] Date of Birth: [birth year]   Medicare Important Message Given:  Yes     Quandre Polinski [address] 03/19/2021, 4:07 PM

## 2021-03-19 NOTE — TOC Transition Note (Signed)
Transition of Care [facility]) - CM/SW Discharge Note   Patient Details  Name: Brandon [name] MRN: [id number] Date of Birth: [birth year]  Transition of Care [facility]) CM/SW Contact:  Trula Ore, [place] Phone Number: 03/19/2021, 1:29 PM   Clinical Narrative:     Patient will DC to: [place]  Anticipated DC date: 03/19/2021  Family notified: [name]  Transport by: Corey Harold  ?  Per MD patient ready for DC to [facility]. RN, patient, patient's family, and facility notified of DC. Discharge Summary sent to facility. RN given number for report tele#[phone] RM# [phone]. DC packet on chart. DNR signed by MD attached to outside of patients DC packet.Ambulance transport requested for patient.  CSW signing off.   Final next level of care: Skilled Nursing Facility Barriers to Discharge: No Barriers Identified   Patient Goals and CMS Choice Patient states their goals for this hospitalization and ongoing recovery are:: to go to SNF CMS Medicare.gov Compare Post Acute Care list provided to:: Patient Choice offered to / list presented to : Patient  Discharge Placement              Patient chooses bed at:  [facility]) Patient to be transferred to facility by: [place] Name of family member notified: [name] Patient and family notified of of transfer: 03/19/21  Discharge Plan and Services                                     Social Determinants of Health (SDOH) Interventions     Readmission Risk Interventions Readmission Risk Prevention Plan 10/06/2019 10/06/2019  Transportation Screening - Complete  PCP or Specialist Appt within 3-5 Days Complete -  HRI or [place] - Complete  Social Work Consult for [place] Planning/Counseling - Complete  Palliative Care Screening - Not Applicable  Medication Review Press photographer) - Complete

## 2021-03-19 NOTE — TOC Progression Note (Addendum)
Transition of Care [facility]) - Progression Note    Patient Details  Name: Brandon [name] MRN: [id number] Date of Birth: 01-24-1946  Transition of Care [facility]) CM/SW [place], [place] Phone Number: 03/19/2021, 11:50 AM  Clinical Narrative:      Update- CSW spoke with [name] with [facility] who confirmed they can accept patient today for dc.  CSW received insurance approval for patient. [employer] ID# is [id number]. Patients approval dates 8/23-8/25. CSW left voicemail for [place] with [place] to update her on insurance approval and confirm they can accept patient today for dc. CSW awaiting callback.CSW will continue to follow and assist with dc planning needs.  Expected Discharge Plan: [place] Barriers to Discharge: Continued Medical Work up, Ship broker  Expected Discharge Plan and Services Expected Discharge Plan: [place]       Living arrangements for the past 2 months: [place] Expected Discharge Date: 03/19/21                                     Social Determinants of Health (SDOH) Interventions    Readmission Risk Interventions Readmission Risk Prevention Plan 10/06/2019 10/06/2019  Transportation Screening - Complete  PCP or Specialist Appt within 3-5 Days Complete -  HRI or [place] - Complete  Social Work Consult for [place] Planning/Counseling - Complete  Palliative Care Screening - Not Applicable  Medication Review [employer] photographer) - Complete

## 2021-03-19 NOTE — Progress Notes (Signed)
Physical Therapy Treatment Patient Details Name: Brandon [name] MRN: [id number] DOB: 09/10/1945 Today's Date: 03/19/2021    History of Present Illness 75 y/o male presented to ED on 8/19 for generalized weakness, intermittent nausea, vomiting, and sacral wound check. Found to have UTI. Recent R knee surgery 4 weeks ago (TDWB). PMH: Afib, cirrhosis of liver, colon cancer, CAD, diabetes, HTN, history of GI bleed, obesity, BPH    PT Comments    Pt reports he was mod I with use of a RW prior to his knee surgery. He reports he has not been transferred to a chair yet at the SNF. Thus, focused session on progressing pt to being OOB to encourage upright positioning to prepare for transfers to stand. Pt needing maxA to roll and place hoyer lift pad. Pt dependently lifted to chair with hoyer. Performed R knee exercises to increase AROM to pt tolerance. He was disoriented to time and situation this date. Will continue to follow acutely. Current recommendations remain appropriate.    Follow Up Recommendations  SNF (return to SNF)     Equipment Recommendations  None recommended by PT    Recommendations for Other Services       Precautions / Restrictions Precautions Precautions: Fall Precaution Comments: recent R knee surgery (TDWB) Restrictions Weight Bearing Restrictions: Yes RLE Weight Bearing: Touchdown weight bearing    Mobility  Bed Mobility Overal bed mobility: Needs Assistance Bed Mobility: Rolling Rolling: Max assist         General bed mobility comments: Cues to flex knees and adduct and reach to contralateral rail with UE, maxA to roll either direction to place hoyer lift pad.    Transfers Overall transfer level: Needs assistance Equipment used:  (lift)             General transfer comment: Dependent hoyer lift bed > recliner. Pt fatigued after lift and deferred attempts to transfer to stand today from recliner.  Ambulation/Gait             General Gait  Details: Unable at this time.   Stairs             Wheelchair Mobility    Modified Rankin (Stroke Patients Only)       Balance Overall balance assessment: Needs assistance                                          Cognition Arousal/Alertness: Awake/alert Behavior During Therapy: WFL for tasks assessed/performed Overall Cognitive Status: Impaired/Different from baseline Area of Impairment: Orientation;Memory                 Orientation Level: Disoriented to;Time;Situation   Memory: Decreased short-term memory         General Comments: Needs repeated reminders for hand placement with lift. Initially, pt reporting knee injury at beginning of August, stating it has not been long yet since, but then several minutes later when asked what the date was he reported "March" and year was "[id number]" then "2002".      Exercises General Exercises - Lower Extremity Long Arc Quad: Right;AROM;10 reps;Seated Heel Slides: AROM;Right;5 reps;Seated    General Comments General comments (skin integrity, edema, etc.): VSS on RA; denied nausea/dizziness/lightheadedness      Pertinent Vitals/Pain Pain Assessment: Faces Faces Pain Scale: Hurts little more Pain Location: scrotum with hoyer lift; R knee with passive flexion Pain Intervention(s): Monitored during session;Limited activity  within patient's tolerance;Repositioned    Home Living                      Prior Function            PT Goals (current goals can now be found in the care plan section) Acute Rehab PT Goals Patient Stated Goal: to get better PT Goal Formulation: With patient Time For Goal Achievement: 03/30/21 Potential to Achieve Goals: Fair Progress towards PT goals: Progressing toward goals    Frequency    Min 2X/week      PT Plan Current plan remains appropriate    Co-evaluation              AM-PAC PT "6 Clicks" Mobility   Outcome Measure  Help needed  turning from your back to your side while in a flat bed without using bedrails?: A Lot Help needed moving from lying on your back to sitting on the side of a flat bed without using bedrails?: Total Help needed moving to and from a bed to a chair (including a wheelchair)?: Total Help needed standing up from a chair using your arms (e.g., wheelchair or bedside chair)?: Total Help needed to walk in hospital room?: Total Help needed climbing 3-5 steps with a railing? : Total 6 Click Score: 7    End of Session Equipment Utilized During Treatment: Other (comment) (lift) Activity Tolerance: Patient tolerated treatment well Patient left: in chair;with call bell/phone within reach;with chair alarm set Nurse Communication: Mobility status;Need for lift equipment PT Visit Diagnosis: Unsteadiness on feet (R26.81);Other abnormalities of gait and mobility (R26.89);Muscle weakness (generalized) (M62.81);History of falling (Z91.81);Difficulty in walking, not elsewhere classified (R26.2)     Time: [id number] PT Time Calculation (min) (ACUTE ONLY): 26 min  Charges:  $Therapeutic Exercise: 8-22 mins $Therapeutic Activity: 8-22 mins                     Moishe Spice, PT, DPT Acute Rehabilitation Services  Pager: [phone] Office: [place] 03/19/2021, 12:16 PM

## 2021-03-20 ENCOUNTER — Emergency Department (HOSPITAL_COMMUNITY): Payer: Medicare Other

## 2021-03-20 ENCOUNTER — Encounter (HOSPITAL_COMMUNITY): Payer: Self-pay | Admitting: *Deleted

## 2021-03-20 ENCOUNTER — Inpatient Hospital Stay (HOSPITAL_COMMUNITY)
Admission: EM | Admit: 2021-03-20 | Discharge: 2021-03-24 | DRG: 689 | Disposition: A | Discharge status: Skilled Nursing Facility | Payer: Medicare Other | Source: Skilled Nursing Facility | Attending: Internal Medicine | Admitting: Internal Medicine

## 2021-03-20 ENCOUNTER — Other Ambulatory Visit: Payer: Self-pay

## 2021-03-20 DIAGNOSIS — Z87891 Personal history of nicotine dependence: Secondary | ICD-10-CM

## 2021-03-20 DIAGNOSIS — I482 Chronic atrial fibrillation, unspecified: Secondary | ICD-10-CM | POA: Diagnosis not present

## 2021-03-20 DIAGNOSIS — Z7984 Long term (current) use of oral hypoglycemic drugs: Secondary | ICD-10-CM

## 2021-03-20 DIAGNOSIS — K7030 Alcoholic cirrhosis of liver without ascites: Secondary | ICD-10-CM | POA: Diagnosis present

## 2021-03-20 DIAGNOSIS — K7460 Unspecified cirrhosis of liver: Secondary | ICD-10-CM | POA: Diagnosis not present

## 2021-03-20 DIAGNOSIS — R338 Other retention of urine: Secondary | ICD-10-CM | POA: Diagnosis present

## 2021-03-20 DIAGNOSIS — Z66 Do not resuscitate: Secondary | ICD-10-CM | POA: Diagnosis present

## 2021-03-20 DIAGNOSIS — Z20822 Contact with and (suspected) exposure to covid-19: Secondary | ICD-10-CM | POA: Diagnosis present

## 2021-03-20 DIAGNOSIS — G9341 Metabolic encephalopathy: Secondary | ICD-10-CM | POA: Diagnosis not present

## 2021-03-20 DIAGNOSIS — S72401D Unspecified fracture of lower end of right femur, subsequent encounter for closed fracture with routine healing: Secondary | ICD-10-CM

## 2021-03-20 DIAGNOSIS — E119 Type 2 diabetes mellitus without complications: Secondary | ICD-10-CM | POA: Diagnosis present

## 2021-03-20 DIAGNOSIS — N3090 Cystitis, unspecified without hematuria: Secondary | ICD-10-CM

## 2021-03-20 DIAGNOSIS — Z79899 Other long term (current) drug therapy: Secondary | ICD-10-CM

## 2021-03-20 DIAGNOSIS — Z85038 Personal history of other malignant neoplasm of large intestine: Secondary | ICD-10-CM

## 2021-03-20 DIAGNOSIS — E785 Hyperlipidemia, unspecified: Secondary | ICD-10-CM | POA: Diagnosis present

## 2021-03-20 DIAGNOSIS — I110 Hypertensive heart disease with heart failure: Secondary | ICD-10-CM | POA: Diagnosis present

## 2021-03-20 DIAGNOSIS — R4182 Altered mental status, unspecified: Secondary | ICD-10-CM

## 2021-03-20 DIAGNOSIS — R21 Rash and other nonspecific skin eruption: Secondary | ICD-10-CM | POA: Diagnosis present

## 2021-03-20 DIAGNOSIS — Z6841 Body Mass Index (BMI) 40.0 and over, adult: Secondary | ICD-10-CM | POA: Diagnosis not present

## 2021-03-20 DIAGNOSIS — I2510 Atherosclerotic heart disease of native coronary artery without angina pectoris: Secondary | ICD-10-CM | POA: Diagnosis not present

## 2021-03-20 DIAGNOSIS — I5032 Chronic diastolic (congestive) heart failure: Secondary | ICD-10-CM | POA: Diagnosis present

## 2021-03-20 DIAGNOSIS — L89312 Pressure ulcer of right buttock, stage 2: Secondary | ICD-10-CM | POA: Diagnosis present

## 2021-03-20 DIAGNOSIS — W19XXXD Unspecified fall, subsequent encounter: Secondary | ICD-10-CM | POA: Diagnosis present

## 2021-03-20 DIAGNOSIS — N401 Enlarged prostate with lower urinary tract symptoms: Secondary | ICD-10-CM | POA: Diagnosis present

## 2021-03-20 DIAGNOSIS — N390 Urinary tract infection, site not specified: Principal | ICD-10-CM | POA: Diagnosis present

## 2021-03-20 DIAGNOSIS — I4891 Unspecified atrial fibrillation: Secondary | ICD-10-CM | POA: Diagnosis present

## 2021-03-20 DIAGNOSIS — Z800 Family history of malignant neoplasm of digestive organs: Secondary | ICD-10-CM | POA: Diagnosis not present

## 2021-03-20 DIAGNOSIS — Z7189 Other specified counseling: Secondary | ICD-10-CM | POA: Diagnosis not present

## 2021-03-20 DIAGNOSIS — L8990 Pressure ulcer of unspecified site, unspecified stage: Secondary | ICD-10-CM | POA: Diagnosis present

## 2021-03-20 DIAGNOSIS — Z515 Encounter for palliative care: Secondary | ICD-10-CM | POA: Diagnosis not present

## 2021-03-20 DIAGNOSIS — Z951 Presence of aortocoronary bypass graft: Secondary | ICD-10-CM | POA: Diagnosis not present

## 2021-03-20 DIAGNOSIS — K7682 Hepatic encephalopathy: Secondary | ICD-10-CM | POA: Diagnosis present

## 2021-03-20 DIAGNOSIS — E669 Obesity, unspecified: Secondary | ICD-10-CM | POA: Diagnosis present

## 2021-03-20 LAB — CULTURE, BLOOD (ROUTINE X 2)
Culture: NO GROWTH
Culture: NO GROWTH

## 2021-03-20 LAB — CBC
HCT: 42 % (ref 39.0–52.0)
Hemoglobin: 12.6 g/dL — ABNORMAL LOW (ref 13.0–17.0)
MCH: 28.4 pg (ref 26.0–34.0)
MCHC: 30 g/dL (ref 30.0–36.0)
MCV: 94.8 fL (ref 80.0–100.0)
Platelets: 169 10*3/uL (ref 150–400)
RBC: 4.43 MIL/uL (ref 4.22–5.81)
RDW: 14.4 % (ref 11.5–15.5)
WBC: 6.8 10*3/uL (ref 4.0–10.5)
nRBC: 0 % (ref 0.0–0.2)

## 2021-03-20 LAB — URINALYSIS, ROUTINE W REFLEX MICROSCOPIC
Bilirubin Urine: NEGATIVE
Glucose, UA: NEGATIVE mg/dL
Ketones, ur: NEGATIVE mg/dL
Nitrite: NEGATIVE
Protein, ur: 100 mg/dL — AB
Specific Gravity, Urine: 1.013 (ref 1.005–1.030)
WBC, UA: >50 WBC/hpf — ABNORMAL HIGH (ref 0–5)
pH: 7 (ref 5.0–8.0)

## 2021-03-20 LAB — BRAIN NATRIURETIC PEPTIDE: B Natriuretic Peptide: 168.3 pg/mL — ABNORMAL HIGH (ref 0.0–100.0)

## 2021-03-20 LAB — PROTIME-INR
INR: 1.1 (ref 0.8–1.2)
Prothrombin Time: 14 seconds (ref 11.4–15.2)

## 2021-03-20 LAB — CBG MONITORING, ED: Glucose-Capillary: 106 mg/dL — ABNORMAL HIGH (ref 70–99)

## 2021-03-20 LAB — RESP PANEL BY RT-PCR (FLU A&B, COVID) ARPGX2
Influenza A by PCR: NEGATIVE
Influenza B by PCR: NEGATIVE
SARS Coronavirus 2 by RT PCR: NEGATIVE

## 2021-03-20 LAB — COMPREHENSIVE METABOLIC PANEL
ALT: 9 U/L (ref 0–44)
AST: 12 U/L — ABNORMAL LOW (ref 15–41)
Albumin: 2.9 g/dL — ABNORMAL LOW (ref 3.5–5.0)
Alkaline Phosphatase: 94 U/L (ref 38–126)
Anion gap: 8 (ref 5–15)
BUN: 18 mg/dL (ref 8–23)
CO2: 26 mmol/L (ref 22–32)
Calcium: 9.9 mg/dL (ref 8.9–10.3)
Chloride: 106 mmol/L (ref 98–111)
Creatinine, Ser: 1.01 mg/dL (ref 0.61–1.24)
GFR, Estimated: >60 mL/min (ref >60)
Glucose, Bld: 103 mg/dL — ABNORMAL HIGH (ref 70–99)
Potassium: 4.2 mmol/L (ref 3.5–5.1)
Sodium: 140 mmol/L (ref 135–145)
Total Bilirubin: 0.5 mg/dL (ref 0.3–1.2)
Total Protein: 6.6 g/dL (ref 6.5–8.1)

## 2021-03-20 LAB — GLUCOSE, CAPILLARY: Glucose-Capillary: 173 mg/dL — ABNORMAL HIGH (ref 70–99)

## 2021-03-20 LAB — AMMONIA: Ammonia: 29 umol/L (ref 9–35)

## 2021-03-20 IMAGING — CT CT HEAD W/O CM
3 series · 16 of 47 positions shown, 19 images · non-contrast
Comparison: [DATE]

CLINICAL DATA: Delirium, history of urinary tract infection, chest
pain

EXAM:
CT HEAD WITHOUT CONTRAST
TECHNIQUE: Contiguous axial images were obtained from the base of the skull
through the vertex without intravenous contrast.

[Series 2: head wo · axial · 0.47mm/px · z∈[-368,-228]mm · 10 of 34 slices shown, 13 images]
[im 3/34  brain]
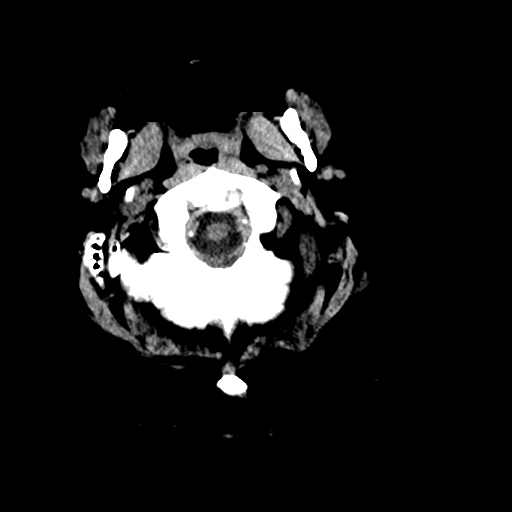
[im 3/34  bone]
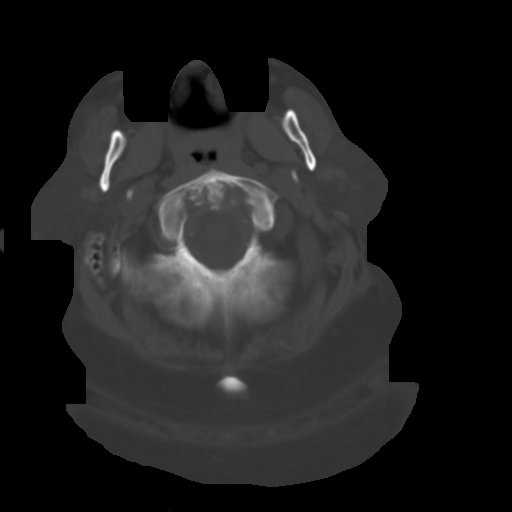
[im 6/34  brain]
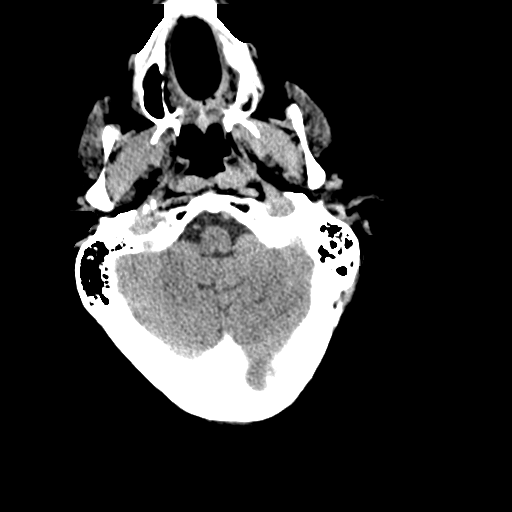
[im 10/34  brain]
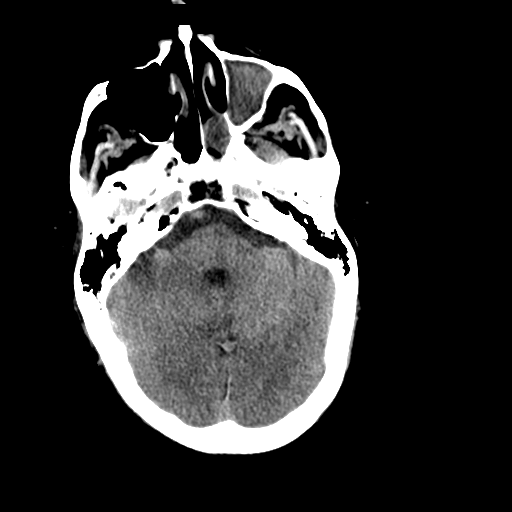
[im 12/34  brain]
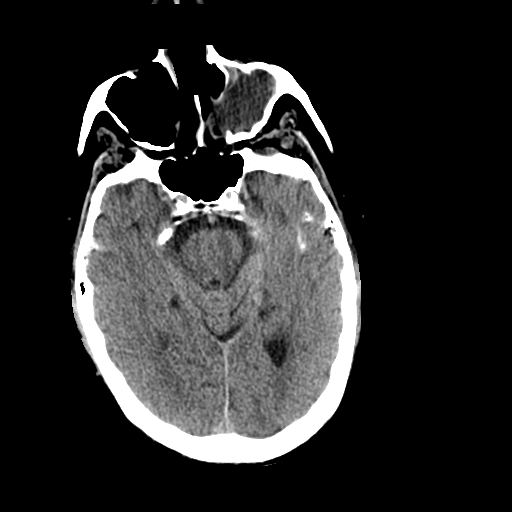
[im 15/34  brain]
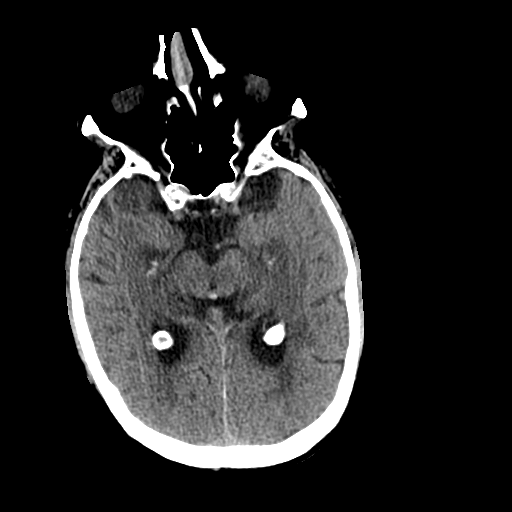
[im 15/34  bone]
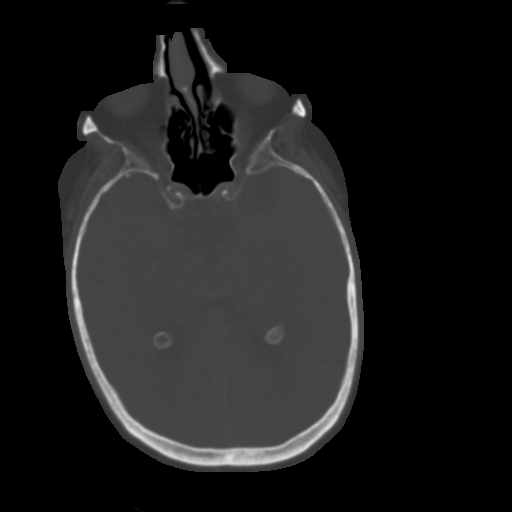
[im 19/34  brain]
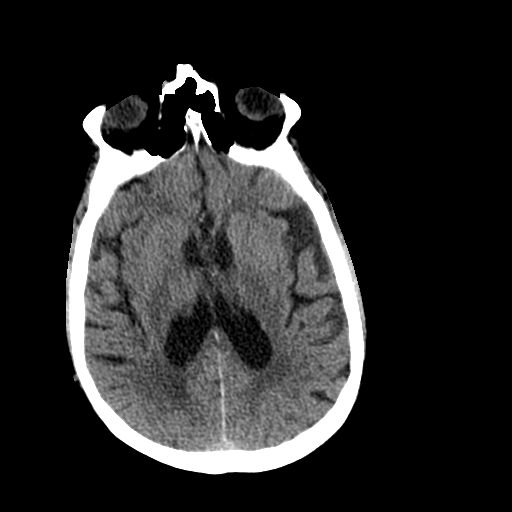
[im 22/34  brain]
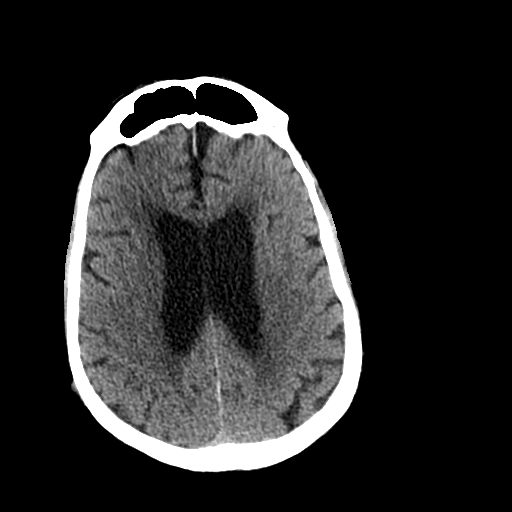
[im 26/34  brain]
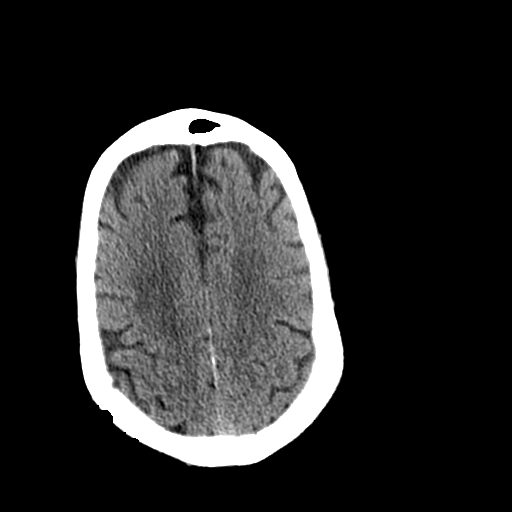
[im 28/34  brain]
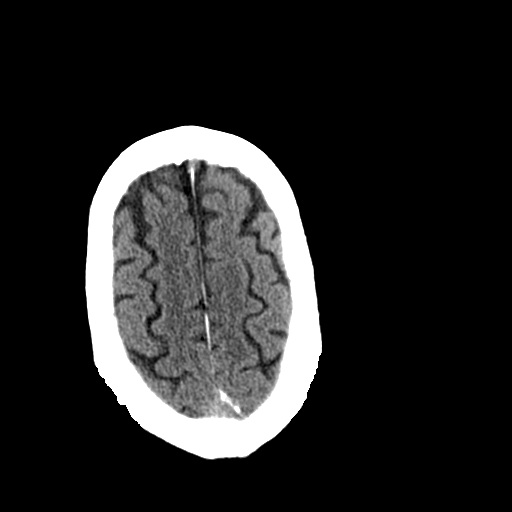
[im 28/34  bone]
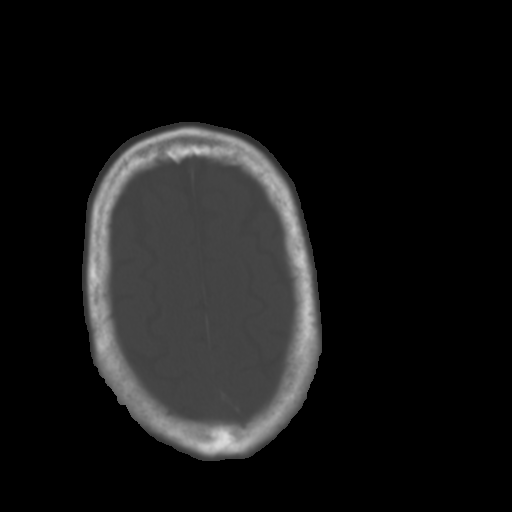
[im 31/34  brain]
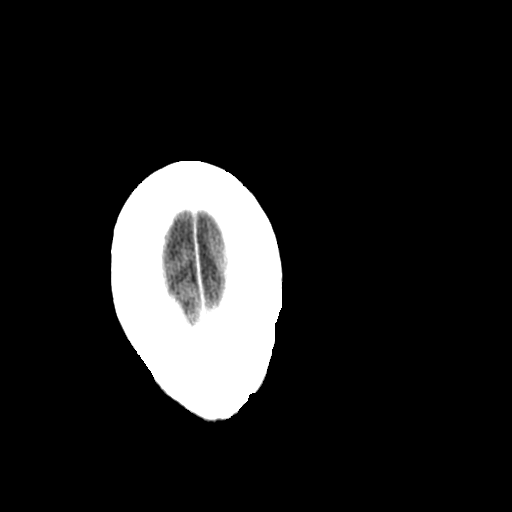

[Series 4: coronal soft tissue · coronal · 0.36mm/px · 3 of 84 slices shown]
[im 28/84  brain]
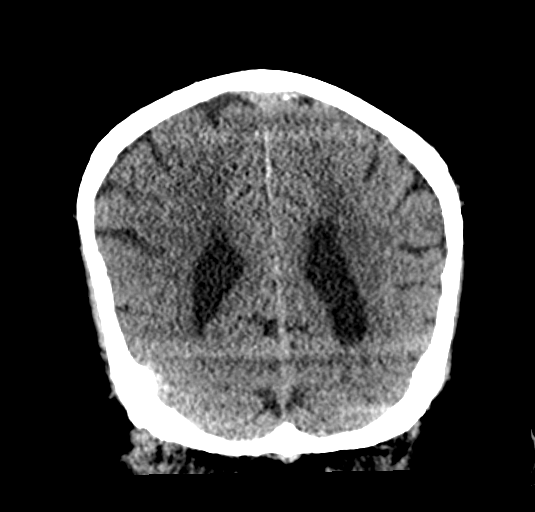
[im 37/84  brain]
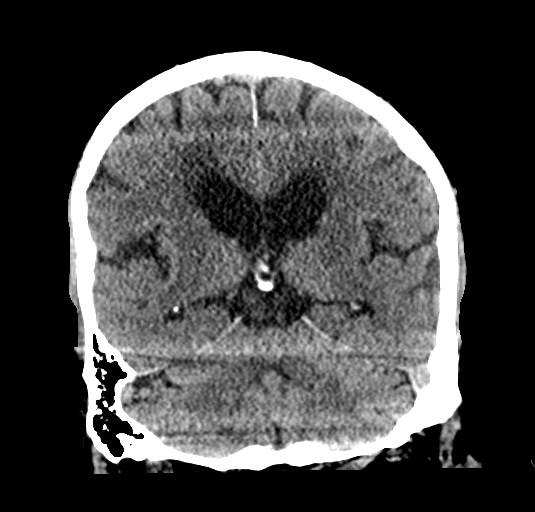
[im 47/84  brain]
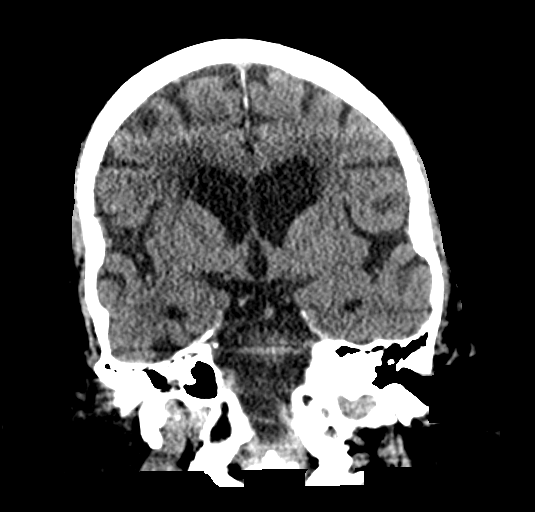

[Series 5: sagittal soft tissue · sagittal · 0.35mm/px · 3 of 66 slices shown]
[im 22/66  brain]
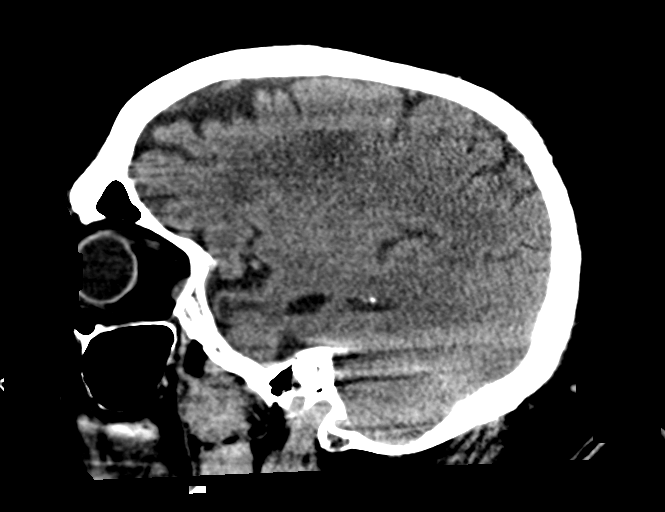
[im 33/66  brain]
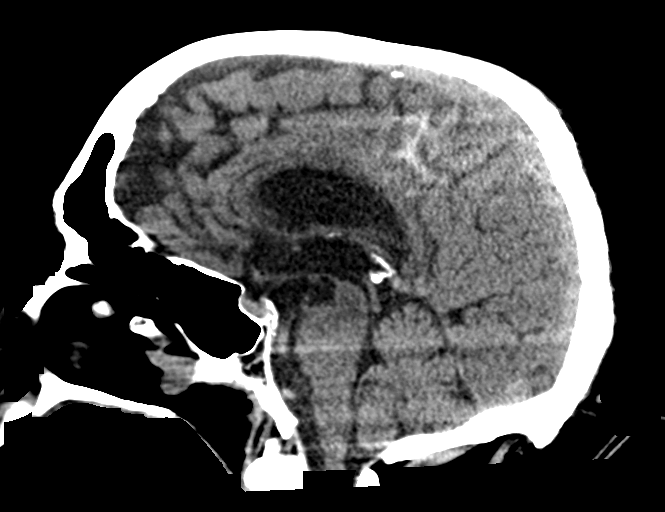
[im 44/66  brain]
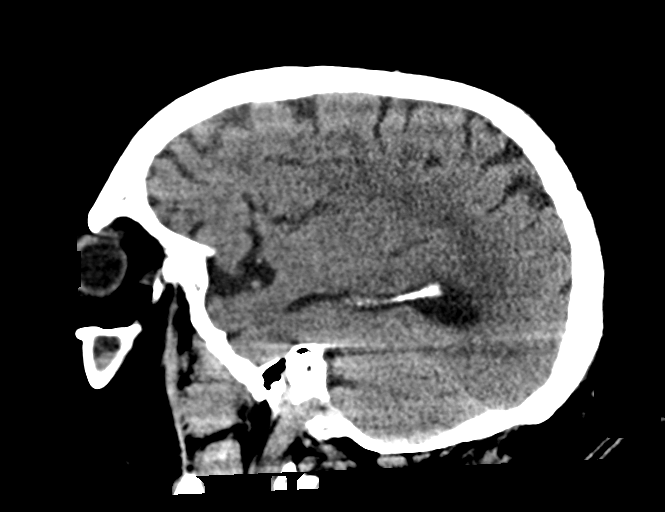

[16 of 47 positions shown; findings below may reference images not displayed]

FINDINGS: Brain: No acute infarct or hemorrhage. Lateral ventricles and
midline structures are unremarkable. No acute extra-axial fluid
collections. No mass effect.

Vascular: Stable atherosclerosis.  No hyperdense vessel.

Skull: Normal. Negative for fracture or focal lesion.

Sinuses/Orbits: Stable opacification of the left maxillary sinus.
Remaining paranasal sinuses are clear.

Other: None.
IMPRESSION: 1. Stable head CT, no acute intracranial process.

## 2021-03-20 IMAGING — CT CT ABD-PELV W/ CM
2 of 5 series · 13 of 36 positions shown, 16 images · IV contrast (omnipaque)
Comparison: CT abdomen and pelvis [DATE]

CLINICAL DATA: Urinary retention, UTI, more confused today than
usual, altered mental status chest pain, history atrial
fibrillation, alcoholic cirrhosis, coronary artery disease, diabetes
mellitus, portal hypertension, former smoker

EXAM:
CT CHEST, ABDOMEN, AND PELVIS WITH CONTRAST
TECHNIQUE: Multidetector CT imaging of the chest, abdomen and pelvis was
performed following the standard protocol during bolus
administration of intravenous contrast. Sagittal and coronal MPR
images reconstructed from axial data set.
CONTRAST:  100mL OMNIPAQUE IOHEXOL 350 MG/ML SOLN IV. No oral
contrast.

[Series 2: cap with · axial · 0.98mm/px · z∈[-1101,-561]mm · 10 of 132 slices shown, 13 images]
[im 12/132  mediastinal]
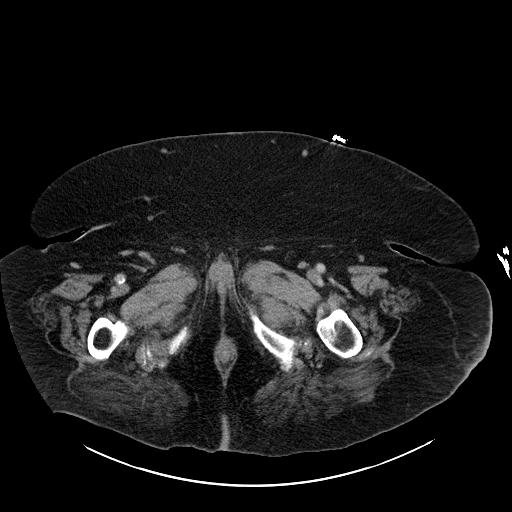
[im 12/132  lung]
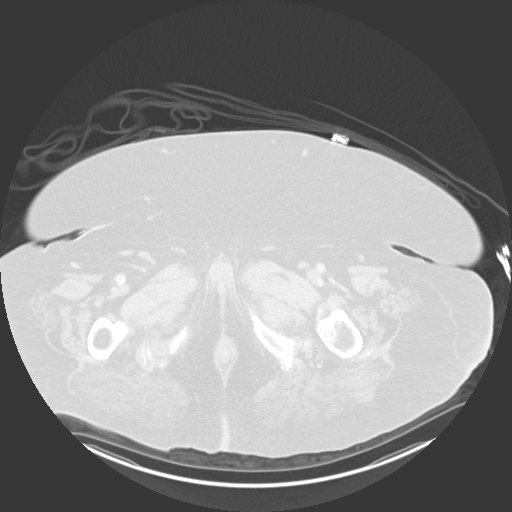
[im 24/132  lung]
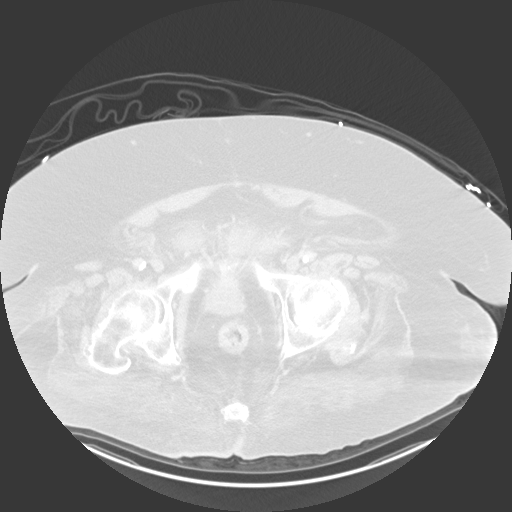
[im 36/132  lung]
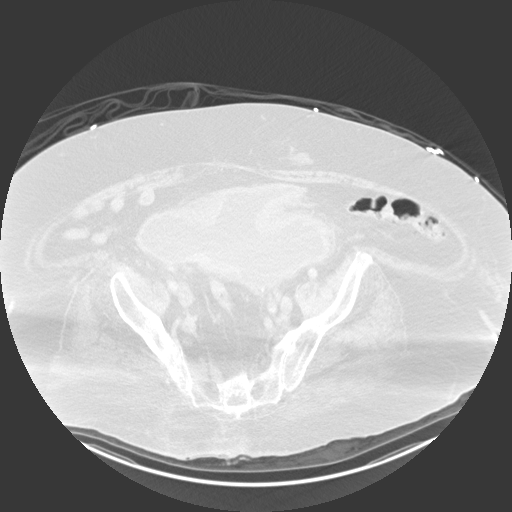
[im 48/132  lung]
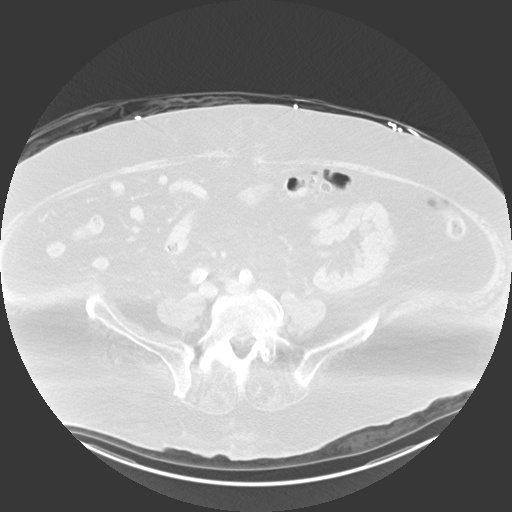
[im 60/132  mediastinal]
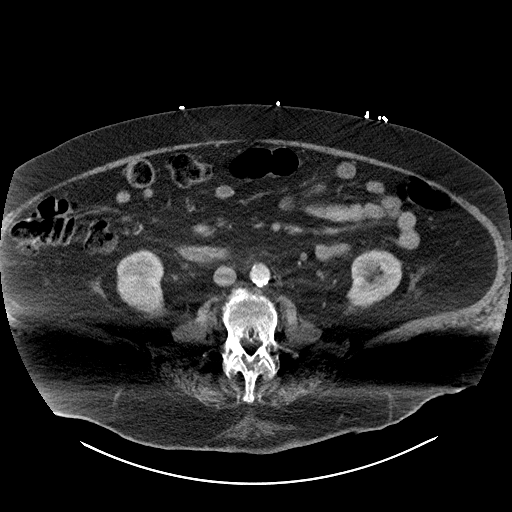
[im 60/132  lung]
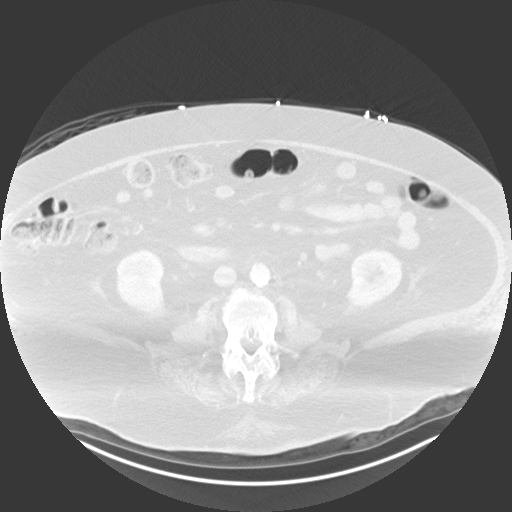
[im 72/132  lung]
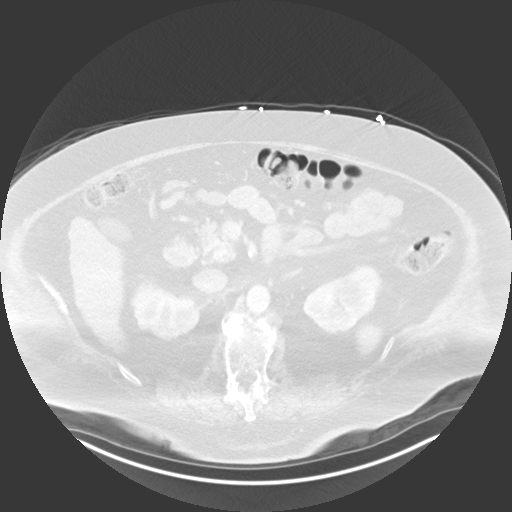
[im 84/132  lung]
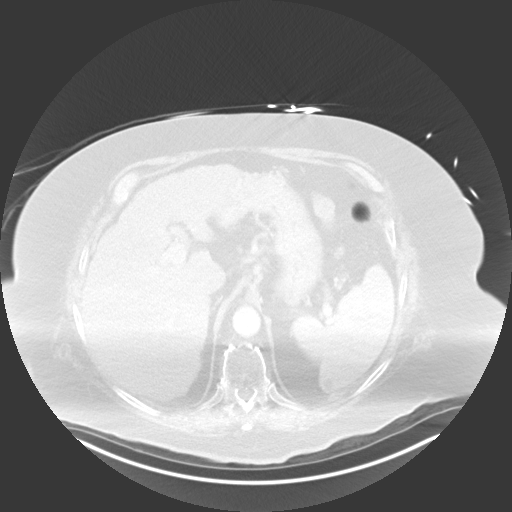
[im 96/132  lung]
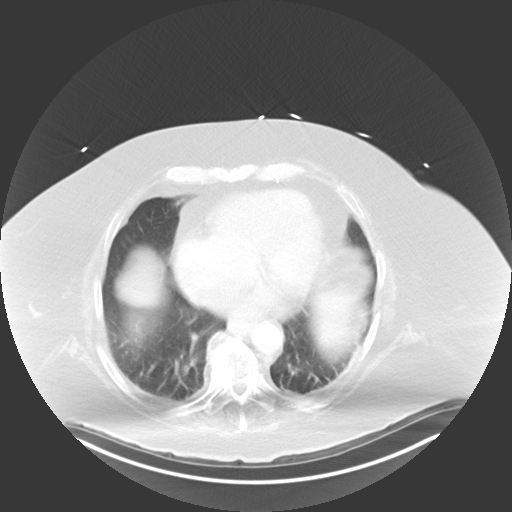
[im 108/132  mediastinal]
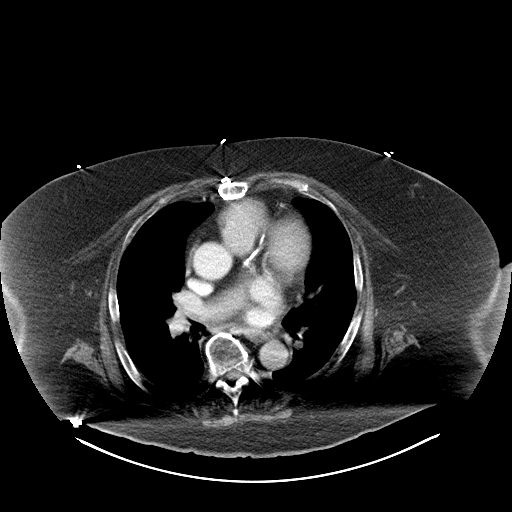
[im 108/132  lung]
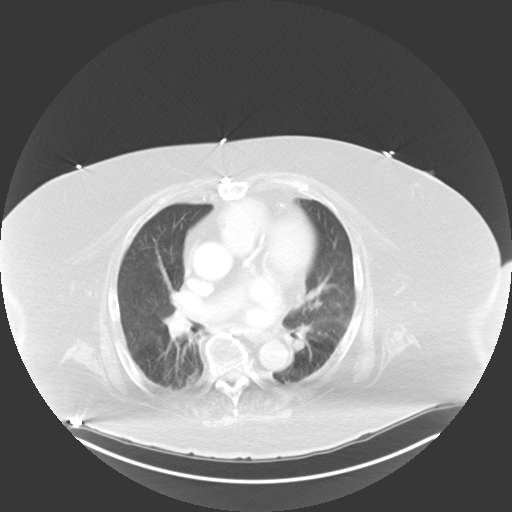
[im 120/132  lung]
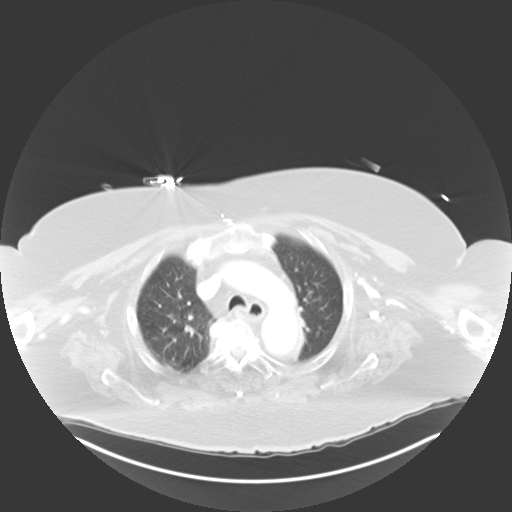

[Series 3: coronals · coronal · 1.16mm/px · 3 of 176 slices shown]
[im 36/176  lung]
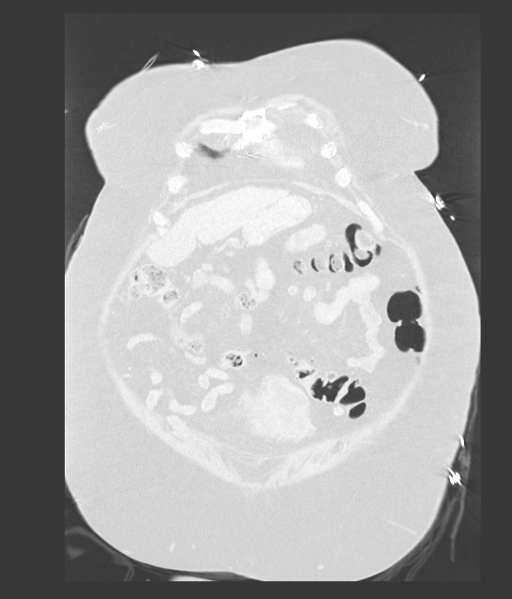
[im 71/176  lung]
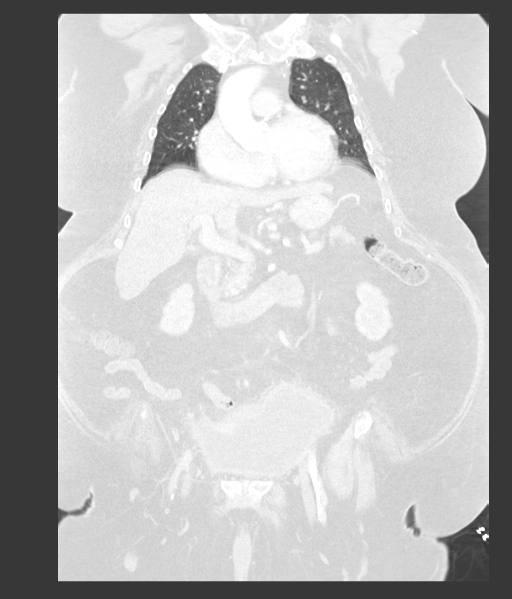
[im 106/176  lung]
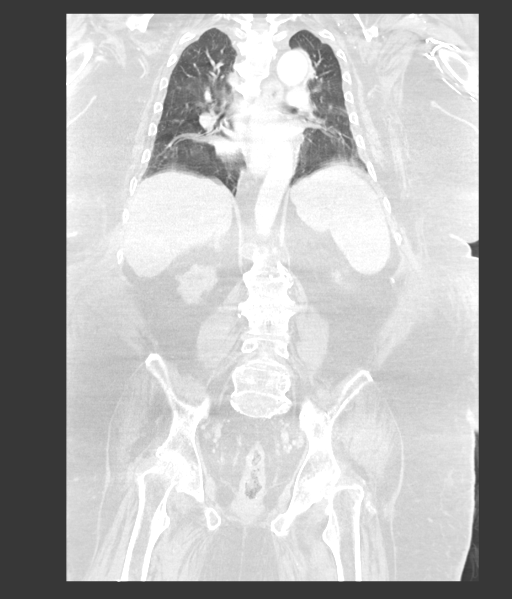

[13 of 36 positions shown; findings below may reference images not displayed]

FINDINGS: CT CHEST FINDINGS

Cardiovascular: Atherosclerotic calcifications aorta, coronary
arteries and proximal great vessels. Aorta normal caliber. Heart
unremarkable. No pericardial effusion. Lower lobe pulmonary arterial
assessment limited by beam hardening artifacts and respiratory
motion. A questionable filling defect in a RIGHT lower lobe
pulmonary artery axial image 31 appears to be an artifact on
sagittal images.

Mediastinum/Nodes: Esophagus unremarkable. Base of cervical region
normal appearance. No thoracic adenopathy.

Lungs/Pleura: Dependent bibasilar atelectasis and respiratory motion
artifacts. No definite pulmonary infiltrate, pleural effusion or
pneumothorax.

Musculoskeletal: Prior median sternotomy. Bones demineralized. No
acute osseous findings.

CT ABDOMEN PELVIS FINDINGS

Hepatobiliary: Gallbladder contracted with dependent calculi lower
gallbladder segment. Cirrhotic appearing nodular liver without
discrete mass.

Pancreas: Atrophic pancreas without mass

Spleen: Normal appearance

Adrenals/Urinary Tract: Adrenal thickening without mass. Cyst at
upper pole LEFT kidney 4.3 cm greatest diameter. Exophytic cyst
upper to mid RIGHT kidney 1.8 cm diameter. No additional renal mass,
hydronephrosis or ureteral dilatation. No urinary tract
calcifications. Bladder is partially distended and demonstrates a
thickened irregular wall with minimal adjacent infiltrative changes,
pattern suggesting cystitis and probably a degree of wall
trabeculation. Small bladder diverticula noted. Difficult to exclude
mass in this setting.

Stomach/Bowel: Sigmoid diverticulosis. Stomach and bowel loops
otherwise unremarkable.

Vascular/Lymphatic: Atherosclerotic calcifications aorta, visceral
arteries, iliac arteries. Aorta normal caliber. No adenopathy.

Reproductive: Unremarkable prostate gland and seminal vesicles

Other: Umbilical hernia and 2 supraumbilical ventral hernias are
identified, all containing fat. No free air or free fluid. Interval
decrease in size of a small subcutaneous fluid collection in the
LEFT anterior abdominal wall.

Musculoskeletal: Osseous demineralization. Degenerative disc and
facet disease changes lumbar spine.
IMPRESSION: Thickened irregular bladder wall with minimal adjacent infiltrative
changes, pattern suggesting cystitis and probably a degree of wall
trabeculation.

Small bladder diverticula noted.

Difficult to exclude mass in this setting, recommend correlation
with cystoscopy.

Cirrhotic appearing liver without discrete mass.

Cholelithiasis.

Umbilical and 2 supraumbilical ventral hernias containing fat.

Sigmoid diverticulosis.

Aortic Atherosclerosis ([RK]-[RK]).

## 2021-03-20 IMAGING — DX DG CHEST 1V PORT
1 series · 1 of 1 positions shown · non-contrast
Comparison: Chest radiograph [DATE]

CLINICAL DATA: Altered mental status.

EXAM:
PORTABLE CHEST 1 VIEW

[chest ap]
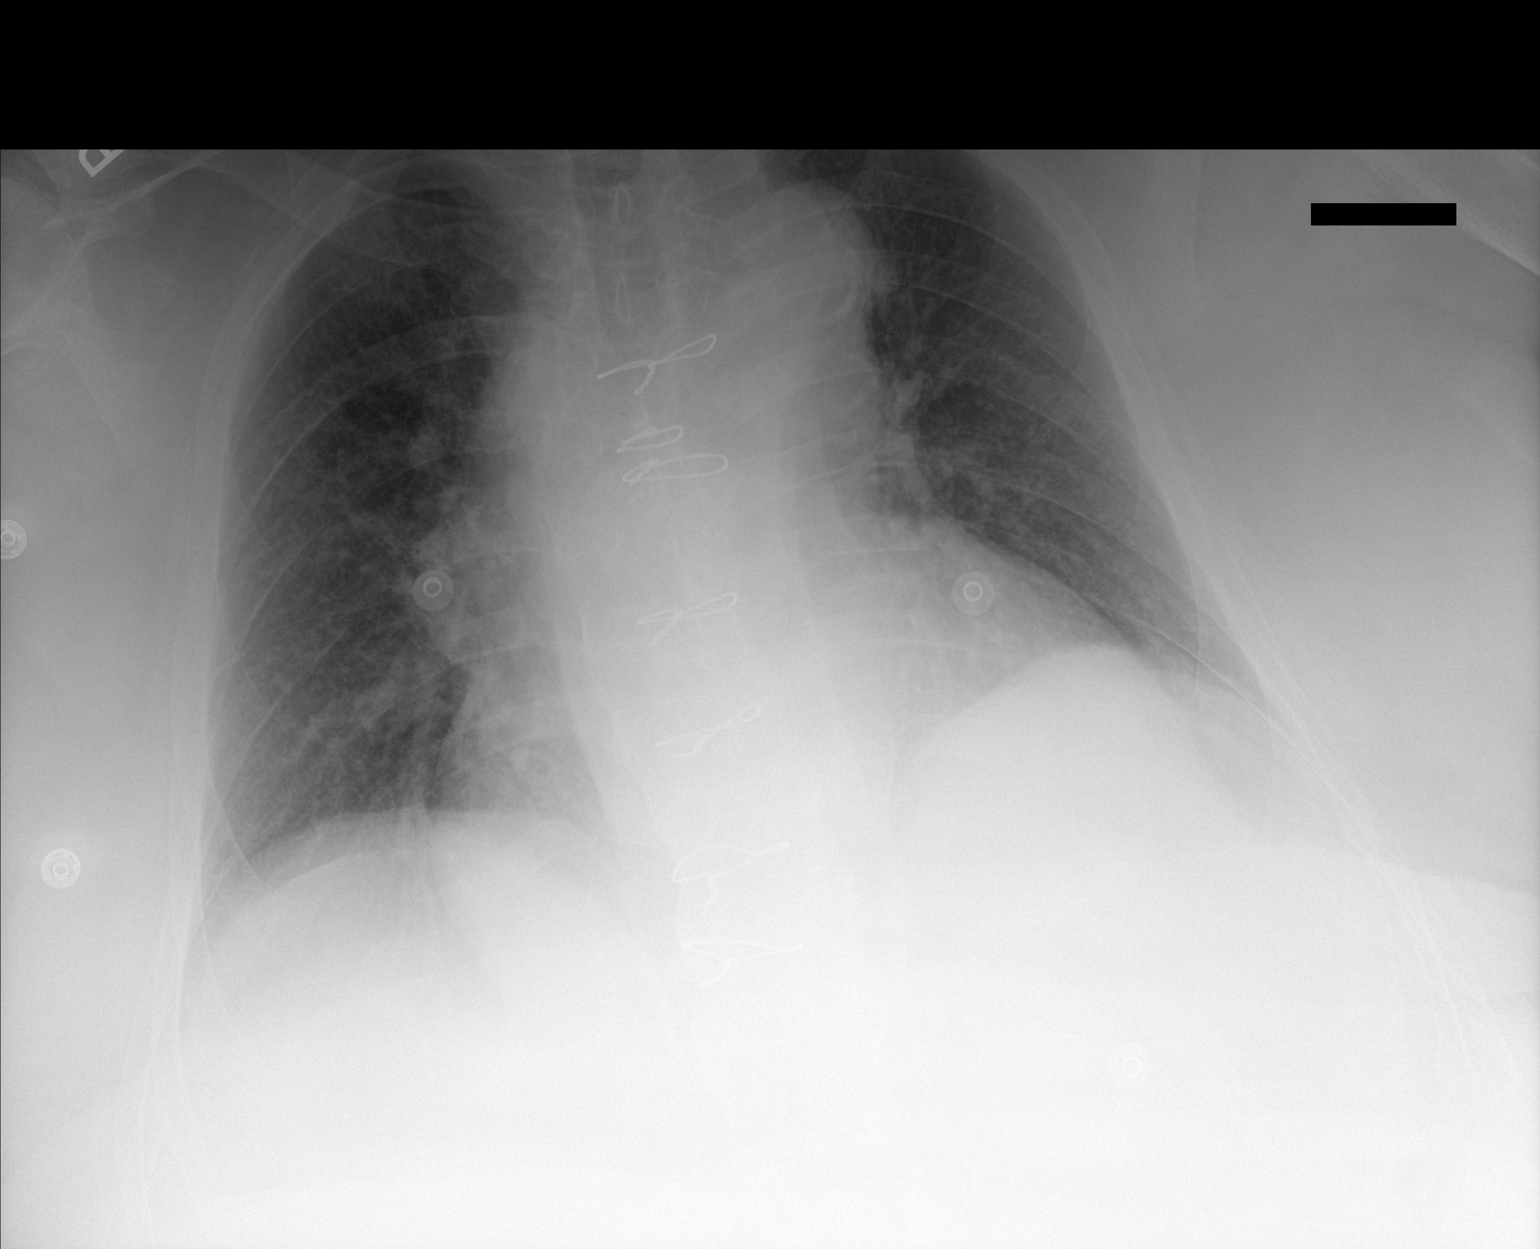

[1 of 1 positions shown; findings below may reference images not displayed]

FINDINGS: Diffuse mild hazy interstitial thickening. No focal consolidation,
pleural effusion, or pneumothorax. Stable cardiomediastinal
silhouette with cardiomegaly. Aortic calcifications. Sternal wires
are aligned and intact.
IMPRESSION: Diffuse mild interstitial opacities, suggestive of mild pulmonary
edema versus atypical/viral infection.

## 2021-03-20 MED ORDER — ENOXAPARIN SODIUM 30 MG/0.3ML IJ SOSY: 30.0000 mg | PREFILLED_SYRINGE | INTRAMUSCULAR | Status: DC

## 2021-03-20 MED ORDER — ACETAMINOPHEN 650 MG RE SUPP: 650.0000 mg | Freq: Four times a day (QID) | RECTAL | Status: DC | PRN

## 2021-03-20 MED ORDER — ONDANSETRON HCL 4 MG/2ML IJ SOLN: 4.0000 mg | Freq: Four times a day (QID) | INTRAMUSCULAR | Status: DC | PRN

## 2021-03-20 MED ORDER — INSULIN ASPART 100 UNIT/ML IJ SOLN: 0.0000 [IU] | Freq: Every day | INTRAMUSCULAR | Status: DC

## 2021-03-20 MED ORDER — IOHEXOL 350 MG/ML SOLN
100.0000 mL | Freq: Once | INTRAVENOUS | Status: AC | PRN
  Administered 2021-03-20: 100 mL via INTRAVENOUS

## 2021-03-20 MED ORDER — FUROSEMIDE 40 MG PO TABS
20.0000 mg | ORAL_TABLET | Freq: Two times a day (BID) | ORAL | Status: DC
  Administered 2021-03-21 – 2021-03-24 (×7): 20 mg via ORAL
  Filled 2021-03-20 (×7): qty 1

## 2021-03-20 MED ORDER — GABAPENTIN 300 MG PO CAPS
300.0000 mg | ORAL_CAPSULE | Freq: Three times a day (TID) | ORAL | Status: DC
  Administered 2021-03-20 – 2021-03-24 (×11): 300 mg via ORAL
  Filled 2021-03-20 (×11): qty 1

## 2021-03-20 MED ORDER — TAMSULOSIN HCL 0.4 MG PO CAPS
0.4000 mg | ORAL_CAPSULE | Freq: Every day | ORAL | Status: DC
  Administered 2021-03-20 – 2021-03-21 (×2): 0.4 mg via ORAL
  Filled 2021-03-20 (×2): qty 1

## 2021-03-20 MED ORDER — ENOXAPARIN SODIUM 40 MG/0.4ML IJ SOSY
70.0000 mg | PREFILLED_SYRINGE | Freq: Every day | INTRAMUSCULAR | Status: DC
  Administered 2021-03-20 – 2021-03-23 (×4): 70 mg via SUBCUTANEOUS
  Filled 2021-03-20 (×4): qty 0.8

## 2021-03-20 MED ORDER — FINASTERIDE 5 MG PO TABS
5.0000 mg | ORAL_TABLET | Freq: Every day | ORAL | Status: DC
  Administered 2021-03-21 – 2021-03-24 (×4): 5 mg via ORAL
  Filled 2021-03-20 (×4): qty 1

## 2021-03-20 MED ORDER — INSULIN ASPART 100 UNIT/ML IJ SOLN
0.0000 [IU] | Freq: Three times a day (TID) | INTRAMUSCULAR | Status: DC
  Administered 2021-03-21 – 2021-03-22 (×2): 3 [IU] via SUBCUTANEOUS
  Administered 2021-03-22 – 2021-03-23 (×4): 2 [IU] via SUBCUTANEOUS
  Administered 2021-03-23: 3 [IU] via SUBCUTANEOUS
  Administered 2021-03-24 (×2): 2 [IU] via SUBCUTANEOUS

## 2021-03-20 MED ORDER — PANTOPRAZOLE SODIUM 40 MG PO TBEC
40.0000 mg | DELAYED_RELEASE_TABLET | Freq: Every day | ORAL | Status: DC
  Administered 2021-03-21 – 2021-03-24 (×4): 40 mg via ORAL
  Filled 2021-03-20 (×4): qty 1

## 2021-03-20 MED ORDER — SODIUM CHLORIDE 0.9 % IV SOLN
2.0000 g | Freq: Two times a day (BID) | INTRAVENOUS | Status: DC
  Administered 2021-03-20 – 2021-03-22 (×4): 2 g via INTRAVENOUS
  Filled 2021-03-20 (×5): qty 2

## 2021-03-20 MED ORDER — CARVEDILOL 12.5 MG PO TABS
25.0000 mg | ORAL_TABLET | Freq: Two times a day (BID) | ORAL | Status: DC
  Administered 2021-03-21 – 2021-03-24 (×7): 25 mg via ORAL
  Filled 2021-03-20 (×7): qty 2

## 2021-03-20 MED ORDER — ACETAMINOPHEN 325 MG PO TABS
650.0000 mg | ORAL_TABLET | Freq: Four times a day (QID) | ORAL | Status: DC | PRN
  Administered 2021-03-23 – 2021-03-24 (×2): 650 mg via ORAL
  Filled 2021-03-20 (×2): qty 2

## 2021-03-20 MED ORDER — DIPHENHYDRAMINE HCL 25 MG PO CAPS: 50.0000 mg | ORAL_CAPSULE | Freq: Four times a day (QID) | ORAL | Status: DC | PRN

## 2021-03-20 MED ORDER — ONDANSETRON HCL 4 MG PO TABS: 4.0000 mg | ORAL_TABLET | Freq: Four times a day (QID) | ORAL | Status: DC | PRN

## 2021-03-20 MED ORDER — NYSTATIN 100000 UNIT/GM EX OINT
TOPICAL_OINTMENT | Freq: Two times a day (BID) | CUTANEOUS | Status: DC
  Administered 2021-03-23 (×2): 1 via TOPICAL
  Filled 2021-03-20: qty 15

## 2021-03-20 MED ORDER — PRAVASTATIN SODIUM 20 MG PO TABS
10.0000 mg | ORAL_TABLET | Freq: Every day | ORAL | Status: DC
  Administered 2021-03-20 – 2021-03-23 (×4): 10 mg via ORAL
  Filled 2021-03-20 (×4): qty 1

## 2021-03-20 MED ORDER — LACTULOSE 10 GM/15ML PO SOLN
20.0000 g | Freq: Every day | ORAL | Status: DC
  Administered 2021-03-21 – 2021-03-24 (×4): 20 g via ORAL
  Filled 2021-03-20 (×4): qty 30

## 2021-03-20 NOTE — H&P (Signed)
History and Physical        Hospital Admission Note Date: 03/20/2021  Patient name: Brandon [name] Medical record number: [id number] Date of birth: July 30, 1945 Age: 75 y.o. Gender: male  PCP: Helane Rima, MD    Patient coming from: [place], [place]  I have reviewed all records in the [facility].    Chief Complaint:  Confused  HPI: Patient is a 75 year old male with a history of A. fib, liver cirrhosis, colon CA, CAD, diabetes, obesity, BPH, right knee surgery 4 weeks ago on therapeutic Lovenox, recent admission 8/19-8/23, was just discharged yesterday.  Patient was treated for UTI, acute kidney injury, elevated ammonia and was discharged on oral ciprofloxacin.  Patient was sent back from the facility, staff reported "patient was more confused than usual". In ED, on examination patient is alert and oriented to self and place, but not able to give a good history.  He denies any fevers or chills, states he has diffuse " aches and pains everywhere", no coughing.  For other questions he states " I don't know".  Patient was noted to be retaining urine in ED and Foley catheter was placed with removal of 1.8 L of milky chocolate colored urine/pyuria.  CT abdomen showed cystitis, difficult to exclude mass in the setting recommended cystoscopy, liver cirrhosis without discrete mass, cholelithiasis.  ED work-up/course:  Temp 97.4, RR 14, hr 52, O2 sats 93%.  BP 125/75 Sodium 140, creatinine 1.0, BUN 18, CBC within normal limits  Review of Systems: Positives marked in 'bold' Difficult to obtain from the patient due to his mental status  Past Medical History: Past Medical History:  Diagnosis Date   Anemia 07/2019   Hb 6.7 >> 3 PRBCs >> 8.8 in 07/2019.      Atrial fibrillation ([place])    Cholelithiasis    Cirrhosis of liver ([place])    Presumed due to  EtOH as well as fatty liver from morbid obesity.  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  Not performed in [place] N/A 09/17/2019   Procedure: COLONOSCOPY WITH PROPOFOL;  Surgeon: [name], MD;  Location: [place];  Service: Endoscopy;  Laterality: N/A;   CORONARY ARTERY BYPASS GRAFT     in his 13s   ESOPHAGOGASTRODUODENOSCOPY (EGD) WITH PROPOFOL N/A 08/14/2019   Procedure: ESOPHAGOGASTRODUODENOSCOPY (EGD) WITH PROPOFOL;  Surgeon: [name], MD;  Location:  [place] ENDOSCOPY;  Service: Gastroenterology;  Laterality: N/A;   ESOPHAGOGASTRODUODENOSCOPY (EGD) WITH PROPOFOL N/A 09/17/2019   Procedure: ESOPHAGOGASTRODUODENOSCOPY (EGD) WITH PROPOFOL;  Surgeon: [name], MD;  Location: [place];  Service: Endoscopy;  Laterality: N/A;   ESOPHAGOGASTRODUODENOSCOPY (EGD) WITH PROPOFOL N/A 10/04/2019   Procedure: ESOPHAGOGASTRODUODENOSCOPY (EGD) WITH PROPOFOL;  Surgeon: Mauri Pole, MD;  Location: [facility] ENDOSCOPY;  Service: Endoscopy;  Laterality: N/A;   GIVENS CAPSULE STUDY N/A 09/17/2019   Procedure: GIVENS CAPSULE STUDY;  Surgeon: [name], MD;  Location: [place];  Service: Endoscopy;  Laterality: N/A;   HOT HEMOSTASIS N/A 08/14/2019   Procedure: HOT HEMOSTASIS  (ARGON PLASMA COAGULATION/BICAP);  Surgeon: [name], MD;  [facility] END[facility];  Service: Gastroenterology;  Laterality: N/A;   HOT HEMOSTASIS N/A 09/17/2019   Procedure: HOT HEMOSTASIS (ARGON PLASMA COAGULATION/BICAP);  Surgeon: [name], MD;  Location: [place];  Service: Endoscopy;  Laterality: N/A;   ORIF FEMUR FRACTURE Right 02/04/2021   Procedure: OPEN REDUCTION INTERNAL FIXATION (ORIF) DISTAL FEMUR FRACTURE;  Surgeon: Shona Needles, MD;  Location: [place];  Service: Orthopedics;  Laterality: Right;   PARTIAL COLECTOMY  2003   To address colon cancer   PATELLAR TENDON REPAIR Right 09/30/2018   Procedure: PATELLA TENDON REPAIR;  Surgeon: Altamese [place], MD;  Location: [place];  Service: Orthopedics;  Laterality: Right;   POLYPECTOMY  09/17/2019   Procedure: POLYPECTOMY;  Surgeon: [name], MD;  Location: [facility] ENDOSCOPY;  Service: Endoscopy;;    Medications: Prior to Admission medications   Medication Sig Start Date End Date Taking? Authorizing Provider  acetaminophen (TYLENOL) 500 MG tablet Take 1,000-1,500 mg by mouth every 6 (six) hours as needed for moderate pain.   Yes [provider]  carvedilol (COREG) 25 MG tablet Take 25 mg by mouth 2 (two) times daily with a meal.   Yes [provider]  ciprofloxacin (CIPRO) 500 MG tablet Take 1 tablet (500 mg total) by mouth 2 (two) times daily for 4 days. 03/19/21 03/23/21 Yes Bonnielee Haff, MD  enoxaparin (LOVENOX) 30 MG/0.3ML injection Inject 30 mg into the skin daily. This is taken in addition to the '40mg'$  dose, for a total of '70mg'$  per day   Yes [provider]  enoxaparin (LOVENOX) 40 MG/0.4ML injection Inject 40 mg into the skin daily. This is taken in addition to the '30mg'$  dose, for a total of '70mg'$  per day   Yes [provider]  finasteride (PROSCAR) 5 MG tablet Take 1 tablet (5 mg total) by mouth daily. 06/05/20  Yes Dagar, Meredith Staggers, MD  furosemide (LASIX) 20 MG tablet Take 20 mg by mouth 2  (two) times daily. 12/26/20  Yes [provider]  gabapentin (NEURONTIN) 300 MG capsule Take 1 capsule (300 mg total) by mouth 3 (three) times daily. 03/19/21  Yes Bonnielee Haff, MD  HYDROcodone-acetaminophen (NORCO/VICODIN) 5-325 MG tablet Take 1-2 tablets by mouth every 4 (four) hours as needed for moderate pain (pain score 4-6). 03/19/21  Yes Bonnielee Haff, MD  hydrOXYzine (ATARAX/VISTARIL) 25 MG tablet Take 1 tablet (25 mg total) by mouth daily. 03/19/21  Yes Bonnielee Haff, MD  lactulose (CHRONULAC) 10 GM/15ML solution Take 30 mLs (20 g total) by mouth daily. 03/20/21  Yes Bonnielee Haff, MD  metFORMIN (GLUCOPHAGE-XR) 500 MG 24 hr tablet Take 500 mg by mouth daily with breakfast.  02/18/18  Yes [provider]  ondansetron (ZOFRAN) 4 MG tablet Take 1 tablet (4 mg total) by mouth every 6 (six) hours as needed for  nausea. 02/06/21  Yes Delray Alt, PA-C  pantoprazole (PROTONIX) 40 MG tablet Take 1 tablet (40 mg total) by mouth daily. 11/08/19 03/20/21 Yes Esterwood, Amy S, PA-C  pravastatin (PRAVACHOL) 10 MG tablet Take 10 mg by mouth at bedtime. 02/18/18  Yes [provider]  spironolactone (ALDACTONE) 50 MG tablet Take 50 mg by mouth daily.   Yes [provider]  tamsulosin (FLOMAX) 0.4 MG CAPS capsule Take 0.4 mg by mouth at bedtime. 02/18/18  Yes [provider]    Allergies:  No Known Allergies  Social History:  reports that he quit smoking about 32 years ago. His smoking use included cigarettes. He has never used smokeless tobacco. He reports that he does not currently use alcohol. He reports that he does not use drugs.  Family History: Family History  Problem Relation Age of Onset   Colon cancer Father    CAD Other    Colon cancer Paternal Uncle    Colon cancer Paternal Uncle    Diabetes Neg Hx     Physical Exam: Blood pressure (!) 122/102, pulse 92, temperature (!) 97.4 F (36.3 C), temperature source Oral, resp. rate 20, SpO2 98  %. General: Alert, awake, oriented x self and place, knows he is in a hospital in [place], unable to provide good history Eyes: pink conjunctiva,anicteric sclera, pupils equal and reactive to light and accomodation, HEENT: normocephalic, atraumatic, oropharynx clear Neck: supple, no masses or lymphadenopathy, no goiter, no bruits, no JVD CVS: Regular rate and rhythm, No lower extremity edema Resp : Clear to auscultation bilaterally, no wheezing, rales or rhonchi. GI : Obese, soft, nontender, nondistended, positive bowel sounds. Musculoskeletal: No clubbing or cyanosis, positive pedal pulses. No contracture. ROM intact  Neuro: no focal FND's Psych: alert and oriented x 3, normal mood and affect Skin: Rash in the bilateral groin area, midline chest scar from prior surgery GU: Foley placed with pyuria as below      LABS on Admission: I have personally reviewed all the labs and imagings below    Basic Metabolic Panel: Recent Labs  Lab 03/16/21 0300 03/17/21 0256 03/19/21 0159 03/20/21 1305  NA 136   < > 133* 140  K 4.2   < > 4.6 4.2  CL 99   < > 101 106  CO2 25   < > 25 26  GLUCOSE 137*   < > 106* 103*  BUN 45*   < > 19 18  CREATININE 1.42*   < > 1.08 1.01  CALCIUM 9.9   < > 9.2 9.9  MG 1.7  --   --   --   PHOS 3.0  --   --   --    < > = values in this interval not displayed.   Liver Function Tests: Recent Labs  Lab 03/16/21 0300 03/20/21 1305  AST 15 12*  ALT 8 9  ALKPHOS 103 94  BILITOT 0.7 0.5  PROT 6.4* 6.6  ALBUMIN 2.6* 2.9*   No results for input(s): LIPASE, AMYLASE in the last 168 hours. Recent Labs  Lab 03/17/21 0256 03/20/21 1305  AMMONIA 63* 29   CBC: Recent Labs  Lab 03/15/21 1521 03/15/21 1620 03/18/21 0304 03/20/21 1305  WBC 10.3   < > 6.6 6.8  NEUTROABS 7.8*  --   --   --   HGB 13.4   < > 11.4* 12.6*  HCT 45.0   < > 37.7* 42.0  MCV 94.3   < > 92.4 94.8  PLT 231   < > 185 169   < > = values in this interval not displayed.   Cardiac  Enzymes: No results for input(s): CKTOTAL, CKMB, CKMBINDEX, TROPONINI in the last 168 hours. BNP: Invalid input(s): POCBNP CBG: Recent Labs  Lab 03/19/21 1132 03/20/21 1311  GLUCAP 131* 106*    Radiological Exams on Admission:  CT HEAD WO CONTRAST (5MM)  Result Date: 03/20/2021 CLINICAL DATA:  Delirium, history of urinary tract infection, chest pain EXAM: CT HEAD WITHOUT CONTRAST TECHNIQUE: Contiguous axial images were obtained from the base of the skull through the vertex without intravenous contrast. COMPARISON:  02/03/2021 FINDINGS: Brain: No acute infarct or hemorrhage. Lateral ventricles and midline structures are unremarkable. No acute extra-axial fluid collections. No mass effect. Vascular: Stable atherosclerosis.  No hyperdense vessel. Skull: Normal. Negative for fracture or focal lesion. Sinuses/Orbits: Stable opacification of the left maxillary sinus. Remaining paranasal sinuses are clear. Other: None. IMPRESSION: 1. Stable head CT, no acute intracranial process. Electronically Signed   By: Randa Ngo M.D.   On: 03/20/2021 16:17   CT Chest W Contrast  Result Date: 03/20/2021 CLINICAL DATA:  Urinary retention, UTI, more confused today than usual, altered mental status chest pain, history atrial fibrillation, alcoholic cirrhosis, coronary artery disease, diabetes mellitus, portal hypertension, former smoker EXAM: CT CHEST, ABDOMEN, AND PELVIS WITH CONTRAST TECHNIQUE: Multidetector CT imaging of the chest, abdomen and pelvis was performed following the standard protocol during bolus administration of intravenous contrast. Sagittal and coronal MPR images reconstructed from axial data set. CONTRAST:  133m OMNIPAQUE IOHEXOL 350 MG/ML SOLN IV. No oral contrast. COMPARISON:  CT abdomen and pelvis 08/14/2019 FINDINGS: CT CHEST FINDINGS Cardiovascular: Atherosclerotic calcifications aorta, coronary arteries and proximal great vessels. Aorta normal caliber. Heart unremarkable. No pericardial  effusion. Lower lobe pulmonary arterial assessment limited by beam hardening artifacts and respiratory motion. A questionable filling defect in a RIGHT lower lobe pulmonary artery axial image 31 appears to be an artifact on sagittal images. Mediastinum/Nodes: Esophagus unremarkable. Base of cervical region normal appearance. No thoracic adenopathy. Lungs/Pleura: Dependent bibasilar atelectasis and respiratory motion artifacts. No definite pulmonary infiltrate, pleural effusion or pneumothorax. Musculoskeletal: Prior median sternotomy. Bones demineralized. No acute osseous findings. CT ABDOMEN PELVIS FINDINGS Hepatobiliary: Gallbladder contracted with dependent calculi lower gallbladder segment. Cirrhotic appearing nodular liver without discrete mass. Pancreas: Atrophic pancreas without mass Spleen: Normal appearance Adrenals/Urinary Tract: Adrenal thickening without mass. Cyst at upper pole LEFT kidney 4.3 cm greatest diameter. Exophytic cyst upper to mid RIGHT kidney 1.8 cm diameter. No additional renal mass, hydronephrosis or ureteral dilatation. No urinary tract calcifications. Bladder is partially distended and demonstrates a thickened irregular wall with minimal adjacent infiltrative changes, pattern suggesting cystitis and probably a degree of wall trabeculation. Small bladder diverticula noted. Difficult to exclude mass in this setting. Stomach/Bowel: Sigmoid diverticulosis. Stomach and bowel loops otherwise unremarkable. Vascular/Lymphatic: Atherosclerotic calcifications aorta, visceral arteries, iliac arteries. Aorta normal caliber. No adenopathy. Reproductive: Unremarkable prostate gland and seminal vesicles Other: Umbilical hernia and 2 supraumbilical ventral hernias are identified, all containing fat. No free air or free fluid. Interval decrease in size of a small subcutaneous fluid collection in the LEFT anterior abdominal wall. Musculoskeletal: Osseous demineralization. Degenerative disc and facet  disease changes lumbar spine. IMPRESSION: Thickened irregular bladder wall with minimal adjacent infiltrative changes, pattern suggesting cystitis and probably a degree of wall trabeculation. Small bladder diverticula noted. Difficult to exclude mass in this setting, recommend correlation with cystoscopy. Cirrhotic appearing liver without discrete mass. Cholelithiasis. Umbilical and  2 supraumbilical ventral hernias containing fat. Sigmoid diverticulosis. Aortic Atherosclerosis (ICD10-I70.0). Electronically Signed   By: Lavonia Dana M.D.   On: 03/20/2021 16:28   CT ABDOMEN PELVIS W CONTRAST  Result Date: 03/20/2021 CLINICAL DATA:  Urinary retention, UTI, more confused today than usual, altered mental status chest pain, history atrial fibrillation, alcoholic cirrhosis, coronary artery disease, diabetes mellitus, portal hypertension, former smoker EXAM: CT CHEST, ABDOMEN, AND PELVIS WITH CONTRAST TECHNIQUE: Multidetector CT imaging of the chest, abdomen and pelvis was performed following the standard protocol during bolus administration of intravenous contrast. Sagittal and coronal MPR images reconstructed from axial data set. CONTRAST:  145m OMNIPAQUE IOHEXOL 350 MG/ML SOLN IV. No oral contrast. COMPARISON:  CT abdomen and pelvis 08/14/2019 FINDINGS: CT CHEST FINDINGS Cardiovascular: Atherosclerotic calcifications aorta, coronary arteries and proximal great vessels. Aorta normal caliber. Heart unremarkable. No pericardial effusion. Lower lobe pulmonary arterial assessment limited by beam hardening artifacts and respiratory motion. A questionable filling defect in a RIGHT lower lobe pulmonary artery axial image 31 appears to be an artifact on sagittal images. Mediastinum/Nodes: Esophagus unremarkable. Base of cervical region normal appearance. No thoracic adenopathy. Lungs/Pleura: Dependent bibasilar atelectasis and respiratory motion artifacts. No definite pulmonary infiltrate, pleural effusion or pneumothorax.  Musculoskeletal: Prior median sternotomy. Bones demineralized. No acute osseous findings. CT ABDOMEN PELVIS FINDINGS Hepatobiliary: Gallbladder contracted with dependent calculi lower gallbladder segment. Cirrhotic appearing nodular liver without discrete mass. Pancreas: Atrophic pancreas without mass Spleen: Normal appearance Adrenals/Urinary Tract: Adrenal thickening without mass. Cyst at upper pole LEFT kidney 4.3 cm greatest diameter. Exophytic cyst upper to mid RIGHT kidney 1.8 cm diameter. No additional renal mass, hydronephrosis or ureteral dilatation. No urinary tract calcifications. Bladder is partially distended and demonstrates a thickened irregular wall with minimal adjacent infiltrative changes, pattern suggesting cystitis and probably a degree of wall trabeculation. Small bladder diverticula noted. Difficult to exclude mass in this setting. Stomach/Bowel: Sigmoid diverticulosis. Stomach and bowel loops otherwise unremarkable. Vascular/Lymphatic: Atherosclerotic calcifications aorta, visceral arteries, iliac arteries. Aorta normal caliber. No adenopathy. Reproductive: Unremarkable prostate gland and seminal vesicles Other: Umbilical hernia and 2 supraumbilical ventral hernias are identified, all containing fat. No free air or free fluid. Interval decrease in size of a small subcutaneous fluid collection in the LEFT anterior abdominal wall. Musculoskeletal: Osseous demineralization. Degenerative disc and facet disease changes lumbar spine. IMPRESSION: Thickened irregular bladder wall with minimal adjacent infiltrative changes, pattern suggesting cystitis and probably a degree of wall trabeculation. Small bladder diverticula noted. Difficult to exclude mass in this setting, recommend correlation with cystoscopy. Cirrhotic appearing liver without discrete mass. Cholelithiasis. Umbilical and 2 supraumbilical ventral hernias containing fat. Sigmoid diverticulosis. Aortic Atherosclerosis (ICD10-I70.0).  Electronically Signed   By: MLavonia DanaM.D.   On: 03/20/2021 16:28   DG Chest Portable 1 View  Result Date: 03/20/2021 CLINICAL DATA:  Altered mental status. EXAM: PORTABLE CHEST 1 VIEW COMPARISON:  Chest radiograph 03/15/2021 FINDINGS: Diffuse mild hazy interstitial thickening. No focal consolidation, pleural effusion, or pneumothorax. Stable cardiomediastinal silhouette with cardiomegaly. Aortic calcifications. Sternal wires are aligned and intact. IMPRESSION: Diffuse mild interstitial opacities, suggestive of mild pulmonary edema versus atypical/viral infection. Electronically Signed   By: LIleana RoupM.D.   On: 03/20/2021 13:35      EKG: Independently reviewed.  Rate 68, atrial fibrillation, minimal ST depressions in anterolateral leads, QTC 463   Assessment/Plan Principal Problem:   Acute metabolic encephalopathy -Likely due to UTI, pyuria.  Ammonia level 29.  Labs unremarkable, no severe electrolyte abnormalities. -CT head showed no acute  intracranial process.  If mental status does not improve despite antibiotics and further work-up, will obtain MRI of the brain   Active Problems: Urinary tract infection with acute urinary retention -Patient was found to have urinary retention, Foley catheter was placed with 1.8 L urine output.  Continue Foley catheter. -Urine noted to be pus/milky chocolate looking in appearance.  Urine culture sent.  Will check procalcitonin -Started on IV cefepime.  Prior urine culture had shown Enterobacter, patient was discharged on oral ciprofloxacin -will consult urology, continue Flomax, finasteride     DM (diabetes mellitus), type 2 ([place]), NIDDM -Hold metformin -Placed on sliding scale insulin while inpatient, recent hemoglobin A1c 6.3   Chronic atrial fibrillation (HCC) -Currently rate controlled, resume Coreg -Continue therapeutic Lovenox -Patient was not on long-term anticoagulation due to prior history of GI bleed and liver cirrhosis however  he has been on DVT prophylaxis due to right femoral fracture in 01/2021    Chronic heart failure with preserved ejection fraction (HFpEF) (HCC) -Currently euvolemic, will resume Lasix 20 mg twice daily    Cirrhosis of liver ([place]) -Confused but likely not from hepatic encephalopathy, ammonia level is normal, no asterixis -Continue lactulose  Recent distal right femoral fracture surgery in 01/2021 -PT OT evaluation, will continue Lovenox once ambulatory status is improved  Morbid obesity  Bilateral groin rash - will place on antifungal ointment  DVT prophylaxis: Therapeutic Lovenox  CODE STATUS: Full CODE STATUS  Consults called: urology  Family Communication: Admission, patients condition and plan of care including tests being ordered have been discussed with the patient who indicates understanding and agree with the plan and Code Status  Admission status:   The medical decision making on this patient was of high complexity and the patient is at high risk for clinical deterioration, therefore this is a level 3 admission.  Severity of Illness:      The appropriate patient status for this patient is INPATIENT. Inpatient status is judged to be reasonable and necessary in order to provide the required intensity of service to ensure the patient's safety. The patient's presenting symptoms, physical exam findings, and initial radiographic and laboratory data in the context of their chronic comorbidities is felt to place them at high risk for further clinical deterioration. Furthermore, it is not anticipated that the patient will be medically stable for discharge from the hospital within 2 midnights of admission. The following factors support the patient status of inpatient.   " The patient's presenting symptoms include altered mental status, UTI, pyuria " The worrisome physical exam findings include confused, pyuria " The initial radiographic and laboratory data are worrisome because of  UTI, " The chronic co-morbidities include recurrent UTI, BPH, atrial fibrillation, liver cirrhosis   * I certify that at the point of admission it is my clinical judgment that the patient will require inpatient hospital care spanning beyond 2 midnights from the point of admission due to high intensity of service, high risk for further deterioration and high frequency of surveillance required.*   Time Spent on Admission: 70 minutes     Liana Camerer M.D. [facility] 03/20/2021, 6:00 PM

## 2021-03-20 NOTE — ED Provider Notes (Signed)
[facility] DEPT Provider Note   CSN: [id number] Arrival date & time: 03/20/21  1216     History Chief Complaint  Patient presents with   Altered Mental Status    Brandon [name] is a 75 y.o. male.  HPI  Patient is a 75 year old male with a history of atrial fibrillation not anticoagulated, cirrhosis of the liver, CAD, diabetes mellitus, obesity, who presents to the emergency department due to altered mental status.  Patient recently admitted to the hospital on August 19 and discharged yesterday, August 23.  Patient discussed with nursing staff at [facility] and they state that he is typically alert and oriented and able to sit upright and express his needs.  While checking on him this morning they noticed that patient had a condom cath in place and had urine in his catheter bag that was brown/red with sediment.  They state patient appeared fatigued and confused and his oxygen saturations were 94% on room air, so EMS was called.  Patient appears fatigued and falls asleep intermittently throughout my exam.  States the year is "2003" and is unable to tell me who the president is.  Currently A&O x1.  Per records, patient has a history of cirrhosis and was recently admitted for UTI, AKI, as well as liver cirrhosis.  AKI resolved with IV fluids.  UTI initially treated with Rocephin and urine culture came back + for Enterobacter and patient was started on p.o. ciprofloxacin which he was discharged on as well.  Echocardiogram on May 26, 2020 shows an LVEF of 60 to 65%.     Past Medical History:  Diagnosis Date   Anemia 07/2019   Hb 6.7 >> 3 PRBCs >> 8.8 in 07/2019.      Atrial fibrillation ([place])    Cholelithiasis    Cirrhosis of liver ([place])    Presumed due to EtOH as well as fatty liver from morbid obesity.  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/2019   Obesity 07/2019   Portal hypertension ([place])    Tubular adenoma of colon     Patient Active Problem List   Diagnosis Date Noted   Pressure injury of skin 03/16/2021   UTI (urinary tract infection) 03/16/2021   Generalized weakness 03/15/2021   Disp supracondyl fx w/o intracondylar extension of right distal femur ([place]) 02/05/2021   Hypomagnesemia 02/04/2021   Fall at home, initial encounter 02/04/2021   Cirrhosis of liver (HCC)    Chronic heart failure with preserved ejection fraction (HFpEF) ([place]) 06/07/2020   Hyperkalemia 06/07/2020   Weakness generalized 06/07/2020   Physical deconditioning 06/06/2020   Hypothermia 05/31/2020   Bullous pemphigoid [id number]   Alcoholic cirrhosis of liver with ascites ([place])    Portal hypertensive gastropathy ([place])    Melena 10/02/2019   Angiodysplasia of stomach with hemorrhage    Gastrointestinal hemorrhage    Atrial fibrillation (HCC)    Chronic pain syndrome    Symptomatic anemia 08/13/2019   Acute upper GI bleed 08/13/2019   Anemia 07/2019   Avulsion of right patellar tendon    Closed displaced fracture of phalanx of toe of right foot 10/06/2018   Obesity BMI  over 52 09/30/2018   Patella fracture, extensor mechanism disruption R knee  09/29/2018   History of colon cancer 08/23/2018   CAD (coronary artery disease) 08/23/2018   DM (diabetes mellitus), type 2 ([place]) 08/23/2018   Pyuria 08/23/2018    Past Surgical History:  Procedure Laterality Date   APPLICATION OF WOUND VAC Right 09/30/2018   Procedure: Application Of Wound Vac;  Surgeon: Altamese [place], MD;  Location: [facility];  Service: Orthopedics;  Laterality: Right;   COLONOSCOPY  2010   Removed 2 polyps of unclear type.  Not performed in [place]  N/A 09/17/2019   Procedure: COLONOSCOPY WITH PROPOFOL;  Surgeon: [name], MD;  Location: [facility];  Service: Endoscopy;  Laterality: N/A;   CORONARY ARTERY BYPASS GRAFT     in his 47s   ESOPHAGOGASTRODUODENOSCOPY (EGD) WITH PROPOFOL N/A 08/14/2019   Procedure: ESOPHAGOGASTRODUODENOSCOPY (EGD) WITH PROPOFOL;  Surgeon: [name], MD;  Location: [facility];  Service: Gastroenterology;  Laterality: N/A;   ESOPHAGOGASTRODUODENOSCOPY (EGD) WITH PROPOFOL N/A 09/17/2019   Procedure: ESOPHAGOGASTRODUODENOSCOPY (EGD) WITH PROPOFOL;  Surgeon: [name], MD;  Location: [facility];  Service: Endoscopy;  Laterality: N/A;   ESOPHAGOGASTRODUODENOSCOPY (EGD) WITH PROPOFOL N/A 10/04/2019   Procedure: ESOPHAGOGASTRODUODENOSCOPY (EGD) WITH PROPOFOL;  Surgeon: Mauri Pole, MD;  Location: [facility] ENDOSCOPY;  Service: Endoscopy;  Laterality: N/A;   GIVENS CAPSULE STUDY N/A 09/17/2019   Procedure: GIVENS CAPSULE STUDY;  Surgeon: [name], MD;  Location: [place] [facility];  Service: Endoscopy;  Laterality: N/A;   HOT HEMOSTASIS N/A 08/14/2019   Procedure: HOT HEMOSTASIS (ARGON PLASMA COAGULATION/BICAP);  Surgeon: [name], MD;  Location: [facility] ENDOSCOPY;  Service: Gastroenterology;  Laterality: N/A;   HOT HEMOSTASIS N/A 09/17/2019   Procedure: HOT HEMOSTASIS (ARGON PLASMA COAGULATION/BICAP);  Surgeon: [name], MD;  Location: [facility];  Service: Endoscopy;  Laterality: N/A;   ORIF FEMUR FRACTURE Right 02/04/2021   Procedure: OPEN REDUCTION INTERNAL FIXATION (ORIF) DISTAL FEMUR FRACTURE;  Surgeon: Shona Needles, MD;  Location: [facility];  Service: Orthopedics;  Laterality: Right;   PARTIAL COLECTOMY  2003   To address colon cancer   PATELLAR TENDON REPAIR Right 09/30/2018   Procedure: PATELLA TENDON REPAIR;  Surgeon: Altamese [place], MD;  Location: [facility];  Service: Orthopedics;  Laterality: Right;   POLYPECTOMY  09/17/2019   Procedure: POLYPECTOMY;  Surgeon: [name], MD;  Location: [facility]  ENDOSCOPY;  Service: Endoscopy;;      Family History  Problem Relation Age of Onset   Colon cancer Father    CAD Other    Colon cancer Paternal Uncle    Colon cancer Paternal Uncle    Diabetes Neg Hx     Social History   Tobacco Use   Smoking status: Former    Types: Cigarettes    Quit date: 1990    Years since quitting: 32.6   Smokeless tobacco: Never  Vaping Use   Vaping Use: Never used  Substance Use Topics   Alcohol use: Not Currently    Comment: 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 Authorizing Provider  acetaminophen (TYLENOL) 500 MG tablet Take 1,000-1,500 mg by mouth every 6 (six) hours as needed for moderate pain.   Yes [provider]  carvedilol (COREG) 25 MG tablet Take 25 mg by mouth 2 (two) times daily with a meal.   Yes  [provider]  ciprofloxacin (CIPRO) 500 MG tablet Take 1 tablet (500 mg total) by mouth 2 (two) times daily for 4 days. 03/19/21 03/23/21 Yes Bonnielee Haff, MD  enoxaparin (LOVENOX) 30 MG/0.3ML injection Inject 30 mg into the skin daily. This is taken in addition to the '40mg'$  dose, for a total of '70mg'$  per day   Yes [provider]  enoxaparin (LOVENOX) 40 MG/0.4ML injection Inject 40 mg into the skin daily. This is taken in addition to the '30mg'$  dose, for a total of '70mg'$  per day   Yes [provider]  finasteride (PROSCAR) 5 MG tablet Take 1 tablet (5 mg total) by mouth daily. 06/05/20  Yes Dagar, Meredith Staggers, MD  furosemide (LASIX) 20 MG tablet Take 20 mg by mouth 2 (two) times daily. 12/26/20  Yes [provider]  gabapentin (NEURONTIN) 300 MG capsule Take 1 capsule (300 mg total) by mouth 3 (three) times daily. 03/19/21  Yes Bonnielee Haff, MD  HYDROcodone-acetaminophen (NORCO/VICODIN) 5-325 MG tablet Take 1-2 tablets by mouth every 4 (four) hours as needed for moderate pain (pain score 4-6).  03/19/21  Yes Bonnielee Haff, MD  hydrOXYzine (ATARAX/VISTARIL) 25 MG tablet Take 1 tablet (25 mg total) by mouth daily. 03/19/21  Yes Bonnielee Haff, MD  lactulose (CHRONULAC) 10 GM/15ML solution Take 30 mLs (20 g total) by mouth daily. 03/20/21  Yes Bonnielee Haff, MD  metFORMIN (GLUCOPHAGE-XR) 500 MG 24 hr tablet Take 500 mg by mouth daily with breakfast.  02/18/18  Yes [provider]  ondansetron (ZOFRAN) 4 MG tablet Take 1 tablet (4 mg total) by mouth every 6 (six) hours as needed for nausea. 02/06/21  Yes Delray Alt, PA-C  pantoprazole (PROTONIX) 40 MG tablet Take 1 tablet (40 mg total) by mouth daily. 11/08/19 03/20/21 Yes [place], Amy S, PA-C  pravastatin (PRAVACHOL) 10 MG tablet Take 10 mg by mouth at bedtime. 02/18/18  Yes [provider]  spironolactone (ALDACTONE) 50 MG tablet Take 50 mg by mouth daily.   Yes [provider]  tamsulosin (FLOMAX) 0.4 MG CAPS capsule Take 0.4 mg by mouth at bedtime. 02/18/18  Yes [provider]    Allergies    Patient has no known allergies.  Review of Systems   Review of Systems  All other systems reviewed and are negative. Ten systems reviewed and are negative for acute change, except as noted in the HPI.   Physical Exam Updated Vital Signs BP 125/78   Pulse 63   Temp (!) 97.4 F (36.3 C) (Oral)   Resp 18   SpO2 97%   Physical Exam Vitals and nursing note reviewed.  Constitutional:      General: He is not in acute distress.    Appearance: He is obese. He is not ill-appearing, toxic-appearing or diaphoretic.  HENT:     Head: Normocephalic and atraumatic.     Right Ear: External ear normal.     Left Ear: External ear normal.     Nose: Nose normal.     Mouth/Throat:     Mouth: Mucous membranes are moist.     Pharynx: Oropharynx is clear. No oropharyngeal exudate or posterior oropharyngeal erythema.  Eyes:     General: No scleral icterus.       Right eye: No discharge.        Left eye: No  discharge.     Extraocular Movements: Extraocular movements intact.     Conjunctiva/sclera: Conjunctivae normal.  Cardiovascular:     Rate and  Rhythm: Normal rate. Rhythm irregular.     Pulses: Normal pulses.     Heart sounds: Normal heart sounds. No murmur heard.   No friction rub. No gallop.  Pulmonary:     Effort: Pulmonary effort is normal. No respiratory distress.     Breath sounds: Normal breath sounds. No stridor. No wheezing, rhonchi or rales.  Abdominal:     General: Abdomen is flat.     Palpations: Abdomen is soft.     Tenderness: There is no abdominal tenderness.     Comments: Protuberant abdomen that is soft.  Does not appear to exhibit tenderness with deep palpation.  Musculoskeletal:        General: Normal range of motion.     Cervical back: Normal range of motion and neck supple. No tenderness.  Skin:    General: Skin is warm and dry.  Neurological:     Mental Status: He is alert.     Comments: A&O x1.  Appears extremely fatigued.  Intermittently falling asleep throughout my exam.    ED Results / Procedures / Treatments   Labs (all labs ordered are listed, but only abnormal results are displayed) Labs Reviewed  COMPREHENSIVE METABOLIC PANEL - Abnormal; Notable for the following components:      Result Value   Glucose, Bld 103 (*)    Albumin 2.9 (*)    AST 12 (*)    All other components within normal limits  CBC - Abnormal; Notable for the following components:   Hemoglobin 12.6 (*)    All other components within normal limits  CBG MONITORING, ED - Abnormal; Notable for the following components:   Glucose-Capillary 106 (*)    All other components within normal limits  RESP PANEL BY RT-PCR (FLU A&B, COVID) [id number]  URINE CULTURE  PROTIME-INR  AMMONIA  URINALYSIS, ROUTINE W REFLEX MICROSCOPIC  BRAIN NATRIURETIC PEPTIDE    EKG EKG Interpretation  Date/Time:  Wednesday March 20 2021 13:13:27 EDT Ventricular Rate:  68 PR Interval:    QRS  Duration: 111 QT Interval:  435 QTC Calculation: 463 R Axis:   -32 Text Interpretation: Atrial fibrillation Ventricular premature complex Left axis deviation Minimal ST depression, anterolateral leads Confirmed by Regan Lemming ([id number]) on 03/20/2021 1:17:11 PM  Radiology DG Chest Portable 1 View  Result Date: 03/20/2021 CLINICAL DATA:  Altered mental status. EXAM: PORTABLE CHEST 1 VIEW COMPARISON:  Chest radiograph 03/15/2021 FINDINGS: Diffuse mild hazy interstitial thickening. No focal consolidation, pleural effusion, or pneumothorax. Stable cardiomediastinal silhouette with cardiomegaly. Aortic calcifications. Sternal wires are aligned and intact. IMPRESSION: Diffuse mild interstitial opacities, suggestive of mild pulmonary edema versus atypical/viral infection. Electronically Signed   By: Ileana Roup M.D.   On: 03/20/2021 13:35    Procedures Procedures   Medications Ordered in ED Medications - No data to display  ED Course  I have reviewed the triage vital signs and the nursing notes.  Pertinent labs & imaging results that were available during my care of the patient were reviewed by me and considered in my medical decision making (see chart for details).  Clinical Course as of 03/20/21 1507  Wed Mar 21, 4735  [id number] 75 year old male just discharged from the hospital for UTI elevated ammonia AKI.  He was returned to his facility yesterday and today they felt he was more altered and had some darker sediment in his catheter bag.  Patient is sleepy here although arouses to voice and denies any complaints.  He is getting lab work urinalysis chest  x-ray and head CT.  Disposition per results of testing. [MB]  [id number] Patient not providing a urine sample so I had the nursing staff perform an in and out.  Patient appears to be retaining urine and was found to have 1800 cc of urine. Will place a foley catheter. [LJ]    Clinical Course User Index [LJ] Rayna Sexton, PA-C [MB] Hayden Rasmussen, MD    MDM Rules/Calculators/A&P                          Pt is a 75 y.o. male who presents to the emergency department from [place] due to altered mental status.  Discharged from the hospital yesterday and transition to ciprofloxacin for UTI secondary to Enterobacter.  Found this morning A&O x1 with saturations around 94% on room air.  Labs: CBC with a hemoglobin of 12.6. CMP with a glucose of 103, albumin of 2.9, AST of 12. PT/INR within normal limits. Respiratory panel is negative. Ammonia within normal limits at 29. CBG 106. BNP is pending. UA and urine culture pending.  Imaging: Chest x-ray shows diffuse mild interstitial opacities suggestive of mild pulmonary edema versus atypical/viral infection. CT scan of the head without contrast is pending. CT scan of the chest, abdomen, and pelvis with contrast is pending.  I, Rayna Sexton, PA-C, personally reviewed and evaluated these images and lab results as part of my medical decision-making.  Patient's lab work that has resulted is mostly reassuring.  Chest x-ray did show some mild interstitial opacities which could be suggestive of mild pulmonary edema versus atypical/viral infection.  Respiratory panel is negative.  BNP is pending.  CT scan of the chest is pending.  Patient not providing a urine sample so I had the nursing staff obtain an in and out on the patient.  He was found to be retaining urine and voided about 1800 cc of urine.  We will have the nursing staff place a Foley catheter.  UA and urine culture are now pending.  We will also obtain a CT scan of the abdomen/pelvis.  It is the end of my shift and patient care is being transferred to [facility] NP-C.  Disposition pending based on results of BNP, UA, as well as to remaining CT imaging.  Note: Portions of this report may have been transcribed using voice recognition software. Every effort was made to ensure accuracy; however, inadvertent computerized transcription errors  may be present.   Final Clinical Impression(s) / ED Diagnoses Final diagnoses:  Altered mental status, unspecified altered mental status type   Rx / DC Orders ED Discharge Orders          Ordered    CT CHEST ABDOMEN PELVIS W CONTRAST  Status:  Canceled        03/20/21 1456             Rayna Sexton, PA-C 03/20/21 1522    Hayden Rasmussen, MD 03/20/21 [phone]

## 2021-03-20 NOTE — Progress Notes (Signed)
Pharmacy Antibiotic Note  Brandon [name] is a 75 y.o. male recently hospitalized from 8/19-8/23. During this admission he was treated for enterobacter UTI. He was discharged on 8/23 with ciprofloxacin with plan to treat through 8/27. He presented back to the ED on 03/20/2021 with AMS.  Pharmacy has been consulted to dose cefepime or UTI.  Plan: - cefepime 2gm q12h  _____________________________________ Temp (24hrs), Avg:97.4 F (36.3 C), Min:97.4 F (36.3 C), Max:97.4 F (36.3 C)  Recent Labs  Lab 03/15/21 1521 03/15/21 1620 03/15/21 1721 03/16/21 0300 03/17/21 0256 03/18/21 0304 03/19/21 0159 03/20/21 1305  WBC 10.3  --   --  7.9 6.6 6.6  --  6.8  CREATININE 1.53*   < >  --  1.42* 1.24 1.10 1.08 1.01  LATICACIDVEN 2.2*  --  2.0*  --   --   --   --   --    < > = values in this interval not displayed.    Estimated Creatinine Clearance: 83.8 mL/min (by C-G formula based on SCr of 1.01 mg/dL).    No Known Allergies  8/24 cefepime>>  8/19 ucx: >100K enterobacter cloacae (R= ancef) FINAL 8/24 ucx:   Thank you for allowing pharmacy to be a part of this patient's care.  Lynelle Doctor 03/20/2021 5:58 PM

## 2021-03-20 NOTE — Consult Note (Signed)
Urology Consult   Physician requesting consult: Dr. Tana Coast  Reason for consult: Urinary retention and UTI  History of Present Illness: Brandon [name] is a 75 y.o. patient with aPMH significant for atrial fibrillation, cirrhosis of liver, colon cancer, coronary artery disease, diabetes, hypertension, history of GI bleed, obesity, BPH,  s/p right knee surgery recently who was hospitalized recently for a UTI with culture indicating enterobacter cloacae.  He was discharged yesterday on ciprofloxacin back to rehab but was sent back to the ED today with worsening confusion.  A Foley catheter was placed with 1.8 L of purulent urine returned.   He does have BPH and is managed with chronic medical therapy including finasteride and tamsulosin.  He does not currently see a urologist.  He denies prior episodes of urinary retention.  He normally lives independently with his mother who is in her mid 27s.  Past Medical History:  Diagnosis Date   Anemia 07/2019   Hb 6.7 >> 3 PRBCs >> 8.8 in 07/2019.      Atrial fibrillation ([place])    Cholelithiasis    Cirrhosis of liver ([place])    Presumed due to EtOH as well as fatty liver from morbid obesity.  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  Not performed in [place] N/A  09/17/2019   Procedure: COLONOSCOPY WITH PROPOFOL;  Surgeon: [name], MD;  Location: [place];  Service: Endoscopy;  Laterality: N/A;   CORONARY ARTERY BYPASS GRAFT     in his 49s   ESOPHAGOGASTRODUODENOSCOPY (EGD) WITH PROPOFOL N/A 08/14/2019   Procedure: ESOPHAGOGASTRODUODENOSCOPY (EGD) WITH PROPOFOL;  Surgeon: [name], MD;  Location: [place];  Service: Gastroenterology;  Laterality: N/A;   ESOPHAGOGASTRODUODENOSCOPY (EGD) WITH PROPOFOL N/A 09/17/2019   Procedure: ESOPHAGOGASTRODUODENOSCOPY (EGD) WITH PROPOFOL;  Surgeon: [name], MD;  Location: [place];  Service: Endoscopy;  Laterality: N/A;   ESOPHAGOGASTRODUODENOSCOPY (EGD) WITH PROPOFOL N/A 10/04/2019   Procedure: ESOPHAGOGASTRODUODENOSCOPY (EGD) WITH PROPOFOL;  Surgeon: Mauri Pole, MD;  Location: [facility] ENDOSCOPY;  Service: Endoscopy;  Laterality: N/A;   GIVENS CAPSULE STUDY N/A 09/17/2019   Procedure: GIVENS CAPSULE STUDY;  Surgeon: [name], MD;  Location: [place];  Service: Endoscopy;  Laterality: N/A;   HOT HEMOSTASIS N/A 08/14/2019   Procedure: HOT HEMOSTASIS (ARGON PLASMA COAGULATION/BICAP);  Surgeon: [name], MD;  Location: [facility]COPY;  Service: Gastroenterology;  Laterality: N/A;   HOT HEMOSTASIS N/A 09/17/2019   Procedure: HOT HEMOSTASIS (ARGON PLASMA COAGULATION/BICAP);  Surgeon: [name], MD;  Location: [place];  Service: Endoscopy;  Laterality: N/A;   ORIF FEMUR FRACTURE Right 02/04/2021   Procedure: OPEN REDUCTION INTERNAL FIXATION (ORIF) DISTAL FEMUR FRACTURE;  Surgeon: Shona Needles, MD;  Location: [place];  Service: Orthopedics;  Laterality: Right;   PARTIAL COLECTOMY  2003   To address colon cancer   PATELLAR TENDON REPAIR Right 09/30/2018   Procedure: PATELLA TENDON REPAIR;  Surgeon: Altamese [place], MD;  Location: [place];  Service: Orthopedics;  Laterality: Right;   POLYPECTOMY  09/17/2019   Procedure: POLYPECTOMY;  Surgeon: [name], MD;  Location: [facility] ENDOSCOPY;   Service: Endoscopy;;    Medications:  Home meds:  No current facility-administered medications on file prior to encounter.   Current Outpatient Medications on File Prior to Encounter  Medication Sig Dispense Refill   acetaminophen (TYLENOL) 500 MG tablet Take 1,000-1,500 mg by mouth every 6 (six) hours as needed for moderate pain.     carvedilol (COREG) 25 MG tablet Take 25 mg by mouth 2 (two) times daily with a meal.     ciprofloxacin (CIPRO) 500 MG tablet Take 1 tablet (500 mg total) by mouth 2 (two) times daily for 4 days. 8 tablet 0   enoxaparin (LOVENOX) 30 MG/0.3ML injection Inject 30 mg into the skin daily. This is taken in addition to the '40mg'$  dose, for a total of '70mg'$  per day     enoxaparin (LOVENOX) 40 MG/0.4ML injection Inject 40 mg into the skin daily. This is taken in addition to the '30mg'$  dose, for a total of '70mg'$  per day     finasteride (PROSCAR) 5 MG tablet Take 1 tablet (5 mg total) by mouth daily. 30 tablet 0   furosemide (LASIX) 20 MG tablet Take 20 mg by mouth 2 (two) times daily.     gabapentin (NEURONTIN) 300 MG capsule Take 1 capsule (300 mg total) by mouth 3 (three) times daily.     HYDROcodone-acetaminophen (NORCO/VICODIN) 5-325 MG tablet Take 1-2 tablets by mouth every 4 (four) hours as needed for moderate pain (pain score 4-6). 20 tablet 0   hydrOXYzine (ATARAX/VISTARIL) 25 MG tablet Take 1 tablet (25 mg total) by mouth daily. 30 tablet 0   lactulose (CHRONULAC) 10 GM/15ML solution Take 30 mLs (20 g total) by mouth daily. 236 mL 0   metFORMIN (GLUCOPHAGE-XR) 500 MG 24 hr tablet Take 500 mg by mouth daily with breakfast.      ondansetron (ZOFRAN) 4 MG tablet Take 1 tablet (4 mg total) by mouth every 6 (six) hours as needed for nausea. 20 tablet 0   pantoprazole (PROTONIX) 40 MG tablet Take 1 tablet (40 mg total) by mouth daily. 30 tablet 11   pravastatin (PRAVACHOL) 10 MG tablet Take 10 mg by mouth at bedtime.     spironolactone (ALDACTONE) 50 MG tablet Take 50 mg by  mouth daily.     tamsulosin (FLOMAX) 0.4 MG CAPS capsule Take 0.4 mg by mouth at bedtime.       Scheduled Meds: Continuous Infusions:  ceFEPime (MAXIPIME) IV     PRN Meds:.  Allergies: No Known Allergies  Family History  Problem Relation Age of Onset   Colon cancer Father    CAD Other    Colon cancer Paternal Uncle    Colon cancer Paternal Uncle    Diabetes Neg Hx     Social History:  reports that he quit smoking about 32 years ago. His smoking use included cigarettes. He has never used smokeless tobacco. He reports that he does not currently use alcohol. He reports that he does not use drugs.  ROS: A complete review of systems was performed.  All systems are negative except for pertinent findings as noted.  Physical Exam:  Vital signs in last  24 hours: Temp:  [97.4 F (36.3 C)] 97.4 F (36.3 C) (08/24 1226) Pulse Rate:  [61-92] 92 (08/24 1700) Resp:  [15-20] 20 (08/24 1700) BP: (114-149)/(75-102) 122/102 (08/24 1700) SpO2:  [96 %-98 %] 98 % (08/24 1700) Constitutional:  Alert and oriented, No acute distress Cardiovascular: No JVD Respiratory: Normal respiratory effort, Lungs clear bilaterally GI: Abdomen is soft, nontender, nondistended, no abdominal masses Genitourinary: No CVAT. Normal male phallus, testes are descended bilaterally and non-tender and without masses, scrotum is normal in appearance without lesions or masses, perineum is normal on inspection.  Catheter in place and draining well with clear urine. Rectal: Normal sphincter tone, prostate is enlarged and mildly firm throughout. Lymphatic: No lymphadenopathy Neurologic: Grossly intact, no focal deficits Psychiatric: Normal mood and affect  Laboratory Data:  Recent Labs    03/18/21 0304 03/20/21 1305  WBC 6.6 6.8  HGB 11.4* 12.6*  HCT 37.7* 42.0  PLT 185 169    Recent Labs    03/18/21 0304 03/19/21 0159 03/20/21 1305  NA 135 133* 140  K 3.7 4.6 4.2  CL 103 101 106  GLUCOSE 100* 106* 103*   BUN '23 19 18  '$ CALCIUM 9.3 9.2 9.9  CREATININE 1.10 1.08 1.01     Results for orders placed or performed during the hospital encounter of 03/20/21 (from the past 24 hour(s))  Urinalysis, Routine w reflex microscopic Urine, Clean Catch     Status: Abnormal   Collection Time: 03/20/21 12:55 PM  Result Value Ref Range   Color, Urine AMBER (A) YELLOW   APPearance TURBID (A) CLEAR   Specific Gravity, Urine 1.013 1.005 - 1.030   pH 7.0 5.0 - 8.0   Glucose, UA NEGATIVE NEGATIVE mg/dL   Hgb urine dipstick MODERATE (A) NEGATIVE   Bilirubin Urine NEGATIVE NEGATIVE   Ketones, ur NEGATIVE NEGATIVE mg/dL   Protein, ur 100 (A) NEGATIVE mg/dL   Nitrite NEGATIVE NEGATIVE   Leukocytes,Ua LARGE (A) NEGATIVE   RBC / HPF 21-50 0 - 5 RBC/hpf   WBC, UA >50 (H) 0 - 5 WBC/hpf   Bacteria, UA RARE (A) NONE SEEN   Squamous Epithelial / LPF 0-5 0 - 5  Comprehensive metabolic panel     Status: Abnormal   Collection Time: 03/20/21  1:05 PM  Result Value Ref Range   Sodium 140 135 - 145 mmol/L   Potassium 4.2 3.5 - 5.1 mmol/L   Chloride 106 98 - 111 mmol/L   CO2 26 22 - 32 mmol/L   Glucose, Bld 103 (H) 70 - 99 mg/dL   BUN 18 8 - 23 mg/dL   Creatinine, Ser 1.01 0.61 - 1.24 mg/dL   Calcium 9.9 8.9 - 10.3 mg/dL   Total Protein 6.6 6.5 - 8.1 g/dL   Albumin 2.9 (L) 3.5 - 5.0 g/dL   AST 12 (L) 15 - 41 U/L   ALT 9 0 - 44 U/L   Alkaline Phosphatase 94 38 - 126 U/L   Total Bilirubin 0.5 0.3 - 1.2 mg/dL   GFR, Estimated >60 >60 mL/min   Anion gap 8 5 - 15  CBC     Status: Abnormal   Collection Time: 03/20/21  1:05 PM  Result Value Ref Range   WBC 6.8 4.0 - 10.5 K/uL   RBC 4.43 4.22 - 5.81 MIL/uL   Hemoglobin 12.6 (L) 13.0 - 17.0 g/dL   HCT 42.0 39.0 - 52.0 %   MCV 94.8 80.0 - 100.0 fL   MCH 28.4 26.0 - 34.0  pg   MCHC 30.0 30.0 - 36.0 g/dL   RDW 14.4 11.5 - 15.5 %   Platelets 169 150 - 400 K/uL   nRBC 0.0 0.0 - 0.2 %  Protime-INR - (order if patient is taking Coumadin / Warfarin)     Status: None    Collection Time: 03/20/21  1:05 PM  Result Value Ref Range   Prothrombin Time 14.0 11.4 - 15.2 seconds   INR 1.1 0.8 - 1.2  Ammonia     Status: None   Collection Time: 03/20/21  1:05 PM  Result Value Ref Range   Ammonia 29 9 - 35 umol/L  Resp Panel by RT-PCR (Flu A&B, Covid) Nasopharyngeal Swab     Status: None   Collection Time: 03/20/21  1:05 PM   Specimen: Nasopharyngeal Swab; Nasopharyngeal(NP) swabs in vial transport medium  Result Value Ref Range   SARS Coronavirus 2 by RT PCR NEGATIVE NEGATIVE   Influenza A by PCR NEGATIVE NEGATIVE   Influenza B by PCR NEGATIVE NEGATIVE  Brain natriuretic peptide     Status: Abnormal   Collection Time: 03/20/21  1:05 PM  Result Value Ref Range   B Natriuretic Peptide 168.3 (H) 0.0 - 100.0 pg/mL  CBG monitoring, ED     Status: Abnormal   Collection Time: 03/20/21  1:11 PM  Result Value Ref Range   Glucose-Capillary 106 (H) 70 - 99 mg/dL   Recent Results (from the past 240 hour(s))  Urine Culture     Status: Abnormal   Collection Time: 03/15/21  3:21 PM   Specimen: In/Out Cath Urine  Result Value Ref Range Status   Specimen Description IN/OUT CATH URINE  Final   Special Requests   Final    NONE Performed at [facility], [address], [place], [place] [address]    Culture >=100,000 COLONIES/mL ENTEROBACTER CLOACAE (A)  Final   Report Status 03/18/2021 FINAL  Final   Organism ID, Bacteria ENTEROBACTER CLOACAE (A)  Final      Susceptibility   Enterobacter cloacae - MIC*    CEFAZOLIN >=64 RESISTANT Resistant     CEFEPIME <=0.12 SENSITIVE Sensitive     CIPROFLOXACIN <=0.25 SENSITIVE Sensitive     GENTAMICIN <=1 SENSITIVE Sensitive     IMIPENEM 0.5 SENSITIVE Sensitive     NITROFURANTOIN 32 SENSITIVE Sensitive     TRIMETH/SULFA <=20 SENSITIVE Sensitive     PIP/TAZO <=4 SENSITIVE Sensitive     * >=100,000 COLONIES/mL ENTEROBACTER CLOACAE  Blood Culture (routine x 2)     Status: None   Collection Time: 03/15/21  3:35 PM    Specimen: BLOOD RIGHT HAND  Result Value Ref Range Status   Specimen Description BLOOD RIGHT HAND  Final   Special Requests   Final    BOTTLES DRAWN AEROBIC AND ANAEROBIC Blood Culture results may not be optimal due to an inadequate volume of blood received in culture bottles   Culture   Final    NO GROWTH 5 DAYS Performed at [facility], [place] [address], [place], [place] [address]    Report Status 03/20/2021 FINAL  Final  Blood Culture (routine x 2)     Status: None   Collection Time: 03/15/21  3:35 PM   Specimen: BLOOD LEFT HAND  Result Value Ref Range Status   Specimen Description BLOOD LEFT HAND  Final   Special Requests   Final    BOTTLES DRAWN AEROBIC AND ANAEROBIC Blood Culture results may not be optimal due to  an inadequate volume of blood received in culture bottles   Culture   Final    NO GROWTH 5 DAYS Performed at [facility], [place] [address], [place], [place] [address]    Report Status 03/20/2021 FINAL  Final  Resp Panel by RT-PCR (Flu A&B, Covid) Nasopharyngeal Swab     Status: None   Collection Time: 03/15/21  7:30 PM   Specimen: Nasopharyngeal Swab; Nasopharyngeal(NP) swabs in vial transport medium  Result Value Ref Range Status   SARS Coronavirus 2 by RT PCR NEGATIVE NEGATIVE Final    Comment: (NOTE) SARS-CoV-2 target nucleic acids are NOT DETECTED.  The SARS-CoV-2 RNA is generally detectable in upper respiratory specimens during the acute phase of infection. The lowest concentration of SARS-CoV-2 viral copies this assay can detect is 138 copies/mL. A negative result does not preclude SARS-Cov-2 infection and should not be used as the sole basis for treatment or other patient management decisions. A negative result may occur with  improper specimen collection/handling, submission of specimen other than nasopharyngeal swab, presence of viral mutation(s) within the areas targeted by this assay, and inadequate number of viral copies(<138 copies/mL). A  negative result must be combined with clinical observations, patient history, and epidemiological information. The expected result is Negative.  Fact Sheet for Patients:  EntrepreneurPulse.com.au  Fact Sheet for Healthcare Providers:  IncredibleEmployment.be  This test is no t yet approved or cleared by the [place] FDA and  has been authorized for detection and/or diagnosis of SARS-CoV-2 by FDA under an Emergency Use Authorization (EUA). This EUA will remain  in effect (meaning this test can be used) for the duration of the COVID-19 declaration under Section 564(b)(1) of the Act, 21 U.S.C.section 360bbb-3(b)(1), unless the authorization is terminated  or revoked sooner.       Influenza A by PCR NEGATIVE NEGATIVE Final   Influenza B by PCR NEGATIVE NEGATIVE Final    Comment: (NOTE) The Xpert Xpress SARS-CoV-2/FLU/RSV plus assay is intended as an aid in the diagnosis of influenza from Nasopharyngeal swab specimens and should not be used as a sole basis for treatment. Nasal washings and aspirates are unacceptable for Xpert Xpress SARS-CoV-2/FLU/RSV testing.  Fact Sheet for Patients: EntrepreneurPulse.com.au  Fact Sheet for Healthcare Providers: IncredibleEmployment.be  This test is not yet approved or cleared by the [place] FDA and has been authorized for detection and/or diagnosis of SARS-CoV-2 by FDA under an Emergency Use Authorization (EUA). This EUA will remain in effect (meaning this test can be used) for the duration of the COVID-19 declaration under Section 564(b)(1) of the Act, 21 U.S.C. section 360bbb-3(b)(1), unless the authorization is terminated or revoked.  Performed at [facility], [place] [address] [place], [place] [address]   Resp Panel by RT-PCR (Flu A&B, Covid) Nasopharyngeal Swab     Status: None   Collection Time: 03/18/21  4:14 PM   Specimen: Nasopharyngeal Swab;  Nasopharyngeal(NP) swabs in vial transport medium  Result Value Ref Range Status   SARS Coronavirus 2 by RT PCR NEGATIVE NEGATIVE Final    Comment: (NOTE) SARS-CoV-2 target nucleic acids are NOT DETECTED.  The SARS-CoV-2 RNA is generally detectable in upper respiratory specimens during the acute phase of infection. The lowest concentration of SARS-CoV-2 viral copies this assay can detect is 138 copies/mL. A negative result does not preclude SARS-Cov-2 infection and should not be used as the sole basis for treatment or other patient management decisions. A negative result may occur with  improper specimen collection/handling,  submission of specimen other than nasopharyngeal swab, presence of viral mutation(s) within the areas targeted by this assay, and inadequate number of viral copies(<138 copies/mL). A negative result must be combined with clinical observations, patient history, and epidemiological information. The expected result is Negative.  Fact Sheet for Patients:  EntrepreneurPulse.com.au  Fact Sheet for Healthcare Providers:  IncredibleEmployment.be  This test is no t yet approved or cleared by the [place] FDA and  has been authorized for detection and/or diagnosis of SARS-CoV-2 by FDA under an Emergency Use Authorization (EUA). This EUA will remain  in effect (meaning this test can be used) for the duration of the COVID-19 declaration under Section 564(b)(1) of the Act, 21 U.S.C.section 360bbb-3(b)(1), unless the authorization is terminated  or revoked sooner.       Influenza A by PCR NEGATIVE NEGATIVE Final   Influenza B by PCR NEGATIVE NEGATIVE Final    Comment: (NOTE) The Xpert Xpress SARS-CoV-2/FLU/RSV plus assay is intended as an aid in the diagnosis of influenza from Nasopharyngeal swab specimens and should not be used as a sole basis for treatment. Nasal washings and aspirates are unacceptable for Xpert Xpress  SARS-CoV-2/FLU/RSV testing.  Fact Sheet for Patients: EntrepreneurPulse.com.au  Fact Sheet for Healthcare Providers: IncredibleEmployment.be  This test is not yet approved or cleared by the [place] FDA and has been authorized for detection and/or diagnosis of SARS-CoV-2 by FDA under an Emergency Use Authorization (EUA). This EUA will remain in effect (meaning this test can be used) for the duration of the COVID-19 declaration under Section 564(b)(1) of the Act, 21 U.S.C. section 360bbb-3(b)(1), unless the authorization is terminated or revoked.  Performed at [facility], [place] [address], [place], [place] [address]   Resp Panel by RT-PCR (Flu A&B, Covid) Nasopharyngeal Swab     Status: None   Collection Time: 03/20/21  1:05 PM   Specimen: Nasopharyngeal Swab; Nasopharyngeal(NP) swabs in vial transport medium  Result Value Ref Range Status   SARS Coronavirus 2 by RT PCR NEGATIVE NEGATIVE Final    Comment: (NOTE) SARS-CoV-2 target nucleic acids are NOT DETECTED.  The SARS-CoV-2 RNA is generally detectable in upper respiratory specimens during the acute phase of infection. The lowest concentration of SARS-CoV-2 viral copies this assay can detect is 138 copies/mL. A negative result does not preclude SARS-Cov-2 infection and should not be used as the sole basis for treatment or other patient management decisions. A negative result may occur with  improper specimen collection/handling, submission of specimen other than nasopharyngeal swab, presence of viral mutation(s) within the areas targeted by this assay, and inadequate number of viral copies(<138 copies/mL). A negative result must be combined with clinical observations, patient history, and epidemiological information. The expected result is Negative.  Fact Sheet for Patients:  EntrepreneurPulse.com.au  Fact Sheet for Healthcare Providers:   IncredibleEmployment.be  This test is no t yet approved or cleared by the [place] FDA and  has been authorized for detection and/or diagnosis of SARS-CoV-2 by FDA under an Emergency Use Authorization (EUA). This EUA will remain  in effect (meaning this test can be used) for the duration of the COVID-19 declaration under Section 564(b)(1) of the Act, 21 U.S.C.section 360bbb-3(b)(1), unless the authorization is terminated  or revoked sooner.       Influenza A by PCR NEGATIVE NEGATIVE Final   Influenza B by PCR NEGATIVE NEGATIVE Final    Comment: (NOTE) The Xpert Xpress SARS-CoV-2/FLU/RSV plus assay is intended as an aid in the diagnosis of  influenza from Nasopharyngeal swab specimens and should not be used as a sole basis for treatment. Nasal washings and aspirates are unacceptable for Xpert Xpress SARS-CoV-2/FLU/RSV testing.  Fact Sheet for Patients: EntrepreneurPulse.com.au  Fact Sheet for Healthcare Providers: IncredibleEmployment.be  This test is not yet approved or cleared by the [place] FDA and has been authorized for detection and/or diagnosis of SARS-CoV-2 by FDA under an Emergency Use Authorization (EUA). This EUA will remain in effect (meaning this test can be used) for the duration of the COVID-19 declaration under Section 564(b)(1) of the Act, 21 U.S.C. section 360bbb-3(b)(1), unless the authorization is terminated or revoked.  Performed at [facility], [place] [address] [place],  [address]     Renal Function: Recent Labs    03/15/21 1521 03/15/21 1620 03/16/21 0300 03/17/21 0256 03/18/21 0304 03/19/21 0159 03/20/21 1305  CREATININE 1.53* 1.40* 1.42* 1.24 1.10 1.08 1.01   Estimated Creatinine Clearance: 83.8 mL/min (by C-G formula based on SCr of 1.01 mg/dL).  Radiologic Imaging: CT HEAD WO CONTRAST (5MM)  Result Date: 03/20/2021 CLINICAL DATA:  Delirium,  history of urinary tract infection, chest pain EXAM: CT HEAD WITHOUT CONTRAST TECHNIQUE: Contiguous axial images were obtained from the base of the skull through the vertex without intravenous contrast. COMPARISON:  02/03/2021 FINDINGS: Brain: No acute infarct or hemorrhage. Lateral ventricles and midline structures are unremarkable. No acute extra-axial fluid collections. No mass effect. Vascular: Stable atherosclerosis.  No hyperdense vessel. Skull: Normal. Negative for fracture or focal lesion. Sinuses/Orbits: Stable opacification of the left maxillary sinus. Remaining paranasal sinuses are clear. Other: None. IMPRESSION: 1. Stable head CT, no acute intracranial process. Electronically Signed   By: Randa Ngo M.D.   On: 03/20/2021 16:17   CT Chest W Contrast  Result Date: 03/20/2021 CLINICAL DATA:  Urinary retention, UTI, more confused today than usual, altered mental status chest pain, history atrial fibrillation, alcoholic cirrhosis, coronary artery disease, diabetes mellitus, portal hypertension, former smoker EXAM: CT CHEST, ABDOMEN, AND PELVIS WITH CONTRAST TECHNIQUE: Multidetector CT imaging of the chest, abdomen and pelvis was performed following the standard protocol during bolus administration of intravenous contrast. Sagittal and coronal MPR images reconstructed from axial data set. CONTRAST:  158m OMNIPAQUE IOHEXOL 350 MG/ML SOLN IV. No oral contrast. COMPARISON:  CT abdomen and pelvis 08/14/2019 FINDINGS: CT CHEST FINDINGS Cardiovascular: Atherosclerotic calcifications aorta, coronary arteries and proximal great vessels. Aorta normal caliber. Heart unremarkable. No pericardial effusion. Lower lobe pulmonary arterial assessment limited by beam hardening artifacts and respiratory motion. A questionable filling defect in a RIGHT lower lobe pulmonary artery axial image 31 appears to be an artifact on sagittal images. Mediastinum/Nodes: Esophagus unremarkable. Base of cervical region normal  appearance. No thoracic adenopathy. Lungs/Pleura: Dependent bibasilar atelectasis and respiratory motion artifacts. No definite pulmonary infiltrate, pleural effusion or pneumothorax. Musculoskeletal: Prior median sternotomy. Bones demineralized. No acute osseous findings. CT ABDOMEN PELVIS FINDINGS Hepatobiliary: Gallbladder contracted with dependent calculi lower gallbladder segment. Cirrhotic appearing nodular liver without discrete mass. Pancreas: Atrophic pancreas without mass Spleen: Normal appearance Adrenals/Urinary Tract: Adrenal thickening without mass. Cyst at upper pole LEFT kidney 4.3 cm greatest diameter. Exophytic cyst upper to mid RIGHT kidney 1.8 cm diameter. No additional renal mass, hydronephrosis or ureteral dilatation. No urinary tract calcifications. Bladder is partially distended and demonstrates a thickened irregular wall with minimal adjacent infiltrative changes, pattern suggesting cystitis and probably a degree of wall trabeculation. Small bladder diverticula noted. Difficult to exclude mass in this setting. Stomach/Bowel: Sigmoid diverticulosis. Stomach and bowel  loops otherwise unremarkable. Vascular/Lymphatic: Atherosclerotic calcifications aorta, visceral arteries, iliac arteries. Aorta normal caliber. No adenopathy. Reproductive: Unremarkable prostate gland and seminal vesicles Other: Umbilical hernia and 2 supraumbilical ventral hernias are identified, all containing fat. No free air or free fluid. Interval decrease in size of a small subcutaneous fluid collection in the LEFT anterior abdominal wall. Musculoskeletal: Osseous demineralization. Degenerative disc and facet disease changes lumbar spine. IMPRESSION: Thickened irregular bladder wall with minimal adjacent infiltrative changes, pattern suggesting cystitis and probably a degree of wall trabeculation. Small bladder diverticula noted. Difficult to exclude mass in this setting, recommend correlation with cystoscopy. Cirrhotic  appearing liver without discrete mass. Cholelithiasis. Umbilical and 2 supraumbilical ventral hernias containing fat. Sigmoid diverticulosis. Aortic Atherosclerosis (ICD10-I70.0). Electronically Signed   By: Lavonia Dana M.D.   On: 03/20/2021 16:28   CT ABDOMEN PELVIS W CONTRAST  Result Date: 03/20/2021 CLINICAL DATA:  Urinary retention, UTI, more confused today than usual, altered mental status chest pain, history atrial fibrillation, alcoholic cirrhosis, coronary artery disease, diabetes mellitus, portal hypertension, former smoker EXAM: CT CHEST, ABDOMEN, AND PELVIS WITH CONTRAST TECHNIQUE: Multidetector CT imaging of the chest, abdomen and pelvis was performed following the standard protocol during bolus administration of intravenous contrast. Sagittal and coronal MPR images reconstructed from axial data set. CONTRAST:  175m OMNIPAQUE IOHEXOL 350 MG/ML SOLN IV. No oral contrast. COMPARISON:  CT abdomen and pelvis 08/14/2019 FINDINGS: CT CHEST FINDINGS Cardiovascular: Atherosclerotic calcifications aorta, coronary arteries and proximal great vessels. Aorta normal caliber. Heart unremarkable. No pericardial effusion. Lower lobe pulmonary arterial assessment limited by beam hardening artifacts and respiratory motion. A questionable filling defect in a RIGHT lower lobe pulmonary artery axial image 31 appears to be an artifact on sagittal images. Mediastinum/Nodes: Esophagus unremarkable. Base of cervical region normal appearance. No thoracic adenopathy. Lungs/Pleura: Dependent bibasilar atelectasis and respiratory motion artifacts. No definite pulmonary infiltrate, pleural effusion or pneumothorax. Musculoskeletal: Prior median sternotomy. Bones demineralized. No acute osseous findings. CT ABDOMEN PELVIS FINDINGS Hepatobiliary: Gallbladder contracted with dependent calculi lower gallbladder segment. Cirrhotic appearing nodular liver without discrete mass. Pancreas: Atrophic pancreas without mass Spleen: Normal  appearance Adrenals/Urinary Tract: Adrenal thickening without mass. Cyst at upper pole LEFT kidney 4.3 cm greatest diameter. Exophytic cyst upper to mid RIGHT kidney 1.8 cm diameter. No additional renal mass, hydronephrosis or ureteral dilatation. No urinary tract calcifications. Bladder is partially distended and demonstrates a thickened irregular wall with minimal adjacent infiltrative changes, pattern suggesting cystitis and probably a degree of wall trabeculation. Small bladder diverticula noted. Difficult to exclude mass in this setting. Stomach/Bowel: Sigmoid diverticulosis. Stomach and bowel loops otherwise unremarkable. Vascular/Lymphatic: Atherosclerotic calcifications aorta, visceral arteries, iliac arteries. Aorta normal caliber. No adenopathy. Reproductive: Unremarkable prostate gland and seminal vesicles Other: Umbilical hernia and 2 supraumbilical ventral hernias are identified, all containing fat. No free air or free fluid. Interval decrease in size of a small subcutaneous fluid collection in the LEFT anterior abdominal wall. Musculoskeletal: Osseous demineralization. Degenerative disc and facet disease changes lumbar spine. IMPRESSION: Thickened irregular bladder wall with minimal adjacent infiltrative changes, pattern suggesting cystitis and probably a degree of wall trabeculation. Small bladder diverticula noted. Difficult to exclude mass in this setting, recommend correlation with cystoscopy. Cirrhotic appearing liver without discrete mass. Cholelithiasis. Umbilical and 2 supraumbilical ventral hernias containing fat. Sigmoid diverticulosis. Aortic Atherosclerosis (ICD10-I70.0). Electronically Signed   By: MLavonia DanaM.D.   On: 03/20/2021 16:28   DG Chest Portable 1 View  Result Date: 03/20/2021 CLINICAL DATA:  Altered mental status. EXAM: PORTABLE  CHEST 1 VIEW COMPARISON:  Chest radiograph 03/15/2021 FINDINGS: Diffuse mild hazy interstitial thickening. No focal consolidation, pleural  effusion, or pneumothorax. Stable cardiomediastinal silhouette with cardiomegaly. Aortic calcifications. Sternal wires are aligned and intact. IMPRESSION: Diffuse mild interstitial opacities, suggestive of mild pulmonary edema versus atypical/viral infection. Electronically Signed   By: Ileana Roup M.D.   On: 03/20/2021 13:35    I independently reviewed the above imaging studies.  Impression/Recommendation UTI and urinary retention: He has a persistent UTI from his recent hospitalization likely due to his urinary retention.  Would treat with antibiotics based on recent culture results and keep urethral catheter.  He should improve and respond to antibiotics with bladder drainage.  He will likely need to be discharged with his indwelling catheter for now as he is already on maximal medical therapy and will then need to be re-evaluated as an outpatient for further urologic management once his acute illness is resolved.  Dutch Gray 03/20/2021, 6:27 PM    Pryor Curia MD  CC: Dr. Tana Coast

## 2021-03-20 NOTE — ED Provider Notes (Signed)
Patient received from Waldon Reining, PA at change of shift. Patient awaiting completion of diagnostic testing in the evaluation of AMS. Patient recently admitted for UTI and AKI. Discharged home yesterday. More altered at care facility today.  Labs and radiology results reviewed.  Patient reassessed. He is able to follow commands. He is oriented to name, and knows he is in a hospital in [place]. He is confused to time and event. Patient is normally alert and oriented per nursing facility staff. Patient had foley cath placed in ED, 1800 cc of output, cloudy chocolate milk appearing sediment. Patient is currently on oral cipro. Patient discussed with and seen by Dr. Doren Custard. Will ask hospitalist to admit due to persistent AMS and cystitis.  Discussed with Dr. Areta Haber admit.  Results for orders placed or performed during the hospital encounter of 03/20/21  Resp Panel by RT-PCR (Flu A&B, Covid) Nasopharyngeal Swab   Specimen: Nasopharyngeal Swab; Nasopharyngeal(NP) swabs in vial transport medium  Result Value Ref Range   SARS Coronavirus 2 by RT PCR NEGATIVE NEGATIVE   Influenza A by PCR NEGATIVE NEGATIVE   Influenza B by PCR NEGATIVE NEGATIVE  Comprehensive metabolic panel  Result Value Ref Range   Sodium 140 135 - 145 mmol/L   Potassium 4.2 3.5 - 5.1 mmol/L   Chloride 106 98 - 111 mmol/L   CO2 26 22 - 32 mmol/L   Glucose, Bld 103 (H) 70 - 99 mg/dL   BUN 18 8 - 23 mg/dL   Creatinine, Ser 1.01 0.61 - 1.24 mg/dL   Calcium 9.9 8.9 - 10.3 mg/dL   Total Protein 6.6 6.5 - 8.1 g/dL   Albumin 2.9 (L) 3.5 - 5.0 g/dL   AST 12 (L) 15 - 41 U/L   ALT 9 0 - 44 U/L   Alkaline Phosphatase 94 38 - 126 U/L   Total Bilirubin 0.5 0.3 - 1.2 mg/dL   GFR, Estimated >60 >60 mL/min   Anion gap 8 5 - 15  CBC  Result Value Ref Range   WBC 6.8 4.0 - 10.5 K/uL   RBC 4.43 4.22 - 5.81 MIL/uL   Hemoglobin 12.6 (L) 13.0 - 17.0 g/dL   HCT 42.0 39.0 - 52.0 %   MCV 94.8 80.0 - 100.0 fL   MCH 28.4 26.0 - 34.0 pg    MCHC 30.0 30.0 - 36.0 g/dL   RDW 14.4 11.5 - 15.5 %   Platelets 169 150 - 400 K/uL   nRBC 0.0 0.0 - 0.2 %  Protime-INR - (order if patient is taking Coumadin / Warfarin)  Result Value Ref Range   Prothrombin Time 14.0 11.4 - 15.2 seconds   INR 1.1 0.8 - 1.2  Ammonia  Result Value Ref Range   Ammonia 29 9 - 35 umol/L  Urinalysis, Routine w reflex microscopic Urine, Clean Catch  Result Value Ref Range   Color, Urine AMBER (A) YELLOW   APPearance TURBID (A) CLEAR   Specific Gravity, Urine 1.013 1.005 - 1.030   pH 7.0 5.0 - 8.0   Glucose, UA NEGATIVE NEGATIVE mg/dL   Hgb urine dipstick MODERATE (A) NEGATIVE   Bilirubin Urine NEGATIVE NEGATIVE   Ketones, ur NEGATIVE NEGATIVE mg/dL   Protein, ur 100 (A) NEGATIVE mg/dL   Nitrite NEGATIVE NEGATIVE   Leukocytes,Ua LARGE (A) NEGATIVE   RBC / HPF 21-50 0 - 5 RBC/hpf   WBC, UA >50 (H) 0 - 5 WBC/hpf   Bacteria, UA RARE (A) NONE SEEN   Squamous Epithelial / LPF 0-5  0 - 5  Brain natriuretic peptide  Result Value Ref Range   B Natriuretic Peptide 168.3 (H) 0.0 - 100.0 pg/mL  CBG monitoring, ED  Result Value Ref Range   Glucose-Capillary 106 (H) 70 - 99 mg/dL   DG Chest 1 View  Result Date: 03/15/2021 CLINICAL DATA:  Sacral osteomyelitis, weakness EXAM: CHEST  1 VIEW COMPARISON:  02/08/2021 FINDINGS: Single frontal view of the chest demonstrates an unremarkable cardiac silhouette. Postsurgical changes from median sternotomy. Stable atherosclerosis of the aorta. No airspace disease, effusion, or pneumothorax. No acute bony abnormality. IMPRESSION: 1. Stable chest, no acute process. Electronically Signed   By: Randa Ngo M.D.   On: 03/15/2021 17:02   DG Sacrum/Coccyx  Result Date: 03/15/2021 CLINICAL DATA:  Wound EXAM: SACRUM AND COCCYX - 2+ VIEW COMPARISON:  02/03/2021 FINDINGS: Pubic symphysis and rami are intact. No SI joint widening. No fracture or malalignment. No definitive osseous destructive change. IMPRESSION: No definite acute  osseous abnormality. Cross-sectional imaging follow-up if continued suspicion for infection Electronically Signed   By: Donavan Foil M.D.   On: 03/15/2021 16:47   DG Knee 1-2 Views Right  Result Date: 03/15/2021 CLINICAL DATA:  Knee pain EXAM: RIGHT KNEE - 1-2 VIEW COMPARISON:  02/04/2021 FINDINGS: Partially visualized surgical plate and fixating screws across comminuted extra-articular distal femoral fracture with residual lucency and no abundant callus. Patellar fracture with less apparent lucency suggests some interval healing. Diffuse soft tissue swelling with effusion. Advanced arthritis of the knee. IMPRESSION: 1. Partially visualized surgical plate and fixating screws in the distal femur across comminuted distal femoral fracture without much interval bridging callus. 2. Redemonstrated patella fracture with less visible lucency suggestive of some healing 3. Arthritis of the knee Electronically Signed   By: Donavan Foil M.D.   On: 03/15/2021 19:40   CT HEAD WO CONTRAST (5MM)  Result Date: 03/20/2021 CLINICAL DATA:  Delirium, history of urinary tract infection, chest pain EXAM: CT HEAD WITHOUT CONTRAST TECHNIQUE: Contiguous axial images were obtained from the base of the skull through the vertex without intravenous contrast. COMPARISON:  02/03/2021 FINDINGS: Brain: No acute infarct or hemorrhage. Lateral ventricles and midline structures are unremarkable. No acute extra-axial fluid collections. No mass effect. Vascular: Stable atherosclerosis.  No hyperdense vessel. Skull: Normal. Negative for fracture or focal lesion. Sinuses/Orbits: Stable opacification of the left maxillary sinus. Remaining paranasal sinuses are clear. Other: None. IMPRESSION: 1. Stable head CT, no acute intracranial process. Electronically Signed   By: Randa Ngo M.D.   On: 03/20/2021 16:17   CT Chest W Contrast  Result Date: 03/20/2021 CLINICAL DATA:  Urinary retention, UTI, more confused today than usual, altered mental  status chest pain, history atrial fibrillation, alcoholic cirrhosis, coronary artery disease, diabetes mellitus, portal hypertension, former smoker EXAM: CT CHEST, ABDOMEN, AND PELVIS WITH CONTRAST TECHNIQUE: Multidetector CT imaging of the chest, abdomen and pelvis was performed following the standard protocol during bolus administration of intravenous contrast. Sagittal and coronal MPR images reconstructed from axial data set. CONTRAST:  170m OMNIPAQUE IOHEXOL 350 MG/ML SOLN IV. No oral contrast. COMPARISON:  CT abdomen and pelvis 08/14/2019 FINDINGS: CT CHEST FINDINGS Cardiovascular: Atherosclerotic calcifications aorta, coronary arteries and proximal great vessels. Aorta normal caliber. Heart unremarkable. No pericardial effusion. Lower lobe pulmonary arterial assessment limited by beam hardening artifacts and respiratory motion. A questionable filling defect in a RIGHT lower lobe pulmonary artery axial image 31 appears to be an artifact on sagittal images. Mediastinum/Nodes: Esophagus unremarkable. Base of cervical region normal appearance. No  thoracic adenopathy. Lungs/Pleura: Dependent bibasilar atelectasis and respiratory motion artifacts. No definite pulmonary infiltrate, pleural effusion or pneumothorax. Musculoskeletal: Prior median sternotomy. Bones demineralized. No acute osseous findings. CT ABDOMEN PELVIS FINDINGS Hepatobiliary: Gallbladder contracted with dependent calculi lower gallbladder segment. Cirrhotic appearing nodular liver without discrete mass. Pancreas: Atrophic pancreas without mass Spleen: Normal appearance Adrenals/Urinary Tract: Adrenal thickening without mass. Cyst at upper pole LEFT kidney 4.3 cm greatest diameter. Exophytic cyst upper to mid RIGHT kidney 1.8 cm diameter. No additional renal mass, hydronephrosis or ureteral dilatation. No urinary tract calcifications. Bladder is partially distended and demonstrates a thickened irregular wall with minimal adjacent infiltrative  changes, pattern suggesting cystitis and probably a degree of wall trabeculation. Small bladder diverticula noted. Difficult to exclude mass in this setting. Stomach/Bowel: Sigmoid diverticulosis. Stomach and bowel loops otherwise unremarkable. Vascular/Lymphatic: Atherosclerotic calcifications aorta, visceral arteries, iliac arteries. Aorta normal caliber. No adenopathy. Reproductive: Unremarkable prostate gland and seminal vesicles Other: Umbilical hernia and 2 supraumbilical ventral hernias are identified, all containing fat. No free air or free fluid. Interval decrease in size of a small subcutaneous fluid collection in the LEFT anterior abdominal wall. Musculoskeletal: Osseous demineralization. Degenerative disc and facet disease changes lumbar spine. IMPRESSION: Thickened irregular bladder wall with minimal adjacent infiltrative changes, pattern suggesting cystitis and probably a degree of wall trabeculation. Small bladder diverticula noted. Difficult to exclude mass in this setting, recommend correlation with cystoscopy. Cirrhotic appearing liver without discrete mass. Cholelithiasis. Umbilical and 2 supraumbilical ventral hernias containing fat. Sigmoid diverticulosis. Aortic Atherosclerosis (ICD10-I70.0). Electronically Signed   By: Lavonia Dana M.D.   On: 03/20/2021 16:28   CT ABDOMEN PELVIS W CONTRAST  Result Date: 03/20/2021 CLINICAL DATA:  Urinary retention, UTI, more confused today than usual, altered mental status chest pain, history atrial fibrillation, alcoholic cirrhosis, coronary artery disease, diabetes mellitus, portal hypertension, former smoker EXAM: CT CHEST, ABDOMEN, AND PELVIS WITH CONTRAST TECHNIQUE: Multidetector CT imaging of the chest, abdomen and pelvis was performed following the standard protocol during bolus administration of intravenous contrast. Sagittal and coronal MPR images reconstructed from axial data set. CONTRAST:  123m OMNIPAQUE IOHEXOL 350 MG/ML SOLN IV. No oral  contrast. COMPARISON:  CT abdomen and pelvis 08/14/2019 FINDINGS: CT CHEST FINDINGS Cardiovascular: Atherosclerotic calcifications aorta, coronary arteries and proximal great vessels. Aorta normal caliber. Heart unremarkable. No pericardial effusion. Lower lobe pulmonary arterial assessment limited by beam hardening artifacts and respiratory motion. A questionable filling defect in a RIGHT lower lobe pulmonary artery axial image 31 appears to be an artifact on sagittal images. Mediastinum/Nodes: Esophagus unremarkable. Base of cervical region normal appearance. No thoracic adenopathy. Lungs/Pleura: Dependent bibasilar atelectasis and respiratory motion artifacts. No definite pulmonary infiltrate, pleural effusion or pneumothorax. Musculoskeletal: Prior median sternotomy. Bones demineralized. No acute osseous findings. CT ABDOMEN PELVIS FINDINGS Hepatobiliary: Gallbladder contracted with dependent calculi lower gallbladder segment. Cirrhotic appearing nodular liver without discrete mass. Pancreas: Atrophic pancreas without mass Spleen: Normal appearance Adrenals/Urinary Tract: Adrenal thickening without mass. Cyst at upper pole LEFT kidney 4.3 cm greatest diameter. Exophytic cyst upper to mid RIGHT kidney 1.8 cm diameter. No additional renal mass, hydronephrosis or ureteral dilatation. No urinary tract calcifications. Bladder is partially distended and demonstrates a thickened irregular wall with minimal adjacent infiltrative changes, pattern suggesting cystitis and probably a degree of wall trabeculation. Small bladder diverticula noted. Difficult to exclude mass in this setting. Stomach/Bowel: Sigmoid diverticulosis. Stomach and bowel loops otherwise unremarkable. Vascular/Lymphatic: Atherosclerotic calcifications aorta, visceral arteries, iliac arteries. Aorta normal caliber. No adenopathy. Reproductive: Unremarkable prostate gland and  seminal vesicles Other: Umbilical hernia and 2 supraumbilical ventral hernias  are identified, all containing fat. No free air or free fluid. Interval decrease in size of a small subcutaneous fluid collection in the LEFT anterior abdominal wall. Musculoskeletal: Osseous demineralization. Degenerative disc and facet disease changes lumbar spine. IMPRESSION: Thickened irregular bladder wall with minimal adjacent infiltrative changes, pattern suggesting cystitis and probably a degree of wall trabeculation. Small bladder diverticula noted. Difficult to exclude mass in this setting, recommend correlation with cystoscopy. Cirrhotic appearing liver without discrete mass. Cholelithiasis. Umbilical and 2 supraumbilical ventral hernias containing fat. Sigmoid diverticulosis. Aortic Atherosclerosis (ICD10-I70.0). Electronically Signed   By: Lavonia Dana M.D.   On: 03/20/2021 16:28   DG Chest Portable 1 View  Result Date: 03/20/2021 CLINICAL DATA:  Altered mental status. EXAM: PORTABLE CHEST 1 VIEW COMPARISON:  Chest radiograph 03/15/2021 FINDINGS: Diffuse mild hazy interstitial thickening. No focal consolidation, pleural effusion, or pneumothorax. Stable cardiomediastinal silhouette with cardiomegaly. Aortic calcifications. Sternal wires are aligned and intact. IMPRESSION: Diffuse mild interstitial opacities, suggestive of mild pulmonary edema versus atypical/viral infection. Electronically Signed   By: Ileana Roup M.D.   On: 03/20/2021 13:35      Etta Quill, NP 03/20/21 [id number]    Godfrey Pick, MD 03/21/21 [phone]

## 2021-03-20 NOTE — ED Triage Notes (Signed)
Pt BIB [facility] from [place]. Pt d/c from [facility] yesterday at 3pm, dx with UTI before d/c. Staff reports pt "more confused than usual" today. Alert to self and place. Following commands. EKG afib, hx of same.  BP 102 palp HR 68 irreg SpO2 98% RA RR 14 Temp 97.3 CBG 120

## 2021-03-20 NOTE — Progress Notes (Addendum)
Called by RN that Son is at bedside and request palliative care consult.  Spoke with son and patient at bedside and they would like a palliative care consult in am to establish care plan going forward.  Pt is a DNR which is confirmed by patient and his son. Code order changed.  Pt was  on cipro when discharged which can  cause confusion. Was changed to cefepime at admission

## 2021-03-21 LAB — GLUCOSE, CAPILLARY
Glucose-Capillary: 99 mg/dL (ref 70–99)
Glucose-Capillary: 101 mg/dL — ABNORMAL HIGH (ref 70–99)
Glucose-Capillary: 191 mg/dL — ABNORMAL HIGH (ref 70–99)
Glucose-Capillary: 121 mg/dL — ABNORMAL HIGH (ref 70–99)

## 2021-03-21 LAB — PROCALCITONIN: Procalcitonin: <0.1 ng/mL

## 2021-03-21 MED ORDER — CHLORHEXIDINE GLUCONATE CLOTH 2 % EX PADS
6.0000 | MEDICATED_PAD | Freq: Every day | CUTANEOUS | Status: DC
  Administered 2021-03-21 – 2021-03-24 (×4): 6 via TOPICAL

## 2021-03-21 MED ORDER — SPIRONOLACTONE 25 MG PO TABS
50.0000 mg | ORAL_TABLET | Freq: Every day | ORAL | Status: DC
  Administered 2021-03-21 – 2021-03-24 (×4): 50 mg via ORAL
  Filled 2021-03-21 (×4): qty 2

## 2021-03-21 NOTE — Progress Notes (Signed)
PROGRESS NOTE    Brandon [name]  [id number] DOB: 07/06/1946 DOA: 03/20/2021 PCP: Helane Rima, MD     Brief Narrative:  Shritan Blatchford is a 75 year old male with a history of A. fib, liver cirrhosis, colon CA, CAD, diabetes, obesity, BPH, right knee surgery 4 weeks ago on therapeutic Lovenox, recent admission 8/19-8/23, was just discharged 8/23.  Patient was treated for UTI, acute kidney injury, elevated ammonia and was discharged on oral ciprofloxacin.  Patient was sent back from the facility, staff reported "patient was more confused than usual". In ED, on examination patient is alert and oriented to self and place, but not able to give a good history.  He denies any fevers or chills, states he has diffuse "aches and pains everywhere", no coughing.  For other questions he states "I don't know". Patient was noted to be retaining urine in ED and Foley catheter was placed with removal of 1.8 L of milky chocolate colored urine/pyuria. CT abdomen showed cystitis, difficult to exclude mass in the setting recommended cystoscopy, liver cirrhosis without discrete mass, cholelithiasis.  New events last 24 hours / Subjective: Patient alert and oriented to year "22" and place "[facility] in [facility]". He states that he was sent here because of buildup of ammonia. Seems to be a poor historian and still a bit confused.   Assessment & Plan:   Principal Problem:   Acute metabolic encephalopathy Active Problems:   CAD (coronary artery disease)   DM (diabetes mellitus), type 2 (HCC)   Obesity BMI over 52   Atrial fibrillation (HCC)   Chronic heart failure with preserved ejection fraction (HFpEF) (HCC)   Cirrhosis of liver (HCC)   Pressure injury of skin   UTI (urinary tract infection)   Acute metabolic encephalopathy is thought to be secondary to urinary retention and UTI -CT head: no acute intracranial process -Seems to be improved from admission  UTI with urinary retention -Foley  catheter was placed at time of admission -CT A/P: Thickened irregular bladder wall with minimal adjacent infiltrative changes, pattern suggesting cystitis and probably a degree of wall trabeculation. Difficult to exclude mass in this setting, recommend correlation with cystoscopy. -Started on IV cefepime -Continue proscar and flomax  -Urology consulted, recommended to keep catheter in place, re-eval as outpatient  -Urine culture pending   Diabetes mellitus type 2 -Continue sliding scale insulin  Chronic A. fib -Continue Coreg -Patient is not on long-term anticoagulation due to prior history of GI bleed and liver cirrhosis however he has been on DVT prophylaxis due to right femoral fracture in 0000000  Chronic diastolic heart failure -Continue Lasix, spironolactone   HLD -Continue pravachol   Cirrhosis -Continue lactulose -Ammonia level 29  Recent distal right femoral fracture July 2022 -PT OT     In agreement with assessment of the pressure ulcer as below:  Pressure Injury 03/20/21 Buttocks Right Stage 2 -  Partial thickness loss of dermis presenting as a shallow open injury with a red, pink wound bed without slough. (Active)  03/20/21 2305  Location: Buttocks  Location Orientation: Right  Staging: Stage 2 -  Partial thickness loss of dermis presenting as a shallow open injury with a red, pink wound bed without slough.  Wound Description (Comments):   Present on Admission: Yes         DVT prophylaxis:  enoxaparin (LOVENOX) injection 70 mg Start: 03/20/21 2245  Code Status:     Code Status Orders  (From admission, onward)  Start     Ordered   03/21/21 0712  Do not attempt resuscitation (DNR)  Continuous       Question Answer Comment  In the event of cardiac or respiratory ARREST Do not call a "code blue"   In the event of cardiac or respiratory ARREST Do not perform Intubation, CPR, defibrillation or ACLS   In the event of cardiac or respiratory  ARREST Use medication by any route, position, wound care, and other measures to relive pain and suffering. May use oxygen, suction and manual treatment of airway obstruction as needed for comfort.      03/21/21 0711           Code Status History     Date Active Date Inactive Code Status Order ID Comments User Context   03/20/2021 2201 03/21/2021 0711 Full Code [id number]  Mendel Corning, MD ED   03/20/2021 1950 03/20/2021 2201 DNR [id number]  Chotiner, Yevonne Aline, MD ED   03/15/2021 1723 03/19/2021 1934 DNR [id number]  Shawna Clamp, MD ED   03/15/2021 1723 03/15/2021 1723 Full Code [id number]  Shawna Clamp, MD ED   03/15/2021 1722 03/15/2021 1723 Full Code [id number]  Shawna Clamp, MD ED   02/03/2021 2247 02/12/2021 0206 DNR [id number]  Rhetta Mura, DO ED   06/06/2020 0652 06/08/2020 1854 DNR [id number]  Jean Rosenthal, MD ED   05/25/2020 1829 06/04/2020 2011 DNR [id number]  Madalyn Rob, MD ED   10/02/2019 2126 10/06/2019 2143 DNR [id number]  Bennie Pierini, MD Inpatient   09/16/2019 0831 09/19/2019 2047 DNR [id number]  Kathi Ludwig, MD ED   08/13/2019 1922 08/16/2019 1729 DNR [id number]  Bonnita Hollow, MD ED   08/13/2019 1922 08/13/2019 1922 Partial Code [id number]  Bonnita Hollow, MD ED   08/13/2019 1922 08/13/2019 1922 DNR [id number]  Bonnita Hollow, MD ED   09/29/2018 2250 10/08/2018 2301 DNR [id number]  Benay Pike, MD ED   08/23/2018 1929 08/25/2018 2043 DNR [id number]  Toy Baker, MD Inpatient      Family Communication: No family at bedside Disposition Plan:  Status is: Inpatient  Remains inpatient appropriate because:Altered mental status  Dispo: The patient is from: SNF              Anticipated d/c is to: SNF              Patient currently is not medically stable to d/c.   Difficult to place patient No      Consultants:  Urology   Procedures:  None   Antimicrobials:  Anti-infectives (From admission, onward)    Start     Dose/Rate Route Frequency  Ordered Stop   03/20/21 1830  ceFEPIme (MAXIPIME) 2 g in sodium chloride 0.9 % 100 mL IVPB        2 g 200 mL/hr over 30 Minutes Intravenous Every 12 hours 03/20/21 1806          Objective: Vitals:   03/20/21 1700 03/20/21 2100 03/20/21 2327 03/21/21 0447  BP: (!) 122/102 (!) 138/124 126/76 129/76  Pulse: 92 88 79 66  Resp: '20 15 20 20  '$ Temp:   98.4 F (36.9 C) 98.3 F (36.8 C)  TempSrc:   Oral Oral  SpO2: 98% 98% 99% 97%    Intake/Output Summary (Last 24 hours) at 03/21/2021 1055 Last data filed at 03/21/2021 0949 Gross per 24 hour  Intake 345.94 ml  Output 1200 ml  Net -854.06 ml   There were no vitals filed for this  visit.  Examination:  General exam: Appears calm and comfortable  Respiratory system: Clear to auscultation. Respiratory effort normal. No respiratory distress. No conversational dyspnea.  Cardiovascular system: S1 & S2 heard, RRR. No murmurs. No pedal edema. Gastrointestinal system: Abdomen is nondistended, soft and nontender. Normal bowel sounds heard. Central nervous system: Alert and oriented to self, year, place but overall poor historian  Extremities: Symmetric in appearance  Skin: mild erythema at site of left PIV  Psychiatry: Judgement and insight appear poor   Data Reviewed: I have personally reviewed following labs and imaging studies  CBC: Recent Labs  Lab 03/15/21 1521 03/15/21 1620 03/16/21 0300 03/17/21 0256 03/18/21 0304 03/20/21 1305  WBC 10.3  --  7.9 6.6 6.6 6.8  NEUTROABS 7.8*  --   --   --   --   --   HGB 13.4 15.0  15.0 13.0 11.8* 11.4* 12.6*  HCT 45.0 44.0  44.0 42.8 40.8 37.7* 42.0  MCV 94.3  --  92.0 98.1 92.4 94.8  PLT 231  --  231 185 185 [id number]   Basic Metabolic Panel: Recent Labs  Lab 03/16/21 0300 03/17/21 0256 03/18/21 0304 03/19/21 0159 03/20/21 1305  NA 136 135 135 133* 140  K 4.2 3.9 3.7 4.6 4.2  CL 99 103 103 101 106  CO2 '25 23 24 25 26  '$ GLUCOSE 137* 117* 100* 106* 103*  BUN 45* 37* '23 19 18   '$ CREATININE 1.42* 1.24 1.10 1.08 1.01  CALCIUM 9.9 9.3 9.3 9.2 9.9  MG 1.7  --   --   --   --   PHOS 3.0  --   --   --   --    GFR: Estimated Creatinine Clearance: 83.8 mL/min (by C-G formula based on SCr of 1.01 mg/dL). Liver Function Tests: Recent Labs  Lab 03/15/21 1521 03/16/21 0300 03/20/21 1305  AST 12* 15 12*  ALT '8 8 9  '$ ALKPHOS 104 103 94  BILITOT 0.8 0.7 0.5  PROT 6.5 6.4* 6.6  ALBUMIN 2.7* 2.6* 2.9*   No results for input(s): LIPASE, AMYLASE in the last 168 hours. Recent Labs  Lab 03/15/21 1523 03/16/21 0300 03/17/21 0256 03/20/21 1305  AMMONIA 97* 101* 63* 29   Coagulation Profile: Recent Labs  Lab 03/15/21 1521 03/20/21 1305  INR 1.2 1.1   Cardiac Enzymes: No results for input(s): CKTOTAL, CKMB, CKMBINDEX, TROPONINI in the last 168 hours. BNP (last 3 results) No results for input(s): PROBNP in the last 8760 hours. HbA1C: No results for input(s): HGBA1C in the last 72 hours. CBG: Recent Labs  Lab 03/19/21 0744 03/19/21 1132 03/20/21 1311 03/20/21 2326 03/21/21 0743  GLUCAP 105* 131* 106* 173* 99   Lipid Profile: No results for input(s): CHOL, HDL, LDLCALC, TRIG, CHOLHDL, LDLDIRECT in the last 72 hours. Thyroid Function Tests: No results for input(s): TSH, T4TOTAL, FREET4, T3FREE, THYROIDAB in the last 72 hours. Anemia Panel: No results for input(s): VITAMINB12, FOLATE, FERRITIN, TIBC, IRON, RETICCTPCT in the last 72 hours. Sepsis Labs: Recent Labs  Lab 03/15/21 1521 03/15/21 1721 03/20/21 2313  PROCALCITON  --   --  <0.10  LATICACIDVEN 2.2* 2.0*  --     Recent Results (from the past 240 hour(s))  Urine Culture     Status: Abnormal   Collection Time: 03/15/21  3:21 PM   Specimen: In/Out Cath Urine  Result Value Ref Range Status   Specimen Description IN/OUT CATH URINE  Final   Special Requests   Final  NONE Performed at [facility], [place] [address] [place], [place] [address]    Culture >=100,000 COLONIES/mL ENTEROBACTER  CLOACAE (A)  Final   Report Status 03/18/2021 FINAL  Final   Organism ID, Bacteria ENTEROBACTER CLOACAE (A)  Final      Susceptibility   Enterobacter cloacae - MIC*    CEFAZOLIN >=64 RESISTANT Resistant     CEFEPIME <=0.12 SENSITIVE Sensitive     CIPROFLOXACIN <=0.25 SENSITIVE Sensitive     GENTAMICIN <=1 SENSITIVE Sensitive     IMIPENEM 0.5 SENSITIVE Sensitive     NITROFURANTOIN 32 SENSITIVE Sensitive     TRIMETH/SULFA <=20 SENSITIVE Sensitive     PIP/TAZO <=4 SENSITIVE Sensitive     * >=100,000 COLONIES/mL ENTEROBACTER CLOACAE  Blood Culture (routine x 2)     Status: None   Collection Time: 03/15/21  3:35 PM   Specimen: BLOOD RIGHT HAND  Result Value Ref Range Status   Specimen Description BLOOD RIGHT HAND  Final   Special Requests   Final    BOTTLES DRAWN AEROBIC AND ANAEROBIC Blood Culture results may not be optimal due to an inadequate volume of blood received in culture bottles   Culture   Final    NO GROWTH 5 DAYS Performed at [facility], [place] [address] [place], [place] [address]    Report Status 03/20/2021 FINAL  Final  Blood Culture (routine x 2)     Status: None   Collection Time: 03/15/21  3:35 PM   Specimen: BLOOD LEFT HAND  Result Value Ref Range Status   Specimen Description BLOOD LEFT HAND  Final   Special Requests   Final    BOTTLES DRAWN AEROBIC AND ANAEROBIC Blood Culture results may not be optimal due to an inadequate volume of blood received in culture bottles   Culture   Final    NO GROWTH 5 DAYS Performed at [facility], [place] [address] [place], [place] [address]    Report Status 03/20/2021 FINAL  Final  Resp Panel by RT-PCR (Flu A&B, Covid) Nasopharyngeal Swab     Status: None   Collection Time: 03/15/21  7:30 PM   Specimen: Nasopharyngeal Swab; Nasopharyngeal(NP) swabs in vial transport medium  Result Value Ref Range Status   SARS Coronavirus 2 by RT PCR NEGATIVE NEGATIVE Final    Comment: (NOTE) SARS-CoV-2 target nucleic acids are  NOT DETECTED.  The SARS-CoV-2 RNA is generally detectable in upper respiratory specimens during the acute phase of infection. The lowest concentration of SARS-CoV-2 viral copies this assay can detect is 138 copies/mL. A negative result does not preclude SARS-Cov-2 infection and should not be used as the sole basis for treatment or other patient management decisions. A negative result may occur with  improper specimen collection/handling, submission of specimen other than nasopharyngeal swab, presence of viral mutation(s) within the areas targeted by this assay, and inadequate number of viral copies(<138 copies/mL). A negative result must be combined with clinical observations, patient history, and epidemiological information. The expected result is Negative.  Fact Sheet for Patients:  EntrepreneurPulse.com.au  Fact Sheet for Healthcare Providers:  IncredibleEmployment.be  This test is no t yet approved or cleared by the [place] FDA and  has been authorized for detection and/or diagnosis of SARS-CoV-2 by FDA under an Emergency Use Authorization (EUA). This EUA will remain  in effect (meaning this test can be used) for the duration of the COVID-19 declaration under Section 564(b)(1) of the Act, 21 U.S.C.section 360bbb-3(b)(1), unless the  authorization is terminated  or revoked sooner.       Influenza A by PCR NEGATIVE NEGATIVE Final   Influenza B by PCR NEGATIVE NEGATIVE Final    Comment: (NOTE) The Xpert Xpress SARS-CoV-2/FLU/RSV plus assay is intended as an aid in the diagnosis of influenza from Nasopharyngeal swab specimens and should not be used as a sole basis for treatment. Nasal washings and aspirates are unacceptable for Xpert Xpress SARS-CoV-2/FLU/RSV testing.  Fact Sheet for Patients: EntrepreneurPulse.com.au  Fact Sheet for Healthcare Providers: IncredibleEmployment.be  This test is not  yet approved or cleared by the [place] FDA and has been authorized for detection and/or diagnosis of SARS-CoV-2 by FDA under an Emergency Use Authorization (EUA). This EUA will remain in effect (meaning this test can be used) for the duration of the COVID-19 declaration under Section 564(b)(1) of the Act, 21 U.S.C. section 360bbb-3(b)(1), unless the authorization is terminated or revoked.  Performed at [facility], [place] [address], [place], [place] [address]   Resp Panel by RT-PCR (Flu A&B, Covid) Nasopharyngeal Swab     Status: None   Collection Time: 03/18/21  4:14 PM   Specimen: Nasopharyngeal Swab; Nasopharyngeal(NP) swabs in vial transport medium  Result Value Ref Range Status   SARS Coronavirus 2 by RT PCR NEGATIVE NEGATIVE Final    Comment: (NOTE) SARS-CoV-2 target nucleic acids are NOT DETECTED.  The SARS-CoV-2 RNA is generally detectable in upper respiratory specimens during the acute phase of infection. The lowest concentration of SARS-CoV-2 viral copies this assay can detect is 138 copies/mL. A negative result does not preclude SARS-Cov-2 infection and should not be used as the sole basis for treatment or other patient management decisions. A negative result may occur with  improper specimen collection/handling, submission of specimen other than nasopharyngeal swab, presence of viral mutation(s) within the areas targeted by this assay, and inadequate number of viral copies(<138 copies/mL). A negative result must be combined with clinical observations, patient history, and epidemiological information. The expected result is Negative.  Fact Sheet for Patients:  EntrepreneurPulse.com.au  Fact Sheet for Healthcare Providers:  IncredibleEmployment.be  This test is no t yet approved or cleared by the [place] FDA and  has been authorized for detection and/or diagnosis of SARS-CoV-2 by FDA under an Emergency Use  Authorization (EUA). This EUA will remain  in effect (meaning this test can be used) for the duration of the COVID-19 declaration under Section 564(b)(1) of the Act, 21 U.S.C.section 360bbb-3(b)(1), unless the authorization is terminated  or revoked sooner.       Influenza A by PCR NEGATIVE NEGATIVE Final   Influenza B by PCR NEGATIVE NEGATIVE Final    Comment: (NOTE) The Xpert Xpress SARS-CoV-2/FLU/RSV plus assay is intended as an aid in the diagnosis of influenza from Nasopharyngeal swab specimens and should not be used as a sole basis for treatment. Nasal washings and aspirates are unacceptable for Xpert Xpress SARS-CoV-2/FLU/RSV testing.  Fact Sheet for Patients: EntrepreneurPulse.com.au  Fact Sheet for Healthcare Providers: IncredibleEmployment.be  This test is not yet approved or cleared by the [place] FDA and has been authorized for detection and/or diagnosis of SARS-CoV-2 by FDA under an Emergency Use Authorization (EUA). This EUA will remain in effect (meaning this test can be used) for the duration of the COVID-19 declaration under Section 564(b)(1) of the Act, 21 U.S.C. section 360bbb-3(b)(1), unless the authorization is terminated or revoked.  Performed at [facility], [place] [address], [place], [place] [address]  Resp Panel by RT-PCR (Flu A&B, Covid) Nasopharyngeal Swab     Status: None   Collection Time: 03/20/21  1:05 PM   Specimen: Nasopharyngeal Swab; Nasopharyngeal(NP) swabs in vial transport medium  Result Value Ref Range Status   SARS Coronavirus 2 by RT PCR NEGATIVE NEGATIVE Final    Comment: (NOTE) SARS-CoV-2 target nucleic acids are NOT DETECTED.  The SARS-CoV-2 RNA is generally detectable in upper respiratory specimens during the acute phase of infection. The lowest concentration of SARS-CoV-2 viral copies this assay can detect is 138 copies/mL. A negative result does not preclude SARS-Cov-2 infection  and should not be used as the sole basis for treatment or other patient management decisions. A negative result may occur with  improper specimen collection/handling, submission of specimen other than nasopharyngeal swab, presence of viral mutation(s) within the areas targeted by this assay, and inadequate number of viral copies(<138 copies/mL). A negative result must be combined with clinical observations, patient history, and epidemiological information. The expected result is Negative.  Fact Sheet for Patients:  EntrepreneurPulse.com.au  Fact Sheet for Healthcare Providers:  IncredibleEmployment.be  This test is no t yet approved or cleared by the [place] FDA and  has been authorized for detection and/or diagnosis of SARS-CoV-2 by FDA under an Emergency Use Authorization (EUA). This EUA will remain  in effect (meaning this test can be used) for the duration of the COVID-19 declaration under Section 564(b)(1) of the Act, 21 U.S.C.section 360bbb-3(b)(1), unless the authorization is terminated  or revoked sooner.       Influenza A by PCR NEGATIVE NEGATIVE Final   Influenza B by PCR NEGATIVE NEGATIVE Final    Comment: (NOTE) The Xpert Xpress SARS-CoV-2/FLU/RSV plus assay is intended as an aid in the diagnosis of influenza from Nasopharyngeal swab specimens and should not be used as a sole basis for treatment. Nasal washings and aspirates are unacceptable for Xpert Xpress SARS-CoV-2/FLU/RSV testing.  Fact Sheet for Patients: EntrepreneurPulse.com.au  Fact Sheet for Healthcare Providers: IncredibleEmployment.be  This test is not yet approved or cleared by the [place] FDA and has been authorized for detection and/or diagnosis of SARS-CoV-2 by FDA under an Emergency Use Authorization (EUA). This EUA will remain in effect (meaning this test can be used) for the duration of the COVID-19 declaration  under Section 564(b)(1) of the Act, 21 U.S.C. section 360bbb-3(b)(1), unless the authorization is terminated or revoked.  Performed at [facility], [place] [address] [place], [place] [address]       Radiology Studies: CT HEAD WO CONTRAST (5MM)  Result Date: 03/20/2021 CLINICAL DATA:  Delirium, history of urinary tract infection, chest pain EXAM: CT HEAD WITHOUT CONTRAST TECHNIQUE: Contiguous axial images were obtained from the base of the skull through the vertex without intravenous contrast. COMPARISON:  02/03/2021 FINDINGS: Brain: No acute infarct or hemorrhage. Lateral ventricles and midline structures are unremarkable. No acute extra-axial fluid collections. No mass effect. Vascular: Stable atherosclerosis.  No hyperdense vessel. Skull: Normal. Negative for fracture or focal lesion. Sinuses/Orbits: Stable opacification of the left maxillary sinus. Remaining paranasal sinuses are clear. Other: None. IMPRESSION: 1. Stable head CT, no acute intracranial process. Electronically Signed   By: Randa Ngo M.D.   On: 03/20/2021 16:17   CT Chest W Contrast  Result Date: 03/20/2021 CLINICAL DATA:  Urinary retention, UTI, more confused today than usual, altered mental status chest pain, history atrial fibrillation, alcoholic cirrhosis, coronary artery disease, diabetes mellitus, portal hypertension, former smoker EXAM: CT CHEST, ABDOMEN, AND PELVIS  WITH CONTRAST TECHNIQUE: Multidetector CT imaging of the chest, abdomen and pelvis was performed following the standard protocol during bolus administration of intravenous contrast. Sagittal and coronal MPR images reconstructed from axial data set. CONTRAST:  128m OMNIPAQUE IOHEXOL 350 MG/ML SOLN IV. No oral contrast. COMPARISON:  CT abdomen and pelvis 08/14/2019 FINDINGS: CT CHEST FINDINGS Cardiovascular: Atherosclerotic calcifications aorta, coronary arteries and proximal great vessels. Aorta normal caliber. Heart unremarkable. No  pericardial effusion. Lower lobe pulmonary arterial assessment limited by beam hardening artifacts and respiratory motion. A questionable filling defect in a RIGHT lower lobe pulmonary artery axial image 31 appears to be an artifact on sagittal images. Mediastinum/Nodes: Esophagus unremarkable. Base of cervical region normal appearance. No thoracic adenopathy. Lungs/Pleura: Dependent bibasilar atelectasis and respiratory motion artifacts. No definite pulmonary infiltrate, pleural effusion or pneumothorax. Musculoskeletal: Prior median sternotomy. Bones demineralized. No acute osseous findings. CT ABDOMEN PELVIS FINDINGS Hepatobiliary: Gallbladder contracted with dependent calculi lower gallbladder segment. Cirrhotic appearing nodular liver without discrete mass. Pancreas: Atrophic pancreas without mass Spleen: Normal appearance Adrenals/Urinary Tract: Adrenal thickening without mass. Cyst at upper pole LEFT kidney 4.3 cm greatest diameter. Exophytic cyst upper to mid RIGHT kidney 1.8 cm diameter. No additional renal mass, hydronephrosis or ureteral dilatation. No urinary tract calcifications. Bladder is partially distended and demonstrates a thickened irregular wall with minimal adjacent infiltrative changes, pattern suggesting cystitis and probably a degree of wall trabeculation. Small bladder diverticula noted. Difficult to exclude mass in this setting. Stomach/Bowel: Sigmoid diverticulosis. Stomach and bowel loops otherwise unremarkable. Vascular/Lymphatic: Atherosclerotic calcifications aorta, visceral arteries, iliac arteries. Aorta normal caliber. No adenopathy. Reproductive: Unremarkable prostate gland and seminal vesicles Other: Umbilical hernia and 2 supraumbilical ventral hernias are identified, all containing fat. No free air or free fluid. Interval decrease in size of a small subcutaneous fluid collection in the LEFT anterior abdominal wall. Musculoskeletal: Osseous demineralization. Degenerative disc  and facet disease changes lumbar spine. IMPRESSION: Thickened irregular bladder wall with minimal adjacent infiltrative changes, pattern suggesting cystitis and probably a degree of wall trabeculation. Small bladder diverticula noted. Difficult to exclude mass in this setting, recommend correlation with cystoscopy. Cirrhotic appearing liver without discrete mass. Cholelithiasis. Umbilical and 2 supraumbilical ventral hernias containing fat. Sigmoid diverticulosis. Aortic Atherosclerosis (ICD10-I70.0). Electronically Signed   By: MLavonia DanaM.D.   On: 03/20/2021 16:28   CT ABDOMEN PELVIS W CONTRAST  Result Date: 03/20/2021 CLINICAL DATA:  Urinary retention, UTI, more confused today than usual, altered mental status chest pain, history atrial fibrillation, alcoholic cirrhosis, coronary artery disease, diabetes mellitus, portal hypertension, former smoker EXAM: CT CHEST, ABDOMEN, AND PELVIS WITH CONTRAST TECHNIQUE: Multidetector CT imaging of the chest, abdomen and pelvis was performed following the standard protocol during bolus administration of intravenous contrast. Sagittal and coronal MPR images reconstructed from axial data set. CONTRAST:  1056mOMNIPAQUE IOHEXOL 350 MG/ML SOLN IV. No oral contrast. COMPARISON:  CT abdomen and pelvis 08/14/2019 FINDINGS: CT CHEST FINDINGS Cardiovascular: Atherosclerotic calcifications aorta, coronary arteries and proximal great vessels. Aorta normal caliber. Heart unremarkable. No pericardial effusion. Lower lobe pulmonary arterial assessment limited by beam hardening artifacts and respiratory motion. A questionable filling defect in a RIGHT lower lobe pulmonary artery axial image 31 appears to be an artifact on sagittal images. Mediastinum/Nodes: Esophagus unremarkable. Base of cervical region normal appearance. No thoracic adenopathy. Lungs/Pleura: Dependent bibasilar atelectasis and respiratory motion artifacts. No definite pulmonary infiltrate, pleural effusion or  pneumothorax. Musculoskeletal: Prior median sternotomy. Bones demineralized. No acute osseous findings. CT ABDOMEN PELVIS FINDINGS Hepatobiliary: Gallbladder contracted with  dependent calculi lower gallbladder segment. Cirrhotic appearing nodular liver without discrete mass. Pancreas: Atrophic pancreas without mass Spleen: Normal appearance Adrenals/Urinary Tract: Adrenal thickening without mass. Cyst at upper pole LEFT kidney 4.3 cm greatest diameter. Exophytic cyst upper to mid RIGHT kidney 1.8 cm diameter. No additional renal mass, hydronephrosis or ureteral dilatation. No urinary tract calcifications. Bladder is partially distended and demonstrates a thickened irregular wall with minimal adjacent infiltrative changes, pattern suggesting cystitis and probably a degree of wall trabeculation. Small bladder diverticula noted. Difficult to exclude mass in this setting. Stomach/Bowel: Sigmoid diverticulosis. Stomach and bowel loops otherwise unremarkable. Vascular/Lymphatic: Atherosclerotic calcifications aorta, visceral arteries, iliac arteries. Aorta normal caliber. No adenopathy. Reproductive: Unremarkable prostate gland and seminal vesicles Other: Umbilical hernia and 2 supraumbilical ventral hernias are identified, all containing fat. No free air or free fluid. Interval decrease in size of a small subcutaneous fluid collection in the LEFT anterior abdominal wall. Musculoskeletal: Osseous demineralization. Degenerative disc and facet disease changes lumbar spine. IMPRESSION: Thickened irregular bladder wall with minimal adjacent infiltrative changes, pattern suggesting cystitis and probably a degree of wall trabeculation. Small bladder diverticula noted. Difficult to exclude mass in this setting, recommend correlation with cystoscopy. Cirrhotic appearing liver without discrete mass. Cholelithiasis. Umbilical and 2 supraumbilical ventral hernias containing fat. Sigmoid diverticulosis. Aortic Atherosclerosis  (ICD10-I70.0). Electronically Signed   By: Lavonia Dana M.D.   On: 03/20/2021 16:28   DG Chest Portable 1 View  Result Date: 03/20/2021 CLINICAL DATA:  Altered mental status. EXAM: PORTABLE CHEST 1 VIEW COMPARISON:  Chest radiograph 03/15/2021 FINDINGS: Diffuse mild hazy interstitial thickening. No focal consolidation, pleural effusion, or pneumothorax. Stable cardiomediastinal silhouette with cardiomegaly. Aortic calcifications. Sternal wires are aligned and intact. IMPRESSION: Diffuse mild interstitial opacities, suggestive of mild pulmonary edema versus atypical/viral infection. Electronically Signed   By: Ileana Roup M.D.   On: 03/20/2021 13:35      Scheduled Meds:  carvedilol  25 mg Oral BID WC   Chlorhexidine Gluconate Cloth  6 each Topical Daily   enoxaparin  70 mg Subcutaneous QHS   finasteride  5 mg Oral Daily   furosemide  20 mg Oral BID   gabapentin  300 mg Oral TID   insulin aspart  0-15 Units Subcutaneous TID WC   insulin aspart  0-5 Units Subcutaneous QHS   lactulose  20 g Oral Daily   nystatin ointment   Topical BID   pantoprazole  40 mg Oral Daily   pravastatin  10 mg Oral QHS   tamsulosin  0.4 mg Oral QHS   Continuous Infusions:  ceFEPime (MAXIPIME) IV 2 g (03/21/21 0534)     LOS: 1 day      Time spent: 25 minutes   Dessa Phi, DO [facility] 03/21/2021, 10:55 AM   Available via Epic secure chat 7am-7pm After these hours, please refer to coverage provider listed on [email or web address]

## 2021-03-21 NOTE — TOC Initial Note (Signed)
Transition of Care [facility]) - Initial/Assessment Note    Patient Details  Name: Brandon [name] MRN: [id number] Date of Birth: 1945-09-28  Transition of Care [facility]) CM/SW Contact:    Dessa Phi, RN Phone Number: 03/21/2021, 9:32 AM  Clinical Narrative: Per son Stephen-patient was vaccinated but he doesn't have info, called [facility]-rep [place]-from there ST SNF-will return-they do not have the vaccine record,but she will f/u since patient has been in a private rm.                  Expected Discharge Plan: [place] Barriers to Discharge: Continued Medical Work up   Patient Goals and CMS Choice Patient states their goals for this hospitalization and ongoing recovery are:: return back to [facility]-ST SNF CMS Medicare.gov Compare Post Acute Care list provided to:: Patient Choice offered to / list presented to : Adult Children  Expected Discharge Plan and Services Expected Discharge Plan: [place]   Discharge Planning Services: CM Consult Post Acute Care Choice: [place] Living arrangements for the past 2 months: [place]                                      Prior Living Arrangements/Services Living arrangements for the past 2 months: [place] Lives with:: Facility Resident Patient language and need for interpreter reviewed:: Yes Do you feel safe going back to the place where you live?: Yes      Need for Family Participation in Patient Care: No (Comment) Care giver support system in place?: Yes (comment)   Criminal Activity/Legal Involvement Pertinent to Current Situation/Hospitalization: No - Comment as needed  Activities of Daily Living Home Assistive Devices/Equipment: Eyeglasses, Oxygen, Other (Comment) (bedbound) ADL Screening (condition at time of admission) Patient's cognitive ability adequate to safely complete daily activities?: No Is the patient deaf or have difficulty  hearing?: No Does the patient have difficulty seeing, even when wearing glasses/contacts?: No Does the patient have difficulty concentrating, remembering, or making decisions?: Yes Patient able to express need for assistance with ADLs?: Yes Does the patient have difficulty dressing or bathing?: Yes Independently performs ADLs?: No Communication: Needs assistance Is this a change from baseline?: Pre-admission baseline Dressing (OT): Needs assistance Is this a change from baseline?: Pre-admission baseline Grooming: Needs assistance Is this a change from baseline?: Pre-admission baseline Feeding: Needs assistance Is this a change from baseline?: Pre-admission baseline Bathing: Needs assistance Is this a change from baseline?: Pre-admission baseline Toileting: Needs assistance Is this a change from baseline?: Pre-admission baseline In/Out Bed: Needs assistance Is this a change from baseline?: Pre-admission baseline Walks in Home: Needs assistance, Dependent Is this a change from baseline?: Pre-admission baseline Does the patient have difficulty walking or climbing stairs?: Yes Weakness of Legs: Both Weakness of Arms/Hands: Both  Permission Sought/Granted Permission sought to share information with : Case Manager Permission granted to share information with : Yes, Verbal Permission Granted  Share Information with NAME: Case manager     Permission granted to share info w Relationship: [name] son [phone]     Emotional Assessment Appearance:: Appears stated age Attitude/Demeanor/Rapport: Gracious Affect (typically observed): Accepting Orientation: : Oriented to Self, Oriented to Place Alcohol / Substance Use: Not Applicable Psych Involvement: No (comment)  Admission diagnosis:  Cystitis [N30.90] Altered mental status, unspecified altered mental status type [id number] Acute metabolic encephalopathy [id number] Patient Active Problem List  Diagnosis Date Noted   Acute metabolic  encephalopathy [id number]   Pressure injury of skin 03/16/2021   UTI (urinary tract infection) 03/16/2021   Generalized weakness 03/15/2021   Disp supracondyl fx w/o intracondylar extension of right distal femur ([place]) 02/05/2021   Hypomagnesemia 02/04/2021   Fall at home, initial encounter 02/04/2021   Cirrhosis of liver (HCC)    Chronic heart failure with preserved ejection fraction (HFpEF) ([place]) 06/07/2020   Hyperkalemia 06/07/2020   Weakness generalized 06/07/2020   Physical deconditioning 06/06/2020   Hypothermia 05/31/2020   Bullous pemphigoid [id number]   Alcoholic cirrhosis of liver with ascites ([place])    Portal hypertensive gastropathy ([place])    Melena 10/02/2019   Angiodysplasia of stomach with hemorrhage    Gastrointestinal hemorrhage    Atrial fibrillation (HCC)    Chronic pain syndrome    Symptomatic anemia 08/13/2019   Acute upper GI bleed 08/13/2019   Anemia 07/2019   Avulsion of right patellar tendon    Closed displaced fracture of phalanx of toe of right foot 10/06/2018   Obesity BMI over 52 09/30/2018   Patella fracture, extensor mechanism disruption R knee  09/29/2018   History of colon cancer 08/23/2018   CAD (coronary artery disease) 08/23/2018   DM (diabetes mellitus), type 2 ([place]) 08/23/2018   Pyuria 08/23/2018   PCP:  Helane Rima, MD Pharmacy:   [facility] [id number] - [place], [place] - [address] AT [place] [address] [place] [address] Phone: [phone] Fax: [phone]     Social Determinants of Health (SDOH) Interventions    Readmission Risk Interventions Readmission Risk Prevention Plan 10/06/2019 10/06/2019  Transportation Screening - Complete  PCP or Specialist Appt within 3-5 Days Complete -  HRI or [place] - Complete  Social Work Consult for [place] Planning/Counseling - Complete  Palliative Care Screening - Not Applicable  Medication Review Press photographer) -  Complete

## 2021-03-21 NOTE — Progress Notes (Signed)
Orthopedic Tech Progress Note Patient Details:  Brandon [name] [birth year] [id number]  Patient ID: [name], male   DOB: 06/22/1946, 75 y.o.   MRN: [id number]  [name] 03/21/2021, 3:01 PM Overhead trapeze applied to bed per PT.

## 2021-03-21 NOTE — Evaluation (Addendum)
Physical Therapy Evaluation Patient Details Name: Brandon [name] MRN: [id number] DOB: Jul 30, 1945 Today's Date: 03/21/2021   History of Present Illness  75 y/o male presented to ED on 03/20/21 for c/o increased confusion and found to have UTI with urinary retention and metabolic encephalopathy. Pt currently at [facility] short term rehab due to fall at home on 7/10 and ORIF of R ight distal femure fracture on 02/04/21 by Dr. Doreatha Martin. At this time he is TDWB on RLE with permitted ROM to Right knee. Asking for potential update with weight bearign status and healing from ortho during this hosptial. Did also have a recent hospitalizationfor UTI on 8/19 and DC 03/19/21 and returned 03/20/21. Currnetly has a R gluteal wound as well.    PMH: Afib, cirrhosis of liver, colon cancer, CAD, diabetes, HTN, history of GI bleed, obesity, BPH  Clinical Impression  Pt admit for above reasoning and found to have increased weakness and decreased ability to mobilize. [facility] with TDWB status on R LE s/p ORIF distal femure fracture from 02/04/2021, with 2 other hospitalization admissions with other medical conditions since the fracture. Recommend ortho consult to see if pt's TDWB R LE status couldbe updated to help progress pt more during rehab. Also called ortho tech to deliver overhead trapeze to patients room for pt to assist more independently with bed mobility. Overall, pt is motivated and will benefit from more PT while here and continued at [facility] for [facility] as well. Pt's long term goal is to return back home with his mother.    ** Just received verbal order from Patrecia Pace, [place] for ortho that pt can now be WBAT , however if he has significant increased pain with weight bearing to then pull him back down to TDWB until seen next in the ortho clinic 04/09/2021.    Follow Up Recommendations SNF    Equipment Recommendations  None recommended by PT    Recommendations for Other Services        Precautions / Restrictions Precautions Precautions: Fall (fall at home 02/03/21 resulting in femur fracture, and pt stated he had been falling prior to this as well) Precaution Comments: recent R knee surgery (TDWB) asking for ortho to consult to reassess healign and WB update if possible Restrictions Weight Bearing Restrictions: Yes RLE Weight Bearing: Touchdown weight bearing. 03/21/2021 15:00 update to WBAT unless pt has significant increased pain with WBAT, if he does then pull back down to TDWB until pt is seen next in clinic.       Mobility  Bed Mobility Overal bed mobility: Needs Assistance Bed Mobility: Rolling;Supine to Sit;Sit to Supine Rolling: Mod assist   Supine to sit: Mod assist Sit to supine: Mod assist   General bed mobility comments: cues for reaching and  cues using UEs and using LEs to assist with the roll. had 2 perso for supine to sit and sit to supine, due to pt originally fearful, but really only required 1 person assist. Sat edge of bed for about 5 minutes or more , independent with balance even with reaching outside of base of support.    Transfers Overall transfer level: Needs assistance   Transfers: Lateral/Scoot Transfers          Lateral/Scoot Transfers: Max assist;+2 physical assistance General transfer comment: attempted lateral scoot to Right with drop arm recliner however with several attempts, and pt really trying, scooting was very difficulty with not much lateral progression. So then returned to supine so  a hoyer lift with MaxiMove was perfromed for OOB. Pt tolerated well and assist with rolling for pad placement.  Ambulation/Gait                Stairs            Wheelchair Mobility    Modified Rankin (Stroke Patients Only)       Balance Overall balance assessment: Needs assistance Sitting-balance support: No upper extremity supported;Feet supported Sitting balance-Leahy Scale: Good     Standing balance support:   (unable to performed standing)                                 Pertinent Vitals/Pain Pain Assessment: Faces Faces Pain Scale: Hurts a little bit (pt states it doens't hurt much more that his arthiritis has always hurt. No facial or grimaces during session.) Pain Location: pt states slight painin back upon sitting and R knee with some movment, but did not mention anymore suring the session. Pain Descriptors / Indicators: Aching Pain Intervention(s): Monitored during session    Home Living Family/patient expects to be discharged to:: Skilled nursing facility                      Prior Function Level of Independence: Needs assistance   Gait / Transfers Assistance Needed: primarily bed bound at SNF due to inability to maintain TDWB in standing     Comments: prior to fall on 02/03/21 pt was independent in his apartment. Live with his mother who unfortunaely fell about 1 month prior to his fall and is in [facility] as well. He used a RW at all times for past several years, hasn't been getting out as much since COVID, meds are delivered, doesn't drive, and sponge baths at times. currently at [facility] they have been limited with mobility due to TDWB, pt states they have not been getting him out of bed.     Hand Dominance   Dominant Hand: Right    Extremity/Trunk Assessment        Lower Extremity Assessment Lower Extremity Assessment: Generalized weakness;RLE deficits/detail RLE Deficits / Details: unable to perform SLR independently but has general ROM and strength. Both LEs are in ER rotation/ abduction and some diffulcties with full extenion and flexion due to general arthiritis and pt habitus       Communication   Communication: No difficulties  Cognition Arousal/Alertness: Awake/alert Behavior During Therapy: WFL for tasks assessed/performed Overall Cognitive Status: Within Functional Limits for tasks assessed                                  General Comments: Pt has some difficulty with specific details or timeline , but did recall most events that has led up to this admission.      General Comments      Exercises     Assessment/Plan    PT Assessment Patient needs continued PT services  PT Problem List Decreased range of motion;Decreased strength;Decreased activity tolerance;Decreased balance;Decreased mobility;Decreased knowledge of precautions;Obesity       PT Treatment Interventions DME instruction;Functional mobility training;Therapeutic activities;Therapeutic exercise;Balance training;Patient/family education    PT Goals (Current goals can be found in the Care Plan section)  Acute Rehab PT Goals Patient Stated Goal: My mom and I wish to get back to walking so we can both go home to our  apartment PT Goal Formulation: With patient Time For Goal Achievement: 04/04/21 Potential to Achieve Goals: Fair    Frequency Min 2X/week   Barriers to discharge        Co-evaluation               AM-PAC PT "6 Clicks" Mobility  Outcome Measure Help needed turning from your back to your side while in a flat bed without using bedrails?: A Lot Help needed moving from lying on your back to sitting on the side of a flat bed without using bedrails?: A Lot Help needed moving to and from a bed to a chair (including a wheelchair)?: Total Help needed standing up from a chair using your arms (e.g., wheelchair or bedside chair)?: Total Help needed to walk in hospital room?: Total Help needed climbing 3-5 steps with a railing? : Total 6 Click Score: 8    End of Session Equipment Utilized During Treatment: Other (comment) (Maximove) Activity Tolerance: Patient tolerated treatment well Patient left: in chair;with call bell/phone within reach;with chair alarm set Nurse Communication: Mobility status;Need for lift equipment PT Visit Diagnosis: Unsteadiness on feet (R26.81);Other abnormalities of gait and mobility (R26.89);Muscle  weakness (generalized) (M62.81);History of falling (Z91.81)    Time: [id number] PT Time Calculation (min) (ACUTE ONLY): 43 min   Charges:   PT Evaluation $PT Eval Moderate Complexity: 1 Mod PT Treatments $Therapeutic Exercise: 23-37 mins        Kohler Pellerito, PT, MPT Acute Rehabilitation Services Office: [phone] Pager: [phone] 03/21/2021   Clide Dales 03/21/2021, 1:27 PM

## 2021-03-22 DIAGNOSIS — I5032 Chronic diastolic (congestive) heart failure: Secondary | ICD-10-CM

## 2021-03-22 DIAGNOSIS — Z515 Encounter for palliative care: Secondary | ICD-10-CM

## 2021-03-22 DIAGNOSIS — Z7189 Other specified counseling: Secondary | ICD-10-CM

## 2021-03-22 LAB — URINE CULTURE: Culture: NO GROWTH

## 2021-03-22 LAB — BASIC METABOLIC PANEL
Anion gap: 7 (ref 5–15)
BUN: 18 mg/dL (ref 8–23)
CO2: 25 mmol/L (ref 22–32)
Calcium: 9.3 mg/dL (ref 8.9–10.3)
Chloride: 102 mmol/L (ref 98–111)
Creatinine, Ser: 0.95 mg/dL (ref 0.61–1.24)
GFR, Estimated: >60 mL/min (ref >60)
Glucose, Bld: 116 mg/dL — ABNORMAL HIGH (ref 70–99)
Potassium: 3.7 mmol/L (ref 3.5–5.1)
Sodium: 134 mmol/L — ABNORMAL LOW (ref 135–145)

## 2021-03-22 LAB — CBC
HCT: 39 % (ref 39.0–52.0)
Hemoglobin: 12 g/dL — ABNORMAL LOW (ref 13.0–17.0)
MCH: 28.2 pg (ref 26.0–34.0)
MCHC: 30.8 g/dL (ref 30.0–36.0)
MCV: 91.5 fL (ref 80.0–100.0)
Platelets: 137 10*3/uL — ABNORMAL LOW (ref 150–400)
RBC: 4.26 MIL/uL (ref 4.22–5.81)
RDW: 14.3 % (ref 11.5–15.5)
WBC: 6.4 10*3/uL (ref 4.0–10.5)
nRBC: 0 % (ref 0.0–0.2)

## 2021-03-22 LAB — GLUCOSE, CAPILLARY
Glucose-Capillary: 129 mg/dL — ABNORMAL HIGH (ref 70–99)
Glucose-Capillary: 136 mg/dL — ABNORMAL HIGH (ref 70–99)
Glucose-Capillary: 189 mg/dL — ABNORMAL HIGH (ref 70–99)
Glucose-Capillary: 104 mg/dL — ABNORMAL HIGH (ref 70–99)

## 2021-03-22 MED ORDER — TAMSULOSIN HCL 0.4 MG PO CAPS
0.8000 mg | ORAL_CAPSULE | Freq: Every day | ORAL | Status: DC
  Administered 2021-03-22 – 2021-03-23 (×2): 0.8 mg via ORAL
  Filled 2021-03-22 (×2): qty 2

## 2021-03-22 MED ORDER — SULFAMETHOXAZOLE-TRIMETHOPRIM 800-160 MG PO TABS
1.0000 | ORAL_TABLET | Freq: Two times a day (BID) | ORAL | Status: DC
  Administered 2021-03-22 – 2021-03-24 (×5): 1 via ORAL
  Filled 2021-03-22 (×5): qty 1

## 2021-03-22 NOTE — Plan of Care (Signed)

## 2021-03-22 NOTE — Progress Notes (Signed)
Patient ID: Brandon [name], male   DOB: 04-08-1946, 75 y.o.   MRN: [id number]     Subjective: Pt feeling much improved.  Objective: Vital signs in last 24 hours: Temp:  [97.7 F (36.5 C)-98.6 F (37 C)] 98.1 F (36.7 C) (08/26 0453) Pulse Rate:  [60-85] 85 (08/26 0453) Resp:  [16-20] 20 (08/26 0453) BP: (127-139)/(69-89) 139/89 (08/26 0453) SpO2:  [97 %-100 %] 98 % (08/26 0453) Weight:  [134.4 kg-136.1 kg] 134.4 kg (08/26 0300)  Intake/Output from previous day: 08/25 0701 - 08/26 0700 In: 953.6 [P.O.:840; IV Piggyback:113.6] Out: 2025 [Urine:2025] Intake/Output this shift: No intake/output data recorded.  Physical Exam:  General: Alert and oriented GU: Urine draining clear from urethral catheter  Lab Results: Recent Labs    03/20/21 1305 03/22/21 0602  HGB 12.6* 12.0*  HCT 42.0 39.0   BMET Recent Labs    03/20/21 1305 03/22/21 0602  NA 140 134*  K 4.2 3.7  CL 106 102  CO2 26 25  GLUCOSE 103* 116*  BUN 18 18  CREATININE 1.01 0.95  CALCIUM 9.9 9.3     Studies/Results: Culture pending  Assessment/Plan: 1 Complex UTI/urinary retention: Brandon [name] is improving with bladder drainage and antibiotic therapy.  Change to oral antibiotics once final cultures are back.  Would consider treatment for 10-14 days considering complexity of UTI.  Would increase tamsulosin to 0.8 mg and continue finasteride 5 mg.  Keep urethral catheter indwelling upon discharge.  Will arrange outpatient voiding trial after completion of acute infection.  Please call if further questions.   LOS: 2 days   Brandon [name] 03/22/2021, 7:18 AM

## 2021-03-22 NOTE — Care Management Important Message (Signed)
Medicare IM printed for [facility] to give to the patient. 

## 2021-03-22 NOTE — Progress Notes (Signed)
Physical Therapy Treatment Patient Details Name: Brandon [name] MRN: [id number] DOB: [birth year] Today's Date: 03/22/2021    History of Present Illness 75 y/o male presented to ED on 03/20/21 for c/o increased confusion and found to have UTI with urinary retention and metabolic encephalopathy. Pt currently at [facility] short term rehab due to fall at home on 7/10 and ORIF of R ight distal femure fracture on 02/04/21 by Dr. Doreatha Martin. At this time he is TDWB on RLE with permitted ROM to Right knee. Asking for potential update with weight bearign status and healing from ortho during this hosptial. Did also have a recent hospitalizationfor UTI on 8/19 and DC 03/19/21 and returned 03/20/21. Currnetly has a R gluteal wound as well.    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   Pt admitted from [facility]  with Dx Acute metabolic encephalopathy is thought to be secondary to urinary retention and UTI General bed mobility comments: Mod assist for hand hold to pull up on, guiding LEs, and MAX assist with pivoting of hips using bed pad.  Pt tends to use momentum. General transfer comment: pt back in bed via [place] so attempted sit to stand using STEDY.  Elevated bed.  Attempted three times however pt unable to clear enough hips off bed to fullt achieve.  Pt c/o increased R knee pain with PWB and quickly aborted.  Pt reports he has not stood since his surgery in July.  Pt required MAX assist to complete scooting to back of bed using bed pads and momentum. Assisted back to Supine with use from [place] and bed pads to scoot to [facility]. Pt plans to return to [place] to complete his Rehab.   Follow Up  Recommendations  SNF     Equipment Recommendations  None recommended by PT    Recommendations for Other Services       Precautions / Restrictions Precautions Precautions: Fall Precaution Comments: February 04, 2021 R ORIF distal Femur was TTWB x 6 weeks (non amb) at [facility]  just updatded to [facility] if minimal pain otherwise remain TTWB until next Ortho visit 04/09/2021 Restrictions Weight Bearing Restrictions: Yes RLE Weight Bearing: Weight bearing as tolerated    Mobility  Bed Mobility Overal bed mobility: Needs Assistance Bed Mobility: Supine to Sit;Sit to Supine     Supine to sit: Mod assist Sit to supine: Mod assist   General bed mobility comments: Mod assist for hand hold to pull up on, guiding LEs, and MAX assist with pivoting of hips using bed pad.  Pt tends to use momentum.    Transfers Overall transfer level: Needs assistance Equipment used: Bilateral platform walker Transfers: Lateral/Scoot Transfers          Lateral/Scoot Transfers: Max assist;+2 physical assistance General transfer comment: pt back in bed via [place] so attempted sit to stand using STEDY.  Elevated bed.  Attempted three times however pt unable to clear enough hips off bed to fullt achieve.  Pt c/o increased R knee pain with PWB and quickly aborted.  Pt reports he has not stood since his surgery in July.  Pt required MAX assist to complete  scooting to back of bed using bed pads and momentum.  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, Art gallery manager and motivated      Exercises      General Comments        Pertinent Vitals/Pain Pain Assessment: Faces Faces Pain Scale: Hurts whole lot Pain Location: R lmee with attempted partial standing Pain Descriptors / Indicators: Grimacing Pain Intervention(s): Monitored during session;Repositioned    Home Living Family/patient expects to be discharged to:: Skilled nursing facility                    Prior Function Level of Independence: Needs assistance  Gait / Transfers Assistance Needed: primarily bed bound at SNF due to inability to maintain TDWB in standing   Comments: prior to fall on 02/03/21 pt was independent in his apartment. Live with his mother who unfortunaely fell about 1 month prior to his fall and is in [facility] as well. He used a RW at all times for past several years, hasn't been getting out as much since COVID, meds are delivered, doesn't drive, and sponge baths at times. currently at [facility] they have been limited with mobility due to TDWB, pt states they have not been getting him out of bed.   PT Goals (current goals can now be found in the care plan section) Acute Rehab PT Goals Patient Stated Goal: My mom and I wish to get back to walking so we can both go home to our apartment Progress towards PT goals: Progressing toward goals    Frequency    Min 2X/week      PT Plan Current plan remains appropriate    Co-evaluation              AM-PAC PT "6 Clicks" Mobility   Outcome Measure  Help needed turning from your back to your side while in a flat bed without using bedrails?: A Lot Help needed moving from lying on your back to sitting on the side of a flat bed without using bedrails?: A Lot Help needed moving to and from a bed to a chair (including a wheelchair)?: Total Help needed standing up from  a chair using your arms (e.g., wheelchair or bedside chair)?: Total Help needed to walk in hospital room?: Total Help needed climbing 3-5 steps with a railing? : Total 6 Click Score: 8    End of Session Equipment Utilized During Treatment: Gait belt Activity Tolerance: Patient limited by fatigue;Patient limited by pain Patient left: in bed;with call bell/phone within reach;with nursing/sitter in room Nurse Communication: Mobility status;Need for  lift equipment PT Visit Diagnosis: Unsteadiness on feet (R26.81);Other abnormalities of gait and mobility (R26.89);Muscle weakness (generalized) (M62.81);History of falling (Z91.81)     Time: [id number] PT Time Calculation (min) (ACUTE ONLY): 26 min  Charges:  $Therapeutic Activity: 23-37 mins                     {Ricard Faulkner  PTA Acute  Rehabilitation Services Pager      [phone] Office      [phone]

## 2021-03-22 NOTE — Progress Notes (Addendum)
PROGRESS NOTE    Brandon [name]  [id number] DOB: [birth year] DOA: 03/20/2021 PCP: Helane Rima, MD     Brief Narrative:  Brandon [name] is a 75 year old male with a history of A. fib, liver cirrhosis, colon CA, CAD, diabetes, obesity, BPH, right knee surgery 4 weeks ago on therapeutic Lovenox, recent admission 8/19-8/23, was just discharged 8/23.  Patient was treated for UTI, acute kidney injury, elevated ammonia and was discharged on oral ciprofloxacin.  Patient was sent back from the facility, staff reported "patient was more confused than usual". In ED, on examination patient is alert and oriented to self and place, but not able to give a good history.  He denies any fevers or chills, states he has diffuse "aches and pains everywhere", no coughing.  For other questions he states "I don't know". Patient was noted to be retaining urine in ED and Foley catheter was placed with removal of 1.8 L of milky chocolate colored urine/pyuria. CT abdomen showed cystitis, difficult to exclude mass in the setting recommended cystoscopy, liver cirrhosis without discrete mass, cholelithiasis.  Urology was consulted.  New events last 24 hours / Subjective: Patient sitting in a recliner today.  He is alert and oriented to self, place and year.  Mentation seems to be greatly improved since admission.  Assessment & Plan:   Principal Problem:   Acute metabolic encephalopathy Active Problems:   CAD (coronary artery disease)   DM (diabetes mellitus), type 2 (HCC)   Obesity BMI over 52   Atrial fibrillation (HCC)   Chronic heart failure with preserved ejection fraction (HFpEF) (HCC)   Cirrhosis of liver (HCC)   Pressure injury of skin   UTI (urinary tract infection)   Acute metabolic encephalopathy is thought to be secondary to urinary retention and UTI -CT head: no acute intracranial process -Improved, likely back to his baseline  UTI with urinary retention -Foley catheter was placed at time of  admission -CT A/P: Thickened irregular bladder wall with minimal adjacent infiltrative changes, pattern suggesting cystitis and probably a degree of wall trabeculation. Difficult to exclude mass in this setting, recommend correlation with cystoscopy. -Started on IV cefepime --> Urine culture 8/24 negative but urine culture from 8/19 showed enterobacter. He did not seem to tolerate cipro, will deescalate to bactrim today to treat total 2 week course (last day 8/1)  -Continue proscar and flomax  -Urology consulted, recommended to keep catheter in place, re-eval as outpatient   Diabetes mellitus type 2 -Continue sliding scale insulin  Chronic A. fib -Continue Coreg -Patient is not on long-term anticoagulation due to prior history of GI bleed and liver cirrhosis however he has been on DVT prophylaxis due to right femoral fracture in 0000000  Chronic diastolic heart failure -Continue Lasix, spironolactone   HLD -Continue pravachol   Cirrhosis -Continue lactulose -Ammonia level 29  Recent distal right femoral fracture July 2022 -PT OT -Has outpatient orthopedic surgery follow-up 9/13    In agreement with assessment of the pressure ulcer as below:  Pressure Injury 03/20/21 Buttocks Right Stage 2 -  Partial thickness loss of dermis presenting as a shallow open injury with a red, pink wound bed without slough. (Active)  03/20/21 2305  Location: Buttocks  Location Orientation: Right  Staging: Stage 2 -  Partial thickness loss of dermis presenting as a shallow open injury with a red, pink wound bed without slough.  Wound Description (Comments):   Present on Admission: Yes         DVT prophylaxis:  enoxaparin (  LOVENOX) injection 70 mg Start: 03/20/21 2245  Code Status:     Code Status Orders  (From admission, onward)           Start     Ordered   03/21/21 0712  Do not attempt resuscitation (DNR)  Continuous       Question Answer Comment  In the event of cardiac or  respiratory ARREST Do not call a "code blue"   In the event of cardiac or respiratory ARREST Do not perform Intubation, CPR, defibrillation or ACLS   In the event of cardiac or respiratory ARREST Use medication by any route, position, wound care, and other measures to relive pain and suffering. May use oxygen, suction and manual treatment of airway obstruction as needed for comfort.      03/21/21 0711           Code Status History     Date Active Date Inactive Code Status Order ID Comments User Context   03/20/2021 2201 03/21/2021 0711 Full Code [id number]  Mendel Corning, MD ED   03/20/2021 1950 03/20/2021 2201 DNR [id number]  Chotiner, Yevonne Aline, MD ED   03/15/2021 1723 03/19/2021 1934 DNR [id number]  Shawna Clamp, MD ED   03/15/2021 1723 03/15/2021 1723 Full Code [id number]  Shawna Clamp, MD ED   03/15/2021 1722 03/15/2021 1723 Full Code [id number]  Shawna Clamp, MD ED   02/03/2021 2247 02/12/2021 0206 DNR [id number]  Rhetta Mura, DO ED   06/06/2020 0652 06/08/2020 1854 DNR [id number]  Jean Rosenthal, MD ED   05/25/2020 1829 06/04/2020 2011 DNR [id number]  Madalyn Rob, MD ED   10/02/2019 2126 10/06/2019 2143 DNR [id number]  Bennie Pierini, MD Inpatient   09/16/2019 0831 09/19/2019 2047 DNR [id number]  Kathi Ludwig, MD ED   08/13/2019 1922 08/16/2019 1729 DNR [id number]  Bonnita Hollow, MD ED   08/13/2019 1922 08/13/2019 1922 Partial Code [id number]  Bonnita Hollow, MD ED   08/13/2019 1922 08/13/2019 1922 DNR [id number]  Bonnita Hollow, MD ED   09/29/2018 2250 10/08/2018 2301 DNR [id number]  Benay Pike, MD ED   08/23/2018 1929 08/25/2018 2043 DNR [id number]  Toy Baker, MD Inpatient      Family Communication: No family at bedside, updated son over the phone today Disposition Plan:  Status is: Inpatient  Remains inpatient appropriate because:Inpatient level of care appropriate due to severity of illness  Dispo: The patient is from: SNF              Anticipated d/c is to:  SNF              Patient currently is not medically stable to d/c.   Difficult to place patient No      Consultants:  Urology  Palliative care  Procedures:  None   Antimicrobials:  Anti-infectives (From admission, onward)    Start     Dose/Rate Route Frequency Ordered Stop   03/20/21 1830  ceFEPIme (MAXIPIME) 2 g in sodium chloride 0.9 % 100 mL IVPB        2 g 200 mL/hr over 30 Minutes Intravenous Every 12 hours 03/20/21 1806          Objective: Vitals:   03/21/21 1303 03/21/21 2055 03/22/21 0300 03/22/21 0453  BP:  127/87  139/89  Pulse:  60  85  Resp:  20  20  Temp:  97.7 F (36.5 C)  98.1 F (36.7 C)  TempSrc:  Oral  Oral  SpO2: 100% 98%  98%  Weight:   134.4 kg   Height:        Intake/Output Summary (Last 24 hours) at 03/22/2021 1223 Last data filed at 03/22/2021 0456 Gross per 24 hour  Intake 713.59 ml  Output 2025 ml  Net -1311.41 ml    Filed Weights   03/21/21 1236 03/22/21 0300  Weight: 136.1 kg 134.4 kg    Examination: General exam: Appears calm and comfortable  Respiratory system: Clear to auscultation. Respiratory effort normal. Cardiovascular system: S1 & S2 heard, RRR. No pedal edema. Gastrointestinal system: Abdomen is nondistended, soft and nontender. Normal bowel sounds heard. Central nervous system: Alert and oriented. Non focal exam. Speech clear  Extremities: Symmetric in appearance bilaterally  Psychiatry: Judgement and insight appear stable. Mood & affect appropriate.    Data Reviewed: I have personally reviewed following labs and imaging studies  CBC: Recent Labs  Lab 03/15/21 1521 03/15/21 1620 03/16/21 0300 03/17/21 0256 03/18/21 0304 03/20/21 1305 03/22/21 0602  WBC 10.3  --  7.9 6.6 6.6 6.8 6.4  NEUTROABS 7.8*  --   --   --   --   --   --   HGB 13.4   < > 13.0 11.8* 11.4* 12.6* 12.0*  HCT 45.0   < > 42.8 40.8 37.7* 42.0 39.0  MCV 94.3  --  92.0 98.1 92.4 94.8 91.5  PLT 231  --  231 185 185 169 137*   < > =  values in this interval not displayed.    Basic Metabolic Panel: Recent Labs  Lab 03/16/21 0300 03/17/21 0256 03/18/21 0304 03/19/21 0159 03/20/21 1305 03/22/21 0602  NA 136 135 135 133* 140 134*  K 4.2 3.9 3.7 4.6 4.2 3.7  CL 99 103 103 101 106 102  CO2 '25 23 24 25 26 25  '$ GLUCOSE 137* 117* 100* 106* 103* 116*  BUN 45* 37* '23 19 18 18  '$ CREATININE 1.42* 1.24 1.10 1.08 1.01 0.95  CALCIUM 9.9 9.3 9.3 9.2 9.9 9.3  MG 1.7  --   --   --   --   --   PHOS 3.0  --   --   --   --   --     GFR: Estimated Creatinine Clearance: 88.8 mL/min (by C-G formula based on SCr of 0.95 mg/dL). Liver Function Tests: Recent Labs  Lab 03/15/21 1521 03/16/21 0300 03/20/21 1305  AST 12* 15 12*  ALT '8 8 9  '$ ALKPHOS 104 103 94  BILITOT 0.8 0.7 0.5  PROT 6.5 6.4* 6.6  ALBUMIN 2.7* 2.6* 2.9*    No results for input(s): LIPASE, AMYLASE in the last 168 hours. Recent Labs  Lab 03/15/21 1523 03/16/21 0300 03/17/21 0256 03/20/21 1305  AMMONIA 97* 101* 63* 29    Coagulation Profile: Recent Labs  Lab 03/15/21 1521 03/20/21 1305  INR 1.2 1.1    Cardiac Enzymes: No results for input(s): CKTOTAL, CKMB, CKMBINDEX, TROPONINI in the last 168 hours. BNP (last 3 results) No results for input(s): PROBNP in the last 8760 hours. HbA1C: No results for input(s): HGBA1C in the last 72 hours. CBG: Recent Labs  Lab 03/21/21 1118 03/21/21 1552 03/21/21 2055 03/22/21 0754 03/22/21 1210  GLUCAP 101* 191* 121* 129* 136*    Lipid Profile: No results for input(s): CHOL, HDL, LDLCALC, TRIG, CHOLHDL, LDLDIRECT in the last 72 hours. Thyroid Function Tests: No results for input(s): TSH, T4TOTAL, FREET4, T3FREE, THYROIDAB in the last 72 hours. Anemia Panel: No results for input(s): VITAMINB12, FOLATE, FERRITIN,  TIBC, IRON, RETICCTPCT in the last 72 hours. Sepsis Labs: Recent Labs  Lab 03/15/21 1521 03/15/21 1721 03/20/21 2313  PROCALCITON  --   --  <0.10  LATICACIDVEN 2.2* 2.0*  --       Recent Results (from the past 240 hour(s))  Urine Culture     Status: Abnormal   Collection Time: 03/15/21  3:21 PM   Specimen: In/Out Cath Urine  Result Value Ref Range Status   Specimen Description IN/OUT CATH URINE  Final   Special Requests   Final    NONE Performed at [facility], [address], [place], [place] [address]    Culture >=100,000 COLONIES/mL ENTEROBACTER CLOACAE (A)  Final   Report Status 03/18/2021 FINAL  Final   Organism ID, Bacteria ENTEROBACTER CLOACAE (A)  Final      Susceptibility   Enterobacter cloacae - MIC*    CEFAZOLIN >=64 RESISTANT Resistant     CEFEPIME <=0.12 SENSITIVE Sensitive     CIPROFLOXACIN <=0.25 SENSITIVE Sensitive     GENTAMICIN <=1 SENSITIVE Sensitive     IMIPENEM 0.5 SENSITIVE Sensitive     NITROFURANTOIN 32 SENSITIVE Sensitive     TRIMETH/SULFA <=20 SENSITIVE Sensitive     PIP/TAZO <=4 SENSITIVE Sensitive     * >=100,000 COLONIES/mL ENTEROBACTER CLOACAE  Blood Culture (routine x 2)     Status: None   Collection Time: 03/15/21  3:35 PM   Specimen: BLOOD RIGHT HAND  Result Value Ref Range Status   Specimen Description BLOOD RIGHT HAND  Final   Special Requests   Final    BOTTLES DRAWN AEROBIC AND ANAEROBIC Blood Culture results may not be optimal due to an inadequate volume of blood received in culture bottles   Culture   Final    NO GROWTH 5 DAYS Performed at [facility], [place] [address] [place], [place] [address]    Report Status 03/20/2021 FINAL  Final  Blood Culture (routine x 2)     Status: None   Collection Time: 03/15/21  3:35 PM   Specimen: BLOOD LEFT HAND  Result Value Ref Range Status   Specimen Description BLOOD LEFT HAND  Final   Special Requests   Final    BOTTLES DRAWN AEROBIC AND ANAEROBIC Blood Culture results may not be optimal due to an inadequate volume of blood received in culture bottles   Culture   Final    NO GROWTH 5 DAYS Performed at [facility], [place] [address]  [place], [place] [address]    Report Status 03/20/2021 FINAL  Final  Resp Panel by RT-PCR (Flu A&B, Covid) Nasopharyngeal Swab     Status: None   Collection Time: 03/15/21  7:30 PM   Specimen: Nasopharyngeal Swab; Nasopharyngeal(NP) swabs in vial transport medium  Result Value Ref Range Status   SARS Coronavirus 2 by RT PCR NEGATIVE NEGATIVE Final    Comment: (NOTE) SARS-CoV-2 target nucleic acids are NOT DETECTED.  The SARS-CoV-2 RNA is generally detectable in upper respiratory specimens during the acute phase of infection. The lowest concentration of SARS-CoV-2 viral copies this assay can detect is 138 copies/mL. A negative result does not preclude SARS-Cov-2 infection and should not be used as the sole basis for treatment or other patient management decisions. A negative result may occur with  improper specimen collection/handling, submission of specimen other than nasopharyngeal swab, presence of viral mutation(s) within the areas targeted by this assay, and inadequate number of viral copies(<138 copies/mL). A negative result must  be combined with clinical observations, patient history, and epidemiological information. The expected result is Negative.  Fact Sheet for Patients:  EntrepreneurPulse.com.au  Fact Sheet for Healthcare Providers:  IncredibleEmployment.be  This test is no t yet approved or cleared by the [place] FDA and  has been authorized for detection and/or diagnosis of SARS-CoV-2 by FDA under an Emergency Use Authorization (EUA). This EUA will remain  in effect (meaning this test can be used) for the duration of the COVID-19 declaration under Section 564(b)(1) of the Act, 21 U.S.C.section 360bbb-3(b)(1), unless the authorization is terminated  or revoked sooner.       Influenza A by PCR NEGATIVE NEGATIVE Final   Influenza B by PCR NEGATIVE NEGATIVE Final    Comment: (NOTE) The Xpert Xpress SARS-CoV-2/FLU/RSV plus assay is  intended as an aid in the diagnosis of influenza from Nasopharyngeal swab specimens and should not be used as a sole basis for treatment. Nasal washings and aspirates are unacceptable for Xpert Xpress SARS-CoV-2/FLU/RSV testing.  Fact Sheet for Patients: EntrepreneurPulse.com.au  Fact Sheet for Healthcare Providers: IncredibleEmployment.be  This test is not yet approved or cleared by the [place] FDA and has been authorized for detection and/or diagnosis of SARS-CoV-2 by FDA under an Emergency Use Authorization (EUA). This EUA will remain in effect (meaning this test can be used) for the duration of the COVID-19 declaration under Section 564(b)(1) of the Act, 21 U.S.C. section 360bbb-3(b)(1), unless the authorization is terminated or revoked.  Performed at [facility], [place] [address] [place], [place] [address]   Resp Panel by RT-PCR (Flu A&B, Covid) Nasopharyngeal Swab     Status: None   Collection Time: 03/18/21  4:14 PM   Specimen: Nasopharyngeal Swab; Nasopharyngeal(NP) swabs in vial transport medium  Result Value Ref Range Status   SARS Coronavirus 2 by RT PCR NEGATIVE NEGATIVE Final    Comment: (NOTE) SARS-CoV-2 target nucleic acids are NOT DETECTED.  The SARS-CoV-2 RNA is generally detectable in upper respiratory specimens during the acute phase of infection. The lowest concentration of SARS-CoV-2 viral copies this assay can detect is 138 copies/mL. A negative result does not preclude SARS-Cov-2 infection and should not be used as the sole basis for treatment or other patient management decisions. A negative result may occur with  improper specimen collection/handling, submission of specimen other than nasopharyngeal swab, presence of viral mutation(s) within the areas targeted by this assay, and inadequate number of viral copies(<138 copies/mL). A negative result must be combined with clinical observations, patient  history, and epidemiological information. The expected result is Negative.  Fact Sheet for Patients:  EntrepreneurPulse.com.au  Fact Sheet for Healthcare Providers:  IncredibleEmployment.be  This test is no t yet approved or cleared by the [place] FDA and  has been authorized for detection and/or diagnosis of SARS-CoV-2 by FDA under an Emergency Use Authorization (EUA). This EUA will remain  in effect (meaning this test can be used) for the duration of the COVID-19 declaration under Section 564(b)(1) of the Act, 21 U.S.C.section 360bbb-3(b)(1), unless the authorization is terminated  or revoked sooner.       Influenza A by PCR NEGATIVE NEGATIVE Final   Influenza B by PCR NEGATIVE NEGATIVE Final    Comment: (NOTE) The Xpert Xpress SARS-CoV-2/FLU/RSV plus assay is intended as an aid in the diagnosis of influenza from Nasopharyngeal swab specimens and should not be used as a sole basis for treatment. Nasal washings and aspirates are unacceptable for Xpert Xpress SARS-CoV-2/FLU/RSV testing.  Fact  Sheet for Patients: EntrepreneurPulse.com.au  Fact Sheet for Healthcare Providers: IncredibleEmployment.be  This test is not yet approved or cleared by the [place] FDA and has been authorized for detection and/or diagnosis of SARS-CoV-2 by FDA under an Emergency Use Authorization (EUA). This EUA will remain in effect (meaning this test can be used) for the duration of the COVID-19 declaration under Section 564(b)(1) of the Act, 21 U.S.C. section 360bbb-3(b)(1), unless the authorization is terminated or revoked.  Performed at [facility], [place] [address] [place], [place] [address]   Urine Culture     Status: None   Collection Time: 03/20/21 12:55 PM   Specimen: Urine, Clean Catch  Result Value Ref Range Status   Specimen Description   Final    URINE, CLEAN CATCH Performed at [facility], [place] [address] [place], [place] [address]    Special Requests   Final    NONE Performed at [facility], [place] [address] [place], [place] [address]    Culture   Final    NO GROWTH Performed at [facility], [place] [address], [place], [place] [address]    Report Status 03/22/2021 FINAL  Final  Resp Panel by RT-PCR (Flu A&B, Covid) Nasopharyngeal Swab     Status: None   Collection Time: 03/20/21  1:05 PM   Specimen: Nasopharyngeal Swab; Nasopharyngeal(NP) swabs in vial transport medium  Result Value Ref Range Status   SARS Coronavirus 2 by RT PCR NEGATIVE NEGATIVE Final    Comment: (NOTE) SARS-CoV-2 target nucleic acids are NOT DETECTED.  The SARS-CoV-2 RNA is generally detectable in upper respiratory specimens during the acute phase of infection. The lowest concentration of SARS-CoV-2 viral copies this assay can detect is 138 copies/mL. A negative result does not preclude SARS-Cov-2 infection and should not be used as the sole basis for treatment or other patient management decisions. A negative result may occur with  improper specimen collection/handling, submission of specimen other than nasopharyngeal swab, presence of viral mutation(s) within the areas targeted by this assay, and inadequate number of viral copies(<138 copies/mL). A negative result must be combined with clinical observations, patient history, and epidemiological information. The expected result is Negative.  Fact Sheet for Patients:  EntrepreneurPulse.com.au  Fact Sheet for Healthcare Providers:  IncredibleEmployment.be  This test is no t yet approved or cleared by the [place] FDA and  has been authorized for detection and/or diagnosis of SARS-CoV-2 by FDA under an Emergency Use Authorization (EUA). This EUA will remain  in effect (meaning this test can be used) for the duration of the COVID-19 declaration under Section  564(b)(1) of the Act, 21 U.S.C.section 360bbb-3(b)(1), unless the authorization is terminated  or revoked sooner.       Influenza A by PCR NEGATIVE NEGATIVE Final   Influenza B by PCR NEGATIVE NEGATIVE Final    Comment: (NOTE) The Xpert Xpress SARS-CoV-2/FLU/RSV plus assay is intended as an aid in the diagnosis of influenza from Nasopharyngeal swab specimens and should not be used as a sole basis for treatment. Nasal washings and aspirates are unacceptable for Xpert Xpress SARS-CoV-2/FLU/RSV testing.  Fact Sheet for Patients: EntrepreneurPulse.com.au  Fact Sheet for Healthcare Providers: IncredibleEmployment.be  This test is not yet approved or cleared by the [place] FDA and has been authorized for detection and/or diagnosis of SARS-CoV-2 by FDA under an Emergency Use Authorization (EUA). This EUA will remain in effect (meaning this test can be used) for the duration of the  COVID-19 declaration under Section 564(b)(1) of the Act, 21 U.S.C. section 360bbb-3(b)(1), unless the authorization is terminated or revoked.  Performed at [facility], [place] [address] [place], [place] [address]        Radiology Studies: CT HEAD WO CONTRAST (5MM)  Result Date: 03/20/2021 CLINICAL DATA:  Delirium, history of urinary tract infection, chest pain EXAM: CT HEAD WITHOUT CONTRAST TECHNIQUE: Contiguous axial images were obtained from the base of the skull through the vertex without intravenous contrast. COMPARISON:  02/03/2021 FINDINGS: Brain: No acute infarct or hemorrhage. Lateral ventricles and midline structures are unremarkable. No acute extra-axial fluid collections. No mass effect. Vascular: Stable atherosclerosis.  No hyperdense vessel. Skull: Normal. Negative for fracture or focal lesion. Sinuses/Orbits: Stable opacification of the left maxillary sinus. Remaining paranasal sinuses are clear. Other: None. IMPRESSION: 1. Stable head  CT, no acute intracranial process. Electronically Signed   By: Randa Ngo M.D.   On: 03/20/2021 16:17   CT Chest W Contrast  Result Date: 03/20/2021 CLINICAL DATA:  Urinary retention, UTI, more confused today than usual, altered mental status chest pain, history atrial fibrillation, alcoholic cirrhosis, coronary artery disease, diabetes mellitus, portal hypertension, former smoker EXAM: CT CHEST, ABDOMEN, AND PELVIS WITH CONTRAST TECHNIQUE: Multidetector CT imaging of the chest, abdomen and pelvis was performed following the standard protocol during bolus administration of intravenous contrast. Sagittal and coronal MPR images reconstructed from axial data set. CONTRAST:  141m OMNIPAQUE IOHEXOL 350 MG/ML SOLN IV. No oral contrast. COMPARISON:  CT abdomen and pelvis 08/14/2019 FINDINGS: CT CHEST FINDINGS Cardiovascular: Atherosclerotic calcifications aorta, coronary arteries and proximal great vessels. Aorta normal caliber. Heart unremarkable. No pericardial effusion. Lower lobe pulmonary arterial assessment limited by beam hardening artifacts and respiratory motion. A questionable filling defect in a RIGHT lower lobe pulmonary artery axial image 31 appears to be an artifact on sagittal images. Mediastinum/Nodes: Esophagus unremarkable. Base of cervical region normal appearance. No thoracic adenopathy. Lungs/Pleura: Dependent bibasilar atelectasis and respiratory motion artifacts. No definite pulmonary infiltrate, pleural effusion or pneumothorax. Musculoskeletal: Prior median sternotomy. Bones demineralized. No acute osseous findings. CT ABDOMEN PELVIS FINDINGS Hepatobiliary: Gallbladder contracted with dependent calculi lower gallbladder segment. Cirrhotic appearing nodular liver without discrete mass. Pancreas: Atrophic pancreas without mass Spleen: Normal appearance Adrenals/Urinary Tract: Adrenal thickening without mass. Cyst at upper pole LEFT kidney 4.3 cm greatest diameter. Exophytic cyst upper to mid  RIGHT kidney 1.8 cm diameter. No additional renal mass, hydronephrosis or ureteral dilatation. No urinary tract calcifications. Bladder is partially distended and demonstrates a thickened irregular wall with minimal adjacent infiltrative changes, pattern suggesting cystitis and probably a degree of wall trabeculation. Small bladder diverticula noted. Difficult to exclude mass in this setting. Stomach/Bowel: Sigmoid diverticulosis. Stomach and bowel loops otherwise unremarkable. Vascular/Lymphatic: Atherosclerotic calcifications aorta, visceral arteries, iliac arteries. Aorta normal caliber. No adenopathy. Reproductive: Unremarkable prostate gland and seminal vesicles Other: Umbilical hernia and 2 supraumbilical ventral hernias are identified, all containing fat. No free air or free fluid. Interval decrease in size of a small subcutaneous fluid collection in the LEFT anterior abdominal wall. Musculoskeletal: Osseous demineralization. Degenerative disc and facet disease changes lumbar spine. IMPRESSION: Thickened irregular bladder wall with minimal adjacent infiltrative changes, pattern suggesting cystitis and probably a degree of wall trabeculation. Small bladder diverticula noted. Difficult to exclude mass in this setting, recommend correlation with cystoscopy. Cirrhotic appearing liver without discrete mass. Cholelithiasis. Umbilical and 2 supraumbilical ventral hernias containing fat. Sigmoid diverticulosis. Aortic Atherosclerosis (ICD10-I70.0). Electronically Signed   By: MCrist InfanteD.  On: 03/20/2021 16:28   CT ABDOMEN PELVIS W CONTRAST  Result Date: 03/20/2021 CLINICAL DATA:  Urinary retention, UTI, more confused today than usual, altered mental status chest pain, history atrial fibrillation, alcoholic cirrhosis, coronary artery disease, diabetes mellitus, portal hypertension, former smoker EXAM: CT CHEST, ABDOMEN, AND PELVIS WITH CONTRAST TECHNIQUE: Multidetector CT imaging of the chest, abdomen and  pelvis was performed following the standard protocol during bolus administration of intravenous contrast. Sagittal and coronal MPR images reconstructed from axial data set. CONTRAST:  169m OMNIPAQUE IOHEXOL 350 MG/ML SOLN IV. No oral contrast. COMPARISON:  CT abdomen and pelvis 08/14/2019 FINDINGS: CT CHEST FINDINGS Cardiovascular: Atherosclerotic calcifications aorta, coronary arteries and proximal great vessels. Aorta normal caliber. Heart unremarkable. No pericardial effusion. Lower lobe pulmonary arterial assessment limited by beam hardening artifacts and respiratory motion. A questionable filling defect in a RIGHT lower lobe pulmonary artery axial image 31 appears to be an artifact on sagittal images. Mediastinum/Nodes: Esophagus unremarkable. Base of cervical region normal appearance. No thoracic adenopathy. Lungs/Pleura: Dependent bibasilar atelectasis and respiratory motion artifacts. No definite pulmonary infiltrate, pleural effusion or pneumothorax. Musculoskeletal: Prior median sternotomy. Bones demineralized. No acute osseous findings. CT ABDOMEN PELVIS FINDINGS Hepatobiliary: Gallbladder contracted with dependent calculi lower gallbladder segment. Cirrhotic appearing nodular liver without discrete mass. Pancreas: Atrophic pancreas without mass Spleen: Normal appearance Adrenals/Urinary Tract: Adrenal thickening without mass. Cyst at upper pole LEFT kidney 4.3 cm greatest diameter. Exophytic cyst upper to mid RIGHT kidney 1.8 cm diameter. No additional renal mass, hydronephrosis or ureteral dilatation. No urinary tract calcifications. Bladder is partially distended and demonstrates a thickened irregular wall with minimal adjacent infiltrative changes, pattern suggesting cystitis and probably a degree of wall trabeculation. Small bladder diverticula noted. Difficult to exclude mass in this setting. Stomach/Bowel: Sigmoid diverticulosis. Stomach and bowel loops otherwise unremarkable. Vascular/Lymphatic:  Atherosclerotic calcifications aorta, visceral arteries, iliac arteries. Aorta normal caliber. No adenopathy. Reproductive: Unremarkable prostate gland and seminal vesicles Other: Umbilical hernia and 2 supraumbilical ventral hernias are identified, all containing fat. No free air or free fluid. Interval decrease in size of a small subcutaneous fluid collection in the LEFT anterior abdominal wall. Musculoskeletal: Osseous demineralization. Degenerative disc and facet disease changes lumbar spine. IMPRESSION: Thickened irregular bladder wall with minimal adjacent infiltrative changes, pattern suggesting cystitis and probably a degree of wall trabeculation. Small bladder diverticula noted. Difficult to exclude mass in this setting, recommend correlation with cystoscopy. Cirrhotic appearing liver without discrete mass. Cholelithiasis. Umbilical and 2 supraumbilical ventral hernias containing fat. Sigmoid diverticulosis. Aortic Atherosclerosis (ICD10-I70.0). Electronically Signed   By: MLavonia DanaM.D.   On: 03/20/2021 16:28   DG Chest Portable 1 View  Result Date: 03/20/2021 CLINICAL DATA:  Altered mental status. EXAM: PORTABLE CHEST 1 VIEW COMPARISON:  Chest radiograph 03/15/2021 FINDINGS: Diffuse mild hazy interstitial thickening. No focal consolidation, pleural effusion, or pneumothorax. Stable cardiomediastinal silhouette with cardiomegaly. Aortic calcifications. Sternal wires are aligned and intact. IMPRESSION: Diffuse mild interstitial opacities, suggestive of mild pulmonary edema versus atypical/viral infection. Electronically Signed   By: LIleana RoupM.D.   On: 03/20/2021 13:35      Scheduled Meds:  carvedilol  25 mg Oral BID WC   Chlorhexidine Gluconate Cloth  6 each Topical Daily   enoxaparin  70 mg Subcutaneous QHS   finasteride  5 mg Oral Daily   furosemide  20 mg Oral BID   gabapentin  300 mg Oral TID   insulin aspart  0-15 Units Subcutaneous TID WC   insulin aspart  0-5 Units  Subcutaneous  QHS   lactulose  20 g Oral Daily   nystatin ointment   Topical BID   pantoprazole  40 mg Oral Daily   pravastatin  10 mg Oral QHS   spironolactone  50 mg Oral Daily   tamsulosin  0.4 mg Oral QHS   Continuous Infusions:  ceFEPime (MAXIPIME) IV 2 g (03/22/21 0600)     LOS: 2 days      Time spent: 25 minutes   Dessa Phi, DO [facility] 03/22/2021, 12:23 PM   Available via Epic secure chat 7am-7pm After these hours, please refer to coverage provider listed on [email or web address]

## 2021-03-22 NOTE — Evaluation (Signed)
Occupational Therapy Evaluation Patient Details Name: Brandon [name] MRN: [id number] DOB: 02-22-1946 Today's Date: 03/22/2021    History of Present Illness 75 y/o male presented to ED on 03/20/21 for c/o increased confusion and found to have UTI with urinary retention and metabolic encephalopathy. Pt currently at [facility] short term rehab due to fall at home on 7/10 and ORIF of R ight distal femure fracture on 02/04/21 by Dr. Doreatha Martin. At this time he is TDWB on RLE with permitted ROM to Right knee. Asking for potential update with weight bearign status and healing from ortho during this hosptial. Did also have a recent hospitalizationfor UTI on 8/19 and DC 03/19/21 and returned 03/20/21. Currnetly has a R gluteal wound as well.    PMH: Afib, cirrhosis of liver, colon cancer, CAD, diabetes, HTN, history of GI bleed, obesity, BPH   Clinical Impression   Brandon [name] is a 75 year old man with above medical history who presents with decreased activity tolerance, generalized weakness, impaired balance, obesity and pain. On evaluation patient mod assist to transfer to side of bed and unable to stand with +2 assistance. Patient max x 2 to laterally scoot to recliner. Patient initially TDWB since his surgery but PT received verbal order to advance him to [facility] pending pain. Patient will benefit from skilled OT services while in hospital to improve deficits and learn compensatory strategies as needed in order to return to PLOF.      Follow Up Recommendations  SNF    Equipment Recommendations  None recommended by OT    Recommendations for Other Services       Precautions / Restrictions Precautions Precautions: Fall Restrictions Weight Bearing Restrictions: Yes RLE Weight Bearing: Weight bearing as tolerated      Mobility Bed Mobility Overal bed mobility: Needs Assistance Bed Mobility: Supine to Sit     Supine to sit: Mod assist     General bed mobility comments: Mod assist for hand hold  to pull up on, guiding LEs, and assist with pivoting of hips    Transfers Overall transfer level: Needs assistance Equipment used: Rolling walker (2 wheeled) Transfers: Lateral/Scoot Transfers          Lateral/Scoot Transfers: Max assist;+2 physical assistance General transfer comment: Attempted sit to stand but patient unable to perform with +2 assistance. Scoot to the right with +2 assistance in to recliner. Third person to stabilize recilner. Patient reporting fear.    Balance Overall balance assessment: Needs assistance Sitting-balance support: No upper extremity supported;Feet supported Sitting balance-Leahy Scale: Good                                     ADL either performed or assessed with clinical judgement   ADL Overall ADL's : Needs assistance/impaired Eating/Feeding: Set up;Sitting   Grooming: Set up;Sitting   Upper Body Bathing: Set up;Sitting   Lower Body Bathing: Maximal assistance;Sitting/lateral leans   Upper Body Dressing : Sitting;Minimal assistance   Lower Body Dressing: Total assistance;+2 for physical assistance;+2 for safety/equipment;Sitting/lateral leans   Toilet Transfer: Total assistance;+2 for physical assistance;+2 for safety/equipment;BSC;Requires drop arm   Toileting- Clothing Manipulation and Hygiene: Total assistance;+2 for safety/equipment;+2 for physical assistance;Sitting/lateral lean               Vision Patient Visual Report: No change from baseline       Perception     Praxis      Pertinent  Vitals/Pain Pain Assessment: Faces Faces Pain Scale: Hurts a little bit Pain Location: R lmee Pain Descriptors / Indicators: Aching;Grimacing Pain Intervention(s): Limited activity within patient's tolerance;Monitored during session     Hand Dominance Right   Extremity/Trunk Assessment Upper Extremity Assessment Upper Extremity Assessment: Generalized weakness (overall approx 4-/5 strength, arthritic  shoulders)   Lower Extremity Assessment Lower Extremity Assessment: Defer to PT evaluation       Communication Communication Communication: No difficulties   Cognition Arousal/Alertness: Awake/alert Behavior During Therapy: WFL for tasks assessed/performed Overall Cognitive Status: Within Functional Limits for tasks assessed                                     General Comments       Exercises     Shoulder Instructions      Home Living Family/patient expects to be discharged to:: Skilled nursing facility                                        Prior Functioning/Environment Level of Independence: Needs assistance  Gait / Transfers Assistance Needed: primarily bed bound at SNF due to inability to maintain TDWB in standing     Comments: prior to fall on 02/03/21 pt was independent in his apartment. Live with his mother who unfortunaely fell about 1 month prior to his fall and is in [facility] as well. He used a RW at all times for past several years, hasn't been getting out as much since COVID, meds are delivered, doesn't drive, and sponge baths at times. currently at [facility] they have been limited with mobility due to TDWB, pt states they have not been getting him out of bed.        OT Problem List: Decreased strength;Decreased range of motion;Decreased activity tolerance;Decreased coordination;Decreased safety awareness;Decreased knowledge of use of DME or AE;Obesity;Pain;Impaired balance (sitting and/or standing)      OT Treatment/Interventions: Self-care/ADL training;Therapeutic exercise;Energy conservation;DME and/or AE instruction;Therapeutic activities;Patient/family education;Balance training    OT Goals(Current goals can be found in the care plan section) Acute Rehab OT Goals Patient Stated Goal: My mom and I wish to get back to walking so we can both go home to our apartment OT Goal Formulation: With patient Time For Goal Achievement:  03/30/21 Potential to Achieve Goals: Fair  OT Frequency: Min 2X/week   Barriers to D/C:            Co-evaluation              AM-PAC OT "6 Clicks" Daily Activity     Outcome Measure Help from another person eating meals?: A Little Help from another person taking care of personal grooming?: A Little Help from another person toileting, which includes using toliet, bedpan, or urinal?: Total Help from another person bathing (including washing, rinsing, drying)?: A Lot Help from another person to put on and taking off regular upper body clothing?: A Little Help from another person to put on and taking off regular lower body clothing?: Total 6 Click Score: 13   End of Session Equipment Utilized During Treatment: Rolling walker Nurse Communication: Mobility status  Activity Tolerance: Patient tolerated treatment well Patient left: in CPM;with call bell/phone within reach;with nursing/sitter in room  OT Visit Diagnosis: Unsteadiness on feet (R26.81);Other abnormalities of gait and mobility (R26.89);Muscle weakness (generalized) (M62.81)  [id number] OT Time Calculation (min): 22 min Charges:  OT General Charges $OT Visit: 1 Visit OT Evaluation $OT Eval Moderate Complexity: 1 Mod  Tanganyika Bowlds, OTR/L [place]  Office [phone] Pager: [place] 03/22/2021, 12:48 PM

## 2021-03-23 DIAGNOSIS — K7460 Unspecified cirrhosis of liver: Secondary | ICD-10-CM

## 2021-03-23 LAB — RESP PANEL BY RT-PCR (FLU A&B, COVID) ARPGX2
Influenza A by PCR: NEGATIVE
Influenza B by PCR: NEGATIVE
SARS Coronavirus 2 by RT PCR: NEGATIVE

## 2021-03-23 LAB — GLUCOSE, CAPILLARY
Glucose-Capillary: 123 mg/dL — ABNORMAL HIGH (ref 70–99)
Glucose-Capillary: 127 mg/dL — ABNORMAL HIGH (ref 70–99)
Glucose-Capillary: 172 mg/dL — ABNORMAL HIGH (ref 70–99)

## 2021-03-23 MED ORDER — SERTRALINE HCL 25 MG PO TABS
25.0000 mg | ORAL_TABLET | Freq: Every day | ORAL | Status: DC
  Administered 2021-03-23 – 2021-03-24 (×2): 25 mg via ORAL
  Filled 2021-03-23 (×2): qty 1

## 2021-03-23 MED ORDER — SULFAMETHOXAZOLE-TRIMETHOPRIM 800-160 MG PO TABS: 1.0000 | ORAL_TABLET | Freq: Two times a day (BID) | ORAL | 0 refills | Status: AC

## 2021-03-23 MED ORDER — TAMSULOSIN HCL 0.4 MG PO CAPS: 0.8000 mg | ORAL_CAPSULE | Freq: Every day | ORAL | 0 refills | Status: DC

## 2021-03-23 NOTE — Consult Note (Signed)
Consultation Note Date: 03/23/2021   Patient Name: Brandon [name]  DOB: [birth year]  MRN: [id number]  Age / Sex: 75 y.o., male  PCP: Helane Rima, MD Referring Physician: Dessa Phi, DO  Reason for Consultation: Establishing goals of care  HPI/Patient Profile: 75 y.o. male  with past medical history of A. fib, cirrhosis, colon cancer, CAD, diabetes, obesity, BPH, right distal femur repair approximately 5 weeks ago admitted on 03/20/2021 with UTI.  He had a recent admission 8/19-8/23 and was discharged on oral ciprofloxacin.  He was sent back from facility due to being more confused than usual and was noted to have "chocolate milk" colored urine.  Palliative consulted for goals of care.  Clinical Assessment and Goals of Care: I met today with Brandon [name].   I introduced palliative care as specialized medical care for people living with serious illness. It focuses on providing relief from the symptoms and stress of a serious illness. The goal is to improve quality of life for both the patient and the family.  He is lying in bed in no distress and is pleasant throughout encounter.  He has been living with his mother who is 65 years old.  Typical day consists of getting up, watching some TV, feeding birds and fish, and general conversation with his mother.  Unfortunately, she had a fall a couple of months ago and has been in the facility since that time.  He said shortly after that he developed pain around his knee and was found to have distal femur fracture that was subsequently repaired.  He had an up-and-down course at rehab and reports that he has been doing okay there but does admit that he has not been making as significant of functional gains as he would hope.  We discussed his clinical course as well as wishes moving forward in regard to advanced directives.  Concepts specific to code status and  rehospitalization discussed.  Values and goals of care important to patient and family were attempted to be elicited.  His primary concern is being able to return home with his mother.  He endorses that he would want to continue with current care but would not want heroic interventions if he were to decline despite best medical interventions.  I also called and spoke with his son, Brandon [name].  Brandon [name] expressed concern that his father has had significant decline in his functional status over the past couple of months.  He says that patient's mother with whom he lives is getting close to her baseline, however, Brandon [name] is concerned because his father is still significantly deconditioned and not in a place where he is able to care for himself and help care for his mother as he had in the past.  I shared with Brandon [name] my concern that Brandon [name] is significantly deconditioned and has multiple comorbidities that are not going to go away.  We discussed nutrition, cognition, and functional status and how these can be indicators into how his father is progressing overall.  While he has been working  hard to rehab, I shared my fear that it may be difficult for Brandon [name] to recover back to a point of living independently.  Brandon [name] expressed this is his concern as well.   Concept of Hospice and Palliative Care were discussed.   Questions and concerns addressed.   PMT will continue to support holistically.  SUMMARY OF RECOMMENDATIONS   -DNR/DNI -Mr. Kishi would like to transition back to [place] at time of discharge to continue to focus on rehab. -Discussed with patient's son who understands concerns about long-term ability of Brandon [name] and his grandmother (Brandon [name] mother) to continue current living arrangement where they live together and assist each other.  Will attempt to gather further input from interdisciplinary team regarding this. -I would recommend palliative care to follow as an outpatient at time  of discharge.  Code Status/Advance Care Planning: DNR  Palliative Prophylaxis:  Bowel Regimen and Frequent Pain Assessment  Additional Recommendations (Limitations, Scope, Preferences): Full Scope Treatment  Psycho-social/Spiritual:  Desire for further Chaplaincy support:no Additional Recommendations: Caregiving  Support/Resources  Prognosis:  Unable to determine  Discharge Planning: [facility] for rehab with Palliative care service follow-up      Primary Diagnoses: Present on Admission:  Acute metabolic encephalopathy  Atrial fibrillation (HCC)  CAD (coronary artery disease)  Chronic heart failure with preserved ejection fraction (HFpEF) (HCC)  Cirrhosis of liver (HCC)  Obesity BMI over 52  UTI (urinary tract infection)  Pressure injury of skin   I have reviewed the medical record, interviewed the patient and family, and examined the patient. The following aspects are pertinent.  Past Medical History:  Diagnosis Date   Anemia 07/2019   Hb 6.7 >> 3 PRBCs >> 8.8 in 07/2019.      Atrial fibrillation ([place])    Cholelithiasis    Cirrhosis of liver ([place])    Presumed due to EtOH as well as fatty liver from morbid obesity.  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   Obesity 07/2019   Portal hypertension ([place])    Tubular adenoma of colon    Social History   Socioeconomic History   Marital status: Widowed    Spouse name: Not on file   Number of children: Not on file   Years of education: Not on file   Highest education level: Not on file  Occupational History   Not on file  Tobacco Use   Smoking status: Former    Types: Cigarettes    Quit date:  70    Years since quitting: 32.6   Smokeless tobacco: Never  Vaping Use   Vaping Use: Never used  Substance and Sexual Activity   Alcohol use: Not Currently    Comment: 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   Drug use: Never   Sexual activity: Not Currently  Other Topics Concern   Not on file  Social History Narrative   Not on file   Social Determinants of Health   Financial Resource Strain: Not on file  Food Insecurity: Not on file  Transportation Needs: Not on file  Physical Activity: Not on file  Stress: Not on file  Social Connections: Not  on file   Family History  Problem Relation Age of Onset   Colon cancer Father    CAD Other    Colon cancer Paternal Uncle    Colon cancer Paternal Uncle    Diabetes Neg Hx    Scheduled Meds:  carvedilol  25 mg Oral BID WC   Chlorhexidine Gluconate Cloth  6 each Topical Daily   enoxaparin  70 mg Subcutaneous QHS   finasteride  5 mg Oral Daily   furosemide  20 mg Oral BID   gabapentin  300 mg Oral TID   insulin aspart  0-15 Units Subcutaneous TID WC   insulin aspart  0-5 Units Subcutaneous QHS   lactulose  20 g Oral Daily   nystatin ointment   Topical BID   pantoprazole  40 mg Oral Daily   pravastatin  10 mg Oral QHS   spironolactone  50 mg Oral Daily   sulfamethoxazole-trimethoprim  1 tablet Oral Q12H   tamsulosin  0.8 mg Oral QHS   Continuous Infusions: PRN Meds:.acetaminophen **OR** acetaminophen, diphenhydrAMINE, ondansetron **OR** ondansetron (ZOFRAN) IV Medications Prior to Admission:  Prior to Admission medications   Medication Sig Start Date End Date Taking? Authorizing Provider  acetaminophen (TYLENOL) 500 MG tablet Take 1,000-1,500 mg by mouth every 6 (six) hours as needed for moderate pain.   Yes [provider]  carvedilol (COREG) 25 MG tablet Take 25 mg by mouth 2 (two) times daily with a meal.   Yes [provider]  enoxaparin (LOVENOX) 30 MG/0.3ML injection Inject 30 mg into the skin daily.  This is taken in addition to the 12m dose, for a total of 734mper day   Yes [provider]  enoxaparin (LOVENOX) 40 MG/0.4ML injection Inject 40 mg into the skin daily. This is taken in addition to the 3077mose, for a total of 38m79mr day   Yes [provider]  finasteride (PROSCAR) 5 MG tablet Take 1 tablet (5 mg total) by mouth daily. 06/05/20  Yes Dagar, AnjaMeredith Staggers  furosemide (LASIX) 20 MG tablet Take 20 mg by mouth 2 (two) times daily. 12/26/20  Yes [provider]  gabapentin (NEURONTIN) 300 MG capsule Take 1 capsule (300 mg total) by mouth 3 (three) times daily. 03/19/21  Yes KrisBonnielee Haff  HYDROcodone-acetaminophen (NORCO/VICODIN) 5-325 MG tablet Take 1-2 tablets by mouth every 4 (four) hours as needed for moderate pain (pain score 4-6). 03/19/21  Yes KrisBonnielee Haff  hydrOXYzine (ATARAX/VISTARIL) 25 MG tablet Take 1 tablet (25 mg total) by mouth daily. 03/19/21  Yes KrisBonnielee Haff  lactulose (CHRONULAC) 10 GM/15ML solution Take 30 mLs (20 g total) by mouth daily. 03/20/21  Yes KrisBonnielee Haff  metFORMIN (GLUCOPHAGE-XR) 500 MG 24 hr tablet Take 500 mg by mouth daily with breakfast.  02/18/18  Yes [provider]  ondansetron (ZOFRAN) 4 MG tablet Take 1 tablet (4 mg total) by mouth every 6 (six) hours as needed for nausea. 02/06/21  Yes YacoDelray Alt-C  pantoprazole (PROTONIX) 40 MG tablet Take 1 tablet (40 mg total) by mouth daily. 11/08/19 03/20/21 Yes Esterwood, Amy S, PA-C  pravastatin (PRAVACHOL) 10 MG tablet Take 10 mg by mouth at bedtime. 02/18/18  Yes [provider]  spironolactone (ALDACTONE) 50 MG tablet Take 50 mg by mouth daily.   Yes [provider]  tamsulosin (FLOMAX) 0.4 MG CAPS capsule Take 0.4 mg by mouth at bedtime. 02/18/18  Yes [provider]   No Known Allergies  Review of Systems  Constitutional:  Positive for activity change and fatigue.  Respiratory:  Positive for shortness of breath.    Psychiatric/Behavioral:  Positive for sleep disturbance.    Physical Exam General: Alert, awake, in no acute distress.   HEENT: No bruits, no goiter, no JVD Heart: Regular rate and rhythm. No murmur appreciated. Lungs: Good air movement, clear Abdomen: Soft, nontender, nondistended, positive bowel sounds.   Ext: No significant edema Skin: Warm and dry Neuro: Grossly intact, nonfocal.   Vital Signs: BP 128/79 (BP Location: Left Arm)   Pulse 62   Temp 98.6 F (37 C) (Oral)   Resp 18   Ht 5' 6" (1.676 m)   Wt 134.4 kg   SpO2 95%   BMI 47.82 kg/m  Pain Scale: 0-10 POSS *See Group Information*: 1-Acceptable,Awake and alert Pain Score: 5    SpO2: SpO2: 95 % O2 Device:SpO2: 95 % O2 Flow Rate: .O2 Flow Rate (L/min): 0 L/min  IO: Intake/output summary:  Intake/Output Summary (Last 24 hours) at 03/23/2021 0849 Last data filed at 03/23/2021 0500 Gross per 24 hour  Intake 3700 ml  Output 2625 ml  Net 1075 ml    LBM: Last BM Date: 03/20/21 Baseline Weight: Weight: 136.1 kg Most recent weight: Weight: 134.4 kg     Palliative Assessment/Data:   Flowsheet Rows    Flowsheet Row Most Recent Value  Intake Tab   Referral Department Hospitalist  Unit at Time of Referral Med/Surg Unit  Palliative Care Primary Diagnosis Sepsis/Infectious Disease  Date Notified 03/20/21  Palliative Care Type New Palliative care  Reason for referral Clarify Goals of Care  Date of Admission 03/20/21  Date first seen by Palliative Care 03/22/21  # of days Palliative referral response time 2 Day(s)  # of days IP prior to Palliative referral 0  Clinical Assessment   Palliative Performance Scale Score 50%  Psychosocial & Spiritual Assessment   Palliative Care Outcomes   Patient/Family meeting held? Yes  Who was at the meeting? Patient, son via phone       Time In: 1440 Time Out: 1600 Time Total: 80 Greater than 50%  of this time was spent counseling and coordinating care related to the  above assessment and plan.  Signed by: Micheline Rough, MD   Please contact Palliative Medicine Team phone at [phone] for questions and concerns.  For individual provider: See Shea Evans

## 2021-03-23 NOTE — Discharge Summary (Addendum)
Physician Discharge Summary  Brandon [name] [id number] DOB: 1946/06/08 DOA: 03/20/2021  PCP: Helane Rima, MD  Admit date: 03/20/2021 Discharge date: 03/24/2021  Admitted From: SNF Disposition: SNF  Recommendations for Outpatient Follow-up:  Follow up with PCP in 1 week Follow up with orthopedic surgery as scheduled 9/13 Follow-up with outpatient palliative care medicine Follow-up with urology as outpatient for outpatient voiding trial.  Continue catheter until outpatient urology follow-up.  Discharge Condition: Stable CODE STATUS: DNR Diet recommendation:  Diet Orders (From admission, onward)     Start     Ordered   03/20/21 2201  Diet Carb Modified Fluid consistency: Thin; Room service appropriate? Yes  Diet effective now       Question Answer Comment  Diet-HS Snack? Nothing   Calorie Level Medium 1600-2000   Fluid consistency: Thin   Room service appropriate? Yes      03/20/21 2201             Brief/Interim Summary: Brandon [name] is a 75 year old male with a history of A. fib, liver cirrhosis, colon CA, CAD, diabetes, obesity, BPH, right knee surgery 4 weeks ago on therapeutic Lovenox, recent admission 8/19-8/23, was just discharged 8/23.  Patient was treated for UTI, acute kidney injury, elevated ammonia and was discharged on oral ciprofloxacin.  Patient was sent back from the facility, staff reported "patient was more confused than usual". In ED, on examination patient is alert and oriented to self and place, but not able to give a good history.  He denies any fevers or chills, states he has diffuse "aches and pains everywhere", no coughing.  For other questions he states "I don't know". Patient was noted to be retaining urine in ED and Foley catheter was placed with removal of 1.8 L of milky chocolate colored urine/pyuria. CT abdomen showed cystitis, difficult to exclude mass in the setting recommended cystoscopy, liver cirrhosis without discrete mass, cholelithiasis.    Patient was admitted for acute metabolic encephalopathy secondary to urinary retention and UTI.  Urology was consulted, patient was treated with IV antibiotics with improvement in his mentation.  Discharge Diagnoses:  Principal Problem:   Acute metabolic encephalopathy Active Problems:   CAD (coronary artery disease)   DM (diabetes mellitus), type 2 (HCC)   Obesity BMI over 52   Atrial fibrillation (HCC)   Chronic heart failure with preserved ejection fraction (HFpEF) (HCC)   Cirrhosis of liver (HCC)   Pressure injury of skin   UTI (urinary tract infection)   Acute metabolic encephalopathy is thought to be secondary to urinary retention and UTI -CT head: no acute intracranial process -Improved, likely back to his baseline  UTI with urinary retention -Foley catheter was placed at time of admission -CT A/P: Thickened irregular bladder wall with minimal adjacent infiltrative changes, pattern suggesting cystitis and probably a degree of wall trabeculation. Difficult to exclude mass in this setting, recommend correlation with cystoscopy. -Started on IV cefepime --> Urine culture 8/24 negative but urine culture from 8/19 showed enterobacter. He did not seem to tolerate cipro, will deescalate to bactrim today to treat total 2 week course (last day 8/1)  -Continue proscar and flomax  -Urology consulted, recommended to keep catheter in place, re-eval as outpatient   Diabetes mellitus type 2 -Continue sliding scale insulin  Chronic A. fib -Continue Coreg -Patient is not on long-term anticoagulation due to prior history of GI bleed and liver cirrhosis however he has been on DVT prophylaxis due to right femoral fracture in 01/2021   Chronic diastolic  heart failure -Continue Lasix, spironolactone    HLD -Continue pravachol   Cirrhosis -Continue lactulose -Ammonia level 29  Recent distal right femoral fracture July 2022 -PT OT -Has outpatient orthopedic surgery follow-up 9/13   In  agreement with assessment of the pressure ulcer as below:  Pressure Injury 03/20/21 Buttocks Right Stage 2 -  Partial thickness loss of dermis presenting as a shallow open injury with a red, pink wound bed without slough. (Active)  03/20/21 2305  Location: Buttocks  Location Orientation: Right  Staging: Stage 2 -  Partial thickness loss of dermis presenting as a shallow open injury with a red, pink wound bed without slough.  Wound Description (Comments):   Present on Admission: Yes       Discharge Instructions  Discharge Instructions     Increase activity slowly   Complete by: As directed    No wound care   Complete by: As directed       Allergies as of 03/24/2021   No Known Allergies      Medication List     STOP taking these medications    HYDROcodone-acetaminophen 5-325 MG tablet Commonly known as: NORCO/VICODIN   hydrOXYzine 25 MG tablet Commonly known as: ATARAX/VISTARIL       TAKE these medications    acetaminophen 500 MG tablet Commonly known as: TYLENOL Take 1,000-1,500 mg by mouth every 6 (six) hours as needed for moderate pain.   carvedilol 25 MG tablet Commonly known as: COREG Take 25 mg by mouth 2 (two) times daily with a meal.   enoxaparin 30 MG/0.3ML injection Commonly known as: LOVENOX Inject 30 mg into the skin daily. This is taken in addition to the '40mg'$  dose, for a total of '70mg'$  per day   enoxaparin 40 MG/0.4ML injection Commonly known as: LOVENOX Inject 40 mg into the skin daily. This is taken in addition to the '30mg'$  dose, for a total of '70mg'$  per day   finasteride 5 MG tablet Commonly known as: PROSCAR Take 1 tablet (5 mg total) by mouth daily.   furosemide 20 MG tablet Commonly known as: LASIX Take 20 mg by mouth 2 (two) times daily.   gabapentin 300 MG capsule Commonly known as: NEURONTIN Take 1 capsule (300 mg total) by mouth 3 (three) times daily.   lactulose 10 GM/15ML solution Commonly known as: CHRONULAC Take 30 mLs  (20 g total) by mouth daily.   metFORMIN 500 MG 24 hr tablet Commonly known as: GLUCOPHAGE-XR Take 500 mg by mouth daily with breakfast.   ondansetron 4 MG tablet Commonly known as: ZOFRAN Take 1 tablet (4 mg total) by mouth every 6 (six) hours as needed for nausea.   pantoprazole 40 MG tablet Commonly known as: PROTONIX Take 1 tablet (40 mg total) by mouth daily.   pravastatin 10 MG tablet Commonly known as: PRAVACHOL Take 10 mg by mouth at bedtime.   sertraline 25 MG tablet Commonly known as: ZOLOFT Take 1 tablet (25 mg total) by mouth daily.   spironolactone 50 MG tablet Commonly known as: ALDACTONE Take 50 mg by mouth daily.   sulfamethoxazole-trimethoprim 800-160 MG tablet Commonly known as: BACTRIM DS Take 1 tablet by mouth every 12 (twelve) hours for 5 days.   tamsulosin 0.4 MG Caps capsule Commonly known as: FLOMAX Take 2 capsules (0.8 mg total) by mouth at bedtime. What changed: how much to take        Follow-up Information     Helane Rima, MD. Schedule an appointment as soon  as possible for a visit in 1 week(s).   Specialty: Family Medicine Contact information: [place] [address] [phone]         Shona Needles, MD. Go on 04/09/2021.   Specialty: Orthopedic Surgery Contact information: [place] [address] [phone]         Lucas Mallow, MD. Schedule an appointment as soon as possible for a visit in 1 week(s).   Specialty: Urology Why: For voiding trial Contact information: [place] [address] [phone]                No Known Allergies  Consultations: Urology Palliative care medicine   Procedures/Studies: DG Chest 1 View  Result Date: 03/15/2021 CLINICAL DATA:  Sacral osteomyelitis, weakness EXAM: CHEST  1 VIEW COMPARISON:  02/08/2021 FINDINGS: Single frontal view of the chest demonstrates an unremarkable cardiac  silhouette. Postsurgical changes from median sternotomy. Stable atherosclerosis of the aorta. No airspace disease, effusion, or pneumothorax. No acute bony abnormality. IMPRESSION: 1. Stable chest, no acute process. Electronically Signed   By: Randa Ngo M.D.   On: 03/15/2021 17:02   DG Sacrum/Coccyx  Result Date: 03/15/2021 CLINICAL DATA:  Wound EXAM: SACRUM AND COCCYX - 2+ VIEW COMPARISON:  02/03/2021 FINDINGS: Pubic symphysis and rami are intact. No SI joint widening. No fracture or malalignment. No definitive osseous destructive change. IMPRESSION: No definite acute osseous abnormality. Cross-sectional imaging follow-up if continued suspicion for infection Electronically Signed   By: Donavan Foil M.D.   On: 03/15/2021 16:47   DG Knee 1-2 Views Right  Result Date: 03/15/2021 CLINICAL DATA:  Knee pain EXAM: RIGHT KNEE - 1-2 VIEW COMPARISON:  02/04/2021 FINDINGS: Partially visualized surgical plate and fixating screws across comminuted extra-articular distal femoral fracture with residual lucency and no abundant callus. Patellar fracture with less apparent lucency suggests some interval healing. Diffuse soft tissue swelling with effusion. Advanced arthritis of the knee. IMPRESSION: 1. Partially visualized surgical plate and fixating screws in the distal femur across comminuted distal femoral fracture without much interval bridging callus. 2. Redemonstrated patella fracture with less visible lucency suggestive of some healing 3. Arthritis of the knee Electronically Signed   By: Donavan Foil M.D.   On: 03/15/2021 19:40   CT HEAD WO CONTRAST (5MM)  Result Date: 03/20/2021 CLINICAL DATA:  Delirium, history of urinary tract infection, chest pain EXAM: CT HEAD WITHOUT CONTRAST TECHNIQUE: Contiguous axial images were obtained from the base of the skull through the vertex without intravenous contrast. COMPARISON:  02/03/2021 FINDINGS: Brain: No acute infarct or hemorrhage. Lateral ventricles and midline  structures are unremarkable. No acute extra-axial fluid collections. No mass effect. Vascular: Stable atherosclerosis.  No hyperdense vessel. Skull: Normal. Negative for fracture or focal lesion. Sinuses/Orbits: Stable opacification of the left maxillary sinus. Remaining paranasal sinuses are clear. Other: None. IMPRESSION: 1. Stable head CT, no acute intracranial process. Electronically Signed   By: Randa Ngo M.D.   On: 03/20/2021 16:17   CT Chest W Contrast  Result Date: 03/20/2021 CLINICAL DATA:  Urinary retention, UTI, more confused today than usual, altered mental status chest pain, history atrial fibrillation, alcoholic cirrhosis, coronary artery disease, diabetes mellitus, portal hypertension, former smoker EXAM: CT CHEST, ABDOMEN, AND PELVIS WITH CONTRAST TECHNIQUE: Multidetector CT imaging of the chest, abdomen and pelvis was performed following the standard protocol during bolus administration of intravenous contrast. Sagittal and coronal MPR images reconstructed from axial data set. CONTRAST:  183m OMNIPAQUE IOHEXOL 350 MG/ML SOLN IV. No oral contrast. COMPARISON:  CT abdomen and pelvis 08/14/2019 FINDINGS: CT CHEST FINDINGS Cardiovascular: Atherosclerotic calcifications aorta, coronary arteries and proximal great vessels. Aorta normal caliber. Heart unremarkable. No pericardial effusion. Lower lobe pulmonary arterial assessment limited by beam hardening artifacts and respiratory motion. A questionable filling defect in a RIGHT lower lobe pulmonary artery axial image 31 appears to be an artifact on sagittal images. Mediastinum/Nodes: Esophagus unremarkable. Base of cervical region normal appearance. No thoracic adenopathy. Lungs/Pleura: Dependent bibasilar atelectasis and respiratory motion artifacts. No definite pulmonary infiltrate, pleural effusion or pneumothorax. Musculoskeletal: Prior median sternotomy. Bones demineralized. No acute osseous findings. CT ABDOMEN PELVIS FINDINGS  Hepatobiliary: Gallbladder contracted with dependent calculi lower gallbladder segment. Cirrhotic appearing nodular liver without discrete mass. Pancreas: Atrophic pancreas without mass Spleen: Normal appearance Adrenals/Urinary Tract: Adrenal thickening without mass. Cyst at upper pole LEFT kidney 4.3 cm greatest diameter. Exophytic cyst upper to mid RIGHT kidney 1.8 cm diameter. No additional renal mass, hydronephrosis or ureteral dilatation. No urinary tract calcifications. Bladder is partially distended and demonstrates a thickened irregular wall with minimal adjacent infiltrative changes, pattern suggesting cystitis and probably a degree of wall trabeculation. Small bladder diverticula noted. Difficult to exclude mass in this setting. Stomach/Bowel: Sigmoid diverticulosis. Stomach and bowel loops otherwise unremarkable. Vascular/Lymphatic: Atherosclerotic calcifications aorta, visceral arteries, iliac arteries. Aorta normal caliber. No adenopathy. Reproductive: Unremarkable prostate gland and seminal vesicles Other: Umbilical hernia and 2 supraumbilical ventral hernias are identified, all containing fat. No free air or free fluid. Interval decrease in size of a small subcutaneous fluid collection in the LEFT anterior abdominal wall. Musculoskeletal: Osseous demineralization. Degenerative disc and facet disease changes lumbar spine. IMPRESSION: Thickened irregular bladder wall with minimal adjacent infiltrative changes, pattern suggesting cystitis and probably a degree of wall trabeculation. Small bladder diverticula noted. Difficult to exclude mass in this setting, recommend correlation with cystoscopy. Cirrhotic appearing liver without discrete mass. Cholelithiasis. Umbilical and 2 supraumbilical ventral hernias containing fat. Sigmoid diverticulosis. Aortic Atherosclerosis (ICD10-I70.0). Electronically Signed   By: MLavonia DanaM.D.   On: 03/20/2021 16:28   CT ABDOMEN PELVIS W CONTRAST  Result Date:  03/20/2021 CLINICAL DATA:  Urinary retention, UTI, more confused today than usual, altered mental status chest pain, history atrial fibrillation, alcoholic cirrhosis, coronary artery disease, diabetes mellitus, portal hypertension, former smoker EXAM: CT CHEST, ABDOMEN, AND PELVIS WITH CONTRAST TECHNIQUE: Multidetector CT imaging of the chest, abdomen and pelvis was performed following the standard protocol during bolus administration of intravenous contrast. Sagittal and coronal MPR images reconstructed from axial data set. CONTRAST:  1055mOMNIPAQUE IOHEXOL 350 MG/ML SOLN IV. No oral contrast. COMPARISON:  CT abdomen and pelvis 08/14/2019 FINDINGS: CT CHEST FINDINGS Cardiovascular: Atherosclerotic calcifications aorta, coronary arteries and proximal great vessels. Aorta normal caliber. Heart unremarkable. No pericardial effusion. Lower lobe pulmonary arterial assessment limited by beam hardening artifacts and respiratory motion. A questionable filling defect in a RIGHT lower lobe pulmonary artery axial image 31 appears to be an artifact on sagittal images. Mediastinum/Nodes: Esophagus unremarkable. Base of cervical region normal appearance. No thoracic adenopathy. Lungs/Pleura: Dependent bibasilar atelectasis and respiratory motion artifacts. No definite pulmonary infiltrate, pleural effusion or pneumothorax. Musculoskeletal: Prior median sternotomy. Bones demineralized. No acute osseous findings. CT ABDOMEN PELVIS FINDINGS Hepatobiliary: Gallbladder contracted with dependent calculi lower gallbladder segment. Cirrhotic appearing nodular liver without discrete mass. Pancreas: Atrophic pancreas without mass Spleen: Normal appearance Adrenals/Urinary Tract: Adrenal thickening without mass. Cyst at upper pole LEFT kidney 4.3 cm greatest diameter. Exophytic  cyst upper to mid RIGHT kidney 1.8 cm diameter. No additional renal mass, hydronephrosis or ureteral dilatation. No urinary tract calcifications. Bladder is  partially distended and demonstrates a thickened irregular wall with minimal adjacent infiltrative changes, pattern suggesting cystitis and probably a degree of wall trabeculation. Small bladder diverticula noted. Difficult to exclude mass in this setting. Stomach/Bowel: Sigmoid diverticulosis. Stomach and bowel loops otherwise unremarkable. Vascular/Lymphatic: Atherosclerotic calcifications aorta, visceral arteries, iliac arteries. Aorta normal caliber. No adenopathy. Reproductive: Unremarkable prostate gland and seminal vesicles Other: Umbilical hernia and 2 supraumbilical ventral hernias are identified, all containing fat. No free air or free fluid. Interval decrease in size of a small subcutaneous fluid collection in the LEFT anterior abdominal wall. Musculoskeletal: Osseous demineralization. Degenerative disc and facet disease changes lumbar spine. IMPRESSION: Thickened irregular bladder wall with minimal adjacent infiltrative changes, pattern suggesting cystitis and probably a degree of wall trabeculation. Small bladder diverticula noted. Difficult to exclude mass in this setting, recommend correlation with cystoscopy. Cirrhotic appearing liver without discrete mass. Cholelithiasis. Umbilical and 2 supraumbilical ventral hernias containing fat. Sigmoid diverticulosis. Aortic Atherosclerosis (ICD10-I70.0). Electronically Signed   By: Lavonia Dana M.D.   On: 03/20/2021 16:28   DG Chest Portable 1 View  Result Date: 03/20/2021 CLINICAL DATA:  Altered mental status. EXAM: PORTABLE CHEST 1 VIEW COMPARISON:  Chest radiograph 03/15/2021 FINDINGS: Diffuse mild hazy interstitial thickening. No focal consolidation, pleural effusion, or pneumothorax. Stable cardiomediastinal silhouette with cardiomegaly. Aortic calcifications. Sternal wires are aligned and intact. IMPRESSION: Diffuse mild interstitial opacities, suggestive of mild pulmonary edema versus atypical/viral infection. Electronically Signed   By: Ileana Roup M.D.   On: 03/20/2021 13:35       Discharge Exam: Vitals:   03/23/21 0542 03/23/21 0700  BP: 133/60 128/79  Pulse: 62   Resp: 18   Temp:    SpO2: 95%     General: Pt is alert, awake, not in acute distress Cardiovascular: RRR, S1/S2 +, no edema Respiratory: CTA bilaterally, no wheezing, no rhonchi, no respiratory distress, no conversational dyspnea  Abdominal: Soft, NT, ND, bowel sounds + Extremities: no edema, no cyanosis Psych: Normal mood and affect, stable judgement and insight     The results of significant diagnostics from this hospitalization (including imaging, microbiology, ancillary and laboratory) are listed below for reference.     Microbiology: Recent Results (from the past 240 hour(s))  Urine Culture     Status: Abnormal   Collection Time: 03/15/21  3:21 PM   Specimen: In/Out Cath Urine  Result Value Ref Range Status   Specimen Description IN/OUT CATH URINE  Final   Special Requests   Final    NONE Performed at [facility], [address] [place], [place] [address]    Culture >=100,000 COLONIES/mL ENTEROBACTER CLOACAE (A)  Final   Report Status 03/18/2021 FINAL  Final   Organism ID, Bacteria ENTEROBACTER CLOACAE (A)  Final      Susceptibility   Enterobacter cloacae - MIC*    CEFAZOLIN >=64 RESISTANT Resistant     CEFEPIME <=0.12 SENSITIVE Sensitive     CIPROFLOXACIN <=0.25 SENSITIVE Sensitive     GENTAMICIN <=1 SENSITIVE Sensitive     IMIPENEM 0.5 SENSITIVE Sensitive     NITROFURANTOIN 32 SENSITIVE Sensitive     TRIMETH/SULFA <=20 SENSITIVE Sensitive     PIP/TAZO <=4 SENSITIVE Sensitive     * >=100,000 COLONIES/mL ENTEROBACTER CLOACAE  Blood Culture (routine x 2)     Status: None   Collection Time: 03/15/21  3:35  PM   Specimen: BLOOD RIGHT HAND  Result Value Ref Range Status   Specimen Description BLOOD RIGHT HAND  Final   Special Requests   Final    BOTTLES DRAWN AEROBIC AND ANAEROBIC Blood Culture results may not be optimal due to  an inadequate volume of blood received in culture bottles   Culture   Final    NO GROWTH 5 DAYS Performed at [facility], [address] [place], [place] [address]    Report Status 03/20/2021 FINAL  Final  Blood Culture (routine x 2)     Status: None   Collection Time: 03/15/21  3:35 PM   Specimen: BLOOD LEFT HAND  Result Value Ref Range Status   Specimen Description BLOOD LEFT HAND  Final   Special Requests   Final    BOTTLES DRAWN AEROBIC AND ANAEROBIC Blood Culture results may not be optimal due to an inadequate volume of blood received in culture bottles   Culture   Final    NO GROWTH 5 DAYS Performed at [facility], [place] [address] [place], [place] [address]    Report Status 03/20/2021 FINAL  Final  Resp Panel by RT-PCR (Flu A&B, Covid) Nasopharyngeal Swab     Status: None   Collection Time: 03/15/21  7:30 PM   Specimen: Nasopharyngeal Swab; Nasopharyngeal(NP) swabs in vial transport medium  Result Value Ref Range Status   SARS Coronavirus 2 by RT PCR NEGATIVE NEGATIVE Final    Comment: (NOTE) SARS-CoV-2 target nucleic acids are NOT DETECTED.  The SARS-CoV-2 RNA is generally detectable in upper respiratory specimens during the acute phase of infection. The lowest concentration of SARS-CoV-2 viral copies this assay can detect is 138 copies/mL. A negative result does not preclude SARS-Cov-2 infection and should not be used as the sole basis for treatment or other patient management decisions. A negative result may occur with  improper specimen collection/handling, submission of specimen other than nasopharyngeal swab, presence of viral mutation(s) within the areas targeted by this assay, and inadequate number of viral copies(<138 copies/mL). A negative result must be combined with clinical observations, patient history, and epidemiological information. The expected result is Negative.  Fact Sheet for Patients:   EntrepreneurPulse.com.au  Fact Sheet for Healthcare Providers:  IncredibleEmployment.be  This test is no t yet approved or cleared by the [place] FDA and  has been authorized for detection and/or diagnosis of SARS-CoV-2 by FDA under an Emergency Use Authorization (EUA). This EUA will remain  in effect (meaning this test can be used) for the duration of the COVID-19 declaration under Section 564(b)(1) of the Act, 21 U.S.C.section 360bbb-3(b)(1), unless the authorization is terminated  or revoked sooner.       Influenza A by PCR NEGATIVE NEGATIVE Final   Influenza B by PCR NEGATIVE NEGATIVE Final    Comment: (NOTE) The Xpert Xpress SARS-CoV-2/FLU/RSV plus assay is intended as an aid in the diagnosis of influenza from Nasopharyngeal swab specimens and should not be used as a sole basis for treatment. Nasal washings and aspirates are unacceptable for Xpert Xpress SARS-CoV-2/FLU/RSV testing.  Fact Sheet for Patients: EntrepreneurPulse.com.au  Fact Sheet for Healthcare Providers: IncredibleEmployment.be  This test is not yet approved or cleared by the [place] FDA and has been authorized for detection and/or diagnosis of SARS-CoV-2 by FDA under an Emergency Use Authorization (EUA). This EUA will remain in effect (meaning this test can be used) for the duration of the COVID-19 declaration under Section 564(b)(1) of the Act,  21 U.S.C. section 360bbb-3(b)(1), unless the authorization is terminated or revoked.  Performed at [facility], [place] [address], [place], [place] [address]   Resp Panel by RT-PCR (Flu A&B, Covid) Nasopharyngeal Swab     Status: None   Collection Time: 03/18/21  4:14 PM   Specimen: Nasopharyngeal Swab; Nasopharyngeal(NP) swabs in vial transport medium  Result Value Ref Range Status   SARS Coronavirus 2 by RT PCR NEGATIVE NEGATIVE Final    Comment: (NOTE) SARS-CoV-2  target nucleic acids are NOT DETECTED.  The SARS-CoV-2 RNA is generally detectable in upper respiratory specimens during the acute phase of infection. The lowest concentration of SARS-CoV-2 viral copies this assay can detect is 138 copies/mL. A negative result does not preclude SARS-Cov-2 infection and should not be used as the sole basis for treatment or other patient management decisions. A negative result may occur with  improper specimen collection/handling, submission of specimen other than nasopharyngeal swab, presence of viral mutation(s) within the areas targeted by this assay, and inadequate number of viral copies(<138 copies/mL). A negative result must be combined with clinical observations, patient history, and epidemiological information. The expected result is Negative.  Fact Sheet for Patients:  EntrepreneurPulse.com.au  Fact Sheet for Healthcare Providers:  IncredibleEmployment.be  This test is no t yet approved or cleared by the [place] FDA and  has been authorized for detection and/or diagnosis of SARS-CoV-2 by FDA under an Emergency Use Authorization (EUA). This EUA will remain  in effect (meaning this test can be used) for the duration of the COVID-19 declaration under Section 564(b)(1) of the Act, 21 U.S.C.section 360bbb-3(b)(1), unless the authorization is terminated  or revoked sooner.       Influenza A by PCR NEGATIVE NEGATIVE Final   Influenza B by PCR NEGATIVE NEGATIVE Final    Comment: (NOTE) The Xpert Xpress SARS-CoV-2/FLU/RSV plus assay is intended as an aid in the diagnosis of influenza from Nasopharyngeal swab specimens and should not be used as a sole basis for treatment. Nasal washings and aspirates are unacceptable for Xpert Xpress SARS-CoV-2/FLU/RSV testing.  Fact Sheet for Patients: EntrepreneurPulse.com.au  Fact Sheet for Healthcare  Providers: IncredibleEmployment.be  This test is not yet approved or cleared by the [place] FDA and has been authorized for detection and/or diagnosis of SARS-CoV-2 by FDA under an Emergency Use Authorization (EUA). This EUA will remain in effect (meaning this test can be used) for the duration of the COVID-19 declaration under Section 564(b)(1) of the Act, 21 U.S.C. section 360bbb-3(b)(1), unless the authorization is terminated or revoked.  Performed at [facility], [place] [address] [place], [place] [address]   Urine Culture     Status: None   Collection Time: 03/20/21 12:55 PM   Specimen: Urine, Clean Catch  Result Value Ref Range Status   Specimen Description   Final    URINE, CLEAN CATCH Performed at [facility], [place] [address] [place], [place] [address]    Special Requests   Final    NONE Performed at [facility], [place] [address], [place], [place] [address]    Culture   Final    NO GROWTH Performed at [facility], [place] [address] [place], [place] [address]    Report Status 03/22/2021 FINAL  Final  Resp Panel by RT-PCR (Flu A&B, Covid) Nasopharyngeal Swab     Status: None   Collection Time: 03/20/21  1:05 PM   Specimen: Nasopharyngeal Swab; Nasopharyngeal(NP) swabs in vial transport  medium  Result Value Ref Range Status   SARS Coronavirus 2 by RT PCR NEGATIVE NEGATIVE Final    Comment: (NOTE) SARS-CoV-2 target nucleic acids are NOT DETECTED.  The SARS-CoV-2 RNA is generally detectable in upper respiratory specimens during the acute phase of infection. The lowest concentration of SARS-CoV-2 viral copies this assay can detect is 138 copies/mL. A negative result does not preclude SARS-Cov-2 infection and should not be used as the sole basis for treatment or other patient management decisions. A negative result may occur with  improper specimen collection/handling, submission of specimen  other than nasopharyngeal swab, presence of viral mutation(s) within the areas targeted by this assay, and inadequate number of viral copies(<138 copies/mL). A negative result must be combined with clinical observations, patient history, and epidemiological information. The expected result is Negative.  Fact Sheet for Patients:  EntrepreneurPulse.com.au  Fact Sheet for Healthcare Providers:  IncredibleEmployment.be  This test is no t yet approved or cleared by the [place] FDA and  has been authorized for detection and/or diagnosis of SARS-CoV-2 by FDA under an Emergency Use Authorization (EUA). This EUA will remain  in effect (meaning this test can be used) for the duration of the COVID-19 declaration under Section 564(b)(1) of the Act, 21 U.S.C.section 360bbb-3(b)(1), unless the authorization is terminated  or revoked sooner.       Influenza A by PCR NEGATIVE NEGATIVE Final   Influenza B by PCR NEGATIVE NEGATIVE Final    Comment: (NOTE) The Xpert Xpress SARS-CoV-2/FLU/RSV plus assay is intended as an aid in the diagnosis of influenza from Nasopharyngeal swab specimens and should not be used as a sole basis for treatment. Nasal washings and aspirates are unacceptable for Xpert Xpress SARS-CoV-2/FLU/RSV testing.  Fact Sheet for Patients: EntrepreneurPulse.com.au  Fact Sheet for Healthcare Providers: IncredibleEmployment.be  This test is not yet approved or cleared by the [place] FDA and has been authorized for detection and/or diagnosis of SARS-CoV-2 by FDA under an Emergency Use Authorization (EUA). This EUA will remain in effect (meaning this test can be used) for the duration of the COVID-19 declaration under Section 564(b)(1) of the Act, 21 U.S.C. section 360bbb-3(b)(1), unless the authorization is terminated or revoked.  Performed at [facility], [place] [address], [place], [place] [address]      Labs: BNP (last 3 results) Recent Labs    05/25/20 1458 03/15/21 1722 03/20/21 1305  BNP 336.4* 131.1* [id number]   Basic Metabolic Panel: Recent Labs  Lab 03/17/21 0256 03/18/21 0304 03/19/21 0159 03/20/21 1305 03/22/21 0602  NA 135 135 133* 140 134*  K 3.9 3.7 4.6 4.2 3.7  CL 103 103 101 106 102  CO2 '23 24 25 26 25  '$ GLUCOSE 117* 100* 106* 103* 116*  BUN 37* '23 19 18 18  '$ CREATININE 1.24 1.10 1.08 1.01 0.95  CALCIUM 9.3 9.3 9.2 9.9 9.3   Liver Function Tests: Recent Labs  Lab 03/20/21 1305  AST 12*  ALT 9  ALKPHOS 94  BILITOT 0.5  PROT 6.6  ALBUMIN 2.9*   No results for input(s): LIPASE, AMYLASE in the last 168 hours. Recent Labs  Lab 03/17/21 0256 03/20/21 1305  AMMONIA 63* 29   CBC: Recent Labs  Lab 03/17/21 0256 03/18/21 0304 03/20/21 1305 03/22/21 0602  WBC 6.6 6.6 6.8 6.4  HGB 11.8* 11.4* 12.6* 12.0*  HCT 40.8 37.7* 42.0 39.0  MCV 98.1 92.4 94.8 91.5  PLT 185 185 169 137*   Cardiac Enzymes: No results for input(s):  CKTOTAL, CKMB, CKMBINDEX, TROPONINI in the last 168 hours. BNP: Invalid input(s): POCBNP CBG: Recent Labs  Lab 03/22/21 1210 03/22/21 1628 03/22/21 2105 03/23/21 0726 03/23/21 1132  GLUCAP 136* 189* 104* 123* 127*   D-Dimer No results for input(s): DDIMER in the last 72 hours. Hgb A1c No results for input(s): HGBA1C in the last 72 hours. Lipid Profile No results for input(s): CHOL, HDL, LDLCALC, TRIG, CHOLHDL, LDLDIRECT in the last 72 hours. Thyroid function studies No results for input(s): TSH, T4TOTAL, T3FREE, THYROIDAB in the last 72 hours.  Invalid input(s): FREET3 Anemia work up No results for input(s): VITAMINB12, FOLATE, FERRITIN, TIBC, IRON, RETICCTPCT in the last 72 hours. Urinalysis    Component Value Date/Time   COLORURINE AMBER (A) 03/20/2021 1255   APPEARANCEUR TURBID (A) 03/20/2021 1255   LABSPEC 1.013 03/20/2021 1255   PHURINE 7.0 03/20/2021 1255   GLUCOSEU  NEGATIVE 03/20/2021 1255   HGBUR MODERATE (A) 03/20/2021 1255   [place] 03/20/2021 1255   KETONESUR NEGATIVE 03/20/2021 1255   PROTEINUR 100 (A) 03/20/2021 1255   NITRITE NEGATIVE 03/20/2021 1255   LEUKOCYTESUR LARGE (A) 03/20/2021 1255   Sepsis Labs Invalid input(s): PROCALCITONIN,  WBC,  LACTICIDVEN Microbiology Recent Results (from the past 240 hour(s))  Urine Culture     Status: Abnormal   Collection Time: 03/15/21  3:21 PM   Specimen: In/Out Cath Urine  Result Value Ref Range Status   Specimen Description IN/OUT CATH URINE  Final   Special Requests   Final    NONE Performed at [facility], [place] [address], [place], [place] [address]    Culture >=100,000 COLONIES/mL ENTEROBACTER CLOACAE (A)  Final   Report Status 03/18/2021 FINAL  Final   Organism ID, Bacteria ENTEROBACTER CLOACAE (A)  Final      Susceptibility   Enterobacter cloacae - MIC*    CEFAZOLIN >=64 RESISTANT Resistant     CEFEPIME <=0.12 SENSITIVE Sensitive     CIPROFLOXACIN <=0.25 SENSITIVE Sensitive     GENTAMICIN <=1 SENSITIVE Sensitive     IMIPENEM 0.5 SENSITIVE Sensitive     NITROFURANTOIN 32 SENSITIVE Sensitive     TRIMETH/SULFA <=20 SENSITIVE Sensitive     PIP/TAZO <=4 SENSITIVE Sensitive     * >=100,000 COLONIES/mL ENTEROBACTER CLOACAE  Blood Culture (routine x 2)     Status: None   Collection Time: 03/15/21  3:35 PM   Specimen: BLOOD RIGHT HAND  Result Value Ref Range Status   Specimen Description BLOOD RIGHT HAND  Final   Special Requests   Final    BOTTLES DRAWN AEROBIC AND ANAEROBIC Blood Culture results may not be optimal due to an inadequate volume of blood received in culture bottles   Culture   Final    NO GROWTH 5 DAYS Performed at [facility], [place] [address], [place], [place] [address]    Report Status 03/20/2021 FINAL  Final  Blood Culture (routine x 2)     Status: None   Collection Time: 03/15/21  3:35 PM   Specimen: BLOOD LEFT HAND  Result Value Ref Range  Status   Specimen Description BLOOD LEFT HAND  Final   Special Requests   Final    BOTTLES DRAWN AEROBIC AND ANAEROBIC Blood Culture results may not be optimal due to an inadequate volume of blood received in culture bottles   Culture   Final    NO GROWTH 5 DAYS Performed at [facility], [place] [address] [place], [place] [address]  Report Status 03/20/2021 FINAL  Final  Resp Panel by RT-PCR (Flu A&B, Covid) Nasopharyngeal Swab     Status: None   Collection Time: 03/15/21  7:30 PM   Specimen: Nasopharyngeal Swab; Nasopharyngeal(NP) swabs in vial transport medium  Result Value Ref Range Status   SARS Coronavirus 2 by RT PCR NEGATIVE NEGATIVE Final    Comment: (NOTE) SARS-CoV-2 target nucleic acids are NOT DETECTED.  The SARS-CoV-2 RNA is generally detectable in upper respiratory specimens during the acute phase of infection. The lowest concentration of SARS-CoV-2 viral copies this assay can detect is 138 copies/mL. A negative result does not preclude SARS-Cov-2 infection and should not be used as the sole basis for treatment or other patient management decisions. A negative result may occur with  improper specimen collection/handling, submission of specimen other than nasopharyngeal swab, presence of viral mutation(s) within the areas targeted by this assay, and inadequate number of viral copies(<138 copies/mL). A negative result must be combined with clinical observations, patient history, and epidemiological information. The expected result is Negative.  Fact Sheet for Patients:  EntrepreneurPulse.com.au  Fact Sheet for Healthcare Providers:  IncredibleEmployment.be  This test is no t yet approved or cleared by the [place] FDA and  has been authorized for detection and/or diagnosis of SARS-CoV-2 by FDA under an Emergency Use Authorization (EUA). This EUA will remain  in effect (meaning this test can be used) for the duration of  the COVID-19 declaration under Section 564(b)(1) of the Act, 21 U.S.C.section 360bbb-3(b)(1), unless the authorization is terminated  or revoked sooner.       Influenza A by PCR NEGATIVE NEGATIVE Final   Influenza B by PCR NEGATIVE NEGATIVE Final    Comment: (NOTE) The Xpert Xpress SARS-CoV-2/FLU/RSV plus assay is intended as an aid in the diagnosis of influenza from Nasopharyngeal swab specimens and should not be used as a sole basis for treatment. Nasal washings and aspirates are unacceptable for Xpert Xpress SARS-CoV-2/FLU/RSV testing.  Fact Sheet for Patients: EntrepreneurPulse.com.au  Fact Sheet for Healthcare Providers: IncredibleEmployment.be  This test is not yet approved or cleared by the [place] FDA and has been authorized for detection and/or diagnosis of SARS-CoV-2 by FDA under an Emergency Use Authorization (EUA). This EUA will remain in effect (meaning this test can be used) for the duration of the COVID-19 declaration under Section 564(b)(1) of the Act, 21 U.S.C. section 360bbb-3(b)(1), unless the authorization is terminated or revoked.  Performed at [facility], [place] [address] [place], [place] [address]   Resp Panel by RT-PCR (Flu A&B, Covid) Nasopharyngeal Swab     Status: None   Collection Time: 03/18/21  4:14 PM   Specimen: Nasopharyngeal Swab; Nasopharyngeal(NP) swabs in vial transport medium  Result Value Ref Range Status   SARS Coronavirus 2 by RT PCR NEGATIVE NEGATIVE Final    Comment: (NOTE) SARS-CoV-2 target nucleic acids are NOT DETECTED.  The SARS-CoV-2 RNA is generally detectable in upper respiratory specimens during the acute phase of infection. The lowest concentration of SARS-CoV-2 viral copies this assay can detect is 138 copies/mL. A negative result does not preclude SARS-Cov-2 infection and should not be used as the sole basis for treatment or other patient management decisions. A negative  result may occur with  improper specimen collection/handling, submission of specimen other than nasopharyngeal swab, presence of viral mutation(s) within the areas targeted by this assay, and inadequate number of viral copies(<138 copies/mL). A negative result must be combined with clinical observations, patient history, and epidemiological  information. The expected result is Negative.  Fact Sheet for Patients:  EntrepreneurPulse.com.au  Fact Sheet for Healthcare Providers:  IncredibleEmployment.be  This test is no t yet approved or cleared by the [place] FDA and  has been authorized for detection and/or diagnosis of SARS-CoV-2 by FDA under an Emergency Use Authorization (EUA). This EUA will remain  in effect (meaning this test can be used) for the duration of the COVID-19 declaration under Section 564(b)(1) of the Act, 21 U.S.C.section 360bbb-3(b)(1), unless the authorization is terminated  or revoked sooner.       Influenza A by PCR NEGATIVE NEGATIVE Final   Influenza B by PCR NEGATIVE NEGATIVE Final    Comment: (NOTE) The Xpert Xpress SARS-CoV-2/FLU/RSV plus assay is intended as an aid in the diagnosis of influenza from Nasopharyngeal swab specimens and should not be used as a sole basis for treatment. Nasal washings and aspirates are unacceptable for Xpert Xpress SARS-CoV-2/FLU/RSV testing.  Fact Sheet for Patients: EntrepreneurPulse.com.au  Fact Sheet for Healthcare Providers: IncredibleEmployment.be  This test is not yet approved or cleared by the [place] FDA and has been authorized for detection and/or diagnosis of SARS-CoV-2 by FDA under an Emergency Use Authorization (EUA). This EUA will remain in effect (meaning this test can be used) for the duration of the COVID-19 declaration under Section 564(b)(1) of the Act, 21 U.S.C. section 360bbb-3(b)(1), unless the authorization is  terminated or revoked.  Performed at [facility], [place] [address] [place], [place] [address]   Urine Culture     Status: None   Collection Time: 03/20/21 12:55 PM   Specimen: Urine, Clean Catch  Result Value Ref Range Status   Specimen Description   Final    URINE, CLEAN CATCH Performed at [facility], [place] [address] [place], [place] [address]    Special Requests   Final    NONE Performed at [facility], [place] [address], [place], [place] [address]    Culture   Final    NO GROWTH Performed at [facility], [place] [address] [place], [place] [address]    Report Status 03/22/2021 FINAL  Final  Resp Panel by RT-PCR (Flu A&B, Covid) Nasopharyngeal Swab     Status: None   Collection Time: 03/20/21  1:05 PM   Specimen: Nasopharyngeal Swab; Nasopharyngeal(NP) swabs in vial transport medium  Result Value Ref Range Status   SARS Coronavirus 2 by RT PCR NEGATIVE NEGATIVE Final    Comment: (NOTE) SARS-CoV-2 target nucleic acids are NOT DETECTED.  The SARS-CoV-2 RNA is generally detectable in upper respiratory specimens during the acute phase of infection. The lowest concentration of SARS-CoV-2 viral copies this assay can detect is 138 copies/mL. A negative result does not preclude SARS-Cov-2 infection and should not be used as the sole basis for treatment or other patient management decisions. A negative result may occur with  improper specimen collection/handling, submission of specimen other than nasopharyngeal swab, presence of viral mutation(s) within the areas targeted by this assay, and inadequate number of viral copies(<138 copies/mL). A negative result must be combined with clinical observations, patient history, and epidemiological information. The expected result is Negative.  Fact Sheet for Patients:  EntrepreneurPulse.com.au  Fact Sheet for Healthcare Providers:   IncredibleEmployment.be  This test is no t yet approved or cleared by the [place] FDA and  has been authorized for detection and/or diagnosis of SARS-CoV-2 by FDA under an Emergency Use Authorization (EUA). This EUA will remain  in effect (meaning this test can be used) for the duration of the COVID-19 declaration under Section 564(b)(1) of the Act, 21 U.S.C.section 360bbb-3(b)(1), unless the authorization is terminated  or revoked sooner.       Influenza A by PCR NEGATIVE NEGATIVE Final   Influenza B by PCR NEGATIVE NEGATIVE Final    Comment: (NOTE) The Xpert Xpress SARS-CoV-2/FLU/RSV plus assay is intended as an aid in the diagnosis of influenza from Nasopharyngeal swab specimens and should not be used as a sole basis for treatment. Nasal washings and aspirates are unacceptable for Xpert Xpress SARS-CoV-2/FLU/RSV testing.  Fact Sheet for Patients: EntrepreneurPulse.com.au  Fact Sheet for Healthcare Providers: IncredibleEmployment.be  This test is not yet approved or cleared by the [place] FDA and has been authorized for detection and/or diagnosis of SARS-CoV-2 by FDA under an Emergency Use Authorization (EUA). This EUA will remain in effect (meaning this test can be used) for the duration of the COVID-19 declaration under Section 564(b)(1) of the Act, 21 U.S.C. section 360bbb-3(b)(1), unless the authorization is terminated or revoked.  Performed at [facility], [place] [address], [place], [place] [address]      Patient was seen and examined on the day of discharge and was found to be in stable condition. Time coordinating discharge: 35 minutes including assessment and coordination of care, as well as examination of the patient.   SIGNED:  Dessa Phi, DO [facility] 03/23/2021, 12:00 PM

## 2021-03-23 NOTE — TOC Progression Note (Signed)
Transition of Care [facility]) - Progression Note    Patient Details  Name: Jawaad Valle MRN: [id number] Date of Birth: [birth year]  Transition of Care [facility]) CM/SW Contact  Alacia Rehmann, Marjie Skiff, RN Phone Number: 03/23/2021, 1:31 PM  Clinical Narrative:    Per MD pt is medically ready to go back to [facility] today. Voice mail left on liaison cell phone and on the facility office phone. Will await return call.    Expected Discharge Plan: [place] Barriers to Discharge: Continued Medical Work up  Expected Discharge Plan and Services Expected Discharge Plan: [place]   Discharge Planning Services: CM Consult Post Acute Care Choice: [place] Living arrangements for the past 2 months: [place] Expected Discharge Date: 03/23/21                     Readmission Risk Interventions Readmission Risk Prevention Plan 03/21/2021 10/06/2019 10/06/2019  Transportation Screening Complete - Complete  PCP or Specialist Appt within 3-5 Days Complete Complete -  HRI or Home Care Consult Complete - Complete  Social Work Consult for Recovery Care Planning/Counseling Complete - Complete  Palliative Care Screening Complete - Not Applicable  Medication Review Press photographer) Complete - Complete

## 2021-03-23 NOTE — NC FL2 (Signed)
[place] LEVEL OF CARE SCREENING TOOL     IDENTIFICATION  Patient Name: Brandon [name] Birthdate: 03-02-1946 Sex: male Admission Date (Current Location): 03/20/2021  [facility] and [place] Number:  Herbalist and Address:  [facility],  [address] [place], [place]      Provider Number: [id number]  Attending Physician Name and Address:  Dessa Phi, DO  Relative Name and Phone Number:       Current Level of Care: Hospital Recommended Level of Care: [place] Prior Approval Number:    Date Approved/Denied:   PASRR Number: [id number]  Discharge Plan: SNF    Current Diagnoses: Patient Active Problem List   Diagnosis Date Noted   Acute metabolic encephalopathy [id number]   Pressure injury of skin 03/16/2021   UTI (urinary tract infection) 03/16/2021   Generalized weakness 03/15/2021   Disp supracondyl fx w/o intracondylar extension of right distal femur ([place]) 02/05/2021   Hypomagnesemia 02/04/2021   Fall at home, initial encounter 02/04/2021   Cirrhosis of liver ([place])    Chronic heart failure with preserved ejection fraction (HFpEF) ([place]) 06/07/2020   Hyperkalemia 06/07/2020   Weakness generalized 06/07/2020   Physical deconditioning 06/06/2020   Hypothermia 05/31/2020   Bullous pemphigoid [id number]   Alcoholic cirrhosis of liver with ascites ([place])    Portal hypertensive gastropathy ([place])    Melena 10/02/2019   Angiodysplasia of stomach with hemorrhage    Gastrointestinal hemorrhage    Atrial fibrillation (HCC)    Chronic pain syndrome    Symptomatic anemia 08/13/2019   Acute upper GI bleed 08/13/2019   Anemia 07/2019   Avulsion of right patellar tendon    Closed displaced fracture of phalanx of toe of right foot 10/06/2018   Obesity BMI over 52 09/30/2018   Patella fracture, extensor mechanism disruption R knee  09/29/2018   History of colon cancer 08/23/2018   CAD (coronary artery disease) 08/23/2018    DM (diabetes mellitus), type 2 ([place]) 08/23/2018   Pyuria 08/23/2018    Orientation RESPIRATION BLADDER Height & Weight     Self, Time, Situation, Place  Normal Incontinent Weight: 134.4 kg Height:  '5\' 6"'$  (167.6 cm)  BEHAVIORAL SYMPTOMS/MOOD NEUROLOGICAL BOWEL NUTRITION STATUS      Continent Diet (Heart Healthy)  AMBULATORY STATUS COMMUNICATION OF NEEDS Skin   Extensive Assist Verbally Bruising, Skin abrasions                       Personal Care Assistance Level of Assistance  Bathing, Feeding, Dressing Bathing Assistance: Maximum assistance Feeding assistance: Limited assistance Dressing Assistance: Limited assistance     Functional Limitations Info  Sight, Hearing, Speech Sight Info: Adequate Hearing Info: Adequate Speech Info: Adequate    SPECIAL CARE FACTORS FREQUENCY  PT (By licensed PT), OT (By licensed OT)     PT Frequency: 5 x weekly OT Frequency: 5 x weekly            Contractures Contractures Info: Not present    Additional Factors Info  Code Status Code Status Info: DNR Allergies Info: None known           Current Medications (03/23/2021):  This is the current hospital active medication list Current Facility-Administered Medications  Medication Dose Route Frequency Provider Last Rate Last Admin   acetaminophen (TYLENOL) tablet 650 mg  650 mg Oral Q6H PRN Rai, Ripudeep K, MD       Or   acetaminophen (TYLENOL) suppository 650 mg  650 mg Rectal Q6H PRN Rai, Ripudeep K, MD       carvedilol (COREG) tablet 25 mg  25 mg Oral BID WC Rai, Ripudeep K, MD   25 mg at 03/23/21 [id number]   Chlorhexidine Gluconate Cloth 2 % PADS 6 each  6 each Topical Daily Dessa Phi, DO   6 each at 03/23/21 1106   diphenhydrAMINE (BENADRYL) capsule 50 mg  50 mg Oral Q6H PRN Chotiner, Yevonne Aline, MD       enoxaparin (LOVENOX) injection 70 mg  70 mg Subcutaneous QHS Rai, Ripudeep K, MD   70 mg at 03/22/21 2116   finasteride (PROSCAR) tablet 5 mg  5 mg Oral Daily Rai,  Ripudeep K, MD   5 mg at 03/23/21 1008   furosemide (LASIX) tablet 20 mg  20 mg Oral BID Rai, Ripudeep K, MD   20 mg at 03/23/21 [id number]   gabapentin (NEURONTIN) capsule 300 mg  300 mg Oral TID Rai, Ripudeep K, MD   300 mg at 03/23/21 1009   insulin aspart (novoLOG) injection 0-15 Units  0-15 Units Subcutaneous TID WC Rai, Ripudeep K, MD   2 Units at 03/23/21 1213   insulin aspart (novoLOG) injection 0-5 Units  0-5 Units Subcutaneous QHS Rai, Ripudeep K, MD       lactulose (CHRONULAC) 10 GM/15ML solution 20 g  20 g Oral Daily Rai, Ripudeep K, MD   20 g at 03/23/21 1010   nystatin ointment (MYCOSTATIN)   Topical BID Rai, Ripudeep K, MD   1 application at [id number]   ondansetron (ZOFRAN) tablet 4 mg  4 mg Oral Q6H PRN Rai, Ripudeep K, MD       Or   ondansetron (ZOFRAN) injection 4 mg  4 mg Intravenous Q6H PRN Rai, Ripudeep K, MD       pantoprazole (PROTONIX) EC tablet 40 mg  40 mg Oral Daily Rai, Ripudeep K, MD   40 mg at 03/23/21 1008   pravastatin (PRAVACHOL) tablet 10 mg  10 mg Oral QHS Rai, Ripudeep K, MD   10 mg at 03/22/21 2111   spironolactone (ALDACTONE) tablet 50 mg  50 mg Oral Daily Dessa Phi, DO   50 mg at 03/23/21 1010   sulfamethoxazole-trimethoprim (BACTRIM DS) 800-160 MG per tablet 1 tablet  1 tablet Oral Q12H Dessa Phi, DO   1 tablet at 03/23/21 1009   tamsulosin (FLOMAX) capsule 0.8 mg  0.8 mg Oral QHS Dessa Phi, DO   0.8 mg at 03/22/21 2111     Discharge Medications: Please see discharge summary for a list of discharge medications.  Relevant Imaging Results:  Relevant Lab Results:   Additional Information SSI [id number]. Pf[employer] 02/12/20, 03/05/20  Joannah Gitlin, Marjie Skiff, RN

## 2021-03-23 NOTE — Progress Notes (Signed)
Report received from A. Dickey,RN. No change in assessment. Will continue plan of care. Darcy Barbara Johnson, RN  

## 2021-03-23 NOTE — Plan of Care (Signed)
  Problem: Activity: Goal: Risk for activity intolerance will decrease Outcome: Progressing   Problem: Pain Managment: Goal: General experience of comfort will improve Outcome: Progressing   Problem: Skin Integrity: Goal: Risk for impaired skin integrity will decrease Outcome: Progressing   

## 2021-03-24 LAB — GLUCOSE, CAPILLARY
Glucose-Capillary: 130 mg/dL — ABNORMAL HIGH (ref 70–99)
Glucose-Capillary: 128 mg/dL — ABNORMAL HIGH (ref 70–99)

## 2021-03-24 MED ORDER — SERTRALINE HCL 25 MG PO TABS: 25.0000 mg | ORAL_TABLET | Freq: Every day | ORAL | 2 refills | Status: DC

## 2021-03-24 MED ORDER — POLYETHYLENE GLYCOL 3350 17 G PO PACK: 17.0000 g | PACK | Freq: Every day | ORAL | Status: DC

## 2021-03-24 NOTE — Progress Notes (Signed)
  PROGRESS NOTE  Patient feeling well, resting in bed with breakfast tray. Asking for bowel regimen, hasn't had a BM in 4 days. Awaiting SNF to reply to take patient back to facility. Medically stable for discharge since 8/27.   Dessa Phi, DO [facility] 03/24/2021, 8:56 AM  Available via Epic secure chat 7am-7pm After these hours, please refer to coverage provider listed on [email or web address]

## 2021-03-24 NOTE — TOC Transition Note (Signed)
Transition of Care [facility]) - CM/SW Discharge Note   Patient Details  Name: Brandon [name] MRN: [id number] Date of Birth: 12/07/1945  Transition of Care [facility]) CM/SW Contact:  Lynnell Catalan, RN Phone Number: 03/24/2021, 10:59 AM   Clinical Narrative:    Pt to dc back to [facility] today. Auth received from [facility] [id number]. RN to call report to [phone]. Son [name] called to alert of dc to SNF. PT[facility] to transport pt to [facility]. Yellow DNR to be sent to facility with pt.   Final next level of care: [place] Barriers to Discharge: Continued Medical Work up   Patient Goals and CMS Choice Patient states their goals for this hospitalization and ongoing recovery are:: return back to [facility] CMS Medicare.gov Compare Post Acute Care list provided to:: Patient Choice offered to / list presented to : Adult Children   Discharge Plan and Services   Discharge Planning Services: CM Consult Post Acute Care Choice: [place]               Readmission Risk Interventions Readmission Risk Prevention Plan 03/21/2021 10/06/2019 10/06/2019  Transportation Screening Complete - Complete  PCP or Specialist Appt within 3-5 Days Complete Complete -  HRI or Home Care Consult Complete - Complete  Social Work Consult for Recovery Care Planning/Counseling Complete - Complete  Palliative Care Screening Complete - Not Applicable  Medication Review Press photographer) Complete - Complete

## 2021-03-24 NOTE — Progress Notes (Signed)
Pt going to [facility] facility today. Attempted to call report to [phone]

## 2021-03-24 NOTE — Progress Notes (Signed)
April, nurse assuming care of patient report given. Question answered. PT stable for d/c awaiting arrival of PTAR. No changes in initial am assessment at this time.

## 2021-03-24 NOTE — Progress Notes (Signed)
Daily Progress Note   Patient Name: Brandon [name]       Date: 03/24/2021 DOB: 1945/12/26  Age: 75 y.o. MRN#: [id number] Attending Physician: Dessa Phi, DO Primary Care Physician: Helane Rima, MD Admit Date: 03/20/2021  Reason for Consultation/Follow-up: Establishing goals of care  Subjective:  I saw and examined Brandon [name] today.  He denies specific complaints and reports being ready to transition back to SNF.  I called and discussed with his son, Brandon [name].  Brandon [name] and I again discussed concern that Mr. Ridenbaugh remains very deconditioned with multiple medical problems.  Discussed concern that may not be able to rehab to the point of returning to independent living and is at high risk of continued decline with multiple comorbidities and poor functional status.  We discussed plan to transition back to rehab as they have previously been arranging.  If he does well at rehab and continues to thrive, I encouraged they continue with this plan. If, however he is unable to regain function and he continues to decline, I recommended that he be followed by outpatient palliative care to determine if he may be better served by focusing his care on staying out of the hospital with support of organization such as hospice.  Length of Stay: 4  Current Medications: Scheduled Meds:  . carvedilol  25 mg Oral BID WC  . Chlorhexidine Gluconate Cloth  6 each Topical Daily  . enoxaparin  70 mg Subcutaneous QHS  . finasteride  5 mg Oral Daily  . furosemide  20 mg Oral BID  . gabapentin  300 mg Oral TID  . insulin aspart  0-15 Units Subcutaneous TID WC  . insulin aspart  0-5 Units Subcutaneous QHS  . lactulose  20 g Oral Daily  . nystatin ointment   Topical BID  . pantoprazole  40 mg Oral Daily  .  polyethylene glycol  17 g Oral Daily  . pravastatin  10 mg Oral QHS  . sertraline  25 mg Oral Daily  . spironolactone  50 mg Oral Daily  . sulfamethoxazole-trimethoprim  1 tablet Oral Q12H  . tamsulosin  0.8 mg Oral QHS    Continuous Infusions:   PRN Meds: acetaminophen **OR** acetaminophen, diphenhydrAMINE, ondansetron **OR** ondansetron (ZOFRAN) IV  Physical Exam        General: Alert, awake, in no acute  distress.   HEENT: No bruits, no goiter, no JVD Heart: Regular rate and rhythm. No murmur appreciated. Lungs: Good air movement, clear Abdomen: Soft, nontender, nondistended, positive bowel sounds.   Ext: No significant edema Skin: Warm and dry Neuro: Grossly intact, nonfocal.   Vital Signs: BP (!) 150/96   Pulse 69   Temp (!) 97.5 F (36.4 C) (Oral)   Resp 19   Ht '5\' 6"'$  (1.676 m)   Wt 134.4 kg   SpO2 96%   BMI 47.82 kg/m  SpO2: SpO2: 96 % O2 Device: O2 Device: Room Air O2 Flow Rate: O2 Flow Rate (L/min): 0 L/min  Intake/output summary:  Intake/Output Summary (Last 24 hours) at 03/24/2021 [id number] Last data filed at 03/24/2021 [id number] Gross per 24 hour  Intake 1080 ml  Output 2650 ml  Net -1570 ml   LBM: Last BM Date: 03/21/21 Baseline Weight: Weight: 136.1 kg Most recent weight: Weight: 134.4 kg       Palliative Assessment/Data:    Flowsheet Rows    Flowsheet Row Most Recent Value  Intake Tab   Referral Department Hospitalist  Unit at Time of Referral Med/Surg Unit  Palliative Care Primary Diagnosis Sepsis/Infectious Disease  Date Notified 03/20/21  Palliative Care Type New Palliative care  Reason for referral Clarify Goals of Care  Date of Admission 03/20/21  Date first seen by Palliative Care 03/22/21  # of days Palliative referral response time 2 Day(s)  # of days IP prior to Palliative referral 0  Clinical Assessment   Palliative Performance Scale Score 50%  Psychosocial & Spiritual Assessment   Palliative Care Outcomes   Patient/Family meeting  held? Yes  Who was at the meeting? Patient, son via phone       Patient Active Problem List   Diagnosis Date Noted  . Acute metabolic encephalopathy [id number]  . Pressure injury of skin 03/16/2021  . UTI (urinary tract infection) 03/16/2021  . Generalized weakness 03/15/2021  . Disp supracondyl fx w/o intracondylar extension of right distal femur ([place]) 02/05/2021  . Hypomagnesemia 02/04/2021  . Fall at home, initial encounter 02/04/2021  . Cirrhosis of liver ([place])   . Chronic heart failure with preserved ejection fraction (HFpEF) ([place]) 06/07/2020  . Hyperkalemia 06/07/2020  . Weakness generalized 06/07/2020  . Physical deconditioning 06/06/2020  . Hypothermia 05/31/2020  . 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 Alcoholic cirrhosis of liver with ascites ([place])   . Portal hypertensive gastropathy ([place])   . Melena 10/02/2019  . Angiodysplasia of stomach with hemorrhage   . Gastrointestinal hemorrhage   . Atrial fibrillation ([place])   . Chronic pain syndrome   . Symptomatic anemia 08/13/2019  . Acute upper GI bleed 08/13/2019  . Anemia 07/2019  . Avulsion of right patellar tendon   . Closed displaced fracture of phalanx of toe of right foot 10/06/2018  . 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 Patella fracture, extensor mechanism disruption R knee  09/29/2018  . History of colon cancer 08/23/2018  . CAD (coronary artery disease) 08/23/2018  . DM (diabetes mellitus), type 2 ([place]) 08/23/2018  . Pyuria 08/23/2018    Palliative Care Assessment & Plan   Patient Profile: 75 y.o. male  with past medical history of A. fib, cirrhosis, colon cancer, CAD, diabetes, obesity, BPH, right distal femur repair approximately 5 weeks ago admitted on 03/20/2021 with UTI.  He had a recent admission 8/19-8/23 and was discharged on oral ciprofloxacin.  He was sent back from facility due to being more confused than usual  and was noted to have "chocolate milk" colored urine.  Palliative consulted for goals of  care.  Recommendations/Plan: DNR/DNI Plan to return to skilled facility to continue with rehab. I discussed long-term goals with patient's son.  I recommended palliative care to continue to follow him as an outpatient to assist with further goals of care discussion based on his clinical course of the next few weeks.   Code Status:    Code Status Orders  (From admission, onward)           Start     Ordered   03/21/21 0712  Do not attempt resuscitation (DNR)  Continuous       Question Answer Comment  In the event of cardiac or respiratory ARREST Do not call a "code blue"   In the event of cardiac or respiratory ARREST Do not perform Intubation, CPR, defibrillation or ACLS   In the event of cardiac or respiratory ARREST Use medication by any route, position, wound care, and other measures to relive pain and suffering. May use oxygen, suction and manual treatment of airway obstruction as needed for comfort.      03/21/21 0711           Code Status History     Date Active Date Inactive Code Status Order ID Comments User Context   03/20/2021 2201 03/21/2021 0711 Full Code [id number]  Mendel Corning, MD ED   03/20/2021 1950 03/20/2021 2201 DNR [id number]  Chotiner, Yevonne Aline, MD ED   03/15/2021 1723 03/19/2021 1934 DNR [id number]  Shawna Clamp, MD ED   03/15/2021 1723 03/15/2021 1723 Full Code [id number]  Shawna Clamp, MD ED   03/15/2021 1722 03/15/2021 1723 Full Code [id number]  Shawna Clamp, MD ED   02/03/2021 2247 02/12/2021 0206 DNR [id number]  Rhetta Mura, DO ED   06/06/2020 0652 06/08/2020 1854 DNR [id number]  Jean Rosenthal, MD ED   05/25/2020 1829 06/04/2020 2011 DNR [id number]  Madalyn Rob, MD ED   10/02/2019 2126 10/06/2019 2143 DNR [id number]  Bennie Pierini, MD Inpatient   09/16/2019 0831 09/19/2019 2047 DNR [id number]  Kathi Ludwig, MD ED   08/13/2019 1922 08/16/2019 1729 DNR [id number]  Bonnita Hollow, MD ED   08/13/2019 1922 08/13/2019 1922 Partial Code [id number]   Bonnita Hollow, MD ED   08/13/2019 1922 08/13/2019 1922 DNR [id number]  Bonnita Hollow, MD ED   09/29/2018 2250 10/08/2018 2301 DNR [id number]  Benay Pike, MD ED   08/23/2018 1929 08/25/2018 2043 DNR [id number]  Toy Baker, MD Inpatient       Prognosis: Guarded  Discharge Planning: [place] for rehab with Palliative care service follow-up  Care plan was discussed with patient, patient's son  Thank you for allowing the Palliative Medicine Team to assist in the care of this patient.   Total Time 25 Prolonged Time Billed No      Greater than 50%  of this time was spent counseling and coordinating care related to the above assessment and plan.  Micheline Rough, MD  Please contact Palliative Medicine Team phone at [phone] for questions and concerns.

## 2021-03-27 LAB — GLUCOSE, CAPILLARY: Glucose-Capillary: 115 mg/dL — ABNORMAL HIGH (ref 70–99)

## 2021-07-23 ENCOUNTER — Encounter (HOSPITAL_COMMUNITY): Payer: Self-pay

## 2021-07-23 ENCOUNTER — Other Ambulatory Visit: Payer: Self-pay

## 2021-07-23 ENCOUNTER — Emergency Department (HOSPITAL_COMMUNITY)
Admission: EM | Admit: 2021-07-23 | Discharge: 2021-07-24 | Disposition: A | Discharge status: Home / Self Care | Payer: Medicare Other | Attending: Emergency Medicine | Admitting: Emergency Medicine

## 2021-07-23 DIAGNOSIS — R339 Retention of urine, unspecified: Secondary | ICD-10-CM | POA: Diagnosis not present

## 2021-07-23 DIAGNOSIS — N179 Acute kidney failure, unspecified: Secondary | ICD-10-CM | POA: Insufficient documentation

## 2021-07-23 DIAGNOSIS — R319 Hematuria, unspecified: Secondary | ICD-10-CM | POA: Diagnosis present

## 2021-07-23 DIAGNOSIS — Z951 Presence of aortocoronary bypass graft: Secondary | ICD-10-CM | POA: Diagnosis not present

## 2021-07-23 DIAGNOSIS — Z87891 Personal history of nicotine dependence: Secondary | ICD-10-CM | POA: Diagnosis not present

## 2021-07-23 DIAGNOSIS — I110 Hypertensive heart disease with heart failure: Secondary | ICD-10-CM | POA: Diagnosis not present

## 2021-07-23 DIAGNOSIS — R310 Gross hematuria: Secondary | ICD-10-CM | POA: Diagnosis not present

## 2021-07-23 DIAGNOSIS — E119 Type 2 diabetes mellitus without complications: Secondary | ICD-10-CM | POA: Diagnosis not present

## 2021-07-23 DIAGNOSIS — I509 Heart failure, unspecified: Secondary | ICD-10-CM | POA: Insufficient documentation

## 2021-07-23 DIAGNOSIS — Z85038 Personal history of other malignant neoplasm of large intestine: Secondary | ICD-10-CM | POA: Insufficient documentation

## 2021-07-23 DIAGNOSIS — I2510 Atherosclerotic heart disease of native coronary artery without angina pectoris: Secondary | ICD-10-CM | POA: Insufficient documentation

## 2021-07-23 DIAGNOSIS — Z79899 Other long term (current) drug therapy: Secondary | ICD-10-CM | POA: Insufficient documentation

## 2021-07-23 DIAGNOSIS — Z7984 Long term (current) use of oral hypoglycemic drugs: Secondary | ICD-10-CM | POA: Insufficient documentation

## 2021-07-23 LAB — BASIC METABOLIC PANEL
Anion gap: 12 (ref 5–15)
BUN: 86 mg/dL — ABNORMAL HIGH (ref 8–23)
CO2: 25 mmol/L (ref 22–32)
Calcium: 9.3 mg/dL (ref 8.9–10.3)
Chloride: 95 mmol/L — ABNORMAL LOW (ref 98–111)
Creatinine, Ser: 2.54 mg/dL — ABNORMAL HIGH (ref 0.61–1.24)
GFR, Estimated: 26 mL/min — ABNORMAL LOW (ref >60)
Glucose, Bld: 173 mg/dL — ABNORMAL HIGH (ref 70–99)
Potassium: 5.3 mmol/L — ABNORMAL HIGH (ref 3.5–5.1)
Sodium: 132 mmol/L — ABNORMAL LOW (ref 135–145)

## 2021-07-23 LAB — CBC WITH DIFFERENTIAL/PLATELET
Abs Immature Granulocytes: 0.44 10*3/uL — ABNORMAL HIGH (ref 0.00–0.07)
Basophils Absolute: 0.1 10*3/uL (ref 0.0–0.1)
Basophils Relative: 0 %
Eosinophils Absolute: 0 10*3/uL (ref 0.0–0.5)
Eosinophils Relative: 0 %
HCT: 40.4 % (ref 39.0–52.0)
Hemoglobin: 12.4 g/dL — ABNORMAL LOW (ref 13.0–17.0)
Immature Granulocytes: 2 %
Lymphocytes Relative: 1 %
Lymphs Abs: 0.2 10*3/uL — ABNORMAL LOW (ref 0.7–4.0)
MCH: 27.5 pg (ref 26.0–34.0)
MCHC: 30.7 g/dL (ref 30.0–36.0)
MCV: 89.6 fL (ref 80.0–100.0)
Monocytes Absolute: 1 10*3/uL (ref 0.1–1.0)
Monocytes Relative: 5 %
Neutro Abs: 19.1 10*3/uL — ABNORMAL HIGH (ref 1.7–7.7)
Neutrophils Relative %: 92 %
Platelets: 134 10*3/uL — ABNORMAL LOW (ref 150–400)
RBC: 4.51 MIL/uL (ref 4.22–5.81)
RDW: 15.7 % — ABNORMAL HIGH (ref 11.5–15.5)
WBC: 20.7 10*3/uL — ABNORMAL HIGH (ref 4.0–10.5)
nRBC: 0 % (ref 0.0–0.2)

## 2021-07-23 NOTE — ED Triage Notes (Signed)
Pt to er room number 7 via ems, per ems pt is from [place], states that last night his foley bag was dropped and it pulled on his catheter, states that he is here today because he is having bright red bleeding in his foley bag, pt states that he is on blood thinners and has some burning in his penis, pt has dark red liquid in foley bag, pt has foley cath present.

## 2021-07-23 NOTE — ED Notes (Signed)
Pt in bed with eyes closed, pt satting 85 on room air, pt placed on 2L via [place], re positioned pt.

## 2021-07-23 NOTE — ED Notes (Signed)
Pt placed on 2L via [place] secondary to O2 sat in the 90s

## 2021-07-23 NOTE — ED Notes (Signed)
Pt in bed, pt states that he has not stood since he broke his leg and started rehab at [facility], pt currently bed bound, pt denies pain, pt O2 sat 88-94, resps even and unlabored, pt denies sob.  MD notified

## 2021-07-23 NOTE — ED Provider Notes (Signed)
[facility] DEPT Provider Note   CSN: [id number] Arrival date & time: 07/23/21  1213     History Chief Complaint  Patient presents with   Hematuria    Brandon [name] is a 75 y.o. male.  Patient is a 75 year old male with a history of atrial fibrillation on anticoagulation, cirrhosis of the liver, anemia, gastric AVMs, hypertension and a chronic indwelling Foley catheter who is presenting today from his facility due to blood in his Foley catheter bag.  He reports that yesterday his bag was dropped and jerked very hard causing significant pain at his penis.  Later that afternoon he started noticing blood in his urine.  It has been persistent and he has had some pressure and discomfort in his bladder area.  He denies any nausea or vomiting.  He has not had any fever.  They have not replaced the catheter.  He reports that he has not had any trouble until the catheter was dropped yesterday.  The history is provided by the patient.  Hematuria      Past Medical History:  Diagnosis Date   Anemia 07/2019   Hb 6.7 >> 3 PRBCs >> 8.8 in 07/2019.      Atrial fibrillation ([place])    Cholelithiasis    Cirrhosis of liver ([place])    Presumed due to EtOH as well as fatty liver from morbid obesity.  Cirrhosis evident on CT scan 07/2018 but finding overlooked and formal diagnoses not made until 07/2019   Colon cancer [facility]) 1999   Partial colectomy   Coronary artery disease    Diabetes mellitus without complication ([place])    Diverticulosis    Esophageal varices ([place])    Gastric AVM    Gastric hemorrhage due to angiodysplasia of stomach 07/2019   Obesity 07/2019   Portal hypertension ([place])    Tubular adenoma of colon     Patient Active Problem List   Diagnosis Date Noted   Acute metabolic encephalopathy 16/38/4536   Pressure injury of skin 03/16/2021   UTI (urinary tract infection) 03/16/2021   Generalized weakness 03/15/2021   Disp supracondyl fx w/o intracondylar  extension of right distal femur ([place]) 02/05/2021   Hypomagnesemia 02/04/2021   Fall at home, initial encounter 02/04/2021   Cirrhosis of liver (HCC)    Chronic heart failure with preserved ejection fraction (HFpEF) ([place]) 06/07/2020   Hyperkalemia 06/07/2020   Weakness generalized 06/07/2020   Physical deconditioning 06/06/2020   Hypothermia 05/31/2020   Bullous pemphigoid 46/80/3212   Alcoholic cirrhosis of liver with ascites ([place])    Portal hypertensive gastropathy ([place])    Melena 10/02/2019   Angiodysplasia of stomach with hemorrhage    Gastrointestinal hemorrhage    Atrial fibrillation (HCC)    Chronic pain syndrome    Symptomatic anemia 08/13/2019   Acute upper GI bleed 08/13/2019   Anemia 07/2019   Avulsion of right patellar tendon    Closed displaced fracture of phalanx of toe of right foot 10/06/2018   Obesity BMI over 52 09/30/2018   Patella fracture, extensor mechanism disruption R knee  09/29/2018   History of colon cancer 08/23/2018   CAD (coronary artery disease) 08/23/2018   DM (diabetes mellitus), type 2 ([place]) 08/23/2018   Pyuria 08/23/2018    Past Surgical History:  Procedure Laterality Date   APPLICATION OF WOUND VAC Right 09/30/2018   Procedure: Application Of Wound Vac;  Surgeon: Altamese Ruston, MD;  Location: [place];  Service: Orthopedics;  Laterality: Right;   COLONOSCOPY  2010   Removed 2 polyps of unclear type.  Not performed in [place] N/A 09/17/2019   Procedure: COLONOSCOPY WITH PROPOFOL;  Surgeon: [name], MD;  Location: [place];  Service: Endoscopy;  Laterality: N/A;   CORONARY ARTERY BYPASS GRAFT     in his 16s   ESOPHAGOGASTRODUODENOSCOPY (EGD) WITH PROPOFOL N/A 08/14/2019   Procedure: ESOPHAGOGASTRODUODENOSCOPY (EGD) WITH PROPOFOL;  Surgeon: [name], MD;  Location: [place];  Service: Gastroenterology;  Laterality: N/A;   ESOPHAGOGASTRODUODENOSCOPY (EGD) WITH PROPOFOL N/A 09/17/2019   Procedure:  ESOPHAGOGASTRODUODENOSCOPY (EGD) WITH PROPOFOL;  Surgeon: [name], MD;  Location: [place];  Service: Endoscopy;  Laterality: N/A;   ESOPHAGOGASTRODUODENOSCOPY (EGD) WITH PROPOFOL N/A 10/04/2019   Procedure: ESOPHAGOGASTRODUODENOSCOPY (EGD) WITH PROPOFOL;  Surgeon: Mauri Pole, MD;  Location: [facility] ENDOSCOPY;  Service: Endoscopy;  Laterality: N/A;   GIVENS CAPSULE STUDY N/A 09/17/2019   Procedure: GIVENS CAPSULE STUDY;  Surgeon: [name], MD;  Location: [place];  Service: Endoscopy;  Laterality: N/A;   HOT HEMOSTASIS N/A 08/14/2019   Procedure: HOT HEMOSTASIS (ARGON PLASMA COAGULATION/BICAP);  Surgeon: [name], MD;  [facility] ENDOSCOPY;  Service: Gastroenterology;  Laterality: N/A;   HOT HEMOSTASIS N/A 09/17/2019   Procedure: HOT HEMOSTASIS (ARGON PLASMA COAGULATION/BICAP);  Surgeon: [name], MD;  Location: [place];  Service: Endoscopy;  Laterality: N/A;   ORIF FEMUR FRACTURE Right 02/04/2021   Procedure: OPEN REDUCTION INTERNAL FIXATION (ORIF) DISTAL FEMUR FRACTURE;  Surgeon: Shona Needles, MD;  Location: [place];  Service: Orthopedics;  Laterality: Right;   PARTIAL COLECTOMY  2003   To address colon cancer   PATELLAR TENDON REPAIR Right 09/30/2018   Procedure: PATELLA TENDON REPAIR;  Surgeon: Altamese [place], MD;  Location: [place];  Service: Orthopedics;  Laterality: Right;   POLYPECTOMY  09/17/2019   Procedure: POLYPECTOMY;  Surgeon: [name], MD;  Location: [facility] ENDOSCOPY;  Service: Endoscopy;;       Family History  Problem Relation Age of Onset   Colon cancer Father    CAD Other    Colon cancer Paternal Uncle    Colon cancer Paternal Uncle    Diabetes Neg Hx     Social History   Tobacco Use   Smoking status: Former    Types: Cigarettes    Quit date: 1990    Years since quitting: 33.0   Smokeless tobacco: Never  Vaping Use   Vaping Use: Never used  Substance Use Topics   Alcohol use: Not Currently    Comment: 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 Authorizing Provider  acetaminophen (TYLENOL) 500 MG tablet Take 1,000-1,500 mg by mouth every 6 (six) hours as needed for moderate pain.    [provider]  carvedilol (COREG) 25 MG tablet Take 25 mg by mouth 2 (two) times daily with a meal.    [provider]  enoxaparin (LOVENOX) 30 MG/0.3ML injection Inject 30 mg into the skin daily. This is taken in addition to the 40mg  dose, for a total of 70mg  per day    [provider]  enoxaparin (LOVENOX) 40 MG/0.4ML injection Inject 40 mg into the skin daily. This is taken in addition to the 30mg  dose, for a total of 70mg  per day    [provider]  finasteride (PROSCAR) 5 MG tablet Take 1 tablet (5 mg  total) by mouth daily. 06/05/20   Dagar, Meredith Staggers, MD  furosemide (LASIX) 20 MG tablet Take 20 mg by mouth 2 (two) times daily. 12/26/20   [provider]  gabapentin (NEURONTIN) 300 MG capsule Take 1 capsule (300 mg total) by mouth 3 (three) times daily. 03/19/21   Bonnielee Haff, MD  lactulose (CHRONULAC) 10 GM/15ML solution Take 30 mLs (20 g total) by mouth daily. 03/20/21   Bonnielee Haff, MD  metFORMIN (GLUCOPHAGE-XR) 500 MG 24 hr tablet Take 500 mg by mouth daily with breakfast.  02/18/18   [provider]  ondansetron (ZOFRAN) 4 MG tablet Take 1 tablet (4 mg total) by mouth every 6 (six) hours as needed for nausea. 02/06/21   Corinne Ports, PA-C  pantoprazole (PROTONIX) 40 MG tablet Take 1 tablet (40 mg total) by mouth daily. 11/08/19 03/20/21  Esterwood, Amy S, PA-C  pravastatin (PRAVACHOL) 10 MG tablet Take 10 mg by mouth at bedtime. 02/18/18   [provider]  sertraline (ZOLOFT) 25 MG tablet Take 1 tablet (25 mg total) by mouth daily. 03/24/21   Dessa Phi, DO  spironolactone (ALDACTONE) 50 MG tablet Take 50 mg by mouth daily.    [provider]   tamsulosin (FLOMAX) 0.4 MG CAPS capsule Take 2 capsules (0.8 mg total) by mouth at bedtime. 03/23/21   Dessa Phi, DO    Allergies    Patient has no known allergies.  Review of Systems   Review of Systems  Genitourinary:  Positive for hematuria.  All other systems reviewed and are negative.  Physical Exam Updated Vital Signs BP 107/89 (BP Location: Left Arm)    Pulse 94    Temp 98.8 F (37.1 C) (Oral)    Resp 19    Ht 6' (1.829 m)    Wt 122.5 kg    SpO2 95%    BMI 36.62 kg/m   Physical Exam Vitals and nursing note reviewed.  Constitutional:      General: He is not in acute distress.    Appearance: He is well-developed. He is ill-appearing.     Comments: Chronically ill-appearing  HENT:     Head: Normocephalic and atraumatic.  Eyes:     Conjunctiva/sclera: Conjunctivae normal.     Pupils: Pupils are equal, round, and reactive to light.  Cardiovascular:     Rate and Rhythm: Normal rate and regular rhythm.     Heart sounds: No murmur heard. Pulmonary:     Effort: Pulmonary effort is normal. No respiratory distress.     Breath sounds: Normal breath sounds. No wheezing or rales.  Abdominal:     General: There is distension.     Palpations: Abdomen is soft.     Tenderness: There is no abdominal tenderness. There is no guarding or rebound.     Comments: Distended abdomen with some presence of ascites.  No localized pain  Genitourinary:    Comments: Foley catheter present without any evidence of swelling or bleeding from the urethral meatus.  Blood and clot present in the Foley catheter bag Musculoskeletal:        General: No tenderness. Normal range of motion.     Cervical back: Normal range of motion and neck supple.     Right lower leg: Edema present.     Left lower leg: Edema present.  Skin:    General: Skin is warm and dry.     Findings: No erythema or rash.  Neurological:     Mental Status: He is alert  and oriented to person, place, and time. Mental status is at  baseline.  Psychiatric:        Behavior: Behavior normal.    ED Results / Procedures / Treatments   Labs (all labs ordered are listed, but only abnormal results are displayed) Labs Reviewed  CBC WITH DIFFERENTIAL/PLATELET  BASIC METABOLIC PANEL  URINALYSIS, ROUTINE W REFLEX MICROSCOPIC    EKG None  Radiology No results found.  Procedures Procedures   Medications Ordered in ED Medications - No data to display  ED Course  I have reviewed the triage vital signs and the nursing notes.  Pertinent labs & imaging results that were available during my care of the patient were reviewed by me and considered in my medical decision making (see chart for details).    MDM Rules/Calculators/A&P                         Patient presented today most likely from traumatic hematuria.  His catheter bag was dropped yesterday at his facility and jerked hard.  Since that time he has noticed blood in his urine and he is anticoagulated.  He is not complaining of symptoms suggestive of retention.  Urine is draining freely in the bag but he reports it has been months since his catheter had been changed.  He has to have a catheter because of being chronically bedbound and he was leaking so much urine it was causing bedsores.  He otherwise is well-appearing.  We will change Foley catheter and flush.  To ensure the bulb is not blown up and in an appropriate location.  Will ensure patient's hemoglobin has not significantly changed as he does have a history of anemia and has required blood transfusion in the past.  3:11 PM Patient's Foley was in the inappropriate location and once it was removed and a new Foley catheter was placed patient drained 2 L of urine with only minimal blood clots and no further frank bleeding.  Patient does have a new AKI with creatinine of 2.5 however this is most likely from having urinary retention for 24 hours.  He does have a white cell count of 20,000 which may be an acute phase  reaction.  He is not febrile or having infectious symptoms at this time.  Now that patient is freely draining urine feel that he is stable to go back to [place] but will need a repeat CBC and CMP by Friday just to ensure numbers are going back to normal.  Hb stable today.  MDM   Amount and/or Complexity of Data Reviewed Clinical lab tests: ordered and reviewed Independent visualization of images, tracings, or specimens: yes        Final Clinical Impression(s) / ED Diagnoses Final diagnoses:  Gross hematuria  Urinary retention  AKI (acute kidney injury) [facility])    Rx / DC Orders ED Discharge Orders     None        Blanchie Dessert, MD 07/23/21 1514

## 2021-07-23 NOTE — ED Notes (Signed)
Pt given a sandwich and drink.

## 2021-07-23 NOTE — ED Notes (Signed)
Removed pt's indwelling foley catheter, deflated balloon and removed foley, aprox 5 inches of foley remained in pt, upon d/c of foley cath pt has some bleeding from penis.  Number 16 fr foley cath placed on first attempt, inserted to bifurcation, no urine output, md notified and at bedside, instructed to inflate balloon, pt denies any increase in pain when his foley balloon was inflated, flushed foley with 50 ml of ns, urine and blood clots returned, pt's foley then began draining.  Foley drained aprox 2000 ml of cloudy urine, towards the end pt drained purulent drainage

## 2021-07-23 NOTE — ED Notes (Signed)
Report called to LPN faith at [facility] at [phone], reviewed d/c and follow up, Faith verbalized understanding, pt awaits transport

## 2021-07-23 NOTE — Discharge Instructions (Signed)
You need to have your CBC and BMP repeated by Friday to make sure your kidney function is going back to normal.  If in the meantime you start running fever or having new abdominal pain or other concerns return to the emergency room.  You received a new catheter today and it is draining urine well.

## 2021-09-20 ENCOUNTER — Other Ambulatory Visit: Payer: Self-pay

## 2021-09-20 ENCOUNTER — Emergency Department (HOSPITAL_COMMUNITY): Payer: Medicare Other

## 2021-09-20 ENCOUNTER — Inpatient Hospital Stay (HOSPITAL_COMMUNITY)
Admission: EM | Admit: 2021-09-20 | Discharge: 2021-10-26 | DRG: 177 | Disposition: E | Payer: Medicare Other | Attending: Internal Medicine | Admitting: Internal Medicine

## 2021-09-20 ENCOUNTER — Observation Stay (HOSPITAL_COMMUNITY): Payer: Medicare Other

## 2021-09-20 ENCOUNTER — Observation Stay (HOSPITAL_BASED_OUTPATIENT_CLINIC_OR_DEPARTMENT_OTHER): Payer: Medicare Other

## 2021-09-20 DIAGNOSIS — Z951 Presence of aortocoronary bypass graft: Secondary | ICD-10-CM

## 2021-09-20 DIAGNOSIS — Z85038 Personal history of other malignant neoplasm of large intestine: Secondary | ICD-10-CM

## 2021-09-20 DIAGNOSIS — R4182 Altered mental status, unspecified: Secondary | ICD-10-CM

## 2021-09-20 DIAGNOSIS — R209 Unspecified disturbances of skin sensation: Secondary | ICD-10-CM | POA: Diagnosis not present

## 2021-09-20 DIAGNOSIS — K760 Fatty (change of) liver, not elsewhere classified: Secondary | ICD-10-CM | POA: Diagnosis present

## 2021-09-20 DIAGNOSIS — Z4659 Encounter for fitting and adjustment of other gastrointestinal appliance and device: Secondary | ICD-10-CM

## 2021-09-20 DIAGNOSIS — Z8249 Family history of ischemic heart disease and other diseases of the circulatory system: Secondary | ICD-10-CM

## 2021-09-20 DIAGNOSIS — N3000 Acute cystitis without hematuria: Principal | ICD-10-CM | POA: Diagnosis present

## 2021-09-20 DIAGNOSIS — K7682 Hepatic encephalopathy: Secondary | ICD-10-CM | POA: Diagnosis present

## 2021-09-20 DIAGNOSIS — I63541 Cerebral infarction due to unspecified occlusion or stenosis of right cerebellar artery: Secondary | ICD-10-CM | POA: Diagnosis not present

## 2021-09-20 DIAGNOSIS — I110 Hypertensive heart disease with heart failure: Secondary | ICD-10-CM | POA: Diagnosis present

## 2021-09-20 DIAGNOSIS — E870 Hyperosmolality and hypernatremia: Secondary | ICD-10-CM | POA: Diagnosis not present

## 2021-09-20 DIAGNOSIS — R0902 Hypoxemia: Secondary | ICD-10-CM

## 2021-09-20 DIAGNOSIS — I639 Cerebral infarction, unspecified: Secondary | ICD-10-CM

## 2021-09-20 DIAGNOSIS — U071 COVID-19: Secondary | ICD-10-CM | POA: Diagnosis not present

## 2021-09-20 DIAGNOSIS — J1282 Pneumonia due to coronavirus disease 2019: Secondary | ICD-10-CM | POA: Diagnosis present

## 2021-09-20 DIAGNOSIS — Z66 Do not resuscitate: Secondary | ICD-10-CM | POA: Diagnosis present

## 2021-09-20 DIAGNOSIS — D696 Thrombocytopenia, unspecified: Secondary | ICD-10-CM | POA: Diagnosis present

## 2021-09-20 DIAGNOSIS — Z515 Encounter for palliative care: Secondary | ICD-10-CM

## 2021-09-20 DIAGNOSIS — Z800 Family history of malignant neoplasm of digestive organs: Secondary | ICD-10-CM

## 2021-09-20 DIAGNOSIS — R29725 NIHSS score 25: Secondary | ICD-10-CM | POA: Diagnosis not present

## 2021-09-20 DIAGNOSIS — J9601 Acute respiratory failure with hypoxia: Secondary | ICD-10-CM | POA: Diagnosis present

## 2021-09-20 DIAGNOSIS — Z7401 Bed confinement status: Secondary | ICD-10-CM

## 2021-09-20 DIAGNOSIS — K7460 Unspecified cirrhosis of liver: Secondary | ICD-10-CM | POA: Diagnosis present

## 2021-09-20 DIAGNOSIS — E119 Type 2 diabetes mellitus without complications: Secondary | ICD-10-CM | POA: Diagnosis present

## 2021-09-20 DIAGNOSIS — R7881 Bacteremia: Secondary | ICD-10-CM | POA: Diagnosis present

## 2021-09-20 DIAGNOSIS — I482 Chronic atrial fibrillation, unspecified: Secondary | ICD-10-CM | POA: Diagnosis present

## 2021-09-20 DIAGNOSIS — G9340 Encephalopathy, unspecified: Secondary | ICD-10-CM | POA: Diagnosis present

## 2021-09-20 DIAGNOSIS — Z7984 Long term (current) use of oral hypoglycemic drugs: Secondary | ICD-10-CM

## 2021-09-20 DIAGNOSIS — K766 Portal hypertension: Secondary | ICD-10-CM | POA: Diagnosis present

## 2021-09-20 DIAGNOSIS — Z79899 Other long term (current) drug therapy: Secondary | ICD-10-CM

## 2021-09-20 DIAGNOSIS — Z881 Allergy status to other antibiotic agents status: Secondary | ICD-10-CM

## 2021-09-20 DIAGNOSIS — B9562 Methicillin resistant Staphylococcus aureus infection as the cause of diseases classified elsewhere: Secondary | ICD-10-CM | POA: Diagnosis present

## 2021-09-20 DIAGNOSIS — I8510 Secondary esophageal varices without bleeding: Secondary | ICD-10-CM | POA: Diagnosis present

## 2021-09-20 DIAGNOSIS — L89323 Pressure ulcer of left buttock, stage 3: Secondary | ICD-10-CM | POA: Diagnosis present

## 2021-09-20 DIAGNOSIS — I2510 Atherosclerotic heart disease of native coronary artery without angina pectoris: Secondary | ICD-10-CM | POA: Diagnosis present

## 2021-09-20 DIAGNOSIS — G9341 Metabolic encephalopathy: Secondary | ICD-10-CM | POA: Diagnosis present

## 2021-09-20 DIAGNOSIS — Z9049 Acquired absence of other specified parts of digestive tract: Secondary | ICD-10-CM

## 2021-09-20 DIAGNOSIS — Z87891 Personal history of nicotine dependence: Secondary | ICD-10-CM

## 2021-09-20 DIAGNOSIS — R29810 Facial weakness: Secondary | ICD-10-CM | POA: Diagnosis not present

## 2021-09-20 DIAGNOSIS — L8990 Pressure ulcer of unspecified site, unspecified stage: Secondary | ICD-10-CM | POA: Diagnosis present

## 2021-09-20 DIAGNOSIS — E785 Hyperlipidemia, unspecified: Secondary | ICD-10-CM | POA: Diagnosis present

## 2021-09-20 DIAGNOSIS — I5032 Chronic diastolic (congestive) heart failure: Secondary | ICD-10-CM | POA: Diagnosis present

## 2021-09-20 DIAGNOSIS — Z6836 Body mass index (BMI) 36.0-36.9, adult: Secondary | ICD-10-CM

## 2021-09-20 LAB — AMMONIA: Ammonia: 32 umol/L (ref 9–35)

## 2021-09-20 LAB — CBC WITH DIFFERENTIAL/PLATELET
Abs Immature Granulocytes: 0.06 10*3/uL (ref 0.00–0.07)
Basophils Absolute: 0 10*3/uL (ref 0.0–0.1)
Basophils Relative: 0 %
Eosinophils Absolute: 0 10*3/uL (ref 0.0–0.5)
Eosinophils Relative: 0 %
HCT: 48.1 % (ref 39.0–52.0)
Hemoglobin: 14.9 g/dL (ref 13.0–17.0)
Immature Granulocytes: 1 %
Lymphocytes Relative: 4 %
Lymphs Abs: 0.3 10*3/uL — ABNORMAL LOW (ref 0.7–4.0)
MCH: 27.4 pg (ref 26.0–34.0)
MCHC: 31 g/dL (ref 30.0–36.0)
MCV: 88.4 fL (ref 80.0–100.0)
Monocytes Absolute: 0.4 10*3/uL (ref 0.1–1.0)
Monocytes Relative: 4 %
Neutro Abs: 8.2 10*3/uL — ABNORMAL HIGH (ref 1.7–7.7)
Neutrophils Relative %: 91 %
Platelets: UNDETERMINED 10*3/uL (ref 150–400)
RBC: 5.44 MIL/uL (ref 4.22–5.81)
RDW: 14.7 % (ref 11.5–15.5)
WBC: 9 10*3/uL (ref 4.0–10.5)
nRBC: 0 % (ref 0.0–0.2)

## 2021-09-20 LAB — URINALYSIS, ROUTINE W REFLEX MICROSCOPIC
Glucose, UA: NEGATIVE mg/dL
Ketones, ur: NEGATIVE mg/dL
Nitrite: NEGATIVE
Protein, ur: >300 mg/dL — AB
Specific Gravity, Urine: 1.02 (ref 1.005–1.030)
pH: 6.5 (ref 5.0–8.0)

## 2021-09-20 LAB — I-STAT VENOUS BLOOD GAS, ED
Acid-Base Excess: 3 mmol/L — ABNORMAL HIGH (ref 0.0–2.0)
Bicarbonate: 29.3 mmol/L — ABNORMAL HIGH (ref 20.0–28.0)
Calcium, Ion: 1.34 mmol/L (ref 1.15–1.40)
HCT: 41 % (ref 39.0–52.0)
Hemoglobin: 13.9 g/dL (ref 13.0–17.0)
O2 Saturation: 98 %
Potassium: 3.7 mmol/L (ref 3.5–5.1)
Sodium: 141 mmol/L (ref 135–145)
TCO2: 31 mmol/L (ref 22–32)
pCO2, Ven: 51.4 mmHg (ref 44–60)
pH, Ven: 7.364 (ref 7.25–7.43)
pO2, Ven: 114 mmHg — ABNORMAL HIGH (ref 32–45)

## 2021-09-20 LAB — URINALYSIS, MICROSCOPIC (REFLEX)

## 2021-09-20 LAB — RESP PANEL BY RT-PCR (FLU A&B, COVID) ARPGX2
Influenza A by PCR: NEGATIVE
Influenza B by PCR: NEGATIVE
SARS Coronavirus 2 by RT PCR: POSITIVE — AB

## 2021-09-20 LAB — COMPREHENSIVE METABOLIC PANEL
ALT: 28 U/L (ref 0–44)
AST: 34 U/L (ref 15–41)
Albumin: 2.7 g/dL — ABNORMAL LOW (ref 3.5–5.0)
Alkaline Phosphatase: 92 U/L (ref 38–126)
Anion gap: 13 (ref 5–15)
BUN: 49 mg/dL — ABNORMAL HIGH (ref 8–23)
CO2: 25 mmol/L (ref 22–32)
Calcium: 9.9 mg/dL (ref 8.9–10.3)
Chloride: 101 mmol/L (ref 98–111)
Creatinine, Ser: 1.47 mg/dL — ABNORMAL HIGH (ref 0.61–1.24)
GFR, Estimated: 50 mL/min — ABNORMAL LOW (ref >60)
Glucose, Bld: 235 mg/dL — ABNORMAL HIGH (ref 70–99)
Potassium: 4.2 mmol/L (ref 3.5–5.1)
Sodium: 139 mmol/L (ref 135–145)
Total Bilirubin: 0.6 mg/dL (ref 0.3–1.2)
Total Protein: 7 g/dL (ref 6.5–8.1)

## 2021-09-20 LAB — LACTIC ACID, PLASMA
Lactic Acid, Venous: 2.1 mmol/L (ref 0.5–1.9)
Lactic Acid, Venous: 2.2 mmol/L (ref 0.5–1.9)

## 2021-09-20 LAB — APTT: aPTT: 29 seconds (ref 24–36)

## 2021-09-20 LAB — PROTIME-INR
INR: 1.2 (ref 0.8–1.2)
Prothrombin Time: 15.4 seconds — ABNORMAL HIGH (ref 11.4–15.2)

## 2021-09-20 LAB — D-DIMER, QUANTITATIVE: D-Dimer, Quant: 2.12 ug/mL-FEU — ABNORMAL HIGH (ref 0.00–0.50)

## 2021-09-20 LAB — SEDIMENTATION RATE: Sed Rate: 53 mm/hr — ABNORMAL HIGH (ref 0–16)

## 2021-09-20 LAB — FERRITIN: Ferritin: 234 ng/mL (ref 24–336)

## 2021-09-20 LAB — LACTATE DEHYDROGENASE: LDH: 206 U/L — ABNORMAL HIGH (ref 98–192)

## 2021-09-20 LAB — PROCALCITONIN: Procalcitonin: 1.21 ng/mL

## 2021-09-20 LAB — C-REACTIVE PROTEIN: CRP: 26.5 mg/dL — ABNORMAL HIGH (ref <1.0)

## 2021-09-20 IMAGING — CT CT ABD-PELV W/O CM
2 of 7 series · 13 of 46 positions shown, 15 images · non-contrast
Comparison: Chest x-ray [DATE], CT [DATE], CT [DATE],
MRI [DATE]

CLINICAL DATA: Altered mental status



[Series 3: a/p w/o 5mm · axial · non-contrast · 0.98mm/px · z∈[-617,-137]mm · 10 of 109 slices shown, 12 images]
[im 7/109  soft-tissue]
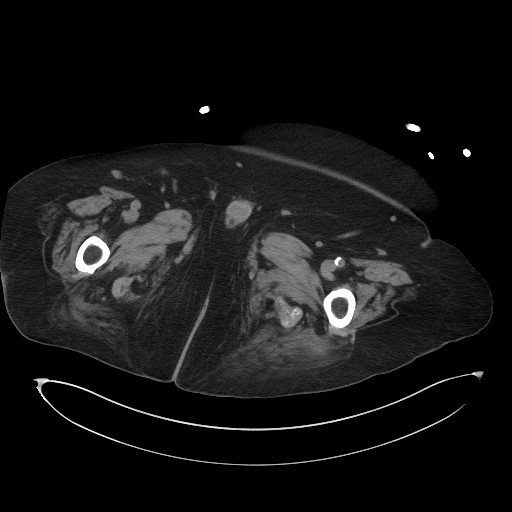
[im 7/109  bone]
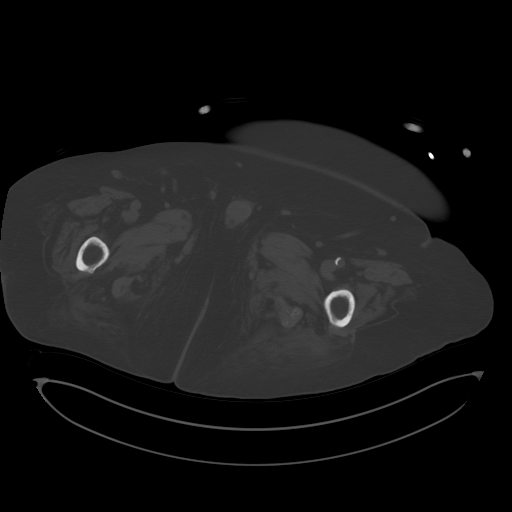
[im 19/109  soft-tissue]
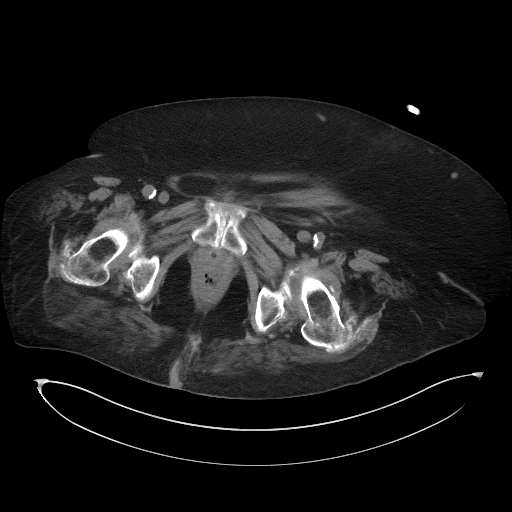
[im 31/109  soft-tissue]
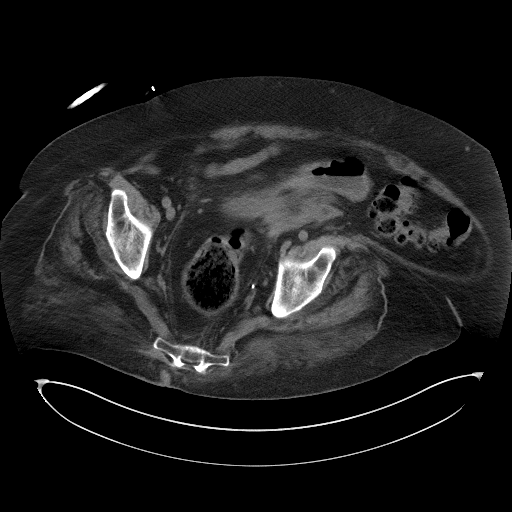
[im 37/109  soft-tissue]
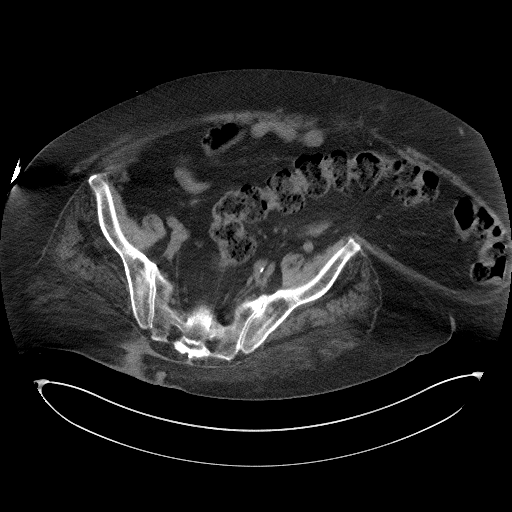
[im 49/109  soft-tissue]
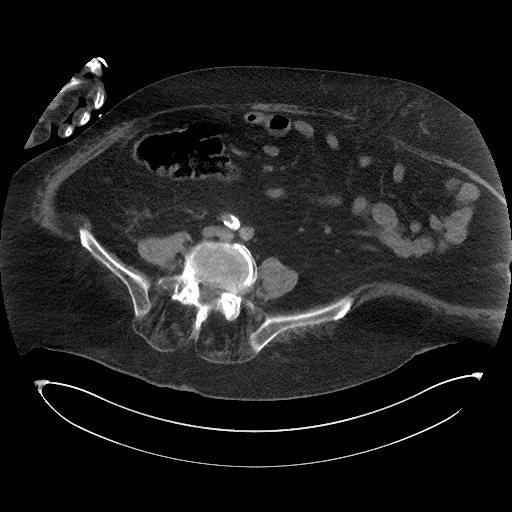
[im 61/109  soft-tissue]
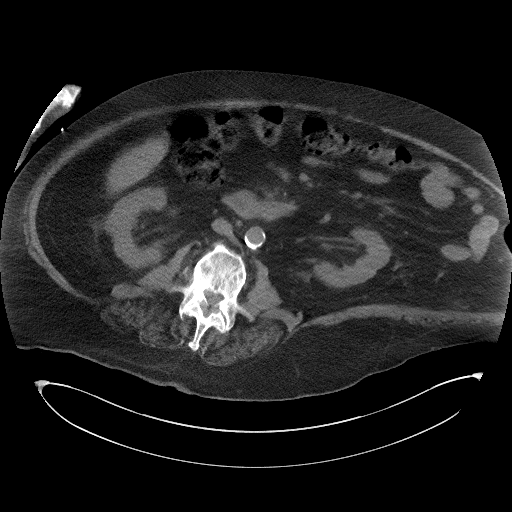
[im 73/109  soft-tissue]
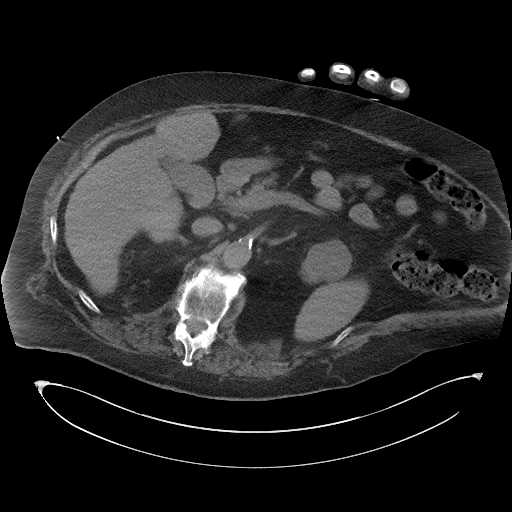
[im 79/109  soft-tissue]
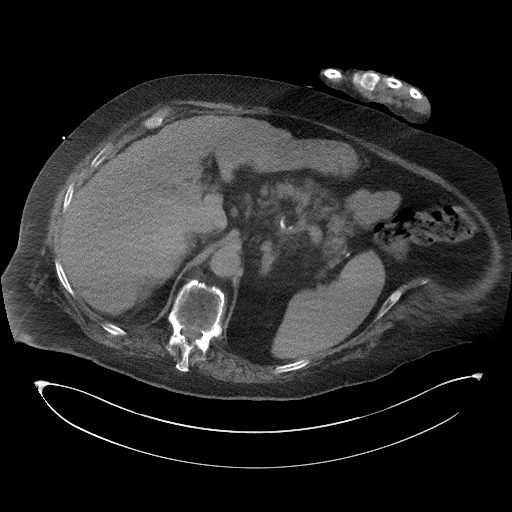
[im 91/109  soft-tissue]
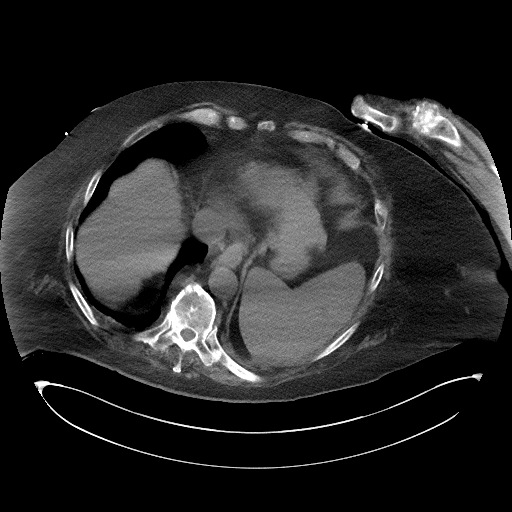
[im 91/109  bone]
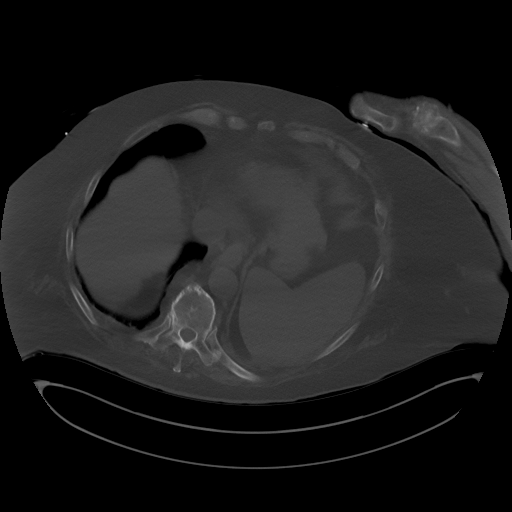
[im 103/109  soft-tissue]
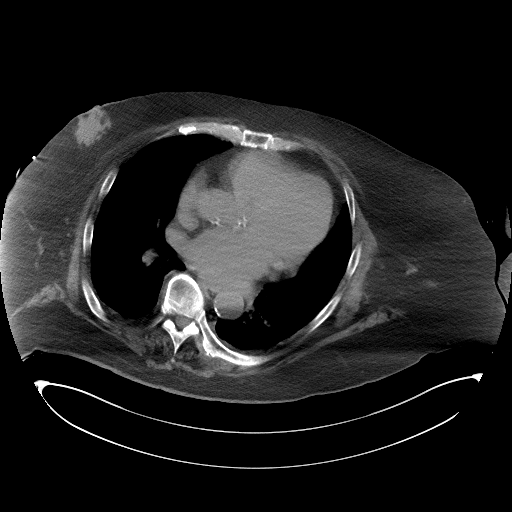

[Series 8: a/p w/o cor · coronal · non-contrast · 0.46mm/px · 3 of 179 slices shown]
[im 45/179  soft-tissue]
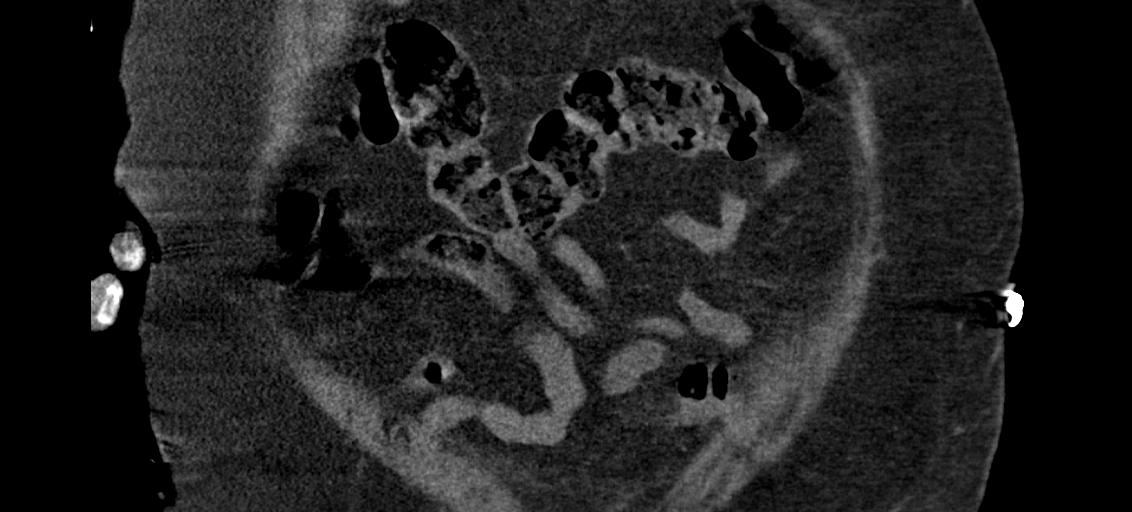
[im 90/179  soft-tissue]
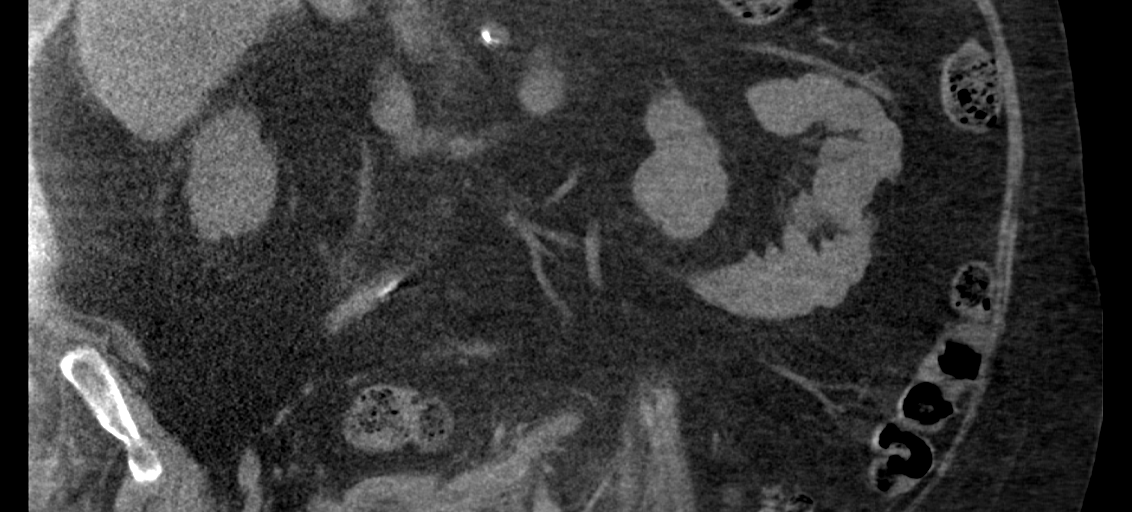
[im 134/179  soft-tissue]
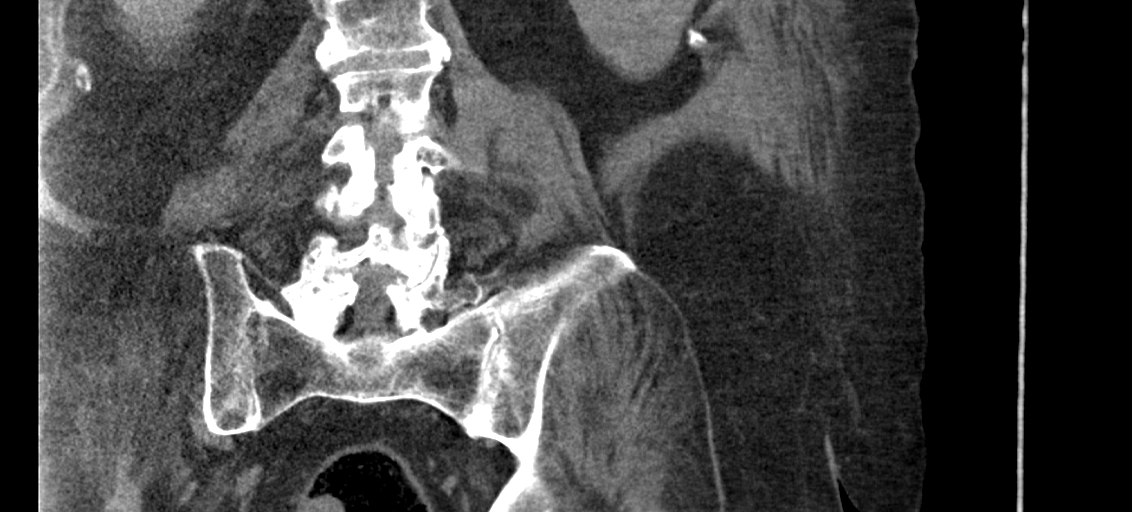

[13 of 46 positions shown; findings below may reference images not displayed]

FINDINGS: Lower chest: Lung bases demonstrate no acute consolidation.
Cardiomegaly. Gynecomastia. No significant effusion

Hepatobiliary: Liver cirrhosis. 6 x 3.8 cm mass within the left
hepatic lobe, series 3, image 39 is grossly unchanged since [XE]. No
calcified gallstone or biliary dilatation

Pancreas: Unremarkable. No pancreatic ductal dilatation or
surrounding inflammatory changes.

Spleen: Enlarged measuring 15 cm.

Adrenals/Urinary Tract: Nodular thickening left adrenal with no
mass. Right adrenal is normal. Kidneys show no hydronephrosis. Cysts
within the bilateral kidneys. Catheter in the bladder. Lobulated
appearing bladder with thick wall and scattered foci of gas

Stomach/Bowel: Stomach nonenlarged. No dilated small bowel. No acute
bowel wall thickening. Nonvisualized appendix. Postsurgical changes
at the ascending colon. Diverticular disease of the left colon
without acute wall thickening.

Vascular/Lymphatic: Advanced aortic atherosclerosis. No aneurysm.
Small distal esophageal varices.

Reproductive: Negative for mass.

Other: Negative for pelvic effusion or free air. Small fat
containing periumbilical and supraumbilical ventral hernias.

Musculoskeletal: No acute osseous abnormality.
IMPRESSION: 1. Slightly thick-walled appearance of the urinary bladder with
perivesical stranding, question cystitis. Small foci of gas are
present likely related to catheter placement. Lobulated somewhat
flaccid appearing bladder suggestive of neurogenic bladder.
2. Liver cirrhosis with grossly stable 6 cm mass, previously
evaluated with MRI. Lack of interval growth favors benign process.
Splenomegaly and small distal esophageal varices.
3. Fat containing periumbilical and ventral hernias.

## 2021-09-20 MED ORDER — VANCOMYCIN HCL 10 G IV SOLR
2500.0000 mg | Freq: Once | INTRAVENOUS | Status: AC
  Administered 2021-09-20: 2500 mg via INTRAVENOUS
  Filled 2021-09-20: qty 2500

## 2021-09-20 MED ORDER — HEPARIN BOLUS VIA INFUSION
6000.0000 [IU] | Freq: Once | INTRAVENOUS | Status: DC
  Filled 2021-09-20: qty 6000

## 2021-09-20 MED ORDER — ACETAMINOPHEN 650 MG RE SUPP
RECTAL | Status: AC
  Administered 2021-09-20: 650 mg via RECTAL
  Filled 2021-09-20: qty 1

## 2021-09-20 MED ORDER — ACETAMINOPHEN 650 MG RE SUPP
650.0000 mg | Freq: Once | RECTAL | Status: AC
  Administered 2021-09-20: 650 mg via RECTAL
  Filled 2021-09-20: qty 1

## 2021-09-20 MED ORDER — LACTATED RINGERS IV BOLUS (SEPSIS)
1000.0000 mL | Freq: Once | INTRAVENOUS | Status: AC
  Administered 2021-09-20: 1000 mL via INTRAVENOUS

## 2021-09-20 MED ORDER — HEPARIN BOLUS VIA INFUSION
3000.0000 [IU] | Freq: Once | INTRAVENOUS | Status: AC
  Administered 2021-09-20: 3000 [IU] via INTRAVENOUS
  Filled 2021-09-20: qty 3000

## 2021-09-20 MED ORDER — ACETAMINOPHEN 650 MG RE SUPP: 650.0000 mg | RECTAL | Status: DC | PRN

## 2021-09-20 MED ORDER — VANCOMYCIN HCL 1250 MG/250ML IV SOLN
1250.0000 mg | INTRAVENOUS | Status: DC
  Administered 2021-09-21 – 2021-09-25 (×5): 1250 mg via INTRAVENOUS
  Filled 2021-09-20 (×5): qty 250

## 2021-09-20 MED ORDER — METRONIDAZOLE 500 MG/100ML IV SOLN
500.0000 mg | Freq: Two times a day (BID) | INTRAVENOUS | Status: DC
  Administered 2021-09-20: 500 mg via INTRAVENOUS
  Filled 2021-09-20: qty 100

## 2021-09-20 MED ORDER — SODIUM CHLORIDE 0.9 % IV SOLN
2.0000 g | Freq: Once | INTRAVENOUS | Status: AC
  Administered 2021-09-20: 2 g via INTRAVENOUS
  Filled 2021-09-20: qty 2

## 2021-09-20 MED ORDER — SODIUM CHLORIDE 0.9 % IV SOLN
2.0000 g | Freq: Two times a day (BID) | INTRAVENOUS | Status: DC
  Administered 2021-09-20: 2 g via INTRAVENOUS
  Filled 2021-09-20: qty 2

## 2021-09-20 MED ORDER — VANCOMYCIN HCL IN DEXTROSE 1-5 GM/200ML-% IV SOLN
1000.0000 mg | Freq: Once | INTRAVENOUS | Status: DC
  Filled 2021-09-20: qty 200

## 2021-09-20 MED ORDER — LACTATED RINGERS IV BOLUS
1000.0000 mL | Freq: Once | INTRAVENOUS | Status: AC
  Administered 2021-09-20: 1000 mL via INTRAVENOUS

## 2021-09-20 MED ORDER — HEPARIN (PORCINE) 25000 UT/250ML-% IV SOLN
1300.0000 [IU]/h | INTRAVENOUS | Status: DC
Start: 2021-09-20 — End: 2021-09-21
  Administered 2021-09-20: 1500 [IU]/h via INTRAVENOUS
  Filled 2021-09-20: qty 250

## 2021-09-20 MED ORDER — LACTATED RINGERS IV SOLN: INTRAVENOUS | Status: DC

## 2021-09-20 MED ORDER — DEXAMETHASONE SODIUM PHOSPHATE 10 MG/ML IJ SOLN
6.0000 mg | INTRAMUSCULAR | Status: DC
  Administered 2021-09-20 – 2021-09-24 (×5): 6 mg via INTRAVENOUS
  Filled 2021-09-20 (×5): qty 1

## 2021-09-20 MED ORDER — SODIUM CHLORIDE 0.9 % IV SOLN: 2.0000 g | Freq: Two times a day (BID) | INTRAVENOUS | Status: DC

## 2021-09-20 MED ORDER — VANCOMYCIN HCL 1250 MG/250ML IV SOLN: 1250.0000 mg | INTRAVENOUS | Status: DC

## 2021-09-20 MED ORDER — METRONIDAZOLE 500 MG/100ML IV SOLN
500.0000 mg | Freq: Once | INTRAVENOUS | Status: AC
  Administered 2021-09-20: 500 mg via INTRAVENOUS
  Filled 2021-09-20: qty 100

## 2021-09-20 NOTE — ED Notes (Signed)
Patient transported to CT 

## 2021-09-20 NOTE — Progress Notes (Addendum)
Date: 09/21/2021               Patient Name:  Brandon [name] MRN: [id number]  DOB: 11/14/1945 Age / Sex: 76 y.o., male   PCP: Helane Rima, MD         Medical Service: Internal Medicine Teaching Service         Attending Physician: Dr. Jimmye Norman     First Contact: Lajean Manes, MD Pager: [phone]  Second Contact: Rick Duff, MD Pager: [phone]       After Hours (After 5p/  First Contact Pager: [phone]  weekends / holidays): Second Contact Pager: [phone]    Chief Complaint: AMS   History of Present Illness:  Brandon [name] is a 76 y.o. male with a pertinent PMH coronary artery disease, colon cancer, T2DM, cirrhosis, A-fib, portal hypertension, morbid obesity with chronic indwelling catheter, who is brought into the ED from his nursing facility d/t  AMS. Per chart review, patient is normally ambulatory and conversational but today is completely altered. Staff unable to determine when the last time he was normal. Patient also reportedly has been diagnosed with COVID since Wednesday. Patient is unable to provide any history for me today.  He moans when spoken to but is not able to answer any questions.   Tried to call his SNF [facility] x 2 times with no luck. Called son Brandon [name] for more information. He tells me that he was sent to the ED for concerns for increased O2 demands.   Initial ED workup with BP 148/99, pulse 107, temp 101.7 F. He was placed on a NRB and satting at 99%. CXR with atelectasis and cardiology. Lactic acid mildly elevated 2.1 -> 2.2. WBC 9.0.  Cr 1.47 (baseline 1.2), and GFR 50. Blood cultures pending. Ammonia 32. UA with large Hgb, proteinuria, and moderate leukocytes; microscopy with 21-50 WBC and many bacteria. ED started Vancomycin, cefepime, and metronidazole given concern for sepsis. Received 1L of fluids in the ED. On our exam, he is satting in the high 70's on room air, which improves to 99% on 5L.   Meds:  No current facility-administered  medications on file prior to encounter.   Current Outpatient Medications on File Prior to Encounter  Medication Sig Dispense Refill   acetaminophen (TYLENOL) 500 MG tablet Take 1,000-1,500 mg by mouth every 6 (six) hours as needed for moderate pain.     carvedilol (COREG) 25 MG tablet Take 25 mg by mouth 2 (two) times daily with a meal.     enoxaparin (LOVENOX) 30 MG/0.3ML injection Inject 30 mg into the skin daily. This is taken in addition to the 58m dose, for a total of 722mper day     enoxaparin (LOVENOX) 40 MG/0.4ML injection Inject 40 mg into the skin daily. This is taken in addition to the 3026mose, for a total of 21m78mr day     finasteride (PROSCAR) 5 MG tablet Take 1 tablet (5 mg total) by mouth daily. 30 tablet 0   furosemide (LASIX) 20 MG tablet Take 20 mg by mouth 2 (two) times daily.     gabapentin (NEURONTIN) 300 MG capsule Take 1 capsule (300 mg total) by mouth 3 (three) times daily.     lactulose (CHRONULAC) 10 GM/15ML solution Take 30 mLs (20 g total) by mouth daily. 236 mL 0   metFORMIN (GLUCOPHAGE-XR) 500 MG 24 hr tablet Take 500 mg by mouth daily with breakfast.      ondansetron (ZOFRAN) 4 MG tablet  Take 1 tablet (4 mg total) by mouth every 6 (six) hours as needed for nausea. 20 tablet 0   pantoprazole (PROTONIX) 40 MG tablet Take 1 tablet (40 mg total) by mouth daily. 30 tablet 11   pravastatin (PRAVACHOL) 10 MG tablet Take 10 mg by mouth at bedtime.     sertraline (ZOLOFT) 25 MG tablet Take 1 tablet (25 mg total) by mouth daily. 30 tablet 2   spironolactone (ALDACTONE) 50 MG tablet Take 50 mg by mouth daily.     tamsulosin (FLOMAX) 0.4 MG CAPS capsule Take 2 capsules (0.8 mg total) by mouth at bedtime. 60 capsule 0    Allergies: Allergies as of 09/14/2021   (No Known Allergies)   Past Medical History:  Diagnosis Date   Anemia 07/2019   Hb 6.7 >> 3 PRBCs >> 8.8 in 07/2019.      Atrial fibrillation ([place])    Cholelithiasis    Cirrhosis of liver ([place])    Presumed  due to EtOH as well as fatty liver from morbid obesity.  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 His smoking use included cigarettes. He has never used smokeless tobacco. He reports that he does not currently use alcohol. He reports that he does not use drugs.  Review of Systems: A complete ROS was negative except as per HPI.    Physical Exam: Blood pressure 124/75, pulse (!) 107, temperature 98.9 F (37.2 C), temperature source Axillary, resp. rate 20, height 6' (1.829 m), weight 122.5 kg, SpO2 99 %. Constitutional: ill appearing, somnolent  Eyes: erythematous eyes, EOMI Cardiovascular: irregular rhythm, tachycardic, no m/r/g, non-edematous bilateral LE Pulmonary/Chest: normal work of breathing on 5L supplemental O2, LCTAB Abdominal: soft, non-tender to palpation, non-distended GU: indwelling catheter with yellow appearing urine  Neurological: disoriented  Skin: warm and dry in all extremities except left upper extremity which is cool to touch with delayed capillary refill.  Psych: normal behavior, normal affect  EKG: atrial fibrillation   CXR: atelectasis   Assessment & Plan by Problem: Principal Problem:   Encephalopathy  Acute metabolic encephalopathy likely from UTI versus COVID infection  Likely due to UTI given pyuria in  setting of chronic indwelling catheter versus COVID infection since Wed with increasing O2 requirement at facility per son. Initially on NRB but now satting 99% on 5L. Labs unremarkable, no severe electrolyte abnormalities. Inflammatory markers ferritin, D dimer, ESR, CRP ordered. EDP started him on Vancomycin, cefepime, and metronidazole. Sepsis remains on ddx d/t elevated lactate and AMS in the setting of this infection. Previous admission 11/12/38 acute metabolic encephalopathy from UTI with urinary retention that improved with IV cefepime and then narrowed to Kelfex. Foley was placed at time of  admission. CT A/P with thickened irregular bladder wall with minimal adjacent infiltrative changes, pattern suggesting cystitis and probably a degree of wall trabeculation. Difficult to exclude mass in this setting, recommend correlation with cystoscopy. Urology consulted, recommended to keep catheter in place and to re-evaluate as an outpatient. Son reports that he has indwelling catheter 2/2 being bed bound.  - Continue Cefepime given concern for UTI - Discontinued Vanc, low concern for PNA  - Bolus another 1L IVF tonight  - Follow up urine culture and sensitivities  - Blood cultures pending  - Follow up on inflammatory markers, and hold off on steroids now for COVID d/t concern for possible worsening encephalopathy   - NPO until passes bedside swallow  - Resume flomax and finasteride after passing swallow screen  - If mental status does not improve despite antibiotics and further work-up, will obtain head imaging.   Cool left upper extremity  Left upper extremity which is cool to touch with delayed capillary refill. Concerned this may be an arterial embolism.  - Vascular upper extremity duplex ordered   - Heparin per pharmacy   Diabetes mellitus type 2 A1c 6.3 on 8/20 - Holding metformin  - Continue sliding scale insulin - CBG monitoring   Chronic A. fib Tachycardic 110 in setting of infection.    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

## 2021-09-20 NOTE — Progress Notes (Signed)
Pharmacy Antibiotic Note  Brandon [name] is a 76 y.o. male admitted on 09/03/2021 with AMS.  Pharmacy has been consulted for vancomycin and cefepime dosing for unknown infection. WBC wnl. Tmax 102.5. scr 1.47 with CrCl ~ 59 ml/min.  Plan: Vancomycin 2500mg  x1 loading dose followed by vancomycin 1250mg  q24h (eAUC 458 using Scr 1.47 and 0.5 for Vd) Cefepime 2g x1 followed by cefepime 2g q12h  Monitor renal function, cultures, and clinical progression Vancomycin levels as appropriate   Height: 6' (182.9 cm) Weight: 122.5 kg (270 lb) IBW/kg (Calculated) : 77.6  Temp (24hrs), Avg:100.8 F (38.2 C), Min:99 F (37.2 C), Max:102.5 F (39.2 C)  No results for input(s): WBC, CREATININE, LATICACIDVEN, VANCOTROUGH, VANCOPEAK, VANCORANDOM, GENTTROUGH, GENTPEAK, GENTRANDOM, TOBRATROUGH, TOBRAPEAK, TOBRARND, AMIKACINPEAK, AMIKACINTROU, AMIKACIN in the last 168 hours.  CrCl cannot be calculated (Patient's most recent lab result is older than the maximum 21 days allowed.).    No Known Allergies  Antimicrobials this admission: Vancomycin 2/24 >>  Cefepime 2/24 >>  Dose adjustments this admission:   Microbiology results: 2/24 bcx:  2/24 ucx:   Thank you for allowing pharmacy to be a part of this patients care.  Cristela Felt, PharmD, BCPS Clinical Pharmacist 09/15/2021 12:40 PM

## 2021-09-20 NOTE — ED Triage Notes (Signed)
Pt from [facility]-normally is ambulatory and conversational but today is now altered. They are not able to provide a last seen normal. Recent covid dx but they don't know when. Pt placed on NRB by facility but no one knows room air SpO2. 1.5 mg Narcan given by EMS for pinpoint pupils without change in mental status.

## 2021-09-20 NOTE — H&P (Addendum)
Date: 09/04/2021               Patient Name:  Brandon [name] MRN: [id number]  DOB: 10-10-1945 Age / Sex: 76 y.o., male   PCP: Helane Rima, MD         Medical Service: Internal Medicine Teaching Service         Attending Physician: Dr. Jimmye Norman     First Contact: Lajean Manes, MD Pager: [phone]  Second Contact: Rick Duff, MD Pager: [phone]       After Hours (After 5p/  First Contact Pager: [phone]  weekends / holidays): Second Contact Pager: [phone]    Chief Complaint: AMS   History of Present Illness:  Brandon [name] is a 76 y.o. male with a pertinent PMH coronary artery disease, colon cancer, T2DM, cirrhosis, A-fib, portal hypertension, morbid obesity with chronic indwelling catheter, who is brought into the ED from his nursing facility d/t  AMS. Per chart review, patient is normally ambulatory and conversational but today is completely altered. Staff unable to determine when the last time he was normal. Patient also reportedly has been diagnosed with COVID since Wednesday. Patient is unable to provide any history for me today.  He moans when spoken to but is not able to answer any questions.   Tried to call his SNF [facility] x 2 times with no luck. Called son Brandon [name] for more information. He tells me that he was sent to the ED for concerns for increased O2 demands.   Initial ED workup with BP 148/99, pulse 107, temp 101.7 F. He was placed on a NRB and satting at 99%. CXR with atelectasis and cardiology. Lactic acid mildly elevated 2.1 -> 2.2. WBC 9.0.  Cr 1.47 (baseline 1.2), and GFR 50. Blood cultures pending. Ammonia 32. UA with large Hgb, proteinuria, and moderate leukocytes; microscopy with 21-50 WBC and many bacteria. ED started Vancomycin, cefepime, and metronidazole given concern for sepsis. Received 1L of fluids in the ED. On our exam, he is satting in the high 70's on room air, which improves to 99% on 5L.   Meds:  No current facility-administered  medications on file prior to encounter.   Current Outpatient Medications on File Prior to Encounter  Medication Sig Dispense Refill   acetaminophen (TYLENOL) 500 MG tablet Take 1,000-1,500 mg by mouth every 6 (six) hours as needed for moderate pain.     Amino Acids-Protein Hydrolys (FEEDING SUPPLEMENT, PRO-STAT SUGAR FREE 64,) LIQD Take 30 mLs by mouth 3 (three) times daily with meals.     carvedilol (COREG) 25 MG tablet Take 25 mg by mouth 2 (two) times daily with a meal.     enoxaparin (LOVENOX) 30 MG/0.3ML injection Inject 30 mg into the skin daily. This is taken in addition to the $Remo'40mg'JfEmF$  dose, for a total of $Remove'70mg'zDOeZsE$  per day     enoxaparin (LOVENOX) 40 MG/0.4ML injection Inject 40 mg into the skin daily. This is taken in addition to the $Remo'30mg'LgLCH$  dose, for a total of $Remove'70mg'sByggFa$  per day     finasteride (PROSCAR) 5 MG tablet Take 1 tablet (5 mg total) by mouth daily. 30 tablet 0   furosemide (LASIX) 20 MG tablet Take 20 mg by mouth 2 (two) times daily.     gabapentin (NEURONTIN) 300 MG capsule Take 1 capsule (300 mg total) by mouth 3 (three) times daily.     hydrOXYzine (VISTARIL) 25 MG capsule Take 25 mg by mouth 3 (three) times daily as needed for itching.  lactulose (CHRONULAC) 10 GM/15ML solution Take 30 mLs (20 g total) by mouth daily. 236 mL 0   metFORMIN (GLUCOPHAGE-XR) 500 MG 24 hr tablet Take 500 mg by mouth daily with breakfast.      ondansetron (ZOFRAN) 4 MG tablet Take 1 tablet (4 mg total) by mouth every 6 (six) hours as needed for nausea. 20 tablet 0   pantoprazole (PROTONIX) 40 MG tablet Take 1 tablet (40 mg total) by mouth daily. 30 tablet 11   rosuvastatin (CRESTOR) 5 MG tablet Take 5 mg by mouth daily.     sertraline (ZOLOFT) 25 MG tablet Take 1 tablet (25 mg total) by mouth daily. 30 tablet 2   spironolactone (ALDACTONE) 50 MG tablet Take 50 mg by mouth daily.     tamsulosin (FLOMAX) 0.4 MG CAPS capsule Take 2 capsules (0.8 mg total) by mouth at bedtime. 60 capsule 0   triamcinolone cream  (KENALOG) 0.1 % Apply 1 application topically 3 (three) times daily.     vitamin C (ASCORBIC ACID) 500 MG tablet Take 500 mg by mouth 2 (two) times daily.     zinc sulfate 220 (50 Zn) MG capsule Take 220 mg by mouth daily.     pravastatin (PRAVACHOL) 10 MG tablet Take 10 mg by mouth at bedtime. (Patient not taking: Reported on 09/24/2021)      Allergies: Allergies as of 09/04/2021   (No Known Allergies)   Past Medical History:  Diagnosis Date   Anemia 07/2019   Hb 6.7 >> 3 PRBCs >> 8.8 in 07/2019.      Atrial fibrillation ([place])    Cholelithiasis    Cirrhosis of liver ([place])    Presumed due to EtOH as well as fatty liver from morbid obesity.  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 His smoking use included cigarettes. He has never used smokeless tobacco. He reports that he does not currently use alcohol. He reports that he does not use drugs.  Review of Systems: A complete ROS was negative except as per HPI.    Physical Exam: Blood pressure (!) 129/92, pulse (!) 105, temperature (!) 102.1 F (38.9 C), temperature source Rectal, resp. rate 18, height 6' (1.829 m), weight 122.5 kg, SpO2 94 %. Constitutional: ill appearing, somnolent  Eyes: erythematous eyes, EOMI Cardiovascular: irregular rhythm, tachycardic, no m/r/g, non-edematous  bilateral LE Pulmonary/Chest: normal work of breathing on 5L supplemental O2, LCTAB Abdominal: soft, non-tender to palpation, non-distended GU: indwelling catheter with yellow appearing urine  Neurological: disoriented  Skin: warm and dry in all extremities except left upper extremity which is cool to touch with delayed capillary refill.  Psych: normal behavior, normal affect  EKG: atrial fibrillation   CXR: atelectasis  Assessment & Plan by Problem: Principal Problem:   Encephalopathy  Acute metabolic encephalopathy likely from UTI versus COVID infection Fever of 102F  Likely due to UTI given pyuria in setting of chronic indwelling catheter versus COVID infection since Wed with increasing O2 requirement at facility per son. Fever up to 102F. Initially on NRB but now satting 99% on 5L. Labs unremarkable, no severe electrolyte abnormalities. Inflammatory markers ferritin, D dimer, ESR, CRP ordered. EDP started him on Vancomycin, cefepime, and metronidazole. Sepsis remains on ddx d/t elevated lactate and AMS in the setting of this infection. Previous admission 04/12/03 acute metabolic encephalopathy from UTI with urinary retention that improved with IV cefepime and then narrowed to Kelfex. Foley was placed at time of admission. CT A/P with thickened irregular bladder wall with minimal adjacent infiltrative changes, pattern suggesting cystitis and probably a degree of wall trabeculation. Difficult to exclude mass in this setting, recommend correlation with cystoscopy. Urology consulted, recommended to keep catheter in place and to re-evaluate as an outpatient. Son reports that he has indwelling catheter 2/2 being bed bound.  - Continue broad spectrum abx with vancomycin, cefepime, and metronidazole given unknown etiology  - VBG - Bolus another 1L IVF tonight  - Follow up urine culture and sensitivities  - Blood cultures pending  - Follow up on inflammatory markers - Started decadron 6 mg  injection  - NPO until passes bedside swallow  - Resume flomax and finasteride after passing swallow screen  - If mental status does not improve despite antibiotics and further work-up, will obtain head imaging.   Cool left upper extremity  Left upper extremity which is cool to touch with delayed capillary refill. Concerned this may be an arterial embolism.  - Vascular upper extremity duplex ordered   - Heparin per pharmacy   Diabetes mellitus type 2 A1c 6.3 on 8/20 - Holding metformin  - Continue sliding scale insulin - CBG monitoring   Chronic A. fib Tachycardic 110 in setting of infection.   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

## 2021-09-20 NOTE — Progress Notes (Signed)
Left upper extremity arterial duplex completed. Refer to "CV Proc" under chart review to view preliminary results.  09/07/2021 6:51 PM Kelby Aline., MHA, RVT, RDCS, RDMS

## 2021-09-20 NOTE — ED Provider Notes (Signed)
[facility] EMERGENCY DEPARTMENT Provider Note   CSN: [id number] Arrival date & time: 08/31/2021  1150     History  Chief Complaint  Patient presents with   Altered Mental Status    Brandon [name] is a 76 y.o. male.   Altered Mental Status Associated symptoms: fever    Patient has a history of multiple medical problems including coronary artery disease, colon cancer, diabetes, cirrhosis, A-fib, portal hypertension, morbid obesity who resides at a nursing facility.  Patient is normally ambulatory and conversational but today is completely altered.  Staff unable to determine when the last time he was normal.  Patient also reportedly has been diagnosed with COVID recently but unknown when.  Patient is unable to provide any history for me today.  He moans when spoken to but is not able to answer any questions.  Home Medications Prior to Admission medications   Medication Sig Start Date End Date Taking? Authorizing Provider  acetaminophen (TYLENOL) 500 MG tablet Take 1,000-1,500 mg by mouth every 6 (six) hours as needed for moderate pain.    [provider]  carvedilol (COREG) 25 MG tablet Take 25 mg by mouth 2 (two) times daily with a meal.    [provider]  enoxaparin (LOVENOX) 30 MG/0.3ML injection Inject 30 mg into the skin daily. This is taken in addition to the 40mg  dose, for a total of 70mg  per day    [provider]  enoxaparin (LOVENOX) 40 MG/0.4ML injection Inject 40 mg into the skin daily. This is taken in addition to the 30mg  dose, for a total of 70mg  per day    [provider]  finasteride (PROSCAR) 5 MG tablet Take 1 tablet (5 mg total) by mouth daily. 06/05/20   Dagar, Meredith Staggers, MD  furosemide (LASIX) 20 MG tablet Take 20 mg by mouth 2 (two) times daily. 12/26/20   [provider]  gabapentin (NEURONTIN) 300 MG capsule Take 1 capsule (300 mg total) by mouth 3 (three) times daily. 03/19/21   Bonnielee Haff, MD   lactulose (CHRONULAC) 10 GM/15ML solution Take 30 mLs (20 g total) by mouth daily. 03/20/21   Bonnielee Haff, MD  metFORMIN (GLUCOPHAGE-XR) 500 MG 24 hr tablet Take 500 mg by mouth daily with breakfast.  02/18/18   [provider]  ondansetron (ZOFRAN) 4 MG tablet Take 1 tablet (4 mg total) by mouth every 6 (six) hours as needed for nausea. 02/06/21   Corinne Ports, PA-C  pantoprazole (PROTONIX) 40 MG tablet Take 1 tablet (40 mg total) by mouth daily. 11/08/19 03/20/21  Esterwood, Amy S, PA-C  pravastatin (PRAVACHOL) 10 MG tablet Take 10 mg by mouth at bedtime. 02/18/18   [provider]  sertraline (ZOLOFT) 25 MG tablet Take 1 tablet (25 mg total) by mouth daily. 03/24/21   Dessa Phi, DO  spironolactone (ALDACTONE) 50 MG tablet Take 50 mg by mouth daily.    [provider]  tamsulosin (FLOMAX) 0.4 MG CAPS capsule Take 2 capsules (0.8 mg total) by mouth at bedtime. 03/23/21   Dessa Phi, DO      Allergies    Patient has no known allergies.    Review of Systems   Review of Systems  Constitutional:  Positive for fever.   Physical Exam Updated Vital Signs BP (!) 148/99    Pulse (!) 107    Temp (!) 101.7 F (38.7 C) (Axillary)    Resp 18    Ht 1.829 m (6')    Wt  122.5 kg    SpO2 97%    BMI 36.62 kg/m  Physical Exam Vitals and nursing note reviewed.  Constitutional:      General: He is in acute distress.     Appearance: He is obese. He is ill-appearing.  HENT:     Head: Normocephalic and atraumatic.     Right Ear: External ear normal.     Left Ear: External ear normal.  Eyes:     General: No scleral icterus.       Right eye: No discharge.        Left eye: No discharge.     Conjunctiva/sclera: Conjunctivae normal.  Neck:     Trachea: No tracheal deviation.  Cardiovascular:     Rate and Rhythm: Normal rate and regular rhythm.  Pulmonary:     Effort: Pulmonary effort is normal. No respiratory distress.     Breath sounds: Normal breath sounds. No  stridor. No wheezing or rales.  Abdominal:     General: Bowel sounds are normal. There is no distension.     Palpations: Abdomen is soft.     Tenderness: There is no abdominal tenderness. There is no guarding or rebound.  Genitourinary:    Comments: Foley catheter in place mucopurulent drainage noted from the urethra, incontinent of brown stool Musculoskeletal:        General: No tenderness or deformity.     Cervical back: Neck supple.  Skin:    General: Skin is warm and dry.     Findings: Rash present.     Comments: Scaling dry skin noted diffusely, some areas and patches, questionable psoriasis  Neurological:     GCS: GCS eye subscore is 4. GCS verbal subscore is 2. GCS motor subscore is 5.     Motor: No abnormal muscle tone or seizure activity.     Comments: Patient does not follow commands, moans when spoken to and examined  Psychiatric:        Mood and Affect: Mood normal.    ED Results / Procedures / Treatments   Labs (all labs ordered are listed, but only abnormal results are displayed) Labs Reviewed  RESP PANEL BY RT-PCR (FLU A&B, COVID) ARPGX2 - Abnormal; Notable for the following components:      Result Value   SARS Coronavirus 2 by RT PCR POSITIVE (*)    All other components within normal limits  LACTIC ACID, PLASMA - Abnormal; Notable for the following components:   Lactic Acid, Venous 2.1 (*)    All other components within normal limits  COMPREHENSIVE METABOLIC PANEL - Abnormal; Notable for the following components:   Glucose, Bld 235 (*)    BUN 49 (*)    Creatinine, Ser 1.47 (*)    Albumin 2.7 (*)    GFR, Estimated 50 (*)    All other components within normal limits  CBC WITH DIFFERENTIAL/PLATELET - Abnormal; Notable for the following components:   Neutro Abs 8.2 (*)    Lymphs Abs 0.3 (*)    All other components within normal limits  PROTIME-INR - Abnormal; Notable for the following components:   Prothrombin Time 15.4 (*)    All other components within  normal limits  URINALYSIS, ROUTINE W REFLEX MICROSCOPIC - Abnormal; Notable for the following components:   APPearance CLOUDY (*)    Hgb urine dipstick LARGE (*)    Bilirubin Urine SMALL (*)    Protein, ur >300 (*)    Leukocytes,Ua MODERATE (*)    All other components within  normal limits  URINALYSIS, MICROSCOPIC (REFLEX) - Abnormal; Notable for the following components:   Bacteria, UA MANY (*)    All other components within normal limits  CULTURE, BLOOD (ROUTINE X 2)  CULTURE, BLOOD (ROUTINE X 2)  URINE CULTURE  APTT  AMMONIA  LACTIC ACID, PLASMA    EKG EKG Interpretation  Date/Time:  Friday September 20 2021 11:58:35 EST Ventricular Rate:  105 PR Interval:    QRS Duration: 105 QT Interval:  356 QTC Calculation: 471 R Axis:   -58 Text Interpretation: Atrial fibrillation LAD, consider left anterior fascicular block Abnormal R-wave progression, late transition Since last tracing rate faster Confirmed by Dorie Rank [phone]) on 09/14/2021 3:08:25 PM  Radiology DG Chest Port 1 View  Result Date: 09/13/2021 CLINICAL DATA:  Questionable sepsis EXAM: PORTABLE CHEST 1 VIEW COMPARISON:  March 20, 2021. FINDINGS: Similar cardiac enlargement. Tortuous thoracic aorta with aortic atherosclerosis. Low lung volumes with dependent atelectasis. No visible pleural effusion or pneumothorax. Prior median sternotomy. IMPRESSION: 1. Low lung volumes with dependent atelectasis. 2. Similar enlarged cardiac silhouette. Electronically Signed   By: Dahlia Bailiff M.D.   On: 09/15/2021 12:57    Procedures Procedures    Medications Ordered in ED Medications  vancomycin (VANCOREADY) IVPB 1250 mg/250 mL (has no administration in time range)  ceFEPIme (MAXIPIME) 2 g in sodium chloride 0.9 % 100 mL IVPB (has no administration in time range)  lactated ringers bolus 1,000 mL (1,000 mLs Intravenous New Bag/Given 08/29/2021 1523)  lactated ringers bolus 1,000 mL (0 mLs Intravenous Stopped 09/13/2021 1306)  ceFEPIme  (MAXIPIME) 2 g in sodium chloride 0.9 % 100 mL IVPB (0 g Intravenous Stopped 09/16/2021 1308)  metroNIDAZOLE (FLAGYL) IVPB 500 mg (0 mg Intravenous Stopped 09/22/2021 1345)  vancomycin (VANCOCIN) 2,500 mg in sodium chloride 0.9 % 500 mL IVPB (2,500 mg Intravenous New Bag/Given 09/15/2021 1301)  acetaminophen (TYLENOL) suppository 650 mg (650 mg Rectal Given 09/19/2021 1523)    ED Course/ Medical Decision Making/ A&P Clinical Course as of 09/07/2021 1543  Fri Sep 20, 2021  1322 DG Chest [place] 1 View Chest x-ray images and radiology report reviewed.  Atelectasis and cardiomegaly noted [JK]  1507 Urinalysis shows many bacteria 21-50 white blood cells.  Patient also has positive COVID test [JK]  1507 Lactic acid, plasma(!!) Lactic acid level was elevated at 2.1 [JK]  1507 Comprehensive metabolic panel(!) Creatinine elevated but decreased from 1 month ago [JK]  1525 CBC WITH DIFFERENTIAL(!) Normal [JK]  1542 Discussed with IM service regarding admission [JK]    Clinical Course User Index [JK] Dorie Rank, MD                           Medical Decision Making Amount and/or Complexity of Data Reviewed Labs: ordered. Decision-making details documented in ED Course. Radiology: ordered. Decision-making details documented in ED Course. ECG/medicine tests: ordered.  Risk OTC drugs. Prescription drug management.   Patient presented to ED for evaluation of altered mental status.  Noted to be febrile with a temperature 102.  Patient with tachycardia but no hypotension noted.  No signs of severe sepsis.  Urinalysis is significant for large hemoglobin and moderate leukocyte esterase.  Patient noted to have many bacteria and pyuria.  Consistent with urinary tract infection.  Patient also is positive for COVID.  Started on broad-spectrum antibiotics to cover for the possibility of evolving sepsis.  We will consult the medical service for admission and further treatment  Final Clinical Impression(s) /  ED Diagnoses Final diagnoses:  Acute cystitis without hematuria  COVID-19 virus infection  Altered mental status, unspecified altered mental status type     Dorie Rank, MD 09/18/2021 [phone]

## 2021-09-20 NOTE — Progress Notes (Addendum)
ANTICOAGULATION CONSULT NOTE - Initial Consult  Pharmacy Consult for Heparin Indication: atrial fibrillation and Possible L arterial embolus  No Known Allergies  Patient Measurements: Height: 6' (182.9 cm) Weight: 122.5 kg (270 lb) IBW/kg (Calculated) : 77.6 Heparin Dosing Weight: 104.6  Vital Signs: Temp: 101.7 F (38.7 C) (02/24 1508) Temp Source: Axillary (02/24 1508) BP: 146/83 (02/24 1615) Pulse Rate: 122 (02/24 1615)  Labs: Recent Labs    09/09/2021 1230  HGB 14.9  HCT 48.1  PLT PLATELET CLUMPS NOTED ON SMEAR, UNABLE TO ESTIMATE  APTT 29  LABPROT 15.4*  INR 1.2  CREATININE 1.47*    Estimated Creatinine Clearance: 59.6 mL/min (A) (by C-G formula based on SCr of 1.47 mg/dL (H)).   Medical History: Past Medical History:  Diagnosis Date   Anemia 07/2019   Hb 6.7 >> 3 PRBCs >> 8.8 in 07/2019.      Atrial fibrillation ([place])    Cholelithiasis    Cirrhosis of liver ([place])    Presumed due to EtOH as well as fatty liver from morbid obesity.  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 Patient is presenting with AMS from SNF. Heparin per pharmacy consult placed for atrial fibrillation and Possible L arterial embolus .  Patient is not on therapeutic anticoagulation prior to arrival (was receiving  70mg  enoxaparin q24h at facility - last dose 2/23)  Hgb 14.9; plt clumps noted on smear  Goal of Therapy:  Heparin level 0.3-0.7 units/ml Monitor platelets by anticoagulation protocol: Yes   Plan:  Give conservative 3000 units bolus x 1 - given platelet irregularity on CBC and historical downtrend Start heparin infusion at 1500 units/hr Check anti-Xa level at 0100 and daily while on heparin Continue to monitor H&H and platelets  Lorelei Pont, PharmD, BCPS 09/21/2021 4:33 PM ED Clinical Pharmacist -  [phone]

## 2021-09-20 NOTE — ED Notes (Signed)
Son Rakeen Gaillard [phone] wants an update

## 2021-09-21 ENCOUNTER — Encounter (HOSPITAL_COMMUNITY): Payer: Self-pay | Admitting: Internal Medicine

## 2021-09-21 DIAGNOSIS — D696 Thrombocytopenia, unspecified: Secondary | ICD-10-CM | POA: Diagnosis present

## 2021-09-21 DIAGNOSIS — R4182 Altered mental status, unspecified: Secondary | ICD-10-CM | POA: Diagnosis present

## 2021-09-21 DIAGNOSIS — G9341 Metabolic encephalopathy: Secondary | ICD-10-CM | POA: Diagnosis present

## 2021-09-21 DIAGNOSIS — I482 Chronic atrial fibrillation, unspecified: Secondary | ICD-10-CM

## 2021-09-21 DIAGNOSIS — I639 Cerebral infarction, unspecified: Secondary | ICD-10-CM | POA: Diagnosis not present

## 2021-09-21 DIAGNOSIS — I5032 Chronic diastolic (congestive) heart failure: Secondary | ICD-10-CM | POA: Diagnosis present

## 2021-09-21 DIAGNOSIS — G9340 Encephalopathy, unspecified: Secondary | ICD-10-CM | POA: Diagnosis not present

## 2021-09-21 DIAGNOSIS — K766 Portal hypertension: Secondary | ICD-10-CM | POA: Diagnosis present

## 2021-09-21 DIAGNOSIS — J1282 Pneumonia due to coronavirus disease 2019: Secondary | ICD-10-CM | POA: Diagnosis present

## 2021-09-21 DIAGNOSIS — N390 Urinary tract infection, site not specified: Secondary | ICD-10-CM

## 2021-09-21 DIAGNOSIS — K7460 Unspecified cirrhosis of liver: Secondary | ICD-10-CM | POA: Diagnosis present

## 2021-09-21 DIAGNOSIS — Z515 Encounter for palliative care: Secondary | ICD-10-CM | POA: Diagnosis not present

## 2021-09-21 DIAGNOSIS — J9601 Acute respiratory failure with hypoxia: Secondary | ICD-10-CM | POA: Diagnosis present

## 2021-09-21 DIAGNOSIS — E119 Type 2 diabetes mellitus without complications: Secondary | ICD-10-CM | POA: Diagnosis present

## 2021-09-21 DIAGNOSIS — Z6836 Body mass index (BMI) 36.0-36.9, adult: Secondary | ICD-10-CM | POA: Diagnosis not present

## 2021-09-21 DIAGNOSIS — K7682 Hepatic encephalopathy: Secondary | ICD-10-CM | POA: Diagnosis present

## 2021-09-21 DIAGNOSIS — I110 Hypertensive heart disease with heart failure: Secondary | ICD-10-CM | POA: Diagnosis present

## 2021-09-21 DIAGNOSIS — R7881 Bacteremia: Secondary | ICD-10-CM | POA: Diagnosis present

## 2021-09-21 DIAGNOSIS — N3000 Acute cystitis without hematuria: Secondary | ICD-10-CM | POA: Diagnosis present

## 2021-09-21 DIAGNOSIS — Z66 Do not resuscitate: Secondary | ICD-10-CM | POA: Diagnosis present

## 2021-09-21 DIAGNOSIS — I8510 Secondary esophageal varices without bleeding: Secondary | ICD-10-CM | POA: Diagnosis present

## 2021-09-21 DIAGNOSIS — B9562 Methicillin resistant Staphylococcus aureus infection as the cause of diseases classified elsewhere: Secondary | ICD-10-CM | POA: Diagnosis present

## 2021-09-21 DIAGNOSIS — E870 Hyperosmolality and hypernatremia: Secondary | ICD-10-CM | POA: Diagnosis not present

## 2021-09-21 DIAGNOSIS — U071 COVID-19: Principal | ICD-10-CM

## 2021-09-21 DIAGNOSIS — I2510 Atherosclerotic heart disease of native coronary artery without angina pectoris: Secondary | ICD-10-CM | POA: Diagnosis present

## 2021-09-21 DIAGNOSIS — I63541 Cerebral infarction due to unspecified occlusion or stenosis of right cerebellar artery: Secondary | ICD-10-CM | POA: Diagnosis not present

## 2021-09-21 DIAGNOSIS — E785 Hyperlipidemia, unspecified: Secondary | ICD-10-CM

## 2021-09-21 DIAGNOSIS — L89323 Pressure ulcer of left buttock, stage 3: Secondary | ICD-10-CM | POA: Diagnosis present

## 2021-09-21 LAB — BLOOD CULTURE ID PANEL (REFLEXED) - BCID2

## 2021-09-21 LAB — CBC
HCT: 38.8 % — ABNORMAL LOW (ref 39.0–52.0)
Hemoglobin: 11.9 g/dL — ABNORMAL LOW (ref 13.0–17.0)
MCH: 27.4 pg (ref 26.0–34.0)
MCHC: 30.7 g/dL (ref 30.0–36.0)
MCV: 89.4 fL (ref 80.0–100.0)
Platelets: 53 10*3/uL — ABNORMAL LOW (ref 150–400)
RBC: 4.34 MIL/uL (ref 4.22–5.81)
RDW: 14.7 % (ref 11.5–15.5)
WBC: 7 10*3/uL (ref 4.0–10.5)
nRBC: 0 % (ref 0.0–0.2)

## 2021-09-21 LAB — BASIC METABOLIC PANEL
Anion gap: 9 (ref 5–15)
BUN: 52 mg/dL — ABNORMAL HIGH (ref 8–23)
CO2: 26 mmol/L (ref 22–32)
Calcium: 9.3 mg/dL (ref 8.9–10.3)
Chloride: 108 mmol/L (ref 98–111)
Creatinine, Ser: 1.42 mg/dL — ABNORMAL HIGH (ref 0.61–1.24)
GFR, Estimated: 52 mL/min — ABNORMAL LOW (ref >60)
Glucose, Bld: 221 mg/dL — ABNORMAL HIGH (ref 70–99)
Potassium: 3.9 mmol/L (ref 3.5–5.1)
Sodium: 143 mmol/L (ref 135–145)

## 2021-09-21 LAB — GLUCOSE, CAPILLARY
Glucose-Capillary: 186 mg/dL — ABNORMAL HIGH (ref 70–99)
Glucose-Capillary: 163 mg/dL — ABNORMAL HIGH (ref 70–99)
Glucose-Capillary: 156 mg/dL — ABNORMAL HIGH (ref 70–99)

## 2021-09-21 LAB — URINE CULTURE

## 2021-09-21 LAB — HEPARIN LEVEL (UNFRACTIONATED)
Heparin Unfractionated: 0.86 IU/mL — ABNORMAL HIGH (ref 0.30–0.70)
Heparin Unfractionated: 0.65 IU/mL (ref 0.30–0.70)

## 2021-09-21 LAB — HEMOGLOBIN A1C
Hgb A1c MFr Bld: 6.8 % — ABNORMAL HIGH (ref 4.8–5.6)
Mean Plasma Glucose: 148.46 mg/dL

## 2021-09-21 MED ORDER — LACTULOSE ENEMA
300.0000 mL | Freq: Every day | ORAL | Status: DC
  Administered 2021-09-22: 300 mL via RECTAL
  Filled 2021-09-21 (×4): qty 300

## 2021-09-21 MED ORDER — ENOXAPARIN SODIUM 40 MG/0.4ML IJ SOSY: 40.0000 mg | PREFILLED_SYRINGE | INTRAMUSCULAR | Status: DC

## 2021-09-21 MED ORDER — INSULIN ASPART 100 UNIT/ML IJ SOLN
0.0000 [IU] | Freq: Three times a day (TID) | INTRAMUSCULAR | Status: DC
  Administered 2021-09-21 – 2021-09-23 (×6): 2 [IU] via SUBCUTANEOUS

## 2021-09-21 MED ORDER — SODIUM CHLORIDE 0.9 % IV SOLN: 1.0000 g | INTRAVENOUS | Status: DC

## 2021-09-21 MED ORDER — LACTATED RINGERS IV BOLUS (SEPSIS)
1000.0000 mL | Freq: Once | INTRAVENOUS | Status: AC
  Administered 2021-09-21: 1000 mL via INTRAVENOUS

## 2021-09-21 MED ORDER — ENOXAPARIN SODIUM 80 MG/0.8ML IJ SOSY
70.0000 mg | PREFILLED_SYRINGE | INTRAMUSCULAR | Status: DC
  Administered 2021-09-21 – 2021-09-22 (×2): 70 mg via SUBCUTANEOUS
  Filled 2021-09-21 (×3): qty 0.7

## 2021-09-21 MED ORDER — CHLORHEXIDINE GLUCONATE CLOTH 2 % EX PADS
6.0000 | MEDICATED_PAD | Freq: Every day | CUTANEOUS | Status: DC
  Administered 2021-09-21 – 2021-09-25 (×5): 6 via TOPICAL

## 2021-09-21 MED ORDER — SODIUM CHLORIDE 0.9 % IV SOLN
1.0000 g | INTRAVENOUS | Status: DC
  Administered 2021-09-21 – 2021-09-22 (×2): 1 g via INTRAVENOUS
  Filled 2021-09-21 (×2): qty 10

## 2021-09-21 MED ORDER — LACTATED RINGERS IV SOLN: INTRAVENOUS | Status: DC

## 2021-09-21 NOTE — Plan of Care (Signed)
Patient very lethargic throughout the day. Opens eyes when calling his name. Cough when repositioning. Very congested, non productive. VSS. Remains on 3L. Foley intact. Call bell within reach. Bed alarm on.  Problem: Clinical Measurements: Goal: Ability to maintain clinical measurements within normal limits will improve Outcome: Not Progressing Goal: Will remain free from infection Outcome: Not Progressing Goal: Diagnostic test results will improve Outcome: Not Progressing Goal: Respiratory complications will improve Outcome: Not Progressing Goal: Cardiovascular complication will be avoided Outcome: Not Progressing   Problem: Nutrition: Goal: Adequate nutrition will be maintained Outcome: Not Progressing   Problem: Activity: Goal: Risk for activity intolerance will decrease Outcome: Not Progressing   Problem: Elimination: Goal: Will not experience complications related to bowel motility Outcome: Not Progressing Goal: Will not experience complications related to urinary retention Outcome: Not Progressing   Problem: Pain Managment: Goal: General experience of comfort will improve Outcome: Not Progressing   Problem: Safety: Goal: Ability to remain free from injury will improve Outcome: Not Progressing   Problem: Skin Integrity: Goal: Risk for impaired skin integrity will decrease Outcome: Not Progressing   Problem: Respiratory: Goal: Will maintain a patent airway Outcome: Not Progressing Goal: Complications related to the disease process, condition or treatment will be avoided or minimized Outcome: Not Progressing

## 2021-09-21 NOTE — Progress Notes (Signed)
° °Subjective: °No acute overnight events.  ° °Patient was seen at bedside during rounds today. He continues to be encephalopathic and is unable to engage in conversation. He is resting comfortably and appears to have no trouble with breathing on supplemental O2.   ° °Objective: ° °Vital signs in last 24 hours: °Vitals:  ° 09/16/2021 2339 09/21/21 0425 09/21/21 0809 09/21/21 1122  °BP: 130/65 101/72 103/63 109/65  °Pulse: 83 (!) 101 96 94  °Resp: 10 20 16 16  °Temp: 98.7 °F (37.1 °C) 99.4 °F (37.4 °C) 98.9 °F (37.2 °C) 99.8 °F (37.7 °C)  °TempSrc: Oral Oral Oral Oral  °SpO2: 94% 96% 98% 97%  °Weight:      °Height:      ° °Constitutional: alert, well-appearing, in NAD °HENT: Mouth dry, tongue scabby, blepharitis °Eyes: conjunctiva non-erythematous, EOMI °Neck: Neck veins flat  °Cardiovascular: irregular rhythm and tachycardic, systolic murmur, non-edematous bilateral LE °Pulmonary/Chest: normal work of breathing on room air. Weak cough with congestion. Expriatory rhonchi most prominent on R anterior lung fields.  °Abdominal: soft, non-tender to palpation, non-distended. Periumbilical hernia, irreducible. Diffuse excorations of the abdomen.  °GU: Urine color clear no sediment in fleybag  °MSK: normal bulk and tone °Neurological: A&O x 3 °Skin: warm and dry, heels with good cap refills. Left arm is warm to touch today. Irregular dirtrubuting macular rash with areas of confluence of the arms and legs, not palpable. Tinea of bilateral feet, chemosis bilaterally, plaques of feet.  ° °Assessment/Plan: ° °Principal Problem: °  Encephalopathy ° °Acute metabolic encephalopathy in setting of UTI, bacteremia and COVID infection  °Febrile to 102F on admission in the setting of UT and known COVID infection. Was started on broad spectrum abx and decadron for COVID d/t O2 requirement and multiple comorbidities. No fevers since last night, satting 98% on 2-3L, and HDS otherwise. Inflammatory markers elevated this morning with ESR 53,  CRP 26, D dimer 2.1. Ferritin was 234. BC this morning showing GPC 4/4 bottles and ID panel showing MRSA. Of note, both sets are recorded as taken from right hand, but unsure if this is accurate; will consider repeating blood cultures tmrw. Unsure if this is true bacteremia or what the source is, and/or if UTI is the main culprit or COVID infection. We will continue appropriate tx for these infections, and continue to assess progress. Called son and provided an update.   °- Continue Vancomycin  °- Cefepime changed to Rocephin given concern for allergic rxn given macular rash on arms and legs today  °- Discontinued metronidazole  °- Continue IV Decadron 6 mg daily, day 2 °- Trend inflammatory markers for another day  °- Bolus another 1L IVF this morning °- Continue LR 100 cc/hr continuously  °- Urine culture showing multiple species present -> f/u on repeat urine culture   °- Follow up on blood cultures  °- Consider repeating blood cultures  °- NPO until passes bedside swallow  °- Resume flomax and finasteride after passing swallow screen  °  °Cool left upper extremity, resolved  °Left upper extremity which is cool to touch with delayed capillary refill on day of admission. Concerned this may be an arterial embolism, so heparin started and vascular upper extremity duplex ordered which came back negative. This morning, both arms are warm and dry to touch with no ongoing concern for cool extremities.  °- Heparin discontinued  °- Lovenox started for VTE ppx   °  °Diabetes mellitus type 2 °A1c 6.3 on 8/20 °- Holding   Holding metformin  - Continue sliding scale insulin - CBG monitoring    Chronic A. fib Tachycardic 110 in setting of infection, rates 90-100.  - Will continue home Coreg once passing swallow screen  - Patient is not on long-term anticoagulation due to prior history of GI bleed and liver cirrhosis.    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  -Continue Lasix and spironolactone after passing  bedside swallow screen  -Continue with maintenance fluids as above    HLD -Continue pravastatin after passing bedside swallow screen   Cirrhosis Confused but likely not from hepatic encephalopathy given possible source as mentioned above, and ammonia level is normal.  -Holding po lactulose after passing bedside swallow screen -Lactulose enema started     Lajean Manes, MD  Internal Medicine Resident, PGY-1 Pager: [phone] After 5pm on weekdays and 1pm on weekends: On Call pager [phone]

## 2021-09-21 NOTE — Progress Notes (Signed)
PHARMACY - PHYSICIAN COMMUNICATION CRITICAL VALUE ALERT - BLOOD CULTURE IDENTIFICATION (BCID)  Brandon [name] is an 76 y.o. male who presented to [facility] on 09/13/2021 with a chief complaint of acute encephalopathy   Assessment: Last temp 98.7, Tmax 102.5, WBC WNL  Name of physician (or Provider) Contacted: Dr. Lorin Glass  Current antibiotics: Vancomycin, Cefepime, Flagyl  Changes to prescribed antibiotics recommended:  None for now with concern for UTI as well DC Cefepime if urine culture negative   Results for orders placed or performed during the hospital encounter of 09/03/2021  Blood Culture ID Panel (Reflexed) (Collected: 09/11/2021 12:30 PM)  Result Value Ref Range   Enterococcus faecalis NOT DETECTED NOT DETECTED   Enterococcus Faecium NOT DETECTED NOT DETECTED   Listeria monocytogenes NOT DETECTED NOT DETECTED   Staphylococcus species DETECTED (A) NOT DETECTED   Staphylococcus aureus (BCID) DETECTED (A) NOT DETECTED   Staphylococcus epidermidis NOT DETECTED NOT DETECTED   Staphylococcus lugdunensis NOT DETECTED NOT DETECTED   Streptococcus species NOT DETECTED NOT DETECTED   Streptococcus agalactiae NOT DETECTED NOT DETECTED   Streptococcus pneumoniae NOT DETECTED NOT DETECTED   Streptococcus pyogenes NOT DETECTED NOT DETECTED   A.calcoaceticus-baumannii NOT DETECTED NOT DETECTED   Bacteroides fragilis NOT DETECTED NOT DETECTED   Enterobacterales NOT DETECTED NOT DETECTED   Enterobacter cloacae complex NOT DETECTED NOT DETECTED   Escherichia coli NOT DETECTED NOT DETECTED   Klebsiella aerogenes NOT DETECTED NOT DETECTED   Klebsiella oxytoca NOT DETECTED NOT DETECTED   Klebsiella pneumoniae NOT DETECTED NOT DETECTED   Proteus species NOT DETECTED NOT DETECTED   Salmonella species NOT DETECTED NOT DETECTED   Serratia marcescens NOT DETECTED NOT DETECTED   Haemophilus influenzae NOT DETECTED NOT DETECTED   Neisseria meningitidis NOT DETECTED NOT DETECTED   Pseudomonas  aeruginosa NOT DETECTED NOT DETECTED   Stenotrophomonas maltophilia NOT DETECTED NOT DETECTED   Candida albicans NOT DETECTED NOT DETECTED   Candida auris NOT DETECTED NOT DETECTED   Candida glabrata NOT DETECTED NOT DETECTED   Candida krusei NOT DETECTED NOT DETECTED   Candida parapsilosis NOT DETECTED NOT DETECTED   Candida tropicalis NOT DETECTED NOT DETECTED   Cryptococcus neoformans/gattii NOT DETECTED NOT DETECTED   Meth resistant mecA/C and MREJ DETECTED (A) NOT DETECTED    Narda Bonds 09/21/2021  1:04 AM

## 2021-09-21 NOTE — Progress Notes (Signed)
[place] for Heparin Indication: atrial fibrillation and Possible L arterial embolus  No Known Allergies  Patient Measurements: Height: 6' (182.9 cm) Weight: 122.5 kg (270 lb) IBW/kg (Calculated) : 77.6 Heparin Dosing Weight: 104.6  Vital Signs: Temp: 99.8 F (37.7 C) (02/25 1122) Temp Source: Oral (02/25 1122) BP: 109/65 (02/25 1122) Pulse Rate: 94 (02/25 1122)  Labs: Recent Labs    08/31/2021 1230 09/14/2021 2012 09/21/21 0102 09/21/21 1057  HGB 14.9 13.9 11.9*  --   HCT 48.1 41.0 38.8*  --   PLT PLATELET CLUMPS NOTED ON SMEAR, UNABLE TO ESTIMATE  --  53*  --   APTT 29  --   --   --   LABPROT 15.4*  --   --   --   INR 1.2  --   --   --   HEPARINUNFRC  --   --  0.86* 0.65  CREATININE 1.47*  --  1.42*  --      Estimated Creatinine Clearance: 61.7 mL/min (A) (by C-G formula based on SCr of 1.42 mg/dL (H)).   Medical History: Past Medical History:  Diagnosis Date   Anemia 07/2019   Hb 6.7 >> 3 PRBCs >> 8.8 in 07/2019.      Atrial fibrillation ([place])    Cholelithiasis    Cirrhosis of liver ([place])    Presumed due to EtOH as well as fatty liver from morbid obesity.  Cirrhosis evident on CT scan 07/2018 but finding overlooked and formal diagnoses not made until 07/2019   Colon cancer 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   Past Week   Amino Acids-Protein Hydrolys (FEEDING SUPPLEMENT, PRO-STAT SUGAR FREE 64,) LIQD Take 30 mLs by mouth 3 (three) times daily with  meals.   09/02/2021   carvedilol (COREG) 25 MG tablet Take 25 mg by mouth 2 (two) times daily with a meal.   09/02/2021 at 0900   enoxaparin (LOVENOX) 30 MG/0.3ML injection Inject 30 mg into the skin daily. This is taken in addition to the 40mg  dose, for a total of 70mg  per day   09/19/2021 at 2100   enoxaparin (LOVENOX) 40 MG/0.4ML injection Inject 40 mg into the skin daily. This is taken in addition to the 30mg  dose, for a total of 70mg  per day   09/19/2021 at 2100   finasteride (PROSCAR) 5 MG tablet Take 1 tablet (5 mg total) by mouth daily. 30 tablet 0 09/13/2021   furosemide (LASIX) 20 MG tablet Take 20 mg by mouth 2 (two) times daily.   09/17/2021   gabapentin (NEURONTIN) 300 MG capsule Take 1 capsule (300 mg total) by mouth 3 (three) times daily.   09/02/2021   hydrOXYzine (VISTARIL) 25 MG capsule Take 25 mg by mouth 3 (three) times daily as needed for itching.   09/19/2021   lactulose (CHRONULAC) 10 GM/15ML solution Take 30 mLs (20 g total) by mouth daily. 236 mL 0 09/13/2021   metFORMIN (  GLUCOPHAGE-XR) 500 MG 24 hr tablet Take 500 mg by mouth daily with breakfast.    09/16/2021   ondansetron (ZOFRAN) 4 MG tablet Take 1 tablet (4 mg total) by mouth every 6 (six) hours as needed for nausea. 20 tablet 0 Past Week   pantoprazole (PROTONIX) 40 MG tablet Take 1 tablet (40 mg total) by mouth daily. 30 tablet 11 08/30/2021   rosuvastatin (CRESTOR) 5 MG tablet Take 5 mg by mouth daily.   09/19/2021   sertraline (ZOLOFT) 25 MG tablet Take 1 tablet (25 mg total) by mouth daily. 30 tablet 2 09/24/2021   spironolactone (ALDACTONE) 50 MG tablet Take 50 mg by mouth daily.   09/19/2021   tamsulosin (FLOMAX) 0.4 MG CAPS capsule Take 2 capsules (0.8 mg total) by mouth at bedtime. 60 capsule 0 09/19/2021   triamcinolone cream (KENALOG) 0.1 % Apply 1 application topically 3 (three) times daily.   unk   vitamin C (ASCORBIC ACID) 500 MG tablet Take 500 mg by mouth 2 (two) times daily.   09/08/2021   zinc sulfate 220 (50 Zn) MG  capsule Take 220 mg by mouth daily.   09/22/2021   pravastatin (PRAVACHOL) 10 MG tablet Take 10 mg by mouth at bedtime. (Patient not taking: Reported on 09/24/2021)   Not Taking    Scheduled:   Chlorhexidine Gluconate Cloth  6 each Topical Daily   dexamethasone (DECADRON) injection  6 mg Intravenous Q24H   insulin aspart  0-9 Units Subcutaneous TID WC   Infusions:   cefTRIAXone (ROCEPHIN)  IV 1 g (09/21/21 1132)   lactated ringers 100 mL/hr at 09/21/21 1128   vancomycin     PRN:   Assessment: 58 yom with a history of CAD, colon cancer, T2DM, cirrhosis, A-fib, portal hypertension, morbid obesity with chronic indwelling catheter. Patient is presenting with AMS from SNF. Heparin per pharmacy consult placed for atrial fibrillation and Possible L arterial embolus .  Patient is not on therapeutic anticoagulation prior to arrival (was receiving 70mg  enoxaparin q24h at facility - last dose 2/23)  Heparin level is therapeutic this morning at 0.65 after decreasing heparin to 1300 units/hr.    Goal of Therapy:  Heparin level 0.3-0.7 units/ml Monitor platelets by anticoagulation protocol: Yes   Plan:  Continue heparin 1300 units/hr Check anti-Xa level daily while on heparin Continue to monitor H&H and platelets    Thank you for allowing [place] to participate in this patients care. Jens Som, PharmD 09/21/2021 12:26 PM  **Pharmacist phone directory can be found on [email or web address] listed under [place]**

## 2021-09-21 NOTE — Progress Notes (Signed)
[place] for Heparin Indication: atrial fibrillation and Possible L arterial embolus  No Known Allergies  Patient Measurements: Height: 6' (182.9 cm) Weight: 122.5 kg (270 lb) IBW/kg (Calculated) : 77.6 Heparin Dosing Weight: 104.6  Vital Signs: Temp: 98.7 F (37.1 C) (02/24 2339) Temp Source: Oral (02/24 2339) BP: 130/65 (02/24 2339) Pulse Rate: 83 (02/24 2339)  Labs: Recent Labs    09/07/2021 1230 09/12/2021 2012 09/21/21 0102  HGB 14.9 13.9 11.9*  HCT 48.1 41.0 38.8*  PLT PLATELET CLUMPS NOTED ON SMEAR, UNABLE TO ESTIMATE  --  53*  APTT 29  --   --   LABPROT 15.4*  --   --   INR 1.2  --   --   HEPARINUNFRC  --   --  0.86*  CREATININE 1.47*  --  1.42*     Estimated Creatinine Clearance: 61.7 mL/min (A) (by C-G formula based on SCr of 1.42 mg/dL (H)).   Medical History: Past Medical History:  Diagnosis Date   Anemia 07/2019   Hb 6.7 >> 3 PRBCs >> 8.8 in 07/2019.      Atrial fibrillation ([place])    Cholelithiasis    Cirrhosis of liver ([place])    Presumed due to EtOH as well as fatty liver from morbid obesity.  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   Past Week   Amino Acids-Protein Hydrolys (FEEDING SUPPLEMENT, PRO-STAT SUGAR FREE 64,) LIQD Take 30 mLs by mouth 3 (three) times daily with meals.   08/30/2021   carvedilol (COREG) 25 MG tablet  Take 25 mg by mouth 2 (two) times daily with a meal.   09/19/2021 at 0900   enoxaparin (LOVENOX) 30 MG/0.3ML injection Inject 30 mg into the skin daily. This is taken in addition to the 40mg  dose, for a total of 70mg  per day   09/19/2021 at 2100   enoxaparin (LOVENOX) 40 MG/0.4ML injection Inject 40 mg into the skin daily. This is taken in addition to the 30mg  dose, for a total of 70mg  per day   09/19/2021 at 2100   finasteride (PROSCAR) 5 MG tablet Take 1 tablet (5 mg total) by mouth daily. 30 tablet 0 09/11/2021   furosemide (LASIX) 20 MG tablet Take 20 mg by mouth 2 (two) times daily.   09/10/2021   gabapentin (NEURONTIN) 300 MG capsule Take 1 capsule (300 mg total) by mouth 3 (three) times daily.   09/16/2021   hydrOXYzine (VISTARIL) 25 MG capsule Take 25 mg by mouth 3 (three) times daily as needed for itching.   09/19/2021   lactulose (CHRONULAC) 10 GM/15ML solution Take 30 mLs (20 g total) by mouth daily. 236 mL 0 09/04/2021   metFORMIN (GLUCOPHAGE-XR) 500 MG 24 hr tablet Take 500 mg by mouth daily with breakfast.    08/31/2021   ondansetron (ZOFRAN) 4 MG  tablet Take 1 tablet (4 mg total) by mouth every 6 (six) hours as needed for nausea. 20 tablet 0 Past Week   pantoprazole (PROTONIX) 40 MG tablet Take 1 tablet (40 mg total) by mouth daily. 30 tablet 11 09/02/2021   rosuvastatin (CRESTOR) 5 MG tablet Take 5 mg by mouth daily.   09/19/2021   sertraline (ZOLOFT) 25 MG tablet Take 1 tablet (25 mg total) by mouth daily. 30 tablet 2 09/24/2021   spironolactone (ALDACTONE) 50 MG tablet Take 50 mg by mouth daily.   09/19/2021   tamsulosin (FLOMAX) 0.4 MG CAPS capsule Take 2 capsules (0.8 mg total) by mouth at bedtime. 60 capsule 0 09/19/2021   triamcinolone cream (KENALOG) 0.1 % Apply 1 application topically 3 (three) times daily.   unk   vitamin C (ASCORBIC ACID) 500 MG tablet Take 500 mg by mouth 2 (two) times daily.   08/31/2021   zinc sulfate 220 (50 Zn) MG capsule Take 220 mg by mouth daily.   09/05/2021    pravastatin (PRAVACHOL) 10 MG tablet Take 10 mg by mouth at bedtime. (Patient not taking: Reported on 08/28/2021)   Not Taking    Scheduled:   dexamethasone (DECADRON) injection  6 mg Intravenous Q24H   Infusions:   ceFEPime (MAXIPIME) IV Stopped (09/21/2021 2155)   heparin 1,500 Units/hr (09/07/2021 1902)   lactated ringers 150 mL/hr at 09/19/2021 2125   metronidazole Stopped (08/30/2021 2226)   vancomycin     PRN:   Assessment: 65 yom with a history of CAD, colon cancer, T2DM, cirrhosis, A-fib, portal hypertension, morbid obesity with chronic indwelling catheter. Patient is presenting with AMS from SNF. Heparin per pharmacy consult placed for atrial fibrillation and Possible L arterial embolus .  Patient is not on therapeutic anticoagulation prior to arrival (was receiving 70mg  enoxaparin q24h at facility - last dose 2/23)  Hgb 14.9; plt clumps noted on smear  2/25 AM update:  Heparin level elevated   Goal of Therapy:  Heparin level 0.3-0.7 units/ml Monitor platelets by anticoagulation protocol: Yes   Plan:  Dec heparin to 1300 units/hr 1100 heparin level  Narda Bonds, PharmD, [place] Pharmacist Phone: [phone]

## 2021-09-21 NOTE — Care Management Obs Status (Signed)
[place] NOTIFICATION   Patient Details  Name: Brandon [name] MRN: [id number] Date of Birth: January 03, 1946   Medicare Observation Status Notification Given:  Other (see comment) (Left voicemail for son to return call in order to give notice of observation status)    Bartholomew Crews, RN 09/21/2021, 4:42 PM

## 2021-09-21 NOTE — Hospital Course (Addendum)
Acute metabolic encephalopathy in setting of MRSA bacteremia, COVID infection and hx of cirrhosis (2/24/) New focal neurological deficits (2/28) Ongoing encephalopathy thought to be due to MRSA bacteremia and possibly COVID infection, however concerning given no improvement with 4 days of tx with abx and IV decadron and remdesivir. Repeat BC with NGTD. Was concerned that this may be hepatic encephalopathy given nursing staff unable to admin enemas; NGT placed 2/27 and has been receiving lactulose since then. New focal neurological deficits 2/28 with right sided facial droop and favoring right extremities. 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 Neurology consulted. CTA head and neck showing patent right superior cerebellar and basilar artery; mild to mod stenosis distal right vertebral artery at the level of PICA origin with right PICA not visualized. MRI showing ***. TTE not suggestive of endocarditis but does have possible bicuspid AoV, requiring TEE once more stable given possibility septic emboli, though pattern is not embolic in nature. Given overall no improvement since admission with decline in mentation and new neurological deficits, PCCM consulted; PCCM spoke with neurology, who agrees that this pt does not require ICU level care and PPCM in agreement given no increasing O2 requirement and pt's DNR/DNI status limited intervention/options they can provide.   Extensive conversations with son providing him updates, including anticipated poor outcomes, and son is appreciative; continued DNR/DNI.     Hypernatremia Initially Na up to 155, which improved to 151 after D5W. Spoke with neuro after new onset stroke, who recommends maintaining Na between 145-155 and to avoid abrupt decline in Na to avoid worsening mass effect from stroke, recommending normal saline and D5 as needed if Na goes above our  goal of 145-155. Na *** at time of discharge.    T2DM A1c 6.3 on 8/20. CBGs elevated 250's today in the setting of decadron use.  - Holding metformin  - increased to moderate SSI - CBG monitoring    Chronic A. fib Rates 90-100 in the setting of infection.  - Held home Coreg initially as pt encephalopathic and would have been unable to pass swallow screen. *** - Patient is not on long-term anticoagulation due to prior history of GI bleed and liver cirrhosis.    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  -Continue Lasix and spironolactone after passing bedside swallow screen  -Continue with maintenance fluids as above     HLD -Continue pravastatin after passing bedside swallow screen

## 2021-09-22 ENCOUNTER — Inpatient Hospital Stay (HOSPITAL_COMMUNITY): Payer: Medicare Other

## 2021-09-22 DIAGNOSIS — U071 COVID-19: Secondary | ICD-10-CM | POA: Diagnosis not present

## 2021-09-22 DIAGNOSIS — N390 Urinary tract infection, site not specified: Secondary | ICD-10-CM | POA: Diagnosis not present

## 2021-09-22 DIAGNOSIS — G9340 Encephalopathy, unspecified: Secondary | ICD-10-CM | POA: Diagnosis not present

## 2021-09-22 DIAGNOSIS — R7881 Bacteremia: Secondary | ICD-10-CM | POA: Diagnosis not present

## 2021-09-22 LAB — CBC
HCT: 37.9 % — ABNORMAL LOW (ref 39.0–52.0)
Hemoglobin: 11.5 g/dL — ABNORMAL LOW (ref 13.0–17.0)
MCH: 27.2 pg (ref 26.0–34.0)
MCHC: 30.3 g/dL (ref 30.0–36.0)
MCV: 89.6 fL (ref 80.0–100.0)
Platelets: 66 10*3/uL — ABNORMAL LOW (ref 150–400)
RBC: 4.23 MIL/uL (ref 4.22–5.81)
RDW: 15.1 % (ref 11.5–15.5)
WBC: 6.8 10*3/uL (ref 4.0–10.5)
nRBC: 0 % (ref 0.0–0.2)

## 2021-09-22 LAB — D-DIMER, QUANTITATIVE: D-Dimer, Quant: 1.45 ug/mL-FEU — ABNORMAL HIGH (ref 0.00–0.50)

## 2021-09-22 LAB — BASIC METABOLIC PANEL
Anion gap: 8 (ref 5–15)
BUN: 66 mg/dL — ABNORMAL HIGH (ref 8–23)
CO2: 27 mmol/L (ref 22–32)
Calcium: 9.7 mg/dL (ref 8.9–10.3)
Chloride: 114 mmol/L — ABNORMAL HIGH (ref 98–111)
Creatinine, Ser: 1.37 mg/dL — ABNORMAL HIGH (ref 0.61–1.24)
GFR, Estimated: 54 mL/min — ABNORMAL LOW (ref >60)
Glucose, Bld: 203 mg/dL — ABNORMAL HIGH (ref 70–99)
Potassium: 4.3 mmol/L (ref 3.5–5.1)
Sodium: 149 mmol/L — ABNORMAL HIGH (ref 135–145)

## 2021-09-22 LAB — C-REACTIVE PROTEIN: CRP: 20 mg/dL — ABNORMAL HIGH (ref <1.0)

## 2021-09-22 LAB — GLUCOSE, CAPILLARY
Glucose-Capillary: 183 mg/dL — ABNORMAL HIGH (ref 70–99)
Glucose-Capillary: 181 mg/dL — ABNORMAL HIGH (ref 70–99)
Glucose-Capillary: 176 mg/dL — ABNORMAL HIGH (ref 70–99)
Glucose-Capillary: 183 mg/dL — ABNORMAL HIGH (ref 70–99)

## 2021-09-22 LAB — FERRITIN: Ferritin: 284 ng/mL (ref 24–336)

## 2021-09-22 LAB — SEDIMENTATION RATE: Sed Rate: 45 mm/hr — ABNORMAL HIGH (ref 0–16)

## 2021-09-22 IMAGING — DX DG CHEST 1V PORT
1 series · 1 of 1 positions shown · non-contrast
Comparison: Previous studies including the examination of
[DATE]

CLINICAL DATA: Shortness of breath

EXAM:
PORTABLE CHEST 1 VIEW

[chest]
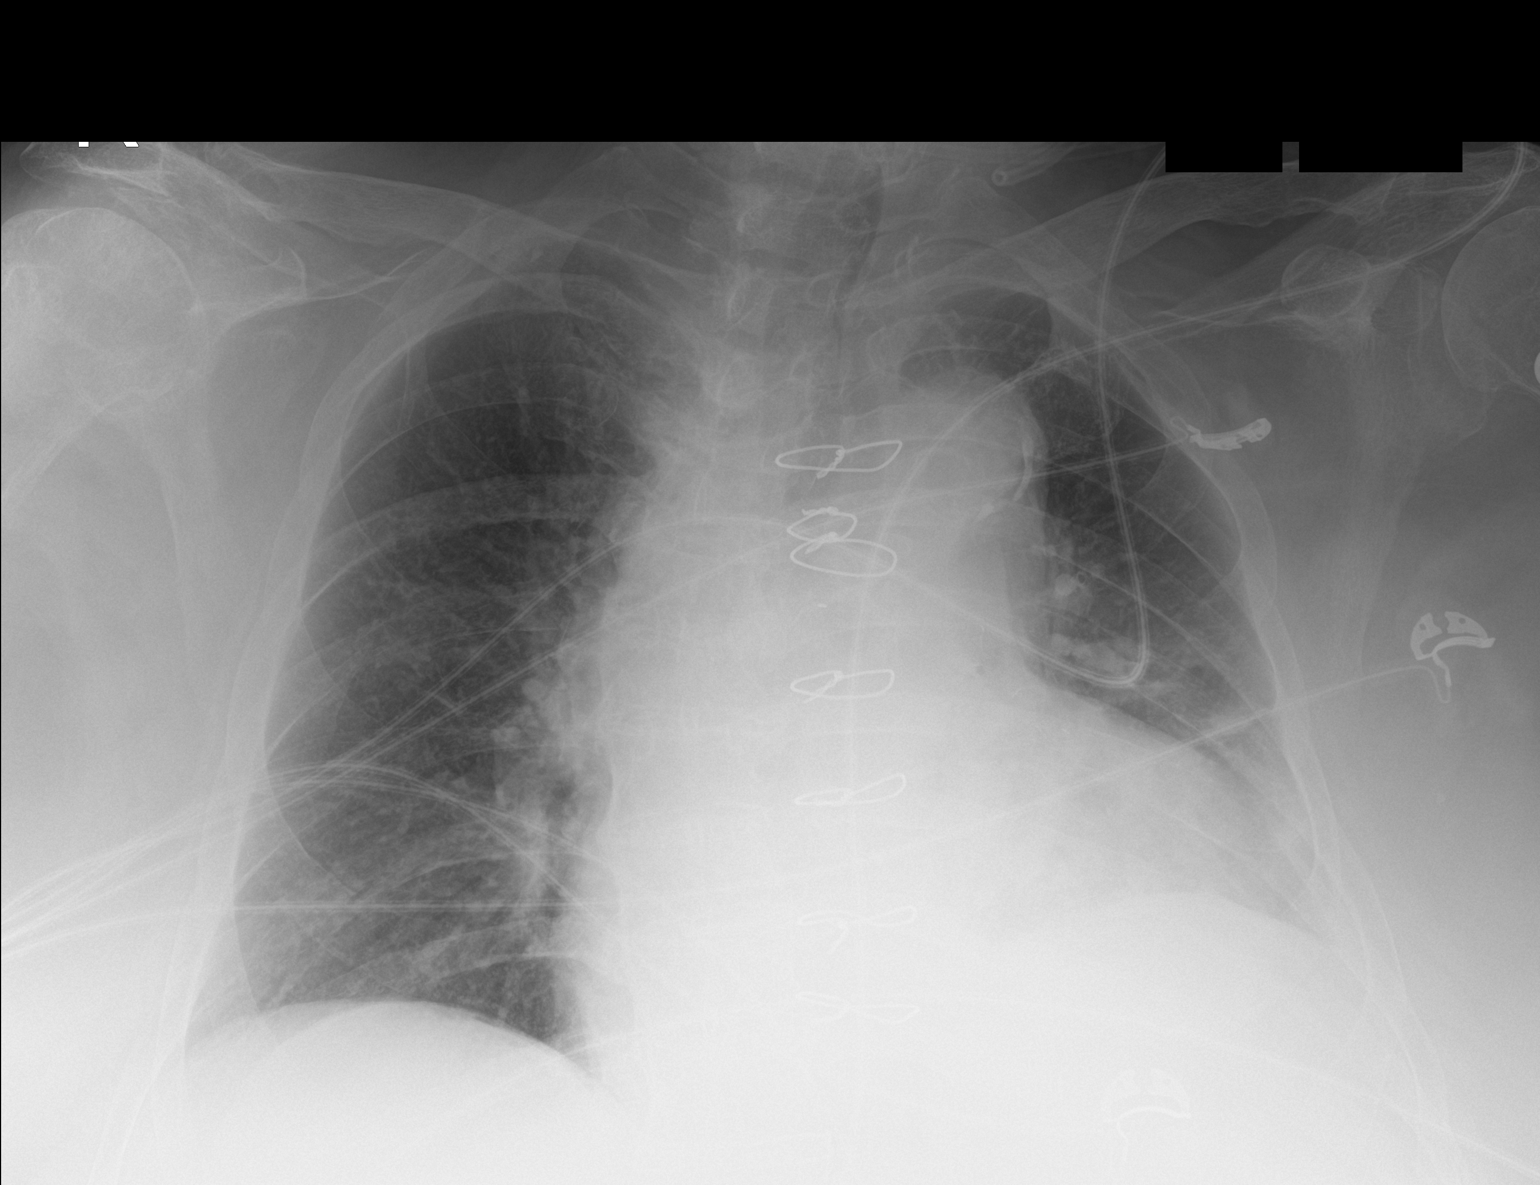

[1 of 1 positions shown; findings below may reference images not displayed]

FINDINGS: Transverse diameter of heart is increased. Metallic sutures are seen
in the sternum. There are no signs of alveolar pulmonary edema. Left
hemidiaphragm is elevated. There is blunting of left lateral CP
angle.
IMPRESSION: Cardiomegaly. There are no signs of pulmonary edema or new focal
infiltrates. Blunting of left lateral CP angle suggests possible
small effusion.

## 2021-09-22 NOTE — Progress Notes (Signed)
Subjective: No acute overnight events.   Patient was seen at bedside during rounds today. He continues to be encephalopathic and is unable to engage in conversation. He is moaning occasionally and appears to have no trouble with breathing on supplemental O2.    I visited him again this afternoon and findings are largely unchanged. He responds to stimuli.   Objective:  Vital signs in last 24 hours: Vitals:   09/22/21 1000 09/22/21 1200 09/22/21 1606 09/22/21 2009  BP:  114/72 121/80   Pulse:  (!) 102 100   Resp: 16 20 17    Temp:  99 F (37.2 C) 99.1 F (37.3 C) 99.4 F (37.4 C)  TempSrc:  Axillary Axillary Axillary  SpO2:  96% 95%   Weight:      Height:       Constitutional: chronically ill appearing, in NAD HENT: Mouth dry, tongue scabby, blepharitis Eyes: conjunctiva non-erythematous, EOMI Neck: Neck veins flat  Cardiovascular: irregular rhythm and tachycardic, systolic murmur, non-edematous bilateral LE Pulmonary/Chest: normal work of breathing on supplemental O2. Weak cough with congestion. LCTAB.  Abdominal: soft, non-tender to palpation, non-distended MSK: normal bulk and tone Neurological: ongoing encephalopathy  Skin: warm and dry, heels with good cap refills. Improved erythema of lower extremities with some desqumation. Rash of arms and legs improved. Tinea of bilateral feet, chemosis bilaterally, plaques of feet.   Assessment/Plan:  Principal Problem:   Encephalopathy Active Problems:   Bacteremia  Acute metabolic encephalopathy in setting of MRSA bacteremia and COVID infection Temp 99-99.6 today. Suspect this is d/t MRSA bacteremia on admission BC (unclear etiology) and possibly COVID infection. CAUTI unlikely given UA not impressive for a single organism and alternative more likely (bacteremia). ID following, currently on vancomycin. TTE not suggestive of endocarditis-- may need TEE once more stable. Repeat BC with no growth at 24 hours. From COVID  standpoint, stable to improved respiratory status with sats >96% on 3L, and inflammatory markers trended down. ABG without hypercarbia.  - ID following, appreciate assistance  - Continue Vancomycin  - Continue IV Decadron 6 mg daily, day 4 - Discontinued LR 100 cc/hr  - Started D5W 125 cc/hr for given hyponatremia (see below)  - Follow up on repeat blood cultures  - Follow up on repeat urine culture   - NPO until passes bedside swallow    Cirrhosis Confused but not convinced HE given medication compliance prior to admission, but difficult to be certain. Ammonia wnl on admission. Will double his lactulose enema dose from daily to BID.  -Holding po lactulose until after passing bedside swallow screen -Lactulose enema started, will increase to BID.   Hypernatremia Na up-trending, 149 -> 155, suspect this is from free water deficit. His free water deficit is 4L. Goal correction is 8 mmol/L within 24 hours. -Discontinued LR 100 cc/hr  -Started D5W 125 cc/hr for 24 hours   -BMP q6h   T2DM A1c 6.3 on 8/20 - Holding metformin  - Continue SSI - CBG monitoring    Chronic A. fib Rates 90-100 in the setting of infection.  - Continue home Coreg once passing swallow screen  - Patient is not on long-term anticoagulation due to prior history of GI bleed and liver cirrhosis.    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  -Continue Lasix and spironolactone after passing bedside swallow screen  -Continue with maintenance fluids as above    HLD -Continue pravastatin after passing bedside swallow screen    Lajean Manes, MD  Internal Medicine Resident, PGY-1 [facility]  Pager: [phone]

## 2021-09-22 NOTE — TOC Initial Note (Addendum)
Transition of Care [facility]) - Initial/Assessment Note    Patient Details  Name: Brandon [name] MRN: [id number] Date of Birth: 04/11/1946  [facility] CM/SW Contact:    Bartholomew Crews, RN Phone Number: [phone] 09/22/2021, 5:02 PM  Clinical Narrative:                  Spoke with patient's son, Brandon [name], on the phone to follow up with missed call yesterday. Patient is a long term resident at [facility] d/t mobility issues stating that patient cannot get out of bed without assistance. Brandon [name] is not happy with care being received and asked about other options. Discussed that he can check out other facilities, but the hospital is not able to assist with coordination of new placement. TOC following for transition needs.   1720: Received call from patient's son, Brandon [name], to further discuss concerns at nursing facility stating that patient's foley care is not being done and foley bag is not being emptied. Brandon [name] stated that patient's wife is at [facility] long term care and asked if we can assist with having patient to transition there. TOC to f/u.   Expected Discharge Plan: Long Term Nursing Home Barriers to Discharge: Continued Medical Work up   Patient Goals and CMS Choice Patient states their goals for this hospitalization and ongoing recovery are:: [facility].gov Compare Post Acute Care list provided to:: Patient Represenative (must comment) Brandon [name], son) Choice offered to / list presented to : Adult Children  Expected Discharge Plan and Services Expected Discharge Plan: Long Term Nursing Home In-house Referral: Clinical Social Work   Post Acute Care Choice: Nursing Home Living arrangements for the past 2 months: [facility]                                      Prior Living Arrangements/Services Living arrangements for the past 2 months: Early Lives with:: Facility Resident                    Activities of Daily Living      Permission Sought/Granted                  Emotional Assessment              Admission diagnosis:  Encephalopathy [G93.40] Acute cystitis without hematuria [N30.00] Altered mental status, unspecified altered mental status type [R41.82] COVID-19 virus infection [U07.1] Bacteremia [R78.81] Patient Active Problem List   Diagnosis Date Noted   Bacteremia 09/21/2021   Encephalopathy 47/65/4650   Acute metabolic encephalopathy 35/46/5681   Pressure injury of skin 03/16/2021   UTI (urinary tract infection) 03/16/2021   Generalized weakness 03/15/2021   Disp supracondyl fx w/o intracondylar extension of right distal femur ([place]) 02/05/2021   Hypomagnesemia 02/04/2021   Fall at home, initial encounter 02/04/2021   Cirrhosis of liver (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   Alcoholic cirrhosis of liver with ascites ([place])    Portal hypertensive gastropathy ([place])    Melena 10/02/2019   Angiodysplasia of stomach with hemorrhage    Gastrointestinal hemorrhage    Atrial fibrillation ([facility])    Chronic pain syndrome    Symptomatic anemia 08/13/2019  Acute upper GI bleed 08/13/2019   Anemia 07/2019   Avulsion of right patellar tendon    Closed displaced fracture of phalanx of toe of right foot 10/06/2018   Obesity BMI over 52 09/30/2018   Patella fracture, extensor mechanism disruption R knee  09/29/2018   History of colon cancer 08/23/2018   CAD (coronary artery disease) 08/23/2018   DM (diabetes mellitus), type 2 ([place]) 08/23/2018   Pyuria 08/23/2018   PCP:  Helane Rima, MD Pharmacy:   [id number] - [place], [place] - [address] AT [place] [address] [place]  [address] Phone: [phone] Fax: [phone]     Social Determinants of Health (SDOH) Interventions    Readmission Risk Interventions Readmission Risk Prevention Plan 03/21/2021 10/06/2019 10/06/2019  Transportation Screening Complete - Complete  PCP or Specialist Appt within 3-5 Days Complete Complete -  HRI or Home Care Consult Complete - Complete  Social Work Consult for [place] Planning/Counseling Complete - Complete  Palliative Care Screening Complete - Not Applicable  Medication Review Press photographer) Complete - Complete  Some recent data might be hidden

## 2021-09-22 NOTE — Progress Notes (Signed)
This nurse was able to preform oral care on pt.

## 2021-09-22 NOTE — Progress Notes (Signed)
° °  Subjective: No acute overnight events.   Patient was seen at bedside during rounds today. He continues to be encephalopathic and is unable to engage in conversation. He is moaning occasionally and appears to have no trouble with breathing on supplemental O2.    Objective:  Vital signs in last 24 hours: Vitals:   09/22/21 0900 09/22/21 1000 09/22/21 1200 09/22/21 1606  BP:   114/72 121/80  Pulse:   (!) 102 100  Resp:  16 20 17   Temp:   99 F (37.2 C) 99.1 F (37.3 C)  TempSrc:   Axillary Axillary  SpO2: 96%  96% 95%  Weight:      Height:       Constitutional: alert, well-appearing, in NAD HENT: Mouth dry, tongue scabby, blepharitis Eyes: conjunctiva non-erythematous, EOMI Neck: Neck veins flat  Cardiovascular: irregular rhythm and tachycardic, systolic murmur, non-edematous bilateral LE Pulmonary/Chest: normal work of breathing on room air. Weak cough with congestion. Expriatory rhonchi most prominent on R anterior lung fields.  Abdominal: soft, non-tender to palpation, non-distended. Periumbilical hernia, irreducible. Diffuse excorations of the abdomen.  GU: Urine color clear no sediment in fleybag  MSK: normal bulk and tone Neurological: A&O x 3 Skin: warm and dry, heels with good cap refills. Improved erythema of lower extremities with some desqumation. Rash of arms and legs improved. Tinea of bilateral feet, chemosis bilaterally, plaques of feet.   Assessment/Plan:  Principal Problem:   Encephalopathy Active Problems:   Bacteremia  Acute metabolic encephalopathy in setting of UTI, bacteremia and COVID infection  Febrile to 102F on admission in the setting of UTI and known COVID infection. Was initially started on broad spectrum abx for persistent fever despite appropriate coverage for UTI and decadron for COVID d/t O2 requirement. No fevers since 2/24 satting 98% on 2-3L, and HDS otherwise. Inflammatory markers downtrending. BCx showing GPC 4/4 bottles and BCID panel  showing MRSA. Of note, both sets are recorded as taken from right hand, but unsure if this is accurate. Unsure if this is true bacteremia or what the source is, and/or if UTI is the main culprit or COVID infection. Will narrow patient to vancomycin only for possible MRSA bacteremia and continue decadron.  - Continue Vancomycin  - Ceftriaxone discontinued.  - Continue IV Decadron 6 mg daily, day 3 - Continue LR 100 cc/hr continuously  - Urine culture showing multiple species present -> f/u on repeat urine culture   - Follow up on repeat blood cultures  - NPO until passes bedside swallow    Diabetes mellitus type 2 A1c 6.3 on 8/20 - Holding metformin  - Continue sliding scale insulin - CBG monitoring    Chronic A. fib Tachycardic 110 in setting of infection, rates 90-100.  - Will continue home Coreg once passing swallow screen  - Patient is not on long-term anticoagulation due to prior history of GI bleed and liver cirrhosis.    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  -Continue Lasix and spironolactone after passing bedside swallow screen  -Continue with maintenance fluids as above    HLD -Continue pravastatin after passing bedside swallow screen   Cirrhosis Confused but likely not from hepatic encephalopathy given sources noted above and ammonia level is normal.  -Holding po lactulose until after passing bedside swallow screen -Lactulose enema started     Rick Duff, MD PGY-2 Internal Medicine  Pager [phone]  After 5pm on weekdays and 1pm on weekends: On Call pager [phone]

## 2021-09-23 ENCOUNTER — Inpatient Hospital Stay (HOSPITAL_COMMUNITY): Payer: Medicare Other

## 2021-09-23 DIAGNOSIS — R7881 Bacteremia: Secondary | ICD-10-CM

## 2021-09-23 DIAGNOSIS — B9562 Methicillin resistant Staphylococcus aureus infection as the cause of diseases classified elsewhere: Secondary | ICD-10-CM

## 2021-09-23 DIAGNOSIS — K7682 Hepatic encephalopathy: Secondary | ICD-10-CM

## 2021-09-23 DIAGNOSIS — U071 COVID-19: Secondary | ICD-10-CM

## 2021-09-23 DIAGNOSIS — R4182 Altered mental status, unspecified: Secondary | ICD-10-CM

## 2021-09-23 LAB — BASIC METABOLIC PANEL
Anion gap: 7 (ref 5–15)
Anion gap: 5 (ref 5–15)
Anion gap: 10 (ref 5–15)
BUN: 67 mg/dL — ABNORMAL HIGH (ref 8–23)
BUN: 67 mg/dL — ABNORMAL HIGH (ref 8–23)
BUN: 66 mg/dL — ABNORMAL HIGH (ref 8–23)
CO2: 29 mmol/L (ref 22–32)
CO2: 29 mmol/L (ref 22–32)
CO2: 28 mmol/L (ref 22–32)
Calcium: 10.3 mg/dL (ref 8.9–10.3)
Calcium: 10.3 mg/dL (ref 8.9–10.3)
Calcium: 10 mg/dL (ref 8.9–10.3)
Chloride: 119 mmol/L — ABNORMAL HIGH (ref 98–111)
Chloride: 121 mmol/L — ABNORMAL HIGH (ref 98–111)
Chloride: 118 mmol/L — ABNORMAL HIGH (ref 98–111)
Creatinine, Ser: 1.34 mg/dL — ABNORMAL HIGH (ref 0.61–1.24)
Creatinine, Ser: 1.27 mg/dL — ABNORMAL HIGH (ref 0.61–1.24)
Creatinine, Ser: 1.2 mg/dL (ref 0.61–1.24)
GFR, Estimated: 56 mL/min — ABNORMAL LOW (ref >60)
GFR, Estimated: 59 mL/min — ABNORMAL LOW (ref >60)
GFR, Estimated: >60 mL/min (ref >60)
Glucose, Bld: 228 mg/dL — ABNORMAL HIGH (ref 70–99)
Glucose, Bld: 222 mg/dL — ABNORMAL HIGH (ref 70–99)
Glucose, Bld: 198 mg/dL — ABNORMAL HIGH (ref 70–99)
Potassium: 4.2 mmol/L (ref 3.5–5.1)
Potassium: 4.5 mmol/L (ref 3.5–5.1)
Potassium: 4.4 mmol/L (ref 3.5–5.1)
Sodium: 155 mmol/L — ABNORMAL HIGH (ref 135–145)
Sodium: 155 mmol/L — ABNORMAL HIGH (ref 135–145)
Sodium: 156 mmol/L — ABNORMAL HIGH (ref 135–145)

## 2021-09-23 LAB — TECHNOLOGIST SMEAR REVIEW: Plt Morphology: DECREASED

## 2021-09-23 LAB — BLOOD GAS, ARTERIAL
Acid-Base Excess: 5.1 mmol/L — ABNORMAL HIGH (ref 0.0–2.0)
Bicarbonate: 30.5 mmol/L — ABNORMAL HIGH (ref 20.0–28.0)
Drawn by: 59156
O2 Saturation: 98.9 %
Patient temperature: 37.6
pCO2 arterial: 48 mmHg (ref 32–48)
pH, Arterial: 7.41 (ref 7.35–7.45)
pO2, Arterial: 106 mmHg (ref 83–108)

## 2021-09-23 LAB — VANCOMYCIN, PEAK: Vancomycin Pk: 39 ug/mL (ref 30–40)

## 2021-09-23 LAB — ECHOCARDIOGRAM COMPLETE
AR max vel: 1.32 cm2
AV Area VTI: 1.31 cm2
AV Area mean vel: 1.2 cm2
AV Mean grad: 13 mmHg
AV Peak grad: 20.3 mmHg
AV Vena cont: 0.3 cm
Ao pk vel: 2.25 m/s
Calc EF: 52 %
Height: 72 in
P 1/2 time: 464 msec
S' Lateral: 3.3 cm
Single Plane A2C EF: 46.9 %
Single Plane A4C EF: 55.7 %
Weight: 4320 oz

## 2021-09-23 LAB — CULTURE, BLOOD (ROUTINE X 2)
Special Requests: ADEQUATE
Special Requests: ADEQUATE

## 2021-09-23 LAB — CBC
HCT: 36.2 % — ABNORMAL LOW (ref 39.0–52.0)
Hemoglobin: 11 g/dL — ABNORMAL LOW (ref 13.0–17.0)
MCH: 27.4 pg (ref 26.0–34.0)
MCHC: 30.4 g/dL (ref 30.0–36.0)
MCV: 90.3 fL (ref 80.0–100.0)
Platelets: 68 10*3/uL — ABNORMAL LOW (ref 150–400)
RBC: 4.01 MIL/uL — ABNORMAL LOW (ref 4.22–5.81)
RDW: 15.2 % (ref 11.5–15.5)
WBC: 7.3 10*3/uL (ref 4.0–10.5)
nRBC: 0 % (ref 0.0–0.2)

## 2021-09-23 LAB — GLUCOSE, CAPILLARY
Glucose-Capillary: 167 mg/dL — ABNORMAL HIGH (ref 70–99)
Glucose-Capillary: 186 mg/dL — ABNORMAL HIGH (ref 70–99)
Glucose-Capillary: 191 mg/dL — ABNORMAL HIGH (ref 70–99)
Glucose-Capillary: 213 mg/dL — ABNORMAL HIGH (ref 70–99)
Glucose-Capillary: 223 mg/dL — ABNORMAL HIGH (ref 70–99)

## 2021-09-23 LAB — URINE CULTURE: Culture: <10000 — AB

## 2021-09-23 LAB — IMMATURE PLATELET FRACTION: Immature Platelet Fraction: 4.1 % (ref 1.2–8.6)

## 2021-09-23 MED ORDER — LACTULOSE ENEMA
300.0000 mL | Freq: Two times a day (BID) | ORAL | Status: DC
  Filled 2021-09-23: qty 300

## 2021-09-23 MED ORDER — INSULIN ASPART 100 UNIT/ML IJ SOLN
0.0000 [IU] | INTRAMUSCULAR | Status: DC
  Administered 2021-09-23: 3 [IU] via SUBCUTANEOUS
  Administered 2021-09-23 (×2): 2 [IU] via SUBCUTANEOUS
  Administered 2021-09-24 (×2): 3 [IU] via SUBCUTANEOUS

## 2021-09-23 MED ORDER — LACTULOSE ENEMA
300.0000 mL | Freq: Once | ORAL | Status: DC
  Filled 2021-09-23: qty 300

## 2021-09-23 MED ORDER — LACTULOSE 10 GM/15ML PO SOLN
20.0000 g | Freq: Every day | ORAL | Status: DC
  Administered 2021-09-24 – 2021-09-25 (×7): 20 g
  Filled 2021-09-23 (×7): qty 30

## 2021-09-23 MED ORDER — SODIUM CHLORIDE 0.9 % IV SOLN
200.0000 mg | Freq: Once | INTRAVENOUS | Status: AC
  Administered 2021-09-23: 200 mg via INTRAVENOUS
  Filled 2021-09-23: qty 40

## 2021-09-23 MED ORDER — SODIUM CHLORIDE 0.9 % IV SOLN
100.0000 mg | Freq: Every day | INTRAVENOUS | Status: DC
  Administered 2021-09-24 – 2021-09-25 (×2): 100 mg via INTRAVENOUS
  Filled 2021-09-23 (×2): qty 20

## 2021-09-23 MED ORDER — ENOXAPARIN SODIUM 60 MG/0.6ML IJ SOSY
60.0000 mg | PREFILLED_SYRINGE | INTRAMUSCULAR | Status: DC
  Administered 2021-09-23 – 2021-09-24 (×2): 60 mg via SUBCUTANEOUS
  Filled 2021-09-23 (×2): qty 0.6

## 2021-09-23 MED ORDER — DEXTROSE 5 % IV SOLN
INTRAVENOUS | Status: DC
Start: 2021-09-23 — End: 2021-09-24

## 2021-09-23 NOTE — Progress Notes (Shared)
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

## 2021-09-23 NOTE — Progress Notes (Addendum)
Subjective: Overnight: Nursing staff unable to given lactulose enemas d/t difficulty, favoring ng tube for lactulose admins. 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 Discussed with Dr Havery Moros with [facility] risk vs benefits of bedside NG placement. Confirmed this is low risk and would benefit from NG placement for hepatic encephalopathy. NG tube place and lactulose per tube started.   Patient was seen at bedside during rounds today. He continues to be encephalopathic and is unable to engage in conversation. He has new neurological deficits as noted below under exam.   He is moaning occasionally and appears to have no trouble with breathing on supplemental O2.   Objective:  Vital signs in last 24 hours: Vitals:   09/23/21 0750 09/23/21 1209 09/23/21 1546 09/23/21 1648  BP: 123/78 129/77 107/65   Pulse: (!) 101 86 94 100  Resp: 18 16 18 18   Temp: 99.6 F (37.6 C) 99 F (37.2 C)  98.7 F (37.1 C)  TempSrc: Oral Axillary  Axillary  SpO2: 96% 96% 96%   Weight:      Height:       Constitutional: chronically ill appearing, in NAD HENT: Mouth dry, tongue scabby, blepharitis Eyes: conjunctiva non-erythematous, EOMI Neck: Neck veins flat  Cardiovascular: irregular rhythm and tachycardic, systolic murmur, non-edematous bilateral LE Pulmonary/Chest: normal work of breathing on 3L supplemental O2. Weak cough with congestion. LCTAB.  Abdominal: soft, non-tender to palpation, non-distended MSK: normal bulk and tone Neurological: ongoing encephalopathy, new left sided facial droop and favoring right upper extremities. He is able to move his left arm but not as frequently as right. No obvious rigidity of extremities. Stable left gaze preference with intact pupillary reflexes. Arouses to pain but not voice, which is unchanged compared to prior.  Skin: warm and dry, heels with good cap refills. Improved erythema of lower extremities with some desqumation. Rash of arms and legs improved.  Tinea of bilateral feet, chemosis bilaterally, plaques of feet.   Assessment/Plan:  Principal Problem:   MRSA bacteremia Active Problems:   DM (diabetes mellitus), type 2 ([place])   Cirrhosis of liver ([place])   Encephalopathy   Acute [id number]  Acute metabolic encephalopathy in setting of MRSA bacteremia, COVID infection and hx of cirrhosis New focal neurological deficits  Afebrile and HDS today with stable. Ongoing encephalopathy thought to be due to MRSA bacteremia (on Vanc) and possibly COVID infection, however concerning given no improvement with 4 days of tx with abx and decadron. Repeat BC with NGTD. Was concerned that this may be hepatic encephalopathy given nursing staff unable to admin enemas; NGT placed yesterday and has been receiving lactulose since last night. New focal neurological deficits this morning with right sided facial droop and favoring right extremities. 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 MRI brain ordered (currently pending) and neurology consulted. 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 CTA head and neck showing patent right superior cerebellar and basilar artery; mild to mod stenosis distal right vertebral artery at the level of PICA origin with right PICA not visualized. 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 Given overall no improvement since admission with decline in mentation and new neurological deficits, PCCM consulted; PCCM spoke with neurology, who agrees that this pt does not require ICU level care and PPCM in agreement given  no increasing O2 requirement and pt's DNR/DNI status limited intervention/options they can provide.   Extensive conversation with son proving him updates, including anticipated poor outcomes, and  son is appreciative; continue DNR/DNI.  - Neurology following, appreciate assistance; holding off on Antiplatelet and Anticoagulation at this time given concern for bacterial endocarditis/septic emboli. EEG ordered and pending.  - ID following, appreciate assistance  - PCCM signed off   - Will need TEE once stable  - Continue Vancomycin  - Continue IV Decadron 6 mg daily, day 5 - Continue Remdesivir  - D5W 150 cc/hr (see below)  - Follow up on repeat blood cultures  - Follow up on repeat urine culture   - Lactulose per NG tube - SLP consulted for swallow screen  Hypernatremia Na 151 after D5W. Spoke with neuro, who recommends maintaining Na between 145-155 and to avoid abrupt decline in Na to avoid worsening mass effect from stroke, recommending normal saline and D5 as needed if Na goes above our goal of 145-155. Will D/C D5W, start normal saline and monitor q6h BMPs.   -Discontinued D5W -Started normal saline at 75 cc/hr continuous   -Start D5 as needed if Na is above goal of 145-155 -BMP q6h for Na monitoring   T2DM A1c 6.3 on 8/20. CBGs elevated 250's today in the setting of decadron use.  - Holding metformin  - increased to moderate SSI - CBG monitoring    Chronic A. fib Rates 90-100 in the setting of infection.  - Resume home Coreg once pressures allow   - Patient is not on long-term anticoagulation due to prior history of GI bleed and liver cirrhosis.    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  -Continue Lasix and spironolactone after passing bedside swallow screen  -Continue with maintenance fluids as above    HLD -Continue pravastatin after passing bedside swallow screen    Lajean Manes, MD  Internal Medicine Resident, PGY-1 [facility]  Pager: [phone]

## 2021-09-23 NOTE — Progress Notes (Signed)
Informed front desk about need of Rectal balloon cath for Lactulose Enema. There are none on the floor.

## 2021-09-23 NOTE — Progress Notes (Signed)
°  Echocardiogram 2D Echocardiogram has been performed.  Brandon [name] 09/23/2021, 11:26 AM

## 2021-09-23 NOTE — Progress Notes (Signed)
NGT placed per MD order. Verified with air bolus auscultation and gastric content aspiration. Awaiting KUB verification.

## 2021-09-23 NOTE — Consult Note (Addendum)
I have seen and examined the patient. I have personally reviewed the clinical findings, laboratory findings, microbiological data and imaging studies. The assessment and treatment plan was discussed with the Nurse Practitioner Janene Madeira.  I agree with her/his recommendations except following additions/corrections.  76 Y O male with PMH of Colon ca s/p partial colectomy,  CADs/p CABG, DM, Liver cirrhosis with portal HTN,/Esophageal varices and Gastric AVM/Splenomegaly ( ,Acute hepatitis panel negative in 1/21)  morbid obesity with chronic indwelling catheter, distant rt femur fracture s/p ORIF who presented from SNF to the ED for AMS and hypoxia. Also recently diagnosed with COVID, unclear when. Discrepancy regarding patient's baseline functional state   ED presentation with febrile Imaging with no acute abnormality in chest xray and CT abdomen/pelvis except Liver cirrhosis with grossly stable 6 cm mass  Examination  Moan when spoken, unable to answer any questions currently On Nasal cannula 3-4 L Dehydrated with crusts noted on tongue Heart and lung sounds WNL Moans on abdominal palpation at all quadrants No pedal edema  Natale Milch appearing macular rash in the lower torso and upper thighs, seems to be resolving  ROM of left   Continue Vancomycin, pharmacy to dose Fu repeat blood cultures on 2/26 TTE. If negative needs TEE Monitor for improvement in mental status with treatment of infection, low threshold to get imaging of head On dexamethasone 6mg  daily for COVID. Not on any antivirals. He is on 3-4 L [place]. Will try to get a CT value and see if this is active/recent infection COVID 19 labs per protocol Isolation precautions per IP protocol Monitor CBC, BMP and Vancomycin trough    Rosiland Oz, MD Infectious Disease Physician [facility] [address] [place], [place] [address] Phone: [phone]   Fax:  [facility]    Date of Admission:  09/15/2021      Total days of antibiotics 4  Vancomycin 2/24 >> current  Ceftriaxone 2/25 >> current   Metronidazole x 1   Cefepime x 1                Reason for Consult: MRSA Bacteremia    Referring Provider: [employer] Primary Care Provider: Helane Rima, MD    Assessment: Brandon [name] is a 76 y.o. male resident from [facility] SNF, decompensated cirrhosis, type 2 diabetes, AFib and chronic indwelling catheter, distant RT femur fracture s/p ORIF here with acute hypoxic respiratory failure 2/2 acute ZCHYI-50 infection complicated by MRSA bacteremia and AMS.  The cause of MRSA bacteremia is not at this point clear. Does not seem to have MRSA pneumonia as he has a fairly unimpressive CXR and no productive coughing. No significant wounds noted to skin. H/O ORIF in distal femur for fx repair (2022) - no concern for any metastatic sites of infection seeding orthopedic hardware at this time. Continue to monitor. Will order TTE for him to evaluate LV function and assess possible endocarditis. Repeat Blood cultures pending. Continue Vancomycin for him IV.   Natale Milch appearing macular rash - possible viral exanthem with acute COVID-19. Doubt cephalosporin rash given it seems it was present on admission.  Have seen where staphylococcus aureus bacteremia can cause rashes as well, however these are typically more petechial/vasculitic in appearance. Seems to be improving either on it's own or possibly with steroids. Unclear.   Acute COVID-19 Respiratory Failure - afebrile and oxygenation seems improving on steroids. Inflammatory markers  down-trending. Care per primary team.   Chronic indwelling catheter - seems less likely he also has UTI also in the setting of the other acute processes. I think we can stop the gram negative coverage.   AMS - ?baseline - Seems there are few discrepancies with his  functional/mental baseline. bacteremia is being treated. ?Persistent COVID effect.    Plan: Continue vancomycin  Stop ceftriaxone  FU TTE FU repeat BCx 2/26    Principal Problem:   MRSA bacteremia Active Problems:   Acute COVID-19   DM (diabetes mellitus), type 2 (HCC)   Cirrhosis of liver (HCC)   Encephalopathy    Chlorhexidine Gluconate Cloth  6 each Topical Daily   dexamethasone (DECADRON) injection  6 mg Intravenous Q24H   enoxaparin (LOVENOX) injection  70 mg Subcutaneous Q24H   insulin aspart  0-9 Units Subcutaneous TID WC   lactulose  300 mL Rectal Daily    HPI: Brandon [name] is a 76 y.o. male admitted from SNF [facility]) for concern over worsening hypoxic respiratory failure in the setting of acute COVID-19 infection.   Unable to get history from patient - chart reviewed: Pertinent pmhx includes: cirrhosis of liver (decompensated with varices, portal HGN and ttp) T2DM, atrial fibrillation, chronic indwelling catheter.   Mr. Deneault was brought to [facility] ER on 2/24 due to concern for worsening hypoxic respiratory failure and AMS from SNF. Required 5L O2 to recover sats from the 70's%. Febrile to 102.5 in ER. Reportedly he tested positive for COVID "recently but not sure when." Concern for UTI with chronic indwelling foley + AMS vs superimposed bacterial PNA in the setting of COVID --> started on vancomycin + cefepime + metronidazole. Cool LUE - ultrasound negative for DVT.   In the interim, CXR does not note any lobar infiltrate; blood cultures drawn from Rt hand with MRSA growing in all bottles collected. 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   Macular, flat appearing rash to torso/upper thighs described - ?cephalosporin allergy though seems the rash was present on admission. Changed from cefepime to ceftriaxone d/t concern over this.   He has continued to be minimally  interactive with medical team. Grunting and groaning only. There are a few discrepancies in the patient's functional status (ambulatory? Though son reported bed bound) and baseline mental status.    Review of Systems: Review of Systems  Unable to perform ROS: Mental status change   Past Medical History:  Diagnosis Date   Anemia 07/2019   Hb 6.7 >> 3 PRBCs >> 8.8 in 07/2019.      Atrial fibrillation ([place])    Cholelithiasis    Cirrhosis of liver ([place])    Presumed due to EtOH as well as fatty liver from morbid obesity.  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  Not performed in [place] N/A 09/17/2019   Procedure: COLONOSCOPY WITH PROPOFOL;  Surgeon: [name], MD;  Location: [place];  Service: Endoscopy;  Laterality: N/A;   CORONARY ARTERY BYPASS GRAFT     in his 76s   ESOPHAGOGASTRODUODENOSCOPY (EGD) WITH PROPOFOL N/A 08/14/2019   Procedure: ESOPHAGOGASTRODUODENOSCOPY (EGD) WITH PROPOFOL;  Surgeon: [name], MD;  Location: [place];  Service: Gastroenterology;  Laterality: N/A;   ESOPHAGOGASTRODUODENOSCOPY (EGD) WITH PROPOFOL N/A 09/17/2019   Procedure: ESOPHAGOGASTRODUODENOSCOPY (EGD) WITH PROPOFOL;  Surgeon: [name], MD;  Location: [place];  Service: Endoscopy;   Laterality: N/A;   ESOPHAGOGASTRODUODENOSCOPY (EGD) WITH PROPOFOL N/A 10/04/2019   Procedure: ESOPHAGOGASTRODUODENOSCOPY (EGD) WITH PROPOFOL;  Surgeon: Mauri Pole, MD;  Location: [facility] ENDOSCOPY;  Service: Endoscopy;  Laterality: N/A;   GIVENS CAPSULE STUDY N/A 09/17/2019   Procedure: GIVENS CAPSULE STUDY;  Surgeon: [name], MD;  Location: [place];  Service: Endoscopy;  Laterality: N/A;   HOT HEMOSTASIS N/A 08/14/2019   Procedure: HOT HEMOSTASIS (ARGON PLASMA COAGULATION/BICAP);  Surgeon: [name], MD;  Location: [facility] ENDOSCOPY;  Service: Gastroenterology;  Laterality: N/A;   HOT HEMOSTASIS N/A 09/17/2019   Procedure: HOT HEMOSTASIS (ARGON PLASMA COAGULATION/BICAP);  Surgeon: [name], MD;  Location: [place];  Service: Endoscopy;  Laterality: N/A;   ORIF FEMUR FRACTURE Right 02/04/2021   Procedure: OPEN REDUCTION INTERNAL FIXATION (ORIF) DISTAL FEMUR FRACTURE;  Surgeon: Shona Needles, MD;  Location: [place];  Service: Orthopedics;  Laterality: Right;   PARTIAL COLECTOMY  2003   To address colon cancer   PATELLAR TENDON REPAIR Right 09/30/2018   Procedure: PATELLA TENDON REPAIR;  Surgeon: Altamese [place], MD;  Location: [place];  Service: Orthopedics;  Laterality: Right;   POLYPECTOMY  09/17/2019   Procedure: POLYPECTOMY;  Surgeon: [name], MD;  Location: [facility] ENDOSCOPY;  Service: Endoscopy;;     Social History   Tobacco Use   Smoking status: Former    Types: Cigarettes    Quit date: 1990    Years since quitting: 33.1   Smokeless tobacco: Never  Vaping Use   Vaping Use: Never used  Substance Use Topics   Alcohol use: Not Currently    Comment: 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 rate 18, height 6' (1.829 m), weight 122.5 kg, SpO2 96 %.  Physical Exam Constitutional:      General: He is not in acute distress.    Appearance: He is obese. He is ill-appearing. He is not toxic-appearing.     Comments: Arouses with noxious verbal/physical stimuli.   HENT:     Mouth/Throat:     Mouth: Mucous membranes are dry.     Comments: Dry peeling plaques on tongue/lips. Heavy mouth breathing.  Eyes:     General: No scleral icterus.       Right eye: No discharge.        Left eye: No discharge.     Pupils: Pupils are equal, round, and  reactive to light.  Cardiovascular:     Rate and Rhythm: Normal rate. Rhythm irregular.     Heart sounds: No murmur heard. Pulmonary:     Comments: Fine basilar crackles. Overall work of breathing appears comfortable. Sats > 91% with nasal prongs out of nose in beard.  Musculoskeletal:     Cervical back: Normal range of motion.     Comments: Limited ROM with adduction of hips. Grimaces with any attempt for movement but not crying out.   Skin:    General: Skin is dry.     Capillary Refill: Capillary refill takes less than 2 seconds.     Comments: Scaled dry skin on b/l legs and arms. Thin skin. Faint lacy macular appearing rash to thighs and flanks. Nothing open or raised. No signs of cellulitis involving extremities.   Neurological:     General: No focal deficit present.     Mental Status: He is disoriented.    Lab Results Lab Results  Component Value Date   WBC 7.3 09/23/2021   HGB 11.0 (L) 09/23/2021   HCT 36.2 (L) 09/23/2021   MCV 90.3 09/23/2021   PLT 68 (L) 09/23/2021    Lab Results  Component Value Date   CREATININE 1.34 (H) 09/23/2021   BUN 67 (H) 09/23/2021   NA 155 (H) 09/23/2021   K 4.2 09/23/2021   CL 119 (H) 09/23/2021   CO2 29 09/23/2021    Lab Results  Component Value Date   ALT 28 09/21/2021   AST 34 09/16/2021   ALKPHOS 92 09/21/2021   BILITOT 0.6 09/23/2021      Microbiology: Recent Results (from the past 240 hour(s))  Urine Culture     Status: Abnormal   Collection Time: 09/01/2021 12:21 PM   Specimen: In/Out Cath Urine  Result Value Ref Range Status   Specimen Description IN/OUT CATH URINE  Final   Special Requests   Final    NONE Performed at [facility], [address] [place] [employer] [place],  [address]    Culture MULTIPLE SPECIES PRESENT, SUGGEST RECOLLECTION (A)  Final   Report Status 09/21/2021 FINAL  Final  Blood Culture (routine x 2)     Status: Abnormal   Collection Time: 09/12/2021 12:30 PM   Specimen: BLOOD RIGHT HAND  Result Value Ref Range Status   Specimen Description BLOOD RIGHT HAND  Final   Special Requests   Final    BOTTLES DRAWN AEROBIC AND ANAEROBIC Blood Culture adequate volume   Culture  Setup Time   Final    GRAM POSITIVE COCCI IN BOTH AEROBIC AND ANAEROBIC BOTTLES CRITICAL RESULT CALLED TO, READ BACK BY AND VERIFIED WITH: PHARMD JAMES LEDFORD 09/21/21@00 :51 BY TW Performed at [facility], [place] [address] [place], [place] [address]    Culture METHICILLIN RESISTANT STAPHYLOCOCCUS AUREUS (A)  Final   Report Status 09/23/2021 FINAL  Final   Organism ID, Bacteria METHICILLIN RESISTANT STAPHYLOCOCCUS AUREUS  Final      Susceptibility   Methicillin resistant staphylococcus aureus - MIC*    CIPROFLOXACIN 1 SENSITIVE Sensitive     ERYTHROMYCIN >=8 RESISTANT Resistant     GENTAMICIN <=0.5 SENSITIVE Sensitive     OXACILLIN >=4 RESISTANT Resistant     TETRACYCLINE <=1 SENSITIVE Sensitive     VANCOMYCIN <=0.5 SENSITIVE Sensitive     TRIMETH/SULFA <=10 SENSITIVE Sensitive     CLINDAMYCIN <=0.25 SENSITIVE Sensitive     RIFAMPIN <=0.5 SENSITIVE Sensitive     Inducible Clindamycin NEGATIVE Sensitive     *  METHICILLIN RESISTANT STAPHYLOCOCCUS AUREUS  Blood Culture (routine x 2)     Status: Abnormal   Collection Time: 08/28/2021 12:30 PM   Specimen: BLOOD RIGHT HAND  Result Value Ref Range Status   Specimen  Description BLOOD RIGHT HAND  Final   Special Requests   Final    BOTTLES DRAWN AEROBIC AND ANAEROBIC Blood Culture adequate volume   Culture  Setup Time   Final    GRAM POSITIVE COCCI IN BOTH AEROBIC AND ANAEROBIC BOTTLES CRITICAL VALUE NOTED.  VALUE IS CONSISTENT WITH PREVIOUSLY REPORTED AND CALLED VALUE.    Culture (A)  Final    STAPHYLOCOCCUS AUREUS SUSCEPTIBILITIES PERFORMED ON PREVIOUS CULTURE WITHIN THE LAST 5 DAYS. Performed at [facility], [place] [address], [place], [place] [address]    Report Status 09/23/2021 FINAL  Final  Blood Culture ID Panel (Reflexed)     Status: Abnormal   Collection Time: 09/22/2021 12:30 PM  Result Value Ref Range Status   Enterococcus faecalis NOT DETECTED NOT DETECTED Final   Enterococcus Faecium NOT DETECTED NOT DETECTED Final   Listeria monocytogenes NOT DETECTED NOT DETECTED Final   Staphylococcus species DETECTED (A) NOT DETECTED Final    Comment: CRITICAL RESULT CALLED TO, READ BACK BY AND VERIFIED WITH: PHARMD JAMES LEDFORD 09/21/21@00 :51 BY TW    Staphylococcus aureus (BCID) DETECTED (A) NOT DETECTED Final    Comment: Methicillin (oxacillin)-resistant Staphylococcus aureus (MRSA). MRSA is predictably resistant to beta-lactam antibiotics (except ceftaroline). Preferred therapy is vancomycin unless clinically contraindicated. Patient requires contact precautions if  hospitalized. CRITICAL RESULT CALLED TO, READ BACK BY AND VERIFIED WITH: PHARMD JAMES LEDFORD 09/21/21@00 :51 BY TW    Staphylococcus epidermidis NOT DETECTED NOT DETECTED Final   Staphylococcus lugdunensis NOT DETECTED NOT DETECTED Final   Streptococcus species NOT DETECTED NOT DETECTED Final   Streptococcus agalactiae NOT DETECTED NOT DETECTED Final   Streptococcus pneumoniae NOT DETECTED NOT DETECTED Final   Streptococcus pyogenes NOT DETECTED NOT DETECTED Final   A.calcoaceticus-baumannii NOT DETECTED NOT DETECTED Final   Bacteroides fragilis NOT DETECTED NOT DETECTED  Final   Enterobacterales NOT DETECTED NOT DETECTED Final   Enterobacter cloacae complex NOT DETECTED NOT DETECTED Final   Escherichia coli NOT DETECTED NOT DETECTED Final   Klebsiella aerogenes NOT DETECTED NOT DETECTED Final   Klebsiella oxytoca NOT DETECTED NOT DETECTED Final   Klebsiella pneumoniae NOT DETECTED NOT DETECTED Final   Proteus species NOT DETECTED NOT DETECTED Final   Salmonella species NOT DETECTED NOT DETECTED Final   Serratia marcescens NOT DETECTED NOT DETECTED Final   Haemophilus influenzae NOT DETECTED NOT DETECTED Final   Neisseria meningitidis NOT DETECTED NOT DETECTED Final   Pseudomonas aeruginosa NOT DETECTED NOT DETECTED Final   Stenotrophomonas maltophilia NOT DETECTED NOT DETECTED Final   Candida albicans NOT DETECTED NOT DETECTED Final   Candida auris NOT DETECTED NOT DETECTED Final   Candida glabrata NOT DETECTED NOT DETECTED Final   Candida krusei NOT DETECTED NOT DETECTED Final   Candida parapsilosis NOT DETECTED NOT DETECTED Final   Candida tropicalis NOT DETECTED NOT DETECTED Final   Cryptococcus neoformans/gattii NOT DETECTED NOT DETECTED Final   Meth resistant mecA/C and MREJ DETECTED (A) NOT DETECTED Final    Comment: CRITICAL RESULT CALLED TO, READ BACK BY AND VERIFIED WITH: PHARMD JAMES LEDFORD 09/21/21@00 :51 BY TW Performed at [facility], [address] [place], [place] [address]   Resp Panel by RT-PCR (Flu A&B, Covid) Nasopharyngeal Swab     Status: Abnormal   Collection  Time: 09/09/2021 12:34 PM   Specimen: Nasopharyngeal Swab; Nasopharyngeal(NP) swabs in vial transport medium  Result Value Ref Range Status   SARS Coronavirus 2 by RT PCR POSITIVE (A) NEGATIVE Final    Comment: (NOTE) SARS-CoV-2 target nucleic acids are DETECTED.  The SARS-CoV-2 RNA is generally detectable in upper respiratory specimens during the acute phase of infection. Positive results are indicative of the presence of the identified virus, but do not rule out  bacterial infection or co-infection with other pathogens not detected by the test. Clinical correlation with patient history and other diagnostic information is necessary to determine patient infection status. The expected result is Negative.  Fact Sheet for Patients: EntrepreneurPulse.com.au  Fact Sheet for Healthcare Providers: IncredibleEmployment.be  This test is not yet approved or cleared by the [place] FDA and  has been authorized for detection and/or diagnosis of SARS-CoV-2 by FDA under an Emergency Use Authorization (EUA).  This EUA will remain in effect (meaning this test can be used) for the duration of  the COVID-19 declaration under Section 564(b)(1) of the A ct, 21 U.S.C. section 360bbb-3(b)(1), unless the authorization is terminated or revoked sooner.     Influenza A by PCR NEGATIVE NEGATIVE Final   Influenza B by PCR NEGATIVE NEGATIVE Final    Comment: (NOTE) The Xpert Xpress SARS-CoV-2/FLU/RSV plus assay is intended as an aid in the diagnosis of influenza from Nasopharyngeal swab specimens and should not be used as a sole basis for treatment. Nasal washings and aspirates are unacceptable for Xpert Xpress SARS-CoV-2/FLU/RSV testing.  Fact Sheet for Patients: EntrepreneurPulse.com.au  Fact Sheet for Healthcare Providers: IncredibleEmployment.be  This test is not yet approved or cleared by the [place] FDA and has been authorized for detection and/or diagnosis of SARS-CoV-2 by FDA under an Emergency Use Authorization (EUA). This EUA will remain in effect (meaning this test can be used) for the duration of the COVID-19 declaration under Section 564(b)(1) of the Act, 21 U.S.C. section 360bbb-3(b)(1), unless the authorization is terminated or revoked.  Performed at [facility], [place] [address], [place], [place] [address]   Urine Culture     Status: Abnormal   Collection  Time: 09/21/21  3:28 PM   Specimen: Urine, Bag (ped)  Result Value Ref Range Status   Specimen Description Ur, Bag ped  Final   Special Requests NONE  Final   Culture (A)  Final    <10,000 COLONIES/mL INSIGNIFICANT GROWTH Performed at [facility], [place] [address] [place], [place] [address]    Report Status 09/23/2021 FINAL  Final  Culture, blood (routine x 2)     Status: None (Preliminary result)   Collection Time: 09/22/21  5:54 PM   Specimen: BLOOD RIGHT HAND  Result Value Ref Range Status   Specimen Description BLOOD RIGHT HAND  Final   Special Requests   Final    BOTTLES DRAWN AEROBIC ONLY Blood Culture results may not be optimal due to an inadequate volume of blood received in culture bottles   Culture   Final    NO GROWTH < 24 HOURS Performed at [facility], [place] [address] [place], [place] [address]    Report Status PENDING  Incomplete  Culture, blood (routine x 2)     Status: None (Preliminary result)   Collection Time: 09/22/21  5:54 PM   Specimen: BLOOD  Result Value Ref Range Status   Specimen Description BLOOD RIGHT ANTECUBITAL  Final   Special Requests   Final  BOTTLES DRAWN AEROBIC ONLY Blood Culture adequate volume   Culture   Final    NO GROWTH < 24 HOURS Performed at [facility], [place] [address] [place], [place] [address]    Report Status PENDING  Incomplete    Janene Madeira, MSN, NP-C [facility] for Infectious Disease [email or web address] Pager: [phone] Office: [phone] [facility] Main Line: [place] Communication Welcome   09/23/2021 10:21 AM

## 2021-09-23 NOTE — Progress Notes (Signed)
Son Brandon [name] at cell phone number [phone] called and he has many questions regarding his father's AMS and why this is happening.  He stated that this is not his father's baseline.  He is requesting call back tonight from on call MD.  Will contact on call MD and give this message.  Time: 2013 On call MD notified and will contact son later this evening.

## 2021-09-23 NOTE — Progress Notes (Signed)
Pharmacy Antibiotic Note  Brandon [name] is a 76 y.o. male admitted on 09/10/2021 and found to have MRSA bacteremia. Pharmacy has been consulted for Vancomycin dosing.  A Vancomycin peak/trough was drawn on 2/27 & 2/28 and resulted as 39 mcg/ml and 19 mcg/ml respectively for a calculated AUC of 455 which is at goal.   Plan: - Continue Vancomycin 1250 mg IV every 24 hours - Will continue to follow renal function, culture results, and LOT plans  Height: 6' (182.9 cm) Weight: 122.5 kg (270 lb) IBW/kg (Calculated) : 77.6  Temp (24hrs), Avg:99.3 F (37.4 C), Min:99 F (37.2 C), Max:99.6 F (37.6 C)  Recent Labs  Lab 08/31/2021 1230 09/16/2021 1430 09/21/21 0102 09/22/21 0108 09/23/21 0422 09/23/21 0603 09/23/21 1207  WBC 9.0  --  7.0 6.8 7.3  --   --   CREATININE 1.47*  --  1.42* 1.37*  --  1.34* 1.27*  LATICACIDVEN 2.1* 2.2*  --   --   --   --   --     Estimated Creatinine Clearance: 69 mL/min (A) (by C-G formula based on SCr of 1.27 mg/dL (H)).    No Known Allergies  Antimicrobials this admission: Cefepime 2/24 x 1 Vancomycin 2/24 >> CTX 2/25 >> 2/26 Remdesivir 2/27 >> (3/1)  Dose adjustments this admission: N/a  Microbiology results: 2/24 COVID+ (CT 30.4), influenza neg 2/24 BCx >> MRSA 2/26 BCx >> ngtd  Thank you for allowing pharmacy to be a part of this patients care.  Alycia Rossetti, PharmD, BCPS Infectious Diseases Clinical Pharmacist 09/24/2021 3:26 PM   **Pharmacist phone directory can now be found on [email or web address] ([email or web address]).  Listed under [place].

## 2021-09-24 ENCOUNTER — Inpatient Hospital Stay (HOSPITAL_COMMUNITY): Payer: Medicare Other

## 2021-09-24 DIAGNOSIS — R7881 Bacteremia: Secondary | ICD-10-CM | POA: Diagnosis not present

## 2021-09-24 DIAGNOSIS — R4182 Altered mental status, unspecified: Secondary | ICD-10-CM | POA: Diagnosis not present

## 2021-09-24 DIAGNOSIS — B9562 Methicillin resistant Staphylococcus aureus infection as the cause of diseases classified elsewhere: Secondary | ICD-10-CM | POA: Diagnosis not present

## 2021-09-24 DIAGNOSIS — I639 Cerebral infarction, unspecified: Secondary | ICD-10-CM | POA: Diagnosis not present

## 2021-09-24 DIAGNOSIS — U071 COVID-19: Secondary | ICD-10-CM | POA: Diagnosis not present

## 2021-09-24 LAB — CBC
HCT: 38.1 % — ABNORMAL LOW (ref 39.0–52.0)
Hemoglobin: 11.1 g/dL — ABNORMAL LOW (ref 13.0–17.0)
MCH: 26.5 pg (ref 26.0–34.0)
MCHC: 29.1 g/dL — ABNORMAL LOW (ref 30.0–36.0)
MCV: 90.9 fL (ref 80.0–100.0)
Platelets: 83 10*3/uL — ABNORMAL LOW (ref 150–400)
RBC: 4.19 MIL/uL — ABNORMAL LOW (ref 4.22–5.81)
RDW: 15.4 % (ref 11.5–15.5)
WBC: 9.7 10*3/uL (ref 4.0–10.5)
nRBC: 0.2 % (ref 0.0–0.2)

## 2021-09-24 LAB — GLUCOSE, CAPILLARY
Glucose-Capillary: 244 mg/dL — ABNORMAL HIGH (ref 70–99)
Glucose-Capillary: 248 mg/dL — ABNORMAL HIGH (ref 70–99)
Glucose-Capillary: 236 mg/dL — ABNORMAL HIGH (ref 70–99)
Glucose-Capillary: 202 mg/dL — ABNORMAL HIGH (ref 70–99)
Glucose-Capillary: 172 mg/dL — ABNORMAL HIGH (ref 70–99)
Glucose-Capillary: 156 mg/dL — ABNORMAL HIGH (ref 70–99)

## 2021-09-24 LAB — BASIC METABOLIC PANEL
Anion gap: 7 (ref 5–15)
Anion gap: 7 (ref 5–15)
Anion gap: 6 (ref 5–15)
Anion gap: 5 (ref 5–15)
BUN: 61 mg/dL — ABNORMAL HIGH (ref 8–23)
BUN: 57 mg/dL — ABNORMAL HIGH (ref 8–23)
BUN: 56 mg/dL — ABNORMAL HIGH (ref 8–23)
BUN: 47 mg/dL — ABNORMAL HIGH (ref 8–23)
CO2: 27 mmol/L (ref 22–32)
CO2: 26 mmol/L (ref 22–32)
CO2: 27 mmol/L (ref 22–32)
CO2: 25 mmol/L (ref 22–32)
Calcium: 10.1 mg/dL (ref 8.9–10.3)
Calcium: 10.3 mg/dL (ref 8.9–10.3)
Calcium: 10.1 mg/dL (ref 8.9–10.3)
Calcium: 9.9 mg/dL (ref 8.9–10.3)
Chloride: 119 mmol/L — ABNORMAL HIGH (ref 98–111)
Chloride: 118 mmol/L — ABNORMAL HIGH (ref 98–111)
Chloride: 118 mmol/L — ABNORMAL HIGH (ref 98–111)
Chloride: 120 mmol/L — ABNORMAL HIGH (ref 98–111)
Creatinine, Ser: 1.13 mg/dL (ref 0.61–1.24)
Creatinine, Ser: 1.17 mg/dL (ref 0.61–1.24)
Creatinine, Ser: 1.21 mg/dL (ref 0.61–1.24)
Creatinine, Ser: 1.09 mg/dL (ref 0.61–1.24)
GFR, Estimated: >60 mL/min (ref >60)
GFR, Estimated: >60 mL/min (ref >60)
GFR, Estimated: >60 mL/min (ref >60)
GFR, Estimated: >60 mL/min (ref >60)
Glucose, Bld: 249 mg/dL — ABNORMAL HIGH (ref 70–99)
Glucose, Bld: 287 mg/dL — ABNORMAL HIGH (ref 70–99)
Glucose, Bld: 256 mg/dL — ABNORMAL HIGH (ref 70–99)
Glucose, Bld: 171 mg/dL — ABNORMAL HIGH (ref 70–99)
Potassium: 4.2 mmol/L (ref 3.5–5.1)
Potassium: 4.2 mmol/L (ref 3.5–5.1)
Potassium: 4.2 mmol/L (ref 3.5–5.1)
Potassium: 3.9 mmol/L (ref 3.5–5.1)
Sodium: 153 mmol/L — ABNORMAL HIGH (ref 135–145)
Sodium: 151 mmol/L — ABNORMAL HIGH (ref 135–145)
Sodium: 151 mmol/L — ABNORMAL HIGH (ref 135–145)
Sodium: 150 mmol/L — ABNORMAL HIGH (ref 135–145)

## 2021-09-24 LAB — HEPATIC FUNCTION PANEL
ALT: 21 U/L (ref 0–44)
AST: 20 U/L (ref 15–41)
Albumin: 2.2 g/dL — ABNORMAL LOW (ref 3.5–5.0)
Alkaline Phosphatase: 60 U/L (ref 38–126)
Bilirubin, Direct: 0.1 mg/dL (ref 0.0–0.2)
Indirect Bilirubin: 0.3 mg/dL (ref 0.3–0.9)
Total Bilirubin: 0.4 mg/dL (ref 0.3–1.2)
Total Protein: 5.7 g/dL — ABNORMAL LOW (ref 6.5–8.1)

## 2021-09-24 LAB — LIPID PANEL
Cholesterol: 96 mg/dL (ref 0–200)
HDL: 20 mg/dL — ABNORMAL LOW (ref >40)
LDL Cholesterol: 36 mg/dL (ref 0–99)
Total CHOL/HDL Ratio: 4.8 RATIO
Triglycerides: 200 mg/dL — ABNORMAL HIGH (ref <150)
VLDL: 40 mg/dL (ref 0–40)

## 2021-09-24 LAB — VANCOMYCIN, TROUGH: Vancomycin Tr: 19 ug/mL (ref 15–20)

## 2021-09-24 IMAGING — DX DG ABDOMEN 1V
1 series · 1 of 1 positions shown · non-contrast
Comparison: [DATE].

CLINICAL DATA: NG tube placement.

EXAM:
ABDOMEN - 1 VIEW

[abdomen supine]
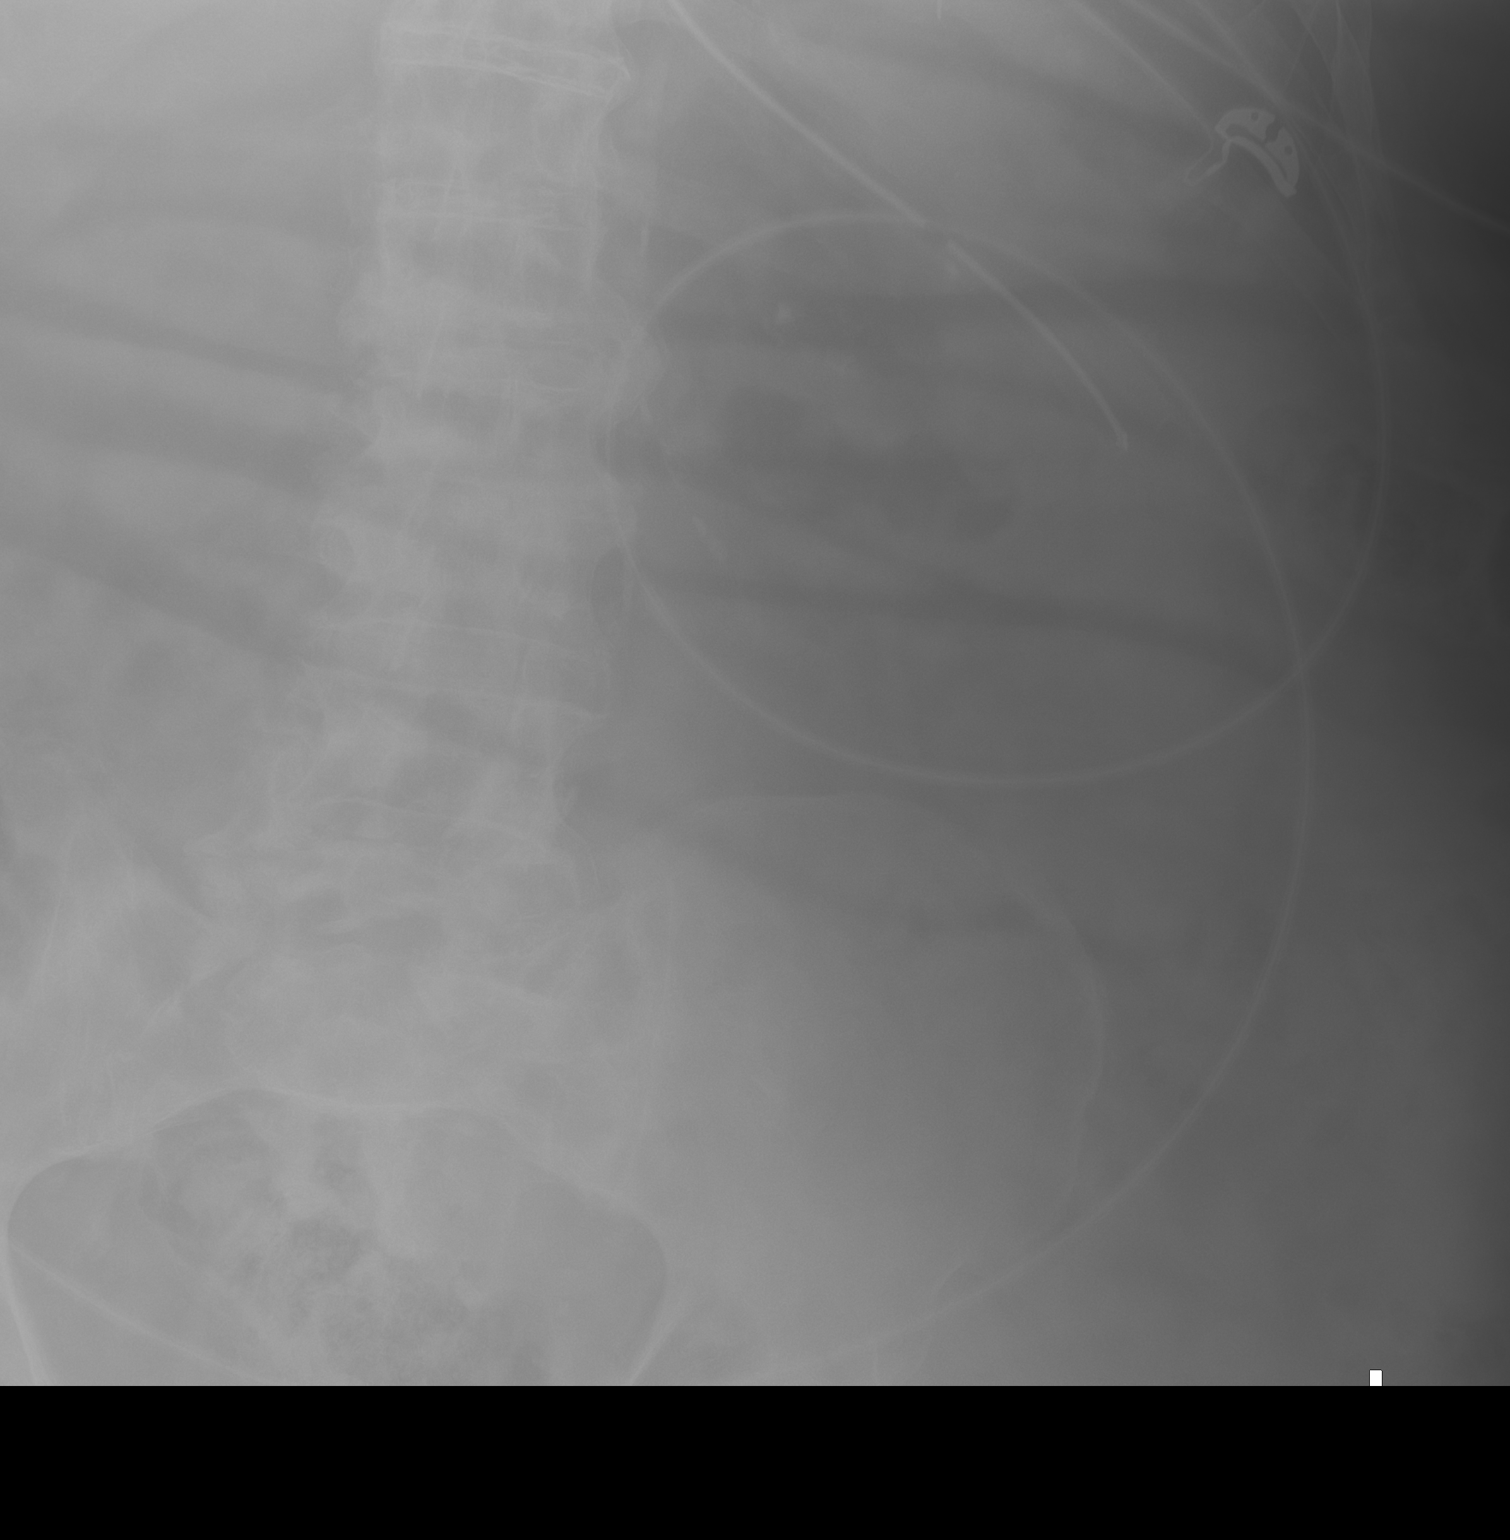

[1 of 1 positions shown; findings below may reference images not displayed]

FINDINGS: The bowel gas pattern is normal. An enteric tube terminates over the
left upper quadrant in the anticipated region of the stomach. No
radio-opaque calculi. There is atherosclerotic calcification of the
abdominal aorta. Degenerative changes are present in the
thoracolumbar spine.
IMPRESSION: An enteric tube terminates in the left upper quadrant in the
anticipated region of the stomach.

## 2021-09-24 IMAGING — CT CT HEAD W/O CM
3 series · 15 of 47 positions shown, 18 images · non-contrast
Comparison: CT head [DATE]

CLINICAL DATA: Altered mental status



[Series 3: head 5.0 h30s · axial · 0.46mm/px · z∈[-200,-65]mm · 9 of 33 slices shown, 12 images]
[im 3/33  brain]
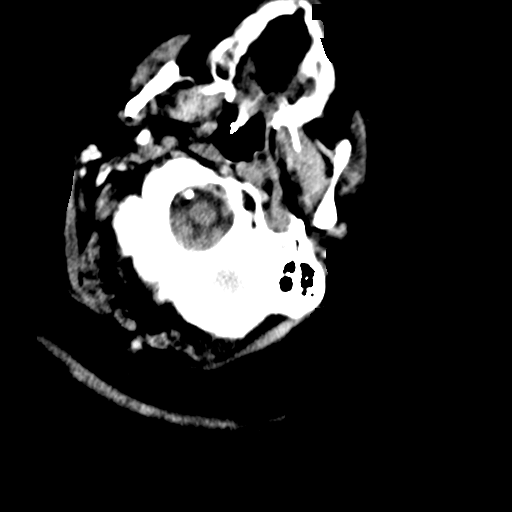
[im 3/33  bone]
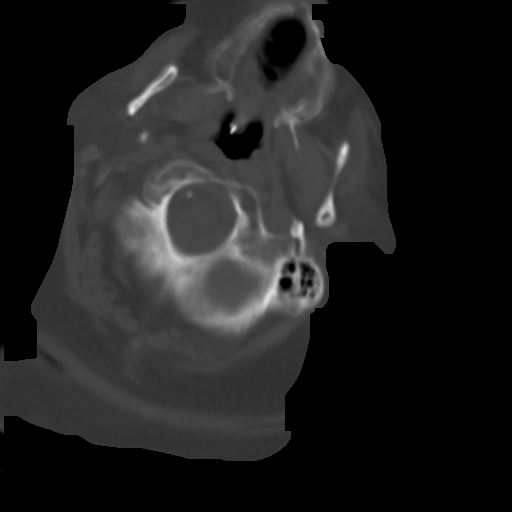
[im 6/33  brain]
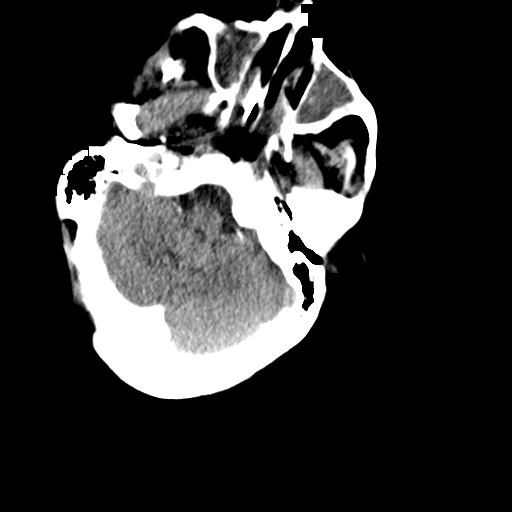
[im 9/33  brain]
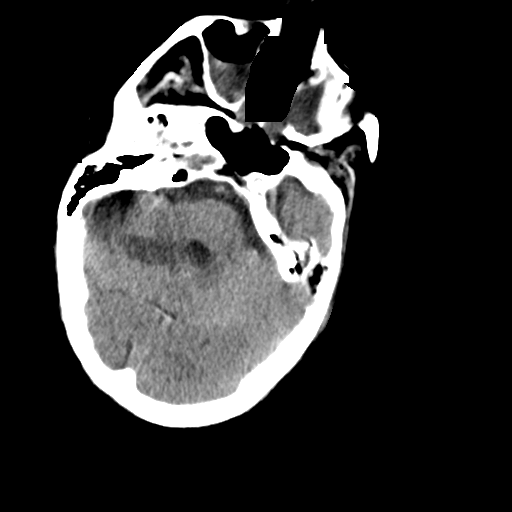
[im 13/33  brain]
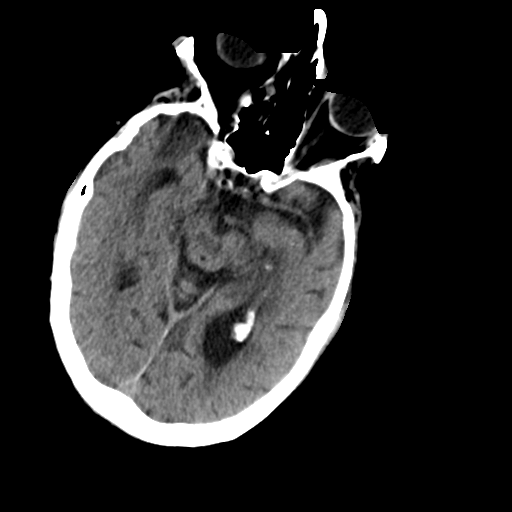
[im 17/33  brain]
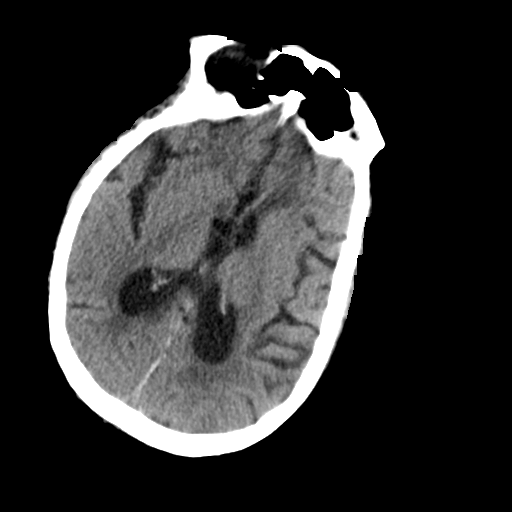
[im 17/33  bone]
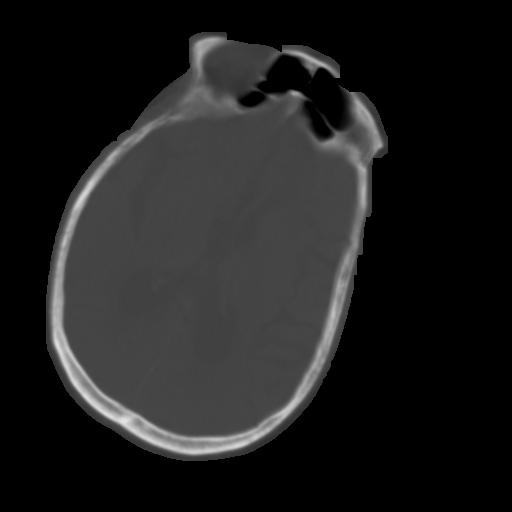
[im 20/33  brain]
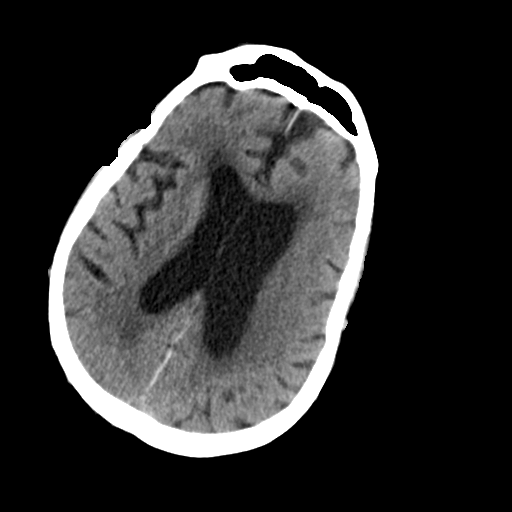
[im 24/33  brain]
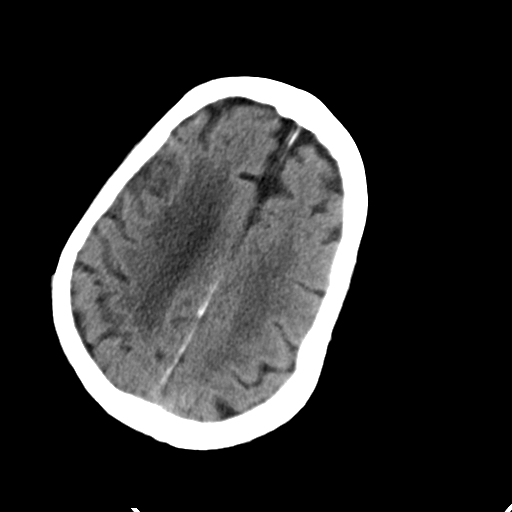
[im 27/33  brain]
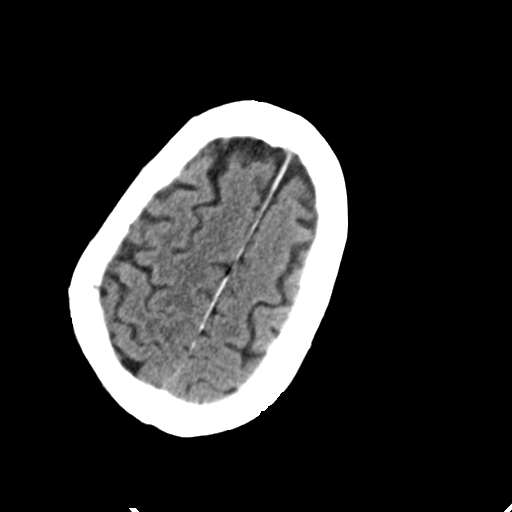
[im 30/33  brain]
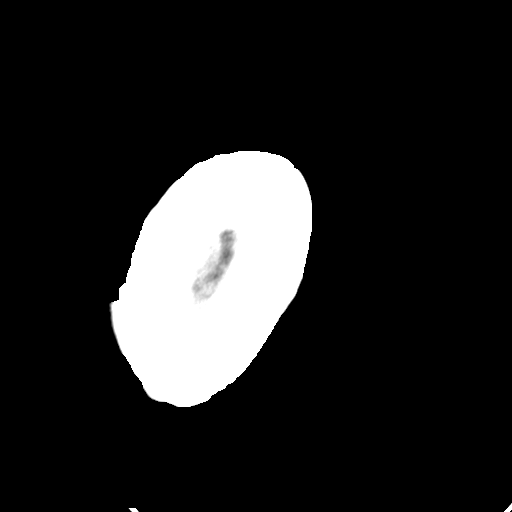
[im 30/33  bone]
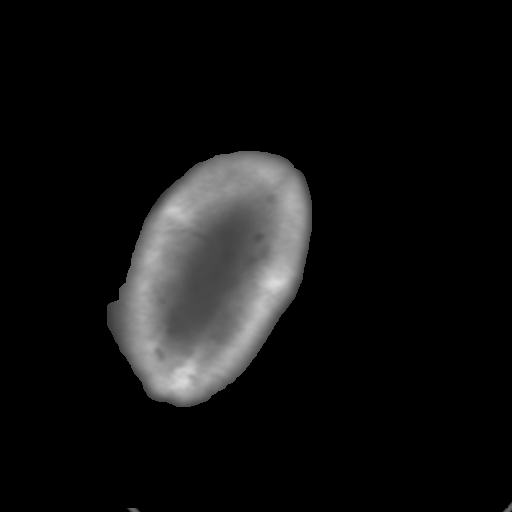

[Series 5: head 3.0 mpr cor · coronal · 0.32mm/px · 3 of 67 slices shown]
[im 23/67  brain]
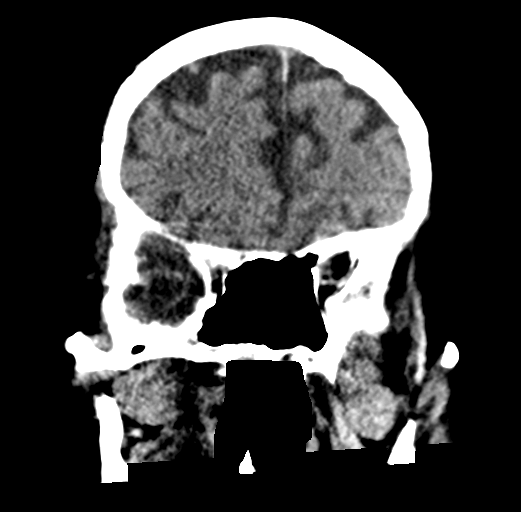
[im 30/67  brain]
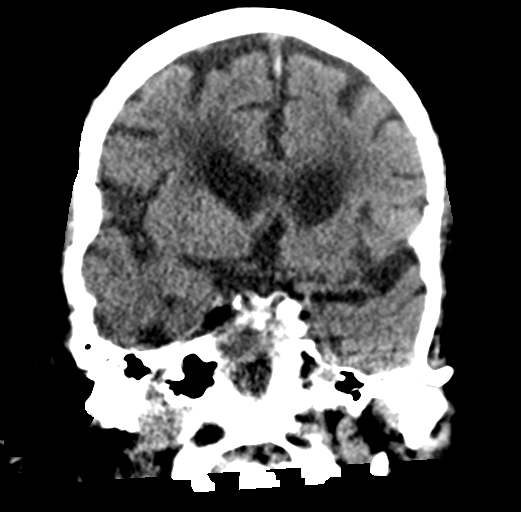
[im 37/67  brain]
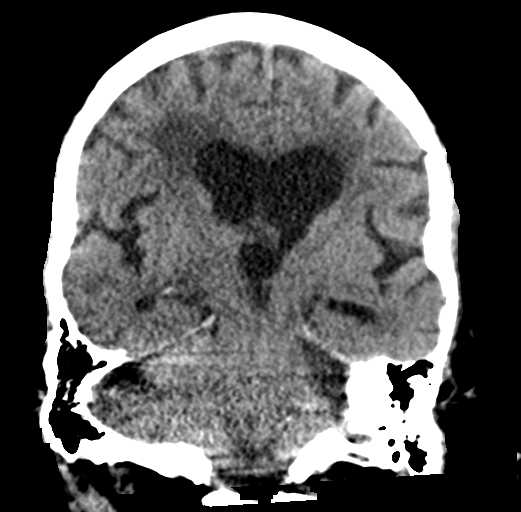

[Series 6: head 3.0 mpr sag · sagittal · 0.36mm/px · 3 of 57 slices shown]
[im 24/57  brain]
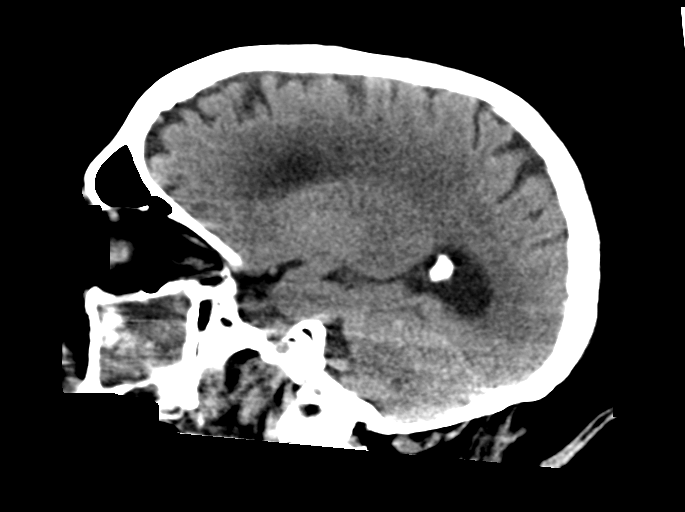
[im 29/57  brain]
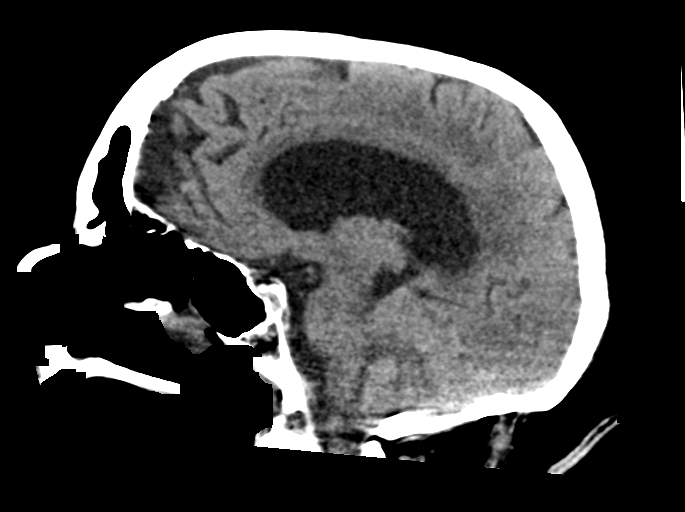
[im 33/57  brain]
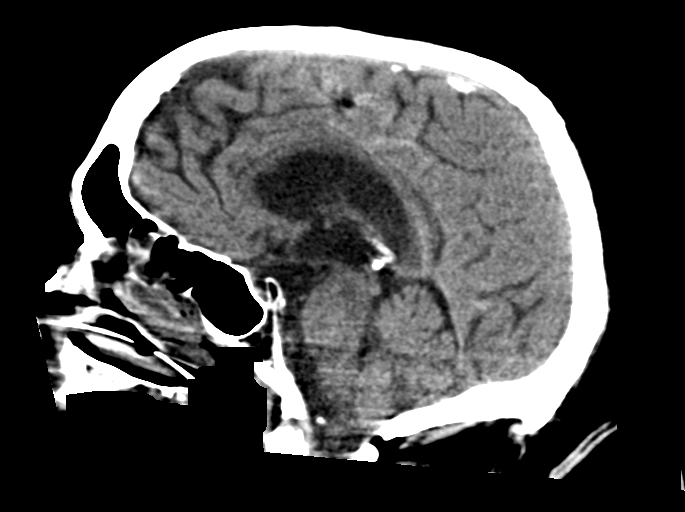

[15 of 47 positions shown; findings below may reference images not displayed]

FINDINGS: Brain: There is hypodensity in the right cerebellar hemisphere in
the SCA distribution consistent with acute to subacute infarct.
There is no associated hemorrhage or mass effect. The fourth
ventricle remains patent.

There is no other evidence of acute infarct. There is no acute
intracranial hemorrhage or extra-axial fluid collection. There is
background global parenchymal volume loss with prominence of the
ventricular system and extra-axial CSF spaces. Confluent hypodensity
in the subcortical and periventricular white matter likely reflects
sequela of chronic white matter microangiopathy.

There is no mass lesion.  There is no midline shift.

Vascular: There is calcification of the bilateral cavernous ICAs.

Skull: Normal. Negative for fracture or focal lesion.

Sinuses/Orbits: There is opacification of the maxillary sinuses.
Bilateral lens implants are in place. The globes and orbits are
otherwise unremarkable.

Other: None.
IMPRESSION: Acute to subacute infarct in the right cerebellar hemisphere in the
SCA distribution without associated hemorrhage or mass effect.

These results were called by telephone at the time of interpretation
acknowledged these results.

## 2021-09-24 IMAGING — DX DG ABD PORTABLE 1V
1 series · 1 of 1 positions shown · non-contrast
Comparison: Portable exam [VM] hours compared to [DATE] at [VM]
hours

CLINICAL DATA: Nasogastric tube placement

EXAM:
PORTABLE ABDOMEN - 1 VIEW

[abdomen kub]
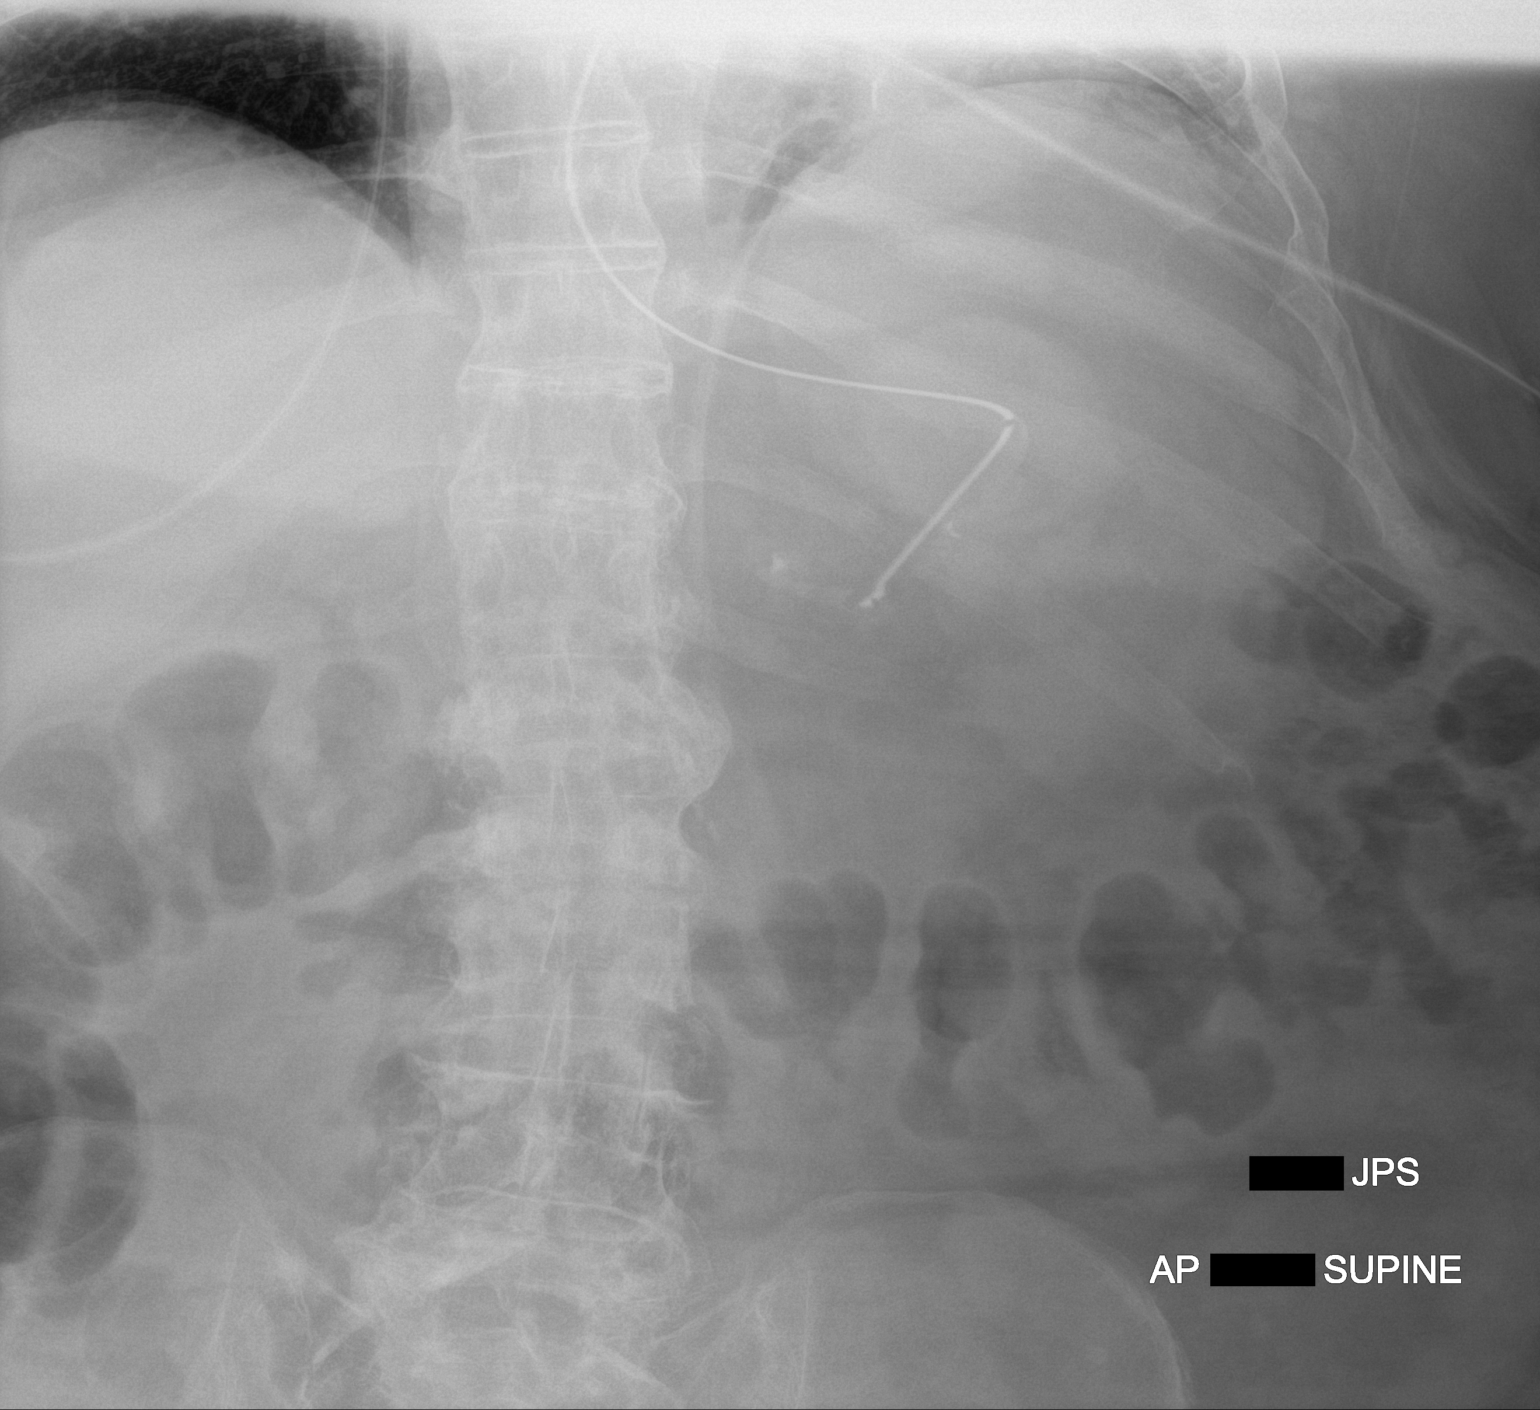

[1 of 1 positions shown; findings below may reference images not displayed]

FINDINGS: Tip of nasogastric tube projects over proximal to mid stomach.

Nonobstructive bowel gas pattern.

Multilevel degenerative changes thoracolumbar spine.
IMPRESSION: Tip of nasogastric tube projects over proximal to mid stomach.

## 2021-09-24 MED ORDER — IOHEXOL 350 MG/ML SOLN
75.0000 mL | Freq: Once | INTRAVENOUS | Status: AC | PRN
  Administered 2021-09-24: 75 mL via INTRAVENOUS

## 2021-09-24 MED ORDER — SODIUM CHLORIDE 0.9 % IV SOLN: INTRAVENOUS | Status: DC

## 2021-09-24 MED ORDER — CHLORHEXIDINE GLUCONATE 0.12 % MT SOLN
15.0000 mL | Freq: Two times a day (BID) | OROMUCOSAL | Status: DC
  Administered 2021-09-24 – 2021-09-25 (×3): 15 mL via OROMUCOSAL
  Filled 2021-09-24 (×3): qty 15

## 2021-09-24 MED ORDER — ORAL CARE MOUTH RINSE
15.0000 mL | Freq: Two times a day (BID) | OROMUCOSAL | Status: DC
  Administered 2021-09-24 – 2021-09-25 (×3): 15 mL via OROMUCOSAL

## 2021-09-24 MED ORDER — INSULIN ASPART 100 UNIT/ML IJ SOLN
0.0000 [IU] | INTRAMUSCULAR | Status: DC
  Administered 2021-09-24: 3 [IU] via SUBCUTANEOUS
  Administered 2021-09-24 (×3): 5 [IU] via SUBCUTANEOUS
  Administered 2021-09-25: 3 [IU] via SUBCUTANEOUS
  Administered 2021-09-25: 2 [IU] via SUBCUTANEOUS
  Administered 2021-09-25: 3 [IU] via SUBCUTANEOUS

## 2021-09-24 MED ORDER — DEXTROSE 5 % IV SOLN: INTRAVENOUS | Status: DC

## 2021-09-24 NOTE — Care Management Important Message (Signed)
Important Message  Patient Details  Name: Brandon [name] MRN: [id number] Date of Birth: June 06, 1946   Medicare Important Message Given:  Yes     Hannah Beat 09/24/2021, 12:50 PM

## 2021-09-24 NOTE — Consult Note (Signed)
NAME:  Brandon [name], MRN:  [id number], DOB:  23-Feb-1946, LOS: 3 ADMISSION DATE:  09/03/2021, CONSULTATION DATE:  09/24/21 REFERRING MD:  Philipp Ovens, CHIEF COMPLAINT:  AMS    History of present illness   76 year old chronically ill male with pertinent history of cirrhosis, colon cancer s/p colectomy, chronic afib admitted 09/02/2021 with acute hypoxic respiratory failure due to COVID 19 and was subsequently found to have MRSA bacteremia. His hospitalization was complicated by sudden onset  facial droop and right sided weakness. Head imaging revealed a new right cerebellar infarct. There was initial concern for basilar artery occlusion however this was ruled out on CTA, with no occlusion to the basilar artery or right superior cerebellar artery. PCCM consulted to consider transfer to ICU.   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   Objective   Blood pressure 101/70, pulse 98, temperature 98.3 F (36.8 C), temperature source Axillary, resp. rate (!) 22, height 6' (1.829 m), weight 122.5 kg, SpO2 96 %.        Intake/Output Summary (Last 24 hours) at 09/24/2021 1706 Last data filed at 09/24/2021 1616 Gross per 24 hour  Intake 4691.33 ml  Output --  Net 4691.33 ml   Filed Weights   09/23/2021 1213  Weight: 122.5 kg    Examination:  General:  obese male, eyes open but not interacting with me, leftward gaze HENT: NCAT OP clear, NG in place PULM: CTA B, normal effort CV: RRR, no mgr GI: BS+, soft, nontender MSK: normal bulk and tone Neuro: eyes open, moans to touch, doesn't speak, withdraws to pain R arm,  extensor response both feet (to pain), no response to pain in left arm   2/28 CT head > acute to subacute infarct R cerebellar hemisphere in SCA distrubution wihtout hemorrhage  Assessment & Plan:   Acute metabolic encephalopathy secondary to MRSA bacteremia, COVID 19 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  Brandon [name] has worsening encephalopathy in the setting of an acute cerebellar stroke which is complicating his hospital course after he was initially diagnosed and treated for bacteremia and COVID 19.  At baseline he has many chronic illnesses which has required assistance in [place] term care.   I do not anticipate that Brandon [name] will do well. There is no intervention we can offer in an intensive care unit which will alter the natural history of his acute stroke or the other illness from which he currently suffers.   Recommend discussing goals of care with his family and pursuing comfort measures as the likelihood of surviving this illness is low.  If he does survive the likelihood of him recovering to any meaningful functional status is also very low.  PCCM will sign off.  Please call if questions, see signature below with contact information.    Best practice:   Per primary service  Labs   CBC: Recent Labs  Lab 09/19/2021 1230 09/14/2021 2012 09/21/21 0102 09/22/21 0108 09/23/21 0422 09/24/21 0557  WBC 9.0  --  7.0 6.8 7.3 9.7  NEUTROABS 8.2*  --   --   --   --   --  HGB 14.9 13.9 11.9* 11.5* 11.0* 11.1*  HCT 48.1 41.0 38.8* 37.9* 36.2* 38.1*  MCV 88.4  --  89.4 89.6 90.3 90.9  PLT PLATELET CLUMPS NOTED ON SMEAR, UNABLE TO ESTIMATE  --  53* 66* 68* 83*    Basic Metabolic Panel: Recent Labs  Lab 09/23/21 1207 09/23/21 1551 09/23/21 2309 09/24/21 0557 09/24/21 1215  NA 155* 156* 153* 151* 151*  K 4.5 4.4 4.2  4.2 4.2  CL 121* 118* 119* 118* 118*  CO2 29 28 27 26 27   GLUCOSE 222* 198* 249* 287* 256*  BUN 67* 66* 61* 57* 56*  CREATININE 1.27* 1.20 1.13 1.17 1.21  CALCIUM 10.3 10.0 10.1 10.3 10.1   GFR: Estimated Creatinine Clearance: 72.4 mL/min (by C-G formula based on SCr of 1.21 mg/dL). Recent Labs  Lab 09/22/2021 1230 08/29/2021 1430 09/21/2021 1942 09/21/21 0102 09/22/21 0108 09/23/21 0422 09/24/21 0557  PROCALCITON  --   --  1.21  --   --   --   --   WBC 9.0  --   --  7.0 6.8 7.3 9.7  LATICACIDVEN 2.1* 2.2*  --   --   --   --   --     Liver Function Tests: Recent Labs  Lab 09/14/2021 1230 09/24/21 0557  AST 34 20  ALT 28 21  ALKPHOS 92 60  BILITOT 0.6 0.4  PROT 7.0 5.7*  ALBUMIN 2.7* 2.2*   No results for input(s): LIPASE, AMYLASE in the last 168 hours. Recent Labs  Lab 09/18/2021 1230  AMMONIA 32    ABG    Component Value Date/Time   PHART 7.41 09/23/2021 1138   PCO2ART 48 09/23/2021 1138   PO2ART 106 09/23/2021 1138   HCO3 30.5 (H) 09/23/2021 1138   TCO2 31 09/03/2021 2012   O2SAT 98.9 09/23/2021 1138     Coagulation Profile: Recent Labs  Lab 09/23/2021 1230  INR 1.2    Cardiac Enzymes: No results for input(s): CKTOTAL, CKMB, CKMBINDEX, TROPONINI in the last 168 hours.  HbA1C: Hgb A1c MFr Bld  Date/Time Value Ref Range Status  09/21/2021 10:57 AM 6.8 (H) 4.8 - 5.6 % Final    Comment:    (NOTE) Pre diabetes:          5.7%-6.4%  Diabetes:              >6.4%  Glycemic control for   <7.0% adults with diabetes   03/16/2021 03:00 AM 6.3 (H) 4.8 - 5.6 % Final    Comment:    (NOTE) Pre diabetes:          5.7%-6.4%  Diabetes:              >6.4%  Glycemic control for   <7.0% adults with diabetes     CBG: Recent Labs  Lab 09/23/21 2355 09/24/21 0508 09/24/21 0909 09/24/21 1208 09/24/21 1552  GLUCAP 223* 244* 248* 236* 202*    Review of Systems:    Cannot obtain due to encephalopathy   Past Medical History  He,  has a past medical  history of Anemia (07/2019), Atrial fibrillation ([place]), Cholelithiasis, Cirrhosis of liver ([place]), Colon cancer ([place]) (1999), Coronary artery disease, Diabetes mellitus without complication ([place]), Diverticulosis, Esophageal varices ([place]), Gastric AVM, 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   Surgical History    Past Surgical History:  Procedure Laterality Date   APPLICATION OF WOUND VAC Right 09/30/2018   Procedure:  Application Of Wound Vac;  Surgeon: Altamese Garland, MD;  Location: [facility];  Service: Orthopedics;  Laterality: Right;   COLONOSCOPY  2010   Removed 2 polyps of unclear type.  Not performed in [place] N/A 09/17/2019   Procedure: COLONOSCOPY WITH PROPOFOL;  Surgeon: [name], MD;  Location: [facility];  Service: Endoscopy;  Laterality: N/A;   CORONARY ARTERY BYPASS GRAFT     in his 41s   ESOPHAGOGASTRODUODENOSCOPY (EGD) WITH PROPOFOL N/A 08/14/2019   Procedure: ESOPHAGOGASTRODUODENOSCOPY (EGD) WITH PROPOFOL;  Surgeon: [name], MD;  Location: [facility];  Service: Gastroenterology;  Laterality: N/A;   ESOPHAGOGASTRODUODENOSCOPY (EGD) WITH PROPOFOL N/A 09/17/2019   Procedure: ESOPHAGOGASTRODUODENOSCOPY (EGD) WITH PROPOFOL;  Surgeon: [name], MD;  Location: [facility];  Service: Endoscopy;  Laterality: N/A;   ESOPHAGOGASTRODUODENOSCOPY (EGD) WITH PROPOFOL N/A 10/04/2019   Procedure: ESOPHAGOGASTRODUODENOSCOPY (EGD) WITH PROPOFOL;  Surgeon: Mauri Pole, MD;  Location: [facility] ENDOSCOPY;  Service: Endoscopy;  Laterality: N/A;   GIVENS CAPSULE STUDY N/A 09/17/2019   Procedure: GIVENS CAPSULE STUDY;  Surgeon: [name], MD;  Location: [facility];  Service: Endoscopy;  Laterality: N/A;   HOT HEMOSTASIS N/A 08/14/2019   Procedure: HOT HEMOSTASIS (ARGON PLASMA COAGULATION/BICAP);  Surgeon: [name], MD;  [facility] ENDOSCOPY;   Service: Gastroenterology;  Laterality: N/A;   HOT HEMOSTASIS N/A 09/17/2019   Procedure: HOT HEMOSTASIS (ARGON PLASMA COAGULATION/BICAP);  Surgeon: [name], MD;  Location: [facility];  Service: Endoscopy;  Laterality: N/A;   ORIF FEMUR FRACTURE Right 02/04/2021   Procedure: OPEN REDUCTION INTERNAL FIXATION (ORIF) DISTAL FEMUR FRACTURE;  Surgeon: Shona Needles, MD;  Location: [place] [facility];  Service: Orthopedics;  Laterality: Right;   PARTIAL COLECTOMY  2003   To address colon cancer   PATELLAR TENDON REPAIR Right 09/30/2018   Procedure: PATELLA TENDON REPAIR;  Surgeon: Altamese [place], MD;  Location: [place];  Service: Orthopedics;  Laterality: Right;   POLYPECTOMY  09/17/2019   Procedure: POLYPECTOMY;  Surgeon: [name], MD;  Location: [facility] ENDOSCOPY;  Service: Endoscopy;;     Social History   reports that he quit smoking about 33 years ago. His smoking use included cigarettes. He has never used smokeless tobacco. He reports that he does not currently use alcohol. He reports that he does not use drugs.   Family History   His family history includes CAD in an other family member; Colon cancer in his father, paternal uncle, and paternal uncle. There is no history of Diabetes.   Allergies No Known Allergies   Home Medications  Prior to Admission medications   Medication Sig Start Date End Date Taking? Authorizing Provider  acetaminophen (TYLENOL) 500 MG tablet Take 1,000-1,500 mg by mouth every 6 (six) hours as needed for moderate pain.   Yes [provider]  Amino Acids-Protein Hydrolys (FEEDING SUPPLEMENT, PRO-STAT SUGAR FREE 64,) LIQD Take 30 mLs by mouth 3 (three) times daily with meals.   Yes [provider]  carvedilol (COREG) 25 MG tablet Take 25 mg by mouth 2 (two) times daily with a meal.   Yes [provider]  enoxaparin (LOVENOX) 30 MG/0.3ML injection Inject 30 mg into the skin daily. This is taken in addition to the 40mg  dose, for a total of 70mg  per day    Yes [provider]  enoxaparin (LOVENOX) 40 MG/0.4ML injection Inject 40 mg into the skin daily. This is taken in addition to the 30mg  dose, for a total of 70mg  per day  Yes [provider]  finasteride (PROSCAR) 5 MG tablet Take 1 tablet (5 mg total) by mouth daily. 06/05/20  Yes Dagar, Meredith Staggers, MD  furosemide (LASIX) 20 MG tablet Take 20 mg by mouth 2 (two) times daily. 12/26/20  Yes [provider]  gabapentin (NEURONTIN) 300 MG capsule Take 1 capsule (300 mg total) by mouth 3 (three) times daily. 03/19/21  Yes Bonnielee Haff, MD  hydrOXYzine (VISTARIL) 25 MG capsule Take 25 mg by mouth 3 (three) times daily as needed for itching.   Yes [provider]  lactulose (CHRONULAC) 10 GM/15ML solution Take 30 mLs (20 g total) by mouth daily. 03/20/21  Yes Bonnielee Haff, MD  metFORMIN (GLUCOPHAGE-XR) 500 MG 24 hr tablet Take 500 mg by mouth daily with breakfast.  02/18/18  Yes [provider]  ondansetron (ZOFRAN) 4 MG tablet Take 1 tablet (4 mg total) by mouth every 6 (six) hours as needed for nausea. 02/06/21  Yes Corinne Ports, PA-C  pantoprazole (PROTONIX) 40 MG tablet Take 1 tablet (40 mg total) by mouth daily. 11/08/19 09/13/2021 Yes Esterwood, Amy S, PA-C  rosuvastatin (CRESTOR) 5 MG tablet Take 5 mg by mouth daily.   Yes [provider]  sertraline (ZOLOFT) 25 MG tablet Take 1 tablet (25 mg total) by mouth daily. 03/24/21  Yes Dessa Phi, DO  spironolactone (ALDACTONE) 50 MG tablet Take 50 mg by mouth daily.   Yes [provider]  tamsulosin (FLOMAX) 0.4 MG CAPS capsule Take 2 capsules (0.8 mg total) by mouth at bedtime. 03/23/21  Yes Dessa Phi, DO  triamcinolone cream (KENALOG) 0.1 % Apply 1 application topically 3 (three) times daily. 07/10/21  Yes [provider]  vitamin C (ASCORBIC ACID) 500 MG tablet Take 500 mg by mouth 2 (two) times daily.   Yes [provider]  zinc sulfate 220 (50 Zn) MG capsule Take  220 mg by mouth daily.   Yes [provider]  pravastatin (PRAVACHOL) 10 MG tablet Take 10 mg by mouth at bedtime. Patient not taking: Reported on 09/11/2021 02/18/18   [provider]     Roselie Awkward, MD [place] PCCM Pager: [phone] Cell: [phone] After 7:00 pm call [place]  [phone]

## 2021-09-24 NOTE — Progress Notes (Signed)
Inpatient Diabetes Program Recommendations  AACE/ADA: New Consensus Statement on Inpatient Glycemic Control (2015)  Target Ranges:  Prepandial:   less than 140 mg/dL      Peak postprandial:   less than 180 mg/dL (1-2 hours)      Critically ill patients:  140 - 180 mg/dL   Lab Results  Component Value Date   GLUCAP 248 (H) 09/24/2021   HGBA1C 6.8 (H) 09/21/2021    Review of Glycemic Control  Latest Reference Range & Units 09/23/21 08:00 09/23/21 11:45 09/23/21 16:34 09/23/21 19:37 09/23/21 23:55 09/24/21 05:08 09/24/21 09:09  Glucose-Capillary 70 - 99 mg/dL 191 (H) 186 (H) 167 (H) 213 (H) 223 (H) 244 (H) 248 (H)  (H): Data is abnormally high  Diabetes history: DM2 Outpatient Diabetes medications: Metformin 500 mg QD Current orders for Inpatient glycemic control: Novolog 0-15 units TID (increased from sensitive this morning), Decadron 6 mg QD  Inpatient Diabetes Program Recommendations:    Semglee 12 units QD (0.1 units/kg)  Will continue to follow while inpatient.  Thank you, Reche Dixon, MSN, RN Diabetes Coordinator Inpatient Diabetes Program [phone] (team pager from 8a-5p)

## 2021-09-24 NOTE — Progress Notes (Signed)
°   09/24/21 0118  Assess: MEWS Score  Temp 99.1 F (37.3 C)  BP 127/80  Resp 18  Level of Consciousness Responds to Pain  O2 Device Nasal Cannula  O2 Flow Rate (L/min) 1 L/min  Assess: MEWS Score  MEWS Temp 0  MEWS Systolic 0  MEWS Pulse 0  MEWS RR 0  MEWS LOC 2  MEWS Score 2  MEWS Score Color Yellow  Assess: if the MEWS score is Yellow or Red  Were vital signs taken at a resting state? Yes  Focused Assessment No change from prior assessment  Early Detection of Sepsis Score *See Row Information* Low  MEWS guidelines implemented *See Row Information* No, other (Comment)  Treat  Pain Scale PAINAD  Pain Score Asleep  Faces Pain Scale 0  Pain Intervention(s) Repositioned  Breathing 0  Negative Vocalization 0  Facial Expression 0  Body Language 0  Consolability 0  PAINAD Score 0  Take Vital Signs  Increase Vital Sign Frequency  Yellow: Q 2hr X 2 then Q 4hr X 2, if remains yellow, continue Q 4hrs  Escalate  MEWS: Escalate Yellow: discuss with charge nurse/RN and consider discussing with provider and RRT  Notify: Charge Nurse/RN  Name of Charge Nurse/RN Notified joyce rn  Date Charge Nurse/RN Notified 09/24/21  Time Charge Nurse/RN Notified 0130  Document  Progress note created (see row info) Yes

## 2021-09-24 NOTE — TOC Progression Note (Signed)
Transition of Care [facility]) - Progression Note    Patient Details  Name: Star Cheese MRN: [id number] Date of Birth: 04-19-1946  [facility] CM/SW Contact  Joanne Chars, LCSW Phone Number: 09/24/2021, 3:40 PM  Clinical Narrative:   CSW spoke with pt son [name], will send referral to [employer].  Pt has UHC Medicare, no LTC payer, has been paying out of pocket for [employer] and will continue to do so.  Referral sent to Teena/Demetra at [facility] along with follow up phone call.  They will discuss with administration.     Expected Discharge Plan: Long Term Nursing Home Barriers to Discharge: Continued Medical Work up  Expected Discharge Plan and Services Expected Discharge Plan: [place] In-house Referral: Clinical Social Work   Post Acute Care Choice: Nursing Home Living arrangements for the past 2 months: [place]                                       Social Determinants of Health (SDOH) Interventions    Readmission Risk Interventions Readmission Risk Prevention Plan 03/21/2021 10/06/2019 10/06/2019  Transportation Screening Complete - Complete  PCP or Specialist Appt within 3-5 Days Complete Complete -  HRI or Home Care Consult Complete - Complete  Social Work Consult for [place] Planning/Counseling Complete - Complete  Palliative Care Screening Complete - Not Applicable  Medication Review Press photographer) Complete - Complete  Some recent data might be hidden

## 2021-09-24 NOTE — Progress Notes (Addendum)
I have seen and examined the patient. I have personally reviewed the clinical findings, laboratory findings, microbiological data and imaging studies. The assessment and treatment plan was discussed with the Nurse Practitioner Janene Madeira. I agree with her/his recommendations except following additions/corrections.  Started on Remdesevir yesterday as his CT value was 30.2 s/o recent infection He continues to be lethargic and non responsive to painful stimuli  Facial asymmetry noted Remains on [place]  Afebrile, no leukocytosis  [place] head CT is pending  TTE with ? Bicuspid AV , mild calcification of the AV, moderate thickening of the AV, mild to moderate AVR, mild AS TEE is pending   High clinical suspicion for endocarditis, concern for metabolic encephalopathy as well as hepatic encephalopathy given hypernatremia and liver cirrhosis. NG tube being placed for lactulose due to difficulty with lactulose enema  Continue Vancomycin, pharmacy to dose, monitor trough Fu repeat blood cultures  Complete course of remdesevir and steroids per protocol Monitor CBC , BMP and Vancomycin trough  Following   Rosiland Oz, MD Infectious Disease Physician [facility] [address] [place], [place] [address] Phone: [phone]   Fax: [facility]  Date of Admission:  09/19/2021      Total days of antibiotics 3   Vancomycin 2/24 >> c  Remdesivir 2/27 >> c           ASSESSMENT: Brandon [name] is a 76 y.o. male admitted with acute respiratory failure d/t COVID-19 infection and MRSA bacteremia from undetermined source. Exam concerning for left sided facial droop today with vomiting/uncontrolled secretions overnight requiring NGT placement. Head CT has been ordered for evaluation. TTE with fairly abnormal aortic valve (possibly bicuspid, multiple calcifications and worsened AI) --> concerning  features for endocarditis with CNS embolization given his community acquired bacteremia.   Will follow up [place] results. No changes for antibiotics at this time. Vancomycin with AUC info pending with peak 39 and trough to result today.   Acute COVID-19 - improving with oxygen requirements down to 1 LPM. Does not seem he aspirated o/n. Continue course of remdesivir steroids per primary team.   AMS - persistent. Concern for CNS emboli/stroke (bacteremic with MRSA and abnormal AoV + chronic atrial fibrillation).      PLAN: Continue vancomycin  Follow up [facility] results  Follow pending micro    Principal Problem:   MRSA bacteremia Active Problems:   Acute COVID-19   DM (diabetes mellitus), type 2 (HCC)   Cirrhosis of liver (HCC)   Encephalopathy, hepatic   Encephalopathy   Altered mental status    Chlorhexidine Gluconate Cloth  6 each Topical Daily   dexamethasone (DECADRON) injection  6 mg Intravenous Q24H   enoxaparin (LOVENOX) injection  60 mg Subcutaneous Q24H   insulin aspart  0-15 Units Subcutaneous Q4H   lactulose  20 g Per Tube 6 X Daily    SUBJECTIVE: Non-verbal. Grunts with noxious stimuli.    Review of Systems: Review of Systems  Unable to perform ROS: Patient nonverbal    No Known Allergies  OBJECTIVE: Vitals:   09/23/21 2327 09/24/21 0118 09/24/21 0315 09/24/21 0515  BP: 112/64 127/80 138/78 138/76  Pulse: 94     Resp: 18 18 20 18   Temp: 97.8 F (36.6 C) 99.1 F (37.3 C) 98.9 F (37.2 C) 98.5 F (36.9 C)  TempSrc:   Axillary Axillary  SpO2: 96%  Weight:      Height:       Body mass index is 36.62 kg/m.  Physical Exam Vitals reviewed.  Constitutional:      Comments: Lethargic, arousable with noxious stimulation   HENT:     Mouth/Throat:     Mouth: Mucous membranes are dry.  Cardiovascular:     Rate and Rhythm: Rhythm irregular.     Comments: Atrial fib on telemetry  Pulmonary:     Breath sounds: Rhonchi (Upper airway rhonchi)  present.  Abdominal:     General: Bowel sounds are normal.  Skin:    General: Skin is warm and dry.  Neurological:     Comments: Unable to assess for deficits d/t AMS. Appears he may have left sided facial droop present today.      Lab Results Lab Results  Component Value Date   WBC 9.7 09/24/2021   HGB 11.1 (L) 09/24/2021   HCT 38.1 (L) 09/24/2021   MCV 90.9 09/24/2021   PLT 83 (L) 09/24/2021    Lab Results  Component Value Date   CREATININE 1.17 09/24/2021   BUN 57 (H) 09/24/2021   NA 151 (H) 09/24/2021   K 4.2 09/24/2021   CL 118 (H) 09/24/2021   CO2 26 09/24/2021    Lab Results  Component Value Date   ALT 21 09/24/2021   AST 20 09/24/2021   ALKPHOS 60 09/24/2021   BILITOT 0.4 09/24/2021     Microbiology: Recent Results (from the past 240 hour(s))  Urine Culture     Status: Abnormal   Collection Time: 08/31/2021 12:21 PM   Specimen: In/Out Cath Urine  Result Value Ref Range Status   Specimen Description IN/OUT CATH URINE  Final   Special Requests   Final    NONE Performed at [facility], [address] [place], [place] [address]    Culture MULTIPLE SPECIES PRESENT, SUGGEST RECOLLECTION (A)  Final   Report Status 09/21/2021 FINAL  Final  Blood Culture (routine x 2)     Status: Abnormal   Collection Time: 09/13/2021 12:30 PM   Specimen: BLOOD RIGHT HAND  Result Value Ref Range Status   Specimen Description BLOOD RIGHT HAND  Final   Special Requests   Final    BOTTLES DRAWN AEROBIC AND ANAEROBIC Blood Culture adequate volume   Culture  Setup Time   Final    GRAM POSITIVE COCCI IN BOTH AEROBIC AND ANAEROBIC BOTTLES CRITICAL RESULT CALLED TO, READ BACK BY AND VERIFIED WITH: PHARMD JAMES LEDFORD 09/21/21@00 :51 BY TW Performed at [facility], [place] [address] [place], [place] [address]    Culture METHICILLIN RESISTANT STAPHYLOCOCCUS AUREUS (A)  Final   Report Status 09/23/2021 FINAL  Final   Organism ID, Bacteria METHICILLIN RESISTANT  STAPHYLOCOCCUS AUREUS  Final      Susceptibility   Methicillin resistant staphylococcus aureus - MIC*    CIPROFLOXACIN 1 SENSITIVE Sensitive     ERYTHROMYCIN >=8 RESISTANT Resistant     GENTAMICIN <=0.5 SENSITIVE Sensitive     OXACILLIN >=4 RESISTANT Resistant     TETRACYCLINE <=1 SENSITIVE Sensitive     VANCOMYCIN <=0.5 SENSITIVE Sensitive     TRIMETH/SULFA <=10 SENSITIVE Sensitive     CLINDAMYCIN <=0.25 SENSITIVE Sensitive     RIFAMPIN <=0.5 SENSITIVE Sensitive     Inducible Clindamycin NEGATIVE Sensitive     * METHICILLIN RESISTANT STAPHYLOCOCCUS AUREUS  Blood Culture (routine x 2)     Status: Abnormal   Collection Time: 09/22/2021 12:30 PM  Specimen: BLOOD RIGHT HAND  Result Value Ref Range Status   Specimen Description BLOOD RIGHT HAND  Final   Special Requests   Final    BOTTLES DRAWN AEROBIC AND ANAEROBIC Blood Culture adequate volume   Culture  Setup Time   Final    GRAM POSITIVE COCCI IN BOTH AEROBIC AND ANAEROBIC BOTTLES CRITICAL VALUE NOTED.  VALUE IS CONSISTENT WITH PREVIOUSLY REPORTED AND CALLED VALUE.    Culture (A)  Final    STAPHYLOCOCCUS AUREUS SUSCEPTIBILITIES PERFORMED ON PREVIOUS CULTURE WITHIN THE LAST 5 DAYS. Performed at [facility], [place] [address], [place], [place] [address]    Report Status 09/23/2021 FINAL  Final  Blood Culture ID Panel (Reflexed)     Status: Abnormal   Collection Time: 09/06/2021 12:30 PM  Result Value Ref Range Status   Enterococcus faecalis NOT DETECTED NOT DETECTED Final   Enterococcus Faecium NOT DETECTED NOT DETECTED Final   Listeria monocytogenes NOT DETECTED NOT DETECTED Final   Staphylococcus species DETECTED (A) NOT DETECTED Final    Comment: CRITICAL RESULT CALLED TO, READ BACK BY AND VERIFIED WITH: PHARMD JAMES LEDFORD 09/21/21@00 :51 BY TW    Staphylococcus aureus (BCID) DETECTED (A) NOT DETECTED Final    Comment: Methicillin (oxacillin)-resistant Staphylococcus aureus (MRSA). MRSA is predictably resistant to  beta-lactam antibiotics (except ceftaroline). Preferred therapy is vancomycin unless clinically contraindicated. Patient requires contact precautions if  hospitalized. CRITICAL RESULT CALLED TO, READ BACK BY AND VERIFIED WITH: PHARMD JAMES LEDFORD 09/21/21@00 :51 BY TW    Staphylococcus epidermidis NOT DETECTED NOT DETECTED Final   Staphylococcus lugdunensis NOT DETECTED NOT DETECTED Final   Streptococcus species NOT DETECTED NOT DETECTED Final   Streptococcus agalactiae NOT DETECTED NOT DETECTED Final   Streptococcus pneumoniae NOT DETECTED NOT DETECTED Final   Streptococcus pyogenes NOT DETECTED NOT DETECTED Final   A.calcoaceticus-baumannii NOT DETECTED NOT DETECTED Final   Bacteroides fragilis NOT DETECTED NOT DETECTED Final   Enterobacterales NOT DETECTED NOT DETECTED Final   Enterobacter cloacae complex NOT DETECTED NOT DETECTED Final   Escherichia coli NOT DETECTED NOT DETECTED Final   Klebsiella aerogenes NOT DETECTED NOT DETECTED Final   Klebsiella oxytoca NOT DETECTED NOT DETECTED Final   Klebsiella pneumoniae NOT DETECTED NOT DETECTED Final   Proteus species NOT DETECTED NOT DETECTED Final   Salmonella species NOT DETECTED NOT DETECTED Final   Serratia marcescens NOT DETECTED NOT DETECTED Final   Haemophilus influenzae NOT DETECTED NOT DETECTED Final   Neisseria meningitidis NOT DETECTED NOT DETECTED Final   Pseudomonas aeruginosa NOT DETECTED NOT DETECTED Final   Stenotrophomonas maltophilia NOT DETECTED NOT DETECTED Final   Candida albicans NOT DETECTED NOT DETECTED Final   Candida auris NOT DETECTED NOT DETECTED Final   Candida glabrata NOT DETECTED NOT DETECTED Final   Candida krusei NOT DETECTED NOT DETECTED Final   Candida parapsilosis NOT DETECTED NOT DETECTED Final   Candida tropicalis NOT DETECTED NOT DETECTED Final   Cryptococcus neoformans/gattii NOT DETECTED NOT DETECTED Final   Meth resistant mecA/C and MREJ DETECTED (A) NOT DETECTED Final    Comment: CRITICAL  RESULT CALLED TO, READ BACK BY AND VERIFIED WITH: PHARMD JAMES LEDFORD 09/21/21@00 :51 BY TW Performed at [facility], [address], [place], [place] [address]   Resp Panel by RT-PCR (Flu A&B, Covid) Nasopharyngeal Swab     Status: Abnormal   Collection Time: 09/18/2021 12:34 PM   Specimen: Nasopharyngeal Swab; Nasopharyngeal(NP) swabs in vial transport medium  Result Value Ref Range Status   SARS Coronavirus  2 by RT PCR POSITIVE (A) NEGATIVE Final    Comment: (NOTE) SARS-CoV-2 target nucleic acids are DETECTED.  The SARS-CoV-2 RNA is generally detectable in upper respiratory specimens during the acute phase of infection. Positive results are indicative of the presence of the identified virus, but do not rule out bacterial infection or co-infection with other pathogens not detected by the test. Clinical correlation with patient history and other diagnostic information is necessary to determine patient infection status. The expected result is Negative.  Fact Sheet for Patients: EntrepreneurPulse.com.au  Fact Sheet for Healthcare Providers: IncredibleEmployment.be  This test is not yet approved or cleared by the [place] FDA and  has been authorized for detection and/or diagnosis of SARS-CoV-2 by FDA under an Emergency Use Authorization (EUA).  This EUA will remain in effect (meaning this test can be used) for the duration of  the COVID-19 declaration under Section 564(b)(1) of the A ct, 21 U.S.C. section 360bbb-3(b)(1), unless the authorization is terminated or revoked sooner.     Influenza A by PCR NEGATIVE NEGATIVE Final   Influenza B by PCR NEGATIVE NEGATIVE Final    Comment: (NOTE) The Xpert Xpress SARS-CoV-2/FLU/RSV plus assay is intended as an aid in the diagnosis of influenza from Nasopharyngeal swab specimens and should not be used as a sole basis for treatment. Nasal washings and aspirates are unacceptable for Xpert Xpress  SARS-CoV-2/FLU/RSV testing.  Fact Sheet for Patients: EntrepreneurPulse.com.au  Fact Sheet for Healthcare Providers: IncredibleEmployment.be  This test is not yet approved or cleared by the [place] FDA and has been authorized for detection and/or diagnosis of SARS-CoV-2 by FDA under an Emergency Use Authorization (EUA). This EUA will remain in effect (meaning this test can be used) for the duration of the COVID-19 declaration under Section 564(b)(1) of the Act, 21 U.S.C. section 360bbb-3(b)(1), unless the authorization is terminated or revoked.  Performed at [facility], [place] [address], [place], [place] [address]   Urine Culture     Status: Abnormal   Collection Time: 09/21/21  3:28 PM   Specimen: Urine, Bag (ped)  Result Value Ref Range Status   Specimen Description Ur, Bag ped  Final   Special Requests NONE  Final   Culture (A)  Final    <10,000 COLONIES/mL INSIGNIFICANT GROWTH Performed at [facility], [place] [address] [place], [place] [address]    Report Status 09/23/2021 FINAL  Final  Culture, blood (routine x 2)     Status: None (Preliminary result)   Collection Time: 09/22/21  5:54 PM   Specimen: BLOOD RIGHT HAND  Result Value Ref Range Status   Specimen Description BLOOD RIGHT HAND  Final   Special Requests   Final    BOTTLES DRAWN AEROBIC ONLY Blood Culture results may not be optimal due to an inadequate volume of blood received in culture bottles   Culture   Final    NO GROWTH 2 DAYS Performed at [facility], [place] [address] [place], [place] [address]    Report Status PENDING  Incomplete  Culture, blood (routine x 2)     Status: None (Preliminary result)   Collection Time: 09/22/21  5:54 PM   Specimen: BLOOD  Result Value Ref Range Status   Specimen Description BLOOD RIGHT ANTECUBITAL  Final   Special Requests   Final    BOTTLES DRAWN AEROBIC ONLY Blood Culture adequate volume   Culture   Final     NO GROWTH 2 DAYS Performed at [facility], [place] [address], [place], [place] [address]    Report Status PENDING  Incomplete   Imagings DG Abd 1 View  Result Date: 09/24/2021 CLINICAL DATA:  NG tube placement. EXAM: ABDOMEN - 1 VIEW COMPARISON:  09/17/2021. FINDINGS: The bowel gas pattern is normal. An enteric tube terminates over the left upper quadrant in the anticipated region of the stomach. No radio-opaque calculi. There is atherosclerotic calcification of the abdominal aorta. Degenerative changes are present in the thoracolumbar spine. IMPRESSION: An enteric tube terminates in the left upper quadrant in the anticipated region of the stomach. Electronically Signed   By: Brett Fairy M.D.   On: 09/24/2021 00:57   CT HEAD WO CONTRAST (5MM)  Result Date: 09/24/2021 CLINICAL DATA:  Altered mental status EXAM: CT HEAD WITHOUT CONTRAST TECHNIQUE: Contiguous axial images were obtained from the base of the skull through the vertex without intravenous contrast. RADIATION DOSE REDUCTION: This exam was performed according to the departmental dose-optimization program which includes automated exposure control, adjustment of the mA and/or kV according to patient size and/or use of iterative reconstruction technique. COMPARISON:  CT head 03/20/2021 FINDINGS: Brain: There is hypodensity in the right cerebellar hemisphere in the SCA distribution consistent with acute to subacute infarct. There is no associated hemorrhage or mass effect. The fourth ventricle remains patent. There is no other evidence of acute infarct. There is no acute intracranial hemorrhage or extra-axial fluid collection. There is background global parenchymal volume loss with prominence of the ventricular system and extra-axial CSF spaces. Confluent hypodensity in the subcortical and periventricular white matter likely reflects sequela of chronic white matter microangiopathy. There is no mass lesion.  There is no midline shift. Vascular:  There is calcification of the bilateral cavernous ICAs. Skull: Normal. Negative for fracture or focal lesion. Sinuses/Orbits: There is opacification of the maxillary sinuses. Bilateral lens implants are in place. The globes and orbits are otherwise unremarkable. Other: None. 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 These results were called by telephone at the time of interpretation on 09/24/2021 at 10:02 am to provider Dr Posey Pronto, who verbally acknowledged these results. Electronically Signed   By: Valetta Mole M.D.   On: 09/24/2021 10:04   DG Abd Portable 1V  Result Date: 09/24/2021 CLINICAL DATA:  Nasogastric tube placement EXAM: PORTABLE ABDOMEN - 1 VIEW COMPARISON:  Portable exam 0635 hours compared to 09/24/2021 at 0040 hours FINDINGS: Tip of nasogastric tube projects over proximal to mid stomach. Nonobstructive bowel gas pattern. Multilevel degenerative changes thoracolumbar spine. IMPRESSION: Tip of nasogastric tube projects over proximal to mid stomach. Electronically Signed   By: Lavonia Dana M.D.   On: 09/24/2021 07:35   ECHOCARDIOGRAM COMPLETE  Result Date: 09/23/2021    ECHOCARDIOGRAM REPORT   Patient Name:   Brandon [name] Date of Exam: 09/23/2021 Medical Rec #:  [id number]    Height:       72.0 in Accession #:    [id number]   Weight:       270.0 lb Date of Birth:  05/14/1946    BSA:          2.420 m Patient Age:    65 years     BP:           123/78 mmHg Patient Gender: M            HR:           83 bpm.  Exam Location:  Inpatient Procedure: 2D Echo, Cardiac Doppler and Color Doppler Indications:    Bacteremia R78.81  History:        Patient has prior history of Echocardiogram examinations, most                 recent 05/26/2020. CAD, Arrythmias:Atrial Fibrillation; Risk                 Factors:Hypertension and Diabetes.  Sonographer:    Bernadene Person RDCS Referring Phys: [id number] [place]  1. Left ventricular ejection fraction, by estimation, is 55 to 60%. The left ventricle has normal function. The left ventricle has no regional wall motion abnormalities. There is mild asymmetric left ventricular hypertrophy of the basal-septal segment. Diastolic function indeterminant due to Afib.  2. Right ventricular systolic function is mildly reduced. The right ventricular size is normal. There is normal pulmonary artery systolic pressure. The estimated right ventricular systolic pressure is 62.5 mmHg.  3. Left atrial size was moderately dilated.  4. Right atrial size was moderately dilated.  5. The mitral valve is grossly normal. Mild mitral valve regurgitation.  6. The aortic valve has an indeterminant number of cusps (? possibly bicuspid). There is mild calcification of the aortic valve. There is moderate thickening of the aortic valve. Aortic valve regurgitation is mild to moderate. Mild aortic valve stenosis. Aortic valve area, by VTI measures 1.31 cm. Aortic valve mean gradient measures 13.0 mmHg. Aortic valve Vmax measures 2.25 m/s.  7. Aortic dilatation noted. There is mild dilatation of the aortic root, measuring 39 mm.  8. The inferior vena cava is normal in size with greater than 50% respiratory variability, suggesting right atrial pressure of 3 mmHg. Comparison(s): Compared to prior TTE on 04/2020, the AR now appears mild-to-moderate and there continues to be mild AS (prior mean gradient 48mmHg). FINDINGS  Left Ventricle: Left ventricular ejection fraction, by estimation, is 55 to 60%. The left ventricle has normal function. The left ventricle has no regional wall motion abnormalities. The left ventricular internal cavity size was normal in size. There is  mild asymmetric left ventricular hypertrophy of the basal-septal segment. Diastolic function indeterminant due to Afib. Right Ventricle: The right ventricular size is normal. No increase in right ventricular wall thickness. Right  ventricular systolic function is mildly reduced. There is normal pulmonary artery systolic pressure. The tricuspid regurgitant velocity is 2.57 m/s, and with an assumed right atrial pressure of 3 mmHg, the estimated right ventricular systolic pressure is 63.8 mmHg. Left Atrium: Left atrial size was moderately dilated. Right Atrium: Right atrial size was moderately dilated. Pericardium: There is no evidence of pericardial effusion. Mitral Valve: The mitral valve is grossly normal. Mild mitral valve regurgitation. Tricuspid Valve: The tricuspid valve is normal in structure. Tricuspid valve regurgitation is trivial. Aortic Valve: The aortic valve has an indeterminant number of cusps. There is mild calcification of the aortic valve. There is moderate thickening of the aortic valve. Aortic valve regurgitation is mild to moderate. Aortic regurgitation PHT measures 464 msec. Mild aortic stenosis is present. Aortic valve mean gradient measures 13.0 mmHg. Aortic valve peak gradient measures 20.2 mmHg. Aortic valve area, by VTI measures 1.31 cm. Pulmonic Valve: The pulmonic valve was normal in structure. Pulmonic valve regurgitation is trivial. Aorta: Aortic dilatation noted. There is mild dilatation of the aortic root, measuring 39 mm. Venous: The inferior vena cava is normal in size with greater than 50% respiratory variability, suggesting right atrial pressure of 3 mmHg. IAS/Shunts:  The atrial septum is grossly normal.  LEFT VENTRICLE PLAX 2D LVIDd:         4.70 cm LVIDs:         3.30 cm LV PW:         0.90 cm LV IVS:        1.20 cm LVOT diam:     2.10 cm LV SV:         54 LV SV Index:   22 LVOT Area:     3.46 cm  LV Volumes (MOD) LV vol d, MOD A2C: 108.0 ml LV vol d, MOD A4C: 91.1 ml LV vol s, MOD A2C: 57.3 ml LV vol s, MOD A4C: 40.4 ml LV SV MOD A2C:     50.7 ml LV SV MOD A4C:     91.1 ml LV SV MOD BP:      53.5 ml RIGHT VENTRICLE RV S prime:     11.50 cm/s TAPSE (M-mode): 1.2 cm LEFT ATRIUM             Index         RIGHT ATRIUM           Index LA diam:        3.20 cm 1.32 cm/m   RA Area:     24.90 cm LA Vol (A2C):   66.5 ml 27.47 ml/m  RA Volume:   80.50 ml  33.26 ml/m LA Vol (A4C):   89.6 ml 37.02 ml/m LA Biplane Vol: 80.7 ml 33.34 ml/m  AORTIC VALVE AV Area (Vmax):    1.32 cm AV Area (Vmean):   1.20 cm AV Area (VTI):     1.31 cm AV Vmax:           225.00 cm/s AV Vmean:          163.333 cm/s AV VTI:            0.415 m AV Peak Grad:      20.2 mmHg AV Mean Grad:      13.0 mmHg LVOT Vmax:         85.90 cm/s LVOT Vmean:        56.425 cm/s LVOT VTI:          0.157 m LVOT/AV VTI ratio: 0.38 AI PHT:            464 msec AR Vena Contracta: 0.30 cm  AORTA Ao Root diam: 3.90 cm Ao Asc diam:  4.10 cm TRICUSPID VALVE TR Peak grad:   26.4 mmHg TR Vmax:        257.00 cm/s  SHUNTS Systemic VTI:  0.16 m Systemic Diam: 2.10 cm Gwyndolyn Kaufman MD Electronically signed by Gwyndolyn Kaufman MD Signature Date/Time: 09/23/2021/1:55:17 PM    Final     Janene Madeira, MSN, NP-[facility] [email or web address] Pager: [phone] Office: [phone] [facility] Main Line: [place] [employer] Welcome

## 2021-09-24 NOTE — Progress Notes (Signed)
°   09/24/21 1000  Assess: MEWS Score  Temp 98.5 F (36.9 C)  BP 125/86  Pulse Rate (!) 111  ECG Heart Rate (!) 103  Resp (!) 24  Level of Consciousness Responds to Pain  SpO2 95 %  O2 Device Nasal Cannula  O2 Flow Rate (L/min) 3 L/min  Assess: MEWS Score  MEWS Temp 0  MEWS Systolic 0  MEWS Pulse 1  MEWS RR 1  MEWS LOC 2  MEWS Score 4  MEWS Score Color Red  Assess: if the MEWS score is Yellow or Red  Were vital signs taken at a resting state? Yes  Focused Assessment No change from prior assessment  Early Detection of Sepsis Score *See Row Information* Medium  MEWS guidelines implemented *See Row Information* Yes  Treat  Pain Scale PAINAD  Breathing 0  Negative Vocalization 0  Facial Expression 0  Body Language 0  Consolability 0  PAINAD Score 0  Take Vital Signs  Increase Vital Sign Frequency  Red: Q 1hr X 4 then Q 4hr X 4, if remains red, continue Q 4hrs  Escalate  MEWS: Escalate Red: discuss with charge nurse/RN and provider, consider discussing with RRT  Notify: Charge Nurse/RN  Name of Charge Nurse/RN Notified Sam Rn  Date Charge Nurse/RN Notified 09/24/21  Time Charge Nurse/RN Notified 1000  Notify: Provider  Provider Stacie Acres, MD  Date Provider Notified 09/24/21  Notification Type Page  Notification Reason Other (Comment) (red mews)  Notify: Rapid Response  Name of Rapid Response RN Notified ana Rn  Date Rapid Response Notified 09/24/21  Time Rapid Response Notified 1324  Document  Patient Outcome Not stable and remains on department  Progress note created (see row info) Yes

## 2021-09-24 NOTE — NC FL2 (Signed)
[place] LEVEL OF CARE SCREENING TOOL     IDENTIFICATION  Patient Name: Brandon [name] Birthdate: 1946/07/15 Sex: male Admission Date (Current Location): 09/18/2021  [facility] and [place] Number:  Herbalist and Address:  The [place], [place] [address], [place], [place] [address]      Provider Number: [id number]  Attending Physician Name and Address:  Velna Ochs, MD  Relative Name and Phone Number:  Wildon, Cuevas   [phone]    Current Level of Care: Hospital Recommended Level of Care: [place] Prior Approval Number:    Date Approved/Denied:   PASRR Number: [id number]  Discharge Plan: SNF    Current Diagnoses: Patient Active Problem List   Diagnosis Date Noted   Acute COVID-19 09/23/2021   Altered mental status    MRSA bacteremia 09/21/2021   Encephalopathy 09/10/2021   Encephalopathy, hepatic 03/20/2021   Pressure injury of skin 03/16/2021   UTI (urinary tract infection) 03/16/2021   Generalized weakness 03/15/2021   Disp supracondyl fx w/o intracondylar extension of right distal femur ([place]) 02/05/2021   Hypomagnesemia 02/04/2021   Fall at home, initial encounter 02/04/2021   Cirrhosis of liver ([place])    Chronic heart failure with preserved ejection fraction (HFpEF) ([place]) 06/07/2020   Hyperkalemia 06/07/2020   Weakness generalized 06/07/2020   Physical deconditioning 06/06/2020   Hypothermia 05/31/2020   Bullous pemphigoid 84/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   Alcoholic cirrhosis of liver with ascites ([place])    Portal hypertensive gastropathy ([place])    Melena 10/02/2019   Angiodysplasia of stomach with hemorrhage    Gastrointestinal hemorrhage    Atrial fibrillation (HCC)    Chronic pain syndrome    Symptomatic anemia 08/13/2019   Acute upper GI bleed 08/13/2019   Anemia 07/2019   Avulsion of right patellar tendon    Closed displaced fracture of phalanx of toe of right foot 10/06/2018   Obesity BMI over 52  09/30/2018   Patella fracture, extensor mechanism disruption R knee  09/29/2018   History of colon cancer 08/23/2018   CAD (coronary artery disease) 08/23/2018   DM (diabetes mellitus), type 2 ([place]) 08/23/2018   Pyuria 08/23/2018    Orientation RESPIRATION BLADDER Height & Weight      (currently not oriented)  O2 Incontinent, Indwelling catheter Weight: 270 lb (122.5 kg) Height:  6' (182.9 cm)  BEHAVIORAL SYMPTOMS/MOOD NEUROLOGICAL BOWEL NUTRITION STATUS      Incontinent Diet (see discharge summary)  AMBULATORY STATUS COMMUNICATION OF NEEDS Skin   Total Care Does not communicate Other (Comment) (dry, flaky)                       Personal Care Assistance Level of Assistance  Total care       Total Care Assistance: Maximum assistance   Functional Limitations Info  Sight, Hearing, Speech Sight Info: Adequate Hearing Info: Adequate Speech Info: Adequate    SPECIAL CARE FACTORS FREQUENCY                       Contractures Contractures Info: Not present    Additional Factors Info  Code Status, Allergies, Insulin Sliding Scale Code Status Info: DNR Allergies Info: NKA   Insulin Sliding Scale Info: Novolog: 0-15 units q4 hours.  See discharge summary.       Current Medications (09/24/2021):  This is the current hospital active medication list Current Facility-Administered Medications  Medication Dose Route Frequency Provider Last Rate Last Admin  acetaminophen (TYLENOL) suppository 650 mg  650 mg Rectal Q4H PRN Rick Duff, MD   650 mg at 09/04/2021 1955   chlorhexidine (PERIDEX) 0.12 % solution 15 mL  15 mL Mouth Rinse BID Velna Ochs, MD   15 mL at 09/24/21 1109   Chlorhexidine Gluconate Cloth 2 % PADS 6 each  6 each Topical Daily Angelica Pou, MD   6 each at 09/24/21 1109   dexamethasone (DECADRON) injection 6 mg  6 mg Intravenous Q24H Rick Duff, MD   6 mg at 09/23/21 2209   dextrose 5 % solution   Intravenous Continuous Lajean Manes, MD   Paused at 09/24/21 0910   enoxaparin (LOVENOX) injection 60 mg  60 mg Subcutaneous Q24H Lajean Manes, MD   60 mg at 09/23/21 1636   insulin aspart (novoLOG) injection 0-15 Units  0-15 Units Subcutaneous Q4H Lajean Manes, MD   5 Units at 09/24/21 1321   lactulose (CHRONULAC) 10 GM/15ML solution 20 g  20 g Per Tube 6 X Daily Orvis Brill, MD   20 g at 09/24/21 1322   MEDLINE mouth rinse  15 mL Mouth Rinse q12n4p Velna Ochs, MD   15 mL at 09/24/21 1322   remdesivir 100 mg in sodium chloride 0.9 % 100 mL IVPB  100 mg Intravenous Daily Rosiland Oz, MD 200 mL/hr at 09/24/21 1110 100 mg at 09/24/21 1110   vancomycin (VANCOREADY) IVPB 1250 mg/250 mL  1,250 mg Intravenous Q24H Angelica Pou, MD 166.7 mL/hr at 09/24/21 1321 1,250 mg at 09/24/21 1321     Discharge Medications: Please see discharge summary for a list of discharge medications.  Relevant Imaging Results:  Relevant Lab Results:   Additional Information SSI 068 [id number]. Pfizer 02/12/20, 03/05/20  Joanne Chars, LCSW

## 2021-09-24 NOTE — Consult Note (Addendum)
Neurology Consultation  Reason for Consult: Left Facial Droop and right sided weakness Referring Physician: Lajean Manes MD  CC: Acute Metabolic Encephalopathy  History is obtained from: chart review  HPI: Chayanne Speir is a 76 y.o. male with chronic afib not on AC d/t GI bleed, CAD, colon cancer, T2DM, HTN, cirrhosis, morbid obesity with chronic indwelling catheter presenting to ED from nursing facility due to altered mental status. Patient was admitted due to acute metabolic encephalopathy due to COVID-19 infection and MRSA bacteremia. There is also some concern that patient has hepatic encephalopathy as well.  Neurology was consulted due to left facial droop, left gaze preference and decreased movement in RUE compared to LUE. 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  No family at bedside. Patient minimally grunts and withdraws to noxious stimuli but does not open eyes.   LKW: 2/24 TNK given: No, outside of window LVO: None. NIHSS components Score: Comment  1a Level of Conscious 0[]  1[]  2[]  3[x]      1b LOC Questions 0[]  1[]  2[x]       1c LOC Commands 0[]  1[]  2[x]       2 Best Gaze 0[]  1[x]  2[]       3 Visual 0[x]  1[]  2[]  3[]      4 Facial Palsy 0[]  1[]  2[x]  3[]      5a Motor Arm - left 0[]  1[x]  2[]  3[]  4[]  UN[]    5b Motor Arm - Right 0[]  1[]  2[]  3[x]  4[]  UN[]    6a Motor Leg - Left 0[]  1[]  2[]  3[x]  4[]  UN[]    6b Motor Leg - Right 0[]  1[]  2[]  3[x]  4[]  UN[]    7 Limb Ataxia 0[x]  1[]  2[]  3[]  UN[]     8 Sensory 0[x]  1[]  2[]  UN[]      9 Best Language 0[]  1[]  2[]  3[x]      10 Dysarthria 0[]  1[]  2[x]  UN[]      11 Extinct. and Inattention 0[]  1[x]  2[]       TOTAL: 25      ROS: A complete ROS was performed and is negative except as noted in the HPI.   Past Medical History:  Diagnosis Date   Anemia 07/2019   Hb 6.7 >> 3 PRBCs >> 8.8 in 07/2019.      Atrial fibrillation ([place])    Cholelithiasis    Cirrhosis of  liver ([place])    Presumed due to EtOH as well as fatty liver from morbid obesity.  Cirrhosis evident on CT scan 07/2018 but finding overlooked and formal diagnoses not made until 07/2019   Colon cancer [facility]) 1999   Partial colectomy   Coronary artery disease    Diabetes mellitus without complication ([place])    Diverticulosis    Esophageal varices (HCC)    Gastric AVM    Gastric hemorrhage due to angiodysplasia of stomach 07/2019   Obesity 07/2019   Portal hypertension (HCC)    Tubular adenoma of colon     Family History  Problem Relation Age of Onset   Colon cancer Father    CAD Other    Colon cancer Paternal Uncle    Colon cancer Paternal Uncle    Diabetes Neg Hx     Social History:   reports that he quit smoking about 33 years ago. His smoking use included cigarettes. He has never used smokeless tobacco. He reports that he does not currently use alcohol. He reports that he does not use drugs.  Medications  Current Facility-Administered Medications:  acetaminophen (TYLENOL) suppository 650 mg, 650 mg, Rectal, Q4H PRN, Rick Duff, MD, 650 mg at 08/30/2021 1955   chlorhexidine (PERIDEX) 0.12 % solution 15 mL, 15 mL, Mouth Rinse, BID, Velna Ochs, MD, 15 mL at 09/24/21 1109   Chlorhexidine Gluconate Cloth 2 % PADS 6 each, 6 each, Topical, Daily, Angelica Pou, MD, 6 each at 09/24/21 1109   dexamethasone (DECADRON) injection 6 mg, 6 mg, Intravenous, Q24H, Rick Duff, MD, 6 mg at 09/23/21 2209   dextrose 5 % solution, , Intravenous, Continuous, Lajean Manes, MD, Paused at 09/24/21 0910   enoxaparin (LOVENOX) injection 60 mg, 60 mg, Subcutaneous, Q24H, Lajean Manes, MD, 60 mg at 09/23/21 1636   insulin aspart (novoLOG) injection 0-15 Units, 0-15 Units, Subcutaneous, Q4H, Lajean Manes, MD, 5 Units at 09/24/21 1107   lactulose (CHRONULAC) 10 GM/15ML solution 20 g, 20 g, Per Tube, 6 X Daily, Orvis Brill, MD, 20 g at 09/24/21 1107   MEDLINE mouth rinse, 15  mL, Mouth Rinse, q12n4p, Velna Ochs, MD   [COMPLETED] remdesivir 200 mg in sodium chloride 0.9% 250 mL IVPB, 200 mg, Intravenous, Once, Stopped at 09/23/21 1641 **FOLLOWED BY** remdesivir 100 mg in sodium chloride 0.9 % 100 mL IVPB, 100 mg, Intravenous, Daily, Rosiland Oz, MD, Last Rate: 200 mL/hr at 09/24/21 1110, 100 mg at 09/24/21 1110   vancomycin (VANCOREADY) IVPB 1250 mg/250 mL, 1,250 mg, Intravenous, Q24H, Angelica Pou, MD, Last Rate: 166.7 mL/hr at 09/23/21 1214, 1,250 mg at 09/23/21 1214   Exam: Current vital signs: BP 133/80    Pulse (!) 102    Temp 98.2 F (36.8 C)    Resp (!) 23    Ht 6' (1.829 m)    Wt 122.5 kg    SpO2 94%    BMI 36.62 kg/m  Vital signs in last 24 hours: Temp:  [97.8 F (36.6 C)-99.1 F (37.3 C)] 98.2 F (36.8 C) (02/28 1111) Pulse Rate:  [86-111] 102 (02/28 1111) Resp:  [16-24] 23 (02/28 1111) BP: (107-138)/(64-86) 133/80 (02/28 1111) SpO2:  [90 %-96 %] 94 % (02/28 1111)  GENERAL: Morbidly obese caucasian male. Does not appear to be in acute distress but ill-appearing. Head: Normocephalic and atraumatic, without obvious abnormality LUNGS: Normal respiratory effort. CV: Tachycardic and irregular rhythm ABDOMEN: Soft, non-tender, non-distended Extremities: warm, well perfused, without obvious deformity  NEURO:  Mental Status: Obtunded and minimally responsive. Unable to follow commands. Eyes closed and does not open to auditory or tactile stimulation.  Cranial Nerves:  II: PERRL.  III, IV, VI: Bilateral left gaze preference. Disconjugate. Left eye down and abducted. No doll's eye noted.  V: Sensation is relatively intact. Appears to respond to noxious stimuli.  VII: Flaccidly symmetric.  VIII: Unable to assess hearing due to encephalopathy. IX, X: Unable to assess due to ongoing encephalopathy. XI: Unable to assess due to ongoing encephalopathy. XII: Unable to assess due to ongoing encephalopathy.   Motor: Appears to move all  extremities to noxious stimuli. Unable to   Tone is normal. Bulk is normal.  Sensation: Unable to assess due to ongoing encephalopathy. But does appear to respond to noxious stimuli. Coordination: Unable to assess due to ongoing encephalopathy.Marland Kitchen  DTRs: Was unable to elicit patellar or triceps reflexes. Bilateral upgoing toes for Babinski Gait: Deferred  NIHSS: 1a Level of Conscious.: 2 1b LOC Questions: 2 1c LOC Commands: 2 2 Best Gaze: 2 3 Visual: 0 4 Facial Palsy: 0 5a Motor Arm - left: 2 5b Motor Arm - Right:  2 6a Motor Leg - Left: 2 6b Motor Leg - Right: 2 7 Limb Ataxia: 0 8 Sensory: 2 9 Best Language: 3 10 Dysarthria: 0 11 Extinct. and Inatten.: 0 TOTAL: 21   Labs I have reviewed labs in epic and the results pertinent to this consultation are:  CBC    Component Value Date/Time   WBC 9.7 09/24/2021 0557   RBC 4.19 (L) 09/24/2021 0557   HGB 11.1 (L) 09/24/2021 0557   HCT 38.1 (L) 09/24/2021 0557   PLT 83 (L) 09/24/2021 0557   MCV 90.9 09/24/2021 0557   MCH 26.5 09/24/2021 0557   MCHC 29.1 (L) 09/24/2021 0557   RDW 15.4 09/24/2021 0557   LYMPHSABS 0.3 (L) 09/24/2021 1230   MONOABS 0.4 08/28/2021 1230   EOSABS 0.0 09/24/2021 1230   BASOSABS 0.0 09/18/2021 1230    CMP     Component Value Date/Time   NA 151 (H) 09/24/2021 0557   K 4.2 09/24/2021 0557   CL 118 (H) 09/24/2021 0557   CO2 26 09/24/2021 0557   GLUCOSE 287 (H) 09/24/2021 0557   BUN 57 (H) 09/24/2021 0557   CREATININE 1.17 09/24/2021 0557   CALCIUM 10.3 09/24/2021 0557   CALCIUM 8.7 10/01/2018 0228   PROT 5.7 (L) 09/24/2021 0557   ALBUMIN 2.2 (L) 09/24/2021 0557   AST 20 09/24/2021 0557   ALT 21 09/24/2021 0557   ALKPHOS 60 09/24/2021 0557   BILITOT 0.4 09/24/2021 0557   GFRNONAA >60 09/24/2021 0557   GFRAA >60 10/04/2019 0605    Lipid Panel  No results found for: CHOL, TRIG, HDL, CHOLHDL, VLDL, LDLCALC, LDLDIRECT   Imaging I have reviewed the images obtained:  CT Head: Right sided  acute/subacute infarct of SCA.   CTA head/neck: R cerebellar hemisphere   MRI examination of the brain  Assessment: Atzel Mccambridge is a 76 y.o. male with chronic afib not on AC d/t GI bleed, CAD, colon cancer, T2DM, HTN, cirrhosis, morbid obesity with chronic indwelling catheter presenting to ED from nursing facility due to altered mental status.  Recommendations: Plan:  - Frequent Neuro checks per stroke unit protocol - Recommend brain imaging with MRI Brain without contrast - TTE with EF of 55-60%, no PFO. Aortic valve thickening with mild to moderate regurgitation. - Recommend TEE. - Recommend obtaining Lipid panel with LDL - Please start statin if LDL > 70 - Recommend HbA1c - Antithrombotic - no antiplatelet due to concern for bacterial endocarditis. - Recommend DVT ppx - SBP goal - gradual normotension. - Recommend Telemetry monitoring for arrythmia - Recommend bedside swallow screen prior to PO intake. - Stroke education booklet - Recommend PT/OT/SLP consult  France Ravens, MD PGY1 Resident   NEUROHOSPITALIST ADDENDUM Performed a face to face diagnostic evaluation.   I have reviewed the contents of history and physical exam as documented by PA/ARNP/Resident and agree with above documentation.  I have discussed and formulated the above plan as documented. Edits to the note have been made as needed.  Impression/Key exam findings/Plan: 76 y/o M with afibb not on AC p/w AMS and found to have COVID 19 PNA + MRSA bacteremia and complicated by his cirrhosis. 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 On exam, he is obtunded, he will partially open his eyes to vigorous tactile stimulation. He still seems to be protecting his airway.He has a L facial droop + L gaze preference and seems to be moving his LUE more so than his RUE. NIHSS of 25 as above.  High suspicion for bacterial endocarditis and would therefore hold off on Antiplatelet and Anticoagulation at this time. Personally  reviewed the CTA with no basilar thrombosis. Agree with getting TEE.  In addition to above, would also recommend being cautious with hypotonic fluids. Strokes typically cause mass effect and hyponatremia can worsen mass effect. This is specially important since his stroke is in the posterior fossa.I would recommend keeping his sodium between 145-155 for now and he is spontaneously there.  Will also get a routine EEG to evaluate for any epileptogenic abnormalities or seizures given profound AMS and focal deficits on exam.  This patient is critically ill and at significant risk of neurological worsening, death and care requires constant monitoring of vital signs, hemodynamics,respiratory and cardiac monitoring, neurological assessment, discussion with family, other specialists and medical decision making of high complexity. I spent 40 minutes of neurocritical care time  in the care of  this patient. This was time spent independent of any time provided by nurse practitioner or PA.  Donnetta Simpers [facility] Pager Number [phone] 09/24/2021  5:00 PM   Donnetta Simpers, MD [facility] [phone]   If 7pm to 7am, please call on call as listed on AMION.

## 2021-09-24 NOTE — Significant Event (Addendum)
Rapid Response Event Note   Reason for Call :  MEWS- decreased LOC, tachypnea, tachycardia  Initial Focused Assessment:  Pt lying in bed, eyes closed. He moans verbal response to voice. He has a weak congested cough, put unable to independently clear secretions. Yankeur used to remove thick tan mucous. Gag reflex present. Pt breathing is unlabored. Breath sounds are rhonchus throughout, improved with pt coughing. Skin is warm, dry/flaky.   Pt is noted to have left gaze preference and unable to cross midline. PERRLA, 40mm. Left facial droop. Pt withdrawals all extremities to pain, although right side gross movement is less than left. Pt just returned from CT scan with new findings, neurology was consulted and further orders written.   VS: T 98.44F, BP 133/80, HR 98, RR 23, SpO2 94% on 2LNC  Interventions:  -Oral care -Turn and reposition  Plan of Care:  -q2h turns -oral care per protocol -humidify oxygen via nasal cannula if pt needs greater than [facility]  Event Summary:  Non-acute changes. RN requesting second set of eyes. Call Time: 1024 Arrival Time: 3329 End Time: [place], RN

## 2021-09-25 ENCOUNTER — Inpatient Hospital Stay (HOSPITAL_COMMUNITY): Payer: Medicare Other

## 2021-09-25 DIAGNOSIS — B9562 Methicillin resistant Staphylococcus aureus infection as the cause of diseases classified elsewhere: Secondary | ICD-10-CM | POA: Diagnosis not present

## 2021-09-25 DIAGNOSIS — Z66 Do not resuscitate: Secondary | ICD-10-CM

## 2021-09-25 DIAGNOSIS — Z7189 Other specified counseling: Secondary | ICD-10-CM

## 2021-09-25 DIAGNOSIS — R7881 Bacteremia: Secondary | ICD-10-CM | POA: Diagnosis not present

## 2021-09-25 DIAGNOSIS — R0989 Other specified symptoms and signs involving the circulatory and respiratory systems: Secondary | ICD-10-CM

## 2021-09-25 DIAGNOSIS — N3000 Acute cystitis without hematuria: Secondary | ICD-10-CM

## 2021-09-25 DIAGNOSIS — R638 Other symptoms and signs concerning food and fluid intake: Secondary | ICD-10-CM

## 2021-09-25 DIAGNOSIS — Z789 Other specified health status: Secondary | ICD-10-CM

## 2021-09-25 DIAGNOSIS — Z515 Encounter for palliative care: Secondary | ICD-10-CM

## 2021-09-25 LAB — CBC
HCT: 36.9 % — ABNORMAL LOW (ref 39.0–52.0)
Hemoglobin: 10.8 g/dL — ABNORMAL LOW (ref 13.0–17.0)
MCH: 26.6 pg (ref 26.0–34.0)
MCHC: 29.3 g/dL — ABNORMAL LOW (ref 30.0–36.0)
MCV: 90.9 fL (ref 80.0–100.0)
Platelets: 82 10*3/uL — ABNORMAL LOW (ref 150–400)
RBC: 4.06 MIL/uL — ABNORMAL LOW (ref 4.22–5.81)
RDW: 15.5 % (ref 11.5–15.5)
WBC: 10.6 10*3/uL — ABNORMAL HIGH (ref 4.0–10.5)
nRBC: 0.7 % — ABNORMAL HIGH (ref 0.0–0.2)

## 2021-09-25 LAB — BASIC METABOLIC PANEL
Anion gap: 8 (ref 5–15)
Anion gap: 9 (ref 5–15)
BUN: 47 mg/dL — ABNORMAL HIGH (ref 8–23)
BUN: 48 mg/dL — ABNORMAL HIGH (ref 8–23)
CO2: 24 mmol/L (ref 22–32)
CO2: 26 mmol/L (ref 22–32)
Calcium: 9.9 mg/dL (ref 8.9–10.3)
Calcium: 9.9 mg/dL (ref 8.9–10.3)
Chloride: 122 mmol/L — ABNORMAL HIGH (ref 98–111)
Chloride: 121 mmol/L — ABNORMAL HIGH (ref 98–111)
Creatinine, Ser: 1.16 mg/dL (ref 0.61–1.24)
Creatinine, Ser: 1.15 mg/dL (ref 0.61–1.24)
GFR, Estimated: >60 mL/min (ref >60)
GFR, Estimated: >60 mL/min (ref >60)
Glucose, Bld: 199 mg/dL — ABNORMAL HIGH (ref 70–99)
Glucose, Bld: 168 mg/dL — ABNORMAL HIGH (ref 70–99)
Potassium: 4.2 mmol/L (ref 3.5–5.1)
Potassium: 4.4 mmol/L (ref 3.5–5.1)
Sodium: 154 mmol/L — ABNORMAL HIGH (ref 135–145)
Sodium: 156 mmol/L — ABNORMAL HIGH (ref 135–145)

## 2021-09-25 LAB — GLUCOSE, CAPILLARY
Glucose-Capillary: 192 mg/dL — ABNORMAL HIGH (ref 70–99)
Glucose-Capillary: 176 mg/dL — ABNORMAL HIGH (ref 70–99)
Glucose-Capillary: 137 mg/dL — ABNORMAL HIGH (ref 70–99)

## 2021-09-25 IMAGING — MR MR HEAD WO/W CM
7 of 12 series · 29 of 48 positions shown · IV contrast (gadavist)
Comparison: Head CT and CTA [DATE]

CLINICAL DATA: Stroke follow-up. Facial droop and right-sided
weakness.

EXAM:
MRI HEAD WITHOUT AND WITH CONTRAST
TECHNIQUE: Multiplanar, multiecho pulse sequences of the brain and surrounding
structures were obtained without and with intravenous contrast.
CONTRAST:  10mL GADAVIST GADOBUTROL 1 MMOL/ML IV SOLN

[Series 3: DWI · axial · 3.0mm · 0.94mm/px · z∈[-63,+75]mm · 8 of 100 slices shown (1 of 2)]
[im 1/100]
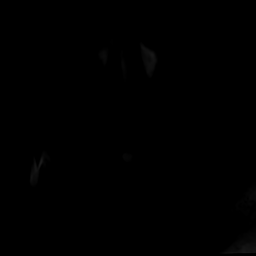
[im 12/100]
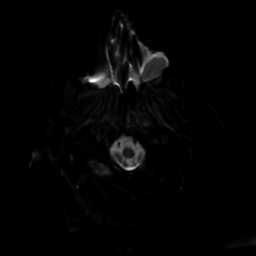
[im 34/100]
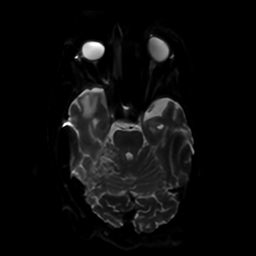
[im 45/100]
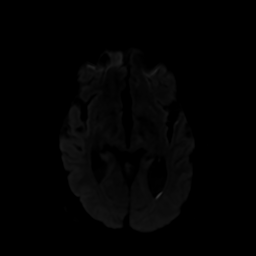
[im 56/100]
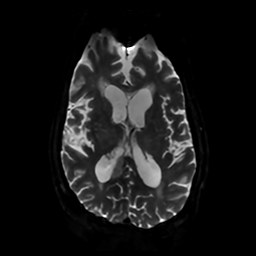
[im 67/100]
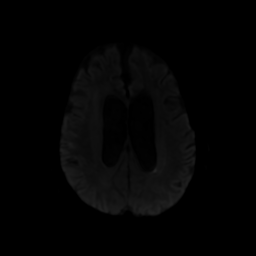
[im 89/100]
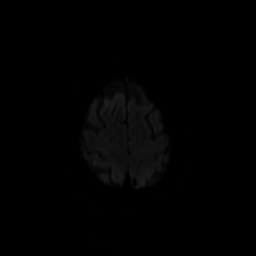
[im 100/100]
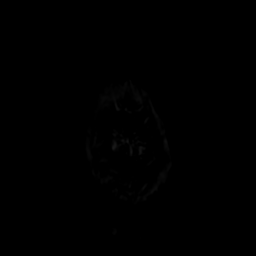

[Series 4: DWI · coronal · 4.0mm · 0.94mm/px · 7 of 72 slices shown (2 of 2)]
[im 1/72]
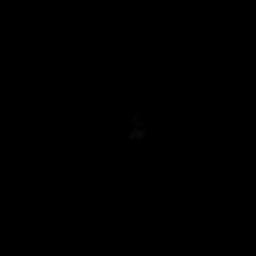
[im 12/72]
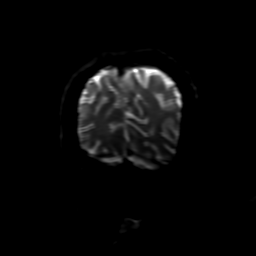
[im 24/72]
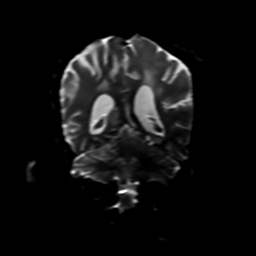
[im 36/72]
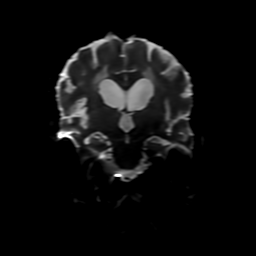
[im 48/72]
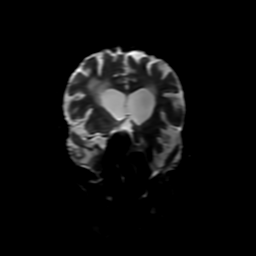
[im 60/72]
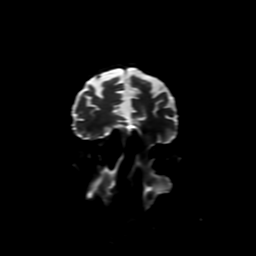
[im 72/72]
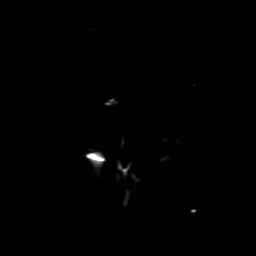

[Series 5: FLAIR · sagittal · 5.0mm · 0.47mm/px · 2 of 23 slices shown (1 of 2)]
[im 1/23]
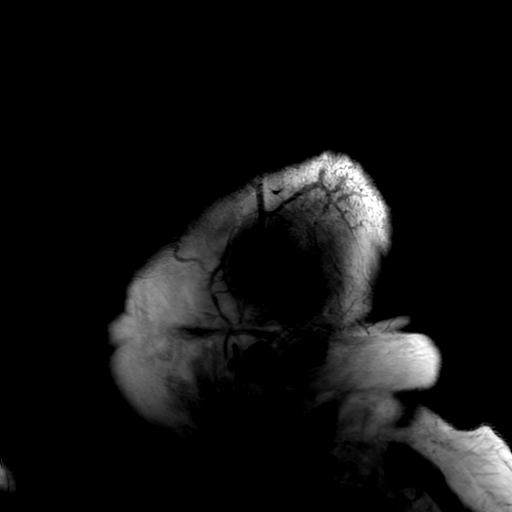
[im 23/23]
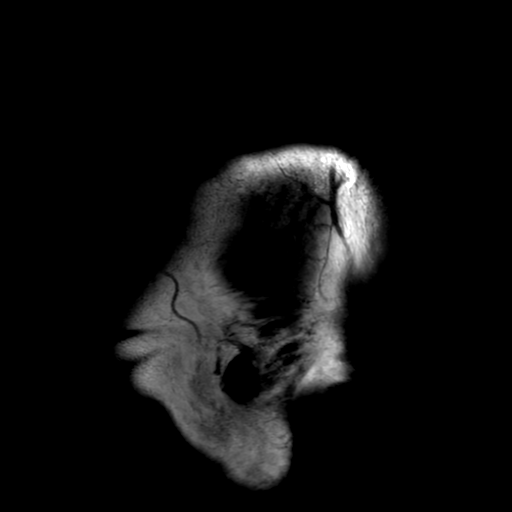

[Series 6: T2 · axial · 5.0mm · 0.23mm/px · 1 of 26 slices shown]
[im 1/26]
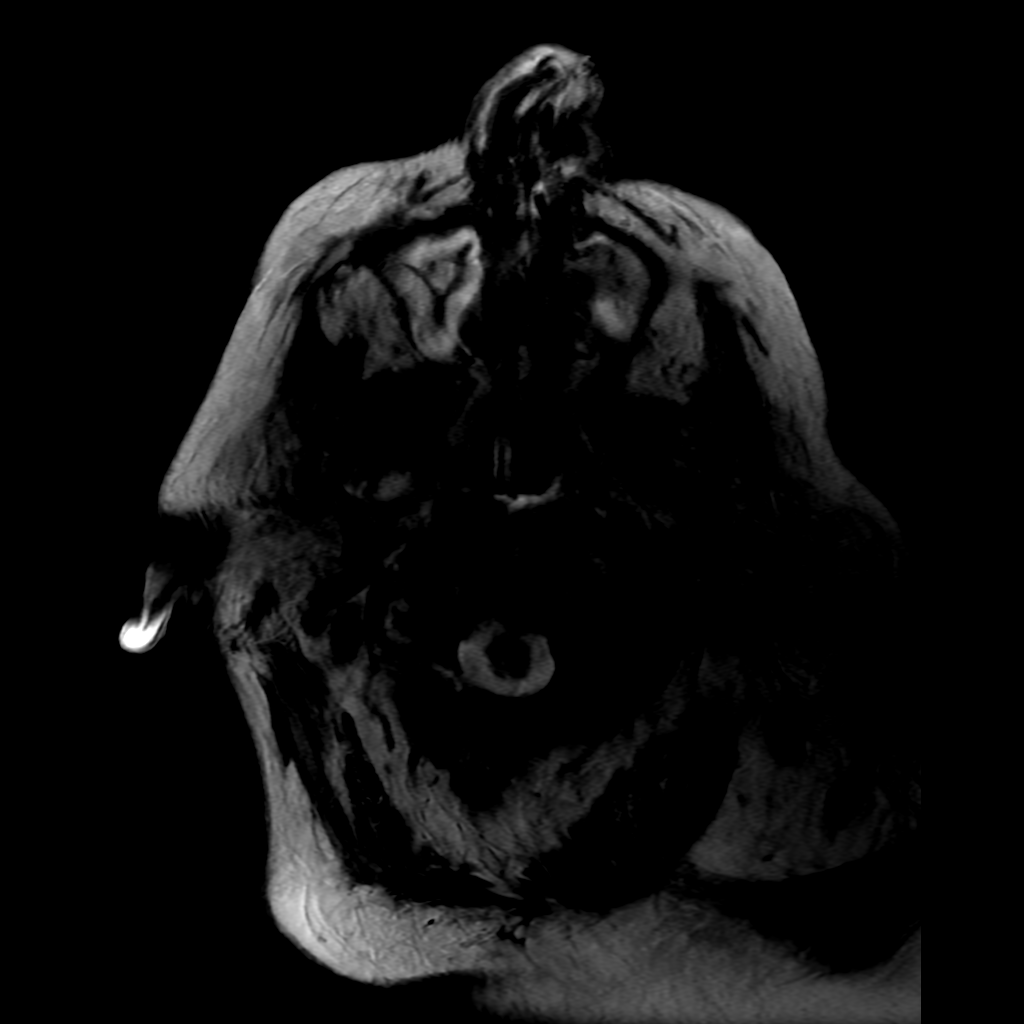

[Series 7: FLAIR · axial · 4.0mm · 0.45mm/px · z∈[-52,+78]mm · 3 of 33 slices shown (2 of 2)]
[im 1/33]
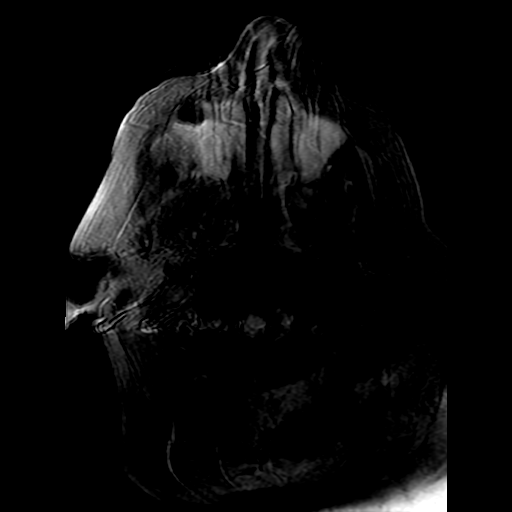
[im 17/33]
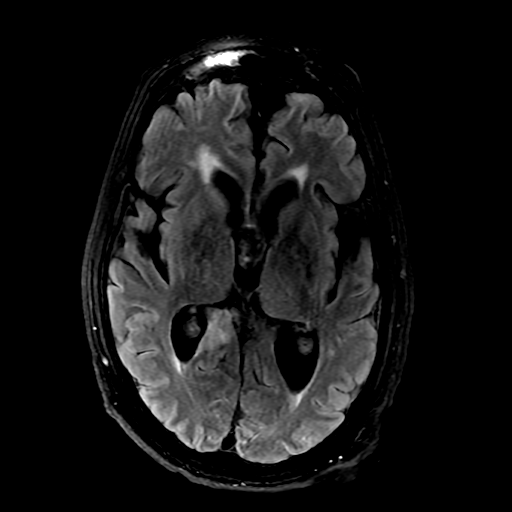
[im 33/33]
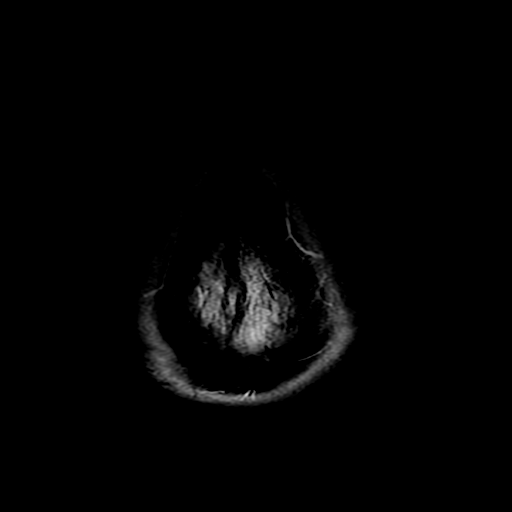

[Series 350: ADC · axial · 3.0mm · 0.94mm/px · z∈[-63,+75]mm · 5 of 50 slices shown (1 of 2)]
[im 1/50]
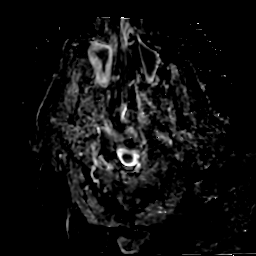
[im 13/50]
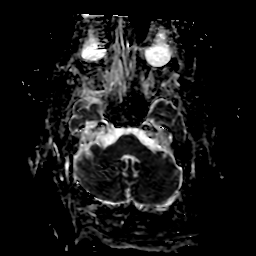
[im 25/50]
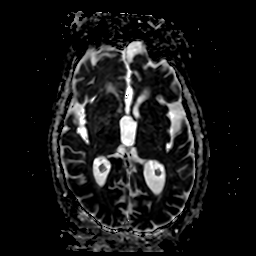
[im 37/50]
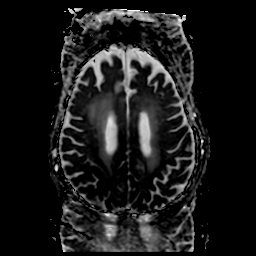
[im 50/50]
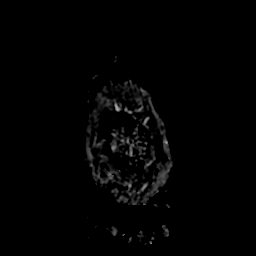

[Series 450: ADC · coronal · 4.0mm · 0.94mm/px · 3 of 36 slices shown (2 of 2)]
[im 1/36]
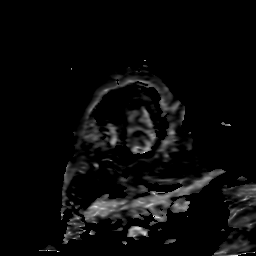
[im 18/36]
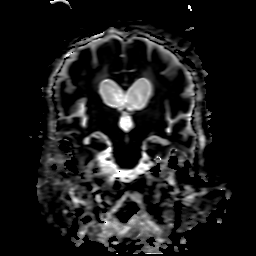
[im 36/36]
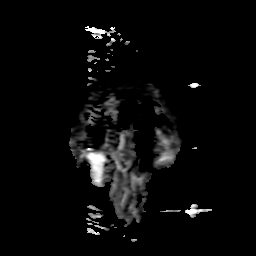

[29 of 48 positions shown; findings below may reference images not displayed]

FINDINGS: Multiple sequences are mildly to moderately motion degraded.

Brain: Acute infarcts measure 3.5 cm in the right cerebellar
hemisphere (SCA territory) and 2.5 cm in the splenium of the corpus
callosum on the right. Additional smaller acute infarcts measuring 1
cm or less are noted in the left parietal and left occipital
periventricular white matter and bilateral posterior frontal cortex.
There is a punctate acute infarct versus artifact in the midline of
the ventral midbrain. There is petechial hemorrhage associated with
the right cerebellar and possibly right frontal infarcts.

Patchy T2 hyperintensities in the cerebral white matter bilaterally
are nonspecific but compatible with moderate chronic small vessel
ischemic disease. There is mild cerebral atrophy. Encephalomalacia
is noted anteriorly in the right temporal lobe and could be
posttraumatic or ischemic. No definite abnormal enhancement is
identified within limitations of motion artifact. No mass, midline
shift, or extra-axial fluid collection is evident.

Vascular: Major intracranial vascular flow voids are preserved.

Skull and upper cervical spine: Unremarkable bone marrow signal.

Sinuses/Orbits: Bilateral cataract extraction. Mucosal thickening
and fluid in the paranasal sinuses including persistent complete
opacification of the left maxillary sinus. Small bilateral mastoid
effusions.

Other: None.
IMPRESSION: 1. Acute infarcts in the right cerebellum and both cerebral
hemispheres.
2. Moderate chronic small vessel ischemic disease.

## 2021-09-25 MED ORDER — LORAZEPAM 2 MG/ML IJ SOLN: 1.0000 mg | INTRAMUSCULAR | Status: DC | PRN

## 2021-09-25 MED ORDER — ACETAMINOPHEN 325 MG PO TABS: 650.0000 mg | ORAL_TABLET | Freq: Four times a day (QID) | ORAL | Status: DC | PRN

## 2021-09-25 MED ORDER — GLYCOPYRROLATE 1 MG PO TABS
1.0000 mg | ORAL_TABLET | ORAL | Status: DC | PRN
  Filled 2021-09-25: qty 1

## 2021-09-25 MED ORDER — GADOBUTROL 1 MMOL/ML IV SOLN
10.0000 mL | Freq: Once | INTRAVENOUS | Status: AC | PRN
  Administered 2021-09-25: 10 mL via INTRAVENOUS

## 2021-09-25 MED ORDER — LORAZEPAM 1 MG PO TABS: 1.0000 mg | ORAL_TABLET | ORAL | Status: DC | PRN

## 2021-09-25 MED ORDER — GLYCOPYRROLATE 0.2 MG/ML IJ SOLN
0.2000 mg | INTRAMUSCULAR | Status: DC | PRN
  Filled 2021-09-25: qty 1

## 2021-09-25 MED ORDER — MORPHINE SULFATE (PF) 2 MG/ML IV SOLN
2.0000 mg | INTRAVENOUS | Status: DC | PRN
  Administered 2021-09-25 – 2021-09-26 (×3): 2 mg via INTRAVENOUS
  Filled 2021-09-25 (×3): qty 1

## 2021-09-25 MED ORDER — BIOTENE DRY MOUTH MT LIQD
15.0000 mL | Freq: Two times a day (BID) | OROMUCOSAL | Status: DC
  Administered 2021-09-25 – 2021-09-26 (×2): 15 mL via TOPICAL

## 2021-09-25 MED ORDER — ACETAMINOPHEN 650 MG RE SUPP: 650.0000 mg | Freq: Four times a day (QID) | RECTAL | Status: DC | PRN

## 2021-09-25 MED ORDER — HALOPERIDOL LACTATE 5 MG/ML IJ SOLN: 0.5000 mg | INTRAMUSCULAR | Status: DC | PRN

## 2021-09-25 MED ORDER — LORAZEPAM 2 MG/ML PO CONC: 1.0000 mg | ORAL | Status: DC | PRN

## 2021-09-25 MED ORDER — GLYCOPYRROLATE 0.2 MG/ML IJ SOLN
0.2000 mg | Freq: Once | INTRAMUSCULAR | Status: AC
  Administered 2021-09-25: 0.2 mg via INTRAVENOUS
  Filled 2021-09-25 (×2): qty 1

## 2021-09-25 MED ORDER — HALOPERIDOL LACTATE 2 MG/ML PO CONC
0.5000 mg | ORAL | Status: DC | PRN
  Filled 2021-09-25: qty 0.3

## 2021-09-25 MED ORDER — ONDANSETRON HCL 4 MG/2ML IJ SOLN: 4.0000 mg | Freq: Four times a day (QID) | INTRAMUSCULAR | Status: DC | PRN

## 2021-09-25 MED ORDER — HALOPERIDOL 0.5 MG PO TABS
0.5000 mg | ORAL_TABLET | ORAL | Status: DC | PRN
  Filled 2021-09-25: qty 1

## 2021-09-25 MED ORDER — ONDANSETRON 4 MG PO TBDP: 4.0000 mg | ORAL_TABLET | Freq: Four times a day (QID) | ORAL | Status: DC | PRN

## 2021-09-25 MED ORDER — POLYVINYL ALCOHOL 1.4 % OP SOLN
1.0000 [drp] | Freq: Four times a day (QID) | OPHTHALMIC | Status: DC | PRN
  Filled 2021-09-25: qty 15

## 2021-09-25 NOTE — Progress Notes (Incomplete)
Subjective: Overnight: Nursing staff unable to given lactulose enemas d/t difficulty, favoring ng tube for lactulose admins. 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 Discussed with Dr Havery Moros with [facility] risk vs benefits of bedside NG placement. Confirmed this is low risk and would benefit from NG placement for hepatic encephalopathy. NG tube place and lactulose per tube started.   Patient was seen at bedside during rounds today. He continues to be encephalopathic and is unable to engage in conversation. He has new neurological deficits as noted below under exam.   He is moaning occasionally and appears to have no trouble with breathing on supplemental O2.   Objective:  Vital signs in last 24 hours: Vitals:   09/24/21 1300 09/24/21 2015 09/24/21 2315 09/25/21 0515  BP: 101/70 103/60 128/77 122/60  Pulse: 98 87 96 98  Resp: (!) 22 (!) 21 (!) 23 (!) 22  Temp:  97.8 F (36.6 C) 97.9 F (36.6 C) 99.2 F (37.3 C)  TempSrc:  Axillary Axillary Axillary  SpO2: 96% 98% 98% 96%  Weight:      Height:       Constitutional: chronically ill appearing, in NAD HENT: Mouth dry, tongue scabby, blepharitis Eyes: conjunctiva non-erythematous, EOMI Neck: Neck veins flat  Cardiovascular: irregular rhythm and tachycardic, systolic murmur, non-edematous bilateral LE Pulmonary/Chest: normal work of breathing on 3L supplemental O2. Weak cough with congestion. LCTAB.  Abdominal: soft, non-tender to palpation, non-distended MSK: normal bulk and tone Neurological: ongoing encephalopathy, new left sided facial droop and favoring right upper extremities. He is able to move his left arm but not as frequently as right. No obvious rigidity of extremities. Stable left gaze preference with intact pupillary reflexes. Arouses to pain but not voice, which is unchanged compared to prior.  Skin: warm and dry, heels with good cap refills. Improved erythema of lower extremities with some desqumation. Rash of  arms and legs improved. Tinea of bilateral feet, chemosis bilaterally, plaques of feet.   Assessment/Plan:  Principal Problem:   MRSA bacteremia Active Problems:   DM (diabetes mellitus), type 2 ([place])   Cirrhosis of liver ([place])   Pressure injury of skin   Encephalopathy, hepatic   Encephalopathy   Acute COVID-19   Altered mental status   Cerebrovascular accident (CVA) ([place])  Acute metabolic encephalopathy in setting of MRSA bacteremia, COVID infection and hx of cirrhosis New focal neurological deficits  Afebrile and HDS today with stable. Ongoing encephalopathy thought to be due to MRSA bacteremia (on Vanc) and possibly COVID infection, however concerning given no improvement with 4 days of tx with abx and decadron. Repeat BC with NGTD. Was concerned that this may be hepatic encephalopathy given nursing staff unable to admin enemas; NGT placed yesterday and has been receiving lactulose since last night. New focal neurological deficits this morning with right sided facial droop and favoring right extremities. 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 MRI brain ordered (currently pending) and neurology consulted. 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 CTA head and neck showing patent right superior cerebellar and basilar artery; mild to mod stenosis distal right vertebral artery at the level of PICA origin with right PICA not visualized. 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 Given overall no improvement since admission with decline in mentation and new  neurological deficits, PCCM consulted; PCCM spoke with neurology, who agrees that this pt does not require ICU level care and PPCM in agreement given no increasing O2 requirement and pt's DNR/DNI status  limited intervention/options they can provide.   Extensive conversation with son proving him updates, including anticipated poor outcomes, and son is appreciative; continue DNR/DNI.  - Neurology following, appreciate assistance; holding off on Antiplatelet and Anticoagulation at this time given concern for bacterial endocarditis/septic emboli. EEG ordered and pending.  - ID following, appreciate assistance  - PCCM signed off   - Will need TEE once stable  - Continue Vancomycin  - Continue IV Decadron 6 mg daily, day 5 - Continue Remdesivir  - D5W 150 cc/hr (see below)  - Follow up on repeat blood cultures  - Follow up on repeat urine culture   - Lactulose per NG tube - SLP consulted for swallow screen  Hypernatremia Na 151 after D5W. Spoke with neuro, who recommends maintaining Na between 145-155 and to avoid abrupt decline in Na to avoid worsening mass effect from stroke, recommending normal saline and D5 as needed if Na goes above our goal of 145-155. Will D/C D5W, start normal saline and monitor q6h BMPs.   -Discontinued D5W -Started normal saline at 75 cc/hr continuous   -Start D5 as needed if Na is above goal of 145-155 -BMP q6h for Na monitoring   T2DM A1c 6.3 on 8/20. CBGs elevated 250's today in the setting of decadron use.  - Holding metformin  - increased to moderate SSI - CBG monitoring    Chronic A. fib Rates 90-100 in the setting of infection.  - Resume home Coreg once pressures allow   - Patient is not on long-term anticoagulation due to prior history of GI bleed and liver cirrhosis.    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  -Continue Lasix and spironolactone after passing bedside swallow screen  -Continue with maintenance fluids as above    HLD -Continue pravastatin after passing bedside swallow screen    Rosezetta Schlatter, MD  Internal Medicine/Psych Resident, PGY-1 [facility]  Pager: [phone]-

## 2021-09-25 NOTE — Consult Note (Signed)
Consultation Note Date: 09/25/2021   Patient Name: Brandon [name]  DOB: [birth year]  MRN: [id number]  Age / Sex: 76 y.o., male  PCP: Helane Rima, MD Referring Physician: Angelica Pou, MD  Reason for Consultation: Establishing goals of care, "transitioning to home hospice per family request. Please help with transition."  HPI/Patient Profile: 76 y.o. male  with past medical history of coronary artery disease, colon cancer, T2DM, cirrhosis, A-fib, portal hypertension, morbid obesity with chronic indwelling catheter presented to ED on 09/12/2021 from [facility] [place]. Per son patient recently tested positive for COVID at facility. Patient was admitted on 09/11/2021 with acute metabolic encephalopathy likely secondary from UTI vs COVID infection and cool left upper extremity. Patient's hospital course is now complicated by MSRA bacteremia and worsening encephalopathy in the setting of acute stroke. CCM states likelihood of patient surviving illness is low and if he did survive his chance at meaningful functional recovery is also low. Neurology states patient is not a candidate for CVA intervention.   Clinical Assessment and Goals of Care: I have reviewed medical records including EPIC notes, labs, and imaging. Received report from primary RN - no acute concerns. RN reports seeing patient decline since admission - he is lethargic today and has not been eating/drinking for 3 days.    Went to visit patient at bedside - no family/visitors present. Patient was lying in bed asleep - I did not attempt to wake him. No signs or non-verbal gestures of pain or discomfort noted. No respiratory distress or increased work of breathing; secretions noted.   Met with patient's son via phone  to discuss diagnosis, prognosis, GOC, EOL wishes, disposition, and options.  I introduced Palliative Medicine as specialized medical  care for people living with serious illness. It focuses on providing relief from the symptoms and stress of a serious illness. The goal is to improve quality of life for both the patient and the family.  We discussed a brief life review of the patient as well as functional and nutritional status. Patient's wife has passed away - they had one son/Brandon. Prior to hospitalization, patient was living at [facility] - he has been there since about July 2022. At baseline, patient is bedbound and with assistance could transfer to wheelchair. [name] has seen an incremental decline in patient over the last 2-3 years.   We discussed patient's current illness and what it means in the larger context of patient's on-going co-morbidities. [name] has a clear understanding of patient's current acute medical situation. Natural disease trajectory and expectations at EOL were discussed. I attempted to elicit values and goals of care important to the patient. The difference between aggressive medical intervention and comfort care was considered in light of the patient's goals of care.   [name] is clear in his desire to pursue hospice care for patient - requesting [facility] [place].   We talked about transition to comfort measures in house and what that would entail inclusive of medications to control  pain, dyspnea, agitation, nausea, and itching. We discussed stopping all unnecessary measures such as blood draws, needle sticks, oxygen, antibiotics, CBGs/insulin, cardiac monitoring, IVF, and frequent vital signs. [name] is agreeable for patient transition to full comfort care today.   Current [facility] EOL visitation policy was reviewed.  Visit also consisted of discussions dealing with the complex and emotionally intense issues of symptom management and palliative care in the setting of serious and life-threatening illness.   Discussed with [name] the importance of continued  conversation with family and the medical providers regarding overall plan of care and treatment options, ensuring decisions are within the context of the patients values and GOCs.    Questions and concerns were addressed. The patient/family was encouraged to call with questions and/or concerns. PMT number was provided.  Primary Decision Maker: NEXT OF KIN - Brandon [name]/son    SUMMARY OF RECOMMENDATIONS   Initiated full comfort measures Continue DNR/DNI as previously documented - Copy of durable DNR was made and will be scanned into Vynca/ACP tab. Original left in shadow chart Transfer to [facility] location when bed available - TOC and hospice liaison notified. TOC consult placed Added orders for EOL symptom management and to reflect full comfort measures, as well as discontinued orders that were not focused on comfort Unrestricted visitation orders were placed per current [place] EOL visitation policy  Nursing to provide frequent assessments and administer PRN medications as clinically necessary to ensure EOL comfort PMT will continue to follow and support holistically   Symptom Management Robinul IV once now and PRN for respiratory secretions Morphine PRN pain/dyspnea/distress/RR>25 Tylenol PRN pain/fever Biotene BID mouth care Haldol PRN agitation/delirium Ativan PRN anxiety/seizure/distress Zofran PRN nausea/vomiting Liquifilm tears PRN dry eye  Code Status/Advance Care Planning: DNR  Palliative Prophylaxis:  Aspiration, Bowel Regimen, Delirium Protocol, Frequent Pain Assessment, Oral Care, and Turn Reposition  Additional Recommendations (Limitations, Scope, Preferences): Full Comfort Care  Psycho-social/Spiritual:  Created space and opportunity for patient and family to express thoughts and feelings regarding patient's current medical situation.  Emotional support and therapeutic listening provided.  Prognosis:  < 2  weeks  Discharge Planning: Hospice facility      Primary Diagnoses: Present on Admission:  Encephalopathy  MRSA bacteremia  Cirrhosis of liver ([place])  Pressure injury of skin   I have reviewed the medical record, interviewed the patient and family, and examined the patient. The following aspects are pertinent.  Past Medical History:  Diagnosis Date   Anemia 07/2019   Hb 6.7 >> 3 PRBCs >> 8.8 in 07/2019.      Atrial fibrillation ([place])    Cholelithiasis    Cirrhosis of liver ([place])    Presumed due to EtOH as well as fatty liver from morbid obesity.  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   Obesity 07/2019   Portal hypertension ([place])    Tubular adenoma of colon    Social History   Socioeconomic History   Marital status: Widowed    Spouse name: Not on file   Number of children: Not on file   Years of education: Not on file   Highest education  level: Not on file  Occupational History   Not on file  Tobacco Use   Smoking status: Former    Types: Cigarettes    Quit date: 1990    Years since quitting: 33.1   Smokeless tobacco: Never  Vaping Use   Vaping Use: Never used  Substance and Sexual Activity   Alcohol use: Not Currently    Comment: 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   Drug use: Never   Sexual activity: Not Currently  Other Topics Concern   Not on file  Social History Narrative   Not on file   Social Determinants of Health   Financial Resource Strain: Not on file  Food Insecurity: Not on file  Transportation Needs: Not on file  Physical Activity: Not on file  Stress: Not on file  Social Connections: Not on file   Family History  Problem Relation  Age of Onset   Colon cancer Father    CAD Other    Colon cancer Paternal Uncle    Colon cancer Paternal Uncle    Diabetes Neg Hx    Scheduled Meds:  chlorhexidine  15 mL Mouth Rinse BID   Chlorhexidine Gluconate Cloth  6 each Topical Daily   dexamethasone (DECADRON) injection  6 mg Intravenous Q24H   enoxaparin (LOVENOX) injection  60 mg Subcutaneous Q24H   insulin aspart  0-15 Units Subcutaneous Q4H   lactulose  20 g Per Tube 6 X Daily   mouth rinse  15 mL Mouth Rinse q12n4p   Continuous Infusions:  sodium chloride 75 mL/hr at 09/24/21 1830   remdesivir 100 mg in NS 100 mL 100 mg (09/25/21 1207)   vancomycin 1,250 mg (09/25/21 1351)   PRN Meds:.acetaminophen Medications Prior to Admission:  Prior to Admission medications   Medication Sig Start Date End Date Taking? Authorizing Provider  acetaminophen (TYLENOL) 500 MG tablet Take 1,000-1,500 mg by mouth every 6 (six) hours as needed for moderate pain.   Yes [provider]  Amino Acids-Protein Hydrolys (FEEDING SUPPLEMENT, PRO-STAT SUGAR FREE 64,) LIQD Take 30 mLs by mouth 3 (three) times daily with meals.   Yes [provider]  carvedilol (COREG) 25 MG tablet Take 25 mg by mouth 2 (two) times daily with a meal.   Yes [provider]  enoxaparin (LOVENOX) 30 MG/0.3ML injection Inject 30 mg into the skin daily. This is taken in addition to the 43m dose, for a total of 773mper day   Yes [provider]  enoxaparin (LOVENOX) 40 MG/0.4ML injection Inject 40 mg into the skin daily. This is taken in addition to the 3097mose, for a total of 52m16mr day   Yes [provider]  finasteride (PROSCAR) 5 MG tablet Take 1 tablet (5 mg total) by mouth daily. 06/05/20  Yes Dagar, AnjaMeredith Staggers  furosemide (LASIX) 20 MG tablet Take 20 mg by mouth 2 (two) times daily. 12/26/20  Yes [provider]  gabapentin (NEURONTIN) 300 MG capsule Take 1 capsule (300 mg total) by mouth 3 (three) times daily.  03/19/21  Yes KrisBonnielee Haff  hydrOXYzine (VISTARIL) 25 MG capsule Take 25 mg by mouth 3 (three) times daily as needed for itching.   Yes [provider]  lactulose (CHRONULAC) 10 GM/15ML solution Take 30 mLs (20 g total) by mouth daily. 03/20/21  Yes KrisBonnielee Haff  metFORMIN (GLUCOPHAGE-XR) 500 MG 24 hr tablet Take 500 mg by mouth daily with breakfast.  02/18/18  Yes [provider]  ondansetron (ZOFRAN) 4 MG tablet Take 1 tablet (4 mg total) by mouth every 6 (six) hours as needed for nausea. 02/06/21  Yes Corinne Ports, PA-C  pantoprazole (PROTONIX) 40 MG tablet Take 1 tablet (40 mg total) by mouth daily. 11/08/19 09/22/2021 Yes Esterwood, Amy S, PA-C  rosuvastatin (CRESTOR) 5 MG tablet Take 5 mg by mouth daily.   Yes [provider]  sertraline (ZOLOFT) 25 MG tablet Take 1 tablet (25 mg total) by mouth daily. 03/24/21  Yes Dessa Phi, DO  spironolactone (ALDACTONE) 50 MG tablet Take 50 mg by mouth daily.   Yes [provider]  tamsulosin (FLOMAX) 0.4 MG CAPS capsule Take 2 capsules (0.8 mg total) by mouth at bedtime. 03/23/21  Yes Dessa Phi, DO  triamcinolone cream (KENALOG) 0.1 % Apply 1 application topically 3 (three) times daily. 07/10/21  Yes [provider]  vitamin C (ASCORBIC ACID) 500 MG tablet Take 500 mg by mouth 2 (two) times daily.   Yes [provider]  zinc sulfate 220 (50 Zn) MG capsule Take 220 mg by mouth daily.   Yes [provider]  pravastatin (PRAVACHOL) 10 MG tablet Take 10 mg by mouth at bedtime. Patient not taking: Reported on 09/12/2021 02/18/18   [provider]   No Known Allergies Review of Systems  Unable to perform ROS: Patient unresponsive   Physical Exam Vitals and nursing note reviewed.  Constitutional:      General: He is not in acute distress.    Appearance: He is ill-appearing.  Pulmonary:     Effort: No respiratory distress.  Chest:     Comments: Respiratory  secretions Skin:    General: Skin is warm and dry.  Neurological:     Mental Status: He is lethargic.     Motor: Weakness present.    Vital Signs: BP 122/60 (BP Location: Right Arm)    Pulse 98    Temp 99.2 F (37.3 C) (Axillary)    Resp (!) 22    Ht 6' (1.829 m)    Wt 122.5 kg    SpO2 96%    BMI 36.62 kg/m  Pain Scale: PAINAD   Pain Score: Asleep   SpO2: SpO2: 96 % O2 Device:SpO2: 96 % O2 Flow Rate: .O2 Flow Rate (L/min): 3 L/min  IO: Intake/output summary:  Intake/Output Summary (Last 24 hours) at 09/25/2021 1427 Last data filed at 09/25/2021 0600 Gross per 24 hour  Intake 2205.95 ml  Output 1980 ml  Net 225.95 ml    LBM: Last BM Date : 09/24/21 Baseline Weight: Weight: 122.5 kg Most recent weight: Weight: 122.5 kg     Palliative Assessment/Data: PPS 10%     Time In: 1430 Time Out: 1545 Time Total: 75 minutes  Greater than 50%  of this time was spent counseling and coordinating care related to the above assessment and plan.  Signed by: Lin Landsman, NP   Please contact Palliative Medicine Team phone at [phone] for questions and concerns.  For individual provider: See Shea Evans

## 2021-09-25 NOTE — TOC Progression Note (Signed)
Transition of Care (TOC) - Progression Note  ? ? ?Patient Details  ?Name: [name] ?MRN: [id number] ?Date of Birth: 13-Nov-1945 ? ?Transition of Care (TOC) CM/SW Contact  ?Joanne Chars, LCSW ?Phone Number: ?09/25/2021, 1:27 PM ? ?Clinical Narrative:   Per Loma Boston at [facility], they can offer pt LTC bed, however, pt does have balance owed [facility] currently.  Teena informed that new palliative consult ordered, [place] discussions will occur and CSW will follow up afterwards.   ? ? ? ?Expected Discharge Plan: [place] ?Barriers to Discharge: Continued Medical Work up ? ?Expected Discharge Plan and Services ?Expected Discharge Plan: [place] ?In-house Referral: Clinical Social Work ?  ?Post Acute Care Choice: Nursing Home ?Living arrangements for the past 2 months: [place] ?                ?  ?  ?  ?  ?  ?  ?  ?  ?  ?  ? ? ?Social Determinants of Health (SDOH) Interventions ?  ? ?Readmission Risk Interventions ?Readmission Risk Prevention Plan 03/21/2021 10/06/2019 10/06/2019  ?Transportation Screening Complete - Complete  ?PCP or Specialist Appt within 3-5 Days Complete Complete -  ?[place] or Home Care Consult Complete - Complete  ?Social Work Consult for [place] Planning/Counseling Complete - Complete  ?Palliative Care Screening Complete - Not Applicable  ?Medication Review Press photographer) Complete - Complete  ?Some recent data might be hidden  ? ? ?

## 2021-09-25 NOTE — Progress Notes (Signed)
[facility] Infectious Diseases Follow Up Note  Patient Identification: Patient Name: Brandon [name] MRN: [id number] [place] Date: 09/24/2021 11:50 AM Age: 76 y.o.Today's Date: 09/25/2021   Reason for Visit: MRSA bacteremia   Principal Problem:   MRSA bacteremia Active Problems:   DM (diabetes mellitus), type 2 ([place])   Cirrhosis of liver ([place])   Pressure injury of skin   Encephalopathy, hepatic   Encephalopathy   Acute COVID-19   Altered mental status   Cerebrovascular accident (CVA) ([place])   Antibiotics:  Cefepime 2/24, ceftriaxone 2/25-2/26 Metronidazole 2/24 Vancomycin 2/24-c  Lines/Hardwares: Distal right femur fracture status post ORIF  Interval Events: Acute/acute infarct in right cerebellar hemisphere in CT head.  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 Fu MRI Brain  TEE on hold currently due to new CVA and encephalopathy  Monitor CBC, BMP and vancomycin trough Poor prognosis  Rest of the management as per the primary team. Thank you for the consult. Please page with pertinent questions or concerns.  ______________________________________________________________________ Subjective patient seen and examined at the bedside. Responsive to noxious stimuli Does not follow commands  Vitals BP 122/60 (BP Location: Right Arm)    Pulse 98     Temp 99.2 F (37.3 C) (Axillary)    Resp (!) 22    Ht 6' (1.829 m)    Wt 122.5 kg    SpO2 96%    BMI 36.62 kg/m     Physical Exam Constitutional: Lying in the bed leaning to the left side    Comments: Left facial droop, responsive to painful stimuli only  Cardiovascular:     Rate and Rhythm: Normal rate and irregular rhythm    Heart sounds:   Pulmonary:     Effort: Pulmonary effort is normal on 3 L nasal cannula    Comments:   Abdominal:     Palpations: Abdomen is soft.     Tenderness: Nondistended and nontender  Musculoskeletal:        General: No swelling or tenderness peripheral joints  Neurological:     General: Left facial droop, unable to assess  Pertinent Microbiology Results for orders placed or performed during the hospital encounter of 08/31/2021  Urine Culture     Status: Abnormal   Collection Time: 09/19/2021 12:21 PM   Specimen: In/Out Cath Urine  Result Value Ref Range Status   Specimen Description IN/OUT CATH URINE  Final   Special Requests   Final    NONE Performed at [facility], [place] [address] [place], [place] [address]    Culture MULTIPLE SPECIES PRESENT, SUGGEST RECOLLECTION (A)  Final   Report Status 09/21/2021 FINAL  Final  Blood Culture (routine x 2)     Status: Abnormal   Collection Time: 09/01/2021 12:30 PM   Specimen: BLOOD RIGHT HAND  Result Value Ref Range Status   Specimen Description BLOOD RIGHT HAND  Final   Special Requests   Final    BOTTLES DRAWN AEROBIC AND ANAEROBIC Blood Culture adequate volume   Culture  Setup Time   Final  GRAM POSITIVE COCCI IN BOTH AEROBIC AND ANAEROBIC BOTTLES CRITICAL RESULT CALLED TO, READ BACK BY AND VERIFIED WITH: PHARMD JAMES LEDFORD 09/21/21@00 :51 BY TW Performed at [facility], [place] [address] [place], [place] [address]    Culture METHICILLIN RESISTANT STAPHYLOCOCCUS AUREUS (A)  Final   Report Status 09/23/2021 FINAL  Final   Organism ID, Bacteria METHICILLIN RESISTANT STAPHYLOCOCCUS  AUREUS  Final      Susceptibility   Methicillin resistant staphylococcus aureus - MIC*    CIPROFLOXACIN 1 SENSITIVE Sensitive     ERYTHROMYCIN >=8 RESISTANT Resistant     GENTAMICIN <=0.5 SENSITIVE Sensitive     OXACILLIN >=4 RESISTANT Resistant     TETRACYCLINE <=1 SENSITIVE Sensitive     VANCOMYCIN <=0.5 SENSITIVE Sensitive     TRIMETH/SULFA <=10 SENSITIVE Sensitive     CLINDAMYCIN <=0.25 SENSITIVE Sensitive     RIFAMPIN <=0.5 SENSITIVE Sensitive     Inducible Clindamycin NEGATIVE Sensitive     * METHICILLIN RESISTANT STAPHYLOCOCCUS AUREUS  Blood Culture (routine x 2)     Status: Abnormal   Collection Time: 09/06/2021 12:30 PM   Specimen: BLOOD RIGHT HAND  Result Value Ref Range Status   Specimen Description BLOOD RIGHT HAND  Final   Special Requests   Final    BOTTLES DRAWN AEROBIC AND ANAEROBIC Blood Culture adequate volume   Culture  Setup Time   Final    GRAM POSITIVE COCCI IN BOTH AEROBIC AND ANAEROBIC BOTTLES CRITICAL VALUE NOTED.  VALUE IS CONSISTENT WITH PREVIOUSLY REPORTED AND CALLED VALUE.    Culture (A)  Final    STAPHYLOCOCCUS AUREUS SUSCEPTIBILITIES PERFORMED ON PREVIOUS CULTURE WITHIN THE LAST 5 DAYS. Performed at [facility], [place] [address] [place], [place] [address]    Report Status 09/23/2021 FINAL  Final  Blood Culture ID Panel (Reflexed)     Status: Abnormal   Collection Time: 09/08/2021 12:30 PM  Result Value Ref Range Status   Enterococcus faecalis NOT DETECTED NOT DETECTED Final   Enterococcus Faecium NOT DETECTED NOT DETECTED Final   Listeria monocytogenes NOT DETECTED NOT DETECTED Final   Staphylococcus species DETECTED (A) NOT DETECTED Final    Comment: CRITICAL RESULT CALLED TO, READ BACK BY AND VERIFIED WITH: PHARMD JAMES LEDFORD 09/21/21@00 :51 BY TW    Staphylococcus aureus (BCID) DETECTED (A) NOT DETECTED Final    Comment: Methicillin (oxacillin)-resistant Staphylococcus aureus (MRSA). MRSA is predictably resistant to beta-lactam  antibiotics (except ceftaroline). Preferred therapy is vancomycin unless clinically contraindicated. Patient requires contact precautions if  hospitalized. CRITICAL RESULT CALLED TO, READ BACK BY AND VERIFIED WITH: PHARMD JAMES LEDFORD 09/21/21@00 :51 BY TW    Staphylococcus epidermidis NOT DETECTED NOT DETECTED Final   Staphylococcus lugdunensis NOT DETECTED NOT DETECTED Final   Streptococcus species NOT DETECTED NOT DETECTED Final   Streptococcus agalactiae NOT DETECTED NOT DETECTED Final   Streptococcus pneumoniae NOT DETECTED NOT DETECTED Final   Streptococcus pyogenes NOT DETECTED NOT DETECTED Final   A.calcoaceticus-baumannii NOT DETECTED NOT DETECTED Final   Bacteroides fragilis NOT DETECTED NOT DETECTED Final   Enterobacterales NOT DETECTED NOT DETECTED Final   Enterobacter cloacae complex NOT DETECTED NOT DETECTED Final   Escherichia coli NOT DETECTED NOT DETECTED Final   Klebsiella aerogenes NOT DETECTED NOT DETECTED Final   Klebsiella oxytoca NOT DETECTED NOT DETECTED Final   Klebsiella pneumoniae NOT DETECTED NOT DETECTED Final   Proteus species NOT DETECTED NOT DETECTED Final   Salmonella species NOT DETECTED NOT DETECTED Final   Serratia marcescens NOT DETECTED NOT  DETECTED Final   Haemophilus influenzae NOT DETECTED NOT DETECTED Final   Neisseria meningitidis NOT DETECTED NOT DETECTED Final   Pseudomonas aeruginosa NOT DETECTED NOT DETECTED Final   Stenotrophomonas maltophilia NOT DETECTED NOT DETECTED Final   Candida albicans NOT DETECTED NOT DETECTED Final   Candida auris NOT DETECTED NOT DETECTED Final   Candida glabrata NOT DETECTED NOT DETECTED Final   Candida krusei NOT DETECTED NOT DETECTED Final   Candida parapsilosis NOT DETECTED NOT DETECTED Final   Candida tropicalis NOT DETECTED NOT DETECTED Final   Cryptococcus neoformans/gattii NOT DETECTED NOT DETECTED Final   Meth resistant mecA/C and MREJ DETECTED (A) NOT DETECTED Final    Comment: CRITICAL RESULT  CALLED TO, READ BACK BY AND VERIFIED WITH: PHARMD JAMES LEDFORD 09/21/21@00 :51 BY TW Performed at [facility], [place] [address], [place], [place] [address]   Resp Panel by RT-PCR (Flu A&B, Covid) Nasopharyngeal Swab     Status: Abnormal   Collection Time: 09/18/2021 12:34 PM   Specimen: Nasopharyngeal Swab; Nasopharyngeal(NP) swabs in vial transport medium  Result Value Ref Range Status   SARS Coronavirus 2 by RT PCR POSITIVE (A) NEGATIVE Final    Comment: (NOTE) SARS-CoV-2 target nucleic acids are DETECTED.  The SARS-CoV-2 RNA is generally detectable in upper respiratory specimens during the acute phase of infection. Positive results are indicative of the presence of the identified virus, but do not rule out bacterial infection or co-infection with other pathogens not detected by the test. Clinical correlation with patient history and other diagnostic information is necessary to determine patient infection status. The expected result is Negative.  Fact Sheet for Patients: EntrepreneurPulse.com.au  Fact Sheet for Healthcare Providers: IncredibleEmployment.be  This test is not yet approved or cleared by the [place] FDA and  has been authorized for detection and/or diagnosis of SARS-CoV-2 by FDA under an Emergency Use Authorization (EUA).  This EUA will remain in effect (meaning this test can be used) for the duration of  the COVID-19 declaration under Section 564(b)(1) of the A ct, 21 U.S.C. section 360bbb-3(b)(1), unless the authorization is terminated or revoked sooner.     Influenza A by PCR NEGATIVE NEGATIVE Final   Influenza B by PCR NEGATIVE NEGATIVE Final    Comment: (NOTE) The Xpert Xpress SARS-CoV-2/FLU/RSV plus assay is intended as an aid in the diagnosis of influenza from Nasopharyngeal swab specimens and should not be used as a sole basis for treatment. Nasal washings and aspirates are unacceptable for Xpert Xpress  SARS-CoV-2/FLU/RSV testing.  Fact Sheet for Patients: EntrepreneurPulse.com.au  Fact Sheet for Healthcare Providers: IncredibleEmployment.be  This test is not yet approved or cleared by the [place] FDA and has been authorized for detection and/or diagnosis of SARS-CoV-2 by FDA under an Emergency Use Authorization (EUA). This EUA will remain in effect (meaning this test can be used) for the duration of the COVID-19 declaration under Section 564(b)(1) of the Act, 21 U.S.C. section 360bbb-3(b)(1), unless the authorization is terminated or revoked.  Performed at [facility], [place] [address], [place], [place] [address]   Urine Culture     Status: Abnormal   Collection Time: 09/21/21  3:28 PM   Specimen: Urine, Bag (ped)  Result Value Ref Range Status   Specimen Description Ur, Bag ped  Final   Special Requests NONE  Final   Culture (A)  Final    <10,000 COLONIES/mL INSIGNIFICANT GROWTH Performed at [facility], [place] [address], [place], [place] [address]  Report Status 09/23/2021 FINAL  Final  Culture, blood (routine x 2)     Status: None (Preliminary result)   Collection Time: 09/22/21  5:54 PM   Specimen: BLOOD RIGHT HAND  Result Value Ref Range Status   Specimen Description BLOOD RIGHT HAND  Final   Special Requests   Final    BOTTLES DRAWN AEROBIC ONLY Blood Culture results may not be optimal due to an inadequate volume of blood received in culture bottles   Culture   Final    NO GROWTH 2 DAYS Performed at [facility], [place] [address], [place], [place] [address]    Report Status PENDING  Incomplete  Culture, blood (routine x 2)     Status: None (Preliminary result)   Collection Time: 09/22/21  5:54 PM   Specimen: BLOOD  Result Value Ref Range Status   Specimen Description BLOOD RIGHT ANTECUBITAL  Final   Special Requests   Final    BOTTLES DRAWN AEROBIC ONLY Blood Culture adequate volume   Culture   Final     NO GROWTH 2 DAYS Performed at [facility], [place] [address], [place], [place] [address]    Report Status PENDING  Incomplete   Pertinent Lab. CBC Latest Ref Rng & Units 09/25/2021 09/24/2021 09/23/2021  WBC 4.0 - 10.5 K/uL 10.6(H) 9.7 7.3  Hemoglobin 13.0 - 17.0 g/dL 10.8(L) 11.1(L) 11.0(L)  Hematocrit 39.0 - 52.0 % 36.9(L) 38.1(L) 36.2(L)  Platelets 150 - 400 K/uL 82(L) 83(L) 68(L)   CMP Latest Ref Rng & Units 09/25/2021 09/24/2021 09/24/2021  Glucose 70 - 99 mg/dL 199(H) 171(H) 256(H)  BUN 8 - 23 mg/dL 47(H) 47(H) 56(H)  Creatinine 0.61 - 1.24 mg/dL 1.16 1.09 1.21  Sodium 135 - 145 mmol/L 154(H) 150(H) 151(H)  Potassium 3.5 - 5.1 mmol/L 4.2 3.9 4.2  Chloride 98 - 111 mmol/L 122(H) 120(H) 118(H)  CO2 22 - 32 mmol/L 24 25 27   Calcium 8.9 - 10.3 mg/dL 9.9 9.9 10.1  Total Protein 6.5 - 8.1 g/dL - - -  Total Bilirubin 0.3 - 1.2 mg/dL - - -  Alkaline Phos 38 - 126 U/L - - -  AST 15 - 41 U/L - - -  ALT 0 - 44 U/L - - -     Pertinent Imaging today Plain films and CT images have been personally visualized and interpreted; radiology reports have been reviewed. Decision making incorporated into the Impression / Recommendations. MR BRAIN W WO CONTRAST  Result Date: 09/25/2021 CLINICAL DATA:  Stroke follow-up. Facial droop and right-sided weakness. EXAM: MRI HEAD WITHOUT AND WITH CONTRAST TECHNIQUE: Multiplanar, multiecho pulse sequences of the brain and surrounding structures were obtained without and with intravenous contrast. CONTRAST:  66mL GADAVIST GADOBUTROL 1 MMOL/ML IV SOLN COMPARISON:  Head CT and CTA 09/24/2021 FINDINGS: Multiple sequences are mildly to moderately motion degraded. Brain: Acute infarcts measure 3.5 cm in the right cerebellar hemisphere (SCA territory) and 2.5 cm in the splenium of the corpus callosum on the right. Additional smaller acute infarcts measuring 1 cm or less are noted in the left parietal and left occipital periventricular white matter and bilateral posterior  frontal cortex. There is a punctate acute infarct versus artifact in the midline of the ventral midbrain. There is petechial hemorrhage associated with the right cerebellar and possibly right frontal infarcts. Patchy T2 hyperintensities in the cerebral white matter bilaterally are nonspecific but compatible with moderate chronic small vessel ischemic disease. There is mild cerebral atrophy. Encephalomalacia is noted anteriorly  in the right temporal lobe and could be posttraumatic or ischemic. No definite abnormal enhancement is identified within limitations of motion artifact. No mass, midline shift, or extra-axial fluid collection is evident. Vascular: Major intracranial vascular flow voids are preserved. Skull and upper cervical spine: Unremarkable bone marrow signal. Sinuses/Orbits: Bilateral cataract extraction. Mucosal thickening and fluid in the paranasal sinuses including persistent complete opacification of the left maxillary sinus. Small bilateral mastoid effusions. Other: None. IMPRESSION: 1. Acute infarcts in the right cerebellum and both cerebral hemispheres. 2. Moderate chronic small vessel ischemic disease. Electronically Signed   By: Logan Bores M.D.   On: 09/25/2021 10:13   EEG adult  Result Date: 09/25/2021 Lora Havens, MD     09/25/2021  8:35 AM Patient Name: Fransico Sciandra MRN: [id number] Epilepsy Attending: Lora Havens Referring Physician/Provider: Donnetta Simpers, MD Date: 09/25/2021 Duration: 27.08 mins Patient history: 76 year old male with altered mental status in the setting of MRSA bacteremia, COVID infection and history of stenosis who had new onset left-sided facial droop and weakness.  EEG evaluate for seizure. Level of alertness: lethargic AEDs during EEG study: None Technical aspects: This EEG study was done with scalp electrodes positioned according to the 10-20 International system of electrode placement. Electrical activity was acquired at a sampling rate of 500Hz  and  reviewed with a high frequency filter of 70Hz  and a low frequency filter of 1Hz . EEG data were recorded continuously and digitally stored. Description: No clear posterior dominant rhythm was seen.  EEG showed continuous generalized predominantly 6 to 7 Hz theta slowing admixed with 2 to 3 Hz delta slowing. Hyperventilation and photic stimulation were not performed.   ABNORMALITY - Continuous slow, generalized IMPRESSION: This study is suggestive of moderate diffuse encephalopathy, nonspecific etiology. No seizures or epileptiform discharges were seen throughout the recording. Lora Havens     I spent more than 35 minutes for this patient encounter including review of prior medical records, coordination of care with primary/other specialist with greater than 50% of time being face to face/counseling and discussing diagnostics/treatment plan with the patient/family.  Electronically signed by:   Rosiland Oz, MD Infectious Disease Physician [facility] for Infectious Disease Pager: [phone]

## 2021-09-25 NOTE — TOC Progression Note (Addendum)
Transition of Care (TOC) - Progression Note  ? ? ?Patient Details  ?Name: Brandon [name] ?MRN: [id number] ?Date of Birth: 11/07/1945 ? ?Transition of Care (TOC) CM/SW Contact  ?Joanne Chars, LCSW ?Phone Number: ?09/25/2021, 3:36 PM ? ?Clinical Narrative:   CSW informed by Luetta Nutting with Palliative Care that son has made decision to move to comfort care and is requesting residential hospice with [facility].  CSW spoke with son [name] by phone, confirmed the above.  Choice of hospice agencies discussed and [name] does want to go with [facility].  CSW spoke with [place] and she will move forward with referral.  No bed availability today, will update tomorrow.   ? ? ? ?Expected Discharge Plan: [place] ?Barriers to Discharge: Continued Medical Work up ? ?Expected Discharge Plan and Services ?Expected Discharge Plan: [place] ?In-house Referral: Clinical Social Work ?  ?Post Acute Care Choice: Nursing Home ?Living arrangements for the past 2 months: [place] ?                ?  ?  ?  ?  ?  ?  ?  ?  ?  ?  ? ? ?Social Determinants of Health (SDOH) Interventions ?  ? ?Readmission Risk Interventions ?Readmission Risk Prevention Plan 03/21/2021 10/06/2019 10/06/2019  ?Transportation Screening Complete - Complete  ?PCP or Specialist Appt within 3-5 Days Complete Complete -  ?[place] or Home Care Consult Complete - Complete  ?Social Work Consult for [place] Planning/Counseling Complete - Complete  ?Palliative Care Screening Complete - Not Applicable  ?Medication Review Press photographer) Complete - Complete  ?Some recent data might be hidden  ? ? ?

## 2021-09-25 NOTE — Evaluation (Signed)
Clinical/Bedside Swallow Evaluation ?Patient Details  ?Name: Brandon [name] ?MRN: [id number] ?Date of Birth: [birth year] ? ?Today's Date: 09/25/2021 ?Time: SLP Start Time (ACUTE ONLY): 4098 SLP Stop Time (ACUTE ONLY): 1191 ?SLP Time Calculation (min) (ACUTE ONLY): 17 min ? ?Past Medical History:  ?Past Medical History:  ?Diagnosis Date  ? Anemia 07/2019  ? Hb 6.7 >> 3 PRBCs >> 8.8 in 07/2019.     ? Atrial fibrillation ([place])   ? Cholelithiasis   ? Cirrhosis of liver ([place])   ? Presumed due to EtOH as well as fatty liver from morbid obesity.  Cirrhosis evident on CT scan 07/2018 but finding overlooked and formal diagnoses not made until 07/2019  ? Colon cancer ([place]) 1999  ? Partial colectomy  ? Coronary artery disease   ? Diabetes mellitus without complication ([place])   ? Diverticulosis   ? 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 Gastric AVM   ? 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 Obesity 07/2019  ? Portal hypertension ([facility])   ? Tubular adenoma of colon   ? ?Past Surgical History:  ?Past Surgical History:  ?Procedure Laterality Date  ? APPLICATION OF WOUND VAC Right 09/30/2018  ? Procedure: Application Of Wound Vac;  Surgeon: Altamese Pandora, MD;  Location: [place];  Service: Orthopedics;  Laterality: Right;  ? COLONOSCOPY  2010  ? Removed 2 polyps of unclear type.  Not performed in [place]  ? COLONOSCOPY WITH PROPOFOL N/A 09/17/2019  ? Procedure: COLONOSCOPY WITH PROPOFOL;  Surgeon: [name], MD;  Location: [place];  Service: Endoscopy;  Laterality: N/A;  ? CORONARY ARTERY BYPASS GRAFT    ? in his 40s  ? ESOPHAGOGASTRODUODENOSCOPY (EGD) WITH PROPOFOL N/A 08/14/2019  ? Procedure: ESOPHAGOGASTRODUODENOSCOPY (EGD) WITH PROPOFOL;  Surgeon: [name], MD;  Location: [facility] ENDOSCOPY;  Service: Gastroenterology;  Laterality: N/A;  ? ESOPHAGOGASTRODUODENOSCOPY (EGD) WITH PROPOFOL N/A 09/17/2019  ? Procedure: ESOPHAGOGASTRODUODENOSCOPY (EGD) WITH PROPOFOL;  Surgeon: [name], MD;  Location: [place];   Service: Endoscopy;  Laterality: N/A;  ? ESOPHAGOGASTRODUODENOSCOPY (EGD) WITH PROPOFOL N/A 10/04/2019  ? Procedure: ESOPHAGOGASTRODUODENOSCOPY (EGD) WITH PROPOFOL;  Surgeon: Mauri Pole, MD;  Location: [facility] ENDOSCOPY;  Service: Endoscopy;  Laterality: N/A;  ? GIVENS CAPSULE STUDY N/A 09/17/2019  ? Procedure: GIVENS CAPSULE STUDY;  Surgeon: [name], MD;  Location: [place];  Service: Endoscopy;  Laterality: N/A;  ? HOT HEMOSTASIS N/A 08/14/2019  ? Procedure: HOT HEMOSTASIS (ARGON PLASMA COAGULATION/BICAP);  Surgeon: [name], MD;  Location: [facility] ENDOSCOPY;  Service: Gastroenterology;  Laterality: N/A;  ? HOT HEMOSTASIS N/A 09/17/2019  ? Procedure: HOT HEMOSTASIS (ARGON PLASMA COAGULATION/BICAP);  Surgeon: [name], MD;  Location: [place];  Service: Endoscopy;  Laterality: N/A;  ? ORIF FEMUR FRACTURE Right 02/04/2021  ? Procedure: OPEN REDUCTION INTERNAL FIXATION (ORIF) DISTAL FEMUR FRACTURE;  Surgeon: Shona Needles, MD;  Location: [place];  Service: Orthopedics;  Laterality: Right;  ? PARTIAL COLECTOMY  2003  ? To address colon cancer  ? PATELLAR TENDON REPAIR Right 09/30/2018  ? Procedure: PATELLA TENDON REPAIR;  Surgeon: Altamese , MD;  Location: [place];  Service: Orthopedics;  Laterality: Right;  ? POLYPECTOMY  09/17/2019  ? Procedure: POLYPECTOMY;  Surgeon: [name], MD;  Location: [facility] ENDOSCOPY;  Service: Endoscopy;;  ? ?HPI:  ?76 year old with PMH: cirrhosis, esophageal varices, colon cancer s/p colectomy, chronic afib admitted 09/05/2021 with acute hypoxic respiratory failure due to COVID 19. His hospitalization was complicated by a new right cerebellar infarct.  ?  ?  Assessment / Plan / Recommendation  ?Clinical Impression ? Mr. Record is not safe to initiate po's at this time. Encountered with open mouth posture, lethargic with decreased responsiveness. Oral examination discovered copious secretions adhered to hard and soft palate removed with assist of tweezers. Tongue coated and  peeling as well. He did not follow instructions for lingual/labial movement. Vocalized weak, could not cough with noted congested reflexive cough. Oral care removed majority of secretions and asked RN to clean regularly. Attempted ice chip which fell from his mouth. He also has NG placed for history of small grade 1 eso varices and for hepatic encephalopathy per chart. Prognosis guarded per MD. ST will follow. Please provide consistent oral care. ?SLP Visit Diagnosis: Dysphagia, unspecified (R13.10) ?   ?Aspiration Risk ? Severe aspiration risk;Risk for inadequate nutrition/hydration  ?  ?Diet Recommendation NPO  ? ?Medication Administration: Via alternative means  ?  ?Other  Recommendations Oral Care Recommendations: Oral care QID;Staff/trained caregiver to provide oral care   ? ?Recommendations for follow up therapy are one component of a multi-disciplinary discharge planning process, led by the attending physician.  Recommendations may be updated based on patient status, additional functional criteria and insurance authorization. ? ?Follow up Recommendations Other (comment) (TBD)  ? ? ?  ?Assistance Recommended at Discharge Frequent or constant Supervision/Assistance  ?Functional Status Assessment Patient has had a recent decline in their functional status and demonstrates the ability to make significant improvements in function in a reasonable and predictable amount of time.  ?Frequency and Duration min 2x/week  ?  ?  ?   ? ?Prognosis Prognosis for Safe Diet Advancement: Fair ?Barriers to Reach Goals: Severity of deficits  ? ?  ? ?Swallow Study   ?General Date of Onset: 08/30/2021 ?HPI: 76 year old with PMH: cirrhosis, esophageal varices, colon cancer s/p colectomy, chronic afib admitted 09/18/2021 with acute hypoxic respiratory failure due to COVID 19. His hospitalization was complicated by a new right cerebellar infarct. ?Type of Study: Bedside Swallow Evaluation ?Previous Swallow Assessment: none ?Diet Prior to  this Study: NPO ?Temperature Spikes Noted: No ?Respiratory Status: Nasal cannula ?History of Recent Intubation: No ?Behavior/Cognition: Lethargic/Drowsy;Requires cueing ?Oral Cavity Assessment: Dried secretions;Excessive secretions;Dry ?Oral Care Completed by SLP: Yes ?Oral Cavity - Dentition: Missing dentition ?Vision:  (unknown) ?Self-Feeding Abilities: Total assist ?Patient Positioning: Upright in bed ?Baseline Vocal Quality:  (no verbalization) ?Volitional Cough: Cognitively unable to elicit ?Volitional Swallow: Unable to elicit  ?  ?Oral/Motor/Sensory Function Overall Oral Motor/Sensory Function:  (unable to participate)   ?Ice Chips Ice chips: Impaired ?Presentation: Spoon ?Oral Phase Impairments: Reduced labial seal;Poor awareness of bolus ?Oral Phase Functional Implications:  (anterior spill) ?Pharyngeal Phase Impairments:  (no swallow)   ?Thin Liquid Thin Liquid: Not tested  ?  ?Nectar Thick Nectar Thick Liquid: Not tested   ?Honey Thick Honey Thick Liquid: Not tested   ?Puree Puree: Not tested   ?Solid ? ? ?  Solid: Not tested  ? ?  ? ?Houston Siren ?09/25/2021,9:34 AM ? ? ? ?

## 2021-09-25 NOTE — Progress Notes (Addendum)
STROKE TEAM PROGRESS NOTE   INTERVAL HISTORY No family at the bedside. Plan for a TEE when stable per primary, still encephlopathic Not following commands  Vitals:   09/24/21 1300 09/24/21 2015 09/24/21 2315 09/25/21 0515  BP: 101/70 103/60 128/77 122/60  Pulse: 98 87 96 98  Resp: (!) 22 (!) 21 (!) 23 (!) 22  Temp:  97.8 F (36.6 C) 97.9 F (36.6 C) 99.2 F (37.3 C)  TempSrc:  Axillary Axillary Axillary  SpO2: 96% 98% 98% 96%  Weight:      Height:       CBC:  Recent Labs  Lab 09/13/2021 1230 09/02/2021 2012 09/24/21 0557 09/25/21 0648  WBC 9.0   < > 9.7 10.6*  NEUTROABS 8.2*  --   --   --   HGB 14.9   < > 11.1* 10.8*  HCT 48.1   < > 38.1* 36.9*  MCV 88.4   < > 90.9 90.9  PLT PLATELET CLUMPS NOTED ON SMEAR, UNABLE TO ESTIMATE   < > 83* 82*   < > = values in this interval not displayed.   Basic Metabolic Panel:  Recent Labs  Lab 09/24/21 2230 09/25/21 0648  NA 150* 154*  K 3.9 4.2  CL 120* 122*  CO2 25 24  GLUCOSE 171* 199*  BUN 47* 47*  CREATININE 1.09 1.16  CALCIUM 9.9 9.9   Lipid Panel:  Recent Labs  Lab 09/24/21 1215  CHOL 96  TRIG 200*  HDL 20*  CHOLHDL 4.8  VLDL 40  LDLCALC 36   HgbA1c:  Recent Labs  Lab 09/21/21 1057  HGBA1C 6.8*   Urine Drug Screen: No results for input(s): LABOPIA, COCAINSCRNUR, LABBENZ, AMPHETMU, THCU, LABBARB in the last 168 hours.  Alcohol Level No results for input(s): ETH in the last 168 hours.  IMAGING past 24 hours CT ANGIO HEAD NECK W WO CM  Result Date: 09/24/2021 CLINICAL DATA:  Acute neuro deficit.  Rule out basilar occlusion. EXAM: CT ANGIOGRAPHY HEAD AND NECK TECHNIQUE: Multidetector CT imaging of the head and neck was performed using the standard protocol during bolus administration of intravenous contrast. Multiplanar CT image reconstructions and MIPs were obtained to evaluate the vascular anatomy. Carotid stenosis measurements (when applicable) are obtained utilizing NASCET criteria, using the distal internal  carotid diameter as the denominator. RADIATION DOSE REDUCTION: This exam was performed according to the departmental dose-optimization program which includes automated exposure control, adjustment of the mA and/or kV according to patient size and/or use of iterative reconstruction technique. CONTRAST:  36mL OMNIPAQUE IOHEXOL 350 MG/ML SOLN COMPARISON:  CT head 09/24/2021 FINDINGS: CTA NECK FINDINGS Aortic arch: Atherosclerotic calcification aortic arch and proximal great vessels. No aneurysm or stenosis. Right carotid system: Atherosclerotic calcification right carotid bifurcation. 25% diameter stenosis proximal right internal carotid artery. Left carotid system: Atherosclerotic calcification left carotid bifurcation. No significant stenosis Vertebral arteries: Both vertebral arteries patent to the skull base without stenosis. Skeleton: Cervical spondylosis.  No acute skeletal abnormality. Other neck: No soft tissue mass or edema in the neck. NG tube in the esophagus. Esophagus is dilated and thick walled. Upper chest: Right upper lobe clear. Mild left upper lobe atelectasis or infiltrate with small left effusion. Review of the MIP images confirms the above findings CTA HEAD FINDINGS Anterior circulation: Atherosclerotic calcification in the cavernous carotid bilaterally without significant stenosis. Anterior and middle cerebral arteries widely patent bilaterally without stenosis or occlusion Posterior circulation: Mild to moderate stenosis distal right vertebral artery above the foramen magnum due  to calcific plaque. Both vertebral arteries are patent to the basilar. Left PICA patent. Right PICA not visualized. Basilar widely patent without stenosis. Superior cerebellar arteries patent bilaterally. Note is made of acute right superior cerebellar artery on CT. Posterior cerebral arteries are patent bilaterally without stenosis. Fetal origin right posterior cerebral artery. Venous sinuses: Normal venous enhancement  Anatomic variants: None Review of the MIP images confirms the above findings IMPRESSION: 1. The right superior cerebellar artery is patent and the basilar is patent. 2. There is a mild to moderate stenosis distal right vertebral artery at the level of PICA origin. Right PICA not visualized. 3. Atherosclerotic calcification the carotid bifurcation bilaterally without flow limiting stenosis. Electronically Signed   By: Franchot Gallo M.D.   On: 09/24/2021 14:48   MR BRAIN W WO CONTRAST  Result Date: 09/25/2021 CLINICAL DATA:  Stroke follow-up. Facial droop and right-sided weakness. EXAM: MRI HEAD WITHOUT AND WITH CONTRAST TECHNIQUE: Multiplanar, multiecho pulse sequences of the brain and surrounding structures were obtained without and with intravenous contrast. CONTRAST:  69mL GADAVIST GADOBUTROL 1 MMOL/ML IV SOLN COMPARISON:  Head CT and CTA 09/24/2021 FINDINGS: Multiple sequences are mildly to moderately motion degraded. Brain: Acute infarcts measure 3.5 cm in the right cerebellar hemisphere (SCA territory) and 2.5 cm in the splenium of the corpus callosum on the right. Additional smaller acute infarcts measuring 1 cm or less are noted in the left parietal and left occipital periventricular white matter and bilateral posterior frontal cortex. There is a punctate acute infarct versus artifact in the midline of the ventral midbrain. There is petechial hemorrhage associated with the right cerebellar and possibly right frontal infarcts. Patchy T2 hyperintensities in the cerebral white matter bilaterally are nonspecific but compatible with moderate chronic small vessel ischemic disease. There is mild cerebral atrophy. Encephalomalacia is noted anteriorly in the right temporal lobe and could be posttraumatic or ischemic. No definite abnormal enhancement is identified within limitations of motion artifact. No mass, midline shift, or extra-axial fluid collection is evident. Vascular: Major intracranial vascular flow  voids are preserved. Skull and upper cervical spine: Unremarkable bone marrow signal. Sinuses/Orbits: Bilateral cataract extraction. Mucosal thickening and fluid in the paranasal sinuses including persistent complete opacification of the left maxillary sinus. Small bilateral mastoid effusions. Other: None. IMPRESSION: 1. Acute infarcts in the right cerebellum and both cerebral hemispheres. 2. Moderate chronic small vessel ischemic disease. Electronically Signed   By: Logan Bores M.D.   On: 09/25/2021 10:13   EEG adult  Result Date: 09/25/2021 Lora Havens, MD     09/25/2021  8:35 AM Patient Name: Brandon [name] MRN: [id number] Epilepsy Attending: Lora Havens Referring Physician/Provider: Donnetta Simpers, MD Date: 09/25/2021 Duration: 27.08 mins Patient history: 76 year old male with altered mental status in the setting of MRSA bacteremia, COVID infection and history of stenosis who had new onset left-sided facial droop and weakness.  EEG evaluate for seizure. Level of alertness: lethargic AEDs during EEG study: None Technical aspects: This EEG study was done with scalp electrodes positioned according to the 10-20 International system of electrode placement. Electrical activity was acquired at a sampling rate of 500Hz  and reviewed with a high frequency filter of 70Hz  and a low frequency filter of 1Hz . EEG data were recorded continuously and digitally stored. Description: No clear posterior dominant rhythm was seen.  EEG showed continuous generalized predominantly 6 to 7 Hz theta slowing admixed with 2 to 3 Hz delta slowing. Hyperventilation and photic stimulation were not performed.   ABNORMALITY -  Continuous slow, generalized IMPRESSION: This study is suggestive of moderate diffuse encephalopathy, nonspecific etiology. No seizures or epileptiform discharges were seen throughout the recording. [place]    PHYSICAL EXAM  Physical Exam  Constitutional: Appears well-developed and well-nourished.   Cardiovascular: Normal rate and regular rhythm.  Respiratory: Effort normal, non-labored breathing  Neuro: Mental Status: Patient is drowsy, doesn't follow commands. Appears encephalopathic Cranial Nerves: II: Pupils are equal, round, and reactive to light.  Left gaze preference, comes to midline, oculocephalic reflex intact III,IV, VI: EOMI without ptosis or diploplia.  V: Facial sensation is symmetric to temperature VII: Left-sided facial droop VIII: Does not respond to verbal stimuli X: Cough and gag intact XI: Head is midline XII: Does not protrude tongue on assessment Motor: Tone is normal. Bulk is normal.  No rigidity or resistance to movement No spontaneous movement noted in lower extremities, nonpurposeful movement with noxious stimuli in upper extremities Sensory: Withdraws to pain in all extremities.  Plantars: Left toe up  Right toe mute    ASSESSMENT/PLAN Mr. Rishik Tubby is a 76 y.o. male from [facility] with history of type 2 diabetes, cirrhosis, esophageal varices, portal hypertension, thrombocytopenia, colon cancer GI bleed due to gastric AVMs, chronic atrial fibrillation, obesity, chronic indwelling catheter presenting with hypoxia.  Neurology was consulted 2/28 due to acute onset of left facial droop, left gaze preference, decreased movement in right upper extremity in comparison to the left.  CT head demonstrated acute/subacute SCA infarct in the right cerebellar hemisphere with no hemorrhage or mass effect.  Palliative care consult placed by primary team.  Maintain sodium between 145-155, keep euvolemic given history of heart failure.  Resume diuretics when medically appropriate.  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  Right PICA not visualized.  Atherosclerotic calcification in the carotid bifurcation bilaterally without flow-limiting stenosis MRI  Acute infarcts in the right cerebellum and both cerebral hemispheres. 2D Echo EF is 55-60% LDL 36 HgbA1c 6.8 VTE prophylaxis -SCDs    Diet   Diet NPO time specified   No antithrombotic prior to admission, now on No antithrombotic.  Hold antiplatelet therapy until endocarditis is ruled out Therapy recommendations: Pending Disposition: Pending  Chronic atrial fibrillation Home meds: Currently not anticoagulated related to GI bleed Rule out endocarditis  Hypertension Home meds: Coreg Stable Permissive hypertension (OK if < 220/120) but gradually normalize in 5-7 days Long-term BP goal normotensive  Hyperlipidemia Home meds:  Pravastatin, resumed in hospital LDL 36, goal < 70 High intensity statin not indicated  Continue statin at discharge  Diabetes type II Controlled Home meds:  Metformin HgbA1c 6.8, goal < 7.0 CBGs Recent Labs    09/25/21 0403 09/25/21 0805 09/25/21 1129  GLUCAP 192* 176* 137*    SSI  Other Stroke Risk Factors Advanced Age >/= 65  Coronary artery disease Congestive heart failure management by primary  Preserved EF On Lasix and spironolactone  Other Active Problems Hepatic encephalopathy- management by primary team GOC- palliative care consult Dysphagia Cortrak in place Hypernatremia Goal sodium 145-155  Hospital day # 4  Patient seen and examined by NP/APP with MD. MD  to update note as needed.   Janine Ores, DNP, FNP-BC [facility] Pager: [phone]  ATTENDING ATTESTATION:  76 year old known history of chronic A-fib not on anticoagulation due to history  of GI bleed, history of colon cancer type 2 diabetes hypertension.  Initially presented with altered mental status but had abrupt change yesterday neurology consulted.  CT showed right superior cerebellar CVA.  Not a candidate for intervention.  He has MRSA bacteremia there is concern for bacterial endocarditis and antiplatelets held for this reason.  MRI completed today shows right cerebellum and both cerebral hemisphere CVA concern for cardioembolic source.  Echo on 227 EF of 55 to 60%.  Sodium was 156.  EEG shows diffuse encephalopathy.  No seizures  family has requested transition to comfort care.  Neurology will sign off, please call with questions  Dr. Reeves Forth evaluated pt independently, reviewed imaging, chart, labs. Discussed and formulated plan with the APP. Please see APP note above for details.   Total 36 minutes spent on counseling patient and coordinating care, writing notes and reviewing chart.  Melo Stauber,MD    To contact Stroke Continuity provider, please refer to [email or web address] After hours, contact General Neurology

## 2021-09-25 NOTE — Procedures (Signed)
Patient Name: Brandon [name]  ?MRN: [id number]  ?Epilepsy Attending: Lora Havens  ?Referring Physician/Provider: Donnetta Simpers, MD ?Date: 09/25/2021 ?Duration: 27.08 mins ? ?Patient history: 76 year old male with altered mental status in the setting of MRSA bacteremia, COVID infection and history of stenosis who had new onset left-sided facial droop and weakness.  EEG evaluate for seizure. ? ?Level of alertness: lethargic  ? ?AEDs during EEG study: None ? ?Technical aspects: This EEG study was done with scalp electrodes positioned according to the 10-20 International system of electrode placement. Electrical activity was acquired at a sampling rate of 500Hz  and reviewed with a high frequency filter of 70Hz  and a low frequency filter of 1Hz . EEG data were recorded continuously and digitally stored.  ? ?Description: No clear posterior dominant rhythm was seen.  EEG showed continuous generalized predominantly 6 to 7 Hz theta slowing admixed with 2 to 3 Hz delta slowing. Hyperventilation and photic stimulation were not performed.    ? ?ABNORMALITY ?- Continuous slow, generalized ? ?IMPRESSION: ?This study is suggestive of moderate diffuse encephalopathy, nonspecific etiology. No seizures or epileptiform discharges were seen throughout the recording. ? ?Lora Havens  ? ?

## 2021-09-25 NOTE — Progress Notes (Signed)
EEG complete - results pending 

## 2021-09-25 DEATH — deceased

## 2021-09-26 DIAGNOSIS — R0682 Tachypnea, not elsewhere classified: Secondary | ICD-10-CM

## 2021-09-26 MED ORDER — ONDANSETRON 4 MG PO TBDP
4.0000 mg | ORAL_TABLET | Freq: Four times a day (QID) | ORAL | 0 refills | Status: AC | PRN
Start: 2021-09-26 — End: ?

## 2021-09-26 MED ORDER — LORAZEPAM 1 MG PO TABS: 1.0000 mg | ORAL_TABLET | ORAL | 0 refills | Status: AC | PRN

## 2021-09-26 MED ORDER — BIOTENE DRY MOUTH MT LIQD: 15.0000 mL | Freq: Two times a day (BID) | OROMUCOSAL | Status: AC

## 2021-09-26 MED ORDER — HALOPERIDOL LACTATE 2 MG/ML PO CONC: 0.5000 mg | ORAL | 0 refills | Status: AC | PRN

## 2021-09-26 MED ORDER — LORAZEPAM 2 MG/ML PO CONC: 1.0000 mg | ORAL | 0 refills | Status: AC | PRN

## 2021-09-26 MED ORDER — MORPHINE SULFATE (PF) 2 MG/ML IV SOLN: 2.0000 mg | INTRAVENOUS | 0 refills | Status: AC | PRN

## 2021-09-26 MED ORDER — HALOPERIDOL LACTATE 5 MG/ML IJ SOLN
0.5000 mg | INTRAMUSCULAR | Status: AC | PRN
Start: 2021-09-26 — End: ?

## 2021-09-26 MED ORDER — GLYCOPYRROLATE 0.2 MG/ML IJ SOLN: 0.2000 mg | INTRAMUSCULAR | Status: AC | PRN

## 2021-09-26 MED ORDER — GLYCOPYRROLATE 0.2 MG/ML IJ SOLN
0.2000 mg | INTRAMUSCULAR | Status: AC | PRN
Start: 2021-09-26 — End: ?

## 2021-09-26 MED ORDER — POLYVINYL ALCOHOL 1.4 % OP SOLN: 1.0000 [drp] | Freq: Four times a day (QID) | OPHTHALMIC | 0 refills | Status: AC | PRN

## 2021-09-26 MED ORDER — LORAZEPAM 2 MG/ML IJ SOLN
1.0000 mg | INTRAMUSCULAR | 0 refills | Status: AC | PRN
Start: 2021-09-26 — End: ?

## 2021-09-26 MED ORDER — GLYCOPYRROLATE 1 MG PO TABS: 1.0000 mg | ORAL_TABLET | ORAL | Status: AC | PRN

## 2021-09-26 MED ORDER — ONDANSETRON HCL 4 MG/2ML IJ SOLN: 4.0000 mg | Freq: Four times a day (QID) | INTRAMUSCULAR | 0 refills | Status: AC | PRN

## 2021-09-26 MED ORDER — HALOPERIDOL 0.5 MG PO TABS: 0.5000 mg | ORAL_TABLET | ORAL | Status: AC | PRN

## 2021-09-27 LAB — CULTURE, BLOOD (ROUTINE X 2)
Culture: NO GROWTH
Culture: NO GROWTH
Special Requests: ADEQUATE

## 2021-10-26 NOTE — Progress Notes (Signed)
Patient has expired, two RN's verified at [place]. ? ?On-call MD notified. ? ?Son Ashleigh Arya is on his way to spend time with patient before he is moved to the morgue. ? ?[facility] notified Ref# [id number] ? ?Time: 2037 ? ?Son Richardson Landry has arrived to unit. ? ?Time: 2040 ? ?Son has left the unit. He provided name of Funeral Home:  [employer], [phone] ?

## 2021-10-26 NOTE — Progress Notes (Signed)
? ?  Progress Note  ? ?Date: 10/02/2021 ? ?Patient Name: Brandon [name]        ?MRN#: [id number] ? ? ?Clarification of the diagnosis of pressure ulcer(s): ? ? 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   ? ? ? ? ?

## 2021-10-26 NOTE — Discharge Summary (Signed)
Name: Brandon [name] MRN: [id number] DOB: 04/04/1946 76 y.o. 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 Hypernatremia T2DM Chronic atrial fibrillation Chronic HFpEF (EF 55-60%) HLD  Discharge Medications: Allergies as of 10/11/2021   No Known Allergies      Medication List     STOP taking these medications    carvedilol 25 MG tablet Commonly known as: COREG   enoxaparin 30 MG/0.3ML injection Commonly known as: LOVENOX   enoxaparin 40 MG/0.4ML injection Commonly known as: LOVENOX   feeding supplement (PRO-STAT SUGAR FREE 64) Liqd   finasteride 5 MG tablet Commonly known as: PROSCAR   furosemide 20 MG tablet Commonly known as: LASIX   gabapentin 300 MG capsule Commonly known as: NEURONTIN   hydrOXYzine 25 MG capsule Commonly known as: VISTARIL   lactulose 10 GM/15ML solution Commonly known as: CHRONULAC   metFORMIN 500 MG 24 hr tablet Commonly known as: GLUCOPHAGE-XR   ondansetron 4 MG tablet Commonly known as: ZOFRAN Replaced by: ondansetron 4 MG/2ML Soln injection   pantoprazole 40 MG tablet Commonly known as: PROTONIX   pravastatin 10 MG tablet Commonly known as: PRAVACHOL   rosuvastatin 5 MG tablet Commonly known as: CRESTOR   sertraline 25 MG tablet Commonly known as: ZOLOFT   spironolactone 50 MG tablet Commonly known as: ALDACTONE   tamsulosin 0.4 MG Caps capsule Commonly known as: FLOMAX   vitamin C 500 MG tablet Commonly known as: ASCORBIC ACID   zinc sulfate 220 (50 Zn) MG capsule       TAKE these medications    acetaminophen 500 MG tablet Commonly known as: TYLENOL Take 1,000-1,500 mg by mouth every 6 (six) hours  as needed for moderate pain.   antiseptic oral rinse Liqd Apply 15 mLs topically 2 (two) times daily.   glycopyrrolate 0.2 MG/ML injection Commonly known as: ROBINUL Inject 1 mL (0.2 mg total) into the vein every 4 (four) hours as needed (excessive secretions).   glycopyrrolate 0.2 MG/ML injection Commonly known as: ROBINUL Inject 1 mL (0.2 mg total) into the skin every 4 (four) hours as needed (excessive secretions).   glycopyrrolate 1 MG tablet Commonly known as: ROBINUL Take 1 tablet (1 mg total) by mouth every 4 (four) hours as needed (excessive secretions).   haloperidol 0.5 MG tablet Commonly known as: HALDOL Take 1 tablet (0.5 mg total) by mouth every 4 (four) hours as needed for agitation (or delirium).   haloperidol 2 MG/ML solution Commonly known as: HALDOL Place 0.3 mLs (0.6 mg total) under the tongue every 4 (four) hours as needed for agitation (or delirium).   haloperidol lactate 5 MG/ML injection Commonly known as: HALDOL Inject 0.1 mLs (0.5 mg total) into the vein every 4 (four) hours as needed (or delirium).   LORazepam 1 MG tablet Commonly known as: ATIVAN Take 1 tablet (1 mg total) by mouth every hour as needed for anxiety, seizure or sedation (distress).   LORazepam 2 MG/ML concentrated solution Commonly known as: ATIVAN Place 0.5 mLs (1 mg total) under the tongue every hour as needed for anxiety, seizure or sedation (distress).   LORazepam 2 MG/ML injection Commonly known as: ATIVAN Inject 0.5 mLs (1 mg total) into the vein every hour as needed for anxiety, seizure or sedation (  distress).   morphine (PF) 2 MG/ML injection Inject 1 mL (2 mg total) into the vein every 2 (two) hours as needed (dyspnea, increased work of breathing, RR >25, distress).   ondansetron 4 MG disintegrating tablet Commonly known as: ZOFRAN-ODT Take 1 tablet (4 mg total) by mouth every 6 (six) hours as needed for nausea.   ondansetron 4 MG/2ML Soln injection Commonly known as:  ZOFRAN Inject 2 mLs (4 mg total) into the vein every 6 (six) hours as needed for nausea. Replaces: ondansetron 4 MG tablet   polyvinyl alcohol 1.4 % ophthalmic solution Commonly known as: LIQUIFILM TEARS Place 1 drop into both eyes 4 (four) times daily as needed for dry eyes.   triamcinolone cream 0.1 % Commonly known as: KENALOG Apply 1 application topically 3 (three) times daily.        Disposition and follow-up:   BrandonManjinder Wien was discharged from [facility] in Critical condition.  At the hospital follow up visit please address:  1.  Comfort Care: Patient transitioned to comfort care during inpatient stay. Please provide all measures necessary to maintain comfort and dignity. Transferring patient to [facility] for ongoing comfort measures.  2.  Labs / imaging needed at time of follow-up: Brandon [name]  3.  Pending labs/ test needing follow-up: Brandon [name]  Follow-up Appointments:   Hospital Course by problem list: 1. Comfort care: Mr. Mich initially presented from [facility] (LTC) for hypoxia, fever, and acute metabolic encephalopathy thought to be multifactorial from UTI, COVID infection, MRSA bacteremia, and hx of cirrhosis. He received appropriate treatment for these. Unfortunately, he developed acute focal neurological deficits (R sided facial droop and favoring right extremities) with imaging showing acute CVA in right cerebellum and bilateral cerebral hemispheres, thought to be secondary to cardioembolic source. Ultimately, palliative care was consulted for [place] discussions and family has requested to transition him to comfort care. Full comfort measures were started yesterday. He will be transferred to [place] for ongoing comfort measures.  Subjective: Brandon [name] is non-verbal, lying in bed, appears to be near end-of-life. He is tachypneic and mouth-breathing (unsure if nasal cannula is helping). His peripheries are warm,  possibly indicative of fever although no recorded fevers on chart review. Plan for discharge to [place] today for ongoing comfort measures.  Discharge Exam:   BP (!) 118/58 (BP Location: Right Arm)    Pulse (!) 105    Temp 99.2 F (37.3 C) (Oral)    Resp (!) 22    Ht 6' (1.829 m)    Wt 122.5 kg    SpO2 95%    BMI 36.62 kg/m  Discharge exam:  Constitutional: ill-appearing elderly male, lying in bed, mouth-breathing, appears to be approaching end-of-life. Pulm: Mouth-breathing, tachypneic. Abdomen: soft, non-distended.  Extremities: warm periphery Neuro: unresponsive to verbal stimuli.  Pertinent Labs, Studies, and Procedures:  CBC Latest Ref Rng & Units 09/25/2021 09/24/2021 09/23/2021  WBC 4.0 - 10.5 K/uL 10.6(H) 9.7 7.3  Hemoglobin 13.0 - 17.0 g/dL 10.8(L) 11.1(L) 11.0(L)  Hematocrit 39.0 - 52.0 % 36.9(L) 38.1(L) 36.2(L)  Platelets 150 - 400 K/uL 82(L) 83(L) 68(L)   CMP Latest Ref Rng & Units 09/25/2021 09/25/2021 09/24/2021  Glucose 70 - 99 mg/dL 168(H) 199(H) 171(H)  BUN 8 - 23 mg/dL 48(H) 47(H) 47(H)  Creatinine 0.61 - 1.24 mg/dL 1.15 1.16 1.09  Sodium 135 - 145 mmol/L 156(H) 154(H) 150(H)  Potassium 3.5 - 5.1 mmol/L 4.4 4.2 3.9  Chloride 98 - 111 mmol/L 121(H) 122(H) 120(H)  CO2 22 - 32 mmol/L 26 24 25   Calcium 8.9 - 10.3 mg/dL 9.9 9.9 9.9  Total Protein 6.5 - 8.1 g/dL - - -  Total Bilirubin 0.3 - 1.2 mg/dL - - -  Alkaline Phos 38 - 126 U/L - - -  AST 15 - 41 U/L - - -  ALT 0 - 44 U/L - - -   Urinalysis    Component Value Date/Time   COLORURINE YELLOW 09/03/2021 1258   APPEARANCEUR CLOUDY (A) 09/06/2021 1258   LABSPEC 1.020 09/22/2021 1258   PHURINE 6.5 09/10/2021 1258   GLUCOSEU NEGATIVE 09/06/2021 1258   HGBUR LARGE (A) 09/21/2021 1258   BILIRUBINUR SMALL (A) 09/05/2021 1258   KETONESUR NEGATIVE 08/30/2021 1258   PROTEINUR >300 (A) 09/15/2021 1258   NITRITE NEGATIVE 09/19/2021 1258   LEUKOCYTESUR MODERATE (A) 09/17/2021 1258    Blood Culture     Component Value Date/Time   SDES BLOOD RIGHT HAND 09/22/2021 1754   SDES BLOOD RIGHT ANTECUBITAL 09/22/2021 1754   SPECREQUEST  09/22/2021 1754    BOTTLES DRAWN AEROBIC ONLY Blood Culture results may not be optimal due to an inadequate volume of blood received in culture bottles   SPECREQUEST  09/22/2021 1754    BOTTLES DRAWN AEROBIC ONLY Blood Culture adequate volume   CULT  09/22/2021 1754    NO GROWTH 4 DAYS Performed at [facility], [place] [address], [place], [place] [address]    CULT  09/22/2021 1754    NO GROWTH 4 DAYS Performed at [facility], [place] [address], [place], [place] [address]    REPTSTATUS PENDING 09/22/2021 1754   REPTSTATUS PENDING 09/22/2021 1754   Lactic Acid, Venous    Component Value Date/Time   LATICACIDVEN 2.2 (HH) 09/03/2021 1430   CT ABDOMEN PELVIS WO CONTRAST  Result Date: 08/29/2021 CLINICAL DATA:  Altered mental status EXAM: CT ABDOMEN AND PELVIS WITHOUT CONTRAST TECHNIQUE: Multidetector CT imaging of the abdomen and pelvis was performed following the standard protocol without IV contrast. RADIATION DOSE REDUCTION: This exam was performed according to the departmental dose-optimization program which includes automated exposure control, adjustment of the mA and/or kV according to patient size and/or use of iterative reconstruction technique. COMPARISON:  Chest x-ray 09/13/2021, CT 03/20/2021, CT 08/14/2019, MRI 10/24/2019 FINDINGS: Lower chest: Lung bases demonstrate no acute consolidation. Cardiomegaly. Gynecomastia. No significant effusion Hepatobiliary: Liver cirrhosis. 6 x 3.8 cm mass within the left hepatic lobe, series 3, image 39 is grossly unchanged since 2020. No calcified gallstone or biliary dilatation Pancreas: Unremarkable. No pancreatic ductal dilatation or surrounding inflammatory changes. Spleen: Enlarged measuring 15 cm. Adrenals/Urinary Tract: Nodular thickening left adrenal with no mass. Right adrenal is normal. Kidneys show no  hydronephrosis. Cysts within the bilateral kidneys. Catheter in the bladder. Lobulated appearing bladder with thick wall and scattered foci of gas Stomach/Bowel: Stomach nonenlarged. No dilated small bowel. No acute bowel wall thickening. Nonvisualized appendix. Postsurgical changes at the ascending colon. Diverticular disease of the left colon without acute wall thickening. Vascular/Lymphatic: Advanced aortic atherosclerosis. No aneurysm. Small distal esophageal varices. Reproductive: Negative for mass. Other: Negative for pelvic effusion or free air. Small fat containing periumbilical and supraumbilical ventral hernias. Musculoskeletal: No acute osseous abnormality. IMPRESSION: 1. Slightly thick-walled appearance of the urinary bladder with perivesical stranding, question cystitis. Small foci of gas are present likely related to catheter placement. Lobulated somewhat flaccid appearing bladder suggestive of neurogenic bladder. 2. Liver cirrhosis with grossly  stable 6 cm mass, previously evaluated with MRI. Lack of interval growth favors benign process. Splenomegaly and small distal esophageal varices. 3. Fat containing periumbilical and ventral hernias. Electronically Signed   By: Donavan Foil M.D.   On: 09/01/2021 21:10   DG Chest Port 1 View  Result Date: 09/10/2021 CLINICAL DATA:  Questionable sepsis EXAM: PORTABLE CHEST 1 VIEW COMPARISON:  March 20, 2021. FINDINGS: Similar cardiac enlargement. Tortuous thoracic aorta with aortic atherosclerosis. Low lung volumes with dependent atelectasis. No visible pleural effusion or pneumothorax. Prior median sternotomy. IMPRESSION: 1. Low lung volumes with dependent atelectasis. 2. Similar enlarged cardiac silhouette. Electronically Signed   By: Dahlia Bailiff M.D.   On: 09/11/2021 12:57   VAS [place] UPPER EXTREMITY ARTERIAL DUPLEX  Result Date: 09/21/2021  UPPER EXTREMITY DUPLEX STUDY Patient Name:  SELVIN YUN  Date of Exam:   09/15/2021 Medical Rec #: [id number]      Accession #:    [id number] Date of Birth: [birth year]     Patient Gender: M Patient Age:   74 years Exam Location:  [facility] Procedure:      VAS [place] UPPER EXTREMITY ARTERIAL DUPLEX Referring Phys: Dorian Pod --------------------------------------------------------------------------------  Indications: Cool left upper extremity.  Limitations: patient uncooperative Comparison Study: No prior study Performing Technologist: Maudry Mayhew MHA, RDMS, RVT, RDCS  Examination Guidelines: A complete evaluation includes B-mode imaging, spectral Doppler, color Doppler, and power Doppler as needed of all accessible portions of each vessel. Bilateral testing is considered an integral part of a complete examination. Limited examinations for reoccurring indications may be performed as noted.  Left Doppler Findings: +---------------+----------+---------+--------+--------+  Site            PSV (cm/s) Waveform  Stenosis Comments  +---------------+----------+---------+--------+--------+  Subclavian Prox 41         triphasic                    +---------------+----------+---------+--------+--------+  Subclavian Dist 43         triphasic                    +---------------+----------+---------+--------+--------+  Axillary        62         triphasic                    +---------------+----------+---------+--------+--------+  Brachial Prox   59         triphasic                    +---------------+----------+---------+--------+--------+  Brachial Dist   74         triphasic                    +---------------+----------+---------+--------+--------+  Radial Prox     46         triphasic                    +---------------+----------+---------+--------+--------+  Radial Dist     36         triphasic                    +---------------+----------+---------+--------+--------+  Ulnar Prox      86         triphasic                    +---------------+----------+---------+--------+--------+  Ulnar Dist      62  triphasic                    +---------------+----------+---------+--------+--------+   Summary:  Left: No obstruction visualized in the left upper extremity. *See table(s) above for measurements and observations. Electronically signed by Monica Martinez MD on 09/21/2021 at 12:53:07 PM.    Final     CT ANGIO HEAD NECK W WO CM  Result Date: 09/24/2021 CLINICAL DATA:  Acute neuro deficit.  Rule out basilar occlusion. EXAM: CT ANGIOGRAPHY HEAD AND NECK TECHNIQUE: Multidetector CT imaging of the head and neck was performed using the standard protocol during bolus administration of intravenous contrast. Multiplanar CT image reconstructions and MIPs were obtained to evaluate the vascular anatomy. Carotid stenosis measurements (when applicable) are obtained utilizing NASCET criteria, using the distal internal carotid diameter as the denominator. RADIATION DOSE REDUCTION: This exam was performed according to the departmental dose-optimization program which includes automated exposure control, adjustment of the mA and/or kV according to patient size and/or use of iterative reconstruction technique. CONTRAST:  76mL OMNIPAQUE IOHEXOL 350 MG/ML SOLN COMPARISON:  CT head 09/24/2021 FINDINGS: CTA NECK FINDINGS Aortic arch: Atherosclerotic calcification aortic arch and proximal great vessels. No aneurysm or stenosis. Right carotid system: Atherosclerotic calcification right carotid bifurcation. 25% diameter stenosis proximal right internal carotid artery. Left carotid system: Atherosclerotic calcification left carotid bifurcation. No significant stenosis Vertebral arteries: Both vertebral arteries patent to the skull base without stenosis. Skeleton: Cervical spondylosis.  No acute skeletal abnormality. Other neck: No soft tissue mass or edema in the neck. NG tube in the esophagus. Esophagus is dilated and thick walled. Upper chest: Right upper lobe clear. Mild left upper lobe atelectasis or infiltrate with small left  effusion. Review of the MIP images confirms the above findings CTA HEAD FINDINGS Anterior circulation: Atherosclerotic calcification in the cavernous carotid bilaterally without significant stenosis. Anterior and middle cerebral arteries widely patent bilaterally without stenosis or occlusion Posterior circulation: Mild to moderate stenosis distal right vertebral artery above the foramen magnum due to calcific plaque. Both vertebral arteries are patent to the basilar. Left PICA patent. Right PICA not visualized. Basilar widely patent without stenosis. Superior cerebellar arteries patent bilaterally. Note is made of acute right superior cerebellar artery on CT. Posterior cerebral arteries are patent bilaterally without stenosis. Fetal origin right posterior cerebral artery. Venous sinuses: Normal venous enhancement Anatomic variants: Brandon [name] Review of the MIP images confirms the above findings IMPRESSION: 1. The right superior cerebellar artery is patent and the basilar is patent. 2. There is a mild to moderate stenosis distal right vertebral artery at the level of PICA origin. Right PICA not visualized. 3. Atherosclerotic calcification the carotid bifurcation bilaterally without flow limiting stenosis. Electronically Signed   By: Franchot Gallo M.D.   On: 09/24/2021 14:48   MR BRAIN W WO CONTRAST  Result Date: 09/25/2021 CLINICAL DATA:  Stroke follow-up. Facial droop and right-sided weakness. EXAM: MRI HEAD WITHOUT AND WITH CONTRAST TECHNIQUE: Multiplanar, multiecho pulse sequences of the brain and surrounding structures were obtained without and with intravenous contrast. CONTRAST:  43mL GADAVIST GADOBUTROL 1 MMOL/ML IV SOLN COMPARISON:  Head CT and CTA 09/24/2021 FINDINGS: Multiple sequences are mildly to moderately motion degraded. Brain: Acute infarcts measure 3.5 cm in the right cerebellar hemisphere (SCA territory) and 2.5 cm in the splenium of the corpus callosum on the right. Additional smaller acute  infarcts measuring 1 cm or less are noted in the left parietal and left occipital periventricular white matter and bilateral posterior frontal cortex. There  is a punctate acute infarct versus artifact in the midline of the ventral midbrain. There is petechial hemorrhage associated with the right cerebellar and possibly right frontal infarcts. Patchy T2 hyperintensities in the cerebral white matter bilaterally are nonspecific but compatible with moderate chronic small vessel ischemic disease. There is mild cerebral atrophy. Encephalomalacia is noted anteriorly in the right temporal lobe and could be posttraumatic or ischemic. No definite abnormal enhancement is identified within limitations of motion artifact. No mass, midline shift, or extra-axial fluid collection is evident. Vascular: Major intracranial vascular flow voids are preserved. Skull and upper cervical spine: Unremarkable bone marrow signal. Sinuses/Orbits: Bilateral cataract extraction. Mucosal thickening and fluid in the paranasal sinuses including persistent complete opacification of the left maxillary sinus. Small bilateral mastoid effusions. Other: Brandon [name]. IMPRESSION: 1. Acute infarcts in the right cerebellum and both cerebral hemispheres. 2. Moderate chronic small vessel ischemic disease. Electronically Signed   By: Logan Bores M.D.   On: 09/25/2021 10:13   EEG adult  Result Date: 09/25/2021 Lora Havens, MD     09/25/2021  8:35 AM Patient Name: Francisca Harbuck MRN: [id number] Epilepsy Attending: Lora Havens Referring Physician/Provider: Donnetta Simpers, MD Date: 09/25/2021 Duration: 27.08 mins Patient history: 76 year old male with altered mental status in the setting of MRSA bacteremia, COVID infection and history of stenosis who had new onset left-sided facial droop and weakness.  EEG evaluate for seizure. Level of alertness: lethargic AEDs during EEG study: Brandon [name] Technical aspects: This EEG study was done with scalp electrodes positioned  according to the 10-20 International system of electrode placement. Electrical activity was acquired at a sampling rate of 500Hz  and reviewed with a high frequency filter of 70Hz  and a low frequency filter of 1Hz . EEG data were recorded continuously and digitally stored. Description: No clear posterior dominant rhythm was seen.  EEG showed continuous generalized predominantly 6 to 7 Hz theta slowing admixed with 2 to 3 Hz delta slowing. Hyperventilation and photic stimulation were not performed.   ABNORMALITY - Continuous slow, generalized IMPRESSION: This study is suggestive of moderate diffuse encephalopathy, nonspecific etiology. No seizures or epileptiform discharges were seen throughout the recording. Lora Havens     Discharge Instructions: Discharge Instructions     Increase activity slowly   Complete by: As directed    No wound care   Complete by: As directed        Signed: Virl Axe, MD Oct 23, 2021, 2:22 PM   Pager: [phone]

## 2021-10-26 NOTE — Progress Notes (Signed)
ID Brief Note ? ?Patient transitioned to comfort care yesterday. ID will sign off.  ?Please call with questions ? ?Rosiland Oz, MD ?Infectious Disease Physician ?[facility] ?[address] ?[place],  [address] ?Phone: [phone]  Fax: [phone] ? ?

## 2021-10-26 NOTE — TOC Transition Note (Signed)
Transition of Care (TOC) - CM/SW Discharge Note ? ? ?Patient Details  ?Name: Brandon [name] ?MRN: [id number] ?Date of Birth: 27-Nov-1945 ? ?Transition of Care [facility]) CM/SW Contact:  ?Joanne Chars, LCSW ?Phone Number: ?10/10/21, 2:53 PM ? ? ?Clinical Narrative:  Pt discharging to [facility].  RN call [phone] for report.   ? ? ? ?Final next level of care: [place] ?Barriers to Discharge: Barriers Resolved ? ? ?Patient Goals and CMS Choice ?Patient states their goals for this hospitalization and ongoing recovery are:: plan to return to [place] ?CMS Medicare.gov Compare Post Acute Care list provided to:: Patient Represenative (must comment) [name], son) ?Choice offered to / list presented to : Adult Children ? ?Discharge Placement ?  ?           ?Patient chooses bed at:  [facility]) ?Patient to be transferred to facility by: [facility] ?Name of family member notified: son [name] ?Patient and family notified of of transfer: 2021/10/10 ? ?Discharge Plan and Services ?In-house Referral: Clinical Social Work ?  ?Post Acute Care Choice: Nursing Home          ?  ?  ?  ?  ?  ?  ?  ?  ?  ?  ? ?Social Determinants of Health (SDOH) Interventions ?  ? ? ?Readmission Risk Interventions ?Readmission Risk Prevention Plan 03/21/2021 10/06/2019 10/06/2019  ?Transportation Screening Complete - Complete  ?PCP or Specialist Appt within 3-5 Days Complete Complete -  ?Portal or Home Care Consult Complete - Complete  ?Social Work Consult for [place] Planning/Counseling Complete - Complete  ?Palliative Care Screening Complete - Not Applicable  ?Medication Review Press photographer) Complete - Complete  ?Some recent data might be hidden  ? ? ? ? ? ?

## 2021-10-26 NOTE — Progress Notes (Signed)
Daily Progress Note   Patient Name: Brandon [name]       Date: 09-30-21 DOB: 12-16-1945  Age: 76 y.o. MRN#: [id number] Attending Physician: Angelica Pou, MD Primary Care Physician: Helane Rima, MD Admit Date: 09/06/2021  Reason for Consultation/Follow-up: Non pain symptom management, Pain control, Psychosocial/spiritual support, and Terminal Care  Subjective: Chart review performed. Received report from primary RN - no acute concerns. RN reports patient is lethargic, not eating/drinking.  Went to visit patient at bedside - no visitors present. Patient was lying in bed asleep - I did not attempt to wake him. No signs or non-verbal gestures of pain or discomfort noted. No respiratory distress, increased work of breathing, or secretions; however, respiratory rate is noted to be 30/min.   Requested RN administer PRN morphine for his RR>25.   Plan is for discharge to [place] likely today per notes.   Length of Stay: 5  Current Medications: Scheduled Meds:   antiseptic oral rinse  15 mL Topical BID    Continuous Infusions:   PRN Meds: glycopyrrolate **OR** glycopyrrolate **OR** glycopyrrolate, haloperidol **OR** haloperidol **OR** haloperidol lactate, LORazepam **OR** LORazepam **OR** LORazepam, morphine injection, ondansetron **OR** ondansetron (ZOFRAN) IV, polyvinyl alcohol  Physical Exam Vitals and nursing note reviewed.  Constitutional:      General: He is not in acute distress.    Appearance: He is cachectic. He is ill-appearing.  Pulmonary:     Effort: No respiratory distress.  Skin:    General: Skin is warm and dry.  Neurological:     Mental Status: He is lethargic.     Motor: Weakness present.            Vital Signs: BP (!) 118/58 (BP  Location: Right Arm)    Pulse (!) 105    Temp 99.2 F (37.3 C) (Oral)    Resp (!) 22    Ht 6' (1.829 m)    Wt 122.5 kg    SpO2 95%    BMI 36.62 kg/m  SpO2: SpO2: 95 % O2 Device: O2 Device: Nasal Cannula O2 Flow Rate: O2 Flow Rate (L/min): 3 L/min  Intake/output summary:  Intake/Output Summary (Last 24 hours) at September 30, 2021 1216 Last data filed at 2021-09-30 0100 Gross per 24 hour  Intake 487.5 ml  Output 1050 ml  Net -562.5  ml   LBM: Last BM Date : 09/24/21 Baseline Weight: Weight: 122.5 kg Most recent weight: Weight: 122.5 kg       Palliative Assessment/Data: PPS 10%      Patient Active Problem List   Diagnosis Date Noted   Cerebrovascular accident (CVA) ([place])    Acute COVID-19 09/23/2021   Altered mental status    MRSA bacteremia 09/21/2021   Encephalopathy 09/16/2021   Encephalopathy, hepatic 03/20/2021   Pressure injury of skin 03/16/2021   UTI (urinary tract infection) 03/16/2021   Generalized weakness 03/15/2021   Disp supracondyl fx w/o intracondylar extension of right distal femur ([place]) 02/05/2021   Hypomagnesemia 02/04/2021   Fall at home, initial encounter 02/04/2021   Cirrhosis of liver (HCC)    Chronic heart failure with preserved ejection fraction (HFpEF) ([place]) 06/07/2020   Hyperkalemia 06/07/2020   Weakness generalized 06/07/2020   Physical deconditioning 06/06/2020   Hypothermia 05/31/2020   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   Alcoholic cirrhosis of liver with ascites ([place])    Portal hypertensive gastropathy ([place])    Melena 10/02/2019   Angiodysplasia of stomach with hemorrhage    Gastrointestinal hemorrhage    Atrial fibrillation (HCC)    Chronic pain syndrome    Symptomatic anemia 08/13/2019   Acute upper GI bleed 08/13/2019   Anemia 07/2019   Avulsion of right patellar tendon    Closed displaced fracture of phalanx of toe of right foot 10/06/2018   Obesity BMI over 52 09/30/2018   Patella fracture, extensor mechanism disruption R knee  09/29/2018    History of colon cancer 08/23/2018   CAD (coronary artery disease) 08/23/2018   DM (diabetes mellitus), type 2 ([place]) 08/23/2018   Pyuria 08/23/2018    Palliative Care Assessment & Plan   Patient Profile: 76 y.o. male  with past medical history of coronary artery disease, colon cancer, T2DM, cirrhosis, A-fib, portal hypertension, morbid obesity with chronic indwelling catheter presented to ED on 09/22/2021 from [place] with AMS. Per son patient recently tested positive for COVID at facility. Patient was admitted on 09/03/2021 with acute metabolic encephalopathy likely secondary from UTI vs COVID infection and cool left upper extremity. Patient's hospital course is now complicated by MSRA bacteremia and worsening encephalopathy in the setting of acute stroke. CCM states likelihood of patient surviving illness is low and if he did survive his chance at meaningful functional recovery is also low. Neurology states patient is not a candidate for CVA intervention.   Assessment: Acute metabolic encephalopathy MRSA bacteremia COVID infection History of cirrhosis Hypernatremia Chronic atrial fibrillation Chronic CHF Terminal care  Recommendations/Plan: Continue full comfort measures Continue DNR/DNI as previously documented Discharge to [facility] when bed available - per [facility] will likely be today Continue current comfort focused medication regimen  Nursing to provide frequent assessments and administer PRN medications as clinically necessary to ensure EOL comfort PMT will continue to follow and support holistically  Goals of Care and Additional Recommendations: Limitations on Scope of Treatment: Full Comfort Care  Code Status:    Code Status Orders  (From admission, onward)           Start     Ordered   09/25/21 1518  Do not attempt resuscitation (DNR)  Continuous       Question Answer Comment  In the event of cardiac or respiratory ARREST Do not call a  code blue   In the event of cardiac or respiratory ARREST Do not perform Intubation, CPR, defibrillation or  ACLS   In the event of cardiac or respiratory ARREST Use medication by any route, position, wound care, and other measures to relive pain and suffering. May use oxygen, suction and manual treatment of airway obstruction as needed for comfort.      09/25/21 1524           Code Status History     Date Active Date Inactive Code Status Order ID Comments User Context   08/28/2021 1729 09/25/2021 1524 DNR [id number]  Lajean Manes, MD ED   09/03/2021 1613 09/13/2021 1729 Full Code [id number]  Rick Duff, MD ED   03/21/2021 0711 03/24/2021 2016 DNR [id number]  Dessa Phi, DO Inpatient   03/20/2021 2201 03/21/2021 0711 Full Code [id number]  Mendel Corning, MD ED   03/20/2021 1950 03/20/2021 2201 DNR [id number]  Chotiner, Yevonne Aline, MD ED   03/15/2021 1723 03/19/2021 1934 DNR [id number]  Shawna Clamp, MD ED   03/15/2021 1723 03/15/2021 1723 Full Code [id number]  Shawna Clamp, MD ED   03/15/2021 1722 03/15/2021 1723 Full Code [id number]  Shawna Clamp, MD ED   02/03/2021 2247 02/12/2021 0206 DNR [id number]  Rhetta Mura, DO ED   06/06/2020 0652 06/08/2020 1854 DNR [id number]  Jean Rosenthal, MD ED   05/25/2020 1829 06/04/2020 2011 DNR [id number]  Madalyn Rob, MD ED   10/02/2019 2126 10/06/2019 2143 DNR [id number]  Bennie Pierini, MD Inpatient   09/16/2019 0831 09/19/2019 2047 DNR [id number]  Kathi Ludwig, MD ED   08/13/2019 1922 08/16/2019 1729 DNR [id number]  Bonnita Hollow, MD ED   08/13/2019 1922 08/13/2019 1922 Partial Code [id number]  Bonnita Hollow, MD ED   08/13/2019 1922 08/13/2019 1922 DNR [id number]  Bonnita Hollow, MD ED   09/29/2018 2250 10/08/2018 2301 DNR [id number]  Benay Pike, MD ED   08/23/2018 1929 08/25/2018 2043 DNR [id number]  Toy Baker, MD Inpatient       Prognosis:  Hours - Days  Discharge Planning: Hospice facility  Care plan was discussed with  primary RN, TOC, attending physician team, hospice liaison   Thank you for allowing the Palliative Medicine Team to assist in the care of this patient.   Lin Landsman, NP  Please contact Palliative Medicine Team phone at [phone] for questions and concerns.

## 2021-10-26 DEATH — deceased

## 2023-02-27 ENCOUNTER — Other Ambulatory Visit (HOSPITAL_BASED_OUTPATIENT_CLINIC_OR_DEPARTMENT_OTHER): Payer: Self-pay
